# Patient Record
Sex: Male | Born: 1950 | Race: White | Hispanic: No | Marital: Married | Smoking: Former smoker
Health system: Southern US, Community
[De-identification: ages and names within clinical notes are randomized; demographics above are authoritative.]

## PROBLEM LIST (undated history)

## (undated) DIAGNOSIS — C9110 Chronic lymphocytic leukemia of B-cell type not having achieved remission: Secondary | ICD-10-CM

## (undated) DIAGNOSIS — I2699 Other pulmonary embolism without acute cor pulmonale: Secondary | ICD-10-CM

## (undated) DIAGNOSIS — N400 Enlarged prostate without lower urinary tract symptoms: Secondary | ICD-10-CM

## (undated) DIAGNOSIS — I739 Peripheral vascular disease, unspecified: Secondary | ICD-10-CM

## (undated) DIAGNOSIS — I10 Essential (primary) hypertension: Secondary | ICD-10-CM

## (undated) DIAGNOSIS — Z950 Presence of cardiac pacemaker: Secondary | ICD-10-CM

---

## 2013-04-25 ENCOUNTER — Emergency Department (INDEPENDENT_AMBULATORY_CARE_PROVIDER_SITE_OTHER): Payer: 59

## 2013-04-25 ENCOUNTER — Emergency Department (HOSPITAL_COMMUNITY)
Admission: EM | Admit: 2013-04-25 | Discharge: 2013-04-25 | Disposition: A | Discharge status: Home / Self Care | Payer: 59 | Source: Home / Self Care | Attending: Family Medicine | Admitting: Family Medicine

## 2013-04-25 ENCOUNTER — Encounter (HOSPITAL_COMMUNITY): Payer: Self-pay | Admitting: Emergency Medicine

## 2013-04-25 DIAGNOSIS — J40 Bronchitis, not specified as acute or chronic: Secondary | ICD-10-CM

## 2013-04-25 DIAGNOSIS — R030 Elevated blood-pressure reading, without diagnosis of hypertension: Secondary | ICD-10-CM

## 2013-04-25 DIAGNOSIS — IMO0001 Reserved for inherently not codable concepts without codable children: Secondary | ICD-10-CM

## 2013-04-25 MED ORDER — PREDNISONE 50 MG PO TABS: 50.0000 mg | ORAL_TABLET | Freq: Every day | ORAL | Status: DC

## 2013-04-25 MED ORDER — ALBUTEROL SULFATE HFA 108 (90 BASE) MCG/ACT IN AERS
INHALATION_SPRAY | RESPIRATORY_TRACT | Status: AC
  Filled 2013-04-25: qty 6.7

## 2013-04-25 MED ORDER — ALBUTEROL SULFATE HFA 108 (90 BASE) MCG/ACT IN AERS
1.0000 | INHALATION_SPRAY | RESPIRATORY_TRACT | Status: DC | PRN
  Administered 2013-04-25: 2 via RESPIRATORY_TRACT

## 2013-04-25 MED ORDER — AZITHROMYCIN 250 MG PO TABS: 250.0000 mg | ORAL_TABLET | Freq: Every day | ORAL | Status: DC

## 2013-04-25 MED ORDER — GUAIFENESIN-CODEINE 100-10 MG/5ML PO SOLN: 5.0000 mL | Freq: Every evening | ORAL | Status: DC | PRN

## 2013-04-25 NOTE — ED Provider Notes (Addendum)
Douglas [name] is a 63 y.o. male who presents to Urgent Care today for 6-7 days of sinus congestion sore throat body aches and persistent nonproductive cough. The cough is quite bothersome at bedtime. Most of the symptoms have resolved with the exception of cough. He notes a mild shortness of breath as well. He denies any leg swelling palpitations chest pain fevers or chills. He feels well otherwise.   History reviewed. No pertinent past medical history. Patient does not have any medical problems but does not go to doctors. History  Substance Use Topics  . Smoking status: Not on file  . Smokeless tobacco: Not on file  . Alcohol Use: Not on file   currently a smoker. ROS as above Medications: Current Facility-Administered Medications  Medication Dose Route Frequency Provider Last Rate Last Dose  . albuterol (PROVENTIL HFA;VENTOLIN HFA) 108 (90 BASE) MCG/ACT inhaler 1-2 puff  1-2 puff Inhalation Q4H PRN Gregor Hams, MD   2 puff at 04/25/13 0910   Current Outpatient Prescriptions  Medication Sig Dispense Refill  . azithromycin (ZITHROMAX) 250 MG tablet Take 1 tablet (250 mg total) by mouth daily. Take first 2 tablets together, then 1 every day until finished.  6 tablet  0  . guaiFENesin-codeine 100-10 MG/5ML syrup Take 5 mLs by mouth at bedtime as needed for cough.  120 mL  0  . predniSONE (DELTASONE) 50 MG tablet Take 1 tablet (50 mg total) by mouth daily.  5 tablet  0    Exam:  BP 188/90  Pulse 92  Temp(Src) 98.7 F (37.1 C) (Oral)  Resp 18  SpO2 96% Gen: Well NAD HEENT: EOMI,  MMM posterior pharynx with cobblestoning. Tympanic membranes are normal appearing bilaterally Lungs: Normal work of breathing. CTABL no wheezing noted  Heart: RRR no MRG Abd: NABS, Soft. NT, ND Exts: Brisk capillary refill, warm and well perfused.   No results found for this or any previous visit (from the past 24 hour(s)). Dg Chest 2 View  04/25/2013   CLINICAL DATA:  Cough and sore throat with  congestion for 1 week, cough getting worse  EXAM: CHEST  2 VIEW  COMPARISON:  None.  FINDINGS: The heart size and vascular pattern are normal. The right lung is clear. Laterally in the left lower lobe there are mildly prominent well-defined, horizontally oriented interstitial markings. There are no pleural effusions. There is mild perihilar peribronchial wall thickening bilaterally. There is significant degenerative change involving the left shoulder. Right shoulder is not visualized.  IMPRESSION: Findings most consistent with bronchitis. Mild left lower lobe interstitial change is felt to most likely be related to atelectasis. Consider radiographic followup if there is reason to suspect developing pneumonia.   Electronically Signed   By: Skipper Cliche M.D.   On: 04/25/2013 08:48    Assessment and Plan: 63 y.o. male with bronchitis versus early pneumonia. Plan to treat with prednisone, azithromycin, containing cough medication, and albuterol. Will dispense albuterol inhaler in the clinic today prior to discharge and teach patient how to use it. Additionally recommend patient followup with his primary care provider for blood pressure management.  Discussed warning signs or symptoms. Please see discharge instructions. Patient expresses understanding.    Gregor Hams, MD 04/25/13 [place], MD 04/25/13 [phone]

## 2013-04-25 NOTE — Discharge Instructions (Signed)
Thank you for coming in today. Use albuterol as needed. Take prednisone daily for 5 days. Take azithromycin daily for 5 days. Use ibuprofen or Tylenol for pain fevers or chills. Call or go to the emergency room if you get worse, have trouble breathing, have chest pains, or palpitations.   Bronchitis Bronchitis is inflammation of the airways that extend from the windpipe into the lungs (bronchi). The inflammation often causes mucus to develop, which leads to a cough. If the inflammation becomes severe, it may cause shortness of breath. CAUSES  Bronchitis may be caused by:   Viral infections.   Bacteria.   Cigarette smoke.   Allergens, pollutants, and other irritants.  SIGNS AND SYMPTOMS  The most common symptom of bronchitis is a frequent cough that produces mucus. Other symptoms include:  Fever.   Body aches.   Chest congestion.   Chills.   Shortness of breath.   Sore throat.  DIAGNOSIS  Bronchitis is usually diagnosed through a medical history and physical exam. Tests, such as chest X-rays, are sometimes done to rule out other conditions.  TREATMENT  You may need to avoid contact with whatever caused the problem (smoking, for example). Medicines are sometimes needed. These may include:  Antibiotics. These may be prescribed if the condition is caused by bacteria.  Cough suppressants. These may be prescribed for relief of cough symptoms.   Inhaled medicines. These may be prescribed to help open your airways and make it easier for you to breathe.   Steroid medicines. These may be prescribed for those with recurrent (chronic) bronchitis. HOME CARE INSTRUCTIONS  Get plenty of rest.   Drink enough fluids to keep your urine clear or pale yellow (unless you have a medical condition that requires fluid restriction). Increasing fluids may help thin your secretions and will prevent dehydration.   Only take over-the-counter or prescription medicines as directed  by your health care provider.  Only take antibiotics as directed. Make sure you finish them even if you start to feel better.  Avoid secondhand smoke, irritating chemicals, and strong fumes. These will make bronchitis worse. If you are a smoker, quit smoking. Consider using nicotine gum or skin patches to help control withdrawal symptoms. Quitting smoking will help your lungs heal faster.   Put a cool-mist humidifier in your bedroom at night to moisten the air. This may help loosen mucus. Change the water in the humidifier daily. You can also run the hot water in your shower and sit in the bathroom with the door closed for 5 10 minutes.   Follow up with your health care provider as directed.   Wash your hands frequently to avoid catching bronchitis again or spreading an infection to others.  SEEK MEDICAL CARE IF: Your symptoms do not improve after 1 week of treatment.  SEEK IMMEDIATE MEDICAL CARE IF:  Your fever increases.  You have chills.   You have chest pain.   You have worsening shortness of breath.   You have bloody sputum.  You faint.  You have lightheadedness.  You have a severe headache.   You vomit repeatedly. MAKE SURE YOU:   Understand these instructions.  Will watch your condition.  Will get help right away if you are not doing well or get worse. Document Released: 03/28/2005 Document Revised: 01/16/2013 Document Reviewed: 11/20/2012 [facility] Patient Information 2014 [place].

## 2013-04-25 NOTE — ED Notes (Signed)
C/o cold sx for six days which has cleared up but the cough States he is SOB especially at night OTC medication taking but no relief.

## 2013-07-02 ENCOUNTER — Other Ambulatory Visit: Payer: Self-pay | Admitting: Family Medicine

## 2013-07-02 DIAGNOSIS — I10 Essential (primary) hypertension: Secondary | ICD-10-CM

## 2013-07-09 ENCOUNTER — Ambulatory Visit
Admission: RE | Admit: 2013-07-09 | Discharge: 2013-07-09 | Disposition: A | Discharge status: Home / Self Care | Payer: 59 | Source: Ambulatory Visit | Attending: Family Medicine | Admitting: Family Medicine

## 2013-07-09 DIAGNOSIS — I10 Essential (primary) hypertension: Secondary | ICD-10-CM

## 2013-07-18 ENCOUNTER — Emergency Department (HOSPITAL_COMMUNITY)
Admission: EM | Admit: 2013-07-18 | Discharge: 2013-07-18 | Disposition: A | Discharge status: Home / Self Care | Payer: 59 | Source: Home / Self Care | Attending: Emergency Medicine | Admitting: Emergency Medicine

## 2013-07-18 ENCOUNTER — Encounter (HOSPITAL_COMMUNITY): Payer: Self-pay | Admitting: Emergency Medicine

## 2013-07-18 DIAGNOSIS — J208 Acute bronchitis due to other specified organisms: Secondary | ICD-10-CM

## 2013-07-18 DIAGNOSIS — J209 Acute bronchitis, unspecified: Secondary | ICD-10-CM

## 2013-07-18 MED ORDER — IPRATROPIUM BROMIDE 0.06 % NA SOLN: 2.0000 | Freq: Four times a day (QID) | NASAL | Status: DC

## 2013-07-18 MED ORDER — ALBUTEROL SULFATE HFA 108 (90 BASE) MCG/ACT IN AERS: 2.0000 | INHALATION_SPRAY | Freq: Four times a day (QID) | RESPIRATORY_TRACT | Status: DC

## 2013-07-18 MED ORDER — HYDROCOD POLST-CHLORPHEN POLST 10-8 MG/5ML PO LQCR: 5.0000 mL | Freq: Two times a day (BID) | ORAL | Status: DC | PRN

## 2013-07-18 MED ORDER — PREDNISONE 20 MG PO TABS: 20.0000 mg | ORAL_TABLET | Freq: Two times a day (BID) | ORAL | Status: DC

## 2013-07-18 MED ORDER — AZITHROMYCIN 250 MG PO TABS: ORAL_TABLET | ORAL | Status: DC

## 2013-07-18 NOTE — Discharge Instructions (Signed)

## 2013-07-18 NOTE — ED Provider Notes (Signed)
Chief Complaint   Chief Complaint  Patient presents with  . Bronchitis    History of Present Illness   Douglas [name] is a 63 year old male who has had a two-day history of nonproductive cough, wheezing, shortness of breath, nasal congestion, headache, sinus pressure, ear congestion, postnasal drip, has felt somewhat warm, and had a sore throat. He denies any chest pain or GI symptoms. 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  Review of Systems   Other than as noted above, the patient denies any of the following symptoms: Systemic:  No fevers, chills, sweats, or myalgias. Eye:  No redness or discharge. ENT:  No ear pain, headache, nasal congestion, drainage, sinus pressure, or sore throat. Neck:  No neck pain, stiffness, or swollen glands. Lungs:  No cough, sputum production, hemoptysis, wheezing, chest tightness, shortness of breath or chest pain. GI:  No abdominal pain, nausea, vomiting or diarrhea.  [place]   Past medical history, family history, social history, meds, and allergies were reviewed. He takes several medicines for high blood pressure.  Physical exam   Vital signs:  BP 176/89  Pulse 90  Temp(Src) 98.2 F (36.8 C) (Oral)  Resp 16  SpO2 95% General:  Alert and oriented.  In no distress.  Skin warm and dry. Eye:  No conjunctival injection or drainage. Lids were normal. ENT:  TMs and canals were normal, without erythema or inflammation.  Nasal mucosa was clear and uncongested, without drainage.  Mucous membranes were moist.  Pharynx was clear with no exudate or drainage.  There were no oral ulcerations or lesions. Neck:  Supple, no adenopathy, tenderness or mass. Lungs:  No respiratory distress.  Lungs were clear to auscultation, without wheezes, rales or rhonchi.  Breath sounds were clear and equal bilaterally.  Heart:  Regular rhythm, without gallops,  murmers or rubs. Skin:  Clear, warm, and dry, without rash or lesions.  Assessment     The encounter diagnosis was Viral bronchitis.  No evidence of pneumonia.  Plan    1.  Meds:  The following meds were prescribed:   Discharge Medication List as of 07/18/2013  8:43 AM    START taking these medications   Details  albuterol (PROVENTIL HFA;VENTOLIN HFA) 108 (90 BASE) MCG/ACT inhaler Inhale 2 puffs into the lungs 4 (four) times daily., Starting 07/18/2013, Until Discontinued, Normal    !! azithromycin (ZITHROMAX Z-PAK) 250 MG tablet Take as directed., Print    chlorpheniramine-HYDROcodone (TUSSIONEX) 10-8 MG/5ML LQCR Take 5 mLs by mouth every 12 (twelve) hours as needed for cough., Starting 07/18/2013, Until Discontinued, Normal    ipratropium (ATROVENT) 0.06 % nasal spray Place 2 sprays into both nostrils 4 (four) times daily., Starting 07/18/2013, Until Discontinued, Normal    !! predniSONE (DELTASONE) 20 MG tablet Take 1 tablet (20 mg total) by mouth 2 (two) times daily., Starting 07/18/2013, Until Discontinued, Normal     !! - Potential duplicate medications found. Please discuss with provider.     The patient was told not to get the prescription for antibiotic filled unless his respiratory symptoms had persisted for more than 7 to 10 days.  2.  Patient Education/Counseling:  The patient was given appropriate handouts, self care instructions, and instructed in symptomatic relief.  Instructed to get extra fluids, rest, and use a cool mist vaporizer.    3.  Follow up:  The patient was told  to follow up here if no better in 3 to 4 days, or sooner if becoming worse in any way, and given some red flag symptoms such as increasing fever, difficulty breathing, chest pain, or persistent vomiting which would prompt immediate return.  Follow up here as needed.      Harden Mo, MD 07/18/13 [phone]

## 2013-07-18 NOTE — ED Notes (Signed)
C/o bronchitis States she has SOB, cough, sinus drainage and congestion States ear has discomfort down to neck Was here in January for same issue

## 2014-02-27 ENCOUNTER — Ambulatory Visit
Admission: RE | Admit: 2014-02-27 | Discharge: 2014-02-27 | Disposition: A | Discharge status: Home / Self Care | Payer: 59 | Source: Ambulatory Visit | Attending: Family Medicine | Admitting: Family Medicine

## 2014-02-27 ENCOUNTER — Other Ambulatory Visit: Payer: Self-pay | Admitting: Family Medicine

## 2014-02-27 DIAGNOSIS — R059 Cough, unspecified: Secondary | ICD-10-CM

## 2014-02-27 DIAGNOSIS — R05 Cough: Secondary | ICD-10-CM

## 2016-07-12 ENCOUNTER — Encounter: Payer: Self-pay | Admitting: Podiatry

## 2016-07-12 ENCOUNTER — Ambulatory Visit (INDEPENDENT_AMBULATORY_CARE_PROVIDER_SITE_OTHER): Payer: 59 | Admitting: Podiatry

## 2016-07-12 ENCOUNTER — Ambulatory Visit: Payer: 59

## 2016-07-12 VITALS — BP 145/72 | HR 81 | Resp 16 | Ht 72.0 in | Wt 220.0 lb

## 2016-07-12 DIAGNOSIS — M779 Enthesopathy, unspecified: Secondary | ICD-10-CM

## 2016-07-12 DIAGNOSIS — L84 Corns and callosities: Secondary | ICD-10-CM

## 2016-07-12 DIAGNOSIS — M79672 Pain in left foot: Secondary | ICD-10-CM

## 2016-07-12 MED ORDER — TRIAMCINOLONE ACETONIDE 10 MG/ML IJ SUSP
10.0000 mg | Freq: Once | INTRAMUSCULAR | Status: AC
  Administered 2016-07-12: 10 mg

## 2016-07-12 NOTE — Progress Notes (Signed)
   Subjective:    Patient ID: Douglas [name], male    DOB: 1950/06/25, 66 y.o.   MRN: [id number]  HPI Chief Complaint  Patient presents with  . Painful lesion    Left foot; midfoot-lateral side; x4-6 weeks      Review of Systems  HENT: Positive for sinus pain.   Eyes: Positive for redness and itching.  Respiratory: Positive for shortness of breath.   Genitourinary: Positive for difficulty urinating, frequency and urgency.  Musculoskeletal: Positive for back pain and myalgias.  Neurological: Positive for light-headedness.  All other systems reviewed and are negative.      Objective:   Physical Exam        Assessment & Plan:

## 2016-07-18 NOTE — Progress Notes (Signed)
Subjective:     Patient ID: Douglas [name], male   DOB: 1951/01/05, 66 y.o.   MRN: [id number]  HPI patient presents stating she's getting a lot of pain at the base of her fifth metatarsal left foot making it hard to walk comfortably   Review of Systems  All other systems reviewed and are negative.      Objective:   Physical Exam  Constitutional: He is oriented to person, place, and time.  Cardiovascular: Intact distal pulses.   Musculoskeletal: Normal range of motion.  Neurological: He is oriented to person, place, and time.  Skin: Skin is warm.  Nursing note and vitals reviewed.  neurovascular status intact muscle strength adequate range of motion within normal limits with patient noted to have pain in the base of the fifth metatarsal left with inflammation fluid noted. There is also keratotic lesion with deep type lucent core that is very painful when pressed and patient is noted to have good digital perfusion and is well oriented 3     Assessment:     Inflammatory capsulitis base of fifth metatarsal left with porokeratotic type lesion    Plan:     H&P condition reviewed and treatment rendered. Today I went ahead and I did a careful steroidal injection base of fifth metatarsal to reduce the inflammatory process and after appropriate numbness debrided the lesion fully and applied padding. Reappoint as symptoms indicate  X-rays indicate that there is no signs of bone pathology appears to be soft tissue

## 2018-11-12 ENCOUNTER — Encounter: Payer: Self-pay | Admitting: Oncology

## 2018-11-12 ENCOUNTER — Telehealth: Payer: Self-pay | Admitting: Oncology

## 2018-11-12 NOTE — Telephone Encounter (Signed)
Received a new hem referral from Dr. Orland Mustard for lymphocytosis and thrombocytopenia. Pt has been cld and scheduled to see Dr. Alen Blew on 8/19 at 11am. Letter mailed.

## 2018-11-28 ENCOUNTER — Telehealth: Payer: Self-pay | Admitting: Oncology

## 2018-11-28 ENCOUNTER — Inpatient Hospital Stay: Payer: 59 | Attending: Oncology | Admitting: Oncology

## 2018-11-28 ENCOUNTER — Other Ambulatory Visit: Payer: Self-pay

## 2018-11-28 ENCOUNTER — Inpatient Hospital Stay: Payer: 59

## 2018-11-28 VITALS — BP 141/81 | HR 97 | Temp 98.9°F | Resp 18 | Ht 72.0 in | Wt 216.5 lb

## 2018-11-28 DIAGNOSIS — D72820 Lymphocytosis (symptomatic): Secondary | ICD-10-CM | POA: Insufficient documentation

## 2018-11-28 DIAGNOSIS — Z87891 Personal history of nicotine dependence: Secondary | ICD-10-CM | POA: Diagnosis not present

## 2018-11-28 DIAGNOSIS — D72829 Elevated white blood cell count, unspecified: Secondary | ICD-10-CM

## 2018-11-28 DIAGNOSIS — I10 Essential (primary) hypertension: Secondary | ICD-10-CM | POA: Diagnosis not present

## 2018-11-28 DIAGNOSIS — Z806 Family history of leukemia: Secondary | ICD-10-CM | POA: Diagnosis not present

## 2018-11-28 DIAGNOSIS — D696 Thrombocytopenia, unspecified: Secondary | ICD-10-CM | POA: Diagnosis not present

## 2018-11-28 LAB — CMP (CANCER CENTER ONLY)
ALT: 21 U/L (ref 0–44)
AST: 13 U/L — ABNORMAL LOW (ref 15–41)
Albumin: 4.2 g/dL (ref 3.5–5.0)
Alkaline Phosphatase: 81 U/L (ref 38–126)
Anion gap: 12 (ref 5–15)
BUN: 19 mg/dL (ref 8–23)
CO2: 25 mmol/L (ref 22–32)
Calcium: 9.3 mg/dL (ref 8.9–10.3)
Chloride: 103 mmol/L (ref 98–111)
Creatinine: 0.85 mg/dL (ref 0.61–1.24)
GFR, Est AFR Am: >60 mL/min (ref >60)
GFR, Estimated: >60 mL/min (ref >60)
Glucose, Bld: 95 mg/dL (ref 70–99)
Potassium: 3.7 mmol/L (ref 3.5–5.1)
Sodium: 140 mmol/L (ref 135–145)
Total Bilirubin: 0.3 mg/dL (ref 0.3–1.2)
Total Protein: 7.1 g/dL (ref 6.5–8.1)

## 2018-11-28 LAB — CBC WITH DIFFERENTIAL (CANCER CENTER ONLY)
Abs Immature Granulocytes: 0.05 10*3/uL (ref 0.00–0.07)
Basophils Absolute: 0.1 10*3/uL (ref 0.0–0.1)
Basophils Relative: 0 %
Eosinophils Absolute: 0.1 10*3/uL (ref 0.0–0.5)
Eosinophils Relative: 0 %
HCT: 43 % (ref 39.0–52.0)
Hemoglobin: 14.1 g/dL (ref 13.0–17.0)
Immature Granulocytes: 0 %
Lymphocytes Relative: 76 %
Lymphs Abs: 18.5 10*3/uL — ABNORMAL HIGH (ref 0.7–4.0)
MCH: 30.1 pg (ref 26.0–34.0)
MCHC: 32.8 g/dL (ref 30.0–36.0)
MCV: 91.9 fL (ref 80.0–100.0)
Monocytes Absolute: 1.8 10*3/uL — ABNORMAL HIGH (ref 0.1–1.0)
Monocytes Relative: 7 %
Neutro Abs: 4.3 10*3/uL (ref 1.7–7.7)
Neutrophils Relative %: 17 %
Platelet Count: 139 10*3/uL — ABNORMAL LOW (ref 150–400)
RBC: 4.68 MIL/uL (ref 4.22–5.81)
RDW: 17.7 % — ABNORMAL HIGH (ref 11.5–15.5)
WBC Count: 24.9 10*3/uL — ABNORMAL HIGH (ref 4.0–10.5)
nRBC: 0 % (ref 0.0–0.2)

## 2018-11-28 NOTE — Progress Notes (Signed)
Reason for the request:   Lymphocytosis and lymphadenopathy  HPI: I was asked by Dr. Orland Mustard to evaluate Douglas [name] for lymphocytosis.  He is a 68 year old man with history of hypertension and allergies but otherwise in reasonable health and shape.  He has a routine follow-up with Dr. Orland Mustard for physical and recently noted to have cervical adenopathy.  Upon further evaluation a CBC obtained on October 30, 2018 which showed a white cell count of 18,000 with lymphocytosis lymphocyte percentage of 74%.  His platelets were at 105 and normal hemoglobin.  The differential showed neutrophil percentage 24%.  He denies any specific symptoms related to these findings.  He denies any other painful adenopathy, constitutional symptoms including fevers chills or night sweats.  He denies any decline in his energy fatigue.  He denies any weight loss or early satiety.  He remains active and is able to attend to activities of daily living.  He denies any previous hospitalizations or illnesses.  He does have seasonal allergies that have been manageable for the most part.  He does not report any headaches, blurry vision, syncope or seizures. Does not report any fevers, chills or sweats.  Does not report any cough, wheezing or hemoptysis.  Does not report any chest pain, palpitation, orthopnea or leg edema.  Does not report any nausea, vomiting or abdominal pain.  Does not report any constipation or diarrhea.  Does not report any skeletal complaints.    Does not report frequency, urgency or hematuria.  Does not report any skin rashes or lesions. Does not report any heat or cold intolerance.  Does not report any lymphadenopathy or petechiae.  Does not report any anxiety or depression.  Remaining review of systems is negative.    Medical history significant for hypertension allergies.  No history of diabetes, coronary disease or malignancy.  Current Outpatient Medications:  .  albuterol (PROVENTIL HFA;VENTOLIN HFA) 108 (90  BASE) MCG/ACT inhaler, Inhale 2 puffs into the lungs 4 (four) times daily., Disp: 1 Inhaler, Rfl: 0 .  amLODipine (NORVASC) 5 MG tablet, Take 5 mg by mouth daily., Disp: , Rfl:  .  azelastine (ASTELIN) 0.1 % nasal spray, Place 1 spray into both nostrils as needed for rhinitis. Use in each nostril as directed, Disp: , Rfl:  .  loratadine (CLARITIN) 10 MG tablet, Take 10 mg by mouth daily., Disp: , Rfl:  .  montelukast (SINGULAIR) 10 MG tablet, Take 10 mg by mouth as needed., Disp: , Rfl:  .  terazosin (HYTRIN) 10 MG capsule, Take 10 mg by mouth at bedtime., Disp: , Rfl:  .  valsartan-hydrochlorothiazide (DIOVAN-HCT) 320-25 MG tablet, Take 1 tablet by mouth daily., Disp: , Rfl: :  No Known Allergies:   No family history of malignancy.  His grandfather had leukemia although specifics are unclear. Social History   Socioeconomic History  . Marital status: Married    Spouse name: Not on file  . Number of children: Not on file  . Years of education: Not on file  . Highest education level: Not on file  Occupational History  . Not on file  Social Needs  . Financial resource strain: Not on file  . Food insecurity    Worry: Not on file    Inability: Not on file  . Transportation needs    Medical: Not on file    Non-medical: Not on file  Tobacco Use  . Smoking status: Former Research scientist (life sciences)  . Smokeless tobacco: Never Used  Substance and Sexual Activity  .  Alcohol use: Not on file  . Drug use: Not on file  . Sexual activity: Not on file  Lifestyle  . Physical activity    Days per week: Not on file    Minutes per session: Not on file  . Stress: Not on file  Relationships  . Social Herbalist on phone: Not on file    Gets together: Not on file    Attends religious service: Not on file    Active member of club or organization: Not on file    Attends meetings of clubs or organizations: Not on file    Relationship status: Not on file  . Intimate partner violence    Fear of current  or ex partner: Not on file    Emotionally abused: Not on file    Physically abused: Not on file    Forced sexual activity: Not on file  Other Topics Concern  . Not on file  Social History Narrative  . Not on file  :  Pertinent items are noted in HPI.  Exam: Blood pressure (!) 141/81, pulse 97, temperature 98.9 F (37.2 C), temperature source Temporal, resp. rate 18, height 6' (1.829 m), weight 216 lb 8 oz (98.2 kg), SpO2 98 %.  ECOG 0 General appearance: alert and cooperative appeared without distress. Head: atraumatic without any abnormalities. Eyes: conjunctivae/corneas clear. PERRL.  Sclera anicteric. Throat: lips, mucosa, and tongue normal; without oral thrush or ulcers. Resp: clear to auscultation bilaterally without rhonchi, wheezes or dullness to percussion. Cardio: regular rate and rhythm, S1, S2 normal, no murmur, click, rub or gallop GI: soft, non-tender; bowel sounds normal; no masses,  no organomegaly Skin: Skin color, texture, turgor normal. No rashes or lesions Lymph nodes: Bilateral enlarged cervical adenopathy palpated.  No axillary, inguinal or supraclavicular lymph nodes. Neurologic: Grossly normal without any motor, sensory or deep tendon reflexes. Musculoskeletal: No joint deformity or effusion.  Recent Labs    11/28/18 1119  WBC 24.9*  HGB 14.1  HCT 43.0  PLT 139*     Assessment and Plan:   68 year old with:  1.  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  He was found to have white cell count of 18,000 with lymphocytosis and thrombocytopenia with normal hemoglobin.  Repeat CBC today showed white cell count of 24.9 with hemoglobin of 14 and platelet count of 139.  The differential diagnosis was discussed today with the patient and lymphoproliferative disorder appears to be the most likely diagnosis.  The most common condition would be CLL although other lymphoproliferative disorders are considered possibilities as well.  These would  include mantle cell lymphoma among others.  To complete the evaluation I will obtain a peripheral blood flow cytometry as well as a CLL panel in case we are dealing with this condition for prognostication and treatment purposes.  The need for CT scan to complete the staging purposes was discussed today.  CT scan will give [place] a better understanding of the extent of his lymphadenopathy and the bulk of the disease.  The need for bone marrow biopsy could also arise as well.  After discussion today, he is agreeable to proceed with laboratory work-up today but would like to defer CT scan for the time being.  I will communicate with him the results of the initial blood testing as well as the need for subsequent imaging studies.  I continue to emphasize the importance of obtaining imaging studies however to complete the staging.  Treatment options for  this condition will be discussed further once diagnosis is formally made.  We did discuss treatment options for CLL in case that we are dealing with.  Statistically remains the most common lymphoproliferative disorder.  Treatment options include systemic chemotherapy, immunotherapy as well as oral targeted therapy with ibrutinib will also reviewed.  Complications with these treatments were also reviewed.  2.  Thrombocytopenia: Appears to be mild and not symptomatic.  He has no active bleeding noted at this time.  Thrombocytopenia is likely related to a lymphoproliferative disorder does not require treatment at the current rate.  3.  Follow-up: Will be determined pending the results of his work-up.  60  minutes was spent with the patient face-to-face today.  More than 50% of time was spent on reviewing laboratory data, differential diagnosis, management options and future plan of care..    Thank you for the referral.  A copy of this consult has been forwarded to the requesting physician.

## 2018-11-28 NOTE — Telephone Encounter (Signed)
Gave avs and calendar ° °

## 2018-11-30 LAB — FLOW CYTOMETRY

## 2018-12-14 LAB — FISH,CLL PROGNOSTIC PANEL

## 2018-12-24 ENCOUNTER — Inpatient Hospital Stay (HOSPITAL_BASED_OUTPATIENT_CLINIC_OR_DEPARTMENT_OTHER): Payer: 59 | Admitting: Oncology

## 2018-12-24 ENCOUNTER — Inpatient Hospital Stay: Payer: 59 | Attending: Oncology

## 2018-12-24 ENCOUNTER — Other Ambulatory Visit: Payer: Self-pay

## 2018-12-24 VITALS — BP 133/70 | HR 85 | Temp 98.2°F | Resp 18 | Ht 72.0 in | Wt 222.6 lb

## 2018-12-24 DIAGNOSIS — C9110 Chronic lymphocytic leukemia of B-cell type not having achieved remission: Secondary | ICD-10-CM | POA: Insufficient documentation

## 2018-12-24 DIAGNOSIS — D72829 Elevated white blood cell count, unspecified: Secondary | ICD-10-CM | POA: Diagnosis not present

## 2018-12-24 DIAGNOSIS — D696 Thrombocytopenia, unspecified: Secondary | ICD-10-CM | POA: Diagnosis not present

## 2018-12-24 LAB — CBC WITH DIFFERENTIAL (CANCER CENTER ONLY)
Abs Immature Granulocytes: 0.07 10*3/uL (ref 0.00–0.07)
Basophils Absolute: 0.1 10*3/uL (ref 0.0–0.1)
Basophils Relative: 0 %
Eosinophils Absolute: 0.2 10*3/uL (ref 0.0–0.5)
Eosinophils Relative: 1 %
HCT: 40.8 % (ref 39.0–52.0)
Hemoglobin: 13.5 g/dL (ref 13.0–17.0)
Immature Granulocytes: 0 %
Lymphocytes Relative: 77 %
Lymphs Abs: 22.1 10*3/uL — ABNORMAL HIGH (ref 0.7–4.0)
MCH: 30.3 pg (ref 26.0–34.0)
MCHC: 33.1 g/dL (ref 30.0–36.0)
MCV: 91.7 fL (ref 80.0–100.0)
Monocytes Absolute: 1.8 10*3/uL — ABNORMAL HIGH (ref 0.1–1.0)
Monocytes Relative: 6 %
Neutro Abs: 4.5 10*3/uL (ref 1.7–7.7)
Neutrophils Relative %: 16 %
Platelet Count: 113 10*3/uL — ABNORMAL LOW (ref 150–400)
RBC: 4.45 MIL/uL (ref 4.22–5.81)
RDW: 18.3 % — ABNORMAL HIGH (ref 11.5–15.5)
WBC Count: 28.8 10*3/uL — ABNORMAL HIGH (ref 4.0–10.5)
nRBC: 0 % (ref 0.0–0.2)

## 2018-12-24 NOTE — Progress Notes (Signed)
Hematology and Oncology Follow Up Visit  Douglas [name] [id number] 1950-08-04 68 y.o. 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  He presented with lymphadenopathy and lymphocytosis.  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   Current therapy: Under consideration for therapy versus observation.  Interim History: Douglas [name] returns today for a follow-up visit.  Since the last visit, he reports no major changes in his health.  He denies any recent hospitalization or illnesses.  He denies any nausea or abdominal pain.  He denies any bleeding complications.  Patient denied any alteration mental status, neuropathy, confusion or dizziness.  Denies any headaches or lethargy.  Denies any night sweats, weight loss or changes in appetite.  Denied orthopnea, dyspnea on exertion or chest discomfort.  Denies shortness of breath, difficulty breathing hemoptysis or cough.  Denies any abdominal distention, nausea, early satiety or dyspepsia.  Denies any hematuria, frequency, dysuria or nocturia.  Denies any skin irritation, dryness or rash.  Denies any ecchymosis or petechiae.  Denies any lymphadenopathy or clotting.  Denies any heat or cold intolerance.  Denies any anxiety or depression.  Remaining review of system is negative.        Medications: I have reviewed the patient's current medications.  Current Outpatient Medications  Medication Sig Dispense Refill  . albuterol (PROVENTIL HFA;VENTOLIN HFA) 108 (90 BASE) MCG/ACT inhaler Inhale 2 puffs into the lungs 4 (four) times daily. 1 Inhaler 0  . amLODipine (NORVASC) 5 MG tablet Take 5 mg by mouth daily.    [name] azelastine (ASTELIN) 0.1 % nasal spray Place 1 spray into both nostrils as needed for rhinitis. Use in each nostril as directed    .  loratadine (CLARITIN) 10 MG tablet Take 10 mg by mouth daily.    . montelukast (SINGULAIR) 10 MG tablet Take 10 mg by mouth as needed.    . terazosin (HYTRIN) 10 MG capsule Take 10 mg by mouth at bedtime.    . valsartan-hydrochlorothiazide (DIOVAN-HCT) 320-25 MG tablet Take 1 tablet by mouth daily.     No current facility-administered medications for this visit.      Allergies: No Known Allergies  Past Medical History, Surgical history, Social history, and Family History were reviewed and updated.    Physical Exam: Blood pressure 133/70, pulse 85, temperature 98.2 F (36.8 C), temperature source Oral, resp. rate 18, height 6' (1.829 m), weight 222 lb 9.6 oz (101 kg), SpO2 99 %.   ECOG: 0   General appearance: Comfortable appearing without any discomfort Head: Normocephalic without any trauma Oropharynx: Mucous membranes are moist and pink without any thrush or ulcers. Eyes: Pupils are equal and round reactive to light. Lymph nodes:  Bilateral cervical adenopathy unchanged. Heart:regular rate and rhythm.  S1 and S2 without leg edema. Lung: Clear without any rhonchi or wheezes.  No dullness to percussion. Abdomin: Soft, nontender, nondistended with good bowel sounds.  No hepatosplenomegaly. Musculoskeletal: No joint deformity or effusion.  Full range of motion noted. Neurological: No deficits noted on motor, sensory and deep tendon reflex exam. Skin: No petechial rash or dryness.  Appeared moist.      Lab Results: Lab Results  Component Value Date   WBC 24.9 (H) 11/28/2018   HGB 14.1 11/28/2018   HCT 43.0 11/28/2018   MCV 91.9 11/28/2018   PLT 139 (L)  11/28/2018     Chemistry      Component Value Date/Time   NA 140 11/28/2018 1119   K 3.7 11/28/2018 1119   CL 103 11/28/2018 1119   CO2 25 11/28/2018 1119   BUN 19 11/28/2018 1119   CREATININE 0.85 11/28/2018 1119      Component Value Date/Time   CALCIUM 9.3 11/28/2018 1119   ALKPHOS 81 11/28/2018 1119   AST 13  (L) 11/28/2018 1119   ALT 21 11/28/2018 1119   BILITOT 0.3 11/28/2018 1119      Impression and Plan:   68 year old with:  1.  CLL diagnosed in August 2020 after presenting with cervical adenopathy and lymphocytosis.  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  The natural course of this disease as well as treatment options were reviewed.  This would include active surveillance, single agent ibrutinib, combination with chemoimmunotherapy among other options were reviewed.  Risks and benefits of ibrutinib therapy was reviewed.  Potential complications include bleeding, cardiac arrhythmia bruising among others were reviewed.  After discussion today, we have elected to continue to hold off any treatment for the immediate future and monitor closely.  Will have low threshold of treating him.   2.  Thrombocytopenia: His platelet count is mildly low and asymptomatic.  We will continue to monitor in the setting of CLL.  He would be susceptible to developing immune thrombocytopenia.  3.  Follow-up: In 4 weeks for repeat evaluation.   25  minutes was spent with the patient face-to-face today.  More than 50% of time was spent on reviewing laboratory data, differential diagnosis and management options.   [name], MD 9/14/20209:24 AM

## 2018-12-25 ENCOUNTER — Telehealth: Payer: Self-pay | Admitting: Oncology

## 2018-12-25 NOTE — Telephone Encounter (Signed)
Called and spoke with patient. Confirmed appt  °

## 2019-01-08 ENCOUNTER — Telehealth: Payer: Self-pay | Admitting: Oncology

## 2019-01-08 NOTE — Telephone Encounter (Signed)
Patient called to reschedule 10/19 lab/fu to late December.  Confirmed new lab/fu for 12/29.

## 2019-01-28 ENCOUNTER — Other Ambulatory Visit: Payer: 59

## 2019-01-28 ENCOUNTER — Ambulatory Visit: Payer: 59 | Admitting: Oncology

## 2019-04-09 ENCOUNTER — Other Ambulatory Visit: Payer: Self-pay

## 2019-04-09 ENCOUNTER — Inpatient Hospital Stay: Payer: 59 | Attending: Oncology

## 2019-04-09 ENCOUNTER — Inpatient Hospital Stay (HOSPITAL_BASED_OUTPATIENT_CLINIC_OR_DEPARTMENT_OTHER): Payer: 59 | Admitting: Oncology

## 2019-04-09 VITALS — BP 131/71 | HR 91 | Temp 97.4°F | Resp 18 | Wt 222.6 lb

## 2019-04-09 DIAGNOSIS — C9110 Chronic lymphocytic leukemia of B-cell type not having achieved remission: Secondary | ICD-10-CM | POA: Insufficient documentation

## 2019-04-09 DIAGNOSIS — D6959 Other secondary thrombocytopenia: Secondary | ICD-10-CM | POA: Insufficient documentation

## 2019-04-09 DIAGNOSIS — D72829 Elevated white blood cell count, unspecified: Secondary | ICD-10-CM

## 2019-04-09 LAB — CMP (CANCER CENTER ONLY)
ALT: 17 U/L (ref 0–44)
AST: 10 U/L — ABNORMAL LOW (ref 15–41)
Albumin: 4 g/dL (ref 3.5–5.0)
Alkaline Phosphatase: 75 U/L (ref 38–126)
Anion gap: 12 (ref 5–15)
BUN: 17 mg/dL (ref 8–23)
CO2: 26 mmol/L (ref 22–32)
Calcium: 9.1 mg/dL (ref 8.9–10.3)
Chloride: 100 mmol/L (ref 98–111)
Creatinine: 0.93 mg/dL (ref 0.61–1.24)
GFR, Est AFR Am: >60 mL/min (ref >60)
GFR, Estimated: >60 mL/min (ref >60)
Glucose, Bld: 146 mg/dL — ABNORMAL HIGH (ref 70–99)
Potassium: 4.3 mmol/L (ref 3.5–5.1)
Sodium: 138 mmol/L (ref 135–145)
Total Bilirubin: 0.4 mg/dL (ref 0.3–1.2)
Total Protein: 6.9 g/dL (ref 6.5–8.1)

## 2019-04-09 LAB — CBC WITH DIFFERENTIAL (CANCER CENTER ONLY)
Abs Immature Granulocytes: 0 10*3/uL (ref 0.00–0.07)
Basophils Absolute: 0 10*3/uL (ref 0.0–0.1)
Basophils Relative: 0 %
Eosinophils Absolute: 0.3 10*3/uL (ref 0.0–0.5)
Eosinophils Relative: 1 %
HCT: 42.1 % (ref 39.0–52.0)
Hemoglobin: 13.8 g/dL (ref 13.0–17.0)
Lymphocytes Relative: 86 %
Lymphs Abs: 26.7 10*3/uL — ABNORMAL HIGH (ref 0.7–4.0)
MCH: 30.7 pg (ref 26.0–34.0)
MCHC: 32.8 g/dL (ref 30.0–36.0)
MCV: 93.6 fL (ref 80.0–100.0)
Monocytes Absolute: 0.3 10*3/uL (ref 0.1–1.0)
Monocytes Relative: 1 %
Neutro Abs: 3.7 10*3/uL (ref 1.7–7.7)
Neutrophils Relative %: 12 %
Platelet Count: 102 10*3/uL — ABNORMAL LOW (ref 150–400)
RBC: 4.5 MIL/uL (ref 4.22–5.81)
RDW: 17.8 % — ABNORMAL HIGH (ref 11.5–15.5)
WBC Count: 31 10*3/uL — ABNORMAL HIGH (ref 4.0–10.5)
nRBC: 0 % (ref 0.0–0.2)

## 2019-04-09 NOTE — Progress Notes (Signed)
Hematology and Oncology Follow Up Visit  Douglas [name] [id number] Nov 23, 1950 68 y.o. 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   Current therapy: Active surveillance.  Interim History: Douglas [name] is here for return evaluation.  Since the last visit, he reports no major changes in his health.  He denies any abdominal pain, nausea or weight loss.  His performance status and quality of life remains unchanged.  He does report cervical adenopathy which has not changed.  Denies any dysphagia or swallowing difficulties.  Denies any excessive fatigue or tiredness.  He denied headaches, blurry vision, syncope or seizures.  Denies any fevers, chills or sweats.  Denied chest pain, palpitation, orthopnea or leg edema.  Denied cough, wheezing or hemoptysis.  Denied nausea, vomiting or abdominal pain.  Denies any constipation or diarrhea.  Denies any frequency urgency or hesitancy.  Denies any arthralgias or myalgias.  Denies any skin rashes or lesions.  Denies any bleeding or clotting tendency.  Denies any easy bruising.  Denies any hair or nail changes.  Denies any anxiety or depression.  Remaining review of system is negative.          Medications: Updated without any changes. Current Outpatient Medications  Medication Sig Dispense Refill  . albuterol (PROVENTIL HFA;VENTOLIN HFA) 108 (90 BASE) MCG/ACT inhaler Inhale 2 puffs into the lungs 4 (four) times daily. 1 Inhaler 0  . amLODipine (NORVASC) 5 MG tablet Take 5 mg by mouth daily.    [name] azelastine (ASTELIN) 0.1 % nasal spray Place 1 spray into both nostrils as needed for rhinitis. Use in each nostril as directed    . loratadine (CLARITIN) 10 MG tablet Take 10 mg by mouth daily.    . montelukast (SINGULAIR) 10 MG  tablet Take 10 mg by mouth as needed.    . terazosin (HYTRIN) 10 MG capsule Take 10 mg by mouth at bedtime.    . valsartan-hydrochlorothiazide (DIOVAN-HCT) 320-25 MG tablet Take 1 tablet by mouth daily.     No current facility-administered medications for this visit.     Allergies: No Known Allergies  Past Medical History, Surgical history, Social history, and Family History reviewed without any changes.    Physical Exam:  Blood pressure 131/71, pulse 91, temperature (!) 97.4 F (36.3 C), temperature source Temporal, resp. rate 18, weight 222 lb 9.6 oz (101 kg), SpO2 96 %.    ECOG: 0     General appearance: Alert, awake without any distress. Head: Atraumatic without abnormalities Oropharynx: Without any thrush or ulcers. Eyes: No scleral icterus. Lymph nodes: No lymphadenopathy noted in the cervical, supraclavicular, or axillary nodes Heart:regular rate and rhythm, without any murmurs or gallops.   Lung: Clear to auscultation without any rhonchi, wheezes or dullness to percussion. Abdomin: Soft, nontender without any shifting dullness or ascites. Musculoskeletal: No clubbing or cyanosis. Neurological: No motor or sensory deficits. Skin: No rashes or lesions. Psychiatric: Mood and affect appeared normal.      Lab Results: Lab Results  Component Value Date   WBC 31.0 (H) 04/09/2019   HGB 13.8 04/09/2019   HCT 42.1 04/09/2019   MCV 93.6 04/09/2019   PLT 102 (L) 04/09/2019     Chemistry      Component Value Date/Time   NA 140 11/28/2018 1119   K 3.7 11/28/2018  1119   CL 103 11/28/2018 1119   CO2 25 11/28/2018 1119   BUN 19 11/28/2018 1119   CREATININE 0.85 11/28/2018 1119      Component Value Date/Time   CALCIUM 9.3 11/28/2018 1119   ALKPHOS 81 11/28/2018 1119   AST 13 (L) 11/28/2018 1119   ALT 21 11/28/2018 1119   BILITOT 0.3 11/28/2018 1119      Impression and Plan:   68 year old with:  1.  CLL presented with lymphocytosis and lymphadenopathy  in August 2020.  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  He remains on active surveillance at this time without any indication for treatment and symptoms.  Laboratory data from today showed white cell count is not dramatically different than September 2020.  Risks and benefits of initiating treatment was discussed today.  These options include oral targeted therapy with ibrutinib versus chemotherapy.  Indication for treatment would include rapid lymphocytosis rise, painful adenopathy, bone marrow failure with cytopenias among others including constitutional symptoms.    Complication related to active therapy for CLL reviewed.  Complication related to ibrutinib that include bruising, bleeding, cardiac toxicities.  Nausea vomiting myelosuppression associated with chemotherapy.  After discussion today, he opted to continue with active surveillance although he understands risk of developing symptomatic disease is high and at that time he will require immediate treatment.   2.  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  No active bleeding noted at this time.  3.  Follow-up: In 3 months for repeat evaluation.   25  minutes was spent with the patient face-to-face today.  More than 50% of time was dedicated to updating his disease status, reviewing laboratory data, indication for treatment and complication related to therapy.   [name], MD 12/29/20208:03 AM

## 2019-04-10 ENCOUNTER — Telehealth: Payer: Self-pay | Admitting: Oncology

## 2019-04-10 NOTE — Telephone Encounter (Signed)
Scheduled appt per 12/29 los.  Sent a message to HIM pool to get a calendar mailed out. 

## 2019-05-10 ENCOUNTER — Telehealth: Payer: Self-pay | Admitting: *Deleted

## 2019-05-10 NOTE — Telephone Encounter (Signed)
Records faxed to [place] to att Dr. Orland Mustard - release [id number]

## 2019-07-09 ENCOUNTER — Other Ambulatory Visit: Payer: 59

## 2019-07-09 ENCOUNTER — Ambulatory Visit: Payer: 59 | Admitting: Oncology

## 2019-11-20 ENCOUNTER — Inpatient Hospital Stay: Payer: 59 | Attending: Oncology

## 2019-11-20 ENCOUNTER — Telehealth: Payer: Self-pay | Admitting: Pharmacist

## 2019-11-20 ENCOUNTER — Telehealth: Payer: Self-pay | Admitting: Oncology

## 2019-11-20 ENCOUNTER — Telehealth: Payer: Self-pay | Admitting: Pharmacy Technician

## 2019-11-20 ENCOUNTER — Other Ambulatory Visit: Payer: Self-pay

## 2019-11-20 ENCOUNTER — Inpatient Hospital Stay (HOSPITAL_BASED_OUTPATIENT_CLINIC_OR_DEPARTMENT_OTHER): Payer: 59 | Admitting: Oncology

## 2019-11-20 VITALS — BP 129/68 | HR 58 | Temp 97.3°F | Resp 18 | Ht 72.0 in | Wt 217.4 lb

## 2019-11-20 DIAGNOSIS — C9110 Chronic lymphocytic leukemia of B-cell type not having achieved remission: Secondary | ICD-10-CM | POA: Insufficient documentation

## 2019-11-20 DIAGNOSIS — D72829 Elevated white blood cell count, unspecified: Secondary | ICD-10-CM

## 2019-11-20 DIAGNOSIS — D6959 Other secondary thrombocytopenia: Secondary | ICD-10-CM | POA: Diagnosis not present

## 2019-11-20 LAB — CMP (CANCER CENTER ONLY)
ALT: 10 U/L (ref 0–44)
AST: 9 U/L — ABNORMAL LOW (ref 15–41)
Albumin: 3.9 g/dL (ref 3.5–5.0)
Alkaline Phosphatase: 89 U/L (ref 38–126)
Anion gap: 10 (ref 5–15)
BUN: 17 mg/dL (ref 8–23)
CO2: 26 mmol/L (ref 22–32)
Calcium: 9.8 mg/dL (ref 8.9–10.3)
Chloride: 101 mmol/L (ref 98–111)
Creatinine: 0.97 mg/dL (ref 0.61–1.24)
GFR, Est AFR Am: >60 mL/min (ref >60)
GFR, Estimated: >60 mL/min (ref >60)
Glucose, Bld: 155 mg/dL — ABNORMAL HIGH (ref 70–99)
Potassium: 3.8 mmol/L (ref 3.5–5.1)
Sodium: 137 mmol/L (ref 135–145)
Total Bilirubin: 0.4 mg/dL (ref 0.3–1.2)
Total Protein: 6.9 g/dL (ref 6.5–8.1)

## 2019-11-20 LAB — CBC WITH DIFFERENTIAL (CANCER CENTER ONLY)
Abs Immature Granulocytes: 0.08 10*3/uL — ABNORMAL HIGH (ref 0.00–0.07)
Basophils Absolute: 0.1 10*3/uL (ref 0.0–0.1)
Basophils Relative: 0 %
Eosinophils Absolute: 0.1 10*3/uL (ref 0.0–0.5)
Eosinophils Relative: 0 %
HCT: 39.2 % (ref 39.0–52.0)
Hemoglobin: 13 g/dL (ref 13.0–17.0)
Immature Granulocytes: 0 %
Lymphocytes Relative: 81 %
Lymphs Abs: 36.9 10*3/uL — ABNORMAL HIGH (ref 0.7–4.0)
MCH: 33 pg (ref 26.0–34.0)
MCHC: 33.2 g/dL (ref 30.0–36.0)
MCV: 99.5 fL (ref 80.0–100.0)
Monocytes Absolute: 5.7 10*3/uL — ABNORMAL HIGH (ref 0.1–1.0)
Monocytes Relative: 12 %
Neutro Abs: 3.3 10*3/uL (ref 1.7–7.7)
Neutrophils Relative %: 7 %
Platelet Count: 147 10*3/uL — ABNORMAL LOW (ref 150–400)
RBC: 3.94 MIL/uL — ABNORMAL LOW (ref 4.22–5.81)
RDW: 17.6 % — ABNORMAL HIGH (ref 11.5–15.5)
WBC Count: 46.2 10*3/uL — ABNORMAL HIGH (ref 4.0–10.5)
nRBC: 0 % (ref 0.0–0.2)

## 2019-11-20 MED ORDER — IBRUTINIB 420 MG PO TABS: 420.0000 mg | ORAL_TABLET | Freq: Every day | ORAL | 3 refills | Status: DC

## 2019-11-20 NOTE — Telephone Encounter (Signed)
Scheduled per 08/11 los, patient has been called and notified. 

## 2019-11-20 NOTE — Progress Notes (Signed)
Hematology and Oncology Follow Up Visit  Douglas [name] [id number] 1950/10/24 69 y.o. 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  He presented with stage I disease, lymphadenopathy and lymphocytosis with ATM deletion.    Current therapy: Active surveillance.  Interim History: Douglas [name] is here for a follow-up evaluation visit.  Since last visit, he reports a few complaints since the last visit.  He has reported increase in his cervical adenopathy as well has increased fatigue and occasional dyspnea.  He denies any chest pain, palpitation or shortness of breath at rest.  He denies any recent hospitalization or illnesses.  He denies any fevers, chills or weight loss.  He denies any decline in his performance status.          Medications: Unchanged on review. Current Outpatient Medications  Medication Sig Dispense Refill  . albuterol (PROVENTIL HFA;VENTOLIN HFA) 108 (90 BASE) MCG/ACT inhaler Inhale 2 puffs into the lungs 4 (four) times daily. (Patient taking differently: Inhale 2 puffs into the lungs every 6 (six) hours as needed. ) 1 Inhaler 0  . amLODipine (NORVASC) 5 MG tablet Take 5 mg by mouth daily.    [name] azelastine (ASTELIN) 0.1 % nasal spray Place 1 spray into both nostrils as needed for rhinitis. Use in each nostril as directed    . loratadine (CLARITIN) 10 MG tablet Take 10 mg by mouth daily as needed.     . montelukast (SINGULAIR) 10 MG tablet Take 10 mg by mouth as needed.    . terazosin (HYTRIN) 10 MG capsule Take 10 mg by mouth at bedtime.    . valsartan-hydrochlorothiazide (DIOVAN-HCT) 320-25 MG tablet Take 1 tablet by mouth daily.     No current facility-administered medications for this visit.     Allergies: No Known Allergies      Physical Exam:   Blood pressure 129/68, pulse (!) 58, temperature (!) 97.3 F (36.3 C), temperature source Axillary, resp. rate 18, height 6'  (1.829 m), weight 217 lb 6.4 oz (98.6 kg), SpO2 100 %.    ECOG: 0   General appearance: Comfortable appearing without any discomfort Head: Normocephalic without any trauma Oropharynx: Mucous membranes are moist and pink without any thrush or ulcers. Eyes: Pupils are equal and round reactive to light. Lymph nodes: No cervical, supraclavicular, inguinal or axillary lymphadenopathy.   Heart:regular rate and rhythm.  S1 and S2 without leg edema. Lung: Clear without any rhonchi or wheezes.  No dullness to percussion. Abdomin: Soft, nontender, nondistended with good bowel sounds.  No hepatosplenomegaly. Musculoskeletal: No joint deformity or effusion.  Full range of motion noted. Neurological: No deficits noted on motor, sensory and deep tendon reflex exam. Skin: No petechial rash or dryness.  Appeared moist.        Lab Results: Lab Results  Component Value Date   WBC 31.0 (H) 04/09/2019   HGB 13.8 04/09/2019   HCT 42.1 04/09/2019   MCV 93.6 04/09/2019   PLT 102 (L) 04/09/2019     Chemistry      Component Value Date/Time   NA 138 04/09/2019 0742   K 4.3 04/09/2019 0742   CL 100 04/09/2019 0742   CO2 26 04/09/2019 0742   BUN 17 04/09/2019 0742   CREATININE 0.93 04/09/2019 0742      Component Value Date/Time   CALCIUM 9.1 04/09/2019 0742   ALKPHOS 75 04/09/2019 0742   AST 10 (L) 04/09/2019 0742   ALT 17  04/09/2019 0742   BILITOT 0.4 04/09/2019 0742        Impression and Plan:   69 year old with:  1.  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   The natural course of this disease was reviewed today and laboratory data were updated.  Treatment options were discussed which include oral targeted therapy with ibrutinib, venetoclax or systemic chemotherapy.  Indication for treatment did include rapid rise in his white cell count, adenopathy or cytopenias were reiterated.  Complications related to treatment were also  reviewed versus continued active surveillance and risk of developing disease progression.  After discussion today he is agreeable to proceed with ibrutinib.  Denture complication associated with this medication include nausea, fatigue, bruising, bleeding and cardiac arrhythmia.  Ideally staging CT scan would be needed but he has declined this option at this point.   2.  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  Platelet count improved at this time.   3.  Follow-up: In the next 4 to 6 weeks to follow his progress.   30  minutes were dedicated to this visit. The time was spent on reviewing laboratory data, discussing treatment options,  and answering questions regarding future plan.    [name], MD 8/11/20219:43 AM

## 2019-11-20 NOTE — Telephone Encounter (Signed)
Oral Oncology Patient Advocate Encounter  Prior Authorization for Douglas [name] has been approved.    PA# [id number] Effective dates: 11/20/19 through 11/19/20  Patient must use [facility].  Will follow up with them for copay and shipment information.  Oral Oncology Clinic will continue to follow.   [place] Patient [place] Phone [phone] Fax [phone] 11/20/2019 11:46 AM

## 2019-11-20 NOTE — Telephone Encounter (Signed)
Oral Oncology Patient Advocate Encounter  Received notification from [facility] that prior authorization for Imbruvica is required.  PA submitted on CoverMyMeds Key [id number]  Status is pending  Oral Oncology Clinic will continue to follow.  [place] Patient Douglas [name] Phone [phone] Fax [phone] 11/20/2019 11:31 AM

## 2019-11-20 NOTE — Telephone Encounter (Addendum)
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  Prescription dose and frequency assessed for appropriateness. OK for therapy initiation.   CMP and CBC w/ Diff from 11/20/2019 assessed, patient with leukocytosis (WBC 46.2 K/uL) and lymphocytosis (absolute lymphocyte 36.9 K/uL). Will expect transient increase in absolute lymphocyte count after initiating therapy secondary to Imbruvica. CMP stable. No dose adjustments required at this time.   Current medication list in Epic reviewed, no significant/relevant DDIs with Imbruvica identified:  Evaluated chart and no patient barriers to medication adherence noted.   Prescription is required to be filled through [facility] per patient's insurance. Redirected prescription to their dispensing pharmacy.   Oral Oncology Clinic will continue to follow for insurance authorization, copayment issues, initial counseling and start date.  Leron Croak, PharmD, BCPS Hematology/Oncology Clinical Pharmacist [facility] [phone] 11/20/2019 10:56 AM

## 2019-11-21 NOTE — Telephone Encounter (Signed)
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   Counseled patient on administration, dosing, side effects, monitoring, drug-food interactions, safe handling, storage, and disposal.  Patient will take Imbruvica 453m, 1 tablet (4227m by mouth once daily.  Patient will take Imbruvica at approximately the same time each day with a full glass of water and maintain adequate hydration throughout the day.  Patient knows to avoid grapefruit or grapefruit juice while on therapy with Imbruvica.  Imbruvica start date: 11/26/19  Adverse effects include but are not limited to: bruising, decreased blood counts, N/V, diarrhea, musculoskeletal pain, arthralgias, peripheral edema, and hemorrhage.   Patient will obtain anti diarrheal and alert the office of 4 or more loose stools above baseline.  Reviewed with patient importance of keeping a medication schedule and plan for any missed doses. No barriers to medication adherence identified.  Medication reconciliation performed and medication/allergy list updated.  ImKate Sables required by patient's insurance to be filled through [facility]. Patient made aware. Patient has already been in contact with [facility] and prescription will be delivered to patient on 11/26/19  All questions answered.  Mr. SaBorundaoiced understanding and appreciation.   Patient knows to call the office with questions or concerns.  ReLeron CroakPharmD, BCPS Hematology/Oncology Clinical Pharmacist [phone]/03/2020 1:47 PM

## 2019-12-03 ENCOUNTER — Other Ambulatory Visit: Payer: 59

## 2019-12-03 ENCOUNTER — Ambulatory Visit: Payer: 59 | Admitting: Oncology

## 2019-12-05 ENCOUNTER — Telehealth: Payer: Self-pay

## 2019-12-05 NOTE — Telephone Encounter (Signed)
Returned phone call back to patient about medication reaction. Per Dr Alen Blew, patient should stop taking meds and Call back to the office on Monday to report symptoms. Patient verbalized understanding.

## 2020-01-07 ENCOUNTER — Other Ambulatory Visit: Payer: Self-pay | Admitting: Oncology

## 2020-01-07 DIAGNOSIS — C9110 Chronic lymphocytic leukemia of B-cell type not having achieved remission: Secondary | ICD-10-CM

## 2020-01-08 ENCOUNTER — Inpatient Hospital Stay: Payer: 59 | Attending: Oncology

## 2020-01-08 ENCOUNTER — Other Ambulatory Visit: Payer: Self-pay

## 2020-01-08 ENCOUNTER — Inpatient Hospital Stay (HOSPITAL_BASED_OUTPATIENT_CLINIC_OR_DEPARTMENT_OTHER): Payer: 59 | Admitting: Oncology

## 2020-01-08 VITALS — BP 135/67 | HR 82 | Temp 98.2°F | Resp 18 | Ht 72.0 in | Wt 218.0 lb

## 2020-01-08 DIAGNOSIS — C9110 Chronic lymphocytic leukemia of B-cell type not having achieved remission: Secondary | ICD-10-CM | POA: Insufficient documentation

## 2020-01-08 DIAGNOSIS — D696 Thrombocytopenia, unspecified: Secondary | ICD-10-CM | POA: Insufficient documentation

## 2020-01-08 DIAGNOSIS — Z79899 Other long term (current) drug therapy: Secondary | ICD-10-CM | POA: Insufficient documentation

## 2020-01-08 LAB — CBC WITH DIFFERENTIAL (CANCER CENTER ONLY)
Abs Immature Granulocytes: 0.04 10*3/uL (ref 0.00–0.07)
Basophils Absolute: 0.1 10*3/uL (ref 0.0–0.1)
Basophils Relative: 0 %
Eosinophils Absolute: 0.1 10*3/uL (ref 0.0–0.5)
Eosinophils Relative: 1 %
HCT: 40.7 % (ref 39.0–52.0)
Hemoglobin: 13.3 g/dL (ref 13.0–17.0)
Immature Granulocytes: 0 %
Lymphocytes Relative: 77 %
Lymphs Abs: 14.5 10*3/uL — ABNORMAL HIGH (ref 0.7–4.0)
MCH: 31.6 pg (ref 26.0–34.0)
MCHC: 32.7 g/dL (ref 30.0–36.0)
MCV: 96.7 fL (ref 80.0–100.0)
Monocytes Absolute: 0.1 10*3/uL (ref 0.1–1.0)
Monocytes Relative: 1 %
Neutro Abs: 3.8 10*3/uL (ref 1.7–7.7)
Neutrophils Relative %: 21 %
Platelet Count: 160 10*3/uL (ref 150–400)
RBC: 4.21 MIL/uL — ABNORMAL LOW (ref 4.22–5.81)
RDW: 16.2 % — ABNORMAL HIGH (ref 11.5–15.5)
WBC Count: 18.7 10*3/uL — ABNORMAL HIGH (ref 4.0–10.5)
nRBC: 0 % (ref 0.0–0.2)

## 2020-01-08 LAB — CMP (CANCER CENTER ONLY)
ALT: 15 U/L (ref 0–44)
AST: 12 U/L — ABNORMAL LOW (ref 15–41)
Albumin: 3.9 g/dL (ref 3.5–5.0)
Alkaline Phosphatase: 67 U/L (ref 38–126)
Anion gap: 7 (ref 5–15)
BUN: 16 mg/dL (ref 8–23)
CO2: 32 mmol/L (ref 22–32)
Calcium: 9.5 mg/dL (ref 8.9–10.3)
Chloride: 99 mmol/L (ref 98–111)
Creatinine: 0.87 mg/dL (ref 0.61–1.24)
GFR, Est AFR Am: >60 mL/min (ref >60)
GFR, Estimated: >60 mL/min (ref >60)
Glucose, Bld: 111 mg/dL — ABNORMAL HIGH (ref 70–99)
Potassium: 4 mmol/L (ref 3.5–5.1)
Sodium: 138 mmol/L (ref 135–145)
Total Bilirubin: 0.5 mg/dL (ref 0.3–1.2)
Total Protein: 6.8 g/dL (ref 6.5–8.1)

## 2020-01-08 NOTE — Progress Notes (Signed)
Hematology and Oncology Follow Up Visit  Douglas [name] [id number] Jan 12, 1951 69 y.o. 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   Current therapy: Ibrutinib 420 mg started in August 2021.  Interim History: [name] is here for repeat follow-up.  Since the last visit, he started taking ibrutinib with significant improvement lymphadenopathy few days after starting treatment.  He noticed resolution of his lymph nodes 3 days after.  He denies any major complications related to this medication.  He did have a lacrimal duct irritation which resolved after few days.  He denies any nausea, vomiting or abdominal pain.  He does report some mild fatigue but no palpitation or orthopnea.  His performance status and quality of life remain excellent.          Medications: Reviewed without changes. Current Outpatient Medications  Medication Sig Dispense Refill   albuterol (PROVENTIL HFA;VENTOLIN HFA) 108 (90 BASE) MCG/ACT inhaler Inhale 2 puffs into the lungs 4 (four) times daily. (Patient taking differently: Inhale 2 puffs into the lungs every 6 (six) hours as needed. ) 1 Inhaler 0   albuterol (PROVENTIL) (2.5 MG/3ML) 0.083% nebulizer solution Take by nebulization.     amLODipine (NORVASC) 5 MG tablet Take 5 mg by mouth daily.     azelastine (ASTELIN) 0.1 % nasal spray Place 1 spray into both nostrils as needed for rhinitis. Use in each nostril as directed     Ibrutinib 420 MG TABS Take 420 mg by mouth daily. 30 tablet 3   loratadine (CLARITIN) 10 MG tablet Take 10 mg by mouth daily as needed.      montelukast (SINGULAIR) 10 MG tablet Take 10 mg by mouth as needed.     terazosin (HYTRIN) 10 MG capsule Take 10 mg by mouth at bedtime.     valsartan-hydrochlorothiazide  (DIOVAN-HCT) 320-25 MG tablet Take 1 tablet by mouth daily.     No current facility-administered medications for this visit.     Allergies: No Known Allergies      Physical Exam:   Blood pressure 135/67, pulse 82, temperature 98.2 F (36.8 C), temperature source Tympanic, resp. rate 18, height 6' (1.829 m), weight 218 lb (98.9 kg), SpO2 100 %.    ECOG: 0    General appearance: Alert, awake without any distress. Head: Atraumatic without abnormalities Oropharynx: Without any thrush or ulcers. Eyes: No scleral icterus. Lymph nodes: Cervical adenopathy have resolved with very few palpated nodes noted bilaterally. Heart:regular rate and rhythm, without any murmurs or gallops.   Lung: Clear to auscultation without any rhonchi, wheezes or dullness to percussion. Abdomin: Soft, nontender without any shifting dullness or ascites. Musculoskeletal: No clubbing or cyanosis. Neurological: No motor or sensory deficits. Skin: No rashes or lesions.       Lab Results: Lab Results  Component Value Date   WBC 46.2 (H) 11/20/2019   HGB 13.0 11/20/2019   HCT 39.2 11/20/2019   MCV 99.5 11/20/2019   PLT 147 (L) 11/20/2019     Chemistry      Component Value Date/Time   NA 137 11/20/2019 0935   K 3.8 11/20/2019 0935   CL 101 11/20/2019 0935   CO2 26 11/20/2019 0935   BUN 17 11/20/2019 0935   CREATININE 0.97 11/20/2019 0935      Component Value Date/Time  CALCIUM 9.8 11/20/2019 0935   ALKPHOS 89 11/20/2019 0935   AST 9 (L) 11/20/2019 0935   ALT 10 11/20/2019 0935   BILITOT 0.4 11/20/2019 0935        Impression and Plan:   69 year old with:  1.  CLL diagnosed in August 2020.  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  He was started on ibrutinib and August 2021 without any major complication.  He has experienced excellent clinical benefit at this time with improvement in his lymphadenopathy.  Laboratory data reviewed  today and showed significant improvement in his white cell count with decline of total white cell count of 18.7.  Risks and benefits of continuing this treatment were discussed today.  Complication associated with this treatment were reiterated including cardiac arrhythmias, shortness of breath, bleeding among others.  After discussion today he is agreeable to continue given his excellent response.   2.  Thrombocytopenia: Improved with the treatment of ibrutinib.   3.  Follow-up: In the next 6 to 8 weeks to follow his progress.   30  minutes were spent on this encounter.  The time was dedicated to reviewing his disease status, reviewing laboratory data discussed complication related to his therapy and future plan of care review.    Douglas Button, MD 9/29/20218:15 AM

## 2020-01-16 ENCOUNTER — Telehealth: Payer: Self-pay

## 2020-01-16 ENCOUNTER — Telehealth: Payer: Self-pay | Admitting: Pharmacist

## 2020-01-16 ENCOUNTER — Telehealth: Payer: Self-pay | Admitting: Oncology

## 2020-01-16 NOTE — Telephone Encounter (Signed)
Per 10/7 sch. Message moved appointments to [phone] and [phone] spoke with patient he is aware

## 2020-01-16 NOTE — Telephone Encounter (Signed)
Oral Chemotherapy Pharmacist Encounter   Spoke with patient today to follow up regarding patient's oral chemotherapy medication: Imbruvica (ibrutinib)  Patient stated that his copay card funding for Imbruvica would be used up prior to November. The current copay card has a maximum limit of $24,600 per calendar year.   I called and spoke with a representative for the copay card who was able to provide information regarding paid claims on copay card and that the patient should have enough funds on the card for coverage through the end December, at which time the patient should be able reapply for the copay card.   [facility], patient's dispensing pharmacy, was provided with updated information regarding the copay card as well as phone number provided by Imbruvica representative that [facility] may reach out to regarding updates needed to billed claims when processing Mr. Frieze's next prescription.     Patient updated with this information and expressed understanding and appreciation. He was provided with the [facility] phone number if he has further questions.  Leron Croak, PharmD, BCPS Hematology/Oncology Clinical Pharmacist [facility] [phone] 01/16/2020 11:31 AM

## 2020-01-16 NOTE — Telephone Encounter (Signed)
Patient called and stated that he is running out of financial assist prior to end of the year and wanted to know if any other resources are available.Explained that will leave a message for [name] with pharmacy to reach out regarding resources for Imbruvica. Patient also stated that he is not able to attend the 3:00 pm appt on 11/18 and he can only come in the mornings. Explained that will send a scheduling message requesting an appt change. Patient verbalized understanding and had no other questions or concerns.

## 2020-02-27 ENCOUNTER — Other Ambulatory Visit: Payer: Self-pay

## 2020-02-27 ENCOUNTER — Inpatient Hospital Stay (HOSPITAL_BASED_OUTPATIENT_CLINIC_OR_DEPARTMENT_OTHER): Payer: 59 | Admitting: Oncology

## 2020-02-27 ENCOUNTER — Inpatient Hospital Stay: Payer: 59 | Attending: Oncology

## 2020-02-27 ENCOUNTER — Other Ambulatory Visit: Payer: 59

## 2020-02-27 ENCOUNTER — Ambulatory Visit: Payer: 59 | Admitting: Oncology

## 2020-02-27 VITALS — BP 138/74 | HR 78 | Temp 97.8°F | Resp 18 | Ht 72.0 in | Wt 215.4 lb

## 2020-02-27 DIAGNOSIS — C9110 Chronic lymphocytic leukemia of B-cell type not having achieved remission: Secondary | ICD-10-CM

## 2020-02-27 DIAGNOSIS — L02411 Cutaneous abscess of right axilla: Secondary | ICD-10-CM | POA: Diagnosis not present

## 2020-02-27 DIAGNOSIS — L02219 Cutaneous abscess of trunk, unspecified: Secondary | ICD-10-CM

## 2020-02-27 DIAGNOSIS — L03319 Cellulitis of trunk, unspecified: Secondary | ICD-10-CM

## 2020-02-27 LAB — CBC WITH DIFFERENTIAL (CANCER CENTER ONLY)
Abs Immature Granulocytes: 0.15 10*3/uL — ABNORMAL HIGH (ref 0.00–0.07)
Basophils Absolute: 0.1 10*3/uL (ref 0.0–0.1)
Basophils Relative: 0 %
Eosinophils Absolute: 0.1 10*3/uL (ref 0.0–0.5)
Eosinophils Relative: 1 %
HCT: 45.6 % (ref 39.0–52.0)
Hemoglobin: 14.8 g/dL (ref 13.0–17.0)
Immature Granulocytes: 1 %
Lymphocytes Relative: 66 %
Lymphs Abs: 10.6 10*3/uL — ABNORMAL HIGH (ref 0.7–4.0)
MCH: 29.5 pg (ref 26.0–34.0)
MCHC: 32.5 g/dL (ref 30.0–36.0)
MCV: 91 fL (ref 80.0–100.0)
Monocytes Absolute: 0.2 10*3/uL (ref 0.1–1.0)
Monocytes Relative: 1 %
Neutro Abs: 4.9 10*3/uL (ref 1.7–7.7)
Neutrophils Relative %: 31 %
Platelet Count: 227 10*3/uL (ref 150–400)
RBC: 5.01 MIL/uL (ref 4.22–5.81)
RDW: 16.5 % — ABNORMAL HIGH (ref 11.5–15.5)
WBC Count: 16.1 10*3/uL — ABNORMAL HIGH (ref 4.0–10.5)
nRBC: 0 % (ref 0.0–0.2)

## 2020-02-27 LAB — CMP (CANCER CENTER ONLY)
ALT: 15 U/L (ref 0–44)
AST: 12 U/L — ABNORMAL LOW (ref 15–41)
Albumin: 3.8 g/dL (ref 3.5–5.0)
Alkaline Phosphatase: 82 U/L (ref 38–126)
Anion gap: 8 (ref 5–15)
BUN: 17 mg/dL (ref 8–23)
CO2: 28 mmol/L (ref 22–32)
Calcium: 9.4 mg/dL (ref 8.9–10.3)
Chloride: 101 mmol/L (ref 98–111)
Creatinine: 0.91 mg/dL (ref 0.61–1.24)
GFR, Estimated: >60 mL/min (ref >60)
Glucose, Bld: 120 mg/dL — ABNORMAL HIGH (ref 70–99)
Potassium: 4.1 mmol/L (ref 3.5–5.1)
Sodium: 137 mmol/L (ref 135–145)
Total Bilirubin: 0.5 mg/dL (ref 0.3–1.2)
Total Protein: 6.9 g/dL (ref 6.5–8.1)

## 2020-02-27 NOTE — Progress Notes (Signed)
Hematology and Oncology Follow Up Visit  Douglas [name] [id number] May 16, 1950 69 y.o. 02/27/2020 8:02 AM [name], MDMorrow, [name], MD   Principle Diagnosis: 69 year old man with CLL diagnosed in August 2020.  He presented with stage I disease, cervical lymphadenopathy with ATM deletion.  Current therapy: Ibrutinib 420 mg started in August 2021.  Interim History: [name] returns today for repeat evaluation.  Since her last visit, he reports no major changes in his health.  He continues to tolerate ibrutinib without any complaints.  He denies any nausea, vomiting or abdominal pain.  He denies any bruising or bleeding complications.  He denies any palpitation.  He did report drainage from a right axillary cyst which has been noted previously.  He denies any increased pain or range of motion.          Medications: Unchanged on review. Current Outpatient Medications  Medication Sig Dispense Refill  . albuterol (PROVENTIL HFA;VENTOLIN HFA) 108 (90 BASE) MCG/ACT inhaler Inhale 2 puffs into the lungs 4 (four) times daily. (Patient taking differently: Inhale 2 puffs into the lungs every 6 (six) hours as needed. ) 1 Inhaler 0  . albuterol (PROVENTIL) (2.5 MG/3ML) 0.083% nebulizer solution Take by nebulization.    [name] amLODipine (NORVASC) 5 MG tablet Take 5 mg by mouth daily.    [name] azelastine (ASTELIN) 0.1 % nasal spray Place 1 spray into both nostrils as needed for rhinitis. Use in each nostril as directed    . Ibrutinib 420 MG TABS Take 420 mg by mouth daily. 30 tablet 3  . loratadine (CLARITIN) 10 MG tablet Take 10 mg by mouth daily as needed.     . montelukast (SINGULAIR) 10 MG tablet Take 10 mg by mouth as needed.    . terazosin (HYTRIN) 10 MG capsule Take 10 mg by mouth at bedtime.    . valsartan-hydrochlorothiazide (DIOVAN-HCT) 320-25 MG tablet Take 1 tablet by mouth daily.     No current facility-administered medications for this visit.     Allergies: No Known  Allergies      Physical Exam:   Blood pressure 138/74, pulse 78, temperature 97.8 F (36.6 C), temperature source Tympanic, resp. rate 18, height 6' (1.829 m), weight 215 lb 6.4 oz (97.7 kg), SpO2 99 %.    ECOG: 0    General appearance: Comfortable appearing without any discomfort Head: Normocephalic without any trauma Oropharynx: Mucous membranes are moist and pink without any thrush or ulcers. Eyes: Pupils are equal and round reactive to light. Lymph nodes: No cervical, supraclavicular, inguinal or axillary lymphadenopathy.   Right axillary nodular protrusion noted with drainage and mild erythema. Heart:regular rate and rhythm.  S1 and S2 without leg edema. Lung: Clear without any rhonchi or wheezes.  No dullness to percussion. Abdomin: Soft, nontender, nondistended with good bowel sounds.  No hepatosplenomegaly. Musculoskeletal: No joint deformity or effusion.  Full range of motion noted. Neurological: No deficits noted on motor, sensory and deep tendon reflex exam. Skin: No petechial rash or dryness.  Appeared moist.  .       Lab Results: Lab Results  Component Value Date   WBC 16.1 (H) 02/27/2020   HGB 14.8 02/27/2020   HCT 45.6 02/27/2020   MCV 91.0 02/27/2020   PLT 227 02/27/2020     Chemistry      Component Value Date/Time   NA 138 01/08/2020 0810   K 4.0 01/08/2020 0810   CL 99 01/08/2020 0810   CO2 32 01/08/2020 0810   BUN 16 01/08/2020  0810   CREATININE 0.87 01/08/2020 0810      Component Value Date/Time   CALCIUM 9.5 01/08/2020 0810   ALKPHOS 67 01/08/2020 0810   AST 12 (L) 01/08/2020 0810   ALT 15 01/08/2020 0810   BILITOT 0.5 01/08/2020 0810        Impression and Plan:   69 year old with:  1.  Stage I CLL presented with lymphadenopathy, ATM deletion as well as trisomy 38 in August 2020.  He is currently on ibrutinib with excellent clinical benefit as well as hematological response.  Risks and benefits of continuing this treatment  were reviewed today.  Cardiac complications including arrhythmia and bleeding were reiterated.  Alternative options such as systemic chemotherapy among other options were reviewed.  CBC from today showed a continuous decline in his lymphocytosis with normalization of his hemoglobin and platelet count.   2.  Thrombocytopenia: Related to CLL and resolved at this time.  3.  Right axillary cyst: Appears to be draining and possibly forming an abscess.  I favor drainage at this time and surgical evaluation.  I will make an appropriate referral to general surgery.   4.  Follow-up: In 2 months for a follow-up.   30  minutes were dedicated to this visit.  The time was spent on reviewing laboratory data, reviewing clinical status, discussing treatment options and future plan of care review.    [name], MD 11/18/20218:02 AM

## 2020-03-01 ENCOUNTER — Other Ambulatory Visit: Payer: Self-pay | Admitting: Oncology

## 2020-03-01 DIAGNOSIS — C9110 Chronic lymphocytic leukemia of B-cell type not having achieved remission: Secondary | ICD-10-CM

## 2020-03-09 ENCOUNTER — Other Ambulatory Visit: Payer: Self-pay | Admitting: Oncology

## 2020-03-09 ENCOUNTER — Telehealth: Payer: Self-pay | Admitting: *Deleted

## 2020-03-09 DIAGNOSIS — C9110 Chronic lymphocytic leukemia of B-cell type not having achieved remission: Secondary | ICD-10-CM

## 2020-03-09 NOTE — Telephone Encounter (Signed)
Pt called asking about next appt with Dr Alen Blew.  Informed of appts & he states they are too late in day & he needs earlier appts.  Message to Schedulers to adjust appt & call pt.

## 2020-05-02 ENCOUNTER — Other Ambulatory Visit: Payer: Self-pay | Admitting: Oncology

## 2020-05-02 DIAGNOSIS — C9110 Chronic lymphocytic leukemia of B-cell type not having achieved remission: Secondary | ICD-10-CM

## 2020-05-07 ENCOUNTER — Ambulatory Visit: Payer: 59 | Admitting: Oncology

## 2020-05-07 ENCOUNTER — Other Ambulatory Visit: Payer: 59

## 2020-05-07 ENCOUNTER — Other Ambulatory Visit: Payer: Self-pay

## 2020-05-07 ENCOUNTER — Inpatient Hospital Stay: Payer: 59 | Attending: Oncology

## 2020-05-07 ENCOUNTER — Inpatient Hospital Stay (HOSPITAL_BASED_OUTPATIENT_CLINIC_OR_DEPARTMENT_OTHER): Payer: 59 | Admitting: Oncology

## 2020-05-07 VITALS — BP 159/71 | HR 80 | Temp 98.1°F | Resp 20 | Ht 72.0 in | Wt 223.2 lb

## 2020-05-07 DIAGNOSIS — I10 Essential (primary) hypertension: Secondary | ICD-10-CM | POA: Insufficient documentation

## 2020-05-07 DIAGNOSIS — C9110 Chronic lymphocytic leukemia of B-cell type not having achieved remission: Secondary | ICD-10-CM | POA: Diagnosis not present

## 2020-05-07 LAB — CBC WITH DIFFERENTIAL (CANCER CENTER ONLY)
Abs Immature Granulocytes: 0.04 10*3/uL (ref 0.00–0.07)
Basophils Absolute: 0.1 10*3/uL (ref 0.0–0.1)
Basophils Relative: 1 %
Eosinophils Absolute: 0.1 10*3/uL (ref 0.0–0.5)
Eosinophils Relative: 1 %
HCT: 47 % (ref 39.0–52.0)
Hemoglobin: 15 g/dL (ref 13.0–17.0)
Immature Granulocytes: 0 %
Lymphocytes Relative: 49 %
Lymphs Abs: 5.7 10*3/uL — ABNORMAL HIGH (ref 0.7–4.0)
MCH: 28.2 pg (ref 26.0–34.0)
MCHC: 31.9 g/dL (ref 30.0–36.0)
MCV: 88.3 fL (ref 80.0–100.0)
Monocytes Absolute: 0.3 10*3/uL (ref 0.1–1.0)
Monocytes Relative: 3 %
Neutro Abs: 5.3 10*3/uL (ref 1.7–7.7)
Neutrophils Relative %: 46 %
Platelet Count: 292 10*3/uL (ref 150–400)
RBC: 5.32 MIL/uL (ref 4.22–5.81)
RDW: 18.5 % — ABNORMAL HIGH (ref 11.5–15.5)
WBC Count: 11.5 10*3/uL — ABNORMAL HIGH (ref 4.0–10.5)
nRBC: 0 % (ref 0.0–0.2)

## 2020-05-07 LAB — CMP (CANCER CENTER ONLY)
ALT: 20 U/L (ref 0–44)
AST: 12 U/L — ABNORMAL LOW (ref 15–41)
Albumin: 3.8 g/dL (ref 3.5–5.0)
Alkaline Phosphatase: 81 U/L (ref 38–126)
Anion gap: 10 (ref 5–15)
BUN: 18 mg/dL (ref 8–23)
CO2: 28 mmol/L (ref 22–32)
Calcium: 9.4 mg/dL (ref 8.9–10.3)
Chloride: 101 mmol/L (ref 98–111)
Creatinine: 0.94 mg/dL (ref 0.61–1.24)
GFR, Estimated: >60 mL/min (ref >60)
Glucose, Bld: 158 mg/dL — ABNORMAL HIGH (ref 70–99)
Potassium: 4.3 mmol/L (ref 3.5–5.1)
Sodium: 139 mmol/L (ref 135–145)
Total Bilirubin: 0.6 mg/dL (ref 0.3–1.2)
Total Protein: 6.6 g/dL (ref 6.5–8.1)

## 2020-05-07 NOTE — Progress Notes (Signed)
Hematology and Oncology Follow Up Visit  Douglas [name] [id number] [birth year] 70 y.o. 05/07/2020 8:20 AM [name], MDMorrow, Douglas Lies, MD   Principle Diagnosis: 70 year old man with stage I CLL presented with cervical adenopathy and ATM deletion in August 2020.    Current therapy: Ibrutinib 420 mg started in August 2021.  Interim History: [name] is here for a follow-up visit.  Since last visit, he reports feeling reasonably well without any major complaints.  In the last 10 days he has noted some occasional dizziness and lightheadedness associated with increased blood pressure.  He denies any palpitation or orthopnea or shortness of breath.  He denies any hospitalization or illnesses.  He denies any bleeding or bruising issues.  He has tolerated ibrutinib otherwise reasonably well.          Medications: Updated on review. Current Outpatient Medications  Medication Sig Dispense Refill  . albuterol (PROVENTIL HFA;VENTOLIN HFA) 108 (90 BASE) MCG/ACT inhaler Inhale 2 puffs into the lungs 4 (four) times daily. (Patient taking differently: Inhale 2 puffs into the lungs every 6 (six) hours as needed. ) 1 Inhaler 0  . albuterol (PROVENTIL) (2.5 MG/3ML) 0.083% nebulizer solution Take by nebulization.    [name] amLODipine (NORVASC) 5 MG tablet Take 5 mg by mouth daily.    [name] azelastine (ASTELIN) 0.1 % nasal spray Place 1 spray into both nostrils as needed for rhinitis. Use in each nostril as directed    . IMBRUVICA 420 MG TABS TAKE 1 TABLET BY MOUTH ONCE DAILY WITH A FULL GLASS OF  WATER 28 tablet 0  . loratadine (CLARITIN) 10 MG tablet Take 10 mg by mouth daily as needed.     . montelukast (SINGULAIR) 10 MG tablet Take 10 mg by mouth as needed.    . terazosin (HYTRIN) 10 MG capsule Take 10 mg by mouth at bedtime.    . valsartan-hydrochlorothiazide (DIOVAN-HCT) 320-25 MG tablet Take 1 tablet by mouth daily.     No current facility-administered medications for this visit.     Allergies:  No Known Allergies      Physical Exam:   Blood pressure (!) 159/71, pulse 80, temperature 98.1 F (36.7 C), temperature source Tympanic, resp. rate 20, height 6' (1.829 m), weight 223 lb 3.2 oz (101.2 kg), SpO2 100 %.    ECOG: 0     General appearance: Alert, awake without any distress. Head: Atraumatic without abnormalities Oropharynx: Without any thrush or ulcers. Eyes: No scleral icterus. Lymph nodes: No lymphadenopathy noted in the cervical, supraclavicular, or axillary nodes Heart:regular rate and rhythm, without any murmurs or gallops.   Lung: Clear to auscultation without any rhonchi, wheezes or dullness to percussion. Abdomin: Soft, nontender without any shifting dullness or ascites. Musculoskeletal: No clubbing or cyanosis. Neurological: No motor or sensory deficits. Skin: No rashes or lesions.        Lab Results: Lab Results  Component Value Date   WBC 11.5 (H) 05/07/2020   HGB 15.0 05/07/2020   HCT 47.0 05/07/2020   MCV 88.3 05/07/2020   PLT 292 05/07/2020     Chemistry      Component Value Date/Time   NA 137 02/27/2020 0748   K 4.1 02/27/2020 0748   CL 101 02/27/2020 0748   CO2 28 02/27/2020 0748   BUN 17 02/27/2020 0748   CREATININE 0.91 02/27/2020 0748      Component Value Date/Time   CALCIUM 9.4 02/27/2020 0748   ALKPHOS 82 02/27/2020 0748   AST 12 (L) 02/27/2020 9628  ALT 15 02/27/2020 0748   BILITOT 0.5 02/27/2020 0748        Impression and Plan:   70 year old with:  1.  CLL diagnosed in August 2020.  He presented with stage I disease, ATM deletion and cervical adenopathy.   The natural course of this disease updated and treatment options were reviewed.  He is currently on ibrutinib with excellent response to therapy.  His lymphocytosis has nearly normalized and his lymphadenopathy has regressed.  Complication associated with this treatment including cardiac arrhythmias and bleeding complications were reviewed.  At this time  he is agreeable to continue.  Alternative treatment options such as systemic chemotherapy and rituximab others were reviewed.   2.  Hypertension: His blood pressure has been elevated the last 2 weeks.  This could be attributed to ibrutinib although this is a recent phenomenon and he has been on this medication for 6 months.  He has follow-up with his primary care physician next week to address this issue.  I recommend adjusting blood pressure medication rather than discontinuation of ibrutinib.  3.  Right axillary cyst: Resolved at this time.   4.  Follow-up: In 2 months for a follow-up.   30  minutes were spent on this encounter.  Time was spent on reviewing laboratory data, disease status update treatment options for future.    [name], MD 1/27/20228:20 AM

## 2020-05-11 ENCOUNTER — Other Ambulatory Visit: Payer: 59

## 2020-05-11 ENCOUNTER — Ambulatory Visit: Payer: 59 | Admitting: Oncology

## 2020-05-28 ENCOUNTER — Other Ambulatory Visit: Payer: Self-pay | Admitting: Oncology

## 2020-05-28 DIAGNOSIS — C9110 Chronic lymphocytic leukemia of B-cell type not having achieved remission: Secondary | ICD-10-CM

## 2020-06-18 ENCOUNTER — Other Ambulatory Visit: Payer: Self-pay | Admitting: Oncology

## 2020-06-18 DIAGNOSIS — C9110 Chronic lymphocytic leukemia of B-cell type not having achieved remission: Secondary | ICD-10-CM

## 2020-07-09 ENCOUNTER — Ambulatory Visit: Payer: 59 | Admitting: Oncology

## 2020-07-09 ENCOUNTER — Inpatient Hospital Stay: Payer: 59 | Attending: Oncology

## 2020-07-09 ENCOUNTER — Inpatient Hospital Stay (HOSPITAL_BASED_OUTPATIENT_CLINIC_OR_DEPARTMENT_OTHER): Payer: 59 | Admitting: Oncology

## 2020-07-09 ENCOUNTER — Other Ambulatory Visit: Payer: Self-pay

## 2020-07-09 ENCOUNTER — Other Ambulatory Visit: Payer: 59

## 2020-07-09 VITALS — BP 138/87 | HR 85 | Temp 96.6°F | Resp 18 | Ht 72.0 in | Wt 222.1 lb

## 2020-07-09 DIAGNOSIS — I10 Essential (primary) hypertension: Secondary | ICD-10-CM | POA: Diagnosis not present

## 2020-07-09 DIAGNOSIS — C9110 Chronic lymphocytic leukemia of B-cell type not having achieved remission: Secondary | ICD-10-CM

## 2020-07-09 LAB — CMP (CANCER CENTER ONLY)
ALT: 44 U/L (ref 0–44)
AST: 23 U/L (ref 15–41)
Albumin: 3.5 g/dL (ref 3.5–5.0)
Alkaline Phosphatase: 97 U/L (ref 38–126)
Anion gap: 13 (ref 5–15)
BUN: 19 mg/dL (ref 8–23)
CO2: 25 mmol/L (ref 22–32)
Calcium: 8.6 mg/dL — ABNORMAL LOW (ref 8.9–10.3)
Chloride: 101 mmol/L (ref 98–111)
Creatinine: 0.89 mg/dL (ref 0.61–1.24)
GFR, Estimated: >60 mL/min (ref >60)
Glucose, Bld: 188 mg/dL — ABNORMAL HIGH (ref 70–99)
Potassium: 3.6 mmol/L (ref 3.5–5.1)
Sodium: 139 mmol/L (ref 135–145)
Total Bilirubin: 0.6 mg/dL (ref 0.3–1.2)
Total Protein: 6.2 g/dL — ABNORMAL LOW (ref 6.5–8.1)

## 2020-07-09 LAB — CBC WITH DIFFERENTIAL (CANCER CENTER ONLY)
Abs Immature Granulocytes: 0.05 10*3/uL (ref 0.00–0.07)
Basophils Absolute: 0.1 10*3/uL (ref 0.0–0.1)
Basophils Relative: 1 %
Eosinophils Absolute: 0 10*3/uL (ref 0.0–0.5)
Eosinophils Relative: 1 %
HCT: 49.9 % (ref 39.0–52.0)
Hemoglobin: 16.2 g/dL (ref 13.0–17.0)
Immature Granulocytes: 1 %
Lymphocytes Relative: 40 %
Lymphs Abs: 3.1 10*3/uL (ref 0.7–4.0)
MCH: 27.9 pg (ref 26.0–34.0)
MCHC: 32.5 g/dL (ref 30.0–36.0)
MCV: 85.9 fL (ref 80.0–100.0)
Monocytes Absolute: 0.4 10*3/uL (ref 0.1–1.0)
Monocytes Relative: 5 %
Neutro Abs: 4.1 10*3/uL (ref 1.7–7.7)
Neutrophils Relative %: 52 %
Platelet Count: 233 10*3/uL (ref 150–400)
RBC: 5.81 MIL/uL (ref 4.22–5.81)
RDW: 17.6 % — ABNORMAL HIGH (ref 11.5–15.5)
WBC Count: 7.7 10*3/uL (ref 4.0–10.5)
nRBC: 0 % (ref 0.0–0.2)

## 2020-07-09 NOTE — Progress Notes (Signed)
Hematology and Oncology Follow Up Visit  Douglas [name] [id number] May 28, 1950 70 y.o. 07/09/2020 8:02 AM [name], MDMorrow, [name], MD   Principle Diagnosis: 70 year old man with CLL presented in August 2020.  He was found to have stage I disease with cervical adenopathy and ATM deletion.  Current therapy: Ibrutinib 420 mg started in August 2021.  Interim History: [name] presents today for repeat evaluation.  Since the last visit, he reports feeling well without any major complaints.  He denies recent hospitalizations or illnesses.  He denies any complications related to ibrutinib.  Denies any nausea, vomiting or abdominal pain.  He denies any bruising or hemoptysis.  He denies any lower extremity edema.  His cervical adenopathy has not resolved.          Medications: Unchanged on review. Current Outpatient Medications  Medication Sig Dispense Refill  . albuterol (PROVENTIL HFA;VENTOLIN HFA) 108 (90 BASE) MCG/ACT inhaler Inhale 2 puffs into the lungs 4 (four) times daily. (Patient taking differently: Inhale 2 puffs into the lungs every 6 (six) hours as needed. ) 1 Inhaler 0  . albuterol (PROVENTIL) (2.5 MG/3ML) 0.083% nebulizer solution Take by nebulization.    [name] amLODipine (NORVASC) 5 MG tablet Take 5 mg by mouth daily.    [name] azelastine (ASTELIN) 0.1 % nasal spray Place 1 spray into both nostrils as needed for rhinitis. Use in each nostril as directed    . IMBRUVICA 420 MG TABS TAKE 1 TABLET BY MOUTH ONCE DAILY WITH A FULL GLASS OF  WATER 28 tablet 0  . loratadine (CLARITIN) 10 MG tablet Take 10 mg by mouth daily as needed.     . montelukast (SINGULAIR) 10 MG tablet Take 10 mg by mouth as needed.    . terazosin (HYTRIN) 10 MG capsule Take 10 mg by mouth at bedtime.    . valsartan-hydrochlorothiazide (DIOVAN-HCT) 320-25 MG tablet Take 1 tablet by mouth daily.     No current facility-administered medications for this visit.     Allergies: No Known  Allergies      Physical Exam:    Blood pressure 138/87, pulse 85, temperature (!) 96.6 F (35.9 C), temperature source Tympanic, resp. rate 18, height 6' (1.829 m), weight 222 lb 1.6 oz (100.7 kg), SpO2 98 %.    ECOG: 0    General appearance: Comfortable appearing without any discomfort Head: Normocephalic without any trauma Oropharynx: Mucous membranes are moist and pink without any thrush or ulcers. Eyes: Pupils are equal and round reactive to light. Lymph nodes: No cervical adenopathy palpated on today's exam. Heart:regular rate and rhythm.  S1 and S2 without leg edema. Lung: Clear without any rhonchi or wheezes.  No dullness to percussion. Abdomin: Soft, nontender, nondistended with good bowel sounds.  No hepatosplenomegaly. Musculoskeletal: No joint deformity or effusion.  Full range of motion noted. Neurological: No deficits noted on motor, sensory and deep tendon reflex exam. Skin: No petechial rash or dryness.  Appeared moist.          Lab Results: Lab Results  Component Value Date   WBC 11.5 (H) 05/07/2020   HGB 15.0 05/07/2020   HCT 47.0 05/07/2020   MCV 88.3 05/07/2020   PLT 292 05/07/2020     Chemistry      Component Value Date/Time   NA 139 05/07/2020 0739   K 4.3 05/07/2020 0739   CL 101 05/07/2020 0739   CO2 28 05/07/2020 0739   BUN 18 05/07/2020 0739   CREATININE 0.94 05/07/2020 0739  Component Value Date/Time   CALCIUM 9.4 05/07/2020 0739   ALKPHOS 81 05/07/2020 0739   AST 12 (L) 05/07/2020 0739   ALT 20 05/07/2020 0739   BILITOT 0.6 05/07/2020 0739        Impression and Plan:   70 year old with:  1.  Stage I CLL presented with cervical adenopathy and ATM deletion diagnosed in August 2020.     He continues to have excellent response to ibrutinib with decline in his lymphocytosis and cervical adenopathy.  Risks and benefits of continuing ibrutinib long-term were discussed.  Complications that include bleeding, palpitation,  GI toxicity among others.  Alternative treatment options would be systemic chemotherapy, different oral targeted therapy versus observation.  After discussion today, he is agreeable to continue and will reevaluate in 3 months.  Laboratory data from today reviewed and showed normalization of his white cell count and lymphocytosis and otherwise normal CBC.   2.  Hypertension: His blood pressure is within normal range and will continue to monitor.     3.  Follow-up: In 3 months for repeat follow-up.   30  minutes were dedicated to this visit.  Time spent on reviewing his disease status, reviewing laboratory data and future plan of care discussion.    [name], MD 3/31/20228:02 AM

## 2020-07-10 ENCOUNTER — Telehealth: Payer: Self-pay | Admitting: Oncology

## 2020-07-10 NOTE — Telephone Encounter (Signed)
Scheduled follow-up appointment per 3/31 los. Patient is aware.

## 2020-07-30 ENCOUNTER — Other Ambulatory Visit: Payer: Self-pay | Admitting: Oncology

## 2020-07-30 DIAGNOSIS — C9110 Chronic lymphocytic leukemia of B-cell type not having achieved remission: Secondary | ICD-10-CM

## 2020-08-20 ENCOUNTER — Other Ambulatory Visit: Payer: Self-pay | Admitting: Oncology

## 2020-08-20 DIAGNOSIS — C9110 Chronic lymphocytic leukemia of B-cell type not having achieved remission: Secondary | ICD-10-CM

## 2020-09-10 ENCOUNTER — Other Ambulatory Visit: Payer: Self-pay | Admitting: Oncology

## 2020-09-10 DIAGNOSIS — C9110 Chronic lymphocytic leukemia of B-cell type not having achieved remission: Secondary | ICD-10-CM

## 2020-10-08 ENCOUNTER — Inpatient Hospital Stay: Payer: 59 | Attending: Oncology | Admitting: Oncology

## 2020-10-08 ENCOUNTER — Inpatient Hospital Stay: Payer: 59

## 2020-10-08 ENCOUNTER — Other Ambulatory Visit: Payer: Self-pay

## 2020-10-08 VITALS — BP 143/77 | HR 89 | Temp 98.4°F | Resp 18 | Ht 72.0 in | Wt 227.4 lb

## 2020-10-08 DIAGNOSIS — L819 Disorder of pigmentation, unspecified: Secondary | ICD-10-CM | POA: Diagnosis not present

## 2020-10-08 DIAGNOSIS — C9110 Chronic lymphocytic leukemia of B-cell type not having achieved remission: Secondary | ICD-10-CM

## 2020-10-08 LAB — CBC WITH DIFFERENTIAL (CANCER CENTER ONLY)
Abs Immature Granulocytes: 0.05 10*3/uL (ref 0.00–0.07)
Basophils Absolute: 0.1 10*3/uL (ref 0.0–0.1)
Basophils Relative: 1 %
Eosinophils Absolute: 0.1 10*3/uL (ref 0.0–0.5)
Eosinophils Relative: 1 %
HCT: 51.3 % (ref 39.0–52.0)
Hemoglobin: 16.9 g/dL (ref 13.0–17.0)
Immature Granulocytes: 1 %
Lymphocytes Relative: 29 %
Lymphs Abs: 2.7 10*3/uL (ref 0.7–4.0)
MCH: 28.5 pg (ref 26.0–34.0)
MCHC: 32.9 g/dL (ref 30.0–36.0)
MCV: 86.5 fL (ref 80.0–100.0)
Monocytes Absolute: 0.5 10*3/uL (ref 0.1–1.0)
Monocytes Relative: 5 %
Neutro Abs: 5.8 10*3/uL (ref 1.7–7.7)
Neutrophils Relative %: 63 %
Platelet Count: 288 10*3/uL (ref 150–400)
RBC: 5.93 MIL/uL — ABNORMAL HIGH (ref 4.22–5.81)
RDW: 18.6 % — ABNORMAL HIGH (ref 11.5–15.5)
WBC Count: 9.2 10*3/uL (ref 4.0–10.5)
nRBC: 0 % (ref 0.0–0.2)

## 2020-10-08 LAB — CMP (CANCER CENTER ONLY)
ALT: 33 U/L (ref 0–44)
AST: 18 U/L (ref 15–41)
Albumin: 3.6 g/dL (ref 3.5–5.0)
Alkaline Phosphatase: 100 U/L (ref 38–126)
Anion gap: 11 (ref 5–15)
BUN: 21 mg/dL (ref 8–23)
CO2: 29 mmol/L (ref 22–32)
Calcium: 9.3 mg/dL (ref 8.9–10.3)
Chloride: 98 mmol/L (ref 98–111)
Creatinine: 0.94 mg/dL (ref 0.61–1.24)
GFR, Estimated: >60 mL/min (ref >60)
Glucose, Bld: 205 mg/dL — ABNORMAL HIGH (ref 70–99)
Potassium: 4.1 mmol/L (ref 3.5–5.1)
Sodium: 138 mmol/L (ref 135–145)
Total Bilirubin: 0.7 mg/dL (ref 0.3–1.2)
Total Protein: 6.6 g/dL (ref 6.5–8.1)

## 2020-10-08 NOTE — Progress Notes (Signed)
Hematology and Oncology Follow Up Visit  Douglas [name] [id number] 1950-06-09 70 y.o. 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  Current therapy: Ibrutinib 420 mg started in August 2021.  Interim History: Douglas [name] returns today for a follow-up visit.  Since her last visit, he has tolerated ibrutinib without any major complications.  He denies any nausea, vomiting or palpitation.  He denies any worsening lymphadenopathy or petechiae.  He denies any fevers or chills or sweats.          Medications: Reviewed without changes. Current Outpatient Medications  Medication Sig Dispense Refill   albuterol (PROVENTIL HFA;VENTOLIN HFA) 108 (90 BASE) MCG/ACT inhaler Inhale 2 puffs into the lungs 4 (four) times daily. (Patient taking differently: Inhale 2 puffs into the lungs every 6 (six) hours as needed.) 1 Inhaler 0   amLODipine (NORVASC) 5 MG tablet Take 5 mg by mouth daily.     azelastine (ASTELIN) 0.1 % nasal spray Place 1 spray into both nostrils as needed for rhinitis. Use in each nostril as directed     IMBRUVICA 420 MG TABS TAKE 1 TABLET BY MOUTH ONCE DAILY WITH A FULL GLASS OF  WATER 28 tablet 0   loratadine (CLARITIN) 10 MG tablet Take 10 mg by mouth daily as needed.      montelukast (SINGULAIR) 10 MG tablet Take 10 mg by mouth as needed.     terazosin (HYTRIN) 10 MG capsule Take 10 mg by mouth at bedtime.     valsartan-hydrochlorothiazide (DIOVAN-HCT) 320-25 MG tablet Take 1 tablet by mouth daily.     No current facility-administered medications for this visit.     Allergies: No Known Allergies      Physical Exam:    Blood pressure (!) 143/77, pulse 89, temperature 98.4 F (36.9 C), temperature source Tympanic, resp. rate 18, height 6' (1.829 m), weight 227 lb 6.4 oz (103.1 kg), SpO2  97 %.    ECOG: 0    General appearance: Alert, awake without any distress. Head: Atraumatic without abnormalities Oropharynx: Without any thrush or ulcers. Eyes: No scleral icterus. Lymph nodes: No lymphadenopathy noted in the cervical, supraclavicular, or axillary nodes Heart:regular rate and rhythm, without any murmurs or gallops.   Lung: Clear to auscultation without any rhonchi, wheezes or dullness to percussion. Abdomin: Soft, nontender without any shifting dullness or ascites. Musculoskeletal: No clubbing or cyanosis. Neurological: No motor or sensory deficits. Skin: No rashes or lesions.          Lab Results: Lab Results  Component Value Date   WBC 9.2 10/08/2020   HGB 16.9 10/08/2020   HCT 51.3 10/08/2020   MCV 86.5 10/08/2020   PLT 288 10/08/2020     Chemistry      Component Value Date/Time   NA 139 07/09/2020 0753   K 3.6 07/09/2020 0753   CL 101 07/09/2020 0753   CO2 25 07/09/2020 0753   BUN 19 07/09/2020 0753   CREATININE 0.89 07/09/2020 0753      Component Value Date/Time   CALCIUM 8.6 (L) 07/09/2020 0753   ALKPHOS 97 07/09/2020 0753   AST 23 07/09/2020 0753   ALT 44 07/09/2020 0753   BILITOT 0.6 07/09/2020 0753        Impression and Plan:   70 year old with:  1.  CLL diagnosed in August 2020.  He presented with stage I CLL with cervical adenopathy and ATM deletion.    He has tolerated ibrutinib without any major complications and he is considering therapy interruption.  He has not renewed the last prescription and wondering if he can take a break.  Risks and benefits of continuing this medication long-term versus a treatment break was reviewed.  He has achieved a complete hematological response at this time with resolution of his cervical adenopathy.  After discussion today he we opted to give him a treatment break and reevaluate in 3 months.  He understands that resumption of therapy may be needed he developed relapsed disease which is  likely to happen.   2.  Pigmented lesion on his nose.  I recommended dermatology follow-up.     3.  Follow-up: He will return in 3 months for repeat evaluation.   30  minutes were spent on this encounter.  The time was dedicated to reviewing laboratory data, disease status update, treatment choices and future plan of care discussion.    [name], MD 6/30/20228:33 AM

## 2020-10-14 ENCOUNTER — Telehealth: Payer: Self-pay | Admitting: Oncology

## 2020-10-14 ENCOUNTER — Telehealth: Payer: Self-pay | Admitting: *Deleted

## 2020-10-14 NOTE — Telephone Encounter (Signed)
PC to patient, informed him of Dr.Shadad's comments as below.  Patient instructed to continue to monitor his symptoms and to call our office if his symptoms worsen or if new symptoms develop.  Patient verbalizes understanding.

## 2020-10-14 NOTE — Telephone Encounter (Signed)
R/s appt per 7/6 sch msg. Pt aware.  

## 2020-10-14 NOTE — Telephone Encounter (Signed)
-----   Message from Wyatt Portela, MD sent at 10/14/2020 10:01 AM EDT ----- I do not think the symptoms have anything to do with stopping the medication.  I recommend staying off of it and continue to monitor.  Thanks ----- Message ----- From: Rolene Course, RN Sent: 10/14/2020   9:58 AM EDT To: Wyatt Portela, MD  This patient called, said he saw you last week & he decided to stop his ibrutinib.  Since that time, over the weekend, he C/O extreme night sweats, nausea, headaches, dizziness almost to the point of passing out.  He is wondering if this could be because he stopped the medication & is asking for your thoughts.  Please advise.  Bethena Roys

## 2020-11-02 ENCOUNTER — Other Ambulatory Visit: Payer: Self-pay | Admitting: *Deleted

## 2020-11-02 ENCOUNTER — Telehealth: Payer: Self-pay | Admitting: *Deleted

## 2020-11-02 DIAGNOSIS — C9110 Chronic lymphocytic leukemia of B-cell type not having achieved remission: Secondary | ICD-10-CM

## 2020-11-02 MED ORDER — IMBRUVICA 420 MG PO TABS: ORAL_TABLET | ORAL | 1 refills | Status: DC

## 2020-11-02 NOTE — Telephone Encounter (Signed)
Patient called requesting refill of Imbruvica to [facility].  He states he was given the option to withhold treatment at his last visit but states that the swelling in his neck returned and he restarted about 2 weeks ago and that has since improved.  He does not have anymore refills and states he wants to stay on the medication.  Routed to Dr Alen Blew for refill.

## 2020-12-04 ENCOUNTER — Telehealth: Payer: Self-pay | Admitting: Oncology

## 2020-12-04 NOTE — Telephone Encounter (Signed)
Called patient regarding upcoming September appointment, patient has been called and notified.  

## 2020-12-07 ENCOUNTER — Telehealth: Payer: Self-pay | Admitting: Oncology

## 2020-12-07 NOTE — Telephone Encounter (Signed)
Rescheduled 09/29 appointment to 10/19 per patient's request.

## 2020-12-22 ENCOUNTER — Other Ambulatory Visit: Payer: Self-pay | Admitting: Oncology

## 2020-12-22 DIAGNOSIS — C9110 Chronic lymphocytic leukemia of B-cell type not having achieved remission: Secondary | ICD-10-CM

## 2020-12-25 ENCOUNTER — Other Ambulatory Visit (HOSPITAL_COMMUNITY): Payer: Self-pay

## 2021-01-07 ENCOUNTER — Other Ambulatory Visit: Payer: 59

## 2021-01-07 ENCOUNTER — Ambulatory Visit: Payer: 59 | Admitting: Oncology

## 2021-01-08 ENCOUNTER — Other Ambulatory Visit: Payer: 59

## 2021-01-08 ENCOUNTER — Ambulatory Visit: Payer: 59 | Admitting: Oncology

## 2021-01-16 ENCOUNTER — Encounter: Payer: Self-pay | Admitting: Emergency Medicine

## 2021-01-16 ENCOUNTER — Other Ambulatory Visit: Payer: Self-pay

## 2021-01-16 ENCOUNTER — Encounter (HOSPITAL_COMMUNITY): Payer: Self-pay | Admitting: Emergency Medicine

## 2021-01-16 ENCOUNTER — Emergency Department (HOSPITAL_COMMUNITY): Payer: 59

## 2021-01-16 ENCOUNTER — Inpatient Hospital Stay (HOSPITAL_COMMUNITY)
Admission: EM | Admit: 2021-01-16 | Discharge: 2021-01-20 | DRG: 243 | Disposition: A | Discharge status: Home / Self Care | Payer: 59 | Attending: Student in an Organized Health Care Education/Training Program | Admitting: Student in an Organized Health Care Education/Training Program

## 2021-01-16 DIAGNOSIS — Z95810 Presence of automatic (implantable) cardiac defibrillator: Secondary | ICD-10-CM

## 2021-01-16 DIAGNOSIS — R778 Other specified abnormalities of plasma proteins: Secondary | ICD-10-CM | POA: Diagnosis not present

## 2021-01-16 DIAGNOSIS — I440 Atrioventricular block, first degree: Secondary | ICD-10-CM | POA: Diagnosis not present

## 2021-01-16 DIAGNOSIS — I447 Left bundle-branch block, unspecified: Secondary | ICD-10-CM | POA: Diagnosis present

## 2021-01-16 DIAGNOSIS — C9110 Chronic lymphocytic leukemia of B-cell type not having achieved remission: Secondary | ICD-10-CM | POA: Diagnosis present

## 2021-01-16 DIAGNOSIS — Z79899 Other long term (current) drug therapy: Secondary | ICD-10-CM

## 2021-01-16 DIAGNOSIS — Z8249 Family history of ischemic heart disease and other diseases of the circulatory system: Secondary | ICD-10-CM

## 2021-01-16 DIAGNOSIS — I10 Essential (primary) hypertension: Secondary | ICD-10-CM

## 2021-01-16 DIAGNOSIS — Z801 Family history of malignant neoplasm of trachea, bronchus and lung: Secondary | ICD-10-CM | POA: Diagnosis not present

## 2021-01-16 DIAGNOSIS — Z20822 Contact with and (suspected) exposure to covid-19: Secondary | ICD-10-CM | POA: Diagnosis present

## 2021-01-16 DIAGNOSIS — I2510 Atherosclerotic heart disease of native coronary artery without angina pectoris: Secondary | ICD-10-CM | POA: Diagnosis present

## 2021-01-16 DIAGNOSIS — R079 Chest pain, unspecified: Secondary | ICD-10-CM | POA: Diagnosis not present

## 2021-01-16 DIAGNOSIS — R001 Bradycardia, unspecified: Secondary | ICD-10-CM | POA: Diagnosis not present

## 2021-01-16 DIAGNOSIS — E119 Type 2 diabetes mellitus without complications: Secondary | ICD-10-CM

## 2021-01-16 DIAGNOSIS — R55 Syncope and collapse: Secondary | ICD-10-CM | POA: Diagnosis not present

## 2021-01-16 DIAGNOSIS — E785 Hyperlipidemia, unspecified: Secondary | ICD-10-CM | POA: Diagnosis present

## 2021-01-16 DIAGNOSIS — R7989 Other specified abnormal findings of blood chemistry: Secondary | ICD-10-CM

## 2021-01-16 DIAGNOSIS — I441 Atrioventricular block, second degree: Secondary | ICD-10-CM

## 2021-01-16 DIAGNOSIS — I110 Hypertensive heart disease with heart failure: Secondary | ICD-10-CM | POA: Diagnosis present

## 2021-01-16 DIAGNOSIS — I214 Non-ST elevation (NSTEMI) myocardial infarction: Secondary | ICD-10-CM | POA: Diagnosis not present

## 2021-01-16 DIAGNOSIS — Z87891 Personal history of nicotine dependence: Secondary | ICD-10-CM

## 2021-01-16 DIAGNOSIS — N400 Enlarged prostate without lower urinary tract symptoms: Secondary | ICD-10-CM | POA: Diagnosis present

## 2021-01-16 DIAGNOSIS — I442 Atrioventricular block, complete: Secondary | ICD-10-CM | POA: Diagnosis not present

## 2021-01-16 DIAGNOSIS — Z7982 Long term (current) use of aspirin: Secondary | ICD-10-CM

## 2021-01-16 DIAGNOSIS — E1169 Type 2 diabetes mellitus with other specified complication: Secondary | ICD-10-CM

## 2021-01-16 HISTORY — DX: Benign prostatic hyperplasia without lower urinary tract symptoms: N40.0

## 2021-01-16 HISTORY — DX: Chronic lymphocytic leukemia of B-cell type not having achieved remission: C91.10

## 2021-01-16 HISTORY — DX: Essential (primary) hypertension: I10

## 2021-01-16 LAB — CBC WITH DIFFERENTIAL/PLATELET
Abs Immature Granulocytes: 0.13 10*3/uL — ABNORMAL HIGH (ref 0.00–0.07)
Basophils Absolute: 0.1 10*3/uL (ref 0.0–0.1)
Basophils Relative: 1 %
Eosinophils Absolute: 0 10*3/uL (ref 0.0–0.5)
Eosinophils Relative: 0 %
HCT: 46.7 % (ref 39.0–52.0)
Hemoglobin: 15.5 g/dL (ref 13.0–17.0)
Immature Granulocytes: 1 %
Lymphocytes Relative: 16 %
Lymphs Abs: 1.9 10*3/uL (ref 0.7–4.0)
MCH: 30.8 pg (ref 26.0–34.0)
MCHC: 33.2 g/dL (ref 30.0–36.0)
MCV: 92.7 fL (ref 80.0–100.0)
Monocytes Absolute: 0.4 10*3/uL (ref 0.1–1.0)
Monocytes Relative: 3 %
Neutro Abs: 9.4 10*3/uL — ABNORMAL HIGH (ref 1.7–7.7)
Neutrophils Relative %: 79 %
Platelets: 315 10*3/uL (ref 150–400)
RBC: 5.04 MIL/uL (ref 4.22–5.81)
RDW: 16.9 % — ABNORMAL HIGH (ref 11.5–15.5)
WBC: 11.8 10*3/uL — ABNORMAL HIGH (ref 4.0–10.5)
nRBC: 0 % (ref 0.0–0.2)

## 2021-01-16 LAB — BRAIN NATRIURETIC PEPTIDE: B Natriuretic Peptide: 59.4 pg/mL (ref 0.0–100.0)

## 2021-01-16 LAB — TROPONIN I (HIGH SENSITIVITY)
Troponin I (High Sensitivity): 72 ng/L — ABNORMAL HIGH (ref <18)
Troponin I (High Sensitivity): 296 ng/L (ref <18)

## 2021-01-16 LAB — BASIC METABOLIC PANEL
Anion gap: 12 (ref 5–15)
BUN: 24 mg/dL — ABNORMAL HIGH (ref 8–23)
CO2: 27 mmol/L (ref 22–32)
Calcium: 9 mg/dL (ref 8.9–10.3)
Chloride: 98 mmol/L (ref 98–111)
Creatinine, Ser: 1.03 mg/dL (ref 0.61–1.24)
GFR, Estimated: >60 mL/min (ref >60)
Glucose, Bld: 202 mg/dL — ABNORMAL HIGH (ref 70–99)
Potassium: 4 mmol/L (ref 3.5–5.1)
Sodium: 137 mmol/L (ref 135–145)

## 2021-01-16 LAB — LIPID PANEL
Cholesterol: 168 mg/dL (ref 0–200)
HDL: 44 mg/dL (ref >40)
LDL Cholesterol: 111 mg/dL — ABNORMAL HIGH (ref 0–99)
Total CHOL/HDL Ratio: 3.8 RATIO
Triglycerides: 63 mg/dL (ref <150)
VLDL: 13 mg/dL (ref 0–40)

## 2021-01-16 LAB — LIPASE, BLOOD: Lipase: 36 U/L (ref 11–51)

## 2021-01-16 LAB — PROTIME-INR
INR: 1 (ref 0.8–1.2)
Prothrombin Time: 13.4 seconds (ref 11.4–15.2)

## 2021-01-16 LAB — TSH: TSH: 3.698 u[IU]/mL (ref 0.350–4.500)

## 2021-01-16 LAB — CBG MONITORING, ED: Glucose-Capillary: 198 mg/dL — ABNORMAL HIGH (ref 70–99)

## 2021-01-16 LAB — HEMOGLOBIN A1C
Hgb A1c MFr Bld: 6.7 % — ABNORMAL HIGH (ref 4.8–5.6)
Mean Plasma Glucose: 145.59 mg/dL

## 2021-01-16 LAB — APTT: aPTT: 52 seconds — ABNORMAL HIGH (ref 24–36)

## 2021-01-16 LAB — LACTIC ACID, PLASMA: Lactic Acid, Venous: 2.7 mmol/L (ref 0.5–1.9)

## 2021-01-16 MED ORDER — SODIUM CHLORIDE 0.9 % IV BOLUS
1000.0000 mL | Freq: Once | INTRAVENOUS | Status: AC
  Administered 2021-01-16: 1000 mL via INTRAVENOUS

## 2021-01-16 MED ORDER — SODIUM CHLORIDE 0.9 % IV SOLN: INTRAVENOUS | Status: DC

## 2021-01-16 MED ORDER — ASPIRIN 300 MG RE SUPP: 300.0000 mg | RECTAL | Status: AC

## 2021-01-16 MED ORDER — ASPIRIN EC 81 MG PO TBEC
81.0000 mg | DELAYED_RELEASE_TABLET | Freq: Every day | ORAL | Status: DC
  Administered 2021-01-17 – 2021-01-20 (×3): 81 mg via ORAL
  Filled 2021-01-16 (×2): qty 1

## 2021-01-16 MED ORDER — ASPIRIN EC 325 MG PO TBEC
325.0000 mg | DELAYED_RELEASE_TABLET | Freq: Once | ORAL | Status: AC
  Administered 2021-01-16: 325 mg via ORAL
  Filled 2021-01-16: qty 1

## 2021-01-16 MED ORDER — LIDOCAINE VISCOUS HCL 2 % MT SOLN
15.0000 mL | Freq: Once | OROMUCOSAL | Status: AC
  Administered 2021-01-16: 15 mL via ORAL
  Filled 2021-01-16: qty 15

## 2021-01-16 MED ORDER — ACETAMINOPHEN 325 MG PO TABS
650.0000 mg | ORAL_TABLET | ORAL | Status: DC | PRN
  Administered 2021-01-19: 650 mg via ORAL
  Filled 2021-01-16: qty 2

## 2021-01-16 MED ORDER — ONDANSETRON HCL 4 MG/2ML IJ SOLN: 4.0000 mg | Freq: Four times a day (QID) | INTRAMUSCULAR | Status: DC | PRN

## 2021-01-16 MED ORDER — ASPIRIN 81 MG PO CHEW
324.0000 mg | CHEWABLE_TABLET | ORAL | Status: AC
  Administered 2021-01-16: 324 mg via ORAL
  Filled 2021-01-16: qty 4

## 2021-01-16 MED ORDER — TERAZOSIN HCL 5 MG PO CAPS
10.0000 mg | ORAL_CAPSULE | Freq: Every day | ORAL | Status: DC
  Administered 2021-01-17 – 2021-01-20 (×4): 10 mg via ORAL
  Filled 2021-01-16 (×5): qty 2

## 2021-01-16 MED ORDER — HEPARIN BOLUS VIA INFUSION
4000.0000 [IU] | Freq: Once | INTRAVENOUS | Status: AC
  Administered 2021-01-16: 4000 [IU] via INTRAVENOUS
  Filled 2021-01-16: qty 4000

## 2021-01-16 MED ORDER — HYDROCHLOROTHIAZIDE 25 MG PO TABS
25.0000 mg | ORAL_TABLET | Freq: Every day | ORAL | Status: DC
  Administered 2021-01-17: 25 mg via ORAL
  Filled 2021-01-16: qty 1

## 2021-01-16 MED ORDER — IRBESARTAN 300 MG PO TABS
300.0000 mg | ORAL_TABLET | Freq: Every day | ORAL | Status: DC
  Administered 2021-01-17: 300 mg via ORAL
  Filled 2021-01-16: qty 2
  Filled 2021-01-16: qty 1

## 2021-01-16 MED ORDER — MORPHINE SULFATE (PF) 2 MG/ML IV SOLN: 2.0000 mg | Freq: Once | INTRAVENOUS | Status: DC

## 2021-01-16 MED ORDER — NITROGLYCERIN 2 % TD OINT
0.5000 [in_us] | TOPICAL_OINTMENT | Freq: Once | TRANSDERMAL | Status: DC
  Filled 2021-01-16: qty 30

## 2021-01-16 MED ORDER — HEPARIN (PORCINE) 25000 UT/250ML-% IV SOLN
1850.0000 [IU]/h | INTRAVENOUS | Status: DC
  Administered 2021-01-16: 1200 [IU]/h via INTRAVENOUS
  Administered 2021-01-17: 1400 [IU]/h via INTRAVENOUS
  Administered 2021-01-18: 1850 [IU]/h via INTRAVENOUS
  Filled 2021-01-16 (×3): qty 250

## 2021-01-16 MED ORDER — ALUM & MAG HYDROXIDE-SIMETH 200-200-20 MG/5ML PO SUSP
30.0000 mL | Freq: Once | ORAL | Status: AC
  Administered 2021-01-16: 30 mL via ORAL
  Filled 2021-01-16: qty 30

## 2021-01-16 MED ORDER — AMLODIPINE BESYLATE 5 MG PO TABS
5.0000 mg | ORAL_TABLET | Freq: Every day | ORAL | Status: DC
  Administered 2021-01-17 – 2021-01-20 (×4): 5 mg via ORAL
  Filled 2021-01-16 (×4): qty 1

## 2021-01-16 MED ORDER — NITROGLYCERIN 0.4 MG SL SUBL: 0.4000 mg | SUBLINGUAL_TABLET | SUBLINGUAL | Status: DC | PRN

## 2021-01-16 MED ORDER — VALSARTAN-HYDROCHLOROTHIAZIDE 320-25 MG PO TABS: 1.0000 | ORAL_TABLET | Freq: Every day | ORAL | Status: DC

## 2021-01-16 MED ORDER — METOPROLOL TARTRATE 25 MG PO TABS
25.0000 mg | ORAL_TABLET | Freq: Two times a day (BID) | ORAL | Status: DC
  Administered 2021-01-16: 25 mg via ORAL
  Filled 2021-01-16 (×2): qty 1

## 2021-01-16 MED ORDER — ALBUTEROL SULFATE HFA 108 (90 BASE) MCG/ACT IN AERS
2.0000 | INHALATION_SPRAY | Freq: Four times a day (QID) | RESPIRATORY_TRACT | Status: DC | PRN
  Filled 2021-01-16: qty 6.7

## 2021-01-16 MED ORDER — ATORVASTATIN CALCIUM 80 MG PO TABS
80.0000 mg | ORAL_TABLET | Freq: Every day | ORAL | Status: DC
  Administered 2021-01-16: 80 mg via ORAL
  Filled 2021-01-16: qty 2

## 2021-01-16 MED ORDER — ATORVASTATIN CALCIUM 80 MG PO TABS
80.0000 mg | ORAL_TABLET | Freq: Every day | ORAL | Status: DC
  Administered 2021-01-17 – 2021-01-19 (×3): 80 mg via ORAL
  Filled 2021-01-16 (×3): qty 1
  Filled 2021-01-16: qty 2

## 2021-01-16 NOTE — Progress Notes (Signed)
ANTICOAGULATION CONSULT NOTE - Initial Consult  Pharmacy Consult for Heparin Indication: chest pain/ACS  No Known Allergies  Patient Measurements: Height: 6' (182.9 cm) Weight: 98.9 kg (218 lb) IBW/kg (Calculated) : 77.6 Heparin Dosing Weight: 97.6 kg  Vital Signs: Temp: 97.7 F (36.5 C) (10/08 1407) Temp Source: Oral (10/08 1407) BP: 175/87 (10/08 1830) Pulse Rate: 80 (10/08 1830)  Labs: Recent Labs    01/16/21 1410 01/16/21 1608  HGB 15.5  --   HCT 46.7  --   PLT 315  --   CREATININE 1.03  --   TROPONINIHS 72* 296*    Estimated Creatinine Clearance: 78.9 mL/min (by C-G formula based on SCr of 1.03 mg/dL).   Medical History: Past Medical History:  Diagnosis Date   BPH (benign prostatic hyperplasia)    CLL (chronic lymphocytic leukemia) (HCC)    Hypertension     Medications:  (Not in a hospital admission)  Scheduled:    morphine injection  2 mg Intravenous Once   nitroGLYCERIN  0.5 inch Topical Once   Infusions:   sodium chloride 125 mL/hr at 01/16/21 1545   PRN:   Assessment: 47 yom with a history of HTN, CLL and BPH . Patient is presenting with chest pain. Heparin per pharmacy consult placed for chest pain/ACS.  Patient is on not on anticoagulation prior to arrival.  Hgb15.5;plt 315  Goal of Therapy:  Heparin level 0.3-0.7 units/ml Monitor platelets by anticoagulation protocol: Yes   Plan:  Give 4000 units bolus x 1 Start heparin infusion at 1200 units/hr Check anti-Xa level in 8 hours and daily while on heparin Continue to monitor H&H and platelets  Lorelei Pont, PharmD, BCPS 01/16/2021 6:39 PM ED Clinical Pharmacist -  [phone]

## 2021-01-16 NOTE — ED Provider Notes (Signed)
Patient seen after prior EDP.  Troponins are trending upward.  NSTEMI is suspected.  Patient appears to be comfortable.  Patient understands plan of care.   Cardiology is aware of case and will plan to admit.   Valarie Merino, MD 01/16/21 1739

## 2021-01-16 NOTE — H&P (Addendum)
Cardiology Admission History and Physical:   Patient ID: Douglas [name] MRN: [id number]; DOB: 1950-10-29   Admission date: 01/16/2021  Primary Care Provider: [name], MD [facility] Cardiologist: None  [facility] Electrophysiologist:  None   Chief Complaint: CP  Patient Profile:   Douglas [name] is a 70 y.o. male with HTN, CLL and BPH who presents with NSTEMI.   History of Present Illness:   Douglas [name] had symptom onset at 12:00 on 01/16/2021 while he was in the kitchen washing dishes.  He initially felt dizziness/lightheadedness followed by cold sweats, diaphoresis, SOB and central nonradiating substernal chest pain which he rated as a 4-5/10 severity.  He became nauseous and tired within around 60 seconds.  All the symptoms started within 1 minute and lasted between 1-1.5 hours.  He initially thought that his chest pain was related to reflux symptoms as he had eaten lasagna the night before and is very rarely had similar epigastric pain which occurs 1-2 times per year and has occurred for the past 10+ years without increasing frequency, severity, or change in character.  None of his symptoms were exertional.  After initial symptom onset he went to sit down in the living room however none of his symptoms improved for at least the hour.  He described the chest pain as a small area in the center of his chest and was mainly pain not pressure.  He also described some vague itching over his chest and shoulders during the same time.  His wife arrived after around 45 minutes and said that he looked very pale and diaphoretic at which point they called 911.  EMS arrived at 1330 with rhythm strip concerning for anteroseptal STEMI with LBBB and unknown prior for comparison.  Initially EMS had called for STEMI however it was subsequently canceled.  All symptoms on arrival to the ED had completely resolved.  ECG on admission with LBBB not meeting criteria for STEMI and with unknown  prior.  During my evaluation (1733) he had had recent recurrence of mild epigastric/chest discomfort for which he was given Tums and GI cocktail with symptom relief.  He had not received any nitrates prior to symptom resolution.  VS 184/91 (118), HR 82, SpO2 98, RR 20   His BP at home is fairly well controlled and he says very rarely goes greater than systolic 599.  Even when he is stressed he never has blood pressure above 150.  He has been adherent to all of his medications and has not missed any recent doses.  He is on valsartan/HCTZ and amlodipine for blood pressure control and took both this morning.  Family history includes father who passed at 34 who had multiple medical comorbidities including cancer of unknown etiology.  Subsequent autopsy revealed that he had underlying coronary disease but unknown if this was a cause of death.  Mother passed at 25 from lung cancer with a long smoking history but no known coronary disease.  Patient and wife have no children.  No other family history of SCD or premature CAD.  He has distant smoking history of 5 to 10 pack years but stopped over 40 years ago and has DM2 diet controlled (A1c 6.7, no insulin).   Douglas [name] was diagnosed with CLL in 2020 after presenting with significant cervical lymphadenopathy along with leukocytosis for which she was subsequently found to have CLL and is now on ibrutinib.  WBC has been stable on ibrutinib and he has had no issues with bleeding.  He  is not on antiplatelet or OAC.  He follows with Dr. [name] at [facility]. Dr. [name] is his PCP at [facility].   Past Medical History:  Diagnosis Date   BPH (benign prostatic hyperplasia)    CLL (chronic lymphocytic leukemia) ([place])    Hypertension    History reviewed. No pertinent surgical history.   Medications Prior to Admission: Prior to Admission medications   Medication Sig Start Date End Date Taking? Authorizing Provider  albuterol (PROVENTIL HFA;VENTOLIN  HFA) 108 (90 BASE) MCG/ACT inhaler Inhale 2 puffs into the lungs 4 (four) times daily. Patient taking differently: Inhale 2 puffs into the lungs every 6 (six) hours as needed for shortness of breath or wheezing. 07/18/13  Yes Harden Mo, MD  amLODipine (NORVASC) 5 MG tablet Take 5 mg by mouth daily.   Yes [provider]  azelastine (ASTELIN) 0.1 % nasal spray Place 1 spray into both nostrils as needed for rhinitis. Use in each nostril as directed   Yes [provider]  IMBRUVICA 420 MG TABS TAKE 1 TABLET BY MOUTH ONCE DAILY WITH A FULL GLASS OF  WATER 12/22/20  Yes Shadad, Mathis Dad, MD  loratadine (CLARITIN) 10 MG tablet Take 10 mg by mouth daily as needed.    Yes [provider]  montelukast (SINGULAIR) 10 MG tablet Take 10 mg by mouth as needed.   Yes [provider]  terazosin (HYTRIN) 10 MG capsule Take 10 mg by mouth at bedtime.   Yes [provider]  valsartan-hydrochlorothiazide (DIOVAN-HCT) 320-25 MG tablet Take 1 tablet by mouth daily.   Yes [provider]    Allergies:   No Known Allergies  Social History:   Social History   Socioeconomic History   Marital status: Married    Spouse name: Not on file   Number of children: Not on file   Years of education: Not on file   Highest education level: Not on file  Occupational History   Not on file  Tobacco Use   Smoking status: Former   Smokeless tobacco: Never  Substance and Sexual Activity   Alcohol use: Not on file   Drug use: Not on file   Sexual activity: Not on file  Other Topics Concern   Not on file  Social History Narrative   Not on file   Social Determinants of Health   Financial Resource Strain: Not on file  Food Insecurity: Not on file  Transportation Needs: Not on file  Physical Activity: Not on file  Stress: Not on file  Social Connections: Not on file  Intimate Partner Violence: Not on file    Family History:  The patient's family history includes  Coronary artery disease in his father; Lung cancer in his mother.    ROS:   Review of Systems: [y] = yes, [ ]  = no      General: Weight gain [ ] ; Weight loss [ ] ; Anorexia [ ] ; Fatigue [ ] ; Fever [ ] ; Chills [ ] ; Weakness [ ]    Cardiac: Chest pain/pressure [y]; Resting SOB [y]; Exertional SOB [ ] ; Orthopnea [ ] ; Pedal Edema [ ] ; Palpitations [ ] ; Syncope [ ] ; Presyncope [y]; Paroxysmal nocturnal dyspnea [ ]    Pulmonary: Cough [ ] ; Wheezing [ ] ; Hemoptysis [ ] ; Sputum [ ] ; Snoring [ ]    GI: Vomiting [ ] ; Dysphagia [ ] ; Melena [ ] ; Hematochezia [ ] ; Heartburn [y]; Abdominal pain [ ] ; Constipation [ ] ; Diarrhea [ ] ; BRBPR [ ]   GU: Hematuria [ ] ; Dysuria [ ] ; Nocturia [ ]  Vascular: Pain in legs with walking [ ] ; Pain in feet with lying flat [ ] ; Non-healing sores [ ] ; Stroke [ ] ; TIA [ ] ; Slurred speech [ ] ;   Neuro: Headaches [ ] ; Vertigo [ ] ; Seizures [ ] ; Paresthesias [ ] ;Blurred vision [ ] ; Diplopia [ ] ; Vision changes [ ]    Ortho/Skin: Arthritis [ ] ; Joint pain [ ] ; Muscle pain [ ] ; Joint swelling [ ] ; Back Pain [ ] ; Rash [ ]    Psych: Depression [ ] ; Anxiety [ ]    Heme: Bleeding problems [ ] ; Clotting disorders [ ] ; Anemia [ ]    Endocrine: Diabetes [ ] ; Thyroid dysfunction [ ]    Physical Exam/Data:   Vitals:   01/16/21 1745 01/16/21 1800 01/16/21 1815 01/16/21 1830  BP: (!) 181/79 (!) 184/91 (!) 167/87 (!) 175/87  Pulse: 85 86 81 80  Resp:  17 17 (!) 21  Temp:      TempSrc:      SpO2: 96% 97% 97% 97%  Weight:      Height:        Intake/Output Summary (Last 24 hours) at 01/16/2021 2034 Last data filed at 01/16/2021 1547 Gross per 24 hour  Intake 1000 ml  Output --  Net 1000 ml   Last 3 Weights 01/16/2021 10/08/2020 07/09/2020  Weight (lbs) 218 lb 227 lb 6.4 oz 222 lb 1.6 oz  Weight (kg) 98.884 kg 103.148 kg 100.744 kg     Body mass index is 29.57 kg/m.  General:  Well nourished, well developed, in no acute distress HEENT: normal Lymph: no adenopathy Neck: no  JVD Endocrine:  No thryomegaly Vascular: No carotid bruits; FA pulses 2+ bilaterally without bruits  Cardiac:  normal S1, S2; RRR; no murmur  Lungs:  clear to auscultation bilaterally, no wheezing, rhonchi or rales  Abd: soft, nontender, no hepatomegaly  Ext: no edema Musculoskeletal:  No deformities, BUE and BLE strength normal and equal Skin: warm and dry  Neuro:  CNs 2-12 intact, no focal abnormalities noted Psych:  Normal affect   EKG:  The ECG that was done 01/16/21 (17:50:08) was personally reviewed and demonstrates NSR 83, QRS 166, Qtc 546, PVC, LBBB  Relevant CV Studies: None   Laboratory Data:  High Sensitivity Troponin:   Recent Labs  Lab 01/16/21 1410 01/16/21 1608  TROPONINIHS 72* 296*      Chemistry Recent Labs  Lab 01/16/21 1410  NA 137  K 4.0  CL 98  CO2 27  GLUCOSE 202*  BUN 24*  CREATININE 1.03  CALCIUM 9.0  GFRNONAA >60  ANIONGAP 12    No results for input(s): PROT, ALBUMIN, AST, ALT, ALKPHOS, BILITOT in the last 168 hours. Hematology Recent Labs  Lab 01/16/21 1410  WBC 11.8*  RBC 5.04  HGB 15.5  HCT 46.7  MCV 92.7  MCH 30.8  MCHC 33.2  RDW 16.9*  PLT 315   BNP Recent Labs  Lab 01/16/21 1410  BNP 59.4    DDimer No results for input(s): DDIMER in the last 168 hours.  Radiology/Studies:  DG Chest Portable 1 View  Result Date: 01/16/2021 CLINICAL DATA:  Chest pain EXAM: PORTABLE CHEST 1 VIEW COMPARISON:  02/27/2014 FINDINGS: No focal consolidation. No pleural effusion or pneumothorax. Heart and mediastinal contours are unremarkable. No acute osseous abnormality. Severe osteoarthritis of bilateral glenohumeral joints. IMPRESSION: No active disease. Electronically Signed   By: Kathreen Devoid M.D.   On: 01/16/2021 14:36   {  TIMI Risk Score for Unstable Angina or Non-ST Elevation MI:   The patient's TIMI risk score is 2, which indicates a 8% risk of all cause mortality, new or recurrent myocardial infarction or need for urgent  revascularization in the next 14 days.{  Assessment and Plan:   NSTEMI Patient presents with multiple symptoms however chest pain was central among these and was the main thing that alarmed him.  He is not a person that has frequent episodes of similar chest discomfort and has no other reason to have elevated troponin with delta.  Risk factors include HTN, DM2, HLD (LDL 111) and distant smoking history, and possible family history of premature CAD.  Rhythm strip with EMS was concerning for anteroseptal STEMI with LBBB with concordant elevations in V1-V3 however on arrival patient symptoms free and ECG with dynamic improvements.  He recalls having prior ECG with his PCP at [facility] however not in our system for comparison.  Given risk factors and presentation along with troponin elevation will proceed with NSTEMI treatment and likely cardiac catheterization on Monday.  - s/p ASA 325 mg PO once (10/08, 1545), start ASA 81 mg PO daily  - start metop tartrate 25 mg PO bid (HR 83) - transition home valsartan/HCTZ 320-25 mg PO daily to formulary irbesartan 300 mg PO daily+HCTZ 25 mg PO daily - start atorva 80 mg PO qhs  - hold P2Y12i - TTE ordered   HTN - temporary transition to formularly alternative as above - likely HTN s/p NSTEMI/sympathetic drive, will not further uptitrate right now   DM2 - A1c today (6.8), diet controlled for now - OP start of metformin or SGLT2i  BPH - continue home terazosin 10 mg PO daily   CLL - hold ibrutinib 420 mg PO daily given above NSTEMI and need for likely DAPT, likely needs minimum DAPT x1 year, will await cath, if so would discuss ibrutinib with Dr. Alen Blew. Most of risk appears to be with initiation and not on chronic ibrutinib when combined with DAPT however it does affect platelet aggregation so there is increased risk of bleeding if he requires chronic DAPT which is likely. Either way hold until after cath, not on formulary but patient has tablets  with him (LD 10/08 AM)  Severity of Illness: The appropriate patient status for this patient is INPATIENT. Inpatient status is judged to be reasonable and necessary in order to provide the required intensity of service to ensure the patient's safety. The patient's presenting symptoms, physical exam findings, and initial radiographic and laboratory data in the context of their chronic comorbidities is felt to place them at high risk for further clinical deterioration. Furthermore, it is not anticipated that the patient will be medically stable for discharge from the hospital within 2 midnights of admission. The following factors support the patient status of inpatient.   " The patient's presenting symptoms include chest pain. " The worrisome physical exam findings included chest pain. " The initial radiographic and laboratory data are worrisome because of elevated troponins.  " The chronic co-morbidities include HTN, CLL, DM2, prior tobacco use.   * I certify that at the point of admission it is my clinical judgment that the patient will require inpatient hospital care spanning beyond 2 midnights from the point of admission due to high intensity of service, high risk for further deterioration and high frequency of surveillance required.*   For questions or updates, please contact [place] Please consult [email or web address] for contact info under   Signed,  Dion Body, MD  01/16/2021 8:34 PM

## 2021-01-16 NOTE — ED Triage Notes (Signed)
Pt arrives via ems FOR C/O CP. STEMI called in field and then canceled. Pt states he was working in the kitchen when he started having chest burning accompanied by diaphoresis, general weakness, nausea, SOB, and dizziness. States he took tums PTA. Pt states all symtpms have resolved.

## 2021-01-16 NOTE — ED Notes (Signed)
Lab called, lactic acid 2.7.

## 2021-01-16 NOTE — ED Provider Notes (Signed)
[facility] EMERGENCY DEPARTMENT Provider Note   CSN: [id number] Arrival date & time: 01/16/21  1354     History Chief Complaint  Patient presents with   Chest Pain    Douglas [name] is a 70 y.o. male.   Chest Pain Associated symptoms: diaphoresis, nausea and weakness   Associated symptoms: no abdominal pain, no back pain, no cough, no fever, no palpitations, no shortness of breath and no vomiting    70 year old male with past medical history significant for CLL, hypertension, presenting to the emergency department with chest pain.  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  He endorsed generalized weakness with associated shortness of breath and lightheadedness.  He states that he took some Tums which relieved the burning sensation.  He no longer has any chest discomfort.    In route with EMS, a code STEMI was called by another ED provider due to concern for ST segment elevations in the precordial leads.  Patient was found to have a new left bundle branch block.  Cardiology evaluated the EKG and canceled the code STEMI.  The patient arrived GCS 15, ABC intact, tachycardic pulse 115, hypertensive BP 154/93, afebrile, hemodynamically stable, not tachypneic, saturating well on room air.  The patient states that since his arrival to the emergency department his symptoms have resolved.  Past Medical History:  Diagnosis Date   BPH (benign prostatic hyperplasia)    CLL (chronic lymphocytic leukemia) ([place])    Hypertension     Patient Active Problem List   Diagnosis Date Noted   CLL (chronic lymphocytic leukemia) ([place]) 01/16/2021    No past surgical history on file.     No family history on file.  Social History   Tobacco Use   Smoking status: Former   Smokeless tobacco: Never    Home Medications Prior to Admission medications   Medication Sig Start  Date End Date Taking? Authorizing Provider  albuterol (PROVENTIL HFA;VENTOLIN HFA) 108 (90 BASE) MCG/ACT inhaler Inhale 2 puffs into the lungs 4 (four) times daily. Patient taking differently: Inhale 2 puffs into the lungs every 6 (six) hours as needed for shortness of breath or wheezing. 07/18/13  Yes Harden Mo, MD  amLODipine (NORVASC) 5 MG tablet Take 5 mg by mouth daily.   Yes [provider]  azelastine (ASTELIN) 0.1 % nasal spray Place 1 spray into both nostrils as needed for rhinitis. Use in each nostril as directed   Yes [provider]  IMBRUVICA 420 MG TABS TAKE 1 TABLET BY MOUTH ONCE DAILY WITH A FULL GLASS OF  WATER 12/22/20  Yes Shadad, Mathis Dad, MD  loratadine (CLARITIN) 10 MG tablet Take 10 mg by mouth daily as needed.    Yes [provider]  montelukast (SINGULAIR) 10 MG tablet Take 10 mg by mouth as needed.   Yes [provider]  terazosin (HYTRIN) 10 MG capsule Take 10 mg by mouth at bedtime.   Yes [provider]  valsartan-hydrochlorothiazide (DIOVAN-HCT) 320-25 MG tablet Take 1 tablet by mouth daily.   Yes [provider]    Allergies    Patient has no known allergies.  Review of Systems   Review of Systems  Constitutional:  Positive for diaphoresis. Negative for chills and fever.  HENT:  Negative for ear pain and sore throat.   Eyes:  Negative for pain and visual disturbance.  Respiratory:  Positive  for chest tightness. Negative for cough and shortness of breath.   Cardiovascular:  Positive for chest pain. Negative for palpitations.  Gastrointestinal:  Positive for nausea. Negative for abdominal pain and vomiting.  Genitourinary:  Negative for dysuria and hematuria.  Musculoskeletal:  Negative for arthralgias and back pain.  Skin:  Negative for color change and rash.  Neurological:  Positive for weakness. Negative for seizures and syncope.  All other systems reviewed and are negative.  Physical Exam Updated  Vital Signs BP (!) 161/85   Pulse 91   Temp 97.7 F (36.5 C) (Oral)   Resp 16   Ht 6' (1.829 m)   Wt 98.9 kg   SpO2 97%   BMI 29.57 kg/m   Physical Exam Vitals and nursing note reviewed.  Constitutional:      General: He is not in acute distress.    Appearance: He is well-developed.  HENT:     Head: Normocephalic and atraumatic.  Eyes:     Conjunctiva/sclera: Conjunctivae normal.     Pupils: Pupils are equal, round, and reactive to light.  Cardiovascular:     Rate and Rhythm: Normal rate and regular rhythm.     Pulses: Normal pulses.     Heart sounds: Normal heart sounds. No murmur heard.    Comments: 2+ radial pulses, symmetric Pulmonary:     Effort: Pulmonary effort is normal. No respiratory distress.     Breath sounds: Normal breath sounds.  Abdominal:     General: There is no distension.     Palpations: Abdomen is soft.     Tenderness: There is no abdominal tenderness. There is no guarding.  Musculoskeletal:        General: No deformity or signs of injury.     Cervical back: Normal range of motion and neck supple.  Skin:    General: Skin is warm and dry.     Findings: No lesion or rash.  Neurological:     General: No focal deficit present.     Mental Status: He is alert. Mental status is at baseline.    ED Results / Procedures / Treatments   Labs (all labs ordered are listed, but only abnormal results are displayed) Labs Reviewed  CBC WITH DIFFERENTIAL/PLATELET - Abnormal; Notable for the following components:      Result Value   WBC 11.8 (*)    RDW 16.9 (*)    Neutro Abs 9.4 (*)    Abs Immature Granulocytes 0.13 (*)    All other components within normal limits  CBG MONITORING, ED - Abnormal; Notable for the following components:   Glucose-Capillary 198 (*)    All other components within normal limits  BASIC METABOLIC PANEL  LIPASE, BLOOD  BRAIN NATRIURETIC PEPTIDE  TROPONIN I (HIGH SENSITIVITY)    EKG EKG Interpretation  Date/Time:  Saturday  January 16 2021 14:01:55 EDT Ventricular Rate:  116 PR Interval:  99 QRS Duration: 180 QT Interval:  373 QTC Calculation: 519 R Axis:   246 Text Interpretation: Sinus tachycardia Ventricular premature complex IVCD, consider atypical RBBB New left bundle branch block Confirmed by Regan Lemming ([id number]) on 01/16/2021 2:49:05 PM  Radiology DG Chest Portable 1 View  Result Date: 01/16/2021 CLINICAL DATA:  Chest pain EXAM: PORTABLE CHEST 1 VIEW COMPARISON:  02/27/2014 FINDINGS: No focal consolidation. No pleural effusion or pneumothorax. Heart and mediastinal contours are unremarkable. No acute osseous abnormality. Severe osteoarthritis of bilateral glenohumeral joints. IMPRESSION: No active disease. Electronically Signed   By: Kathreen Devoid  M.D.   On: 01/16/2021 14:36    Procedures Procedures   Medications Ordered in ED Medications  sodium chloride 0.9 % bolus 1,000 mL (1,000 mLs Intravenous New Bag/Given 01/16/21 1418)    And  0.9 %  sodium chloride infusion (has no administration in time range)  alum & mag hydroxide-simeth (MAALOX/MYLANTA) 200-200-20 MG/5ML suspension 30 mL (30 mLs Oral Given 01/16/21 1418)    And  lidocaine (XYLOCAINE) 2 % viscous mouth solution 15 mL (15 mLs Oral Given 01/16/21 1418)    ED Course  I have reviewed the triage vital signs and the nursing notes.  Pertinent labs & imaging results that were available during my care of the patient were reviewed by me and considered in my medical decision making (see chart for details).    MDM Rules/Calculators/A&P                           70 year old male with past medical history significant for CLL, hypertension, presenting to the emergency department with chest pain.  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  He endorsed generalized weakness with associated shortness of breath and lightheadedness.  He states that he  took some Tums which relieved the burning sensation.  He no longer has any chest discomfort.    In route with EMS, a code STEMI was called by another ED provider due to concern for ST segment elevations in the precordial leads.  Patient was found to have a new left bundle branch block.  Cardiology evaluated the EKG and canceled the code STEMI.  The patient arrived GCS 15, ABC intact, tachycardic pulse 115, hypertensive BP 154/93, afebrile, hemodynamically stable, not tachypneic, saturating well on room air, not diaphoretic  The patient states that since his arrival to the emergency department his symptoms have resolved.  Initial troponin elevated. Plan to follow-up repeat troponin and if climbing, re-engage cardiology for ACS workup. Signout given to Dr. Francia Greaves at [phone]. Final Clinical Impression(s) / ED Diagnoses Final diagnoses:  None    Rx / DC Orders ED Discharge Orders     None        Regan Lemming, MD 01/16/21 2027

## 2021-01-17 ENCOUNTER — Inpatient Hospital Stay (HOSPITAL_COMMUNITY): Payer: 59

## 2021-01-17 DIAGNOSIS — I214 Non-ST elevation (NSTEMI) myocardial infarction: Principal | ICD-10-CM

## 2021-01-17 DIAGNOSIS — I10 Essential (primary) hypertension: Secondary | ICD-10-CM

## 2021-01-17 DIAGNOSIS — C9110 Chronic lymphocytic leukemia of B-cell type not having achieved remission: Secondary | ICD-10-CM

## 2021-01-17 DIAGNOSIS — E119 Type 2 diabetes mellitus without complications: Secondary | ICD-10-CM

## 2021-01-17 LAB — CBC
HCT: 42 % (ref 39.0–52.0)
Hemoglobin: 13.9 g/dL (ref 13.0–17.0)
MCH: 30.4 pg (ref 26.0–34.0)
MCHC: 33.1 g/dL (ref 30.0–36.0)
MCV: 91.9 fL (ref 80.0–100.0)
Platelets: 289 10*3/uL (ref 150–400)
RBC: 4.57 MIL/uL (ref 4.22–5.81)
RDW: 17.2 % — ABNORMAL HIGH (ref 11.5–15.5)
WBC: 9.9 10*3/uL (ref 4.0–10.5)
nRBC: 0 % (ref 0.0–0.2)

## 2021-01-17 LAB — BASIC METABOLIC PANEL
Anion gap: 9 (ref 5–15)
BUN: 20 mg/dL (ref 8–23)
CO2: 26 mmol/L (ref 22–32)
Calcium: 8.4 mg/dL — ABNORMAL LOW (ref 8.9–10.3)
Chloride: 102 mmol/L (ref 98–111)
Creatinine, Ser: 1.05 mg/dL (ref 0.61–1.24)
GFR, Estimated: >60 mL/min (ref >60)
Glucose, Bld: 126 mg/dL — ABNORMAL HIGH (ref 70–99)
Potassium: 3.9 mmol/L (ref 3.5–5.1)
Sodium: 137 mmol/L (ref 135–145)

## 2021-01-17 LAB — ECHOCARDIOGRAM COMPLETE
Height: 72 in
S' Lateral: 3.2 cm
Single Plane A2C EF: 40.1 %
Weight: 3604.96 oz

## 2021-01-17 LAB — HEPARIN LEVEL (UNFRACTIONATED)
Heparin Unfractionated: <0.1 IU/mL — ABNORMAL LOW (ref 0.30–0.70)
Heparin Unfractionated: 0.12 IU/mL — ABNORMAL LOW (ref 0.30–0.70)
Heparin Unfractionated: 0.23 IU/mL — ABNORMAL LOW (ref 0.30–0.70)

## 2021-01-17 LAB — LACTIC ACID, PLASMA: Lactic Acid, Venous: 1.5 mmol/L (ref 0.5–1.9)

## 2021-01-17 MED ORDER — ASPIRIN 81 MG PO CHEW
81.0000 mg | CHEWABLE_TABLET | ORAL | Status: AC
  Administered 2021-01-18: 81 mg via ORAL
  Filled 2021-01-17: qty 1

## 2021-01-17 MED ORDER — SODIUM CHLORIDE 0.9% FLUSH: 3.0000 mL | INTRAVENOUS | Status: DC | PRN

## 2021-01-17 MED ORDER — HEPARIN BOLUS VIA INFUSION
3000.0000 [IU] | Freq: Once | INTRAVENOUS | Status: AC
  Administered 2021-01-17: 3000 [IU] via INTRAVENOUS
  Filled 2021-01-17: qty 3000

## 2021-01-17 MED ORDER — SODIUM CHLORIDE 0.9 % WEIGHT BASED INFUSION
1.0000 mL/kg/h | INTRAVENOUS | Status: DC
  Administered 2021-01-18: 1 mL/kg/h via INTRAVENOUS

## 2021-01-17 MED ORDER — SODIUM CHLORIDE 0.9 % WEIGHT BASED INFUSION: 3.0000 mL/kg/h | INTRAVENOUS | Status: DC

## 2021-01-17 MED ORDER — HEPARIN BOLUS VIA INFUSION
2000.0000 [IU] | Freq: Once | INTRAVENOUS | Status: AC
  Administered 2021-01-17: 2000 [IU] via INTRAVENOUS
  Filled 2021-01-17: qty 2000

## 2021-01-17 MED ORDER — SODIUM CHLORIDE 0.9 % IV SOLN: 250.0000 mL | INTRAVENOUS | Status: DC | PRN

## 2021-01-17 MED ORDER — SODIUM CHLORIDE 0.9% FLUSH
3.0000 mL | Freq: Two times a day (BID) | INTRAVENOUS | Status: DC
  Administered 2021-01-17 – 2021-01-18 (×2): 3 mL via INTRAVENOUS

## 2021-01-17 NOTE — Plan of Care (Signed)

## 2021-01-17 NOTE — Progress Notes (Signed)
[facility] for Heparin Indication: chest pain/ACS  No Known Allergies  Patient Measurements: Height: 6' (182.9 cm) Weight: 102.2 kg (225 lb 5 oz) IBW/kg (Calculated) : 77.6 Heparin Dosing Weight: 97.6 kg  Vital Signs:    Labs: Recent Labs    01/16/21 1410 01/16/21 1608 01/16/21 2102 01/17/21 0316 01/17/21 0650 01/17/21 1239 01/17/21 2114  HGB 15.5  --   --   --  13.9  --   --   HCT 46.7  --   --   --  42.0  --   --   PLT 315  --   --   --  289  --   --   APTT  --   --  52*  --   --   --   --   LABPROT  --   --  13.4  --   --   --   --   INR  --   --  1.0  --   --   --   --   HEPARINUNFRC  --   --   --  <0.10*  --  0.12* 0.23*  CREATININE 1.03  --   --   --  1.05  --   --   TROPONINIHS 72* 296*  --   --   --   --   --      Estimated Creatinine Clearance: 78.6 mL/min (by C-G formula based on SCr of 1.05 mg/dL).   Medical History: Past Medical History:  Diagnosis Date   BPH (benign prostatic hyperplasia)    CLL (chronic lymphocytic leukemia) (H[facility])    Hypertension     Medications:  Medications Prior to Admission  Medication Sig Dispense Refill Last Dose   albuterol (PROVENTIL HFA;VENTOLIN HFA) 108 (90 BASE) MCG/ACT inhaler Inhale 2 puffs into the lungs 4 (four) times daily. (Patient taking differently: Inhale 2 puffs into the lungs every 6 (six) hours as needed for shortness of breath or wheezing.) 1 Inhaler 0 01/16/2021   amLODipine (NORVASC) 5 MG tablet Take 5 mg by mouth daily.   01/16/2021   azelastine (ASTELIN) 0.1 % nasal spray Place 1 spray into both nostrils as needed for rhinitis. Use in each nostril as directed   More than a month ago   IMBRUVICA 420 MG TABS TAKE 1 TABLET BY MOUTH ONCE DAILY WITH A FULL GLASS OF  WATER 28 tablet 1 01/15/2021   loratadine (CLARITIN) 10 MG tablet Take 10 mg by mouth daily as needed.    More than a month ago   montelukast (SINGULAIR) 10 MG tablet Take 10 mg by mouth as needed.   More than a  month ago   terazosin (HYTRIN) 10 MG capsule Take 10 mg by mouth at bedtime.   01/16/2021   valsartan-hydrochlorothiazide (DIOVAN-HCT) 320-25 MG tablet Take 1 tablet by mouth daily.   01/16/2021    Scheduled:   amLODipine  5 mg Oral Daily   [START ON 01/18/2021] aspirin  81 mg Oral Pre-Cath   aspirin EC  81 mg Oral Daily   atorvastatin  80 mg Oral QHS   nitroGLYCERIN  0.5 inch Topical Once   sodium chloride flush  3 mL Intravenous Q12H   terazosin  10 mg Oral Daily   Infusions:   sodium chloride 125 mL/hr at 01/17/21 0300   sodium chloride     [START ON 01/18/2021] sodium chloride     Followed by   Derrill Memo ON 01/18/2021] sodium  chloride     heparin 1,700 Units/hr (01/17/21 1417)   PRN:   Assessment: 62 yom with a history of HTN, CLL and BPH . Patient is presenting with chest pain. Heparin per pharmacy consult placed for chest pain/ACS.  Patient is on not on anticoagulation prior to arrival.  Hgb15.5;plt 315  10/9 PM update:  Heparin level low but trending up Likely cath tomorrow   Goal of Therapy:  Heparin level 0.3-0.7 units/ml Monitor platelets by anticoagulation protocol: Yes   Plan:  Heparin 2000 units re-bolus Inc heparin to 1850 units/hr Heparin level with AM labs  Narda Bonds, PharmD, [place] Pharmacist Phone: [phone]

## 2021-01-17 NOTE — Progress Notes (Signed)
Patient's HR dropping to 37 nonsustained. Patient asymptomatic. EKG done at bedside and placed in file. MD paged and made aware of the HR and the EKG. MD called back. No new orders received. Will continue to monitor the HR closely.

## 2021-01-17 NOTE — Progress Notes (Signed)
  Echocardiogram 2D Echocardiogram has been performed.  Johny Chess 01/17/2021, 2:47 PM

## 2021-01-17 NOTE — Progress Notes (Signed)
[facility] for Heparin Indication: chest pain/ACS  No Known Allergies  Patient Measurements: Height: 6' (182.9 cm) Weight: 102.2 kg (225 lb 5 oz) IBW/kg (Calculated) : 77.6 Heparin Dosing Weight: 97.6 kg  Vital Signs: Temp: 98.1 F (36.7 C) (10/09 0725) Temp Source: Oral (10/09 0725) BP: 145/76 (10/09 0700) Pulse Rate: 73 (10/09 0725)  Labs: Recent Labs    01/16/21 1410 01/16/21 1608 01/16/21 2102 01/17/21 0316 01/17/21 0650 01/17/21 1239  HGB 15.5  --   --   --  13.9  --   HCT 46.7  --   --   --  42.0  --   PLT 315  --   --   --  289  --   APTT  --   --  52*  --   --   --   LABPROT  --   --  13.4  --   --   --   INR  --   --  1.0  --   --   --   HEPARINUNFRC  --   --   --  <0.10*  --  0.12*  CREATININE 1.03  --   --   --  1.05  --   TROPONINIHS 72* 296*  --   --   --   --      Estimated Creatinine Clearance: 78.6 mL/min (by C-G formula based on SCr of 1.05 mg/dL).   Assessment: 37 yom with a history of HTN, CLL and BPH . Patient is presenting with chest pain. Heparin per pharmacy consult placed for chest pain/ACS.  Patient is on not on anticoagulation prior to arrival.  Hgb15.5;plt 315  Heparin level 0.12 this PM   Goal of Therapy:  Heparin level 0.3-0.7 units/ml Monitor platelets by anticoagulation protocol: Yes   Plan:  Increase heparin to 1700 units / hr 6 hour heparin level Continue to monitor H&H and platelets  Cath planned for tomorrow  Thank you Anette Guarneri, PharmD

## 2021-01-17 NOTE — Progress Notes (Signed)
Progress Note  Patient Name: Douglas [name] Date of Encounter: 01/17/2021  [facility] Cardiologist: None New (Douglas [name])  Subjective   No further chest pain since admission, even when he walked to the bathroom. Multiple episodes of second-degree AV block Mobitz type I overnight but also this morning even when awake.  Only moderate bradycardia down to the mid 30s, brief. Has not had complete syncope, but did have presyncope yesterday.  Inpatient Medications    Scheduled Meds:  amLODipine  5 mg Oral Daily   aspirin EC  81 mg Oral Daily   atorvastatin  80 mg Oral QHS   hydrochlorothiazide  25 mg Oral Daily   irbesartan  300 mg Oral Daily   metoprolol tartrate  25 mg Oral BID   nitroGLYCERIN  0.5 inch Topical Once   terazosin  10 mg Oral Daily   Continuous Infusions:  sodium chloride 125 mL/hr at 01/17/21 0300   heparin 1,400 Units/hr (01/17/21 0415)   PRN Meds: acetaminophen, albuterol, nitroGLYCERIN, ondansetron (ZOFRAN) IV   Vital Signs    Vitals:   01/17/21 0500 01/17/21 0621 01/17/21 0700 01/17/21 0725  BP: 121/72  (!) 145/76   Pulse: (!) 46 (!) 34 (!) 54 73  Resp:      Temp:    98.1 F (36.7 C)  TempSrc:    Oral  SpO2: 94%  95% 95%  Weight:      Height:        Intake/Output Summary (Last 24 hours) at 01/17/2021 0911 Last data filed at 01/17/2021 0727 Gross per 24 hour  Intake 2405.86 ml  Output 475 ml  Net 1930.86 ml   Last 3 Weights 01/16/2021 01/16/2021 10/08/2020  Weight (lbs) 225 lb 5 oz 218 lb 227 lb 6.4 oz  Weight (kg) 102.2 kg 98.884 kg 103.148 kg      Telemetry    Sinus rhythm with frequent episodes of second-degree AV block Mobitz type I- Personally Reviewed  ECG    Sinus rhythm, PVCs, left bundle branch block- Personally Reviewed  Physical Exam  Appears comfortable GEN: No acute distress.   Neck: No JVD Cardiac: RRR with occasional brief pauses, no murmurs, rubs, or gallops.  Respiratory: Clear to auscultation bilaterally. GI:  Soft, nontender, non-distended  MS: No edema; No deformity. Neuro:  Nonfocal  Psych: Normal affect   Labs    High Sensitivity Troponin:   Recent Labs  Lab 01/16/21 1410 01/16/21 1608  TROPONINIHS 72* 296*     Chemistry Recent Labs  Lab 01/16/21 1410 01/17/21 0650  NA 137 137  K 4.0 3.9  CL 98 102  CO2 27 26  GLUCOSE 202* 126*  BUN 24* 20  CREATININE 1.03 1.05  CALCIUM 9.0 8.4*  GFRNONAA >60 >60  ANIONGAP 12 9    Lipids  Recent Labs  Lab 01/16/21 1810  CHOL 168  TRIG 63  HDL 44  LDLCALC 111*  CHOLHDL 3.8    Hematology Recent Labs  Lab 01/16/21 1410 01/17/21 0650  WBC 11.8* 9.9  RBC 5.04 4.57  HGB 15.5 13.9  HCT 46.7 42.0  MCV 92.7 91.9  MCH 30.8 30.4  MCHC 33.2 33.1  RDW 16.9* 17.2*  PLT 315 289   Thyroid  Recent Labs  Lab 01/16/21 1810  TSH 3.698    BNP Recent Labs  Lab 01/16/21 1410  BNP 59.4    DDimer No results for input(s): DDIMER in the last 168 hours.   Radiology    DG Chest Portable 1 View  Result Date: 01/16/2021 CLINICAL DATA:  Chest pain EXAM: PORTABLE CHEST 1 VIEW COMPARISON:  02/27/2014 FINDINGS: No focal consolidation. No pleural effusion or pneumothorax. Heart and mediastinal contours are unremarkable. No acute osseous abnormality. Severe osteoarthritis of bilateral glenohumeral joints. IMPRESSION: No active disease. Electronically Signed   By: Kathreen Devoid M.D.   On: 01/16/2021 14:36    Cardiac Studies   Echocardiogram pending  Patient Profile     70 y.o. male with CLL, type 2 diabetes mellitus (diet-controlled), hypertension presenting with chest discomfort and presyncope and found to have frequent episodes of second-degree AV block Mobitz type I and newly recognized LBBB.  Assessment & Plan    NSTEMI: Currently asymptomatic.  Mild elevation in cardiac enzymes.  Nondiagnostic ECG.  Echo pending.  Multiple coronary risk factors include diabetes, hypertension, family history of CAD and remote smoking history.   Recommend cardiac catheterization and if necessary PCI-stent tomorrow. This procedure has been fully reviewed with the patient and written informed consent has been obtained.  Started on high-dose statin, aspirin.  Hold irbesartan and hydrochlorothiazide tomorrow morning in anticipation of cath. 2nd deg AVB MT1 and LBBB: These may simply be expression of age-related conduction system disease, but with his current presentation need to consider the possibility that second-degree AV block is due to AV node ischemia due to right coronary artery disease.  Hold beta-blockers.  There is also a less likely possibility that his symptoms were due to high-grade AV block with symptomatic bradycardia.  If no meaningful CAD is identified, may need to consider pacemaker implantation. DM: Controlled with diet only A1c 6.7%. CLL: Reevaluate treatment with ibrutinib if he needs long-term antiplatelet agents.  Note that currently he does not have either anemia or thrombocytopenia and a WBC count is actually quite normal at 9900.     For questions or updates, please contact [place] Please consult [email or web address] for contact info under        Signed, Sanda Klein, MD  01/17/2021, 9:11 AM

## 2021-01-17 NOTE — Progress Notes (Signed)
[place] for Heparin Indication: chest pain/ACS  No Known Allergies  Patient Measurements: Height: 6' (182.9 cm) Weight: 102.2 kg (225 lb 5 oz) IBW/kg (Calculated) : 77.6 Heparin Dosing Weight: 97.6 kg  Vital Signs: Temp: 98.2 F (36.8 C) (10/09 0300) Temp Source: Oral (10/09 0300) BP: 121/73 (10/09 0300) Pulse Rate: 47 (10/09 0300)  Labs: Recent Labs    01/16/21 1410 01/16/21 1608 01/16/21 2102 01/17/21 0316  HGB 15.5  --   --   --   HCT 46.7  --   --   --   PLT 315  --   --   --   APTT  --   --  52*  --   LABPROT  --   --  13.4  --   INR  --   --  1.0  --   HEPARINUNFRC  --   --   --  <0.10*  CREATININE 1.03  --   --   --   TROPONINIHS 72* 296*  --   --      Estimated Creatinine Clearance: 80.1 mL/min (by C-G formula based on SCr of 1.03 mg/dL).   Medical History: Past Medical History:  Diagnosis Date   BPH (benign prostatic hyperplasia)    CLL (chronic lymphocytic leukemia) (HCC)    Hypertension     Medications:  Medications Prior to Admission  Medication Sig Dispense Refill Last Dose   albuterol (PROVENTIL HFA;VENTOLIN HFA) 108 (90 BASE) MCG/ACT inhaler Inhale 2 puffs into the lungs 4 (four) times daily. (Patient taking differently: Inhale 2 puffs into the lungs every 6 (six) hours as needed for shortness of breath or wheezing.) 1 Inhaler 0 01/16/2021   amLODipine (NORVASC) 5 MG tablet Take 5 mg by mouth daily.   01/16/2021   azelastine (ASTELIN) 0.1 % nasal spray Place 1 spray into both nostrils as needed for rhinitis. Use in each nostril as directed   More than a month ago   IMBRUVICA 420 MG TABS TAKE 1 TABLET BY MOUTH ONCE DAILY WITH A FULL GLASS OF  WATER 28 tablet 1 01/15/2021   loratadine (CLARITIN) 10 MG tablet Take 10 mg by mouth daily as needed.    More than a month ago   montelukast (SINGULAIR) 10 MG tablet Take 10 mg by mouth as needed.   More than a month ago   terazosin (HYTRIN) 10 MG capsule Take 10 mg by  mouth at bedtime.   01/16/2021   valsartan-hydrochlorothiazide (DIOVAN-HCT) 320-25 MG tablet Take 1 tablet by mouth daily.   01/16/2021    Scheduled:   amLODipine  5 mg Oral Daily   aspirin EC  81 mg Oral Daily   atorvastatin  80 mg Oral QHS   heparin  3,000 Units Intravenous Once   hydrochlorothiazide  25 mg Oral Daily   irbesartan  300 mg Oral Daily   metoprolol tartrate  25 mg Oral BID   nitroGLYCERIN  0.5 inch Topical Once   terazosin  10 mg Oral Daily   Infusions:   sodium chloride 125 mL/hr at 01/17/21 0300   heparin 1,200 Units/hr (01/17/21 0300)   PRN:   Assessment: 35 yom with a history of HTN, CLL and BPH . Patient is presenting with chest pain. Heparin per pharmacy consult placed for chest pain/ACS.  Patient is on not on anticoagulation prior to arrival.  Hgb15.5;plt 315  10/9 AM update:  Heparin level low Trop 72>>296  Goal of Therapy:  Heparin level 0.3-0.7  units/ml Monitor platelets by anticoagulation protocol: Yes   Plan:  Heparin 3000 units re-bolus Inc heparin to 1400 units/hr Check anti-Xa level in 8 hours and daily while on heparin Continue to monitor H&H and platelets  Narda Bonds, PharmD, BCPS Clinical Pharmacist Phone: [phone]

## 2021-01-17 NOTE — Progress Notes (Signed)
   01/17/21 0300  Assess: MEWS Score  Temp 98.2 F (36.8 C)  BP 121/73  Pulse Rate (!) 47  ECG Heart Rate (!) 50  Resp 13  Level of Consciousness Alert  SpO2 93 %  O2 Device Room Air  Assess: MEWS Score  MEWS Temp 0  MEWS Systolic 0  MEWS Pulse 1  MEWS RR 1  MEWS LOC 0  MEWS Score 2  MEWS Score Color Yellow  Assess: if the MEWS score is Yellow or Red  Were vital signs taken at a resting state? Yes  Focused Assessment No change from prior assessment  Early Detection of Sepsis Score *See Row Information* Low  MEWS guidelines implemented *See Row Information* Yes  Treat  Pain Scale 0-10  Pain Score 0  Take Vital Signs  Increase Vital Sign Frequency  Yellow: Q 2hr X 2 then Q 4hr X 2, if remains yellow, continue Q 4hrs  Escalate  MEWS: Escalate Yellow: discuss with charge nurse/RN and consider discussing with provider and RRT  Notify: Charge Nurse/RN  Name of Charge Nurse/RN Notified jessica s. RN  Date Charge Nurse/RN Notified 01/17/21  Time Charge Nurse/RN Notified 0300  Document  Patient Outcome Other (Comment) (stable)  Progress note created (see row info) Yes  MD aware of the HR

## 2021-01-18 ENCOUNTER — Encounter (HOSPITAL_COMMUNITY)
Admission: EM | Disposition: A | Discharge status: Home / Self Care | Payer: Self-pay | Source: Home / Self Care | Attending: Student in an Organized Health Care Education/Training Program

## 2021-01-18 ENCOUNTER — Encounter (HOSPITAL_COMMUNITY): Payer: Self-pay | Admitting: Cardiovascular Disease

## 2021-01-18 DIAGNOSIS — R079 Chest pain, unspecified: Secondary | ICD-10-CM

## 2021-01-18 DIAGNOSIS — I447 Left bundle-branch block, unspecified: Secondary | ICD-10-CM

## 2021-01-18 DIAGNOSIS — I214 Non-ST elevation (NSTEMI) myocardial infarction: Secondary | ICD-10-CM | POA: Diagnosis not present

## 2021-01-18 DIAGNOSIS — R778 Other specified abnormalities of plasma proteins: Secondary | ICD-10-CM | POA: Diagnosis not present

## 2021-01-18 DIAGNOSIS — I441 Atrioventricular block, second degree: Secondary | ICD-10-CM | POA: Diagnosis not present

## 2021-01-18 DIAGNOSIS — R55 Syncope and collapse: Secondary | ICD-10-CM

## 2021-01-18 DIAGNOSIS — C9110 Chronic lymphocytic leukemia of B-cell type not having achieved remission: Secondary | ICD-10-CM | POA: Diagnosis not present

## 2021-01-18 DIAGNOSIS — I440 Atrioventricular block, first degree: Secondary | ICD-10-CM

## 2021-01-18 HISTORY — PX: LEFT HEART CATH AND CORONARY ANGIOGRAPHY: CATH118249

## 2021-01-18 LAB — CBC
HCT: 42.3 % (ref 39.0–52.0)
Hemoglobin: 14.3 g/dL (ref 13.0–17.0)
MCH: 30.6 pg (ref 26.0–34.0)
MCHC: 33.8 g/dL (ref 30.0–36.0)
MCV: 90.4 fL (ref 80.0–100.0)
Platelets: 313 10*3/uL (ref 150–400)
RBC: 4.68 MIL/uL (ref 4.22–5.81)
RDW: 17 % — ABNORMAL HIGH (ref 11.5–15.5)
WBC: 8.5 10*3/uL (ref 4.0–10.5)
nRBC: 0 % (ref 0.0–0.2)

## 2021-01-18 LAB — SARS CORONAVIRUS 2 BY RT PCR (HOSPITAL ORDER, PERFORMED IN ~~LOC~~ HOSPITAL LAB): SARS Coronavirus 2: NEGATIVE

## 2021-01-18 LAB — HEPARIN LEVEL (UNFRACTIONATED): Heparin Unfractionated: 0.45 IU/mL (ref 0.30–0.70)

## 2021-01-18 SURGERY — LEFT HEART CATH AND CORONARY ANGIOGRAPHY
Anesthesia: LOCAL

## 2021-01-18 MED ORDER — LABETALOL HCL 5 MG/ML IV SOLN: 10.0000 mg | INTRAVENOUS | Status: AC | PRN

## 2021-01-18 MED ORDER — FENTANYL CITRATE (PF) 100 MCG/2ML IJ SOLN
INTRAMUSCULAR | Status: DC | PRN
  Administered 2021-01-18: 25 ug via INTRAVENOUS

## 2021-01-18 MED ORDER — SODIUM CHLORIDE 0.9 % IV SOLN: INTRAVENOUS | Status: DC

## 2021-01-18 MED ORDER — VERAPAMIL HCL 2.5 MG/ML IV SOLN
INTRAVENOUS | Status: AC
  Filled 2021-01-18: qty 2

## 2021-01-18 MED ORDER — SODIUM CHLORIDE 0.9% FLUSH: 3.0000 mL | INTRAVENOUS | Status: DC | PRN

## 2021-01-18 MED ORDER — SODIUM CHLORIDE 0.9% FLUSH: 3.0000 mL | Freq: Two times a day (BID) | INTRAVENOUS | Status: DC

## 2021-01-18 MED ORDER — HEPARIN SODIUM (PORCINE) 1000 UNIT/ML IJ SOLN
INTRAMUSCULAR | Status: DC | PRN
  Administered 2021-01-18: 5000 [IU] via INTRAVENOUS

## 2021-01-18 MED ORDER — HEPARIN (PORCINE) IN NACL 1000-0.9 UT/500ML-% IV SOLN
INTRAVENOUS | Status: AC
  Filled 2021-01-18: qty 500

## 2021-01-18 MED ORDER — SODIUM CHLORIDE 0.9% FLUSH
3.0000 mL | Freq: Two times a day (BID) | INTRAVENOUS | Status: DC
Start: 2021-01-18 — End: 2021-01-20
  Administered 2021-01-18 – 2021-01-19 (×3): 3 mL via INTRAVENOUS

## 2021-01-18 MED ORDER — VERAPAMIL HCL 2.5 MG/ML IV SOLN
INTRAVENOUS | Status: DC | PRN
  Administered 2021-01-18: 10 mL via INTRA_ARTERIAL

## 2021-01-18 MED ORDER — HEPARIN SODIUM (PORCINE) 1000 UNIT/ML IJ SOLN
INTRAMUSCULAR | Status: AC
  Filled 2021-01-18: qty 1

## 2021-01-18 MED ORDER — HEPARIN (PORCINE) IN NACL 1000-0.9 UT/500ML-% IV SOLN
INTRAVENOUS | Status: DC | PRN
  Administered 2021-01-18 (×2): 500 mL

## 2021-01-18 MED ORDER — SODIUM CHLORIDE 0.9 % IV SOLN
80.0000 mg | INTRAVENOUS | Status: AC
  Administered 2021-01-19: 80 mg
  Filled 2021-01-18: qty 2

## 2021-01-18 MED ORDER — HYDRALAZINE HCL 20 MG/ML IJ SOLN: 10.0000 mg | INTRAMUSCULAR | Status: AC | PRN

## 2021-01-18 MED ORDER — LIDOCAINE HCL (PF) 1 % IJ SOLN
INTRAMUSCULAR | Status: AC
  Filled 2021-01-18: qty 30

## 2021-01-18 MED ORDER — LIDOCAINE HCL (PF) 1 % IJ SOLN
INTRAMUSCULAR | Status: DC | PRN
  Administered 2021-01-18: 2 mL

## 2021-01-18 MED ORDER — FENTANYL CITRATE (PF) 100 MCG/2ML IJ SOLN
INTRAMUSCULAR | Status: AC
  Filled 2021-01-18: qty 2

## 2021-01-18 MED ORDER — CHLORHEXIDINE GLUCONATE 4 % EX LIQD
60.0000 mL | Freq: Once | CUTANEOUS | Status: AC
  Administered 2021-01-18: 4 via TOPICAL
  Filled 2021-01-18: qty 60

## 2021-01-18 MED ORDER — CHLORHEXIDINE GLUCONATE 4 % EX LIQD: 60.0000 mL | Freq: Once | CUTANEOUS | Status: DC

## 2021-01-18 MED ORDER — IOHEXOL 350 MG/ML SOLN
INTRAVENOUS | Status: DC | PRN
  Administered 2021-01-18: 50 mL

## 2021-01-18 MED ORDER — SODIUM CHLORIDE 0.9 % IV SOLN: 250.0000 mL | INTRAVENOUS | Status: DC

## 2021-01-18 MED ORDER — MIDAZOLAM HCL 2 MG/2ML IJ SOLN
INTRAMUSCULAR | Status: AC
  Filled 2021-01-18: qty 2

## 2021-01-18 MED ORDER — SODIUM CHLORIDE 0.9 % IV SOLN: 250.0000 mL | INTRAVENOUS | Status: DC | PRN

## 2021-01-18 MED ORDER — CEFAZOLIN SODIUM-DEXTROSE 2-4 GM/100ML-% IV SOLN
2.0000 g | INTRAVENOUS | Status: DC
  Filled 2021-01-18: qty 100

## 2021-01-18 MED ORDER — MIDAZOLAM HCL 2 MG/2ML IJ SOLN
INTRAMUSCULAR | Status: DC | PRN
  Administered 2021-01-18: 1 mg via INTRAVENOUS

## 2021-01-18 MED ORDER — SODIUM CHLORIDE 0.9 % IV SOLN: INTRAVENOUS | Status: AC

## 2021-01-18 SURGICAL SUPPLY — 9 items

## 2021-01-18 NOTE — Interval H&P Note (Signed)
History and Physical Interval Note:  01/18/2021 1:42 PM  Douglas [name]  has presented today for surgery, with the diagnosis of NSTEMI.  The various methods of treatment have been discussed with the patient and family. After consideration of risks, benefits and other options for treatment, the patient has consented to  Procedure(s): LEFT HEART CATH AND CORONARY ANGIOGRAPHY (N/A) as a surgical intervention.  The patient's history has been reviewed, patient examined, no change in status, stable for surgery.  I have reviewed the patient's chart and labs.  Questions were answered to the patient's satisfaction.    Cath Lab Visit (complete for each Cath Lab visit)  Clinical Evaluation Leading to the Procedure:   ACS: Yes.    Non-ACS:    Anginal Classification: CCS III  Anti-ischemic medical therapy: Minimal Therapy (1 class of medications)  Non-Invasive Test Results: No non-invasive testing performed  Prior CABG: No previous CABG        Lauree Chandler

## 2021-01-18 NOTE — H&P (View-Only) (Signed)
Progress Note  Patient Name: Douglas [name] Date of Encounter: 01/18/2021  Post Cardiologist: New (Croitoru)  Subjective   No chest pain or dizziness. High grade AVB/CHF overnight noted. Pt asymptomatic  Inpatient Medications    Scheduled Meds:  amLODipine  5 mg Oral Daily   aspirin EC  81 mg Oral Daily   atorvastatin  80 mg Oral QHS   nitroGLYCERIN  0.5 inch Topical Once   sodium chloride flush  3 mL Intravenous Q12H   terazosin  10 mg Oral Daily   Continuous Infusions:  sodium chloride 125 mL/hr at 01/17/21 0300   sodium chloride     sodium chloride 1 mL/kg/hr (01/18/21 0407)   heparin 1,850 Units/hr (01/18/21 0119)   PRN Meds: sodium chloride, acetaminophen, albuterol, nitroGLYCERIN, ondansetron (ZOFRAN) IV, sodium chloride flush   Vital Signs    Vitals:   01/17/21 0700 01/17/21 0725 01/17/21 2216 01/18/21 0410  BP: (!) 145/76  (!) 155/75 (!) 169/83  Pulse: (!) 54 73 71 74  Resp:   18   Temp:  98.1 F (36.7 C) 98.4 F (36.9 C) 97.7 F (36.5 C)  TempSrc:  Oral Oral Oral  SpO2: 95% 95%  95%  Weight:      Height:        Intake/Output Summary (Last 24 hours) at 01/18/2021 0742 Last data filed at 01/17/2021 1207 Gross per 24 hour  Intake --  Output 600 ml  Net -600 ml   Last 3 Weights 01/16/2021 01/16/2021 10/08/2020  Weight (lbs) 225 lb 5 oz 218 lb 227 lb 6.4 oz  Weight (kg) 102.2 kg 98.884 kg 103.148 kg      Telemetry    Sinus, frequent Mobitz 1 AVB-Personally Reviewed  ECG    Sinus, 1st degree AV block, LBBB- Personally Reviewed  Physical Exam    General: Well developed, well nourished, NAD  HEENT: OP clear, mucus membranes moist  SKIN: warm, dry. No rashes. Neuro: No focal deficits  Musculoskeletal: Muscle strength 5/5 all ext  Psychiatric: Mood and affect normal  Neck: No JVD, no carotid bruits, no thyromegaly, no lymphadenopathy.  Lungs:Clear bilaterally, no wheezes, rhonci, crackles Cardiovascular: Regular rate and rhythm.  No murmurs, gallops or rubs. Abdomen:Soft. Bowel sounds present. Non-tender.  Extremities: No lower extremity edema. Pulses are 2 + in the bilateral DP/PT.   Labs    High Sensitivity Troponin:   Recent Labs  Lab 01/16/21 1410 01/16/21 1608  TROPONINIHS 72* 296*     Chemistry Recent Labs  Lab 01/16/21 1410 01/17/21 0650  NA 137 137  K 4.0 3.9  CL 98 102  CO2 27 26  GLUCOSE 202* 126*  BUN 24* 20  CREATININE 1.03 1.05  CALCIUM 9.0 8.4*  GFRNONAA >60 >60  ANIONGAP 12 9    Lipids  Recent Labs  Lab 01/16/21 1810  CHOL 168  TRIG 63  HDL 44  LDLCALC 111*  CHOLHDL 3.8    Hematology Recent Labs  Lab 01/16/21 1410 01/17/21 0650 01/18/21 0600  WBC 11.8* 9.9 8.5  RBC 5.04 4.57 4.68  HGB 15.5 13.9 14.3  HCT 46.7 42.0 42.3  MCV 92.7 91.9 90.4  MCH 30.8 30.4 30.6  MCHC 33.2 33.1 33.8  RDW 16.9* 17.2* 17.0*  PLT 315 289 313   Thyroid  Recent Labs  Lab 01/16/21 1810  TSH 3.698    BNP Recent Labs  Lab 01/16/21 1410  BNP 59.4    DDimer No results for input(s): DDIMER in the last 168 hours.  Radiology    DG Chest Portable 1 View  Result Date: 01/16/2021 CLINICAL DATA:  Chest pain EXAM: PORTABLE CHEST 1 VIEW COMPARISON:  02/27/2014 FINDINGS: No focal consolidation. No pleural effusion or pneumothorax. Heart and mediastinal contours are unremarkable. No acute osseous abnormality. Severe osteoarthritis of bilateral glenohumeral joints. IMPRESSION: No active disease. Electronically Signed   By: Kathreen Devoid M.D.   On: 01/16/2021 14:36   ECHOCARDIOGRAM COMPLETE  Result Date: 01/17/2021    ECHOCARDIOGRAM REPORT   Patient Name:   Douglas [name] Date of Exam: 01/17/2021 Medical Rec #:  [id number]        Height:       72.0 in Accession #:    [id number]       Weight:       225.3 lb Date of Birth:  1950-12-16         BSA:          2.241 m Patient Age:    70 years         BP:           145/76 mmHg Patient Gender: M                HR:           82 bpm. Exam Location:   Inpatient Procedure: 2D Echo Indications:     NSTEMI  History:         Patient has no prior history of Echocardiogram examinations.                  Arrythmias:second degree AV block; Risk Factors:Hypertension                  and Diabetes.  Sonographer:     Johny Chess RDCS Referring Phys:  [id number] MATTHEW A CARLISLE Diagnosing Phys: Sanda Klein MD IMPRESSIONS  1. Left ventricular ejection fraction, by estimation, is 50 to 55%. The left ventricle has low normal function. The left ventricle demonstrates regional wall motion abnormalities (see scoring diagram/findings for description). Indeterminate diastolic filling due to E-A fusion. There is moderate hypokinesis of the left ventricular, basal-mid inferolateral wall.  2. Right ventricular systolic function is normal. The right ventricular size is normal.  3. The mitral valve is normal in structure. No evidence of mitral valve regurgitation.  4. The aortic valve is tricuspid. Aortic valve regurgitation is not visualized. Mild aortic valve sclerosis is present, with no evidence of aortic valve stenosis. FINDINGS  Left Ventricle: Left ventricular ejection fraction, by estimation, is 50 to 55%. The left ventricle has low normal function. The left ventricle demonstrates regional wall motion abnormalities. Moderate hypokinesis of the left ventricular, basal-mid inferolateral wall. The left ventricular internal cavity size was normal in size. There is borderline concentric left ventricular hypertrophy. Abnormal (paradoxical) septal motion, consistent with left bundle branch block. Indeterminate diastolic filling  due to E-A fusion. Right Ventricle: The right ventricular size is normal. No increase in right ventricular wall thickness. Right ventricular systolic function is normal. Left Atrium: Left atrial size was normal in size. Right Atrium: Right atrial size was normal in size. Pericardium: There is no evidence of pericardial effusion. Mitral Valve: The mitral  valve is normal in structure. Mild mitral annular calcification. No evidence of mitral valve regurgitation. Tricuspid Valve: The tricuspid valve is normal in structure. Tricuspid valve regurgitation is not demonstrated. Aortic Valve: The aortic valve is tricuspid. Aortic valve regurgitation is not visualized. Mild aortic valve sclerosis is present, with no evidence of aortic  valve stenosis. Pulmonic Valve: The pulmonic valve was not well visualized. Pulmonic valve regurgitation is not visualized. Aorta: The aortic root and ascending aorta are structurally normal, with no evidence of dilitation. IAS/Shunts: No atrial level shunt detected by color flow Doppler.  LEFT VENTRICLE PLAX 2D LVIDd:         4.60 cm      Diastology LVIDs:         3.20 cm      LV e' medial:  5.55 cm/s LV PW:         1.20 cm      LV e' lateral: 10.10 cm/s LV IVS:        1.30 cm LVOT diam:     2.10 cm LV SV:         56 LV SV Index:   25 LVOT Area:     3.46 cm  LV Volumes (MOD) LV vol d, MOD A2C: 111.0 ml LV vol s, MOD A2C: 66.5 ml LV SV MOD A2C:     44.5 ml RIGHT VENTRICLE             IVC RV S prime:     16.50 cm/s  IVC diam: 1.50 cm TAPSE (M-mode): 2.8 cm LEFT ATRIUM           Index        RIGHT ATRIUM           Index LA diam:      4.00 cm 1.78 cm/m   RA Area:     19.40 cm LA Vol (A2C): 68.7 ml 30.65 ml/m  RA Volume:   57.50 ml  25.66 ml/m  AORTIC VALVE LVOT Vmax:   88.10 cm/s LVOT Vmean:  55.700 cm/s LVOT VTI:    0.162 m  AORTA Ao Root diam: 3.60 cm Ao Asc diam:  3.40 cm  SHUNTS Systemic VTI:  0.16 m Systemic Diam: 2.10 cm Mihai Croitoru MD Electronically signed by Sanda Klein MD Signature Date/Time: 01/17/2021/3:49:20 PM    Final (Updated)     Cardiac Studies   Echocardiogram pending  Patient Profile     70 y.o. male with CLL, type 2 diabetes mellitus (diet-controlled), hypertension presenting with chest discomfort and presyncope and found to have frequent episodes of second-degree AV block Mobitz type I and newly recognized  LBBB.  Assessment & Plan    NSTEMI: No chest pain this am. His troponin is mildly elevated. LVEF preserved with basal inferior wall motion abnormality. New LBBB on EKG. Multiple coronary risk factors include diabetes, hypertension, family history of CAD and remote smoking history.  Cardiac cath is indicated and is planned for later today. He has no questions regarding his cath today. Continue high dose statin and ASA. His irbesartan and hydrochlorothiazide are on hold pending the cath.   2nd deg AV block (Mobitz 1)/LBBB: These may simply be expression of age-related conduction system disease, but with his current presentation need to consider the possibility that second-degree AV block is due to AV node ischemia due to right coronary artery disease.  No beta blockers. If there is no obstructive CAD will need EP consult for pacemaker.   DM: Controlled with diet only A1c 6.7%.  CLL: Reevaluate treatment with ibrutinib if he needs long-term antiplatelet agents.  Note that currently he does not have either anemia or thrombocytopenia and a WBC count is 8500.      For questions or updates, please contact [place] Please consult [email or web address] for contact info under  Signed, Lauree Chandler, MD  01/18/2021, 7:42 AM

## 2021-01-18 NOTE — Progress Notes (Addendum)
Progress Note  Patient Name: Douglas [name] Date of Encounter: 01/18/2021  [place] Cardiologist: New (Croitoru)  Subjective   No chest pain or dizziness. High grade AVB/CHF overnight noted. Pt asymptomatic  Inpatient Medications    Scheduled Meds:  amLODipine  5 mg Oral Daily   aspirin EC  81 mg Oral Daily   atorvastatin  80 mg Oral QHS   nitroGLYCERIN  0.5 inch Topical Once   sodium chloride flush  3 mL Intravenous Q12H   terazosin  10 mg Oral Daily   Continuous Infusions:  sodium chloride 125 mL/hr at 01/17/21 0300   sodium chloride     sodium chloride 1 mL/kg/hr (01/18/21 0407)   heparin 1,850 Units/hr (01/18/21 0119)   PRN Meds: sodium chloride, acetaminophen, albuterol, nitroGLYCERIN, ondansetron (ZOFRAN) IV, sodium chloride flush   Vital Signs    Vitals:   01/17/21 0700 01/17/21 0725 01/17/21 2216 01/18/21 0410  BP: (!) 145/76  (!) 155/75 (!) 169/83  Pulse: (!) 54 73 71 74  Resp:   18   Temp:  98.1 F (36.7 C) 98.4 F (36.9 C) 97.7 F (36.5 C)  TempSrc:  Oral Oral Oral  SpO2: 95% 95%  95%  Weight:      Height:        Intake/Output Summary (Last 24 hours) at 01/18/2021 0742 Last data filed at 01/17/2021 1207 Gross per 24 hour  Intake --  Output 600 ml  Net -600 ml   Last 3 Weights 01/16/2021 01/16/2021 10/08/2020  Weight (lbs) 225 lb 5 oz 218 lb 227 lb 6.4 oz  Weight (kg) 102.2 kg 98.884 kg 103.148 kg      Telemetry    Sinus, frequent Mobitz 1 AVB-Personally Reviewed  ECG    Sinus, 1st degree AV block, LBBB- Personally Reviewed  Physical Exam    General: Well developed, well nourished, NAD  HEENT: OP clear, mucus membranes moist  SKIN: warm, dry. No rashes. Neuro: No focal deficits  Musculoskeletal: Muscle strength 5/5 all ext  Psychiatric: Mood and affect normal  Neck: No JVD, no carotid bruits, no thyromegaly, no lymphadenopathy.  Lungs:Clear bilaterally, no wheezes, rhonci, crackles Cardiovascular: Regular rate and rhythm.  No murmurs, gallops or rubs. Abdomen:Soft. Bowel sounds present. Non-tender.  Extremities: No lower extremity edema. Pulses are 2 + in the bilateral DP/PT.   Labs    High Sensitivity Troponin:   Recent Labs  Lab 01/16/21 1410 01/16/21 1608  TROPONINIHS 72* 296*     Chemistry Recent Labs  Lab 01/16/21 1410 01/17/21 0650  NA 137 137  K 4.0 3.9  CL 98 102  CO2 27 26  GLUCOSE 202* 126*  BUN 24* 20  CREATININE 1.03 1.05  CALCIUM 9.0 8.4*  GFRNONAA >60 >60  ANIONGAP 12 9    Lipids  Recent Labs  Lab 01/16/21 1810  CHOL 168  TRIG 63  HDL 44  LDLCALC 111*  CHOLHDL 3.8    Hematology Recent Labs  Lab 01/16/21 1410 01/17/21 0650 01/18/21 0600  WBC 11.8* 9.9 8.5  RBC 5.04 4.57 4.68  HGB 15.5 13.9 14.3  HCT 46.7 42.0 42.3  MCV 92.7 91.9 90.4  MCH 30.8 30.4 30.6  MCHC 33.2 33.1 33.8  RDW 16.9* 17.2* 17.0*  PLT 315 289 313   Thyroid  Recent Labs  Lab 01/16/21 1810  TSH 3.698    BNP Recent Labs  Lab 01/16/21 1410  BNP 59.4    DDimer No results for input(s): DDIMER in the last 168 hours.  Radiology    DG Chest Portable 1 View  Result Date: 01/16/2021 CLINICAL DATA:  Chest pain EXAM: PORTABLE CHEST 1 VIEW COMPARISON:  02/27/2014 FINDINGS: No focal consolidation. No pleural effusion or pneumothorax. Heart and mediastinal contours are unremarkable. No acute osseous abnormality. Severe osteoarthritis of bilateral glenohumeral joints. IMPRESSION: No active disease. Electronically Signed   By: Kathreen Devoid M.D.   On: 01/16/2021 14:36   ECHOCARDIOGRAM COMPLETE  Result Date: 01/17/2021    ECHOCARDIOGRAM REPORT   Patient Name:   Douglas [name] Date of Exam: 01/17/2021 Medical Rec #:  [id number]        Height:       72.0 in Accession #:    [id number]       Weight:       225.3 lb Date of Birth:  1951-03-21         BSA:          2.241 m Patient Age:    70 years         BP:           145/76 mmHg Patient Gender: M                HR:           82 bpm. Exam Location:   Inpatient Procedure: 2D Echo Indications:     NSTEMI  History:         Patient has no prior history of Echocardiogram examinations.                  Arrythmias:second degree AV block; Risk Factors:Hypertension                  and Diabetes.  Sonographer:     Johny Chess RDCS Referring Phys:  [id number] MATTHEW A CARLISLE Diagnosing Phys: Sanda Klein MD IMPRESSIONS  1. Left ventricular ejection fraction, by estimation, is 50 to 55%. The left ventricle has low normal function. The left ventricle demonstrates regional wall motion abnormalities (see scoring diagram/findings for description). Indeterminate diastolic filling due to E-A fusion. There is moderate hypokinesis of the left ventricular, basal-mid inferolateral wall.  2. Right ventricular systolic function is normal. The right ventricular size is normal.  3. The mitral valve is normal in structure. No evidence of mitral valve regurgitation.  4. The aortic valve is tricuspid. Aortic valve regurgitation is not visualized. Mild aortic valve sclerosis is present, with no evidence of aortic valve stenosis. FINDINGS  Left Ventricle: Left ventricular ejection fraction, by estimation, is 50 to 55%. The left ventricle has low normal function. The left ventricle demonstrates regional wall motion abnormalities. Moderate hypokinesis of the left ventricular, basal-mid inferolateral wall. The left ventricular internal cavity size was normal in size. There is borderline concentric left ventricular hypertrophy. Abnormal (paradoxical) septal motion, consistent with left bundle branch block. Indeterminate diastolic filling  due to E-A fusion. Right Ventricle: The right ventricular size is normal. No increase in right ventricular wall thickness. Right ventricular systolic function is normal. Left Atrium: Left atrial size was normal in size. Right Atrium: Right atrial size was normal in size. Pericardium: There is no evidence of pericardial effusion. Mitral Valve: The mitral  valve is normal in structure. Mild mitral annular calcification. No evidence of mitral valve regurgitation. Tricuspid Valve: The tricuspid valve is normal in structure. Tricuspid valve regurgitation is not demonstrated. Aortic Valve: The aortic valve is tricuspid. Aortic valve regurgitation is not visualized. Mild aortic valve sclerosis is present, with no evidence of aortic  valve stenosis. Pulmonic Valve: The pulmonic valve was not well visualized. Pulmonic valve regurgitation is not visualized. Aorta: The aortic root and ascending aorta are structurally normal, with no evidence of dilitation. IAS/Shunts: No atrial level shunt detected by color flow Doppler.  LEFT VENTRICLE PLAX 2D LVIDd:         4.60 cm      Diastology LVIDs:         3.20 cm      LV e' medial:  5.55 cm/s LV PW:         1.20 cm      LV e' lateral: 10.10 cm/s LV IVS:        1.30 cm LVOT diam:     2.10 cm LV SV:         56 LV SV Index:   25 LVOT Area:     3.46 cm  LV Volumes (MOD) LV vol d, MOD A2C: 111.0 ml LV vol s, MOD A2C: 66.5 ml LV SV MOD A2C:     44.5 ml RIGHT VENTRICLE             IVC RV S prime:     16.50 cm/s  IVC diam: 1.50 cm TAPSE (M-mode): 2.8 cm LEFT ATRIUM           Index        RIGHT ATRIUM           Index LA diam:      4.00 cm 1.78 cm/m   RA Area:     19.40 cm LA Vol (A2C): 68.7 ml 30.65 ml/m  RA Volume:   57.50 ml  25.66 ml/m  AORTIC VALVE LVOT Vmax:   88.10 cm/s LVOT Vmean:  55.700 cm/s LVOT VTI:    0.162 m  AORTA Ao Root diam: 3.60 cm Ao Asc diam:  3.40 cm  SHUNTS Systemic VTI:  0.16 m Systemic Diam: 2.10 cm Mihai Croitoru MD Electronically signed by Sanda Klein MD Signature Date/Time: 01/17/2021/3:49:20 PM    Final (Updated)     Cardiac Studies   Echocardiogram pending  Patient Profile     70 y.o. male with CLL, type 2 diabetes mellitus (diet-controlled), hypertension presenting with chest discomfort and presyncope and found to have frequent episodes of second-degree AV block Mobitz type I and newly recognized  LBBB.  Assessment & Plan    NSTEMI: No chest pain this am. His troponin is mildly elevated. LVEF preserved with basal inferior wall motion abnormality. New LBBB on EKG. Multiple coronary risk factors include diabetes, hypertension, family history of CAD and remote smoking history.  Cardiac cath is indicated and is planned for later today. He has no questions regarding his cath today. Continue high dose statin and ASA. His irbesartan and hydrochlorothiazide are on hold pending the cath.   2nd deg AV block (Mobitz 1)/LBBB: These may simply be expression of age-related conduction system disease, but with his current presentation need to consider the possibility that second-degree AV block is due to AV node ischemia due to right coronary artery disease.  No beta blockers. If there is no obstructive CAD will need EP consult for pacemaker.   DM: Controlled with diet only A1c 6.7%.  CLL: Reevaluate treatment with ibrutinib if he needs long-term antiplatelet agents.  Note that currently he does not have either anemia or thrombocytopenia and a WBC count is 8500.      For questions or updates, please contact [place] Please consult [email or web address] for contact info under  Signed, Lauree Chandler, MD  01/18/2021, 7:42 AM

## 2021-01-18 NOTE — Progress Notes (Signed)
Paged overnight regarding higher deg AVB. Patient asx but did have short run of high deg AVB/CHB after midnight. Was awake during this time with no complaints. Depending on results of coronary angiography may ultimately require PPM with higher deg AVB now seen.

## 2021-01-18 NOTE — Consult Note (Signed)
Cardiology Consultation:   Patient ID: Jakaden Ouzts MRN: [id number]; DOB: [birth year]  Admit date: 01/16/2021 Date of Consult: 01/18/2021  PCP:  [name], MD   [facility] Providers Cardiologist:  new to [place], Dr. Sallyanne Kuster   Patient Profile:   Douglas [name] is a 70 y.o. 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 Angelena Form.  History of Present Illness:   Mr. Frenkel to note at his last oncology visit in June 2022, "He has achieved a complete hematological response at this time with resolution of his cervical adenopathy.  After discussion today he we opted to give him a treatment break and reevaluate in 3 months" The patient though says just like the last time they tried to take a break after 3 days  he started have lymph swelling in his neck and night sweats and is back on tx  He was admitted to [facility] 01/16/21 with sudden onset of dizziness/weakness, associated with CP, diaphoresis, eventually with ongoing symptoms family insisted on calling EMS. Initially in the ER, GI cocktail gave him relief Cardiology called/admitted with NSTEMI New (?) LBBB noted (no historical EKGs) Hew was observed on telemetry to have Mobitz I block more so overnight with rates to the 30's, some during awake periods as well His BB held yesterday And with ongoing intermittent CP yesterday planned for [name]  Overnight last night cardiology called noted short run of CHB, pt reported to have been awake and without symptom  Cath today without CAD and EP is asked to see for perhaps symptomatic bradycardia and consideration for PPM need.  LABS K+ 4.0 >>>> 3,9 BUN/Creat 24/1.03 >>> 20/1.05 BNP 59.4 HS Trop 72 > 296 Lactic acid 2.7 > 1.5 WBC 11.8 >>> 8.5 H/H 15/46 >>>  14/42 Plts 315 >>> 313 TSH 3.698   He reports the events happened just as noted by the H&P, suddenly felt weak, dizzy, clammy,  No symptoms leading into this hospitalization. His initial symptoms were abrupt on onset, initially though was GI/reflux, but persisted feeling poorly, mostly with chest discomfort, also weak, even after sitting, TUMS did not seem to help, and after about an hour family found him pale and clammy and EMS was called  EMS HRs all tachycardic 110's-120's  Says generally he feels tired this has been blamed on his chemo and not new. He has never felt like fainting or fainted before the day of his admission Has not had any symptoms ofd dizziness, near syncope here.  He has NOT had his Ibrutinib while here and last night did have night sweats again   Past Medical History:  Diagnosis Date   BPH (benign prostatic hyperplasia)    CLL (chronic lymphocytic leukemia) ([place])    Hypertension     History reviewed. No pertinent surgical history.   Home Medications:  Prior to Admission medications   Medication Sig Start Date End Date Taking? Authorizing Provider  albuterol (PROVENTIL HFA;VENTOLIN HFA) 108 (90 BASE) MCG/ACT inhaler Inhale 2 puffs into the lungs 4 (four) times daily. Patient taking differently: Inhale 2 puffs into the lungs every 6 (six) hours as needed for shortness of breath or wheezing. 07/18/13  Yes Harden Mo, MD  amLODipine (NORVASC) 5 MG tablet Take 5 mg by mouth daily.   Yes  [provider]  azelastine (ASTELIN) 0.1 % nasal spray Place 1 spray into both nostrils as needed for rhinitis. Use in each nostril as directed   Yes [provider]  IMBRUVICA 420 MG TABS TAKE 1 TABLET BY MOUTH ONCE DAILY WITH A FULL GLASS OF  WATER 12/22/20  Yes Shadad, Mathis Dad, MD  loratadine (CLARITIN) 10 MG tablet Take 10 mg by mouth daily as needed.    Yes [provider]  montelukast (SINGULAIR) 10 MG tablet Take 10 mg by mouth as needed.   Yes  [provider]  terazosin (HYTRIN) 10 MG capsule Take 10 mg by mouth at bedtime.   Yes [provider]  valsartan-hydrochlorothiazide (DIOVAN-HCT) 320-25 MG tablet Take 1 tablet by mouth daily.   Yes [provider]    Inpatient Medications: Scheduled Meds:  amLODipine  5 mg Oral Daily   aspirin EC  81 mg Oral Daily   atorvastatin  80 mg Oral QHS   nitroGLYCERIN  0.5 inch Topical Once   sodium chloride flush  3 mL Intravenous Q12H   sodium chloride flush  3 mL Intravenous Q12H   terazosin  10 mg Oral Daily   Continuous Infusions:  sodium chloride 125 mL/hr at 01/17/21 0300   sodium chloride     sodium chloride     PRN Meds: sodium chloride, acetaminophen, albuterol, hydrALAZINE, labetalol, nitroGLYCERIN, ondansetron (ZOFRAN) IV, sodium chloride flush  Allergies:   No Known Allergies  Social History:   Social History   Socioeconomic History   Marital status: Married    Spouse name: Not on file   Number of children: Not on file   Years of education: Not on file   Highest education level: Not on file  Occupational History   Not on file  Tobacco Use   Smoking status: Former   Smokeless tobacco: Never  Substance and Sexual Activity   Alcohol use: Not on file   Drug use: Not on file   Sexual activity: Not on file  Other Topics Concern   Not on file  Social History Narrative   Not on file   Social Determinants of Health   Financial Resource Strain: Not on file  Food Insecurity: Not on file  Transportation Needs: Not on file  Physical Activity: Not on file  Stress: Not on file  Social Connections: Not on file  Intimate Partner Violence: Not on file    Family History:   Family History  Problem Relation Age of Onset   Lung cancer Mother    Coronary artery disease Father      ROS:  Please see the history of present illness.  All other ROS reviewed and negative.     Physical Exam/Data:   Vitals:   01/18/21 1409 01/18/21 1410  01/18/21 1414 01/18/21 1416  BP:  (!) 156/83 (!) 160/79 (!) 161/85  Pulse: 81 83 92 78  Resp: 10 14 18 12   Temp:      TempSrc:      SpO2: 98% 98% 99% 98%  Weight:      Height:       No intake or output data in the 24 hours ending 01/18/21 1431 Last 3 Weights 01/16/2021 01/16/2021 10/08/2020  Weight (lbs) 225 lb 5 oz 218 lb 227 lb 6.4 oz  Weight (kg) 102.2 kg 98.884 kg 103.148 kg     Body mass index is 30.56 kg/m.  General:  Well nourished, well developed, in no acute distress HEENT: normal Neck: no  JVD Vascular: No carotid bruits Cardiac:  RRR; no murmurs, gallops or rubs Lungs:  CTA b/l, no wheezing, rhonchi or rales  Abd: soft, nontender, obese  Ext: trace edema Musculoskeletal:  No deformities Skin: warm and dry  Neuro:  no focal abnormalities noted Psych:  Normal affect   EKG:  The EKG was personally reviewed and demonstrates:    Baseline artifact, is regular 116bpm, PACvs PVC, LBBB Baseline artifact, looks SR 83bpm, 1st degree AVBLock, PVC, LBBB SR, Mobitz One 51bpm, LBBB SR, Mobitz one, 54bpm, LBBB  Telemetry:  Telemetry was personally reviewed and demonstrates:   SR 80's, long 1st degree AVblock, nocturnal more noted Mobitz one with 2:1 block rates dipping to 30's  Relevant CV Studies:    01/18/21; [name]   Mid LAD lesion is 20% stenosed.   Mild non-obstructive CAD Normal LV filling pressure Recommendations: No obvious obstructive CAD. Will ask EP team to see him today to discuss a permanent pacemaker given high grade AV block and near syncope.    01/17/2021: TTE IMPRESSIONS   1. Left ventricular ejection fraction, by estimation, is 50 to 55%. The  left ventricle has low normal function. The left ventricle demonstrates  regional wall motion abnormalities (see scoring diagram/findings for  description). Indeterminate diastolic  filling due to E-A fusion. There is moderate hypokinesis of the left  ventricular, basal-mid inferolateral wall.   2. Right  ventricular systolic function is normal. The right ventricular  size is normal.   3. The mitral valve is normal in structure. No evidence of mitral valve  regurgitation.   4. The aortic valve is tricuspid. Aortic valve regurgitation is not  visualized. Mild aortic valve sclerosis is present, with no evidence of  aortic valve stenosis.   Laboratory Data:  High Sensitivity Troponin:   Recent Labs  Lab 01/16/21 1410 01/16/21 1608  TROPONINIHS 72* 296*     Chemistry Recent Labs  Lab 01/16/21 1410 01/17/21 0650  NA 137 137  K 4.0 3.9  CL 98 102  CO2 27 26  GLUCOSE 202* 126*  BUN 24* 20  CREATININE 1.03 1.05  CALCIUM 9.0 8.4*  GFRNONAA >60 >60  ANIONGAP 12 9    No results for input(s): PROT, ALBUMIN, AST, ALT, ALKPHOS, BILITOT in the last 168 hours. Lipids  Recent Labs  Lab 01/16/21 1810  CHOL 168  TRIG 63  HDL 44  LDLCALC 111*  CHOLHDL 3.8    Hematology Recent Labs  Lab 01/16/21 1410 01/17/21 0650 01/18/21 0600  WBC 11.8* 9.9 8.5  RBC 5.04 4.57 4.68  HGB 15.5 13.9 14.3  HCT 46.7 42.0 42.3  MCV 92.7 91.9 90.4  MCH 30.8 30.4 30.6  MCHC 33.2 33.1 33.8  RDW 16.9* 17.2* 17.0*  PLT 315 289 313   Thyroid  Recent Labs  Lab 01/16/21 1810  TSH 3.698    BNP Recent Labs  Lab 01/16/21 1410  BNP 59.4    DDimer No results for input(s): DDIMER in the last 168 hours.   Radiology/Studies:  DG Chest Portable 1 View Result Date: 01/16/2021 CLINICAL DATA:  Chest pain EXAM: PORTABLE CHEST 1 VIEW COMPARISON:  02/27/2014 FINDINGS: No focal consolidation. No pleural effusion or pneumothorax. Heart and mediastinal contours are unremarkable. No acute osseous abnormality. Severe osteoarthritis of bilateral glenohumeral joints. IMPRESSION: No active disease. Electronically Signed   By: Kathreen Devoid M.D.   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  Preserved LVEF 50-55%, with hypokinesis of the left  ventricular, basal-mid inferolateral wall.    Last dose of metoprolol (80m BID) was 01/16/21 at 21:48, last evening's block would be 24hours off drug He is on amlodipine, not felt to contribute Review of Imbruvica, I do not see brady/AB block as side effect   I discussed at length with the patient and his wife his baseline conduction disease Small dose of betablocker  He is not on metoprolol at home His presenting symptoms were acute onset of dizziness, weakness, near syncope, diaphoresis. Hot flashes?vagal   I suspect we will plan/recommend pacing Will review the case and Dr. LQuentin Orewill see him later today    Risk Assessment/Risk Scores:    For questions or updates, please contact [facility] Please consult [email or web address] for contact info under    Signed, RBaldwin Jamaica PA-C  01/18/2021 2:31 PM

## 2021-01-18 NOTE — Progress Notes (Signed)
ANTICOAGULATION CONSULT NOTE  Pharmacy Consult for Heparin Indication: chest pain/ACS  No Known Allergies  Patient Measurements: Height: 6' (182.9 cm) Weight: 102.2 kg (225 lb 5 oz) IBW/kg (Calculated) : 77.6 Heparin Dosing Weight: 97.6 kg  Vital Signs: Temp: 98.2 F (36.8 C) (10/10 0818) Temp Source: Oral (10/10 0818) BP: 169/83 (10/10 0410) Pulse Rate: 74 (10/10 0410)  Labs: Recent Labs    01/16/21 1410 01/16/21 1608 01/16/21 2102 01/17/21 0316 01/17/21 0650 01/17/21 1239 01/17/21 2114 01/18/21 0600  HGB 15.5  --   --   --  13.9  --   --  14.3  HCT 46.7  --   --   --  42.0  --   --  42.3  PLT 315  --   --   --  289  --   --  313  APTT  --   --  52*  --   --   --   --   --   LABPROT  --   --  13.4  --   --   --   --   --   INR  --   --  1.0  --   --   --   --   --   HEPARINUNFRC  --   --   --    < >  --  0.12* 0.23* 0.45  CREATININE 1.03  --   --   --  1.05  --   --   --   TROPONINIHS 72* 296*  --   --   --   --   --   --    < > = values in this interval not displayed.     Estimated Creatinine Clearance: 78.6 mL/min (by C-G formula based on SCr of 1.05 mg/dL).   Medical History: Past Medical History:  Diagnosis Date   BPH (benign prostatic hyperplasia)    CLL (chronic lymphocytic leukemia) ([facility])    Hypertension     Medications:  Medications Prior to Admission  Medication Sig Dispense Refill Last Dose   albuterol (PROVENTIL HFA;VENTOLIN HFA) 108 (90 BASE) MCG/ACT inhaler Inhale 2 puffs into the lungs 4 (four) times daily. (Patient taking differently: Inhale 2 puffs into the lungs every 6 (six) hours as needed for shortness of breath or wheezing.) 1 Inhaler 0 01/16/2021   amLODipine (NORVASC) 5 MG tablet Take 5 mg by mouth daily.   01/16/2021   azelastine (ASTELIN) 0.1 % nasal spray Place 1 spray into both nostrils as needed for rhinitis. Use in each nostril as directed   More than a month ago   IMBRUVICA 420 MG TABS TAKE 1 TABLET BY MOUTH ONCE DAILY WITH A  FULL GLASS OF  WATER 28 tablet 1 01/15/2021   loratadine (CLARITIN) 10 MG tablet Take 10 mg by mouth daily as needed.    More than a month ago   montelukast (SINGULAIR) 10 MG tablet Take 10 mg by mouth as needed.   More than a month ago   terazosin (HYTRIN) 10 MG capsule Take 10 mg by mouth at bedtime.   01/16/2021   valsartan-hydrochlorothiazide (DIOVAN-HCT) 320-25 MG tablet Take 1 tablet by mouth daily.   01/16/2021    Scheduled:   amLODipine  5 mg Oral Daily   aspirin EC  81 mg Oral Daily   atorvastatin  80 mg Oral QHS   nitroGLYCERIN  0.5 inch Topical Once   sodium chloride flush  3 mL Intravenous Q12H  terazosin  10 mg Oral Daily   Infusions:   sodium chloride 125 mL/hr at 01/17/21 0300   sodium chloride     sodium chloride 1 mL/kg/hr (01/18/21 0407)   heparin 1,850 Units/hr (01/18/21 0119)    Assessment: 78 yoM admitted with CP started on IV heparin.  Heparin level therapeutic, CBC ok, cath today.  Goal of Therapy:  Heparin level 0.3-0.7 units/ml Monitor platelets by anticoagulation protocol: Yes   Plan:  -Continue heparin 1850 units/h -Daily heparin level and CBC  Arrie Senate, PharmD, BCPS, [facility] Clinical Pharmacist [phone] Please check AMION for all [facility] numbers 01/18/2021

## 2021-01-19 ENCOUNTER — Encounter (HOSPITAL_COMMUNITY)
Admission: EM | Disposition: A | Discharge status: Home / Self Care | Payer: Self-pay | Source: Home / Self Care | Attending: Student in an Organized Health Care Education/Training Program

## 2021-01-19 DIAGNOSIS — I442 Atrioventricular block, complete: Secondary | ICD-10-CM

## 2021-01-19 DIAGNOSIS — R001 Bradycardia, unspecified: Secondary | ICD-10-CM

## 2021-01-19 HISTORY — PX: PACEMAKER IMPLANT: EP1218

## 2021-01-19 LAB — SURGICAL PCR SCREEN
MRSA, PCR: NEGATIVE
Staphylococcus aureus: NEGATIVE

## 2021-01-19 LAB — CBC
HCT: 42.7 % (ref 39.0–52.0)
Hemoglobin: 13.8 g/dL (ref 13.0–17.0)
MCH: 29.8 pg (ref 26.0–34.0)
MCHC: 32.3 g/dL (ref 30.0–36.0)
MCV: 92.2 fL (ref 80.0–100.0)
Platelets: 326 10*3/uL (ref 150–400)
RBC: 4.63 MIL/uL (ref 4.22–5.81)
RDW: 16.9 % — ABNORMAL HIGH (ref 11.5–15.5)
WBC: 7.6 10*3/uL (ref 4.0–10.5)
nRBC: 0 % (ref 0.0–0.2)

## 2021-01-19 LAB — BASIC METABOLIC PANEL
Anion gap: 6 (ref 5–15)
BUN: 15 mg/dL (ref 8–23)
CO2: 29 mmol/L (ref 22–32)
Calcium: 8.6 mg/dL — ABNORMAL LOW (ref 8.9–10.3)
Chloride: 104 mmol/L (ref 98–111)
Creatinine, Ser: 0.99 mg/dL (ref 0.61–1.24)
GFR, Estimated: >60 mL/min (ref >60)
Glucose, Bld: 133 mg/dL — ABNORMAL HIGH (ref 70–99)
Potassium: 3.6 mmol/L (ref 3.5–5.1)
Sodium: 139 mmol/L (ref 135–145)

## 2021-01-19 SURGERY — PACEMAKER IMPLANT

## 2021-01-19 MED ORDER — ACETAMINOPHEN 325 MG PO TABS: 325.0000 mg | ORAL_TABLET | ORAL | Status: DC | PRN

## 2021-01-19 MED ORDER — MIDAZOLAM HCL 5 MG/5ML IJ SOLN
INTRAMUSCULAR | Status: AC
  Filled 2021-01-19: qty 5

## 2021-01-19 MED ORDER — ONDANSETRON HCL 4 MG/2ML IJ SOLN: 4.0000 mg | Freq: Four times a day (QID) | INTRAMUSCULAR | Status: DC | PRN

## 2021-01-19 MED ORDER — FENTANYL CITRATE (PF) 100 MCG/2ML IJ SOLN
INTRAMUSCULAR | Status: DC | PRN
  Administered 2021-01-19 (×4): 12.5 ug via INTRAVENOUS

## 2021-01-19 MED ORDER — CEFAZOLIN SODIUM-DEXTROSE 2-4 GM/100ML-% IV SOLN
INTRAVENOUS | Status: AC
  Filled 2021-01-19: qty 100

## 2021-01-19 MED ORDER — LIDOCAINE HCL (PF) 1 % IJ SOLN
INTRAMUSCULAR | Status: DC | PRN
  Administered 2021-01-19: 60 mL

## 2021-01-19 MED ORDER — SODIUM CHLORIDE 0.9 % IV SOLN
INTRAVENOUS | Status: AC
  Filled 2021-01-19: qty 2

## 2021-01-19 MED ORDER — LIDOCAINE HCL (PF) 1 % IJ SOLN
INTRAMUSCULAR | Status: AC
  Filled 2021-01-19: qty 60

## 2021-01-19 MED ORDER — CEFAZOLIN SODIUM-DEXTROSE 1-4 GM/50ML-% IV SOLN
1.0000 g | Freq: Four times a day (QID) | INTRAVENOUS | Status: AC
  Administered 2021-01-19 – 2021-01-20 (×3): 1 g via INTRAVENOUS
  Filled 2021-01-19 (×3): qty 50

## 2021-01-19 MED ORDER — HEPARIN (PORCINE) IN NACL 1000-0.9 UT/500ML-% IV SOLN
INTRAVENOUS | Status: DC | PRN
  Administered 2021-01-19: 500 mL

## 2021-01-19 MED ORDER — HEPARIN (PORCINE) IN NACL 1000-0.9 UT/500ML-% IV SOLN
INTRAVENOUS | Status: AC
  Filled 2021-01-19: qty 500

## 2021-01-19 MED ORDER — MIDAZOLAM HCL 5 MG/5ML IJ SOLN
INTRAMUSCULAR | Status: DC | PRN
  Administered 2021-01-19 (×4): 1 mg via INTRAVENOUS

## 2021-01-19 MED ORDER — CEFAZOLIN SODIUM-DEXTROSE 2-3 GM-%(50ML) IV SOLR
INTRAVENOUS | Status: AC | PRN
  Administered 2021-01-19: 2 g via INTRAVENOUS

## 2021-01-19 MED ORDER — FENTANYL CITRATE (PF) 100 MCG/2ML IJ SOLN
INTRAMUSCULAR | Status: AC
  Filled 2021-01-19: qty 2

## 2021-01-19 SURGICAL SUPPLY — 12 items
CABLE SURGICAL S-101-97-12 (CABLE) ×2 IMPLANT
CATH RIGHTSITE C315HIS02 (CATHETERS) ×2 IMPLANT
IPG PACE AZUR XT DR MRI W1DR01 (Pacemaker) ×1 IMPLANT
LEAD CAPSURE NOVUS 5076-52CM (Lead) ×2 IMPLANT
LEAD SELECT SECURE 3830 383069 (Lead) ×1 IMPLANT
PACE AZURE XT DR MRI W1DR01 (Pacemaker) ×2 IMPLANT
PAD PRO RADIOLUCENT 2001M-C (PAD) ×2 IMPLANT
SELECT SECURE 3830 383069 (Lead) ×2 IMPLANT
SHEATH 7FR PRELUDE SNAP 13 (SHEATH) ×4 IMPLANT
SLITTER 6232ADJ (MISCELLANEOUS) ×2 IMPLANT
TRAY PACEMAKER INSERTION (PACKS) ×2 IMPLANT
WIRE HI TORQ VERSACORE-J 145CM (WIRE) ×2 IMPLANT

## 2021-01-19 NOTE — Progress Notes (Addendum)
Progress Note  Patient Name: Douglas [name] Date of Encounter: 01/19/2021  [facility] Cardiologist: new, Dr. Sallyanne Kuster  Subjective   up and ambulating  Inpatient Medications    Scheduled Meds:  amLODipine  5 mg Oral Daily   aspirin EC  81 mg Oral Daily   atorvastatin  80 mg Oral QHS   chlorhexidine  60 mL Topical Once   gentamicin irrigation  80 mg Irrigation On Call   nitroGLYCERIN  0.5 inch Topical Once   sodium chloride flush  3 mL Intravenous Q12H   sodium chloride flush  3 mL Intravenous Q12H   sodium chloride flush  3 mL Intravenous Q12H   terazosin  10 mg Oral Daily   Continuous Infusions:  sodium chloride 125 mL/hr at 01/17/21 0300   sodium chloride     sodium chloride 50 mL/hr at 01/19/21 1779   sodium chloride      ceFAZolin (ANCEF) IV     PRN Meds: sodium chloride, acetaminophen, albuterol, nitroGLYCERIN, ondansetron (ZOFRAN) IV, sodium chloride flush, sodium chloride flush   Vital Signs    Vitals:   01/18/21 1920 01/18/21 2349 01/19/21 0515 01/19/21 0740  BP: (!) 149/73 (!) 153/76 132/73 (!) 142/73  Pulse: 82 78 73 74  Resp: 18 19 (!) 21 15  Temp: 98.4 F (36.9 C) 98.7 F (37.1 C) (!) 97.5 F (36.4 C) 98.4 F (36.9 C)  TempSrc: Oral Oral Oral Oral  SpO2: 95% 95% 93% 94%  Weight:      Height:        Intake/Output Summary (Last 24 hours) at 01/19/2021 0937 Last data filed at 01/18/2021 1700 Gross per 24 hour  Intake 3867.53 ml  Output --  Net 3867.53 ml   Last 3 Weights 01/16/2021 01/16/2021 10/08/2020  Weight (lbs) 225 lb 5 oz 218 lb 227 lb 6.4 oz  Weight (kg) 102.2 kg 98.884 kg 103.148 kg      Telemetry    SR, 1st degree AVblock is maked as long as 42m, Mobitz I and mostly nocturnal 2:1 - Personally Reviewed  ECG    No new EKGs - Personally Reviewed  Physical Exam   GEN: No acute distress.   Neck: No JVD Cardiac: RRR, no murmurs, rubs, or gallops.  Respiratory: CTA b/l. GI: Soft, nontender, non-distended  MS: No  edema; No deformity. Neuro:  Nonfocal  Psych: Normal affect   R radial site is stable, no bleeding or hematoma, denies pain  Labs    High Sensitivity Troponin:   Recent Labs  Lab 01/16/21 1410 01/16/21 1608  TROPONINIHS 72* 296*     Chemistry Recent Labs  Lab 01/16/21 1410 01/17/21 0650 01/19/21 0022  NA 137 137 139  K 4.0 3.9 3.6  CL 98 102 104  CO2 27 26 29   GLUCOSE 202* 126* 133*  BUN 24* 20 15  CREATININE 1.03 1.05 0.99  CALCIUM 9.0 8.4* 8.6*  GFRNONAA >60 >60 >60  ANIONGAP 12 9 6     Lipids  Recent Labs  Lab 01/16/21 1810  CHOL 168  TRIG 63  HDL 44  LDLCALC 111*  CHOLHDL 3.8    Hematology Recent Labs  Lab 01/17/21 0650 01/18/21 0600 01/19/21 0022  WBC 9.9 8.5 7.6  RBC 4.57 4.68 4.63  HGB 13.9 14.3 13.8  HCT 42.0 42.3 42.7  MCV 91.9 90.4 92.2  MCH 30.4 30.6 29.8  MCHC 33.1 33.8 32.3  RDW 17.2* 17.0* 16.9*  PLT 289 313 326   Thyroid  Recent Labs  Lab 01/16/21 1810  TSH 3.698    BNP Recent Labs  Lab 01/16/21 1410  BNP 59.4    DDimer No results for input(s): DDIMER in the last 168 hours.   Radiology     Cardiac Studies   01/18/21; [name]   Mid LAD lesion is 20% stenosed.   Mild non-obstructive CAD Normal LV filling pressure Recommendations: No obvious obstructive CAD. Will ask EP team to see him today to discuss a permanent pacemaker given high grade AV block and near syncope.      01/17/2021: TTE IMPRESSIONS   1. Left ventricular ejection fraction, by estimation, is 50 to 55%. The  left ventricle has low normal function. The left ventricle demonstrates  regional wall motion abnormalities (see scoring diagram/findings for  description). Indeterminate diastolic  filling due to E-A fusion. There is moderate hypokinesis of the left  ventricular, basal-mid inferolateral wall.   2. Right ventricular systolic function is normal. The right ventricular  size is normal.   3. The mitral valve is normal in structure. No evidence of  mitral valve  regurgitation.   4. The aortic valve is tricuspid. Aortic valve regurgitation is not  visualized. Mild aortic valve sclerosis is present, with no evidence of  aortic valve stenosis.   Patient Profile     70 y.o. 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, on Ibrutinib 420 mg started in August 2021), HTN, BPH, DM, HLD, remote smoker admitted with near syncope and CP  Assessment & Plan    Dizziness Variable Heart block  1st degree Avblock, 455m Mobitz one and 2:1 Transient CHB  IVCD/LBBB, no historical EKGs to know if old/new, though the patient reports his PMD did an EKG a year ago and was reported as normal Now 48 hours with BB stopped, we continue to see rates to the 30's with Mobitz one and 2:1 AVblock overnight Daytime HRs 70's-80's with Mobitz one and marked 1st degree AVblock as well   Preserved LVEF 50-55%, with hypokinesis of the left  ventricular, basal-mid inferolateral wall.   Pt described his symptoms at home prior to coming in as a light switch, sudden onset of near syncope that persisted with weakness, diaphoresis and some degree of stinging CP that lasted perhaps an hour, though seated/laying not near syncopal, and just prior to EMS arriving, just as suddenly felt better.  Given degree of conduction system disease and symptom behavior recommend PPM implant Planned for today with Dr. TLovena Lewho has seen and examined the patient Patient remains agreeable to proceed    For questions or updates, please contact [facility] consult [email or web address] for contact info under     Signed, RBaldwin Jamaica PA-C  01/19/2021, 9:37 AM    EP Attending  Patient seen and examined. AGree with the findings as noted above. He has marked conduction system disease with syncope. I have discussed the indications/risks/benefits/goals/expectations of PPM insertion and he wishes to proceed.  GCarleene OverlieTaylor,MD

## 2021-01-19 NOTE — Discharge Summary (Addendum)
ELECTROPHYSIOLOGY PROCEDURE DISCHARGE SUMMARY    Patient ID: Douglas [name],  MRN: [id number], DOB/AGE: Sep 22, 1950 70 y.o.  Admit date: 01/16/2021 Discharge date: 01/20/21  Primary Care Physician: [name], MD  Primary Cardiologist: new, Dr. Sallyanne Kuster Electrophysiologist: new, 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  Mild non-obstructive CAD Normal LV filling pressure   2.  Implantation of a MDT dual chamber PPM on 01/19/21 by Dr Lovena Le.  Th There were no immediate post procedure complications. CXR on 01/20/21 demonstrated no pneumothorax status post device implantation.   Brief HPI: Douglas [name] is a 70 y.o. male was admitted to [facility] 01/16/21 came via EMS for acute onset of near syncope , CP.  He had largely unremarkable labs though mild HS Trop elevation and LBBB of unknown chronicity was admitted as NSTEMI by cardiology service  Hospital Course:  The patient was admitted monitored on telemetry noting particularly nocturnal bradycardia with progressive heart block including Mobitz one at baseline with 2:1 and transient CHB reported as well, and without symptoms.  Metoprolol that was started for NSTEMI was discontinued and planned for [name] to better evaluate his NSTEMI, symptoms and conduction disease.  He had TTE with preserved LVEF mild WMA and underwent [name] with minimal CAD only noted. EP was consulted to evaluate and consider PPM  Given much of his advanced heart block and bradycardia was nocturnal after much conversation with the patient given symptoms and significant baseline conduction system disease, recommended PPM implant  He underwent implantation of a PPM with details as  outlined above.  Left chest was without hematoma or ecchymosis.  The device was interrogated and found to be functioning normally.  CXR was obtained and demonstrated no pneumothorax status post device implantation.  Wound care, arm mobility, and restrictions were reviewed with the patient, including Radial site care.  The patient feels well, denies any CP/SOB, with minimal site discomfort.  He was examined by Dr. Curt Bears and considered stable for discharge to home.   No changes to his home medicines planned   Physical Exam: Vitals:   01/19/21 2000 01/19/21 2321 01/20/21 0325 01/20/21 0712  BP:  (!) 166/68 (!) 148/79 (!) 166/69  Pulse:  87 68 74  Resp:  20 16 16   Temp: 98.6 F (37 C) 98.2 F (36.8 C) 97.8 F (36.6 C) 97.8 F (36.6 C)  TempSrc: Oral Oral Oral Oral  SpO2:  94% 95% 95%  Weight:      Height:        GEN- The patient is well appearing, alert and oriented x 3 today.   HEENT: normocephalic, atraumatic; sclera clear, conjunctiva pink; hearing intact; oropharynx clear; neck supple, no JVP Lungs- CTA b/l, normal work of breathing.  No wheezes, rales, rhonchi Heart- RRR, no murmurs, rubs or gallops, PMI not laterally displaced GI- soft, non-tender, non-distended Extremities- no clubbing, cyanosis, or edema, R radial site is stable, + pulses, no bleeding or hematoma MS- no significant deformity or atrophy Skin- warm and dry, no rash or lesion, left chest without hematoma/ecchymosis Psych- euthymic mood, full affect Neuro- no gross deficits   Labs:   Lab Results  Component Value Date   WBC 7.9 01/20/2021   HGB 15.0  01/20/2021   HCT 45.2 01/20/2021   MCV 91.1 01/20/2021   PLT 290 01/20/2021    Recent Labs  Lab 01/19/21 0022  NA 139  K 3.6  CL 104  CO2 29  BUN 15  CREATININE 0.99  CALCIUM 8.6*  GLUCOSE 133*    Discharge Medications:  Allergies as of 01/20/2021   No Known Allergies      Medication List     TAKE these medications    albuterol 108 (90  Base) MCG/ACT inhaler Commonly known as: VENTOLIN HFA Inhale 2 puffs into the lungs 4 (four) times daily. What changed:  when to take this reasons to take this   amLODipine 5 MG tablet Commonly known as: NORVASC Take 5 mg by mouth daily.   azelastine 0.1 % nasal spray Commonly known as: ASTELIN Place 1 spray into both nostrils as needed for rhinitis. Use in each nostril as directed   Imbruvica 420 MG Tabs Generic drug: ibrutinib TAKE 1 TABLET BY MOUTH ONCE DAILY WITH A FULL GLASS OF  WATER   loratadine 10 MG tablet Commonly known as: CLARITIN Take 10 mg by mouth daily as needed.   montelukast 10 MG tablet Commonly known as: SINGULAIR Take 10 mg by mouth as needed.   terazosin 10 MG capsule Commonly known as: HYTRIN Take 10 mg by mouth at bedtime.   valsartan-hydrochlorothiazide 320-25 MG tablet Commonly known as: DIOVAN-HCT Take 1 tablet by mouth daily.               Discharge Care Instructions  (From admission, onward)           Start     Ordered   01/20/21 0000  Discharge wound care:       Comments: As per AVS   01/20/21 0935            Disposition: Home Discharge Instructions     Diet - low sodium heart healthy   Complete by: As directed    Discharge wound care:   Complete by: As directed    As per AVS   Increase activity slowly   Complete by: As directed        Follow-up Information     [facility] Follow up.   Specialty: Cardiology Why: 02/03/21 @ 10:40AM, wound check visit Contact information: [address] [place]        Evans Lance, MD Follow up.   Specialty: Cardiology Why: 04/29/21 @ 4:00PM Contact information: [address] [address] [phone]                 Duration of Discharge Encounter: Greater than 30 minutes including physician time.  SignedTommye Standard, PA-C 01/20/2021 9:35  AM   I have seen and examined this patient with Tommye Standard.  Agree with above, note added to reflect my findings.  On exam, RRR, no murmurs.  Patient was admitted with episodes of syncope.  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  Device functioning appropriately.  We Jaria Conway arrange for follow-up in device clinic.  Marcy Sookdeo M. Bayne Fosnaugh MD 01/20/2021 11:48 AM

## 2021-01-19 NOTE — Plan of Care (Signed)

## 2021-01-19 NOTE — Plan of Care (Signed)

## 2021-01-19 NOTE — Discharge Instructions (Addendum)
Post procedure care instructions (Right wrist, heart cath site) No lifting over 5 lbs for 1 week. No vigorous activity with this arm for 1 week.  Keep procedure site clean & dry. If you notice increased pain, swelling, bleeding or pus, call/return!  You may bath/wash this area after 24 hours gently, pat dry.   PLEASE ALSO NOTE INSTRUCTIONS BELOW         Supplemental Discharge Instructions for  Pacemaker Patients   Activity No heavy lifting or vigorous activity with your left/right arm for 6 to 8 weeks.  Do not raise your left/right arm above your head for one week.  Gradually raise your affected arm as drawn below.             01/24/21                   01/25/21                 01/26/21                 01/27/21 __  NO DRIVING for   1 week  ; you may begin driving on   38/93/73  .  WOUND CARE Keep the wound area clean and dry.  Do not get this area wet , no showers for one week; you may shower on  01/27/21  . The tape/steri-strips on your wound will fall off; do not pull them off.  No bandage is needed on the site.  DO  NOT apply any creams, oils, or ointments to the wound area. If you notice any drainage or discharge from the wound, any swelling or bruising at the site, or you develop a fever > 101? F after you are discharged home, call the office at once.  Special Instructions You are still able to use cellular telephones; use the ear opposite the side where you have your pacemaker/defibrillator.  Avoid carrying your cellular phone near your device. When traveling through airports, show security personnel your identification card to avoid being screened in the metal detectors.  Ask the security personnel to use the hand wand. Avoid arc welding equipment, MRI testing (magnetic resonance imaging), TENS units (transcutaneous nerve stimulators).  Call the office for questions about other devices. Avoid electrical appliances that are in poor condition or are not properly  grounded. Microwave ovens are safe to be near or to operate.

## 2021-01-20 ENCOUNTER — Encounter (HOSPITAL_COMMUNITY): Payer: Self-pay | Admitting: Internal Medicine

## 2021-01-20 ENCOUNTER — Inpatient Hospital Stay (HOSPITAL_COMMUNITY): Payer: 59

## 2021-01-20 LAB — CBC
HCT: 45.2 % (ref 39.0–52.0)
Hemoglobin: 15 g/dL (ref 13.0–17.0)
MCH: 30.2 pg (ref 26.0–34.0)
MCHC: 33.2 g/dL (ref 30.0–36.0)
MCV: 91.1 fL (ref 80.0–100.0)
Platelets: 290 10*3/uL (ref 150–400)
RBC: 4.96 MIL/uL (ref 4.22–5.81)
RDW: 16.8 % — ABNORMAL HIGH (ref 11.5–15.5)
WBC: 7.9 10*3/uL (ref 4.0–10.5)
nRBC: 0 % (ref 0.0–0.2)

## 2021-01-20 MED FILL — Cefazolin Sodium-Dextrose IV Solution 2 GM/100ML-4%: INTRAVENOUS | Qty: 100 | Status: AC

## 2021-01-27 ENCOUNTER — Other Ambulatory Visit: Payer: Self-pay

## 2021-01-27 ENCOUNTER — Inpatient Hospital Stay (HOSPITAL_BASED_OUTPATIENT_CLINIC_OR_DEPARTMENT_OTHER): Payer: 59 | Admitting: Oncology

## 2021-01-27 ENCOUNTER — Inpatient Hospital Stay: Payer: 59 | Attending: Oncology

## 2021-01-27 VITALS — BP 119/54 | HR 89 | Temp 97.9°F | Resp 20 | Ht 72.0 in | Wt 224.0 lb

## 2021-01-27 DIAGNOSIS — I499 Cardiac arrhythmia, unspecified: Secondary | ICD-10-CM | POA: Diagnosis not present

## 2021-01-27 DIAGNOSIS — C9110 Chronic lymphocytic leukemia of B-cell type not having achieved remission: Secondary | ICD-10-CM | POA: Insufficient documentation

## 2021-01-27 DIAGNOSIS — Z950 Presence of cardiac pacemaker: Secondary | ICD-10-CM | POA: Diagnosis not present

## 2021-01-27 LAB — CMP (CANCER CENTER ONLY)
ALT: 59 U/L — ABNORMAL HIGH (ref 0–44)
AST: 19 U/L (ref 15–41)
Albumin: 3.9 g/dL (ref 3.5–5.0)
Alkaline Phosphatase: 100 U/L (ref 38–126)
Anion gap: 14 (ref 5–15)
BUN: 21 mg/dL (ref 8–23)
CO2: 27 mmol/L (ref 22–32)
Calcium: 9.6 mg/dL (ref 8.9–10.3)
Chloride: 98 mmol/L (ref 98–111)
Creatinine: 0.93 mg/dL (ref 0.61–1.24)
GFR, Estimated: >60 mL/min (ref >60)
Glucose, Bld: 247 mg/dL — ABNORMAL HIGH (ref 70–99)
Potassium: 4 mmol/L (ref 3.5–5.1)
Sodium: 139 mmol/L (ref 135–145)
Total Bilirubin: 0.6 mg/dL (ref 0.3–1.2)
Total Protein: 6.9 g/dL (ref 6.5–8.1)

## 2021-01-27 LAB — CBC WITH DIFFERENTIAL (CANCER CENTER ONLY)
Abs Immature Granulocytes: 0.14 10*3/uL — ABNORMAL HIGH (ref 0.00–0.07)
Basophils Absolute: 0.1 10*3/uL (ref 0.0–0.1)
Basophils Relative: 1 %
Eosinophils Absolute: 0.1 10*3/uL (ref 0.0–0.5)
Eosinophils Relative: 1 %
HCT: 47.6 % (ref 39.0–52.0)
Hemoglobin: 15.7 g/dL (ref 13.0–17.0)
Immature Granulocytes: 1 %
Lymphocytes Relative: 49 %
Lymphs Abs: 5.9 10*3/uL — ABNORMAL HIGH (ref 0.7–4.0)
MCH: 30.3 pg (ref 26.0–34.0)
MCHC: 33 g/dL (ref 30.0–36.0)
MCV: 91.7 fL (ref 80.0–100.0)
Monocytes Absolute: 0.5 10*3/uL (ref 0.1–1.0)
Monocytes Relative: 4 %
Neutro Abs: 5.4 10*3/uL (ref 1.7–7.7)
Neutrophils Relative %: 44 %
Platelet Count: 308 10*3/uL (ref 150–400)
RBC: 5.19 MIL/uL (ref 4.22–5.81)
RDW: 16.8 % — ABNORMAL HIGH (ref 11.5–15.5)
Smear Review: NORMAL
WBC Count: 12.1 10*3/uL — ABNORMAL HIGH (ref 4.0–10.5)
nRBC: 0 % (ref 0.0–0.2)

## 2021-01-27 NOTE — Progress Notes (Signed)
Hematology and Oncology Follow Up Visit  Douglas [name] [id number] [birth year] 70 y.o. 01/27/2021 8:21 AM Douglas [name], MDMorrow, [name], MD   Principle Diagnosis: 70 year old man with CLL diagnosed in August 2020.  He was found to have stage I with ATM deletion and lymphadenopathy.  Current therapy: Ibrutinib 420 mg started in August 2021.  Interim History: Douglas [name] is here for a follow-up evaluation.  Since the last visit, he was hospitalized on January 16, 2021 after presenting with symptoms of chest pain and dizziness and near syncope.  He was diagnosed with heart block and required a pacemaker placement at that time.  Left heart catheterization showed mild stenosis but overall normal LV function.  Since his discharge he feels well at this time without any complaints.  He has resumed ibrutinib after 1 week interruption during his hospitalization.          Medications: Reviewed without changes. Current Outpatient Medications  Medication Sig Dispense Refill   albuterol (PROVENTIL HFA;VENTOLIN HFA) 108 (90 BASE) MCG/ACT inhaler Inhale 2 puffs into the lungs 4 (four) times daily. (Patient taking differently: Inhale 2 puffs into the lungs every 6 (six) hours as needed for shortness of breath or wheezing.) 1 Inhaler 0   amLODipine (NORVASC) 5 MG tablet Take 5 mg by mouth daily.     azelastine (ASTELIN) 0.1 % nasal spray Place 1 spray into both nostrils as needed for rhinitis. Use in each nostril as directed     IMBRUVICA 420 MG TABS TAKE 1 TABLET BY MOUTH ONCE DAILY WITH A FULL GLASS OF  WATER 28 tablet 1   loratadine (CLARITIN) 10 MG tablet Take 10 mg by mouth daily as needed.      montelukast (SINGULAIR) 10 MG tablet Take 10 mg by mouth as needed.     terazosin (HYTRIN) 10 MG capsule Take 10 mg by mouth at bedtime.     valsartan-hydrochlorothiazide (DIOVAN-HCT) 320-25 MG tablet Take 1 tablet by mouth daily.     No current facility-administered medications for this visit.      Allergies: No Known Allergies      Physical Exam:    Blood pressure (!) 119/54, pulse 89, temperature 97.9 F (36.6 C), temperature source Tympanic, resp. rate 20, height 6' (1.829 m), weight 224 lb (101.6 kg), SpO2 99 %.    ECOG: 0     General appearance: Comfortable appearing without any discomfort Head: Normocephalic without any trauma Oropharynx: Mucous membranes are moist and pink without any thrush or ulcers. Eyes: Pupils are equal and round reactive to light. Lymph nodes: No cervical, supraclavicular, inguinal or axillary lymphadenopathy.   Heart:regular rate and rhythm.  S1 and S2 without leg edema. Lung: Clear without any rhonchi or wheezes.  No dullness to percussion. Abdomin: Soft, nontender, nondistended with good bowel sounds.  No hepatosplenomegaly. Musculoskeletal: No joint deformity or effusion.  Full range of motion noted. Neurological: No deficits noted on motor, sensory and deep tendon reflex exam. Skin: No petechial rash or dryness.  Appeared moist.           Lab Results: Lab Results  Component Value Date   WBC 12.1 (H) 01/27/2021   HGB 15.7 01/27/2021   HCT 47.6 01/27/2021   MCV 91.7 01/27/2021   PLT 308 01/27/2021     Chemistry      Component Value Date/Time   NA 139 01/19/2021 0022   K 3.6 01/19/2021 0022   CL 104 01/19/2021 0022   CO2 29 01/19/2021 0022   BUN 15  01/19/2021 0022   CREATININE 0.99 01/19/2021 0022   CREATININE 0.94 10/08/2020 0809      Component Value Date/Time   CALCIUM 8.6 (L) 01/19/2021 0022   ALKPHOS 100 10/08/2020 0809   AST 18 10/08/2020 0809   ALT 33 10/08/2020 0809   BILITOT 0.7 10/08/2020 0809        Impression and Plan:   70 year old with:  1.  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     He continues to tolerate ibrutinib without any major complaints.  Laboratory data continues to show excellent hematological response with resolution of his adenopathy.   Risks and benefits of continuing ibrutinib were discussed at this time.  The risk of cardiac complications were discussed at this time.  Connecting his recent cardiac events with ibrutinib would be difficult at this time.  After discussion today given the fact that he is feeling well and tolerated ibrutinib without any issues I recommended continuing the same dose and schedule.  I reiterated potential complications predominantly atrial fibrillation and bleeding.  He is agreeable to continue.   2.  Cardiac arrhythmia: Pacemaker is in place continues to follow with cardiology.     3.  Follow-up: He will return in 3 months for repeat evaluation.   30  minutes were dedicated to this visit.  Time was spent on reviewing laboratory data, disease status update, treatment choices and future plan of care review.    [name], MD 10/19/20228:21 AM

## 2021-02-03 ENCOUNTER — Ambulatory Visit (INDEPENDENT_AMBULATORY_CARE_PROVIDER_SITE_OTHER): Payer: 59

## 2021-02-03 ENCOUNTER — Other Ambulatory Visit: Payer: Self-pay

## 2021-02-03 DIAGNOSIS — I441 Atrioventricular block, second degree: Secondary | ICD-10-CM

## 2021-02-03 LAB — CUP PACEART INCLINIC DEVICE CHECK
Battery Remaining Longevity: 139 mo
Battery Voltage: 3.22 V
Brady Statistic AP VP Percent: 12.53 %
Brady Statistic AP VS Percent: 0 %
Brady Statistic AS VP Percent: 84.59 %
Brady Statistic AS VS Percent: 2.88 %
Brady Statistic RA Percent Paced: 13.75 %
Brady Statistic RV Percent Paced: 97.12 %
Date Time Interrogation Session: 20221026105559
Implantable Lead Implant Date: 20221011
Implantable Lead Implant Date: 20221011
Implantable Lead Location: 753859
Implantable Lead Location: 753860
Implantable Lead Model: 3830
Implantable Lead Model: 5076
Implantable Pulse Generator Implant Date: 20221011
Lead Channel Impedance Value: 342 Ohm
Lead Channel Impedance Value: 380 Ohm
Lead Channel Impedance Value: 475 Ohm
Lead Channel Impedance Value: 532 Ohm
Lead Channel Pacing Threshold Amplitude: 0.625 V
Lead Channel Pacing Threshold Amplitude: 0.625 V
Lead Channel Pacing Threshold Pulse Width: 0.4 ms
Lead Channel Pacing Threshold Pulse Width: 0.4 ms
Lead Channel Sensing Intrinsic Amplitude: 4.375 mV
Lead Channel Sensing Intrinsic Amplitude: 6.875 mV
Lead Channel Setting Pacing Amplitude: 3.5 V
Lead Channel Setting Pacing Amplitude: 3.5 V
Lead Channel Setting Pacing Pulse Width: 0.4 ms
Lead Channel Setting Sensing Sensitivity: 0.9 mV

## 2021-02-03 NOTE — Patient Instructions (Signed)

## 2021-02-03 NOTE — Progress Notes (Signed)
Wound check appointment. Steri-strips removed. Wound without redness or edema. Incision edges approximated, wound well healed. Normal device function. Thresholds, sensing, and impedances consistent with implant measurements. Device programmed at 3.5V/auto capture programmed on for extra safety margin until 3 month visit. Histogram distribution appropriate for patient and level of activity. No mode switches. 1 VT-Monitor zone event logged, > 20 beats, unable to see termination. Patient educated about wound care, arm mobility, lifting restrictions. ROV in 3 months with implanting physician.  Patient reports today of intermittent chest pains since prior to device implant. Describes pain that starts as a dot in the center of chest then spreads out. Patient denies any radiation to arm, jaw or back. [name] in to speak with patient and no further treatment at this time. Patient advised per [name] is worsens or increases to give [place] a call. Patient verbalize understanding.

## 2021-02-04 ENCOUNTER — Telehealth: Payer: Self-pay | Admitting: Oncology

## 2021-02-04 NOTE — Telephone Encounter (Signed)
Rescheduled per 10/27 pt request

## 2021-02-10 ENCOUNTER — Ambulatory Visit (INDEPENDENT_AMBULATORY_CARE_PROVIDER_SITE_OTHER): Payer: 59

## 2021-02-10 ENCOUNTER — Encounter: Payer: Self-pay | Admitting: Podiatry

## 2021-02-10 ENCOUNTER — Ambulatory Visit (INDEPENDENT_AMBULATORY_CARE_PROVIDER_SITE_OTHER): Payer: 59 | Admitting: Podiatry

## 2021-02-10 ENCOUNTER — Other Ambulatory Visit: Payer: Self-pay

## 2021-02-10 DIAGNOSIS — M7752 Other enthesopathy of left foot: Secondary | ICD-10-CM

## 2021-02-10 DIAGNOSIS — M79671 Pain in right foot: Secondary | ICD-10-CM

## 2021-02-10 DIAGNOSIS — M778 Other enthesopathies, not elsewhere classified: Secondary | ICD-10-CM | POA: Diagnosis not present

## 2021-02-10 DIAGNOSIS — M79672 Pain in left foot: Secondary | ICD-10-CM

## 2021-02-10 DIAGNOSIS — G629 Polyneuropathy, unspecified: Secondary | ICD-10-CM

## 2021-02-10 DIAGNOSIS — M779 Enthesopathy, unspecified: Secondary | ICD-10-CM

## 2021-02-10 MED ORDER — TRIAMCINOLONE ACETONIDE 10 MG/ML IJ SUSP
20.0000 mg | Freq: Once | INTRAMUSCULAR | Status: AC
  Administered 2021-02-10: 20 mg

## 2021-02-10 NOTE — Progress Notes (Signed)
Subjective:   Patient ID: Douglas [name], male   DOB: 70 y.o.   MRN: [id number]   HPI Patient states has had a lot of pain left over right foot and its been hard to be active with.  Patient states he does not know what occurred its been going on for a year but is gradually gotten worse over the last few months and become more inflamed.  Patient overall is in good health and did have a pacemaker put in several weeks ago   Review of Systems  All other systems reviewed and are negative.      Objective:  Physical Exam Vitals and nursing note reviewed.  Constitutional:      Appearance: He is well-developed.  Pulmonary:     Effort: Pulmonary effort is normal.  Musculoskeletal:        General: Normal range of motion.  Skin:    General: Skin is warm.  Neurological:     Mental Status: He is alert.    Neurovascular status intact muscle strength found to be adequate range of motion adequate with patient noted to have exquisite discomfort in the left fourth metatarsal phalangeal joint and also a lot of discomfort in the sinus tarsi.  He has a fairly high arch he did have issues with heart function and had a pacemaker put in 2 weeks ago     Assessment:  May be AF inflammatory condition secondary to foot structure or it also may be related to heart function creating some venous disease with swelling and other pathology     Plan:  H&P reviewed all conditions and I Minna treat first his inflammatory condition.  I did sterile prep injected the sinus tarsi left 3 mg Kenalog 5 mg Xylocaine and I then injected the fourth MPJ periarticular 3 mg dexamethasone Kenalog 5 mg Xylocaine advised on reduced activity reappoint to recheck and may require other treatments for the future.  Also may consider orthotics  X-rays indicate relative high arch foot structure no other indications of pathology

## 2021-02-12 ENCOUNTER — Other Ambulatory Visit: Payer: Self-pay | Admitting: Podiatry

## 2021-02-12 DIAGNOSIS — M779 Enthesopathy, unspecified: Secondary | ICD-10-CM

## 2021-02-17 ENCOUNTER — Other Ambulatory Visit: Payer: Self-pay | Admitting: Oncology

## 2021-02-17 DIAGNOSIS — C9110 Chronic lymphocytic leukemia of B-cell type not having achieved remission: Secondary | ICD-10-CM

## 2021-03-11 ENCOUNTER — Ambulatory Visit (INDEPENDENT_AMBULATORY_CARE_PROVIDER_SITE_OTHER): Payer: 59 | Admitting: Podiatry

## 2021-03-11 ENCOUNTER — Other Ambulatory Visit: Payer: Self-pay

## 2021-03-11 ENCOUNTER — Encounter: Payer: Self-pay | Admitting: Podiatry

## 2021-03-11 DIAGNOSIS — M7751 Other enthesopathy of right foot: Secondary | ICD-10-CM

## 2021-03-11 DIAGNOSIS — M7752 Other enthesopathy of left foot: Secondary | ICD-10-CM

## 2021-03-11 MED ORDER — TRIAMCINOLONE ACETONIDE 10 MG/ML IJ SUSP
10.0000 mg | Freq: Once | INTRAMUSCULAR | Status: AC
  Administered 2021-03-11: 10 mg

## 2021-03-11 NOTE — Progress Notes (Signed)
Subjective:   Patient ID: Douglas [name], male   DOB: 70 y.o.   MRN: [id number]   HPI Patient states the left foot is feeling quite a bit better but still having swelling around the joint of the right foot.  States therapy left foot has improved in both the ankle and the forefoot   ROS      Objective:  Physical Exam  Neurovascular status intact with patient's right foot showing inflammation around the fourth MPJ with mild swelling in the left foot dramatically improved from several weeks ago     Assessment:  Improvement left foot with inflammatory capsulitis fourth MPJ right     Plan:  H&P reviewed condition sterile prep and did do a periarticular injection of the fourth MPJ 3 mg dexamethasone Kenalog 5 mg Xylocaine advised on rigid bottom shoes reappoint to recheck as needed

## 2021-04-19 ENCOUNTER — Other Ambulatory Visit: Payer: Self-pay | Admitting: Oncology

## 2021-04-19 DIAGNOSIS — C9110 Chronic lymphocytic leukemia of B-cell type not having achieved remission: Secondary | ICD-10-CM

## 2021-04-20 ENCOUNTER — Ambulatory Visit (INDEPENDENT_AMBULATORY_CARE_PROVIDER_SITE_OTHER): Payer: 59

## 2021-04-20 DIAGNOSIS — I441 Atrioventricular block, second degree: Secondary | ICD-10-CM

## 2021-04-20 LAB — CUP PACEART REMOTE DEVICE CHECK
Battery Remaining Longevity: 150 mo
Battery Voltage: 3.19 V
Brady Statistic AP VP Percent: 14.34 %
Brady Statistic AP VS Percent: 0.01 %
Brady Statistic AS VP Percent: 83.55 %
Brady Statistic AS VS Percent: 2.11 %
Brady Statistic RA Percent Paced: 15.13 %
Brady Statistic RV Percent Paced: 97.89 %
Date Time Interrogation Session: 20230109212602
Implantable Lead Implant Date: 20221011
Implantable Lead Implant Date: 20221011
Implantable Lead Location: 753859
Implantable Lead Location: 753860
Implantable Lead Model: 3830
Implantable Lead Model: 5076
Implantable Pulse Generator Implant Date: 20221011
Lead Channel Impedance Value: 342 Ohm
Lead Channel Impedance Value: 361 Ohm
Lead Channel Impedance Value: 475 Ohm
Lead Channel Impedance Value: 589 Ohm
Lead Channel Pacing Threshold Amplitude: 0.625 V
Lead Channel Pacing Threshold Amplitude: 0.875 V
Lead Channel Pacing Threshold Pulse Width: 0.4 ms
Lead Channel Pacing Threshold Pulse Width: 0.4 ms
Lead Channel Sensing Intrinsic Amplitude: 13.375 mV
Lead Channel Sensing Intrinsic Amplitude: 13.375 mV
Lead Channel Sensing Intrinsic Amplitude: 3.875 mV
Lead Channel Sensing Intrinsic Amplitude: 3.875 mV
Lead Channel Setting Pacing Amplitude: 1.75 V
Lead Channel Setting Pacing Amplitude: 2 V
Lead Channel Setting Pacing Pulse Width: 0.4 ms
Lead Channel Setting Sensing Sensitivity: 0.9 mV

## 2021-04-28 ENCOUNTER — Ambulatory Visit: Payer: 59 | Admitting: Oncology

## 2021-04-28 ENCOUNTER — Other Ambulatory Visit: Payer: 59

## 2021-04-29 ENCOUNTER — Encounter: Payer: 59 | Admitting: Internal Medicine

## 2021-04-30 ENCOUNTER — Encounter (INDEPENDENT_AMBULATORY_CARE_PROVIDER_SITE_OTHER): Payer: Self-pay

## 2021-04-30 ENCOUNTER — Ambulatory Visit (INDEPENDENT_AMBULATORY_CARE_PROVIDER_SITE_OTHER): Payer: 59 | Admitting: Internal Medicine

## 2021-04-30 ENCOUNTER — Other Ambulatory Visit: Payer: Self-pay

## 2021-04-30 VITALS — BP 142/70 | HR 82 | Ht 72.0 in | Wt 240.2 lb

## 2021-04-30 DIAGNOSIS — I441 Atrioventricular block, second degree: Secondary | ICD-10-CM

## 2021-04-30 DIAGNOSIS — Z950 Presence of cardiac pacemaker: Secondary | ICD-10-CM | POA: Diagnosis not present

## 2021-04-30 LAB — CUP PACEART INCLINIC DEVICE CHECK
Battery Remaining Longevity: 151 mo
Battery Voltage: 3.19 V
Brady Statistic AP VP Percent: 13.43 %
Brady Statistic AP VS Percent: 0.01 %
Brady Statistic AS VP Percent: 84.36 %
Brady Statistic AS VS Percent: 2.2 %
Brady Statistic RA Percent Paced: 14.27 %
Brady Statistic RV Percent Paced: 97.79 %
Date Time Interrogation Session: 20230120101251
Implantable Lead Implant Date: 20221011
Implantable Lead Implant Date: 20221011
Implantable Lead Location: 753859
Implantable Lead Location: 753860
Implantable Lead Model: 3830
Implantable Lead Model: 5076
Implantable Pulse Generator Implant Date: 20221011
Lead Channel Impedance Value: 342 Ohm
Lead Channel Impedance Value: 380 Ohm
Lead Channel Impedance Value: 513 Ohm
Lead Channel Impedance Value: 532 Ohm
Lead Channel Pacing Threshold Amplitude: 0.75 V
Lead Channel Pacing Threshold Amplitude: 0.75 V
Lead Channel Pacing Threshold Pulse Width: 0.4 ms
Lead Channel Pacing Threshold Pulse Width: 0.4 ms
Lead Channel Sensing Intrinsic Amplitude: 3.625 mV
Lead Channel Sensing Intrinsic Amplitude: 3.75 mV
Lead Channel Sensing Intrinsic Amplitude: 6.375 mV
Lead Channel Sensing Intrinsic Amplitude: 7.375 mV
Lead Channel Setting Pacing Amplitude: 1.5 V
Lead Channel Setting Pacing Amplitude: 2 V
Lead Channel Setting Pacing Pulse Width: 0.4 ms
Lead Channel Setting Sensing Sensitivity: 0.9 mV

## 2021-04-30 NOTE — Patient Instructions (Signed)
Medication Instructions:  °Your physician recommends that you continue on your current medications as directed. Please refer to the Current Medication list given to you today. ° °Labwork: °None ordered. ° °Testing/Procedures: °None ordered. ° °Follow-Up: °Your physician wants you to follow-up in: one year with Gregg Taylor, MD or one of the following Advanced Practice Providers on your designated Care Team:   °Renee Ursuy, PA-C °Michael "Andy" Tillery, PA-C ° °Remote monitoring is used to monitor your Pacemaker from home. This monitoring reduces the number of office visits required to check your device to one time per year. It allows us to keep an eye on the functioning of your device to ensure it is working properly. You are scheduled for a device check from home on 07/20/2021. You may send your transmission at any time that day. If you have a wireless device, the transmission will be sent automatically. After your physician reviews your transmission, you will receive a postcard with your next transmission date. ° °Any Other Special Instructions Will Be Listed Below (If Applicable). ° °If you need a refill on your cardiac medications before your next appointment, please call your pharmacy.  ° ° ° ° °

## 2021-04-30 NOTE — Progress Notes (Signed)
HPI Douglas [name] returns today for followup. He is a pleasant 71 yo man with a h/o CLL and conduction system disease who underwent insertion of a DDD PM 3 months ago after experiencing presyncope. In the interim he notes that he has gained.  No Known Allergies   Current Outpatient Medications  Medication Sig Dispense Refill   albuterol (PROVENTIL HFA;VENTOLIN HFA) 108 (90 BASE) MCG/ACT inhaler Inhale 2 puffs into the lungs 4 (four) times daily. (Patient taking differently: Inhale 2 puffs into the lungs every 6 (six) hours as needed for shortness of breath or wheezing.) 1 Inhaler 0   amLODipine (NORVASC) 5 MG tablet Take 5 mg by mouth daily.     azelastine (ASTELIN) 0.1 % nasal spray Place 1 spray into both nostrils as needed for rhinitis. Use in each nostril as directed     IMBRUVICA 420 MG TABS TAKE 1 TABLET BY MOUTH ONCE  DAILY WITH A FULL GLASS OF WATER 28 tablet 1   loratadine (CLARITIN) 10 MG tablet Take 10 mg by mouth daily as needed.      montelukast (SINGULAIR) 10 MG tablet Take 10 mg by mouth as needed.     terazosin (HYTRIN) 10 MG capsule Take 10 mg by mouth at bedtime.     valsartan-hydrochlorothiazide (DIOVAN-HCT) 320-25 MG tablet Take 1 tablet by mouth daily.     No current facility-administered medications for this visit.     Past Medical History:  Diagnosis Date   BPH (benign prostatic hyperplasia)    CLL (chronic lymphocytic leukemia) ([place])    Hypertension     ROS:   All systems reviewed and negative except as noted in the HPI.   Past Surgical History:  Procedure Laterality Date   LEFT HEART CATH AND CORONARY ANGIOGRAPHY N/A 01/18/2021   Procedure: LEFT HEART CATH AND CORONARY ANGIOGRAPHY;  Surgeon: Burnell Blanks, MD;  Location: [place] [facility];  Service: Cardiovascular;  Laterality: N/A;   PACEMAKER IMPLANT N/A 01/19/2021   Procedure: PACEMAKER IMPLANT;  Surgeon: Evans Lance, MD;  Location: [place] [facility];  Service: Cardiovascular;   Laterality: N/A;     Family History  Problem Relation Age of Onset   Lung cancer Mother    Coronary artery disease Father      Social History   Socioeconomic History   Marital status: Married    Spouse name: Not on file   Number of children: Not on file   Years of education: Not on file   Highest education level: Not on file  Occupational History   Not on file  Tobacco Use   Smoking status: Former   Smokeless tobacco: Never  Substance and Sexual Activity   Alcohol use: Not on file   Drug use: Not on file   Sexual activity: Not on file  Other Topics Concern   Not on file  Social History Narrative   Not on file   Social Determinants of Health   Financial Resource Strain: Not on file  Food Insecurity: Not on file  Transportation Needs: Not on file  Physical Activity: Not on file  Stress: Not on file  Social Connections: Not on file  Intimate Partner Violence: Not on file     BP (!) 142/70    Pulse 82    Ht 6' (1.829 m)    Wt 240 lb 3.2 oz (109 kg)    SpO2 96%    BMI 32.58 kg/m   Physical Exam:  Well  appearing NAD HEENT: Unremarkable Neck:  No JVD, no thyromegally Lymphatics:  No adenopathy Back:  No CVA tenderness Lungs:  Clear with no wheezes HEART:  Regular rate rhythm, no murmurs, no rubs, no clicks Abd:  soft, positive bowel sounds, no organomegally, no rebound, no guarding Ext:  2 plus pulses, no edema, no cyanosis, no clubbing Skin:  No rashes no nodules Neuro:  CN II through XII intact, motor grossly intact  EKG - nsr with pacing induced LBBB  DEVICE  Normal device function.  See PaceArt for details.   Assess/Plan:  High grade heart block - he is doing well s/p PPM insertion. No syncope. PPM -his medtronic DDD PPM is working normally.  HTN - he will continue his current meds.  Weight gain - we discussed the importance of weight loss.  Douglas Overlie Atticus Lemberger,MD

## 2021-04-30 NOTE — Progress Notes (Signed)
Remote pacemaker transmission.   

## 2021-05-04 ENCOUNTER — Inpatient Hospital Stay (HOSPITAL_BASED_OUTPATIENT_CLINIC_OR_DEPARTMENT_OTHER): Payer: 59 | Admitting: Oncology

## 2021-05-04 ENCOUNTER — Other Ambulatory Visit: Payer: Self-pay

## 2021-05-04 ENCOUNTER — Inpatient Hospital Stay: Payer: 59 | Attending: Oncology

## 2021-05-04 VITALS — BP 167/75 | HR 84 | Temp 98.1°F | Resp 18 | Ht 72.0 in | Wt 241.8 lb

## 2021-05-04 DIAGNOSIS — C9110 Chronic lymphocytic leukemia of B-cell type not having achieved remission: Secondary | ICD-10-CM

## 2021-05-04 LAB — CMP (CANCER CENTER ONLY)
ALT: 28 U/L (ref 0–44)
AST: 18 U/L (ref 15–41)
Albumin: 4 g/dL (ref 3.5–5.0)
Alkaline Phosphatase: 93 U/L (ref 38–126)
Anion gap: 9 (ref 5–15)
BUN: 23 mg/dL (ref 8–23)
CO2: 31 mmol/L (ref 22–32)
Calcium: 9.3 mg/dL (ref 8.9–10.3)
Chloride: 99 mmol/L (ref 98–111)
Creatinine: 0.94 mg/dL (ref 0.61–1.24)
GFR, Estimated: >60 mL/min (ref >60)
Glucose, Bld: 266 mg/dL — ABNORMAL HIGH (ref 70–99)
Potassium: 4.1 mmol/L (ref 3.5–5.1)
Sodium: 139 mmol/L (ref 135–145)
Total Bilirubin: 0.6 mg/dL (ref 0.3–1.2)
Total Protein: 6.5 g/dL (ref 6.5–8.1)

## 2021-05-04 LAB — CBC WITH DIFFERENTIAL (CANCER CENTER ONLY)
Abs Immature Granulocytes: 0.08 10*3/uL — ABNORMAL HIGH (ref 0.00–0.07)
Basophils Absolute: 0.1 10*3/uL (ref 0.0–0.1)
Basophils Relative: 1 %
Eosinophils Absolute: 0.1 10*3/uL (ref 0.0–0.5)
Eosinophils Relative: 1 %
HCT: 45.2 % (ref 39.0–52.0)
Hemoglobin: 14.9 g/dL (ref 13.0–17.0)
Immature Granulocytes: 1 %
Lymphocytes Relative: 33 %
Lymphs Abs: 2.9 10*3/uL (ref 0.7–4.0)
MCH: 30.5 pg (ref 26.0–34.0)
MCHC: 33 g/dL (ref 30.0–36.0)
MCV: 92.4 fL (ref 80.0–100.0)
Monocytes Absolute: 0.3 10*3/uL (ref 0.1–1.0)
Monocytes Relative: 4 %
Neutro Abs: 5.2 10*3/uL (ref 1.7–7.7)
Neutrophils Relative %: 60 %
Platelet Count: 352 10*3/uL (ref 150–400)
RBC: 4.89 MIL/uL (ref 4.22–5.81)
RDW: 15.2 % (ref 11.5–15.5)
WBC Count: 8.6 10*3/uL (ref 4.0–10.5)
nRBC: 0 % (ref 0.0–0.2)

## 2021-05-04 NOTE — Progress Notes (Signed)
Hematology and Oncology Follow Up Visit  Douglas [name] [id number] 1950-09-07 71 y.o. 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    Current therapy: Ibrutinib 420 mg started in August 2021.  Interim History: [name] returns today for a follow-up visit.  Since last visit, he reports feeling well without any major complaints.  He continues to tolerate ibrutinib without any recent issues.  He denies any fatigue, weakness or palpitation.  He denies any hospitalizations or illnesses.  His performance status quality of life remains unchanged.  He denies any palpitation or chest pain.           Medications: Updated on review. Current Outpatient Medications  Medication Sig Dispense Refill   albuterol (PROVENTIL HFA;VENTOLIN HFA) 108 (90 BASE) MCG/ACT inhaler Inhale 2 puffs into the lungs 4 (four) times daily. (Patient taking differently: Inhale 2 puffs into the lungs every 6 (six) hours as needed for shortness of breath or wheezing.) 1 Inhaler 0   amLODipine (NORVASC) 5 MG tablet Take 5 mg by mouth daily.     azelastine (ASTELIN) 0.1 % nasal spray Place 1 spray into both nostrils as needed for rhinitis. Use in each nostril as directed     IMBRUVICA 420 MG TABS TAKE 1 TABLET BY MOUTH ONCE  DAILY WITH A FULL GLASS OF WATER 28 tablet 1   loratadine (CLARITIN) 10 MG tablet Take 10 mg by mouth daily as needed.      montelukast (SINGULAIR) 10 MG tablet Take 10 mg by mouth as needed.     terazosin (HYTRIN) 10 MG capsule Take 10 mg by mouth at bedtime.     valsartan-hydrochlorothiazide (DIOVAN-HCT) 320-25 MG tablet Take 1 tablet by mouth daily.     No current facility-administered medications for this visit.     Allergies: No Known Allergies      Physical Exam:     Blood pressure (!) 167/75, pulse 84, temperature 98.1 F (36.7 C), temperature source Tympanic, resp.  rate 18, height 6' (1.829 m), weight 241 lb 12.8 oz (109.7 kg), SpO2 98 %.    ECOG: 0     General appearance: Alert, awake without any distress. Head: Atraumatic without abnormalities Oropharynx: Without any thrush or ulcers. Eyes: No scleral icterus. Lymph nodes: No lymphadenopathy noted in the cervical, supraclavicular, or axillary nodes Heart:regular rate and rhythm, without any murmurs or gallops.   Lung: Clear to auscultation without any rhonchi, wheezes or dullness to percussion. Abdomin: Soft, nontender without any shifting dullness or ascites. Musculoskeletal: No clubbing or cyanosis. Neurological: No motor or sensory deficits. Skin: No rashes or lesions.            Lab Results: Lab Results  Component Value Date   WBC 12.1 (H) 01/27/2021   HGB 15.7 01/27/2021   HCT 47.6 01/27/2021   MCV 91.7 01/27/2021   PLT 308 01/27/2021     Chemistry      Component Value Date/Time   NA 139 01/27/2021 0742   K 4.0 01/27/2021 0742   CL 98 01/27/2021 0742   CO2 27 01/27/2021 0742   BUN 21 01/27/2021 0742   CREATININE 0.93 01/27/2021 0742      Component Value Date/Time   CALCIUM 9.6 01/27/2021 0742   ALKPHOS 100 01/27/2021 0742   AST 19 01/27/2021 0742   ALT 59 (H) 01/27/2021 0742   BILITOT 0.6 01/27/2021 0177  Impression and Plan:   72 year old with:  1.  CLL diagnosed in August 2020.  He presented with stage I disease and ATM mutation.   He experienced clinical and hematological response to ibrutinib which she has tolerated very well.  Risks and benefits of continuing this treatment were reviewed at this time.  Potential complications that include cardiac issues, palpitation, arrhythmia and bleeding.  Alternative treatment options including Calquence or chemotherapy.  CBC personally reviewed today and showed normal counts at this time including normalization of his lymphocytosis and hematological parameters.   After discussion, he is agreeable to  continue for the time being.  2.  Cardiac arrhythmia: No recent cardiac complications noted.     3.  Follow-up: In 3 months for repeat follow-up.   30  minutes were spent on this encounter.  The time was dedicated to reviewing laboratory data, disease status update, addressing complications related to his condition and treatment.    [name], MD 1/24/20238:01 AM

## 2021-05-28 ENCOUNTER — Encounter: Payer: Self-pay | Admitting: Podiatry

## 2021-05-28 ENCOUNTER — Other Ambulatory Visit: Payer: Self-pay

## 2021-05-28 ENCOUNTER — Ambulatory Visit (INDEPENDENT_AMBULATORY_CARE_PROVIDER_SITE_OTHER): Payer: 59 | Admitting: Podiatry

## 2021-05-28 DIAGNOSIS — M722 Plantar fascial fibromatosis: Secondary | ICD-10-CM | POA: Diagnosis not present

## 2021-05-28 DIAGNOSIS — M79672 Pain in left foot: Secondary | ICD-10-CM

## 2021-05-28 DIAGNOSIS — M79671 Pain in right foot: Secondary | ICD-10-CM

## 2021-05-28 DIAGNOSIS — M7751 Other enthesopathy of right foot: Secondary | ICD-10-CM

## 2021-05-28 MED ORDER — TRIAMCINOLONE ACETONIDE 10 MG/ML IJ SUSP
10.0000 mg | Freq: Once | INTRAMUSCULAR | Status: AC
  Administered 2021-05-28: 10 mg

## 2021-05-29 NOTE — Progress Notes (Signed)
Subjective:   Patient ID: Douglas [name], male   DOB: 71 y.o.   MRN: [id number]   HPI Patient presents stating that he is getting a lot of pain in his right heel and his right ankle and into his Achilles area.  States that he does not feel like he is walking normally and that it is very difficult for him to weight-bear and states the plantar is the worst at the current time   ROS      Objective:  Physical Exam  Neurovascular status intact with patient found to have exquisite discomfort plantar aspect right heel at the insertional point tendon calcaneus with inflammation fluid with discomfort also into the sinus tarsi Achilles and peroneal group     Assessment:  Chronic fasciitis that is acute in its nature currently along with compensatory pains numerous portions of his foot     Plan:  H&P reviewed condition and I did go ahead today and did sterile prep and injected the medial band of the fascia 3 mg Kenalog 5 mill of Xylocaine.  I then went ahead and I have advised him on air fracture walker to try to completely immobilize reduce all plantar strain and the lateral and posterior strain.  Patient will be seen back 3 weeks was encouraged to try to wear it as much as possible over that time

## 2021-06-08 ENCOUNTER — Other Ambulatory Visit (HOSPITAL_COMMUNITY): Payer: Self-pay

## 2021-06-14 ENCOUNTER — Other Ambulatory Visit: Payer: Self-pay | Admitting: Oncology

## 2021-06-14 DIAGNOSIS — C9110 Chronic lymphocytic leukemia of B-cell type not having achieved remission: Secondary | ICD-10-CM

## 2021-06-18 ENCOUNTER — Inpatient Hospital Stay (HOSPITAL_COMMUNITY)
Admission: EM | Admit: 2021-06-18 | Discharge: 2021-07-01 | DRG: 240 | Disposition: A | Discharge status: Rehabilitation Facility | Payer: 59 | Attending: Internal Medicine | Admitting: Internal Medicine

## 2021-06-18 ENCOUNTER — Inpatient Hospital Stay (HOSPITAL_COMMUNITY): Payer: 59

## 2021-06-18 ENCOUNTER — Ambulatory Visit (INDEPENDENT_AMBULATORY_CARE_PROVIDER_SITE_OTHER): Payer: 59 | Admitting: Podiatry

## 2021-06-18 ENCOUNTER — Other Ambulatory Visit: Payer: Self-pay

## 2021-06-18 ENCOUNTER — Ambulatory Visit (INDEPENDENT_AMBULATORY_CARE_PROVIDER_SITE_OTHER): Payer: 59

## 2021-06-18 ENCOUNTER — Encounter (HOSPITAL_COMMUNITY): Payer: Self-pay | Admitting: Emergency Medicine

## 2021-06-18 ENCOUNTER — Encounter: Payer: Self-pay | Admitting: Podiatry

## 2021-06-18 DIAGNOSIS — R112 Nausea with vomiting, unspecified: Secondary | ICD-10-CM | POA: Diagnosis not present

## 2021-06-18 DIAGNOSIS — I2510 Atherosclerotic heart disease of native coronary artery without angina pectoris: Secondary | ICD-10-CM | POA: Diagnosis present

## 2021-06-18 DIAGNOSIS — Z89511 Acquired absence of right leg below knee: Secondary | ICD-10-CM | POA: Diagnosis not present

## 2021-06-18 DIAGNOSIS — I459 Conduction disorder, unspecified: Secondary | ICD-10-CM | POA: Diagnosis present

## 2021-06-18 DIAGNOSIS — Y835 Amputation of limb(s) as the cause of abnormal reaction of the patient, or of later complication, without mention of misadventure at the time of the procedure: Secondary | ICD-10-CM | POA: Diagnosis not present

## 2021-06-18 DIAGNOSIS — E669 Obesity, unspecified: Secondary | ICD-10-CM | POA: Diagnosis present

## 2021-06-18 DIAGNOSIS — Z8249 Family history of ischemic heart disease and other diseases of the circulatory system: Secondary | ICD-10-CM | POA: Diagnosis not present

## 2021-06-18 DIAGNOSIS — E1151 Type 2 diabetes mellitus with diabetic peripheral angiopathy without gangrene: Principal | ICD-10-CM | POA: Diagnosis present

## 2021-06-18 DIAGNOSIS — R5381 Other malaise: Secondary | ICD-10-CM

## 2021-06-18 DIAGNOSIS — Z79899 Other long term (current) drug therapy: Secondary | ICD-10-CM

## 2021-06-18 DIAGNOSIS — Z87891 Personal history of nicotine dependence: Secondary | ICD-10-CM

## 2021-06-18 DIAGNOSIS — I252 Old myocardial infarction: Secondary | ICD-10-CM

## 2021-06-18 DIAGNOSIS — G8918 Other acute postprocedural pain: Secondary | ICD-10-CM | POA: Diagnosis not present

## 2021-06-18 DIAGNOSIS — I472 Ventricular tachycardia, unspecified: Secondary | ICD-10-CM | POA: Diagnosis present

## 2021-06-18 DIAGNOSIS — E440 Moderate protein-calorie malnutrition: Secondary | ICD-10-CM | POA: Insufficient documentation

## 2021-06-18 DIAGNOSIS — R338 Other retention of urine: Secondary | ICD-10-CM | POA: Diagnosis present

## 2021-06-18 DIAGNOSIS — C9110 Chronic lymphocytic leukemia of B-cell type not having achieved remission: Secondary | ICD-10-CM | POA: Diagnosis present

## 2021-06-18 DIAGNOSIS — R109 Unspecified abdominal pain: Secondary | ICD-10-CM

## 2021-06-18 DIAGNOSIS — M7751 Other enthesopathy of right foot: Secondary | ICD-10-CM

## 2021-06-18 DIAGNOSIS — R7401 Elevation of levels of liver transaminase levels: Secondary | ICD-10-CM | POA: Diagnosis not present

## 2021-06-18 DIAGNOSIS — N401 Enlarged prostate with lower urinary tract symptoms: Secondary | ICD-10-CM | POA: Diagnosis present

## 2021-06-18 DIAGNOSIS — E871 Hypo-osmolality and hyponatremia: Secondary | ICD-10-CM | POA: Diagnosis present

## 2021-06-18 DIAGNOSIS — I70223 Atherosclerosis of native arteries of extremities with rest pain, bilateral legs: Secondary | ICD-10-CM | POA: Diagnosis not present

## 2021-06-18 DIAGNOSIS — I70221 Atherosclerosis of native arteries of extremities with rest pain, right leg: Secondary | ICD-10-CM | POA: Diagnosis not present

## 2021-06-18 DIAGNOSIS — I70211 Atherosclerosis of native arteries of extremities with intermittent claudication, right leg: Secondary | ICD-10-CM | POA: Diagnosis not present

## 2021-06-18 DIAGNOSIS — E785 Hyperlipidemia, unspecified: Secondary | ICD-10-CM | POA: Diagnosis present

## 2021-06-18 DIAGNOSIS — R54 Age-related physical debility: Secondary | ICD-10-CM | POA: Diagnosis not present

## 2021-06-18 DIAGNOSIS — Z7982 Long term (current) use of aspirin: Secondary | ICD-10-CM | POA: Diagnosis not present

## 2021-06-18 DIAGNOSIS — I10 Essential (primary) hypertension: Secondary | ICD-10-CM | POA: Diagnosis present

## 2021-06-18 DIAGNOSIS — I999 Unspecified disorder of circulatory system: Secondary | ICD-10-CM | POA: Diagnosis not present

## 2021-06-18 DIAGNOSIS — E876 Hypokalemia: Secondary | ICD-10-CM

## 2021-06-18 DIAGNOSIS — E119 Type 2 diabetes mellitus without complications: Secondary | ICD-10-CM

## 2021-06-18 DIAGNOSIS — G546 Phantom limb syndrome with pain: Secondary | ICD-10-CM | POA: Diagnosis present

## 2021-06-18 DIAGNOSIS — N390 Urinary tract infection, site not specified: Secondary | ICD-10-CM | POA: Diagnosis not present

## 2021-06-18 DIAGNOSIS — E1169 Type 2 diabetes mellitus with other specified complication: Secondary | ICD-10-CM

## 2021-06-18 DIAGNOSIS — M722 Plantar fascial fibromatosis: Secondary | ICD-10-CM | POA: Diagnosis present

## 2021-06-18 DIAGNOSIS — Z20822 Contact with and (suspected) exposure to covid-19: Secondary | ICD-10-CM | POA: Diagnosis present

## 2021-06-18 DIAGNOSIS — Z6830 Body mass index (BMI) 30.0-30.9, adult: Secondary | ICD-10-CM

## 2021-06-18 DIAGNOSIS — R001 Bradycardia, unspecified: Secondary | ICD-10-CM | POA: Diagnosis not present

## 2021-06-18 DIAGNOSIS — K5900 Constipation, unspecified: Secondary | ICD-10-CM | POA: Diagnosis present

## 2021-06-18 DIAGNOSIS — Z950 Presence of cardiac pacemaker: Secondary | ICD-10-CM | POA: Diagnosis present

## 2021-06-18 DIAGNOSIS — I739 Peripheral vascular disease, unspecified: Secondary | ICD-10-CM

## 2021-06-18 DIAGNOSIS — E1165 Type 2 diabetes mellitus with hyperglycemia: Secondary | ICD-10-CM | POA: Diagnosis not present

## 2021-06-18 DIAGNOSIS — G4700 Insomnia, unspecified: Secondary | ICD-10-CM | POA: Diagnosis not present

## 2021-06-18 DIAGNOSIS — N400 Enlarged prostate without lower urinary tract symptoms: Secondary | ICD-10-CM | POA: Diagnosis present

## 2021-06-18 DIAGNOSIS — Z801 Family history of malignant neoplasm of trachea, bronchus and lung: Secondary | ICD-10-CM | POA: Diagnosis not present

## 2021-06-18 DIAGNOSIS — I471 Supraventricular tachycardia: Secondary | ICD-10-CM | POA: Diagnosis not present

## 2021-06-18 DIAGNOSIS — I998 Other disorder of circulatory system: Secondary | ICD-10-CM | POA: Diagnosis not present

## 2021-06-18 DIAGNOSIS — M7989 Other specified soft tissue disorders: Secondary | ICD-10-CM | POA: Diagnosis present

## 2021-06-18 DIAGNOSIS — K920 Hematemesis: Secondary | ICD-10-CM

## 2021-06-18 DIAGNOSIS — D62 Acute posthemorrhagic anemia: Secondary | ICD-10-CM | POA: Diagnosis present

## 2021-06-18 DIAGNOSIS — Z4781 Encounter for orthopedic aftercare following surgical amputation: Secondary | ICD-10-CM | POA: Diagnosis present

## 2021-06-18 HISTORY — DX: Presence of cardiac pacemaker: Z95.0

## 2021-06-18 LAB — PREALBUMIN: Prealbumin: 25.3 mg/dL (ref 18–38)

## 2021-06-18 LAB — CBC WITH DIFFERENTIAL/PLATELET
Abs Immature Granulocytes: 0.09 10*3/uL — ABNORMAL HIGH (ref 0.00–0.07)
Basophils Absolute: 0.1 10*3/uL (ref 0.0–0.1)
Basophils Relative: 1 %
Eosinophils Absolute: 0.1 10*3/uL (ref 0.0–0.5)
Eosinophils Relative: 1 %
HCT: 47.8 % (ref 39.0–52.0)
Hemoglobin: 15.7 g/dL (ref 13.0–17.0)
Immature Granulocytes: 1 %
Lymphocytes Relative: 20 %
Lymphs Abs: 2.2 10*3/uL (ref 0.7–4.0)
MCH: 29.6 pg (ref 26.0–34.0)
MCHC: 32.8 g/dL (ref 30.0–36.0)
MCV: 90 fL (ref 80.0–100.0)
Monocytes Absolute: 0.5 10*3/uL (ref 0.1–1.0)
Monocytes Relative: 4 %
Neutro Abs: 8.4 10*3/uL — ABNORMAL HIGH (ref 1.7–7.7)
Neutrophils Relative %: 73 %
Platelets: 540 10*3/uL — ABNORMAL HIGH (ref 150–400)
RBC: 5.31 MIL/uL (ref 4.22–5.81)
RDW: 15.4 % (ref 11.5–15.5)
WBC: 11.3 10*3/uL — ABNORMAL HIGH (ref 4.0–10.5)
nRBC: 0 % (ref 0.0–0.2)

## 2021-06-18 LAB — COMPREHENSIVE METABOLIC PANEL
ALT: 51 U/L — ABNORMAL HIGH (ref 0–44)
AST: 23 U/L (ref 15–41)
Albumin: 3.7 g/dL (ref 3.5–5.0)
Alkaline Phosphatase: 109 U/L (ref 38–126)
Anion gap: 11 (ref 5–15)
BUN: 22 mg/dL (ref 8–23)
CO2: 27 mmol/L (ref 22–32)
Calcium: 8.9 mg/dL (ref 8.9–10.3)
Chloride: 98 mmol/L (ref 98–111)
Creatinine, Ser: 0.88 mg/dL (ref 0.61–1.24)
GFR, Estimated: >60 mL/min (ref >60)
Glucose, Bld: 135 mg/dL — ABNORMAL HIGH (ref 70–99)
Potassium: 3.8 mmol/L (ref 3.5–5.1)
Sodium: 136 mmol/L (ref 135–145)
Total Bilirubin: 0.8 mg/dL (ref 0.3–1.2)
Total Protein: 6.4 g/dL — ABNORMAL LOW (ref 6.5–8.1)

## 2021-06-18 LAB — RESP PANEL BY RT-PCR (FLU A&B, COVID) ARPGX2
Influenza A by PCR: NEGATIVE
Influenza B by PCR: NEGATIVE
SARS Coronavirus 2 by RT PCR: NEGATIVE

## 2021-06-18 LAB — C-REACTIVE PROTEIN: CRP: 2.1 mg/dL — ABNORMAL HIGH (ref <1.0)

## 2021-06-18 LAB — HEPARIN LEVEL (UNFRACTIONATED): Heparin Unfractionated: 0.1 IU/mL — ABNORMAL LOW (ref 0.30–0.70)

## 2021-06-18 LAB — LACTIC ACID, PLASMA: Lactic Acid, Venous: 0.9 mmol/L (ref 0.5–1.9)

## 2021-06-18 LAB — GLUCOSE, CAPILLARY
Glucose-Capillary: 126 mg/dL — ABNORMAL HIGH (ref 70–99)
Glucose-Capillary: 124 mg/dL — ABNORMAL HIGH (ref 70–99)

## 2021-06-18 LAB — SEDIMENTATION RATE: Sed Rate: 17 mm/hr — ABNORMAL HIGH (ref 0–16)

## 2021-06-18 LAB — PROTIME-INR
INR: 1 (ref 0.8–1.2)
Prothrombin Time: 13.4 seconds (ref 11.4–15.2)

## 2021-06-18 MED ORDER — INSULIN ASPART 100 UNIT/ML IJ SOLN
0.0000 [IU] | Freq: Three times a day (TID) | INTRAMUSCULAR | Status: DC
  Administered 2021-06-18: 1 [IU] via SUBCUTANEOUS
  Administered 2021-06-19: 2 [IU] via SUBCUTANEOUS
  Administered 2021-06-19 – 2021-06-20 (×5): 1 [IU] via SUBCUTANEOUS
  Administered 2021-06-21 – 2021-06-22 (×3): 2 [IU] via SUBCUTANEOUS
  Administered 2021-06-22: 3 [IU] via SUBCUTANEOUS
  Administered 2021-06-23 (×2): 2 [IU] via SUBCUTANEOUS
  Administered 2021-06-23 – 2021-06-25 (×3): 1 [IU] via SUBCUTANEOUS
  Administered 2021-06-25: 5 [IU] via SUBCUTANEOUS
  Administered 2021-06-26: 2 [IU] via SUBCUTANEOUS
  Administered 2021-06-26 – 2021-06-27 (×3): 1 [IU] via SUBCUTANEOUS
  Administered 2021-06-27: 2 [IU] via SUBCUTANEOUS
  Administered 2021-06-27: 1 [IU] via SUBCUTANEOUS
  Administered 2021-06-28 – 2021-06-29 (×3): 2 [IU] via SUBCUTANEOUS
  Administered 2021-06-29 (×2): 1 [IU] via SUBCUTANEOUS
  Administered 2021-06-30: 2 [IU] via SUBCUTANEOUS
  Administered 2021-06-30 – 2021-07-01 (×4): 1 [IU] via SUBCUTANEOUS

## 2021-06-18 MED ORDER — ACETAMINOPHEN 650 MG RE SUPP: 650.0000 mg | Freq: Four times a day (QID) | RECTAL | Status: DC | PRN

## 2021-06-18 MED ORDER — ONDANSETRON HCL 4 MG PO TABS
4.0000 mg | ORAL_TABLET | Freq: Four times a day (QID) | ORAL | Status: DC | PRN
Start: 2021-06-18 — End: 2021-07-01
  Administered 2021-06-19: 4 mg via ORAL
  Filled 2021-06-18: qty 1

## 2021-06-18 MED ORDER — INSULIN ASPART 100 UNIT/ML IJ SOLN: 0.0000 [IU] | Freq: Every day | INTRAMUSCULAR | Status: DC

## 2021-06-18 MED ORDER — HYDROCHLOROTHIAZIDE 25 MG PO TABS
25.0000 mg | ORAL_TABLET | Freq: Every day | ORAL | Status: DC
  Administered 2021-06-19 – 2021-06-29 (×9): 25 mg via ORAL
  Filled 2021-06-18 (×9): qty 1

## 2021-06-18 MED ORDER — HYDROMORPHONE HCL 1 MG/ML IJ SOLN
1.0000 mg | Freq: Once | INTRAMUSCULAR | Status: AC
  Administered 2021-06-18: 1 mg via INTRAVENOUS
  Filled 2021-06-18: qty 1

## 2021-06-18 MED ORDER — TERAZOSIN HCL 5 MG PO CAPS
10.0000 mg | ORAL_CAPSULE | Freq: Every day | ORAL | Status: DC
  Administered 2021-06-19 – 2021-06-30 (×12): 10 mg via ORAL
  Filled 2021-06-18 (×14): qty 2

## 2021-06-18 MED ORDER — MORPHINE SULFATE (PF) 2 MG/ML IV SOLN
2.0000 mg | INTRAVENOUS | Status: DC | PRN
  Administered 2021-06-18 – 2021-06-30 (×16): 2 mg via INTRAVENOUS
  Filled 2021-06-18 (×19): qty 1

## 2021-06-18 MED ORDER — OXYCODONE HCL 5 MG PO TABS
5.0000 mg | ORAL_TABLET | ORAL | Status: DC | PRN
  Administered 2021-06-18 – 2021-07-01 (×32): 5 mg via ORAL
  Filled 2021-06-18 (×36): qty 1

## 2021-06-18 MED ORDER — ONDANSETRON HCL 4 MG/2ML IJ SOLN
4.0000 mg | Freq: Four times a day (QID) | INTRAMUSCULAR | Status: DC | PRN
  Administered 2021-06-21 (×2): 4 mg via INTRAVENOUS
  Filled 2021-06-18 (×2): qty 2

## 2021-06-18 MED ORDER — ALBUTEROL SULFATE (2.5 MG/3ML) 0.083% IN NEBU: 3.0000 mL | INHALATION_SOLUTION | Freq: Four times a day (QID) | RESPIRATORY_TRACT | Status: DC | PRN

## 2021-06-18 MED ORDER — FENTANYL CITRATE PF 50 MCG/ML IJ SOSY
50.0000 ug | PREFILLED_SYRINGE | Freq: Once | INTRAMUSCULAR | Status: AC
  Administered 2021-06-18: 50 ug via INTRAVENOUS
  Filled 2021-06-18: qty 1

## 2021-06-18 MED ORDER — HYDRALAZINE HCL 20 MG/ML IJ SOLN: 5.0000 mg | INTRAMUSCULAR | Status: DC | PRN

## 2021-06-18 MED ORDER — IBRUTINIB 420 MG PO TABS
420.0000 mg | ORAL_TABLET | Freq: Every day | ORAL | Status: DC
  Administered 2021-06-19 – 2021-06-23 (×4): 420 mg via ORAL
  Filled 2021-06-18 (×5): qty 1

## 2021-06-18 MED ORDER — VALSARTAN-HYDROCHLOROTHIAZIDE 320-25 MG PO TABS: 1.0000 | ORAL_TABLET | Freq: Every day | ORAL | Status: DC

## 2021-06-18 MED ORDER — ACETAMINOPHEN 325 MG PO TABS
650.0000 mg | ORAL_TABLET | Freq: Four times a day (QID) | ORAL | Status: DC | PRN
  Administered 2021-06-18: 650 mg via ORAL
  Filled 2021-06-18 (×2): qty 2

## 2021-06-18 MED ORDER — DOCUSATE SODIUM 100 MG PO CAPS
100.0000 mg | ORAL_CAPSULE | Freq: Two times a day (BID) | ORAL | Status: DC
  Administered 2021-06-18 – 2021-07-01 (×22): 100 mg via ORAL
  Filled 2021-06-18 (×24): qty 1

## 2021-06-18 MED ORDER — HEPARIN (PORCINE) 25000 UT/250ML-% IV SOLN
2250.0000 [IU]/h | INTRAVENOUS | Status: DC
  Administered 2021-06-18: 13:00:00 1600 [IU]/h via INTRAVENOUS
  Administered 2021-06-19: 1900 [IU]/h via INTRAVENOUS
  Administered 2021-06-19: 01:00:00 1600 [IU]/h via INTRAVENOUS
  Administered 2021-06-20 – 2021-06-21 (×3): 2250 [IU]/h via INTRAVENOUS
  Filled 2021-06-18 (×6): qty 250

## 2021-06-18 MED ORDER — BISACODYL 5 MG PO TBEC
5.0000 mg | DELAYED_RELEASE_TABLET | Freq: Every day | ORAL | Status: DC | PRN
  Administered 2021-06-20 – 2021-06-28 (×2): 5 mg via ORAL
  Filled 2021-06-18 (×2): qty 1

## 2021-06-18 MED ORDER — SODIUM CHLORIDE 0.9% FLUSH
3.0000 mL | Freq: Two times a day (BID) | INTRAVENOUS | Status: DC
  Administered 2021-06-19 – 2021-07-01 (×21): 3 mL via INTRAVENOUS

## 2021-06-18 MED ORDER — IRBESARTAN 300 MG PO TABS
300.0000 mg | ORAL_TABLET | Freq: Every day | ORAL | Status: DC
  Administered 2021-06-19 – 2021-07-01 (×11): 300 mg via ORAL
  Filled 2021-06-18 (×11): qty 1

## 2021-06-18 MED ORDER — POLYETHYLENE GLYCOL 3350 17 G PO PACK: 17.0000 g | PACK | Freq: Every day | ORAL | Status: DC | PRN

## 2021-06-18 MED ORDER — HEPARIN BOLUS VIA INFUSION
5900.0000 [IU] | Freq: Once | INTRAVENOUS | Status: AC
  Administered 2021-06-18: 5900 [IU] via INTRAVENOUS
  Filled 2021-06-18: qty 5900

## 2021-06-18 MED ORDER — AMLODIPINE BESYLATE 5 MG PO TABS
5.0000 mg | ORAL_TABLET | Freq: Every day | ORAL | Status: DC
  Administered 2021-06-18 – 2021-06-28 (×9): 5 mg via ORAL
  Filled 2021-06-18 (×10): qty 1

## 2021-06-18 MED ORDER — MONTELUKAST SODIUM 10 MG PO TABS: 10.0000 mg | ORAL_TABLET | Freq: Every day | ORAL | Status: DC | PRN

## 2021-06-18 NOTE — H&P (Signed)
History and Physical    Patient: Douglas [name] QBH:[id number] DOB: 14-Apr-1950 DOA: 06/18/2021 DOS: the patient was seen and examined on 06/18/2021 PCP: [name], MD  Patient coming from: Home - lives with wife; NOK: Wife, Douglas [name], [phone]   Chief Complaint: Foot swelling  HPI: Douglas [name] is a 71 y.o. male with medical history significant of BPH; CLL; and HTN presenting with foot swelling.  He has been treated for plantar fasciitis and a metatarsal condition with podiatry.  2 weeks ago, he noticed foot swelling, purple discoloration, and pain.  It has been extremely painful.  It is cool to the touch.  OHe can hardly walk on it, feels like walking on gravel.  He has a pacemaker since last October.  No other h/o dysrhythmia.      ER Course:  Progressive claudication of R foot.  Started on heparin drip.  Dr. Carlis Abbott consulting - angiogram Monday and possible intervention then.  ABI today, otherwise no plans for the weekend.     Review of Systems: As mentioned in the history of present illness. All other systems reviewed and are negative. Past Medical History:  Diagnosis Date   BPH (benign prostatic hyperplasia)    CLL (chronic lymphocytic leukemia) ([place])    Hypertension    Pacemaker    Past Surgical History:  Procedure Laterality Date   LEFT HEART CATH AND CORONARY ANGIOGRAPHY N/A 01/18/2021   Procedure: LEFT HEART CATH AND CORONARY ANGIOGRAPHY;  Surgeon: Burnell Blanks, MD;  Location: [facility];  Service: Cardiovascular;  Laterality: N/A;   PACEMAKER IMPLANT N/A 01/19/2021   Procedure: PACEMAKER IMPLANT;  Surgeon: Evans Lance, MD;  Location: [facility];  Service: Cardiovascular;  Laterality: N/A;   Social History:  reports that he has quit smoking. His smoking use included cigarettes. He has a 5.00 pack-year smoking history. He has never used smokeless tobacco. He reports that he does not currently use alcohol. He reports that he  does not use drugs.  No Known Allergies  Family History  Problem Relation Age of Onset   Lung cancer Mother    Coronary artery disease Father     Prior to Admission medications   Medication Sig Start Date End Date Taking? Authorizing Provider  albuterol (PROVENTIL HFA;VENTOLIN HFA) 108 (90 BASE) MCG/ACT inhaler Inhale 2 puffs into the lungs 4 (four) times daily. Patient taking differently: Inhale 2 puffs into the lungs every 6 (six) hours as needed for shortness of breath or wheezing. 07/18/13   Harden Mo, MD  amLODipine (NORVASC) 5 MG tablet Take 5 mg by mouth daily.    [provider]  azelastine (ASTELIN) 0.1 % nasal spray Place 1 spray into both nostrils as needed for rhinitis. Use in each nostril as directed    [provider]  IMBRUVICA 420 MG tablet TAKE 1 TABLET BY MOUTH ONCE  DAILY WITH A FULL GLASS OF WATER 06/14/21   Wyatt Portela, MD  loratadine (CLARITIN) 10 MG tablet Take 10 mg by mouth daily as needed.     [provider]  montelukast (SINGULAIR) 10 MG tablet Take 10 mg by mouth as needed.    [provider]  terazosin (HYTRIN) 10 MG capsule Take 10 mg by mouth at bedtime.    [provider]  valsartan-hydrochlorothiazide (DIOVAN-HCT) 320-25 MG tablet Take 1 tablet by mouth daily.    [provider]    Physical Exam: Vitals:   06/18/21 1400 06/18/21 1430 06/18/21 1500  06/18/21 1515  BP: (!) 144/68 137/77    Pulse: 72 77 74 70  Resp: '10 15 14 11  '$ Temp:      TempSrc:      SpO2: 93% 96% 95% 92%  Weight:      Height:       General:  Appears calm and comfortable and is in NAD, somewhat stoic Eyes:  PERRL, EOMI, normal lids, iris ENT:  grossly normal hearing, lips & tongue, mmm Neck:  no LAD, masses or thyromegaly Cardiovascular:  RRR, no m/r/g.  Respiratory:   CTA bilaterally with no wheezes/rales/rhonchi.  Normal respiratory effort. Abdomen:  soft, NT, ND Skin:  R foot is swollen and blue (currently looks  better with ABI cuffs on)    Musculoskeletal:  grossly normal tone BUE/BLE,  no bony abnormality Psychiatric:  blunted mood and affect, speech fluent and appropriate, AOx3 Neurologic:  CN 2-12 grossly intact, moves all extremities in coordinated fashion with pain in movement of RLE   Radiological Exams on Admission: Independently reviewed - see discussion in A/P where applicable  VAS [place] ABI WITH/WO TBI  Result Date: 06/18/2021  LOWER EXTREMITY DOPPLER STUDY Patient Name:  Douglas [name]  Date of Exam:   06/18/2021 Medical Rec #: [id number]         Accession #:    [id number] Date of Birth: 1951/03/08          Patient Gender: M Patient Age:   26 years Exam Location:  [facility] Procedure:      VAS [place] ABI WITH/WO TBI Referring Phys: Douglas [name] --------------------------------------------------------------------------------  Indications: Claudication, rest pain, and peripheral artery disease. High Risk Factors: Hypertension, Diabetes.  Comparison Study: No prior studies. Performing Technologist: Carlos Levering RVT  Examination Guidelines: A complete evaluation includes at minimum, Doppler waveform signals and systolic blood pressure reading at the level of bilateral brachial, anterior tibial, and posterior tibial arteries, when vessel segments are accessible. Bilateral testing is considered an integral part of a complete examination. Photoelectric Plethysmograph (PPG) waveforms and toe systolic pressure readings are included as required and additional duplex testing as needed. Limited examinations for reoccurring indications may be performed as noted.  ABI Findings: +---------+------------------+-----+----------+--------+  Right     Rt Pressure (mmHg) Index Waveform   Comment   +---------+------------------+-----+----------+--------+  Brachial  160                      triphasic            +---------+------------------+-----+----------+--------+  PTA       35                 0.22  monophasic            +---------+------------------+-----+----------+--------+  DP        53                 0.33  monophasic           +---------+------------------+-----+----------+--------+  Great Toe 0                  0.00                       +---------+------------------+-----+----------+--------+ +---------+------------------+-----+---------+-------+  Left      Lt Pressure (mmHg) Index Waveform  Comment  +---------+------------------+-----+---------+-------+  Brachial  153                      triphasic          +---------+------------------+-----+---------+-------+  PTA       118                0.74  biphasic           +---------+------------------+-----+---------+-------+  DP        114                0.71  biphasic           +---------+------------------+-----+---------+-------+  Great Toe 52                 0.32                     +---------+------------------+-----+---------+-------+ +-------+-----------+-----------+------------+------------+  ABI/TBI Today's ABI Today's TBI Previous ABI Previous TBI  +-------+-----------+-----------+------------+------------+  Right   0.33        0                                      +-------+-----------+-----------+------------+------------+  Left    0.74        0.33                                   +-------+-----------+-----------+------------+------------+  Summary: Right: Resting right ankle-brachial index indicates severe right lower extremity arterial disease. The right toe-brachial index is abnormal. Unable to obtain TBI due to low amplitude waveforms. Left: Resting left ankle-brachial index indicates moderate left lower extremity arterial disease. The left toe-brachial index is abnormal.  *See table(s) above for measurements and observations.     Preliminary     EKG: Independently reviewed.  Ventricular paced with rate 77   Labs on Admission: I have personally reviewed the available labs and imaging studies at the time of the admission.  Pertinent labs:    Glucose  135 WBC 11.3 Platelets 540 COVID/flu negative    Assessment and Plan: * Critical limb ischemia of right lower extremity ([place]) -Patient with h/o plantar fasciitis, last received injection for this on 2/17 -Within the next 1-2 weeks, he developed foot swelling, pain, discoloration -Since he was already scheduled for podiatry clinic f/u he decided to wait until this appointment to be seen -Upon arrival in the clinic today, there was concern for limb ischemia and he was sent to the ER -Negative lactate -He was started on heparin infusion -ABIs are pending -Angiogram is scheduled for Monday with Dr. Donzetta Matters -Consults include peripheral vascular navigator; [facility] team; and nutrition  Pacemaker -High grade heart block -Pacer in place and working well in January per [name] -[name] was performed in 01/2021 pre-procedure with no obvious obstructive CAD  BPH (benign prostatic hyperplasia) -continue terazosin  Diabetes mellitus ([place]) -Last A1c was 6.7 -He is not on medications at home -Will cover with sensitive-scale SSI  Hypertension -Continue Diovan HCT, amlodipine, and terazosin -Will add prn IV hydralazine  CLL (chronic lymphocytic leukemia) ([place]) -Diagnosed in 11/2018.   -Has done well with ibrutinib, and is continuing this medication. -Followed by Dr. [name]. -Will continue medication but his wife will need to bring in form home. -This condition does predispose him to thromboembolic disease     Advance Care Planning:   Code Status: Full Code   Consults: Vascular surgery; peripheral vascular navigator; TOC team; nutrition  DVT Prophylaxis: Heparin  Family Communication: Wife was present throughout evaluation  Severity of Illness: The appropriate  patient status for this patient is INPATIENT. Inpatient status is judged to be reasonable and necessary in order to provide the required intensity of service to ensure the patient's safety. The patient's presenting symptoms, physical  exam findings, and initial radiographic and laboratory data in the context of their chronic comorbidities is felt to place them at high risk for further clinical deterioration. Furthermore, it is not anticipated that the patient will be medically stable for discharge from the hospital within 2 midnights of admission.   * I certify that at the point of admission it is my clinical judgment that the patient will require inpatient hospital care spanning beyond 2 midnights from the point of admission due to high intensity of service, high risk for further deterioration and high frequency of surveillance required.*  Author: Karmen Bongo, MD 06/18/2021 3:32 PM  For on call review [email or web address].

## 2021-06-18 NOTE — Assessment & Plan Note (Addendum)
-  continue terazosin. Has intermittent issues of urinary retention

## 2021-06-18 NOTE — ED Notes (Signed)
Vascular tech at bedside. °

## 2021-06-18 NOTE — ED Notes (Signed)
Attempted ABI, unable. Unable to doppler pulses in R foot/ankle, no signal identified.  ?

## 2021-06-18 NOTE — ED Notes (Signed)
Belongings bagged and labeled. Report given to yellow.  ?

## 2021-06-18 NOTE — Consult Note (Addendum)
?Hospital Consult ? ? ? ?Reason for Consult: Ischemic right leg ?Referring Physician: ED ?MRN #:  [id number] ? ?History of Present Illness: [name] who vascular surgery has been consulted with concern for ischemic right leg.  [name].  He states he has had pain in the right foot for the last 2 weeks that has been constant.  Prior to this he endorses about a 2 to 60-monthhistory of right calf claudication after walking long distances.  He denies tobacco abuse.  No previous history of lower extremity interventions.  He denies any numbness in the foot and can wiggle his toes.  Main complaint is pain in the foot.  He has been treated for chronic fasciitis in the right foot by his podiatrist. ? ?Past Medical History:  ?Diagnosis Date  ? BPH (benign prostatic hyperplasia)   ? CLL (chronic lymphocytic leukemia) ([place]   ? Hypertension   ? Pacemaker   ? ? ?Past Surgical History:  ?Procedure Laterality Date  ? LEFT HEART CATH AND CORONARY ANGIOGRAPHY N/A 01/18/2021  ? Procedure: LEFT HEART CATH AND CORONARY ANGIOGRAPHY;  Surgeon: MBurnell Blanks MD;  [facility];  Service: Cardiovascular;  Laterality: N/A;  ? PACEMAKER IMPLANT N/A 01/19/2021  ? Procedure: PACEMAKER IMPLANT;  Surgeon: TEvans Lance MD;  [facility];  Service: Cardiovascular;  Laterality: N/A;  ? ? ?No Known Allergies ? ?Prior to Admission medications   ?Medication Sig Start Date End Date Taking? Authorizing Provider  ?albuterol (PROVENTIL HFA;VENTOLIN HFA) 108 (90 BASE) MCG/ACT inhaler Inhale 2 puffs into the lungs 4 (four) times daily. ?Patient taking differently: Inhale 2 puffs into the lungs every 6 (six) hours as needed for shortness of breath or wheezing. 07/18/13  Yes KHarden Mo MD  ?amLODipine (NORVASC) 5 MG tablet Take 5 mg by mouth daily.   Yes [provider]  ?IMBRUVICA 420  MG tablet TAKE 1 TABLET BY MOUTH ONCE  DAILY WITH A FULL GLASS OF WATER ?Patient taking differently: 420 mg daily. WITH A FULL GLASS OF  WATER 06/14/21  Yes Shadad, FMathis Dad MD  ?Lidocaine 4 % LOTN Apply 1 application. topically daily as needed (pain).   Yes [provider]  ?montelukast (SINGULAIR) 10 MG tablet Take 10 mg by mouth daily as needed (allergies).   Yes [provider]  ?oxymetazoline (AFRIN) 0.05 % nasal spray Place 1 spray into both nostrils 2 (two) times daily as needed for congestion.   Yes [provider]  ?terazosin (HYTRIN) 10 MG capsule Take 10 mg by mouth at bedtime.   Yes [provider]  ?valsartan-hydrochlorothiazide (DIOVAN-HCT) 320-25 MG tablet Take 1 tablet by mouth daily.   Yes [provider]  ? ? ?Social History  ? ?Socioeconomic History  ? Marital status: Married  ?  Spouse name: Not on file  ? Number of children: Not on file  ? Years of education: Not on file  ? Highest education level: Not on file  ?Occupational History  ? Occupation: retired  ?Tobacco Use  ? Smoking status: Former  ?  Packs/day: 0.50  ?  Years: 10.00  ?  Pack years: 5.00  ?  Types: Cigarettes  ? Smokeless tobacco: Never  ?Substance and Sexual Activity  ? Alcohol use: Not Currently  ? Drug use: Never  ? Sexual activity: Not on file  ?Other Topics  Concern  ? Not on file  ?Social History Narrative  ? Not on file  ? ?Social Determinants of Health  ? ?Financial Resource Strain: Not on file  ?Food Insecurity: Not on file  ?Transportation Needs: Not on file  ?Physical Activity: Not on file  ?Stress: Not on file  ?Social Connections: Not on file  ?Intimate Partner Violence: Not on file  ? ? ? ?Family History  ?Problem Relation Age of Onset  ? Lung cancer Mother   ? Coronary artery disease Father   ? ? ?ROS: '[x]'$  Positive   '[ ]'$  Negative   '[ ]'$  All sytems reviewed and are negative ? ?Cardiovascular: ?'[]'$  chest pain/pressure ?'[]'$  palpitations ?'[]'$  SOB lying flat ?'[]'$  DOE ?'[]'$  pain in legs  while walking ?'[x]'$  pain in legs at rest - right foot ?'[x]'$  pain in legs at night - right foot ?'[]'$  non-healing ulcers ?'[]'$  hx of DVT ?'[]'$  swelling in legs ? ?Pulmonary: ?'[]'$  productive cough ?'[]'$  asthma/wheezing ?'[]'$  home O2 ? ?Neurologic: ?'[]'$  weakness in '[]'$  arms '[]'$  legs ?'[]'$  numbness in '[]'$  arms '[]'$  legs ?'[]'$  hx of CVA '[]'$  mini stroke ?'[]'$ difficulty speaking or slurred speech ?'[]'$  temporary loss of vision in one eye ?'[]'$  dizziness ? ?Hematologic: ?'[]'$  hx of cancer ?'[]'$  bleeding problems ?'[]'$  problems with blood clotting easily ? ?Endocrine:  ? '[]'$  diabetes '[]'$  thyroid disease ? ?GI ?'[]'$  vomiting blood ?'[]'$  blood in stool ? ?GU: ?'[]'$  CKD/renal failure '[]'$  HD--'[]'$  M/W/F or '[]'$  T/T/S ?'[]'$  burning with urination ?'[]'$  blood in urine ? ?Psychiatric: ?'[]'$  anxiety ?'[]'$  depression ? ?Musculoskeletal: ?'[]'$  arthritis ?'[]'$  joint pain ? ?Integumentary: ?'[]'$  rashes '[]'$  ulcers ? ?Constitutional: ?'[]'$  fever '[]'$  chills ? ? ?Physical Examination ? ?Vitals:  ? 06/18/21 1300 06/18/21 1315  ?BP: (!) 142/71 (!) 150/70  ?Pulse: 76 73  ?Resp: 15 16  ?Temp:    ?SpO2: 94% 93%  ? ?Body mass index is 30.52 kg/m?. ? ?General:  WDWN in NAD ?Gait: Not observed ?HENT: WNL, normocephalic ?Pulmonary: normal non-labored breathing ?Cardiac: regular, without  Murmurs, rubs or gallops ?Abdomen:  soft, NT/ND ?Vascular Exam/Pulses: ?Palpable femoral pulses bilaterally ?Left DP and PT pulse palpable ?On the right no palpable popliteal pulse ?No palpable right pedal pulses ?Right PT monophasic at the ankle and right peroneal pretty brisk by Doppler ?Right foot is grossly motor/sensory intact ?Extremities: Ischemic changes to the right foot ?Musculoskeletal: no muscle wasting or atrophy  ?Neurologic: A&O X 3; Appropriate Affect ; SENSATION: normal; MOTOR FUNCTION:  moving all extremities equally. Speech is fluent/normal ? ? ?CBC ?   ?Component Value Date/Time  ? WBC 11.3 (H) 06/18/2021 1044  ? RBC 5.31 06/18/2021 1044  ? HGB 15.7 06/18/2021 1044  ? HGB 14.9 05/04/2021 0752  ? HCT 47.8  06/18/2021 1044  ? PLT 540 (H) 06/18/2021 1044  ? PLT 352 05/04/2021 0752  ? MCV 90.0 06/18/2021 1044  ? MCH 29.6 06/18/2021 1044  ? MCHC 32.8 06/18/2021 1044  ? RDW 15.4 06/18/2021 1044  ? LYMPHSABS 2.2 06/18/2021 1044  ? MONOABS 0.5 06/18/2021 1044  ? EOSABS 0.1 06/18/2021 1044  ? BASOSABS 0.1 06/18/2021 1044  ? ? ?BMET ?   ?Component Value Date/Time  ? NA 136 06/18/2021 1044  ? K 3.8 06/18/2021 1044  ? CL 98 06/18/2021 1044  ? CO2 27 06/18/2021 1044  ? GLUCOSE 135 (H) 06/18/2021 1044  ? BUN 22 06/18/2021 1044  ? CREATININE 0.88 06/18/2021 1044  ? CREATININE 0.94 05/04/2021 0752  ? CALCIUM 8.9 06/18/2021 1044  ?  GFRNONAA >60 06/18/2021 1044  ? GFRNONAA >60 05/04/2021 0752  ? GFRAA >60 01/08/2020 0810  ? ? ?COAGS: ?Lab Results  ?Component Value Date  ? INR 1.0 06/18/2021  ? INR 1.0 01/16/2021  ? ? ? ?Non-Invasive Vascular Imaging:   ? ?ABIs pending ? ? ?ASSESSMENT/PLAN: This is a 71 y.o. male with history of CLL and hypertension who presents to the ED and vascular surgery was consulted with concern for an ischemic right leg.  He endorses about 3 months of right calf claudication and over the last 2 weeks has had what sounds like classic rest pain in the right foot.  Unclear if this is progression of chronic disease or he has had an acute on chronic event.  His exam is consistent with critical limb ischemia at this time.  He has fairly pronounced mottling on the plantar surface of the foot and certainly could be a atheroembolic event as well.  On exam he has a fairly normal motor/sensory exam but has severe pain in the foot.  He has a monophasic posterior tibial signal and a more brisk peroneal signal at the right ankle.  I have recommended admission to medicine and start him on a heparin drip.  I have scheduled him for Monday for aortogram, lower extremity arteriogram, and possible intervention with a focus on the right leg.  Discussed if he has no endovascular options may ultimately require surgical bypass.  Vascular surgery will follow through the weekend.  We have ordered ABIs today. ? ?Marty Heck, MD ?[facility] ?Office: [phone] ? ?Marty Heck ? ?

## 2021-06-18 NOTE — Plan of Care (Signed)

## 2021-06-18 NOTE — ED Notes (Signed)
EDP back at [facility].  

## 2021-06-18 NOTE — ED Notes (Signed)
Pt alert, NAD, calm, interactive. C/o R foot/ankle pain only. Denies CP, SOB, or other sx. Pain isolated to R ankle/foot only. Unable to palpate pedal pulses on R.  ?

## 2021-06-18 NOTE — ED Notes (Signed)
EDP, and vascular surgery team at [facility].  ?

## 2021-06-18 NOTE — ED Provider Notes (Signed)
?[facility] ?Provider Note ? ? ?CSN: [id number] ?Arrival date & time: 06/18/21  9470 ? ?  ? ?History ? ?Chief Complaint  ?Patient presents with  ? Foot Swelling  ? ? ?Douglas [name] is a 71 y.o. male. ? ?HPI ?Patient presents from home with his wife due to concern of pain, swelling in the right foot.  He noted that he has had ongoing therapy with his podiatrist for presumed planter fasciitis.  Over the past 2 weeks he has noticed increasing pain, swelling, discoloration of the right foot in its entirety.  He has some preserved capacity to move it, but this is decreasing as well.  Today after seeing his podiatrist he was sent here for evaluation. ?Beyond distal right lower extremity he states that he feels otherwise generally well.  He acknowledges a history of CLL, pacemaker placement last fall.  He takes no medication in terms of anticoagulant, antiplatelet regularly. ?  ? ?Home Medications ?Prior to Admission medications   ?Medication Sig Start Date End Date Taking? Authorizing Provider  ?albuterol (PROVENTIL HFA;VENTOLIN HFA) 108 (90 BASE) MCG/ACT inhaler Inhale 2 puffs into the lungs 4 (four) times daily. ?Patient taking differently: Inhale 2 puffs into the lungs every 6 (six) hours as needed for shortness of breath or wheezing. 07/18/13   Harden Mo, MD  ?amLODipine (NORVASC) 5 MG tablet Take 5 mg by mouth daily.    [provider]  ?azelastine (ASTELIN) 0.1 % nasal spray Place 1 spray into both nostrils as needed for rhinitis. Use in each nostril as directed    [provider]  ?IMBRUVICA 420 MG tablet TAKE 1 TABLET BY MOUTH ONCE  DAILY WITH A FULL GLASS OF WATER 06/14/21   Wyatt Portela, MD  ?loratadine (CLARITIN) 10 MG tablet Take 10 mg by mouth daily as needed.     [provider]  ?montelukast (SINGULAIR) 10 MG tablet Take 10 mg by mouth as needed.    [provider]  ?terazosin (HYTRIN) 10 MG capsule Take 10 mg by mouth at  bedtime.    [provider]  ?valsartan-hydrochlorothiazide (DIOVAN-HCT) 320-25 MG tablet Take 1 tablet by mouth daily.    [provider]  ?   ? ?Allergies    ?Patient has no known allergies.   ? ?Review of Systems   ?Review of Systems  ?Constitutional:   ?     Per HPI, otherwise negative  ?HENT:    ?     Per HPI, otherwise negative  ?Respiratory:    ?     Per HPI, otherwise negative  ?Cardiovascular:   ?     Per HPI, otherwise negative  ?Gastrointestinal:  Negative for vomiting.  ?Endocrine:  ?     Negative aside from HPI  ?Genitourinary:   ?     Neg aside from HPI   ?Musculoskeletal:   ?     Per HPI, otherwise negative  ?Skin:  Positive for color change and pallor.  ?Neurological:  Negative for syncope.  ? ?Physical Exam ?Updated Vital Signs ?BP (!) 148/78   Pulse 83   Temp 98.4 ?F (36.9 ?C) (Oral)   Resp 16   SpO2 96%  ?Physical Exam ?Vitals and nursing note reviewed.  ?Constitutional:   ?   General: He is not in acute distress. ?   Appearance: He is well-developed.  ?HENT:  ?   Head: Normocephalic and atraumatic.  ?Eyes:  ?   Conjunctiva/sclera: Conjunctivae normal.  ?Cardiovascular:  ?  Rate and Rhythm: Normal rate and regular rhythm.  ?Pulmonary:  ?   Effort: Pulmonary effort is normal. No respiratory distress.  ?   Breath sounds: No stridor.  ?Abdominal:  ?   General: There is no distension.  ?Musculoskeletal:  ?     Legs: ? ?   Comments: No discernible pulses  ?Skin: ?   General: Skin is warm and dry.  ?Neurological:  ?   Mental Status: He is alert and oriented to person, place, and time.  ? ? ?ED Results / Procedures / Treatments   ?Labs ?(all labs ordered are listed, but only abnormal results are displayed) ?Labs Reviewed  ?RESP PANEL BY RT-PCR (FLU A&B, COVID) ARPGX2  ?COMPREHENSIVE METABOLIC PANEL  ?CBC WITH DIFFERENTIAL/PLATELET  ?PROTIME-INR  ? ? ?EKG ?None ? ?Radiology ?No results found. ? ?Procedures ?Procedures  ? ? ?Medications Ordered in ED ?Medications  ?fentaNYL  (SUBLIMAZE) injection 50 mcg (50 mcg Intravenous Given 06/18/21 1103)  ? ? ?ED Course/ Medical Decision Making/ A&P ? This patient presents to the ED for concern of pain, pallor in the right foot, this involves an extensive number of treatment options, and is a complaint that carries with it a high risk of complications and morbidity.  The differential diagnosis includes limb ischemia, infection, neuropathy ? ? ?Co morbidities that complicate the patient evaluation ? ?CLL, hypertension diabetes ? ? ?Social Determinants of Health: ? ?Age ? ? ?Additional history obtained: ? ?Additional history and/or information obtained from wife, chart review ?External records from outside source obtained and reviewed including history of illness over the past 2 weeks, and podiatry visit from earlier today notes with recommendation to go to the ED secondary to pain ? ? ?After the initial evaluation, orders, including: CT angiography, labs were initiated. ? ?Patient placed on Cardiac and Pulse-Oximetry Monitors. ?The patient was maintained on a cardiac monitor.  The cardiac monitored showed an rhythm of 80 sinus normal ?The patient was also maintained on pulse oximetry. The readings were typically 99% room air normal ? ?On repeat evaluation of the patient stayed the same ? ?Lab Tests: ? ?I personally interpreted labs.  The pertinent results include:  Leukocytosis ? ? ?Consultations Obtained: ? ?I requested consultation with the vascular surgeon, Dr. Carlis Abbott,  and discussed lab and imaging findings as well as pertinent plan - they recommend: At bedside we discussed the patient's presentation, recommendation is for initiation of heparin drip, admission for planned angiography, no indication for CT angiography currently.  Patient, wife, also aware of recommendation ? ?Dispostion / Final MDM: ? ?After consideration of the diagnostic results and the patient's response to treatment, he will require admission.  This adult male presents with  painful sore right lower extremity, and on exam is found to have pallor, pulses only palpable with Doppler monophasic dorsalis pedis, otherwise none appreciable.  Case discussed at length with our vascular surgeon, Dr. Carlis Abbott, who will facilitate ongoing care with our internal medicine colleague with whom I also discussed the patient's case.  With need for ongoing heparin infusion secondary to claudication, ischemia, patient was admitted to the stepdown unit. ? 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 ?CRITICAL CARE ?Performed by: Carmin Muskrat ?Total critical care time: 45 minutes ?Critical care time was exclusive of separately billable procedures and treating other patients. ?Critical care was necessary to treat or prevent imminent or life-threatening deterioration. ?Critical care was time spent personally by me on the  following activities: development of treatment plan with patient and/or surrogate as well as nursing, discussions with consultants, evaluation of patient's response to treatment, examination of patient, obtaining history from patient or surrogate, ordering and performing treatments and interventions, ordering and review of laboratory studies, ordering and review of radiographic studies, pulse oximetry and re-evaluation of patient's condition. ? ?  ?Carmin Muskrat, MD ?06/18/21 1332 ? ?

## 2021-06-18 NOTE — Assessment & Plan Note (Addendum)
-  Patient with h/o plantar fasciitis, last received injection for this on 2/17 -Within the next 1-2 weeks, he developed foot swelling, pain, discoloration -Upon arrival in the podiatry office, there was concern for limb ischemia and he was sent to the ER -He was started on heparin infusion -ABIs showed severe right lower extremity arterial disease, moderate left lower extremity arterial disease -Found to have critical right lower extremity ischemia.  Status post angiogram.  Vascular surgery did bypass, thrombectomy on 3/14. -There is a concern from vascular surgery that his bypass procedure has failed,plan for right BKA today

## 2021-06-18 NOTE — Assessment & Plan Note (Addendum)
-  Last A1c was 6.7 ?-He is not on medications at home ?-Continue with sensitive-scale SSI ?

## 2021-06-18 NOTE — Progress Notes (Signed)
ANTICOAGULATION CONSULT NOTE - Initial Consult ? ?Pharmacy Consult for heparin infusion ? ?No Known Allergies ? ?Patient Measurements: ?Height: 6' (182.9 cm) ?Weight: 102.1 kg (225 lb) ?IBW/kg (Calculated) : 77.6 ?Heparin Dosing Weight: 98.5 ? ?Vital Signs: ?Temp: 98.4 ?F (36.9 ?C) (03/10 5916) ?Temp Source: Oral (03/10 0931) ?BP: 158/80 (03/10 1145) ?Pulse Rate: 76 (03/10 1145) ? ?Labs: ?Recent Labs  ?  06/18/21 ?1044  ?HGB 15.7  ?HCT 47.8  ?PLT 540*  ?LABPROT 13.4  ?INR 1.0  ?CREATININE 0.88  ? ? ?CrCl cannot be calculated (Unknown ideal weight.). ? ? ?Medical History: ?Past Medical History:  ?Diagnosis Date  ? BPH (benign prostatic hyperplasia)   ? CLL (chronic lymphocytic leukemia) ([place])   ? Hypertension   ? ? ?Assessment: ?71 yo M sent to ED by podiatry for increased swelling and diminished pulses in R foot. Pt has a history of BPH, CLL, and HTN. Pt is not on any anticoagulation at home. Vascular surgery consulted to evaluate RLE. Pharmacy consulted to initiate and manage heparin infusion for RLE limb ischemia.  ? ?Goal of Therapy:  ?Heparin level 0.3-0.7 units/ml ?Monitor platelets by anticoagulation protocol: Yes ?  ?Plan:  ?Give 5900 units bolus x 1 ?Start heparin infusion at 1600 units/hr ?Check anti-Xa level in 8 hours and daily while on heparin ?Continue to monitor H&H and platelets  ? ?Kaleen Mask ?06/18/2021,12:00 PM ? ? ?

## 2021-06-18 NOTE — Progress Notes (Signed)
ABI's have been completed. ?Preliminary results can be found in CV Proc through chart review.  ? ?06/18/21 3:15 PM ?Carlos Levering RVT   ?

## 2021-06-18 NOTE — ED Notes (Signed)
Pt into room via w/c. Family and EDP into room.  ?

## 2021-06-18 NOTE — Assessment & Plan Note (Signed)
-  High grade heart block ?-Pacer in place and working well in January per [name] ?-[name] was performed in 01/2021 pre-procedure with no obvious obstructive CAD ?

## 2021-06-18 NOTE — ED Notes (Signed)
Pt alert, NAD, calm, interactive, resps e/u. EDP called away.  ?

## 2021-06-18 NOTE — Assessment & Plan Note (Signed)
-  Diagnosed in 11/2018.   -Has done well with ibrutinib, and is continuing this medication. -Followed by Dr. [name]. -Will continue medication but his wife will need to bring in form home. -This condition does predispose him to thromboembolic disease

## 2021-06-18 NOTE — Assessment & Plan Note (Addendum)
-  Continue Diovan HCT, amlodipine, and terazosin -Continue prn IV hydralazine

## 2021-06-18 NOTE — ED Triage Notes (Signed)
Patient coming from home, sent by dr for cold foot. Complaint of increased swelling and diminished pulse in right foot.  ?

## 2021-06-19 DIAGNOSIS — I70221 Atherosclerosis of native arteries of extremities with rest pain, right leg: Secondary | ICD-10-CM | POA: Diagnosis not present

## 2021-06-19 DIAGNOSIS — I70211 Atherosclerosis of native arteries of extremities with intermittent claudication, right leg: Secondary | ICD-10-CM

## 2021-06-19 LAB — GLUCOSE, CAPILLARY
Glucose-Capillary: 171 mg/dL — ABNORMAL HIGH (ref 70–99)
Glucose-Capillary: 140 mg/dL — ABNORMAL HIGH (ref 70–99)
Glucose-Capillary: 127 mg/dL — ABNORMAL HIGH (ref 70–99)
Glucose-Capillary: 144 mg/dL — ABNORMAL HIGH (ref 70–99)
Glucose-Capillary: 132 mg/dL — ABNORMAL HIGH (ref 70–99)

## 2021-06-19 LAB — CBC
HCT: 44.5 % (ref 39.0–52.0)
Hemoglobin: 14.8 g/dL (ref 13.0–17.0)
MCH: 29.6 pg (ref 26.0–34.0)
MCHC: 33.3 g/dL (ref 30.0–36.0)
MCV: 89 fL (ref 80.0–100.0)
Platelets: 543 10*3/uL — ABNORMAL HIGH (ref 150–400)
RBC: 5 MIL/uL (ref 4.22–5.81)
RDW: 15.4 % (ref 11.5–15.5)
WBC: 11.6 10*3/uL — ABNORMAL HIGH (ref 4.0–10.5)
nRBC: 0 % (ref 0.0–0.2)

## 2021-06-19 LAB — BASIC METABOLIC PANEL
Anion gap: 9 (ref 5–15)
BUN: 19 mg/dL (ref 8–23)
CO2: 27 mmol/L (ref 22–32)
Calcium: 8.8 mg/dL — ABNORMAL LOW (ref 8.9–10.3)
Chloride: 96 mmol/L — ABNORMAL LOW (ref 98–111)
Creatinine, Ser: 0.87 mg/dL (ref 0.61–1.24)
GFR, Estimated: >60 mL/min (ref >60)
Glucose, Bld: 143 mg/dL — ABNORMAL HIGH (ref 70–99)
Potassium: 3.4 mmol/L — ABNORMAL LOW (ref 3.5–5.1)
Sodium: 132 mmol/L — ABNORMAL LOW (ref 135–145)

## 2021-06-19 LAB — HEPARIN LEVEL (UNFRACTIONATED)
Heparin Unfractionated: <0.1 IU/mL — ABNORMAL LOW (ref 0.30–0.70)
Heparin Unfractionated: 0.36 IU/mL (ref 0.30–0.70)

## 2021-06-19 MED ORDER — PROCHLORPERAZINE EDISYLATE 10 MG/2ML IJ SOLN
10.0000 mg | Freq: Four times a day (QID) | INTRAMUSCULAR | Status: DC | PRN
Start: 2021-06-19 — End: 2021-07-01
  Administered 2021-06-19 – 2021-06-22 (×3): 10 mg via INTRAVENOUS
  Filled 2021-06-19 (×3): qty 2

## 2021-06-19 MED ORDER — FAMOTIDINE 20 MG PO TABS
20.0000 mg | ORAL_TABLET | Freq: Once | ORAL | Status: AC
  Administered 2021-06-19: 20 mg via ORAL
  Filled 2021-06-19: qty 1

## 2021-06-19 MED ORDER — HEPARIN BOLUS VIA INFUSION
2500.0000 [IU] | Freq: Once | INTRAVENOUS | Status: AC
  Administered 2021-06-19: 2500 [IU] via INTRAVENOUS
  Filled 2021-06-19: qty 2500

## 2021-06-19 MED ORDER — POTASSIUM CHLORIDE CRYS ER 20 MEQ PO TBCR
40.0000 meq | EXTENDED_RELEASE_TABLET | Freq: Once | ORAL | Status: AC
  Administered 2021-06-19: 40 meq via ORAL
  Filled 2021-06-19: qty 2

## 2021-06-19 NOTE — Progress Notes (Signed)
ANTICOAGULATION CONSULT NOTE - Follow-up Consult ? ?Pharmacy Consult for heparin infusion ? ?No Known Allergies ? ?Patient Measurements: ?Height: 6' (182.9 cm) ?Weight: 102.1 kg (225 lb) ?IBW/kg (Calculated) : 77.6 ?Heparin Dosing Weight: 98.5 ? ?Vital Signs: ?Temp: 98.2 ?F (36.8 ?C) (03/11 1607) ?Temp Source: Oral (03/11 1607) ?BP: 150/66 (03/11 1607) ?Pulse Rate: 81 (03/11 1607) ? ?Labs: ?Recent Labs  ?  06/18/21 ?1044 06/18/21 ?2033 06/19/21 ?1497 06/19/21 ?1525  ?HGB 15.7  --  14.8  --   ?HCT 47.8  --  44.5  --   ?PLT 540*  --  543*  --   ?LABPROT 13.4  --   --   --   ?INR 1.0  --   --   --   ?HEPARINUNFRC  --  0.10* <0.10* 0.36  ?CREATININE 0.88  --  0.87  --   ? ? ? ?Estimated Creatinine Clearance: 94.9 mL/min (by C-G formula based on SCr of 0.87 mg/dL). ? ? ?Medical History: ?Past Medical History:  ?Diagnosis Date  ? BPH (benign prostatic hyperplasia)   ? CLL (chronic lymphocytic leukemia) ([place])   ? Hypertension   ? Pacemaker   ? ? ?Assessment: ?71 yo M sent to ED by podiatry for increased swelling and diminished pulses in R foot. Pt has a history of BPH, CLL, and HTN. Pt is not on any anticoagulation at home. Vascular surgery consulted to evaluate RLE. Pharmacy consulted to initiate and manage heparin infusion for RLE limb ischemia.  ? ?Heparin level therapeutic at 0.36. Will empirically increase infusion rate slightly given bolus prior to last rate change. ? ? ?Goal of Therapy:  ?Heparin level 0.3-0.7 units/ml ?Monitor platelets by anticoagulation protocol: Yes ?  ?Plan:  ?Increase heparin to 2000 units/h ?Recheck heparin level with am labs ? ?Arrie Senate, PharmD, BCPS, BCCP ?Clinical Pharmacist ?[phone] ?Please check AMION for all [place] numbers ?06/19/2021 ? ? ? ? ?

## 2021-06-19 NOTE — Progress Notes (Signed)
PROGRESS NOTE  Douglas [name]  VCB:[id number] DOB: 11-02-1950 DOA: 06/18/2021 PCP: [name], MD   Brief Narrative: Patient is a 71 year old male with history of BPH, CLL, hypertension who presented with right foot swelling, purple discoloration, pain, inability to ambulate.  On presentation he was found to have ischemic right lower extremity, started on heparin drip.  Vascular surgery consulted and planning for intervention on Monday.    Assessment & Plan:  Principal Problem:   Critical limb ischemia of right lower extremity (HCC) Active Problems:   CLL (chronic lymphocytic leukemia) (HCC)   Hypertension   Diabetes mellitus (HCC)   BPH (benign prostatic hyperplasia)   Pacemaker   Assessment and Plan: * Critical limb ischemia of right lower extremity ([place]) -Patient with h/o plantar fasciitis, last received injection for this on 2/17 -Within the next 1-2 weeks, he developed foot swelling, pain, discoloration -Upon arrival in the podiatry office, there was concern for limb ischemia and he was sent to the ER -Negative lactate -He was started on heparin infusion -ABIs showed severe right lower extremity arterial disease, moderate left lower extremity arterial disease -Angiogram is scheduled for Monday with Dr. Donzetta Matters -Consults include peripheral vascular navigator; [facility] team; and nutrition  Pacemaker -High grade heart block -Pacer in place and working well in January per [name] -[name] was performed in 01/2021 pre-procedure with no obvious obstructive CAD  BPH (benign prostatic hyperplasia) -continue terazosin  Diabetes mellitus ([place]) -Last A1c was 6.7 -He is not on medications at home -Continue with sensitive-scale SSI  Hypertension -Continue Diovan HCT, amlodipine, and terazosin -Continue prn IV hydralazine  CLL (chronic lymphocytic leukemia) ([place]) -Diagnosed in 11/2018.   -Has done well with ibrutinib, and is continuing this medication. -Followed by Dr.  [name]. -Will continue medication but his wife will need to bring in form home. -This condition does predispose him to thromboembolic disease           DVT prophylaxis:IV heparin     Code Status: Full Code  Family Communication: None at beside  Patient status:Inpatient  Patient is from :Home  Anticipated discharge BW:GYKZ  Estimated DC date:tomorrow   Consultants: Vascular surgery  Procedures:None   Antimicrobials:  Anti-infectives (From admission, onward)    None       Subjective:  Patient seen and examined at the bedside this morning.  Hemodynamically stable.  Complains of nausea, pain on the right lower extremity   Objective: Vitals:   06/18/21 1643 06/18/21 2101 06/19/21 0543 06/19/21 0823  BP: (!) 142/72 139/74 139/72 (!) 153/77  Pulse: 81 74 82 82  Resp: '15 16  11  '$ Temp: 98.7 F (37.1 C) 98.7 F (37.1 C) 97.7 F (36.5 C) 98.1 F (36.7 C)  TempSrc: Oral Oral Oral Oral  SpO2: 97% 96% 95% 95%  Weight:      Height:        Intake/Output Summary (Last 24 hours) at 06/19/2021 1115 Last data filed at 06/19/2021 0500 Gross per 24 hour  Intake 142.4 ml  Output 1000 ml  Net -857.6 ml   Filed Weights   06/18/21 1200  Weight: 102.1 kg    Examination:  General exam:  not in distress HEENT: PERRL Respiratory system:  no wheezes or crackles  Cardiovascular system: S1 & S2 heard, RRR.  Gastrointestinal system: Abdomen is nondistended, soft and nontender. Central nervous system: Alert and oriented Extremities: Bluish discoloration, edema, cold right foot Skin: No rashes, no ulcers,no icterus     Data Reviewed: I have personally reviewed  following labs and imaging studies  CBC: Recent Labs  Lab 06/18/21 1044 06/19/21 0338  WBC 11.3* 11.6*  NEUTROABS 8.4*  --   HGB 15.7 14.8  HCT 47.8 44.5  MCV 90.0 89.0  PLT 540* 989*   Basic Metabolic Panel: Recent Labs  Lab 06/18/21 1044 06/19/21 0338  NA 136 132*  K 3.8 3.4*  CL 98 96*   CO2 27 27  GLUCOSE 135* 143*  BUN 22 19  CREATININE 0.88 0.87  CALCIUM 8.9 8.8*     Recent Results (from the past 240 hour(s))  Resp Panel by RT-PCR (Flu A&B, Covid) Nasopharyngeal Swab     Status: None   Collection Time: 06/18/21 10:44 AM   Specimen: Nasopharyngeal Swab; Nasopharyngeal(NP) swabs in vial transport medium  Result Value Ref Range Status   SARS Coronavirus 2 by RT PCR NEGATIVE NEGATIVE Final    Comment: (NOTE) SARS-CoV-2 target nucleic acids are NOT DETECTED.  The SARS-CoV-2 RNA is generally detectable in upper respiratory specimens during the acute phase of infection. The lowest concentration of SARS-CoV-2 viral copies this assay can detect is 138 copies/mL. A negative result does not preclude SARS-Cov-2 infection and should not be used as the sole basis for treatment or other patient management decisions. A negative result may occur with  improper specimen collection/handling, submission of specimen other than nasopharyngeal swab, presence of viral mutation(s) within the areas targeted by this assay, and inadequate number of viral copies(<138 copies/mL). A negative result must be combined with clinical observations, patient history, and epidemiological information. The expected result is Negative.  Fact Sheet for Patients:  EntrepreneurPulse.com.au  Fact Sheet for Healthcare Providers:  IncredibleEmployment.be  This test is no t yet approved or cleared by the [place] FDA and  has been authorized for detection and/or diagnosis of SARS-CoV-2 by FDA under an Emergency Use Authorization (EUA). This EUA will remain  in effect (meaning this test can be used) for the duration of the COVID-19 declaration under Section 564(b)(1) of the Act, 21 U.S.C.section 360bbb-3(b)(1), unless the authorization is terminated  or revoked sooner.       Influenza A by PCR NEGATIVE NEGATIVE Final   Influenza B by PCR NEGATIVE NEGATIVE  Final    Comment: (NOTE) The Xpert Xpress SARS-CoV-2/FLU/RSV plus assay is intended as an aid in the diagnosis of influenza from Nasopharyngeal swab specimens and should not be used as a sole basis for treatment. Nasal washings and aspirates are unacceptable for Xpert Xpress SARS-CoV-2/FLU/RSV testing.  Fact Sheet for Patients: EntrepreneurPulse.com.au  Fact Sheet for Healthcare Providers: IncredibleEmployment.be  This test is not yet approved or cleared by the [place] FDA and has been authorized for detection and/or diagnosis of SARS-CoV-2 by FDA under an Emergency Use Authorization (EUA). This EUA will remain in effect (meaning this test can be used) for the duration of the COVID-19 declaration under Section 564(b)(1) of the Act, 21 U.S.C. section 360bbb-3(b)(1), unless the authorization is terminated or revoked.  Performed at [facility], [place] [address] [place], [place] [address]      Radiology Studies: VAS [place] ABI WITH/WO TBI  Result Date: 06/18/2021  LOWER EXTREMITY DOPPLER STUDY Patient Name:  DIONTE BLAUSTEIN  Date of Exam:   06/18/2021 Medical Rec #: [id number]         Accession #:    [id number] Date of Birth: [birth year]          Patient Gender: M Patient Age:   35 years Exam Location:  [facility] Procedure:      VAS [place] ABI WITH/WO TBI Referring Phys: ROBERT LOCKWOOD --------------------------------------------------------------------------------  Indications: Claudication, rest pain, and peripheral artery disease. High Risk Factors: Hypertension, Diabetes.  Comparison Study: No prior studies. Performing Technologist: Carlos Levering RVT  Examination Guidelines: A complete evaluation includes at minimum, Doppler waveform signals and systolic blood pressure reading at the level of bilateral brachial, anterior tibial, and posterior tibial arteries, when vessel segments are accessible. Bilateral testing is considered an integral  part of a complete examination. Photoelectric Plethysmograph (PPG) waveforms and toe systolic pressure readings are included as required and additional duplex testing as needed. Limited examinations for reoccurring indications may be performed as noted.  ABI Findings: +---------+------------------+-----+----------+--------+  Right     Rt Pressure (mmHg) Index Waveform   Comment   +---------+------------------+-----+----------+--------+  Brachial  160                      triphasic            +---------+------------------+-----+----------+--------+  PTA       35                 0.22  monophasic           +---------+------------------+-----+----------+--------+  DP        53                 0.33  monophasic           +---------+------------------+-----+----------+--------+  Great Toe 0                  0.00                       +---------+------------------+-----+----------+--------+ +---------+------------------+-----+---------+-------+  Left      Lt Pressure (mmHg) Index Waveform  Comment  +---------+------------------+-----+---------+-------+  Brachial  153                      triphasic          +---------+------------------+-----+---------+-------+  PTA       118                0.74  biphasic           +---------+------------------+-----+---------+-------+  DP        114                0.71  biphasic           +---------+------------------+-----+---------+-------+  Great Toe 52                 0.32                     +---------+------------------+-----+---------+-------+ +-------+-----------+-----------+------------+------------+  ABI/TBI Today's ABI Today's TBI Previous ABI Previous TBI  +-------+-----------+-----------+------------+------------+  Right   0.33        0                                      +-------+-----------+-----------+------------+------------+  Left    0.74        0.33                                   +-------+-----------+-----------+------------+------------+  Summary: Right: Resting  right ankle-brachial index indicates severe right lower extremity arterial disease.  The right toe-brachial index is abnormal. Unable to obtain TBI due to low amplitude waveforms. Left: Resting left ankle-brachial index indicates moderate left lower extremity arterial disease. The left toe-brachial index is abnormal.  *See table(s) above for measurements and observations.  Electronically signed by Monica Martinez MD on 06/18/2021 at 5:14:25 PM.    Final     Scheduled Meds:  amLODipine  5 mg Oral Daily   docusate sodium  100 mg Oral BID   hydrochlorothiazide  25 mg Oral Daily   ibrutinib  420 mg Oral Daily   insulin aspart  0-5 Units Subcutaneous QHS   insulin aspart  0-9 Units Subcutaneous TID WC   irbesartan  300 mg Oral Daily   potassium chloride  40 mEq Oral Once   sodium chloride flush  3 mL Intravenous Q12H   terazosin  10 mg Oral QHS   Continuous Infusions:  heparin 1,900 Units/hr (06/19/21 0756)     LOS: 1 day   Shelly Coss, MD [facility] P3/02/2022, 11:15 AM

## 2021-06-19 NOTE — Progress Notes (Signed)
?  Progress Note ? ? ? ?06/19/2021 ?1:57 PM ?* No surgery date entered * ? ?Subjective: No overnight issues, persistent right foot pain ? ?Vitals:  ? 06/19/21 0823 06/19/21 1124  ?BP: (!) 153/77 (!) 146/67  ?Pulse: 82   ?Resp: 11   ?Temp: 98.1 ?F (36.7 ?C)   ?SpO2: 95%   ? ? ?Physical Exam: ?Awake and alert ?Palpable left pedal pulses no right pulses palpable in the common femoral ? ?CBC ?   ?Component Value Date/Time  ? WBC 11.6 (H) 06/19/2021 6226  ? RBC 5.00 06/19/2021 0338  ? HGB 14.8 06/19/2021 0338  ? HGB 14.9 05/04/2021 0752  ? HCT 44.5 06/19/2021 0338  ? PLT 543 (H) 06/19/2021 0338  ? PLT 352 05/04/2021 0752  ? MCV 89.0 06/19/2021 0338  ? MCH 29.6 06/19/2021 0338  ? MCHC 33.3 06/19/2021 0338  ? RDW 15.4 06/19/2021 0338  ? LYMPHSABS 2.2 06/18/2021 1044  ? MONOABS 0.5 06/18/2021 1044  ? EOSABS 0.1 06/18/2021 1044  ? BASOSABS 0.1 06/18/2021 1044  ? ? ?BMET ?   ?Component Value Date/Time  ? NA 132 (L) 06/19/2021 0338  ? K 3.4 (L) 06/19/2021 0338  ? CL 96 (L) 06/19/2021 0338  ? CO2 27 06/19/2021 0338  ? GLUCOSE 143 (H) 06/19/2021 0338  ? BUN 19 06/19/2021 0338  ? CREATININE 0.87 06/19/2021 0338  ? CREATININE 0.94 05/04/2021 0752  ? CALCIUM 8.8 (L) 06/19/2021 0338  ? GFRNONAA >60 06/19/2021 0338  ? GFRNONAA >60 05/04/2021 0752  ? GFRAA >60 01/08/2020 0810  ? ? ?INR ?   ?Component Value Date/Time  ? INR 1.0 06/18/2021 1044  ? ? ? ?Intake/Output Summary (Last 24 hours) at 06/19/2021 1357 ?Last data filed at 06/19/2021 1100 ?Gross per 24 hour  ?Intake 342.4 ml  ?Output 1300 ml  ?Net -957.6 ml  ? ? ? ?Assessment/plan:  71 y.o. male is here with approximately 3 months of progressive right foot pain and discoloration now with cool right foot.  His ABIs 0.3 and toe pressures 0.  Plan is for angiography on Monday.  Continue heparin drip ? ? ? ?Rosibel Giacobbe C. Donzetta Matters, MD ?[facility] ?Office: [phone] ?Pager: [phone] ? ?06/19/2021 ?1:57 PM ? ?

## 2021-06-19 NOTE — Progress Notes (Signed)
ANTICOAGULATION CONSULT NOTE - Follow-up Consult ? ?Pharmacy Consult for heparin infusion ? ?No Known Allergies ? ?Patient Measurements: ?Height: 6' (182.9 cm) ?Weight: 102.1 kg (225 lb) ?IBW/kg (Calculated) : 77.6 ?Heparin Dosing Weight: 98.5 ? ?Vital Signs: ?Temp: 97.7 ?F (36.5 ?C) (03/11 0543) ?Temp Source: Oral (03/11 0543) ?BP: 139/72 (03/11 0543) ?Pulse Rate: 82 (03/11 0543) ? ?Labs: ?Recent Labs  ?  06/18/21 ?1044 06/18/21 ?2033 06/19/21 ?1017  ?HGB 15.7  --  14.8  ?HCT 47.8  --  44.5  ?PLT 540*  --  543*  ?LABPROT 13.4  --   --   ?INR 1.0  --   --   ?HEPARINUNFRC  --  0.10* <0.10*  ?CREATININE 0.88  --  0.87  ? ? ? ?Estimated Creatinine Clearance: 94.9 mL/min (by C-G formula based on SCr of 0.87 mg/dL). ? ? ?Medical History: ?Past Medical History:  ?Diagnosis Date  ? BPH (benign prostatic hyperplasia)   ? CLL (chronic lymphocytic leukemia) ([place])   ? Hypertension   ? Pacemaker   ? ? ?Assessment: ?71 yo M sent to ED by podiatry for increased swelling and diminished pulses in R foot. Pt has a history of BPH, CLL, and HTN. Pt is not on any anticoagulation at home. Vascular surgery consulted to evaluate RLE. Pharmacy consulted to initiate and manage heparin infusion for RLE limb ischemia.  ? ?Heparin level undetectable s/p bolus and with heparin running at 1600 units/hour. No issues with infusion per RN. RN states night nurse reported some redness in the urine, but none noted at this time. Phlebotomy unsure of how lab was drawn as it was drawn early in the morning prior to their shift. CBC stable/ WNL. PLT elevated.  ? ?Goal of Therapy:  ?Heparin level 0.3-0.7 units/ml ?Monitor platelets by anticoagulation protocol: Yes ?  ?Plan:  ?Bolus heparin 2500 units x1  ?Increase heparin infusion to 1,900 units/hour  ?Check anti-xa level in 8 hours anddaily while on heparin  ?Monitor CBC, s/sx of bleeding  ? ?Adria Dill, PharmD ?PGY-1 Acute Care Resident  ?06/19/2021 7:39 AM  ? ? ? ?

## 2021-06-20 DIAGNOSIS — I70221 Atherosclerosis of native arteries of extremities with rest pain, right leg: Secondary | ICD-10-CM | POA: Diagnosis not present

## 2021-06-20 LAB — BASIC METABOLIC PANEL
Anion gap: 12 (ref 5–15)
BUN: 16 mg/dL (ref 8–23)
CO2: 23 mmol/L (ref 22–32)
Calcium: 8.7 mg/dL — ABNORMAL LOW (ref 8.9–10.3)
Chloride: 94 mmol/L — ABNORMAL LOW (ref 98–111)
Creatinine, Ser: 0.84 mg/dL (ref 0.61–1.24)
GFR, Estimated: >60 mL/min (ref >60)
Glucose, Bld: 142 mg/dL — ABNORMAL HIGH (ref 70–99)
Potassium: 3.8 mmol/L (ref 3.5–5.1)
Sodium: 129 mmol/L — ABNORMAL LOW (ref 135–145)

## 2021-06-20 LAB — CBC
HCT: 45.5 % (ref 39.0–52.0)
Hemoglobin: 14.7 g/dL (ref 13.0–17.0)
MCH: 28.8 pg (ref 26.0–34.0)
MCHC: 32.3 g/dL (ref 30.0–36.0)
MCV: 89 fL (ref 80.0–100.0)
Platelets: 496 10*3/uL — ABNORMAL HIGH (ref 150–400)
RBC: 5.11 MIL/uL (ref 4.22–5.81)
RDW: 15.1 % (ref 11.5–15.5)
WBC: 12.6 10*3/uL — ABNORMAL HIGH (ref 4.0–10.5)
nRBC: 0 % (ref 0.0–0.2)

## 2021-06-20 LAB — GLUCOSE, CAPILLARY
Glucose-Capillary: 166 mg/dL — ABNORMAL HIGH (ref 70–99)
Glucose-Capillary: 141 mg/dL — ABNORMAL HIGH (ref 70–99)
Glucose-Capillary: 126 mg/dL — ABNORMAL HIGH (ref 70–99)
Glucose-Capillary: 131 mg/dL — ABNORMAL HIGH (ref 70–99)

## 2021-06-20 LAB — HEPARIN LEVEL (UNFRACTIONATED)
Heparin Unfractionated: 0.17 IU/mL — ABNORMAL LOW (ref 0.30–0.70)
Heparin Unfractionated: 0.38 IU/mL (ref 0.30–0.70)
Heparin Unfractionated: 0.48 IU/mL (ref 0.30–0.70)

## 2021-06-20 MED ORDER — ADULT MULTIVITAMIN W/MINERALS CH
1.0000 | ORAL_TABLET | Freq: Every day | ORAL | Status: DC
  Administered 2021-06-20 – 2021-07-01 (×10): 1 via ORAL
  Filled 2021-06-20 (×10): qty 1

## 2021-06-20 MED ORDER — ENSURE ENLIVE PO LIQD
237.0000 mL | Freq: Two times a day (BID) | ORAL | Status: DC
  Administered 2021-06-20: 237 mL via ORAL

## 2021-06-20 MED ORDER — HEPARIN BOLUS VIA INFUSION
3000.0000 [IU] | Freq: Once | INTRAVENOUS | Status: AC
  Administered 2021-06-20: 3000 [IU] via INTRAVENOUS
  Filled 2021-06-20: qty 3000

## 2021-06-20 NOTE — Progress Notes (Addendum)
Initial Nutrition Assessment ? ?DOCUMENTATION CODES:  ?Not applicable ? ?INTERVENTION:  ?Liberalize to carb modified, encourage PO intake ?Ensure Enlive po BID, each supplement provides 350 kcal and 20 grams of protein. ?MVI with minerals daily ?Request new weight ? ?NUTRITION DIAGNOSIS:  ?Inadequate oral intake related to decreased appetite as evidenced by meal completion < 50%. ? ?GOAL:  ?Patient will meet greater than or equal to 90% of their needs ? ?MONITOR:  ?PO intake, Supplement acceptance, Labs, Skin ? ?REASON FOR ASSESSMENT:  ?Consult ?Wound healing ? ?ASSESSMENT:  ?71 y.o. male with history of CLL, chronic fascitis, DM type 2, and HTN sent to ED from podiatry appoint for concerns of ischemia to the right foot.  ? ?Noted that Vascular surgery consulted and planning aortogram and lower extremity arteriogram 3/13. Pt to be NPO at midnight for procedure.  ? ?Limited meal intake recorded at this time but average intake <50% consumed. Variable weight hx, appears to have gained over the last 12 months but acute weight loss in the last month. Will request new wt to monitor for accuracy.  ? ?Will liberalize diet in the setting of poor intake and add nutrition supplements to augment intake. Consulted for wound healing - no open areas present at this time. Will monitor for improvement in blood flow to RLE or if additional procedures will be required.  ? ?Average Meal Intake: ?3/10-3/12: 40% average intake x 2 recorded meals ? ?Nutritionally Relevant Medications: ?Scheduled Meds: ? docusate sodium  100 mg Oral BID  ? hydrochlorothiazide  25 mg Oral Daily  ? ibrutinib  420 mg Oral Daily  ? insulin aspart  0-5 Units Subcutaneous QHS  ? insulin aspart  0-9 Units Subcutaneous TID WC  ? ?PRN Meds: bisacodyl, ondansetron, polyethylene glycol, prochlorperazine ? ?Labs Reviewed: ?Sodium 129 / chloride 94 ?CBG ranges from 127-166 mg/dL over the last 24 hours ?HgbA1c 6.7% (01/16/21) ? ?NUTRITION - FOCUSED PHYSICAL EXAM: ?Defer  to in-person assessment ? ?Diet Order:   ?Diet Order   ? ?       ?  Diet NPO time specified Except for: Sips with Meds  Diet effective midnight       ?  ?  Diet Heart Room service appropriate? Yes; Fluid consistency: Thin  Diet effective now       ?  ? ?  ?  ? ?  ? ? ?EDUCATION NEEDS:  ?No education needs have been identified at this time ? ?Skin:  Skin Assessment: Reviewed RN Assessment ? ?Last BM:  3/10 ? ?Height:  ?Ht Readings from Last 1 Encounters:  ?06/18/21 6' (1.829 m)  ? ? ?Weight:  ?Wt Readings from Last 1 Encounters:  ?06/18/21 102.1 kg  ? ? ?Ideal Body Weight:  80.9 kg ? ?BMI:  Body mass index is 30.52 kg/m?. ? ?Estimated Nutritional Needs:  ?Kcal:  2300-2500 kcal/d ?Protein:  120-130g/d ?Fluid:  2.4-2.5 L/d ? ? ?Ranell Patrick, RD, LDN ?Clinical Dietitian ?RD pager # available in [place]  ?After hours/weekend pager # available in [place] ?

## 2021-06-20 NOTE — Progress Notes (Addendum)
PROGRESS NOTE  Douglas [name]  HMC:[id number] DOB: 27-Jun-1950 DOA: 06/18/2021 PCP: [name], MD   Brief Narrative: Patient is a 71 year old male with history of BPH, CLL, hypertension who presented with right foot swelling, purple discoloration, pain, inability to ambulate.  On presentation he was found to have ischemic right lower extremity, started on heparin drip.  Vascular surgery consulted and planning for angiography on Monday.    Assessment & Plan:  Principal Problem:   Critical limb ischemia of right lower extremity (HCC) Active Problems:   CLL (chronic lymphocytic leukemia) (HCC)   Hypertension   Diabetes mellitus (HCC)   BPH (benign prostatic hyperplasia)   Pacemaker   Assessment and Plan: * Critical limb ischemia of right lower extremity ([place]) -Patient with h/o plantar fasciitis, last received injection for this on 2/17 -Within the next 1-2 weeks, he developed foot swelling, pain, discoloration -Upon arrival in the podiatry office, there was concern for limb ischemia and he was sent to the ER -Negative lactate -He was started on heparin infusion -ABIs showed severe right lower extremity arterial disease, moderate left lower extremity arterial disease -Angiogram is scheduled for Monday with Dr. Donzetta Matters -Consults include peripheral vascular navigator; [facility] team; and nutrition  Pacemaker -High grade heart block -Pacer in place and working well in January per [name] -[name] was performed in 01/2021 pre-procedure with no obvious obstructive CAD  BPH (benign prostatic hyperplasia) -continue terazosin  Diabetes mellitus ([place]) -Last A1c was 6.7 -He is not on medications at home -Continue with sensitive-scale SSI  Hypertension -Continue Diovan HCT, amlodipine, and terazosin -Continue prn IV hydralazine  CLL (chronic lymphocytic leukemia) ([place]) -Diagnosed in 11/2018.   -Has done well with ibrutinib, and is continuing this medication. -Followed by Dr.  [name]. -Will continue medication but his wife will need to bring in form home. -This condition does predispose him to thromboembolic disease           DVT prophylaxis:IV heparin     Code Status: Full Code  Family Communication: Called and discussed with wife on phone on 3/12  Patient status:Inpatient  Patient is from :Home  Anticipated discharge MO:QHUT  Estimated DC date:1-2 days   Consultants: Vascular surgery  Procedures:None   Antimicrobials:  Anti-infectives (From admission, onward)    None       Subjective:  Patient seen and examined at the bedside this morning.  Hemodynamically stable.  Overall comfortable.  Nausea and pain are better controlled today.   Objective: Vitals:   06/19/21 1607 06/19/21 2124 06/20/21 0404 06/20/21 0756  BP: (!) 150/66 (!) 159/73 (!) 141/78 (!) 144/72  Pulse: 81 88 (!) 104   Resp: 16   15  Temp: 98.2 F (36.8 C) 97.8 F (36.6 C) 98 F (36.7 C) 98.2 F (36.8 C)  TempSrc: Oral Oral Oral Oral  SpO2: 96% 94% 92% 95%  Weight:      Height:        Intake/Output Summary (Last 24 hours) at 06/20/2021 1037 Last data filed at 06/20/2021 0759 Gross per 24 hour  Intake 200 ml  Output 900 ml  Net -700 ml   Filed Weights   06/18/21 1200  Weight: 102.1 kg    Examination:   General exam: Overall comfortable, not in distress HEENT: PERRL Respiratory system:  no wheezes or crackles  Cardiovascular system: S1 & S2 heard, RRR.  Gastrointestinal system: Abdomen is nondistended, soft and nontender. Central nervous system: Alert and oriented Extremities: Bluish discoloration, edema, cool right foot  skin: No  rashes, no ulcers,no icterus     Data Reviewed: I have personally reviewed following labs and imaging studies  CBC: Recent Labs  Lab 06/18/21 1044 06/19/21 0338 06/20/21 0020  WBC 11.3* 11.6* 12.6*  NEUTROABS 8.4*  --   --   HGB 15.7 14.8 14.7  HCT 47.8 44.5 45.5  MCV 90.0 89.0 89.0  PLT 540* 543* 496*    Basic Metabolic Panel: Recent Labs  Lab 06/18/21 1044 06/19/21 0338 06/20/21 0020  NA 136 132* 129*  K 3.8 3.4* 3.8  CL 98 96* 94*  CO2 '27 27 23  '$ GLUCOSE 135* 143* 142*  BUN '22 19 16  '$ CREATININE 0.88 0.87 0.84  CALCIUM 8.9 8.8* 8.7*     Recent Results (from the past 240 hour(s))  Resp Panel by RT-PCR (Flu A&B, Covid) Nasopharyngeal Swab     Status: None   Collection Time: 06/18/21 10:44 AM   Specimen: Nasopharyngeal Swab; Nasopharyngeal(NP) swabs in vial transport medium  Result Value Ref Range Status   SARS Coronavirus 2 by RT PCR NEGATIVE NEGATIVE Final    Comment: (NOTE) SARS-CoV-2 target nucleic acids are NOT DETECTED.  The SARS-CoV-2 RNA is generally detectable in upper respiratory specimens during the acute phase of infection. The lowest concentration of SARS-CoV-2 viral copies this assay can detect is 138 copies/mL. A negative result does not preclude SARS-Cov-2 infection and should not be used as the sole basis for treatment or other patient management decisions. A negative result may occur with  improper specimen collection/handling, submission of specimen other than nasopharyngeal swab, presence of viral mutation(s) within the areas targeted by this assay, and inadequate number of viral copies(<138 copies/mL). A negative result must be combined with clinical observations, patient history, and epidemiological information. The expected result is Negative.  Fact Sheet for Patients:  EntrepreneurPulse.com.au  Fact Sheet for Healthcare Providers:  IncredibleEmployment.be  This test is no t yet approved or cleared by the [place] FDA and  has been authorized for detection and/or diagnosis of SARS-CoV-2 by FDA under an Emergency Use Authorization (EUA). This EUA will remain  in effect (meaning this test can be used) for the duration of the COVID-19 declaration under Section 564(b)(1) of the Act, 21 U.S.C.section  360bbb-3(b)(1), unless the authorization is terminated  or revoked sooner.       Influenza A by PCR NEGATIVE NEGATIVE Final   Influenza B by PCR NEGATIVE NEGATIVE Final    Comment: (NOTE) The Xpert Xpress SARS-CoV-2/FLU/RSV plus assay is intended as an aid in the diagnosis of influenza from Nasopharyngeal swab specimens and should not be used as a sole basis for treatment. Nasal washings and aspirates are unacceptable for Xpert Xpress SARS-CoV-2/FLU/RSV testing.  Fact Sheet for Patients: EntrepreneurPulse.com.au  Fact Sheet for Healthcare Providers: IncredibleEmployment.be  This test is not yet approved or cleared by the [place] FDA and has been authorized for detection and/or diagnosis of SARS-CoV-2 by FDA under an Emergency Use Authorization (EUA). This EUA will remain in effect (meaning this test can be used) for the duration of the COVID-19 declaration under Section 564(b)(1) of the Act, 21 U.S.C. section 360bbb-3(b)(1), unless the authorization is terminated or revoked.  Performed at [facility], [place] [address] [place], [place] [address]      Radiology Studies: VAS [place] ABI WITH/WO TBI  Result Date: 06/18/2021  LOWER EXTREMITY DOPPLER STUDY Patient Name:  ARTHUR SPEAGLE  Date of Exam:   06/18/2021 Medical Rec #: [id number]  Accession #:    [id number] Date of Birth: 28-Oct-1950          Patient Gender: M Patient Age:   60 years Exam Location:  [facility] Procedure:      VAS [place] ABI WITH/WO TBI Referring Phys: ROBERT LOCKWOOD --------------------------------------------------------------------------------  Indications: Claudication, rest pain, and peripheral artery disease. High Risk Factors: Hypertension, Diabetes.  Comparison Study: No prior studies. Performing Technologist: Carlos Levering RVT  Examination Guidelines: A complete evaluation includes at minimum, Doppler waveform signals and systolic blood pressure  reading at the level of bilateral brachial, anterior tibial, and posterior tibial arteries, when vessel segments are accessible. Bilateral testing is considered an integral part of a complete examination. Photoelectric Plethysmograph (PPG) waveforms and toe systolic pressure readings are included as required and additional duplex testing as needed. Limited examinations for reoccurring indications may be performed as noted.  ABI Findings: +---------+------------------+-----+----------+--------+  Right     Rt Pressure (mmHg) Index Waveform   Comment   +---------+------------------+-----+----------+--------+  Brachial  160                      triphasic            +---------+------------------+-----+----------+--------+  PTA       35                 0.22  monophasic           +---------+------------------+-----+----------+--------+  DP        53                 0.33  monophasic           +---------+------------------+-----+----------+--------+  Great Toe 0                  0.00                       +---------+------------------+-----+----------+--------+ +---------+------------------+-----+---------+-------+  Left      Lt Pressure (mmHg) Index Waveform  Comment  +---------+------------------+-----+---------+-------+  Brachial  153                      triphasic          +---------+------------------+-----+---------+-------+  PTA       118                0.74  biphasic           +---------+------------------+-----+---------+-------+  DP        114                0.71  biphasic           +---------+------------------+-----+---------+-------+  Great Toe 52                 0.32                     +---------+------------------+-----+---------+-------+ +-------+-----------+-----------+------------+------------+  ABI/TBI Today's ABI Today's TBI Previous ABI Previous TBI  +-------+-----------+-----------+------------+------------+  Right   0.33        0                                       +-------+-----------+-----------+------------+------------+  Left    0.74        0.33                                   +-------+-----------+-----------+------------+------------+  Summary: Right: Resting right ankle-brachial index indicates severe right lower extremity arterial disease. The right toe-brachial index is abnormal. Unable to obtain TBI due to low amplitude waveforms. Left: Resting left ankle-brachial index indicates moderate left lower extremity arterial disease. The left toe-brachial index is abnormal.  *See table(s) above for measurements and observations.  Electronically signed by Monica Martinez MD on 06/18/2021 at 5:14:25 PM.    Final     Scheduled Meds:  amLODipine  5 mg Oral Daily   docusate sodium  100 mg Oral BID   hydrochlorothiazide  25 mg Oral Daily   ibrutinib  420 mg Oral Daily   insulin aspart  0-5 Units Subcutaneous QHS   insulin aspart  0-9 Units Subcutaneous TID WC   irbesartan  300 mg Oral Daily   sodium chloride flush  3 mL Intravenous Q12H   terazosin  10 mg Oral QHS   Continuous Infusions:  heparin 2,250 Units/hr (06/20/21 0311)     LOS: 2 days   Shelly Coss, MD [facility] P3/03/2022, 10:37 AM

## 2021-06-20 NOTE — Progress Notes (Signed)
ANTICOAGULATION CONSULT NOTE - Follow-up Consult ? ?Pharmacy Consult for heparin infusion ? ?No Known Allergies ? ?Patient Measurements: ?Height: 6' (182.9 cm) ?Weight: 102.1 kg (225 lb) ?IBW/kg (Calculated) : 77.6 ?Heparin Dosing Weight: 98.5 ? ?Vital Signs: ?Temp: 98.2 ?F (36.8 ?C) (03/12 0756) ?Temp Source: Oral (03/12 0756) ?BP: 144/72 (03/12 0756) ? ?Labs: ?Recent Labs  ?  06/18/21 ?1044 06/18/21 ?2033 06/19/21 ?9622 06/19/21 ?1525 06/20/21 ?0020 06/20/21 ?0740 06/20/21 ?1548  ?HGB 15.7  --  14.8  --  14.7  --   --   ?HCT 47.8  --  44.5  --  45.5  --   --   ?PLT 540*  --  543*  --  496*  --   --   ?LABPROT 13.4  --   --   --   --   --   --   ?INR 1.0  --   --   --   --   --   --   ?HEPARINUNFRC  --    < > <0.10*   < > 0.17* 0.48 0.38  ?CREATININE 0.88  --  0.87  --  0.84  --   --   ? < > = values in this interval not displayed.  ? ? ? ?Estimated Creatinine Clearance: 98.3 mL/min (by C-G formula based on SCr of 0.84 mg/dL). ? ? ?Medical History: ?Past Medical History:  ?Diagnosis Date  ? BPH (benign prostatic hyperplasia)   ? CLL (chronic lymphocytic leukemia) ([place])   ? Hypertension   ? Pacemaker   ? ? ?Assessment: ?71 yo M sent to ED by podiatry for increased swelling and diminished pulses in R foot. Pt has a history of BPH, CLL, and HTN. Pt is not on any anticoagulation at home. Vascular surgery consulted to evaluate RLE. Pharmacy consulted to initiate and manage heparin infusion for RLE limb ischemia.  ? ?Heparin level remains therapeutic on confirmatory level at 0.38. ? ? ?Goal of Therapy:  ?Heparin level 0.3-0.7 units/ml ?Monitor platelets by anticoagulation protocol: Yes ?  ?Plan:  ?Continue heparin 2250 units/h ?Daily heparin level ? ?Arrie Senate, PharmD, BCPS, BCCP ?Clinical Pharmacist ?[phone] ?Please check AMION for all [place] numbers ?06/20/2021 ? ? ? ? ?

## 2021-06-20 NOTE — Progress Notes (Signed)
ANTICOAGULATION CONSULT NOTE - Follow-up Consult ? ?Pharmacy Consult for heparin infusion ? ?No Known Allergies ? ?Patient Measurements: ?Height: 6' (182.9 cm) ?Weight: 102.1 kg (225 lb) ?IBW/kg (Calculated) : 77.6 ?Heparin Dosing Weight: 98.5 ? ?Vital Signs: ?Temp: 98.2 ?F (36.8 ?C) (03/12 0756) ?Temp Source: Oral (03/12 0756) ?BP: 144/72 (03/12 0756) ?Pulse Rate: 104 (03/12 0404) ? ?Labs: ?Recent Labs  ?  06/18/21 ?1044 06/18/21 ?2033 06/19/21 ?8588 06/19/21 ?1525 06/20/21 ?0020 06/20/21 ?0740  ?HGB 15.7  --  14.8  --  14.7  --   ?HCT 47.8  --  44.5  --  45.5  --   ?PLT 540*  --  543*  --  496*  --   ?LABPROT 13.4  --   --   --   --   --   ?INR 1.0  --   --   --   --   --   ?HEPARINUNFRC  --    < > <0.10* 0.36 0.17* 0.48  ?CREATININE 0.88  --  0.87  --  0.84  --   ? < > = values in this interval not displayed.  ? ? ? ?Estimated Creatinine Clearance: 98.3 mL/min (by C-G formula based on SCr of 0.84 mg/dL). ? ? ?Medical History: ?Past Medical History:  ?Diagnosis Date  ? BPH (benign prostatic hyperplasia)   ? CLL (chronic lymphocytic leukemia) ([place])   ? Hypertension   ? Pacemaker   ? ? ?Assessment: ?71 yo M sent to ED by podiatry for increased swelling and diminished pulses in R foot. Pt has a history of BPH, CLL, and HTN. Pt is not on any anticoagulation at home. Vascular surgery consulted to evaluate RLE. Pharmacy consulted to initiate and manage heparin infusion for RLE limb ischemia.  ? ?Heparin level therapeutic x1 with heparin running at 2,250 units/hour. No issues with infusion or signs of bleeding noted per RN. CBC stable/ WNL. PLT elevated.  ? ?Goal of Therapy:  ?Heparin level 0.3-0.7 units/ml ?Monitor platelets by anticoagulation protocol: Yes ?  ?Plan:  ?Continue heparin infusion at 2,250 units/h  ?Check confirmatory anti-xa level in 8 hours and daily while on heparin  ?Monitor CBC, s/sx of bleeding  ? ?Adria Dill, PharmD ?PGY-1 Acute Care Resident  ?06/20/2021 8:46 AM  ? ? ? ?

## 2021-06-20 NOTE — Progress Notes (Signed)
ANTICOAGULATION CONSULT NOTE  ?Pharmacy Consult for heparin  ?Indication:  RLE ischemia ?Brief A/P: Heparin level subtherapeutic Increase Heparin rate ? ?No Known Allergies ? ?Patient Measurements: ?Height: 6' (182.9 cm) ?Weight: 102.1 kg (225 lb) ?IBW/kg (Calculated) : 77.6 ?Heparin Dosing Weight: 98.5 ? ?Vital Signs: ?Temp: 97.8 ?F (36.6 ?C) (03/11 2124) ?Temp Source: Oral (03/11 2124) ?BP: 159/73 (03/11 2124) ?Pulse Rate: 88 (03/11 2124) ? ?Labs: ?Recent Labs  ?  06/18/21 ?1044 06/18/21 ?2033 06/19/21 ?6222 06/19/21 ?1525 06/20/21 ?0020  ?HGB 15.7  --  14.8  --  14.7  ?HCT 47.8  --  44.5  --  45.5  ?PLT 540*  --  543*  --  496*  ?LABPROT 13.4  --   --   --   --   ?INR 1.0  --   --   --   --   ?HEPARINUNFRC  --    < > <0.10* 0.36 0.17*  ?CREATININE 0.88  --  0.87  --  0.84  ? < > = values in this interval not displayed.  ? ? ? ?Estimated Creatinine Clearance: 98.3 mL/min (by C-G formula based on SCr of 0.84 mg/dL). ? ? ?Assessment: ?71 y.o. male with RLE ischemia awaiting angiogram for heparin ? ?Goal of Therapy:  ?Heparin level 0.3-0.7 units/ml ?Monitor platelets by anticoagulation protocol: Yes ?  ?Plan:  ?Heparin 3000 units IV bolus, then increase heparin 2250 units/hr ?Check heparin level in 6 hours.   ? ?Phillis Knack, PharmD, BCPS ?   ? ? ? ? ?

## 2021-06-20 NOTE — Progress Notes (Signed)
Subjective:  ? ?Patient ID: Douglas [name], male   DOB: 71 y.o.   MRN: [id number]  ? ?HPI ?Patient presents stating his entire right foot is now hurting him and it seems to have discolored.  States this is occurred over the last week and he does not remember injury ? ? ?ROS ? ? ?   ?Objective:  ?Physical Exam  ?Vascular status is significantly compromised at this point and it appears there may be some type of clot formation or other mechanism that is causing acute loss of blood flow to the forefoot right.  The forefoot is cold there is no ulcerations but it is ischemic and very painful ? ?   ?Assessment:  ?Probability for some form of incident creating vascular shutdown with forefoot pathology and pain ? ?   ?Plan:  ?H&P discussed this with him in detail and I recommended going straight to the emergency room currently and being evaluated by vascular with the possibility for angiogram possibility for some form of revascularization.  Patient will be seen back by [place] after this is stabilized but this is what I would consider emergency situation and he was made fully aware of this ?   ? ? ?

## 2021-06-20 NOTE — H&P (View-Only) (Signed)
?  Progress Note ? ? ? ?06/20/2021 ?11:42 AM ?* No surgery date entered * ? ?Subjective:  Denies any acute issues ? ?Vitals:  ? 06/20/21 0404 06/20/21 0756  ?BP: (!) 141/78 (!) 144/72  ?Pulse: (!) 104   ?Resp:  15  ?Temp: 98 ?F (36.7 ?C) 98.2 ?F (36.8 ?C)  ?SpO2: 92% 95%  ? ? ?Physical Exam: ?Awake alert and oriented ?Palpable left-sided dorsalis pedis pulse no palpable pulses on the right lower extremity no Doppler available on 5 N. for evaluation. ? ?CBC ?   ?Component Value Date/Time  ? WBC 12.6 (H) 06/20/2021 0020  ? RBC 5.11 06/20/2021 0020  ? HGB 14.7 06/20/2021 0020  ? HGB 14.9 05/04/2021 0752  ? HCT 45.5 06/20/2021 0020  ? PLT 496 (H) 06/20/2021 0020  ? PLT 352 05/04/2021 0752  ? MCV 89.0 06/20/2021 0020  ? MCH 28.8 06/20/2021 0020  ? MCHC 32.3 06/20/2021 0020  ? RDW 15.1 06/20/2021 0020  ? LYMPHSABS 2.2 06/18/2021 1044  ? MONOABS 0.5 06/18/2021 1044  ? EOSABS 0.1 06/18/2021 1044  ? BASOSABS 0.1 06/18/2021 1044  ? ? ?BMET ?   ?Component Value Date/Time  ? NA 129 (L) 06/20/2021 0020  ? K 3.8 06/20/2021 0020  ? CL 94 (L) 06/20/2021 0020  ? CO2 23 06/20/2021 0020  ? GLUCOSE 142 (H) 06/20/2021 0020  ? BUN 16 06/20/2021 0020  ? CREATININE 0.84 06/20/2021 0020  ? CREATININE 0.94 05/04/2021 0752  ? CALCIUM 8.7 (L) 06/20/2021 0020  ? GFRNONAA >60 06/20/2021 0020  ? GFRNONAA >60 05/04/2021 0752  ? GFRAA >60 01/08/2020 0810  ? ? ?INR ?   ?Component Value Date/Time  ? INR 1.0 06/18/2021 1044  ? ? ? ?Intake/Output Summary (Last 24 hours) at 06/20/2021 1142 ?Last data filed at 06/20/2021 1052 ?Gross per 24 hour  ?Intake 120 ml  ?Output 1200 ml  ?Net -1080 ml  ? ? ? ?Assessment:  71 y.o. male is here with acute on chronic right lower extremity rest pain and ischemic right foot with ABI of 0.3 and toe pressure of 0.  Sensorimotor intact with heparin drip ? ?Plan: ?Angiography tomorrow from left common femoral approach.  Continue heparin drip ? ?Caydon Feasel C. Donzetta Matters, MD ?[facility] ?Office:  [phone] ?Pager: [phone] ? ?06/20/2021 ?11:42 AM ? ?

## 2021-06-20 NOTE — Progress Notes (Signed)
?  Progress Note ? ? ? ?06/20/2021 ?11:42 AM ?* No surgery date entered * ? ?Subjective:  Denies any acute issues ? ?Vitals:  ? 06/20/21 0404 06/20/21 0756  ?BP: (!) 141/78 (!) 144/72  ?Pulse: (!) 104   ?Resp:  15  ?Temp: 98 ?F (36.7 ?C) 98.2 ?F (36.8 ?C)  ?SpO2: 92% 95%  ? ? ?Physical Exam: ?Awake alert and oriented ?Palpable left-sided dorsalis pedis pulse no palpable pulses on the right lower extremity no Doppler available on 5 N. for evaluation. ? ?CBC ?   ?Component Value Date/Time  ? WBC 12.6 (H) 06/20/2021 0020  ? RBC 5.11 06/20/2021 0020  ? HGB 14.7 06/20/2021 0020  ? HGB 14.9 05/04/2021 0752  ? HCT 45.5 06/20/2021 0020  ? PLT 496 (H) 06/20/2021 0020  ? PLT 352 05/04/2021 0752  ? MCV 89.0 06/20/2021 0020  ? MCH 28.8 06/20/2021 0020  ? MCHC 32.3 06/20/2021 0020  ? RDW 15.1 06/20/2021 0020  ? LYMPHSABS 2.2 06/18/2021 1044  ? MONOABS 0.5 06/18/2021 1044  ? EOSABS 0.1 06/18/2021 1044  ? BASOSABS 0.1 06/18/2021 1044  ? ? ?BMET ?   ?Component Value Date/Time  ? NA 129 (L) 06/20/2021 0020  ? K 3.8 06/20/2021 0020  ? CL 94 (L) 06/20/2021 0020  ? CO2 23 06/20/2021 0020  ? GLUCOSE 142 (H) 06/20/2021 0020  ? BUN 16 06/20/2021 0020  ? CREATININE 0.84 06/20/2021 0020  ? CREATININE 0.94 05/04/2021 0752  ? CALCIUM 8.7 (L) 06/20/2021 0020  ? GFRNONAA >60 06/20/2021 0020  ? GFRNONAA >60 05/04/2021 0752  ? GFRAA >60 01/08/2020 0810  ? ? ?INR ?   ?Component Value Date/Time  ? INR 1.0 06/18/2021 1044  ? ? ? ?Intake/Output Summary (Last 24 hours) at 06/20/2021 1142 ?Last data filed at 06/20/2021 1052 ?Gross per 24 hour  ?Intake 120 ml  ?Output 1200 ml  ?Net -1080 ml  ? ? ? ?Assessment:  71 y.o. male is here with acute on chronic right lower extremity rest pain and ischemic right foot with ABI of 0.3 and toe pressure of 0.  Sensorimotor intact with heparin drip ? ?Plan: ?Angiography tomorrow from left common femoral approach.  Continue heparin drip ? ?Ottie Tillery C. Donzetta Matters, MD ?[facility] ?Office:  [phone] ?Pager: [phone] ? ?06/20/2021 ?11:42 AM ? ?

## 2021-06-21 ENCOUNTER — Inpatient Hospital Stay (HOSPITAL_COMMUNITY): Payer: 59

## 2021-06-21 ENCOUNTER — Encounter (HOSPITAL_COMMUNITY)
Admission: EM | Disposition: A | Discharge status: Rehabilitation Facility | Payer: Self-pay | Source: Home / Self Care | Attending: Internal Medicine

## 2021-06-21 ENCOUNTER — Encounter (HOSPITAL_COMMUNITY): Payer: Self-pay | Admitting: Vascular Surgery

## 2021-06-21 DIAGNOSIS — I739 Peripheral vascular disease, unspecified: Secondary | ICD-10-CM

## 2021-06-21 DIAGNOSIS — I70221 Atherosclerosis of native arteries of extremities with rest pain, right leg: Secondary | ICD-10-CM

## 2021-06-21 HISTORY — PX: ABDOMINAL AORTOGRAM W/LOWER EXTREMITY: CATH118223

## 2021-06-21 LAB — CBC
HCT: 42.9 % (ref 39.0–52.0)
Hemoglobin: 14.7 g/dL (ref 13.0–17.0)
MCH: 29.9 pg (ref 26.0–34.0)
MCHC: 34.3 g/dL (ref 30.0–36.0)
MCV: 87.2 fL (ref 80.0–100.0)
Platelets: 410 10*3/uL — ABNORMAL HIGH (ref 150–400)
RBC: 4.92 MIL/uL (ref 4.22–5.81)
RDW: 15.1 % (ref 11.5–15.5)
WBC: 12.1 10*3/uL — ABNORMAL HIGH (ref 4.0–10.5)
nRBC: 0 % (ref 0.0–0.2)

## 2021-06-21 LAB — BASIC METABOLIC PANEL
Anion gap: 11 (ref 5–15)
BUN: 20 mg/dL (ref 8–23)
CO2: 26 mmol/L (ref 22–32)
Calcium: 8.7 mg/dL — ABNORMAL LOW (ref 8.9–10.3)
Chloride: 94 mmol/L — ABNORMAL LOW (ref 98–111)
Creatinine, Ser: 0.86 mg/dL (ref 0.61–1.24)
Glucose, Bld: 141 mg/dL — ABNORMAL HIGH (ref 70–99)
Potassium: 3.2 mmol/L — ABNORMAL LOW (ref 3.5–5.1)
Sodium: 131 mmol/L — ABNORMAL LOW (ref 135–145)

## 2021-06-21 LAB — GLUCOSE, CAPILLARY
Glucose-Capillary: 155 mg/dL — ABNORMAL HIGH (ref 70–99)
Glucose-Capillary: 88 mg/dL (ref 70–99)
Glucose-Capillary: 162 mg/dL — ABNORMAL HIGH (ref 70–99)
Glucose-Capillary: 166 mg/dL — ABNORMAL HIGH (ref 70–99)

## 2021-06-21 LAB — SURGICAL PCR SCREEN
MRSA, PCR: NEGATIVE
Staphylococcus aureus: POSITIVE — AB

## 2021-06-21 LAB — HEPARIN LEVEL (UNFRACTIONATED): Heparin Unfractionated: 0.23 IU/mL — ABNORMAL LOW (ref 0.30–0.70)

## 2021-06-21 SURGERY — ABDOMINAL AORTOGRAM W/LOWER EXTREMITY
Anesthesia: LOCAL | Laterality: Bilateral

## 2021-06-21 MED ORDER — MIDAZOLAM HCL 2 MG/2ML IJ SOLN
INTRAMUSCULAR | Status: AC
  Filled 2021-06-21: qty 2

## 2021-06-21 MED ORDER — HEPARIN (PORCINE) IN NACL 1000-0.9 UT/500ML-% IV SOLN
INTRAVENOUS | Status: AC
  Filled 2021-06-21: qty 500

## 2021-06-21 MED ORDER — LIDOCAINE HCL (PF) 1 % IJ SOLN
INTRAMUSCULAR | Status: DC | PRN
  Administered 2021-06-21: 15 mL

## 2021-06-21 MED ORDER — SODIUM CHLORIDE 0.9 % IV SOLN: INTRAVENOUS | Status: AC

## 2021-06-21 MED ORDER — ONDANSETRON HCL 4 MG/2ML IJ SOLN
4.0000 mg | Freq: Four times a day (QID) | INTRAMUSCULAR | Status: DC | PRN
  Administered 2021-06-25: 4 mg via INTRAVENOUS
  Filled 2021-06-21: qty 2

## 2021-06-21 MED ORDER — FENTANYL CITRATE (PF) 100 MCG/2ML IJ SOLN
INTRAMUSCULAR | Status: DC | PRN
  Administered 2021-06-21: 50 ug via INTRAVENOUS

## 2021-06-21 MED ORDER — FENTANYL CITRATE (PF) 100 MCG/2ML IJ SOLN
INTRAMUSCULAR | Status: AC
  Filled 2021-06-21: qty 2

## 2021-06-21 MED ORDER — SODIUM CHLORIDE 0.9 % IV SOLN: INTRAVENOUS | Status: DC

## 2021-06-21 MED ORDER — SODIUM CHLORIDE 0.9 % IV SOLN: 250.0000 mL | INTRAVENOUS | Status: DC | PRN

## 2021-06-21 MED ORDER — HYDRALAZINE HCL 20 MG/ML IJ SOLN: 5.0000 mg | INTRAMUSCULAR | Status: DC | PRN

## 2021-06-21 MED ORDER — SODIUM CHLORIDE 0.9% FLUSH
3.0000 mL | Freq: Two times a day (BID) | INTRAVENOUS | Status: DC
  Administered 2021-06-22 – 2021-07-01 (×17): 3 mL via INTRAVENOUS

## 2021-06-21 MED ORDER — LABETALOL HCL 5 MG/ML IV SOLN
INTRAVENOUS | Status: AC
  Filled 2021-06-21: qty 4

## 2021-06-21 MED ORDER — ACETAMINOPHEN 325 MG PO TABS
650.0000 mg | ORAL_TABLET | ORAL | Status: DC | PRN
  Administered 2021-06-23 – 2021-06-24 (×2): 650 mg via ORAL
  Filled 2021-06-21: qty 2

## 2021-06-21 MED ORDER — ALUM & MAG HYDROXIDE-SIMETH 200-200-20 MG/5ML PO SUSP
30.0000 mL | Freq: Four times a day (QID) | ORAL | Status: DC | PRN
  Administered 2021-06-21: 30 mL via ORAL
  Filled 2021-06-21 (×2): qty 30

## 2021-06-21 MED ORDER — SODIUM CHLORIDE 0.9% FLUSH: 3.0000 mL | INTRAVENOUS | Status: DC | PRN

## 2021-06-21 MED ORDER — POTASSIUM CHLORIDE 10 MEQ/100ML IV SOLN
10.0000 meq | INTRAVENOUS | Status: AC
  Administered 2021-06-21 (×3): 10 meq via INTRAVENOUS
  Filled 2021-06-21: qty 100

## 2021-06-21 MED ORDER — MIDAZOLAM HCL 2 MG/2ML IJ SOLN
INTRAMUSCULAR | Status: DC | PRN
  Administered 2021-06-21: 2 mg via INTRAVENOUS

## 2021-06-21 MED ORDER — HEPARIN (PORCINE) IN NACL 1000-0.9 UT/500ML-% IV SOLN
INTRAVENOUS | Status: DC | PRN
  Administered 2021-06-21 (×2): 500 mL

## 2021-06-21 MED ORDER — HEPARIN (PORCINE) 25000 UT/250ML-% IV SOLN
2500.0000 [IU]/h | INTRAVENOUS | Status: DC
  Administered 2021-06-21: 2250 [IU]/h via INTRAVENOUS
  Administered 2021-06-22 – 2021-06-23 (×4): 2500 [IU]/h via INTRAVENOUS
  Filled 2021-06-21 (×4): qty 250

## 2021-06-21 MED ORDER — ROSUVASTATIN CALCIUM 5 MG PO TABS
10.0000 mg | ORAL_TABLET | Freq: Every day | ORAL | Status: DC
  Administered 2021-06-21: 15:00:00 10 mg via ORAL
  Filled 2021-06-21: qty 2

## 2021-06-21 MED ORDER — LABETALOL HCL 5 MG/ML IV SOLN: 10.0000 mg | INTRAVENOUS | Status: DC | PRN

## 2021-06-21 MED ORDER — POTASSIUM CHLORIDE 10 MEQ/100ML IV SOLN
10.0000 meq | INTRAVENOUS | Status: AC
  Administered 2021-06-21: 10 meq via INTRAVENOUS

## 2021-06-21 MED ORDER — ASPIRIN EC 81 MG PO TBEC
81.0000 mg | DELAYED_RELEASE_TABLET | Freq: Every day | ORAL | Status: DC
  Administered 2021-06-21 – 2021-07-01 (×9): 81 mg via ORAL
  Filled 2021-06-21 (×10): qty 1

## 2021-06-21 MED ORDER — IODIXANOL 320 MG/ML IV SOLN
INTRAVENOUS | Status: DC | PRN
  Administered 2021-06-21: 95 mL

## 2021-06-21 SURGICAL SUPPLY — 9 items
CATH OMNI FLUSH 5F 65CM (CATHETERS) ×2 IMPLANT
KIT MICROPUNCTURE NIT STIFF (SHEATH) ×2 IMPLANT
KIT PV (KITS) ×3 IMPLANT
SHEATH PINNACLE 5F 10CM (SHEATH) ×2 IMPLANT
SHEATH PROBE COVER 6X72 (BAG) ×2 IMPLANT
SYR MEDRAD MARK V 150ML (SYRINGE) ×2 IMPLANT
TRANSDUCER W/STOPCOCK (MISCELLANEOUS) ×3 IMPLANT
TRAY PV CATH (CUSTOM PROCEDURE TRAY) ×3 IMPLANT
WIRE BENTSON .035X145CM (WIRE) ×2 IMPLANT

## 2021-06-21 NOTE — Progress Notes (Signed)
ANTICOAGULATION CONSULT NOTE - Follow Up Consult ? ?Pharmacy Consult for Heparin ?Indication:  limb ischemia ? ?No Known Allergies ? ?Patient Measurements: ?Height: 6' (182.9 cm) ?Weight: 102.1 kg (225 lb) ?IBW/kg (Calculated) : 77.6 ?Heparin Dosing Weight: 98.5 kg ? ?Vital Signs: ?Temp: 98.1 ?F (36.7 ?C) (03/13 0801) ?Temp Source: Oral (03/13 0801) ?BP: 149/74 (03/13 0801) ?Pulse Rate: 91 (03/13 0801) ? ?Labs: ?Recent Labs  ?  06/18/21 ?1044 06/18/21 ?2033 06/19/21 ?9924 06/19/21 ?1525 06/20/21 ?0020 06/20/21 ?0740 06/20/21 ?1548 06/21/21 ?0401  ?HGB 15.7  --  14.8  --  14.7  --   --  14.7  ?HCT 47.8  --  44.5  --  45.5  --   --  42.9  ?PLT 540*  --  543*  --  496*  --   --  410*  ?LABPROT 13.4  --   --   --   --   --   --   --   ?INR 1.0  --   --   --   --   --   --   --   ?HEPARINUNFRC  --    < > <0.10*   < > 0.17* 0.48 0.38 0.23*  ?CREATININE 0.88  --  0.87  --  0.84  --   --  0.86  ? < > = values in this interval not displayed.  ? ? ?Estimated Creatinine Clearance: 96 mL/min (by C-G formula based on SCr of 0.86 mg/dL). ? ?Assessment: ?71 yo M sent to ED by podiatry for increased swelling and diminished pulses in R foot. Pt has a history of BPH, CLL, and HTN. Pt is not on any anticoagulation at home. Vascular surgery consulted to evaluate RLE. Pharmacy consulted to initiate and manage heparin infusion for RLE limb ischemia.  ? ? Heparin level is subtherapeutic (0.23) this morning on 2250 units/hr. Was therapeutic x 2 on 06/20/21 on same rate.  No known interruptions.  Aortogram is scheduled for 10:30am today. ? ?Goal of Therapy:  ?Heparin level 0.3-0.7 units/ml ?Monitor platelets by anticoagulation protocol: Yes ?  ?Plan:  ?Continue heparin drip at 2250 units/hr. ?Daily heparin level and CBC while on heparin. ?Follow up post-procedure. ? ?Arty Baumgartner, RPh ?06/21/2021,9:34 AM ? ? ?

## 2021-06-21 NOTE — Progress Notes (Signed)
Lower extremity vein mapping has been completed.  ? ?Preliminary results in CV Proc.  ? ?Nova Schmuhl Daryel Kenneth ?06/21/2021 1:28 PM    ?

## 2021-06-21 NOTE — Progress Notes (Signed)
59f sheath aspirated and removed from left femoral artery. Manual pressure applied for 20 minutes. Site level 0 , no  S+S  of hematoma. Tegaderm dressing applied, bedrest instructions given.  ? ?Left dp and pt palpable, right dp and pt absent. ? ? ?Bedrest begins at 13:00 ?

## 2021-06-21 NOTE — Progress Notes (Signed)
ANTICOAGULATION CONSULT NOTE - Follow Up Consult ? ?Pharmacy Consult for Heparin ?Indication:  limb ischemia ? ?No Known Allergies ? ?Patient Measurements: ?Height: 6' (182.9 cm) ?Weight: 102.1 kg (225 lb) ?IBW/kg (Calculated) : 77.6 ?Heparin Dosing Weight: 98.5 kg ? ?Vital Signs: ?Temp: 98 ?F (36.7 ?C) (03/13 1351) ?Temp Source: Oral (03/13 1351) ?BP: 143/73 (03/13 1351) ?Pulse Rate: 78 (03/13 1351) ? ?Labs: ?Recent Labs  ?  06/19/21 ?3614 06/19/21 ?1525 06/20/21 ?0020 06/20/21 ?0740 06/20/21 ?1548 06/21/21 ?0401  ?HGB 14.8  --  14.7  --   --  14.7  ?HCT 44.5  --  45.5  --   --  42.9  ?PLT 543*  --  496*  --   --  410*  ?HEPARINUNFRC <0.10*   < > 0.17* 0.48 0.38 0.23*  ?CREATININE 0.87  --  0.84  --   --  0.86  ? < > = values in this interval not displayed.  ? ? ? ?Estimated Creatinine Clearance: 96 mL/min (by C-G formula based on SCr of 0.86 mg/dL). ? ?Assessment: ?71 yo M sent to ED by podiatry for increased swelling and diminished pulses in R foot. Pt has a history of BPH, CLL, and HTN. Pt is not on any anticoagulation at home. Vascular surgery consulted to evaluate RLE. Pharmacy consulted to initiate and manage heparin infusion for RLE limb ischemia.  ? ? Heparin level is subtherapeutic (0.23) this morning on 2250 units/hr. Was therapeutic x 2 on 06/20/21 on same rate.  No known interruptions.  Aortogram is scheduled for 10:30am today. ? ?Now s/p procedure and pharmacy asked to resume heparin 4 hrs after sheath removal.  Was removed at 1304 PM.   ? ?Goal of Therapy:  ?Heparin level 0.3-0.7 units/ml ?Monitor platelets by anticoagulation protocol: Yes ?  ?Plan:  ?Restart heparin drip at 2250 units/hr at 1704 PM. ?Check heparin level 8 hrs after gtt restarted. ?Daily heparin level and CBC while on heparin. ?Follow up post-procedure. ? ?[place] Crane, Pharm D, BCPS, BCCP ?Clinical Pharmacist ? 06/21/2021 2:34 PM  ? ?[facility] pharmacy phone numbers are listed on [email or web address] ? ? ?

## 2021-06-21 NOTE — Plan of Care (Signed)
  Problem: Activity: Goal: Ability to return to baseline activity level will improve Outcome: Progressing   

## 2021-06-21 NOTE — Progress Notes (Addendum)
?PROGRESS NOTE ? ?Douglas [name]  NLG:[id number] DOB: November 29, 1950 DOA: 06/18/2021 ?PCP: [name], MD  ? ?Brief Narrative: ?Patient is a 71 year old male with history of BPH, CLL, hypertension who presented with right foot swelling, purple discoloration, pain, inability to ambulate.  On presentation he was found to have ischemic right lower extremity, started on heparin drip.  Vascular surgery consulted and planning for angiography today. ? ?Assessment & Plan: ? ?Principal Problem: ?  Critical limb ischemia of right lower extremity ([place]) ?Active Problems: ?  CLL (chronic lymphocytic leukemia) ([place]) ?  Hypertension ?  Diabetes mellitus ([place]) ?  BPH (benign prostatic hyperplasia) ?  Pacemaker ? ? ?Assessment and Plan: ?* Critical limb ischemia of right lower extremity ([place]) ?-Patient with h/o plantar fasciitis, last received injection for this on 2/17 ?-Within the next 1-2 weeks, he developed foot swelling, pain, discoloration ?-Upon arrival in the podiatry office, there was concern for limb ischemia and he was sent to the ER ?-Negative lactate ?-He was started on heparin infusion ?-ABIs showed severe right lower extremity arterial disease, moderate left lower extremity arterial disease ?-Angiogram is scheduled for Monday with Dr. Donzetta Matters ?-Consults include peripheral vascular navigator; [facility] team; and nutrition ? ?Pacemaker ?-High grade heart block ?-Pacer in place and working well in January per [name] ?-[name] was performed in 01/2021 pre-procedure with no obvious obstructive CAD ? ?BPH (benign prostatic hyperplasia) ?-continue terazosin ? ?Diabetes mellitus ([place]) ?-Last A1c was 6.7 ?-He is not on medications at home ?-Continue with sensitive-scale SSI ? ?Hypertension ?-Continue Diovan HCT, amlodipine, and terazosin ?-Continue prn IV hydralazine ? ?CLL (chronic lymphocytic leukemia) ([place]) ?-Diagnosed in 11/2018.   ?-Has done well with ibrutinib, and is continuing this medication. ?-Followed by Dr. [name]. ?-Will  continue medication but his wife will need to bring in form home. ?-This condition does predispose him to thromboembolic disease ? ? ? ? ? ?Nutrition Problem: Inadequate oral intake ?Etiology: decreased appetite ?  ? ?DVT prophylaxis:IV heparin ? ? ?  Code Status: Full Code ? ?Family Communication: Called and discussed with wife on phone on 3/12 ? ?Patient status:Inpatient ? ?Patient is from :Home ? ?Anticipated discharge YC:XKGY ? ?Estimated DC date:1-2 days,needs vascular surgery clearance ? ? ?Consultants: Vascular surgery ? ?Procedures:None  ? ?Antimicrobials:  ?Anti-infectives (From admission, onward)  ? ? None  ? ?  ? ? ?Subjective: ? ?Patient seen and examined at bedside this morning.  Hemodynamically stable.  Comfortable without any complaints today.  Waiting for vascular intervention ? ? ?Objective: ?Vitals:  ? 06/20/21 0404 06/20/21 0756 06/20/21 1959 06/21/21 0200  ?BP: (!) 141/78 (!) 144/72 (!) 145/98 (!) 157/73  ?Pulse: (!) 104   97  ?Resp:  15 19 (!) 21  ?Temp: 98 ?F (36.7 ?C) 98.2 ?F (36.8 ?C) 98.5 ?F (36.9 ?C) 98.4 ?F (36.9 ?C)  ?TempSrc: Oral Oral Oral Oral  ?SpO2: 92% 95% 92% 95%  ?Weight:      ?Height:      ? ? ?Intake/Output Summary (Last 24 hours) at 06/21/2021 0802 ?Last data filed at 06/20/2021 2328 ?Gross per 24 hour  ?Intake 1226 ml  ?Output 300 ml  ?Net 926 ml  ? ?Filed Weights  ? 06/18/21 1200  ?Weight: 102.1 kg  ? ? ?Examination: ? ? ?General exam: Overall comfortable, not in distress ?HEENT: PERRL ?Respiratory system:  no wheezes or crackles  ?Cardiovascular system: S1 & S2 heard, RRR.  ?Gastrointestinal system: Abdomen is nondistended, soft and nontender. ?Central nervous system: Alert and oriented ?Extremities: bluish right foot,cold  ?  Skin: No rashes, no ulcers,no icterus   ? ? ?Data Reviewed: I have personally reviewed following labs and imaging studies ? ?CBC: ?Recent Labs  ?Lab 06/18/21 ?1044 06/19/21 ?1194 06/20/21 ?0020 06/21/21 ?0401  ?WBC 11.3* 11.6* 12.6* 12.1*  ?NEUTROABS 8.4*   --   --   --   ?HGB 15.7 14.8 14.7 14.7  ?HCT 47.8 44.5 45.5 42.9  ?MCV 90.0 89.0 89.0 87.2  ?PLT 540* 543* 496* 410*  ? ?Basic Metabolic Panel: ?Recent Labs  ?Lab 06/18/21 ?1044 06/19/21 ?1740 06/20/21 ?0020 06/21/21 ?0401  ?NA 136 132* 129* 131*  ?K 3.8 3.4* 3.8 3.2*  ?CL 98 96* 94* 94*  ?CO2 '27 27 23 26  '$ ?GLUCOSE 135* 143* 142* 141*  ?BUN '22 19 16 20  '$ ?CREATININE 0.88 0.87 0.84 0.86  ?CALCIUM 8.9 8.8* 8.7* 8.7*  ? ? ? ?Recent Results (from the past 240 hour(s))  ?Resp Panel by RT-PCR (Flu A&B, Covid) Nasopharyngeal Swab     Status: None  ? Collection Time: 06/18/21 10:44 AM  ? Specimen: Nasopharyngeal Swab; Nasopharyngeal(NP) swabs in vial transport medium  ?Result Value Ref Range Status  ? SARS Coronavirus 2 by RT PCR NEGATIVE NEGATIVE Final  ?  Comment: (NOTE) ?SARS-CoV-2 target nucleic acids are NOT DETECTED. ? ?The SARS-CoV-2 RNA is generally detectable in upper respiratory ?specimens during the acute phase of infection. The lowest ?concentration of SARS-CoV-2 viral copies this assay can detect is ?138 copies/mL. A negative result does not preclude SARS-Cov-2 ?infection and should not be used as the sole basis for treatment or ?other patient management decisions. A negative result may occur with  ?improper specimen collection/handling, submission of specimen other ?than nasopharyngeal swab, presence of viral mutation(s) within the ?areas targeted by this assay, and inadequate number of viral ?copies(<138 copies/mL). A negative result must be combined with ?clinical observations, patient history, and epidemiological ?information. The expected result is Negative. ? ?Fact Sheet for Patients:  ?EntrepreneurPulse.com.au ? ?Fact Sheet for Healthcare Providers:  ?IncredibleEmployment.be ? ?This test is no t yet approved or cleared by the [place] FDA and  ?has been authorized for detection and/or diagnosis of SARS-CoV-2 by ?FDA under an Emergency Use Authorization (EUA). This  EUA will remain  ?in effect (meaning this test can be used) for the duration of the ?COVID-19 declaration under Section 564(b)(1) of the Act, 21 ?U.S.C.section 360bbb-3(b)(1), unless the authorization is terminated  ?or revoked sooner.  ? ? ?  ? Influenza A by PCR NEGATIVE NEGATIVE Final  ? Influenza B by PCR NEGATIVE NEGATIVE Final  ?  Comment: (NOTE) ?The Xpert Xpress SARS-CoV-2/FLU/RSV plus assay is intended as an aid ?in the diagnosis of influenza from Nasopharyngeal swab specimens and ?should not be used as a sole basis for treatment. Nasal washings and ?aspirates are unacceptable for Xpert Xpress SARS-CoV-2/FLU/RSV ?testing. ? ?Fact Sheet for Patients: ?EntrepreneurPulse.com.au ? ?Fact Sheet for Healthcare Providers: ?IncredibleEmployment.be ? ?This test is not yet approved or cleared by the [place] FDA and ?has been authorized for detection and/or diagnosis of SARS-CoV-2 by ?FDA under an Emergency Use Authorization (EUA). This EUA will remain ?in effect (meaning this test can be used) for the duration of the ?COVID-19 declaration under Section 564(b)(1) of the Act, 21 U.S.C. ?section 360bbb-3(b)(1), unless the authorization is terminated or ?revoked. ? ?Performed at [facility], [place] [address], [place], [place] ?[address] ?  ?  ? ?Radiology Studies: ?No results found. ? ?Scheduled Meds: ? amLODipine  5 mg Oral Daily  ?  docusate sodium  100 mg Oral BID  ? feeding supplement  237 mL Oral BID BM  ? hydrochlorothiazide  25 mg Oral Daily  ? ibrutinib  420 mg Oral Daily  ? insulin aspart  0-5 Units Subcutaneous QHS  ? insulin aspart  0-9 Units Subcutaneous TID WC  ? irbesartan  300 mg Oral Daily  ? multivitamin with minerals  1 tablet Oral Daily  ? sodium chloride flush  3 mL Intravenous Q12H  ? terazosin  10 mg Oral QHS  ? ?Continuous Infusions: ? heparin 2,250 Units/hr (06/21/21 0203)  ? potassium chloride    ? ? ? LOS: 3 days  ? ?Shelly Coss, MD ?[facility] ?P3/13/2023, 8:02 AM   ?

## 2021-06-21 NOTE — Op Note (Signed)
? ? ?  Patient name: Douglas [name] MRN: [id number] DOB: [birth year] Sex: male ? ?06/21/2021 ?Pre-operative Diagnosis: Critical right lower extremity ischemia with rest pain ?Post-operative diagnosis:  Same ?Surgeon:  Eda Paschal. Donzetta Matters, MD ?Procedure Performed: ?1.  Ultrasound-guided cannulation left common femoral artery ?2.  Aortogram  ?3.  Selection of right common femoral artery and right lower extremity angiogram ?4.  Left lower extremity angiogram ?5.  Moderate sedation with fentanyl and Versed for 27 minutes ? ?Indications: 71 year old male presented with right foot pain for 2 weeks.  He also has 2 to 3 months of claudication now with discoloration of the right foot and severely diminished ABIs on the right with mildly depressed ABIs on the left.  He is now indicated for angiography with possible intervention. ? ?Findings: The aorta and iliac segments are free of flow-limiting stenosis.  Bilateral renal arteries are patent.  Right lower extremity SFA occludes at the the takeoff there is no stump.  He reconstitutes above the knee where it is diseased and below the knee is healthier.  He has runoff dominant via the posterior tibial artery but this gives out at the level of the foot.  On the left side he has normal SFA proximally this occludes for approximately 12 cm in the distal SFA at the adductor but reconstitutes and has three-vessel runoff to the left foot. ? ?Patient will need right common femoral to below-knee popliteal artery bypass with tibial thrombectomy.  He remains at high risk for major amputation. ?  ?Procedure:  The patient was identified in the holding area and taken to room 8.  The patient was then placed supine on the table and prepped and draped in the usual sterile fashion.  A time out was called.  Ultrasound was used to evaluate the left common femoral artery.  There is no spasm percent lidocaine cannulated with direct ultrasound visualization followed by micropuncture wire and sheath.  And  images saved the permanent record.  Bentson wires placed followed by 5 [place] sheath.  Omni catheter was placed to the level of L1 aortogram was performed.  We crossed the bifurcation of the right common femoral artery using Omni catheter and Bentson wire.  We then performed right lower extremity angiography with above findings.  We then remove the catheter over wire left lower extremity angiography was performed through the sheath angiogram.  With the above findings we plan for right common femoral to below-knee popliteal artery bypass and likely will need thrombectomy for limb salvage.  This will be planned tomorrow in the OR.  Sheath to be pulled in postoperative holding.  He tolerated procedure without any complication. ? ?Contrast: 95cc ? ? ?Marnesha Gagen C. Donzetta Matters, MD ?[facility] ?Office: [phone] ?Pager: [phone] ? ? ?

## 2021-06-21 NOTE — Interval H&P Note (Signed)
History and Physical Interval Note: ? ?06/21/2021 ?11:15 AM ? ?Douglas [name]  has presented today for surgery, with the diagnosis of critical limb ischemia with rest pain.  The various methods of treatment have been discussed with the patient and family. After consideration of risks, benefits and other options for treatment, the patient has consented to  Procedure(s): ?ABDOMINAL AORTOGRAM W/LOWER EXTREMITY (N/A) as a surgical intervention.  The patient's history has been reviewed, patient examined, no change in status, stable for surgery.  I have reviewed the patient's chart and labs.  Questions were answered to the patient's satisfaction.   ? ? ?Douglas [name] ? ? ?

## 2021-06-22 ENCOUNTER — Encounter (HOSPITAL_COMMUNITY): Payer: Self-pay | Admitting: Internal Medicine

## 2021-06-22 ENCOUNTER — Inpatient Hospital Stay (HOSPITAL_COMMUNITY): Payer: 59

## 2021-06-22 ENCOUNTER — Other Ambulatory Visit: Payer: Self-pay

## 2021-06-22 ENCOUNTER — Inpatient Hospital Stay (HOSPITAL_COMMUNITY): Payer: 59 | Admitting: Certified Registered"

## 2021-06-22 ENCOUNTER — Encounter (HOSPITAL_COMMUNITY)
Admission: EM | Disposition: A | Discharge status: Rehabilitation Facility | Payer: Self-pay | Source: Home / Self Care | Attending: Internal Medicine

## 2021-06-22 DIAGNOSIS — I70221 Atherosclerosis of native arteries of extremities with rest pain, right leg: Secondary | ICD-10-CM

## 2021-06-22 DIAGNOSIS — K920 Hematemesis: Secondary | ICD-10-CM

## 2021-06-22 DIAGNOSIS — I10 Essential (primary) hypertension: Secondary | ICD-10-CM

## 2021-06-22 DIAGNOSIS — I252 Old myocardial infarction: Secondary | ICD-10-CM

## 2021-06-22 DIAGNOSIS — E1151 Type 2 diabetes mellitus with diabetic peripheral angiopathy without gangrene: Secondary | ICD-10-CM

## 2021-06-22 HISTORY — PX: FEMORAL-POPLITEAL BYPASS GRAFT: SHX937

## 2021-06-22 LAB — GLUCOSE, CAPILLARY
Glucose-Capillary: 226 mg/dL — ABNORMAL HIGH (ref 70–99)
Glucose-Capillary: 179 mg/dL — ABNORMAL HIGH (ref 70–99)
Glucose-Capillary: 191 mg/dL — ABNORMAL HIGH (ref 70–99)
Glucose-Capillary: 163 mg/dL — ABNORMAL HIGH (ref 70–99)

## 2021-06-22 LAB — POCT I-STAT 7, (LYTES, BLD GAS, ICA,H+H)
Acid-Base Excess: 7 mmol/L — ABNORMAL HIGH (ref 0.0–2.0)
Bicarbonate: 31.2 mmol/L — ABNORMAL HIGH (ref 20.0–28.0)
Calcium, Ion: 1.1 mmol/L — ABNORMAL LOW (ref 1.15–1.40)
HCT: 41 % (ref 39.0–52.0)
Hemoglobin: 13.9 g/dL (ref 13.0–17.0)
O2 Saturation: 97 %
Patient temperature: 36.9
Potassium: 3.4 mmol/L — ABNORMAL LOW (ref 3.5–5.1)
Sodium: 133 mmol/L — ABNORMAL LOW (ref 135–145)
TCO2: 32 mmol/L (ref 22–32)
pCO2 arterial: 42.6 mmHg (ref 32–48)
pH, Arterial: 7.473 — ABNORMAL HIGH (ref 7.35–7.45)
pO2, Arterial: 89 mmHg (ref 83–108)

## 2021-06-22 LAB — BASIC METABOLIC PANEL
Anion gap: 11 (ref 5–15)
BUN: 21 mg/dL (ref 8–23)
CO2: 29 mmol/L (ref 22–32)
Calcium: 8.8 mg/dL — ABNORMAL LOW (ref 8.9–10.3)
Chloride: 93 mmol/L — ABNORMAL LOW (ref 98–111)
Creatinine, Ser: 0.75 mg/dL (ref 0.61–1.24)
GFR, Estimated: >60 mL/min (ref >60)
Glucose, Bld: 164 mg/dL — ABNORMAL HIGH (ref 70–99)
Potassium: 3.4 mmol/L — ABNORMAL LOW (ref 3.5–5.1)
Sodium: 133 mmol/L — ABNORMAL LOW (ref 135–145)

## 2021-06-22 LAB — CBC
HCT: 44.8 % (ref 39.0–52.0)
HCT: 47.2 % (ref 39.0–52.0)
Hemoglobin: 14.7 g/dL (ref 13.0–17.0)
Hemoglobin: 16 g/dL (ref 13.0–17.0)
MCH: 28.9 pg (ref 26.0–34.0)
MCH: 29.6 pg (ref 26.0–34.0)
MCHC: 32.8 g/dL (ref 30.0–36.0)
MCHC: 33.9 g/dL (ref 30.0–36.0)
MCV: 88 fL (ref 80.0–100.0)
MCV: 87.2 fL (ref 80.0–100.0)
Platelets: 449 10*3/uL — ABNORMAL HIGH (ref 150–400)
Platelets: 391 10*3/uL (ref 150–400)
RBC: 5.09 MIL/uL (ref 4.22–5.81)
RBC: 5.41 MIL/uL (ref 4.22–5.81)
RDW: 15.4 % (ref 11.5–15.5)
RDW: 15.3 % (ref 11.5–15.5)
WBC: 14.8 10*3/uL — ABNORMAL HIGH (ref 4.0–10.5)
WBC: 14.8 10*3/uL — ABNORMAL HIGH (ref 4.0–10.5)
nRBC: 0 % (ref 0.0–0.2)
nRBC: 0 % (ref 0.0–0.2)

## 2021-06-22 LAB — TYPE AND SCREEN
ABO/RH(D): A POS
Antibody Screen: NEGATIVE

## 2021-06-22 LAB — POCT I-STAT, CHEM 8
BUN: 28 mg/dL — ABNORMAL HIGH (ref 8–23)
Calcium, Ion: 1.16 mmol/L (ref 1.15–1.40)
Chloride: 90 mmol/L — ABNORMAL LOW (ref 98–111)
Creatinine, Ser: 0.7 mg/dL (ref 0.61–1.24)
Glucose, Bld: 183 mg/dL — ABNORMAL HIGH (ref 70–99)
HCT: 45 % (ref 39.0–52.0)
Hemoglobin: 15.3 g/dL (ref 13.0–17.0)
Potassium: 3.2 mmol/L — ABNORMAL LOW (ref 3.5–5.1)
Sodium: 134 mmol/L — ABNORMAL LOW (ref 135–145)
TCO2: 30 mmol/L (ref 22–32)

## 2021-06-22 LAB — HEMOGLOBIN AND HEMATOCRIT, BLOOD
HCT: 37.1 % — ABNORMAL LOW (ref 39.0–52.0)
HCT: 38.1 % — ABNORMAL LOW (ref 39.0–52.0)
Hemoglobin: 12.2 g/dL — ABNORMAL LOW (ref 13.0–17.0)
Hemoglobin: 12.5 g/dL — ABNORMAL LOW (ref 13.0–17.0)

## 2021-06-22 LAB — LIPID PANEL
Cholesterol: 150 mg/dL (ref 0–200)
HDL: 42 mg/dL (ref >40)
LDL Cholesterol: 91 mg/dL (ref 0–99)
Total CHOL/HDL Ratio: 3.6 RATIO
Triglycerides: 83 mg/dL (ref <150)
VLDL: 17 mg/dL (ref 0–40)

## 2021-06-22 LAB — HEPARIN LEVEL (UNFRACTIONATED): Heparin Unfractionated: 0.28 IU/mL — ABNORMAL LOW (ref 0.30–0.70)

## 2021-06-22 LAB — OCCULT BLOOD GASTRIC / DUODENUM (SPECIMEN CUP): Occult Blood, Gastric: POSITIVE — AB

## 2021-06-22 SURGERY — BYPASS GRAFT FEMORAL-POPLITEAL ARTERY
Anesthesia: General | Site: Leg Upper | Laterality: Right

## 2021-06-22 MED ORDER — ORAL CARE MOUTH RINSE: 15.0000 mL | Freq: Once | OROMUCOSAL | Status: AC

## 2021-06-22 MED ORDER — CEFAZOLIN SODIUM 1 G IJ SOLR
INTRAMUSCULAR | Status: AC
  Filled 2021-06-22: qty 20

## 2021-06-22 MED ORDER — ALTEPLASE 1 MG/ML SYRINGE FOR VASCULAR PROCEDURE
5.0000 mg | Freq: Once | INTRAMUSCULAR | Status: AC
  Administered 2021-06-22: 5 mg via INTRA_ARTERIAL
  Filled 2021-06-22: qty 5

## 2021-06-22 MED ORDER — ROCURONIUM BROMIDE 10 MG/ML (PF) SYRINGE
PREFILLED_SYRINGE | INTRAVENOUS | Status: AC
  Filled 2021-06-22: qty 10

## 2021-06-22 MED ORDER — ALBUMIN HUMAN 5 % IV SOLN
INTRAVENOUS | Status: DC | PRN
Start: 2021-06-22 — End: 2021-06-22

## 2021-06-22 MED ORDER — ONDANSETRON HCL 4 MG/2ML IJ SOLN
INTRAMUSCULAR | Status: AC
  Administered 2021-06-22: 4 mg via INTRAVENOUS
  Filled 2021-06-22: qty 2

## 2021-06-22 MED ORDER — ONDANSETRON HCL 4 MG/2ML IJ SOLN: 4.0000 mg | Freq: Once | INTRAMUSCULAR | Status: AC

## 2021-06-22 MED ORDER — PROPOFOL 10 MG/ML IV BOLUS
INTRAVENOUS | Status: AC
  Filled 2021-06-22: qty 20

## 2021-06-22 MED ORDER — SUGAMMADEX SODIUM 200 MG/2ML IV SOLN
INTRAVENOUS | Status: DC | PRN
  Administered 2021-06-22: 200 mg via INTRAVENOUS

## 2021-06-22 MED ORDER — MUPIROCIN 2 % EX OINT
1.0000 "application " | TOPICAL_OINTMENT | Freq: Two times a day (BID) | CUTANEOUS | Status: AC
  Administered 2021-06-22 – 2021-06-26 (×9): 1 via NASAL
  Filled 2021-06-22 (×5): qty 22

## 2021-06-22 MED ORDER — SUCCINYLCHOLINE CHLORIDE 200 MG/10ML IV SOSY
PREFILLED_SYRINGE | INTRAVENOUS | Status: DC | PRN
  Administered 2021-06-22: 100 mg via INTRAVENOUS

## 2021-06-22 MED ORDER — POTASSIUM CHLORIDE 10 MEQ/100ML IV SOLN
10.0000 meq | INTRAVENOUS | Status: AC
  Administered 2021-06-22 (×4): 10 meq via INTRAVENOUS
  Filled 2021-06-22 (×4): qty 100

## 2021-06-22 MED ORDER — PHENYLEPHRINE HCL-NACL 20-0.9 MG/250ML-% IV SOLN
INTRAVENOUS | Status: DC | PRN
Start: 2021-06-22 — End: 2021-06-22
  Administered 2021-06-22: 25 ug/min via INTRAVENOUS
  Administered 2021-06-22: 50 ug/min via INTRAVENOUS

## 2021-06-22 MED ORDER — GUAIFENESIN-DM 100-10 MG/5ML PO SYRP: 15.0000 mL | ORAL_SOLUTION | ORAL | Status: DC | PRN

## 2021-06-22 MED ORDER — PROPOFOL 10 MG/ML IV BOLUS
INTRAVENOUS | Status: DC | PRN
  Administered 2021-06-22: 150 mg via INTRAVENOUS

## 2021-06-22 MED ORDER — SODIUM CHLORIDE 0.9 % IV SOLN: 500.0000 mL | Freq: Once | INTRAVENOUS | Status: DC | PRN

## 2021-06-22 MED ORDER — PHENYLEPHRINE 40 MCG/ML (10ML) SYRINGE FOR IV PUSH (FOR BLOOD PRESSURE SUPPORT)
PREFILLED_SYRINGE | INTRAVENOUS | Status: AC
  Filled 2021-06-22: qty 10

## 2021-06-22 MED ORDER — PANTOPRAZOLE SODIUM 40 MG IV SOLR
40.0000 mg | Freq: Two times a day (BID) | INTRAVENOUS | Status: DC
  Administered 2021-06-22 – 2021-06-23 (×3): 40 mg via INTRAVENOUS
  Filled 2021-06-22 (×3): qty 10

## 2021-06-22 MED ORDER — POTASSIUM CHLORIDE CRYS ER 20 MEQ PO TBCR: 20.0000 meq | EXTENDED_RELEASE_TABLET | Freq: Every day | ORAL | Status: DC | PRN

## 2021-06-22 MED ORDER — ACETAMINOPHEN 10 MG/ML IV SOLN
INTRAVENOUS | Status: DC | PRN
  Administered 2021-06-22: 1000 mg via INTRAVENOUS

## 2021-06-22 MED ORDER — DEXAMETHASONE SODIUM PHOSPHATE 10 MG/ML IJ SOLN
INTRAMUSCULAR | Status: AC
  Filled 2021-06-22: qty 1

## 2021-06-22 MED ORDER — FENTANYL CITRATE (PF) 250 MCG/5ML IJ SOLN
INTRAMUSCULAR | Status: AC
  Filled 2021-06-22: qty 5

## 2021-06-22 MED ORDER — ONDANSETRON HCL 4 MG/2ML IJ SOLN
INTRAMUSCULAR | Status: DC | PRN
  Administered 2021-06-22: 4 mg via INTRAVENOUS

## 2021-06-22 MED ORDER — LACTATED RINGERS IV SOLN: INTRAVENOUS | Status: DC

## 2021-06-22 MED ORDER — CEFAZOLIN SODIUM-DEXTROSE 2-4 GM/100ML-% IV SOLN
2.0000 g | Freq: Three times a day (TID) | INTRAVENOUS | Status: AC
  Administered 2021-06-22 – 2021-06-23 (×2): 2 g via INTRAVENOUS
  Filled 2021-06-22 (×2): qty 100

## 2021-06-22 MED ORDER — HEPARIN SODIUM (PORCINE) 1000 UNIT/ML IJ SOLN
INTRAMUSCULAR | Status: AC
  Filled 2021-06-22: qty 20

## 2021-06-22 MED ORDER — ONDANSETRON HCL 4 MG/2ML IJ SOLN
INTRAMUSCULAR | Status: AC
  Filled 2021-06-22: qty 2

## 2021-06-22 MED ORDER — CEFAZOLIN SODIUM-DEXTROSE 2-3 GM-%(50ML) IV SOLR
INTRAVENOUS | Status: DC | PRN
  Administered 2021-06-22: 2 g via INTRAVENOUS

## 2021-06-22 MED ORDER — MAGNESIUM SULFATE 2 GM/50ML IV SOLN: 2.0000 g | Freq: Every day | INTRAVENOUS | Status: DC | PRN

## 2021-06-22 MED ORDER — HEPARIN 6000 UNIT IRRIGATION SOLUTION
Status: AC
  Filled 2021-06-22: qty 500

## 2021-06-22 MED ORDER — ACETAMINOPHEN 10 MG/ML IV SOLN
INTRAVENOUS | Status: AC
  Filled 2021-06-22: qty 100

## 2021-06-22 MED ORDER — CHLORHEXIDINE GLUCONATE 0.12 % MT SOLN
OROMUCOSAL | Status: AC
  Administered 2021-06-22: 15 mL via OROMUCOSAL
  Filled 2021-06-22: qty 15

## 2021-06-22 MED ORDER — FENTANYL CITRATE (PF) 250 MCG/5ML IJ SOLN
INTRAMUSCULAR | Status: DC | PRN
  Administered 2021-06-22 (×3): 50 ug via INTRAVENOUS
  Administered 2021-06-22: 100 ug via INTRAVENOUS

## 2021-06-22 MED ORDER — CHLORHEXIDINE GLUCONATE 0.12 % MT SOLN: 15.0000 mL | Freq: Once | OROMUCOSAL | Status: AC

## 2021-06-22 MED ORDER — HEPARIN 6000 UNIT IRRIGATION SOLUTION
Status: DC | PRN
  Administered 2021-06-22: 1

## 2021-06-22 MED ORDER — ROCURONIUM BROMIDE 10 MG/ML (PF) SYRINGE
PREFILLED_SYRINGE | INTRAVENOUS | Status: DC | PRN
  Administered 2021-06-22 (×2): 30 mg via INTRAVENOUS
  Administered 2021-06-22: 20 mg via INTRAVENOUS

## 2021-06-22 MED ORDER — METOPROLOL TARTRATE 5 MG/5ML IV SOLN: 2.0000 mg | INTRAVENOUS | Status: DC | PRN

## 2021-06-22 MED ORDER — DEXAMETHASONE SODIUM PHOSPHATE 10 MG/ML IJ SOLN
INTRAMUSCULAR | Status: DC | PRN
Start: 2021-06-22 — End: 2021-06-22
  Administered 2021-06-22: 5 mg via INTRAVENOUS

## 2021-06-22 MED ORDER — HEPARIN SODIUM (PORCINE) 1000 UNIT/ML IJ SOLN
INTRAMUSCULAR | Status: DC | PRN
  Administered 2021-06-22: 10000 [IU] via INTRAVENOUS
  Administered 2021-06-22: 5000 [IU] via INTRAVENOUS

## 2021-06-22 MED ORDER — LIDOCAINE 2% (20 MG/ML) 5 ML SYRINGE
INTRAMUSCULAR | Status: AC
  Filled 2021-06-22: qty 5

## 2021-06-22 MED ORDER — HEPARIN (PORCINE) 25000 UT/250ML-% IV SOLN
INTRAVENOUS | Status: AC
  Filled 2021-06-22: qty 250

## 2021-06-22 MED ORDER — CHLORHEXIDINE GLUCONATE CLOTH 2 % EX PADS
6.0000 | MEDICATED_PAD | Freq: Every day | CUTANEOUS | Status: AC
  Administered 2021-06-22 – 2021-06-26 (×5): 6 via TOPICAL

## 2021-06-22 MED ORDER — LIDOCAINE 2% (20 MG/ML) 5 ML SYRINGE
INTRAMUSCULAR | Status: DC | PRN
  Administered 2021-06-22: 80 mg via INTRAVENOUS

## 2021-06-22 MED ORDER — LACTATED RINGERS IV SOLN: INTRAVENOUS | Status: DC | PRN

## 2021-06-22 MED ORDER — SODIUM CHLORIDE 0.9 % IV SOLN: INTRAVENOUS | Status: AC

## 2021-06-22 MED ORDER — 0.9 % SODIUM CHLORIDE (POUR BTL) OPTIME
TOPICAL | Status: DC | PRN
  Administered 2021-06-22: 2000 mL

## 2021-06-22 MED ORDER — HEMOSTATIC AGENTS (NO CHARGE) OPTIME
TOPICAL | Status: DC | PRN
  Administered 2021-06-22: 1 via TOPICAL

## 2021-06-22 MED ORDER — PHENOL 1.4 % MT LIQD: 1.0000 | OROMUCOSAL | Status: DC | PRN

## 2021-06-22 SURGICAL SUPPLY — 46 items
ADH SKN CLS APL DERMABOND .7 (GAUZE/BANDAGES/DRESSINGS) ×1
CANISTER SUCT 3000ML PPV (MISCELLANEOUS) ×2 IMPLANT
CANNULA VESSEL 3MM 2 BLNT TIP (CANNULA) ×1 IMPLANT
CATH EMB 2FR 60CM (CATHETERS) ×1 IMPLANT
CATH EMB 3FR 40CM (CATHETERS) ×1 IMPLANT
CATH EMB 3FR 80CM (CATHETERS) ×1 IMPLANT
CLIP LIGATING EXTRA MED SLVR (CLIP) ×2 IMPLANT
CLIP LIGATING EXTRA SM BLUE (MISCELLANEOUS) ×4 IMPLANT
COVER PROBE W GEL 5X96 (DRAPES) ×1 IMPLANT
DERMABOND ADVANCED (GAUZE/BANDAGES/DRESSINGS) ×1
DERMABOND ADVANCED .7 DNX12 (GAUZE/BANDAGES/DRESSINGS) ×1 IMPLANT
DRAPE HALF SHEET 40X57 (DRAPES) ×1 IMPLANT
DRSG COVADERM 4X6 (GAUZE/BANDAGES/DRESSINGS) ×1 IMPLANT
ELECT REM PT RETURN 9FT ADLT (ELECTROSURGICAL) ×2
ELECTRODE REM PT RTRN 9FT ADLT (ELECTROSURGICAL) ×1 IMPLANT
GLOVE SURG ENC MOIS LTX SZ7.5 (GLOVE) ×2 IMPLANT
GOWN STRL REUS W/ TWL LRG LVL3 (GOWN DISPOSABLE) ×2 IMPLANT
GOWN STRL REUS W/ TWL XL LVL3 (GOWN DISPOSABLE) ×1 IMPLANT
GOWN STRL REUS W/TWL LRG LVL3 (GOWN DISPOSABLE) ×4
GOWN STRL REUS W/TWL XL LVL3 (GOWN DISPOSABLE) ×2
KIT BASIN OR (CUSTOM PROCEDURE TRAY) ×2 IMPLANT
KIT TURNOVER KIT B (KITS) ×2 IMPLANT
NS IRRIG 1000ML POUR BTL (IV SOLUTION) ×4 IMPLANT
PACK PERIPHERAL VASCULAR (CUSTOM PROCEDURE TRAY) ×2 IMPLANT
PAD ARMBOARD 7.5X6 YLW CONV (MISCELLANEOUS) ×4 IMPLANT
PENCIL SMOKE EVACUATOR (MISCELLANEOUS) ×1 IMPLANT
POWDER SURGICEL 3.0 GRAM (HEMOSTASIS) ×1 IMPLANT
SLEEVE SURGEON STRL (DRAPES) ×1 IMPLANT
STAPLER VISISTAT 35W (STAPLE) ×1 IMPLANT
SUT ETHILON 3 0 PS 1 (SUTURE) IMPLANT
SUT MNCRL AB 4-0 PS2 18 (SUTURE) ×6 IMPLANT
SUT PROLENE 5 0 C 1 24 (SUTURE) ×4 IMPLANT
SUT PROLENE 6 0 BV (SUTURE) ×5 IMPLANT
SUT PROLENE 7 0 BV1 MDA (SUTURE) ×1 IMPLANT
SUT SILK 2 0 SH (SUTURE) ×2 IMPLANT
SUT SILK 3 0 (SUTURE) ×2
SUT SILK 3-0 18XBRD TIE 12 (SUTURE) IMPLANT
SUT VIC AB 2-0 CT1 27 (SUTURE) ×6
SUT VIC AB 2-0 CT1 TAPERPNT 27 (SUTURE) ×2 IMPLANT
SUT VIC AB 3-0 SH 27 (SUTURE) ×8
SUT VIC AB 3-0 SH 27X BRD (SUTURE) ×2 IMPLANT
SYR 3ML LL SCALE MARK (SYRINGE) ×1 IMPLANT
TOWEL GREEN STERILE (TOWEL DISPOSABLE) ×2 IMPLANT
TRAY FOLEY MTR SLVR 16FR STAT (SET/KITS/TRAYS/PACK) ×2 IMPLANT
UNDERPAD 30X36 HEAVY ABSORB (UNDERPADS AND DIAPERS) ×2 IMPLANT
WATER STERILE IRR 1000ML POUR (IV SOLUTION) ×2 IMPLANT

## 2021-06-22 NOTE — Progress Notes (Signed)
ANTICOAGULATION CONSULT NOTE  ?Pharmacy Consult for heparin  ?Indication:  RLE ischemia  ? ?No Known Allergies ? ?Patient Measurements: ?Height: 6' (182.9 cm) ?Weight: 102.1 kg (225 lb) ?IBW/kg (Calculated) : 77.6 ?Heparin Dosing Weight: 98.5 ? ?Vital Signs: ?Temp: 98.1 ?F (36.7 ?C) (03/14 1647) ?Temp Source: Oral (03/14 1014) ?BP: 136/60 (03/14 1647) ?Pulse Rate: 77 (03/14 1647) ? ?Labs: ?Recent Labs  ?  06/20/21 ?1548 06/21/21 ?0401 06/22/21 ?0023 06/22/21 ?0600 06/22/21 ?1103 06/22/21 ?1230  ?HGB  --  14.7 14.7 16.0 15.3 13.9  ?HCT  --  42.9 44.8 47.2 45.0 41.0  ?PLT  --  410* 449* 391  --   --   ?HEPARINUNFRC 0.38 0.23* 0.28*  --   --   --   ?CREATININE  --  0.86 0.75  --  0.70  --   ? ? ? ?Estimated Creatinine Clearance: 103.2 mL/min (by C-G formula based on SCr of 0.7 mg/dL). ? ? ?Assessment: ?71 y.o. male with RLE ischemia s/p  bypass and thrombectomy 3/14. No  anticoagulation prior to admission. Pharmacy consulted for heparin, no bolus.  ? ?Previously subtherapeutic on 2250 units/hr and increased to 2500 units/hr. Infusion was stopped for OR before level could be drawn.  ? ?Goal of Therapy:  ?Heparin level 0.3-0.7 units/ml ?Monitor platelets by anticoagulation protocol: Yes ?  ?Plan:  ?Resume heparin 2500 units/hr, no bolus ?F/u 8hr HL  ?Monitor daily heparin level, CBC ?Monitor for signs/symptoms of bleeding  ? ? ?Benetta Spar, PharmD, BCPS, BCCP ?Clinical Pharmacist ? ?Please check AMION for all [place] phone numbers ?After 10:00 PM, call [place] [phone] ? ? ?

## 2021-06-22 NOTE — Anesthesia Procedure Notes (Signed)
Arterial Line Insertion ?Start/End3/14/2023 10:45 AM, 06/22/2021 11:00 AM ?Performed by: Betha Loa, CRNA, CRNA ? Preanesthetic checklist: patient identified, IV checked, site marked, risks and benefits discussed, surgical consent, monitors and equipment checked, pre-op evaluation, timeout performed and anesthesia consent ?Lidocaine 1% used for infiltration ?Left, radial was placed ?Catheter size: 20 G ?Hand hygiene performed  and maximum sterile barriers used  ?Allen's test indicative of satisfactory collateral circulation ?Attempts: 1 ?Procedure performed without using ultrasound guided technique. ? ? ?

## 2021-06-22 NOTE — Significant Event (Signed)
Rapid Response Event Note  ? ?Reason for Call :  ?Vomited about a liter of gastric contents.  Brown, possibly coffee ground appearance.   ? ?Initial Focused Assessment:  ?Patient has constant diffuse abdominal tenderness.  Denies increased pain with palpation.  Hypoactive to absent bowel sounds.  Distended abdomen. ? ?He is alert and oriented.  He states his nausea is better after compazine. ?Lung sounds decreased bases ?Right foot swollen, purple and pale, absent pulse, tender to touch on heparin gtt at 25cc/hr. Awaiting surgery.  Able to palpate popliteal pulse ? ?BP 156/78  HR 79 vpaced ?RR 16-18  O2 sat 100% on 2L [place] ? ? ?Interventions:  ?Abdominal x ray ?Labs done ? ?Plan of Care/Follow up ?0800 Pt states he is feeling better.  Has not vomited in the past couple hours. ? ? ?Event Summary:  ? ?MD Notified: Dr Cyd Silence, notified by RN prior to my arrival ?Call Time: 0445 ?Arrival Time: 0520 ?End Time: 5573 ? ?Raliegh Ip, RN ?

## 2021-06-22 NOTE — Progress Notes (Incomplete Revision)
HOSPITAL MEDICINE OVERNIGHT EVENT NOTE   ? ?Notified by nursing that patient has experienced several episodes of dark-colored, coffee-ground appearing vomitus throughout the evening shift.  Nursing reports that patient also experienced at least 1 or 2 additional episodes of similar vomiting towards the end of dayshift yesterday. ? ?Patient is additionally complaining of generalized "sore and burning" mild abdominal discomfort. ? ?Chart reviewed,  patient is suffering from critical limb ischemia and is currently on a heparin infusion and is to undergo angiography early in the afternoon on 3/14. ? ?CBC this morning reveals a hemoglobin of 14.7, which is unchanged from yesterday.  This would suggest against significant gastrointestinal bleeding. ? ?We will keep patient n.p.o., initiate Protonix 40 mg IV every 12 hours.  We will obtain gastric Hemoccult, type and screen and CBCs every 6 hours.  If gastric Hemoccult is found to be positive and hemoglobin is downtrending will consider notoifying GI.  However, at this point we will continue to monitor closely for recurrence and plan to continue with the angiogram.  There is no indication to discontinue heparin infusion at this time. ? ?Vernelle Emerald  MD ?[facility]  ? ?ADDENDUM  ? ? ? ? ? ? ? ? ? ? ?

## 2021-06-22 NOTE — Progress Notes (Signed)
ANTICOAGULATION CONSULT NOTE  ?Pharmacy Consult for heparin  ?Indication:  RLE ischemia ?Brief A/P: Heparin level subtherapeutic Increase Heparin rate ? ?No Known Allergies ? ?Patient Measurements: ?Height: 6' (182.9 cm) ?Weight: 102.1 kg (225 lb) ?IBW/kg (Calculated) : 77.6 ?Heparin Dosing Weight: 98.5 ? ?Vital Signs: ?Temp: 98.5 ?F (36.9 ?C) (03/13 2346) ?Temp Source: Oral (03/13 2346) ?BP: 161/80 (03/13 2346) ?Pulse Rate: 88 (03/13 2346) ? ?Labs: ?Recent Labs  ?  06/19/21 ?1165 06/19/21 ?1525 06/20/21 ?0020 06/20/21 ?0740 06/20/21 ?1548 06/21/21 ?0401 06/22/21 ?0023  ?HGB 14.8  --  14.7  --   --  14.7 14.7  ?HCT 44.5  --  45.5  --   --  42.9 44.8  ?PLT 543*  --  496*  --   --  410* 449*  ?HEPARINUNFRC <0.10*   < > 0.17*   < > 0.38 0.23* 0.28*  ?CREATININE 0.87  --  0.84  --   --  0.86  --   ? < > = values in this interval not displayed.  ? ? ? ?Estimated Creatinine Clearance: 96 mL/min (by C-G formula based on SCr of 0.86 mg/dL). ? ? ?Assessment: ?71 y.o. male with RLE ischemia awaiting bypass/thrombectomy for heparin ? ?Goal of Therapy:  ?Heparin level 0.3-0.7 units/ml ?Monitor platelets by anticoagulation protocol: Yes ?  ?Plan:  ?Increase Heparin 2500 units/hr ?F/U after OR today ? ?Phillis Knack, PharmD, BCPS ? ?   ? ? ? ? ?

## 2021-06-22 NOTE — Anesthesia Procedure Notes (Signed)
Procedure Name: Intubation ?Date/Time: 06/22/2021 11:30 AM ?Performed by: Gaylene Brooks, CRNA ?Pre-anesthesia Checklist: Patient identified, Emergency Drugs available, Suction available and Patient being monitored ?Patient Re-evaluated:Patient Re-evaluated prior to induction ?Oxygen Delivery Method: Circle System Utilized ?Preoxygenation: Pre-oxygenation with 100% oxygen ?Induction Type: IV induction, Rapid sequence and Cricoid Pressure applied ?Laryngoscope Size: Sabra Heck and 2 ?Grade View: Grade II ?Tube type: Oral ?Tube size: 7.5 mm ?Number of attempts: 1 ?Airway Equipment and Method: Stylet and Oral airway ?Placement Confirmation: ETT inserted through vocal cords under direct vision, positive ETCO2 and breath sounds checked- equal and bilateral ?Secured at: 24 cm ?Tube secured with: Tape ?Dental Injury: Teeth and Oropharynx as per pre-operative assessment  ?Comments: RSI with cricoid due to recent vomiting. AOI. Cords clear of emesis.  ? ? ? ? ?

## 2021-06-22 NOTE — Progress Notes (Signed)
?  ?  I was called to patient's bedside for bleeding from right ankle incision where there are staples in place.  I took the dressing down and evaluated what appears to be venous oozing.  I have discussed with the patient keeping him on heparin drip due to the high risk for limb loss and I replaced a gauze dressing with Ace wrap to apply pressure.  He does have an anterior tibial and posterior tibial signal that can be traced onto the proximal foot nothing past that although he does have capillary refill at about the midfoot that is delayed but much improved from preop.  He remains at high risk for limb loss and he understands this. ? ?Mukhtar Shams C. Donzetta Matters, MD ?

## 2021-06-22 NOTE — Progress Notes (Signed)
Pt.vomited x3 since last night with coffee ground vomitus & c/o burning pain on his abdomen  which is distended & some tenderness.MD on call [place] was notified &  new orders received.Rapid response nurse was also called  & came to see pt. ?

## 2021-06-22 NOTE — Progress Notes (Signed)
Pt arrived to 4E from PACU. CHG bath done. Tele applied, CCMD called. A&Ox4. VSS. Rt leg incisions c/d/I, dermabond. Pt oriented to room, call light in reach.  ? ?Raelyn Number, RN ? ?

## 2021-06-22 NOTE — Op Note (Signed)
? ? ?Patient name: Douglas [name] MRN: [id number] DOB: October 15, 1950 Sex: male ? ?06/22/2021 ?Pre-operative Diagnosis: critical right lower extremity ischemia with pain and numbness and motor dysfunction ?Post-operative diagnosis:  Same ?Surgeon:  Eda Paschal. Donzetta Matters, MD ?Assistant: Arlee Muslim, PA ?Procedure Performed: ?1.  Right common femoral to below-knee popliteal artery bypass with nonreversed ipsilateral translocated greater saphenous vein ?2.  Harvest right greater saphenous vein ?3.  Right posterior tibial ankle exposure and thrombectomy with instillation 5 mg tPA ? ?Indications: 71 year old male presenting with a 2-week history of right foot pain with discoloration found to have severely diminished ABIs.  The day prior to this he underwent angiography which demonstrated flush occlusion of his SFA with reconstitution above the knee popliteal appeared to have some acute thrombus.  The posterior tibial artery was occluded at the ankle.  The anterior tibial artery also occluded at the ankle with 1 small collateral feeding the foot.  He was subsequently indicated for right common femoral to below-knee popliteal artery bypass and thrombectomy of the posterior tibial artery. ? ?An experienced assistant was necessary given the complexity of this case.  Specifically Arlee Muslim, [place] assisted exposure of the vein and harvest of the vein using suction and retraction and also help to expose both the common femoral and below-knee popliteal arteries using suction and countertraction and aided in anastomosis by following the suture line. ? ?Findings: Common femoral artery was thickened but no flow-limiting stenosis was identified.  Below-knee popliteal artery was soft for bypass.  There are calcifications distally in the popliteal artery at the level of the anterior tibial artery takeoff.  I elected not to pass the Fogarty from the popliteal distally given the calcifications there.  Rather I cut down the posterior tibial artery  and perform thrombectomy which did return significant acute and chronic appearing thrombus with a 2 Fogarty but I could only establish trickle backbleeding.  I therefore instilled 5 mg of tPA and closed the artery primarily.  At completion the patient's foot was much warmer there was a good signal in the distal popliteal artery below the bypass that was bypass dependent.  There was good pulsatile bleeding into the posterior tibial artery but a high resistance signal I could not obtain an anterior tibial artery signal in the operating room. ?  ?Procedure:  The patient was identified in the holding area and taken to the operating was placed supine operative table general anesthesia was induced.  He was to the prepped draped in the right lower extremity usual fashion, antibiotics were minister timeout was called.  Ultrasound was used to identify the greater saphenous vein on the leg.  Transverse incision was made in the groin.  We dissected down to the common femoral artery.  The assistant helped expose this using suction and countertraction.  I did expose the common femoral artery which was soft and amenable for clamping proximally and distally.  Through the same incision identify the greater saphenous vein.  I divided branches between clips and ties in the groin.  We dissected back to the saphenofemoral junction.  Multiple skip incisions were made above the knee to expose the saphenous vein and the assistant did help with exposure of the vein using countertraction and suction.  Below the knee we made an incision posterior to the vein.  The vein was dissected free.  Distally in this incision the vein was somewhat diseased.  It was tied off distally and transected.  I completed dividing branches between clips and ties into the  entire vein was harvested.  I then placed a side-biting clamp at the saphenofemoral junction and divided the vein and this was passed off the table.  I oversewed the saphenofemoral junction with  running 5-0 Prolene suture in a mattress fashion.  This time the assistant did help by preparing the vein on the back table.  I then extended the incision below the knee and began exposing the popliteal artery.  The assistant then helped with this as well.  When this was fully exposed I placed a tunneler between the femoral exposure incision and below-knee popliteal incision the patient was fully heparinized.  The common femoral artery was clamped distally and proximally opened longitudinally.  There was no need for endarterectomy.  The vein was spatulated nonreversed and sewn end to side with 5-0 Prolene suture.  Prior completion we released our clamps.  We then performed valve lysis of the vein until we had pulsatile bleeding through it.  Was marked for orientation and assistant helped with tunneling and maintaining orientation.  Below the knee we then clamped the popliteal artery proximally and distally and opened longitudinally.  I did have good backbleeding there.  There was minimal antegrade bleeding.  The vein was trimmed to size and spatulated and sewn into side with 6-0 Prolene suture.  Prior completion without flushing all directions.  The assistant did help with following the suture to aid in anastomosis.  Upon completion there was good signal in the popliteal artery I did have a high resistance outflow signal at the distal posterior tibial artery.  Ultrasound was used identify the posterior tibial artery made a longitudinal incision on the skin.  We dissected down the posterior tibial artery and placed a vessel loop around this.  I then opened transversely and there was pulsatile inflow and this was clamped.  I passed the 2 Fogarty distally until no clot would return.  I did review turn significant amounts of thrombus that appeared acute and chronic.  I only had trickle backbleeding.  I then dilated with a 2 and half millimeter dilator and instilled 5 mg of tPA distally and clamped the distal.  I then  repaired it with 7-0 Prolene suture.  At completion there was a better signal in the posterior tibial artery although it was high resistance waveform I could not find an anterior tibial signal at the time but the foot was warmer.  Satisfied with this all wounds were irrigated and closed the upper leg wounds with Vicryl and Monocryl.  The ankle incision was closed with staples primarily.  He was then awakened from anesthesia having tolerated procedure without immediate complication.  All counts were correct at completion. ? ? ?EBL: 400cc ? ?Kahleah Crass C. Donzetta Matters, MD ?[facility] ?Office: [phone] ?Pager: [phone] ? ? ?

## 2021-06-22 NOTE — Progress Notes (Signed)
Dr. Virl Cagey ,Vascular MD on call was also made aware regarding pt's .vomting coffe ground vomitus. ?

## 2021-06-22 NOTE — Transfer of Care (Signed)
Immediate Anesthesia Transfer of Care Note ? ?Patient: Douglas [name] ? ?Procedure(s) Performed: RIGHT FEMORAL-POPLITEAL BYPASS WITH VEIN, RIGHT POPLITEAL THROMBECTOMY (Right: Leg Upper) ? ?Patient Location: PACU ? ?Anesthesia Type:General ? ?Level of Consciousness: awake, alert  and oriented ? ?Airway & Oxygen Therapy: Patient Spontanous Breathing and Patient connected to face mask oxygen ? ?Post-op Assessment: Report given to RN and Post -op Vital signs reviewed and stable ? ?Post vital signs: Reviewed and stable ? ?Last Vitals:  ?Vitals Value Taken Time  ?BP 143/64 06/22/21 1532  ?Temp    ?Pulse 83 06/22/21 1534  ?Resp 17 06/22/21 1534  ?SpO2 97 % 06/22/21 1534  ?Vitals shown include unvalidated device data. ? ?Last Pain:  ?Vitals:  ? 06/22/21 1014  ?TempSrc: Oral  ?PainSc: 0-No pain  ?   ? ?Patients Stated Pain Goal: 2 (06/22/21 1014) ? ?Complications: No notable events documented. ?

## 2021-06-22 NOTE — Progress Notes (Signed)
?  Progress Note ? ? ? ?06/22/2021 ?8:32 AM ?1 Day Post-Op ? ?Subjective: Overnight nausea with coffee-ground emesis, feeling better this morning ? ?Vitals:  ? 06/22/21 0538 06/22/21 0735  ?BP: (!) 156/78 140/69  ?Pulse:  84  ?Resp:  16  ?Temp:  98.3 ?F (36.8 ?C)  ?SpO2:  99%  ? ? ?Physical Exam: ?Awake alert oriented ?Nonlabored respirations ?Palpable right common femoral pulse left groin is soft with no hematoma. ? ?CBC ?   ?Component Value Date/Time  ? WBC 14.8 (H) 06/22/2021 0600  ? RBC 5.41 06/22/2021 0600  ? HGB 16.0 06/22/2021 0600  ? HGB 14.9 05/04/2021 0752  ? HCT 47.2 06/22/2021 0600  ? PLT 391 06/22/2021 0600  ? PLT 352 05/04/2021 0752  ? MCV 87.2 06/22/2021 0600  ? MCH 29.6 06/22/2021 0600  ? MCHC 33.9 06/22/2021 0600  ? RDW 15.3 06/22/2021 0600  ? LYMPHSABS 2.2 06/18/2021 1044  ? MONOABS 0.5 06/18/2021 1044  ? EOSABS 0.1 06/18/2021 1044  ? BASOSABS 0.1 06/18/2021 1044  ? ? ?BMET ?   ?Component Value Date/Time  ? NA 133 (L) 06/22/2021 0023  ? K 3.4 (L) 06/22/2021 0023  ? CL 93 (L) 06/22/2021 0023  ? CO2 29 06/22/2021 0023  ? GLUCOSE 164 (H) 06/22/2021 0023  ? BUN 21 06/22/2021 0023  ? CREATININE 0.75 06/22/2021 0023  ? CREATININE 0.94 05/04/2021 0752  ? CALCIUM 8.8 (L) 06/22/2021 0023  ? GFRNONAA >60 06/22/2021 0023  ? GFRNONAA >60 05/04/2021 0752  ? GFRAA >60 01/08/2020 0810  ? ? ?INR ?   ?Component Value Date/Time  ? INR 1.0 06/18/2021 1044  ? ? ? ?Intake/Output Summary (Last 24 hours) at 06/22/2021 0832 ?Last data filed at 06/22/2021 0500 ?Gross per 24 hour  ?Intake 1240.6 ml  ?Output 1000 ml  ?Net 240.6 ml  ? ? ? ?Assessment/plan:  71 y.o. male is s/p RLE angiogram, plan for bypass today in OR with probable thrombectomy. ? ? ?Christion Leonhard C. Donzetta Matters, MD ?[facility] ?Office: [phone] ?Pager: [phone] ? ?06/22/2021 ?8:32 AM ? ?

## 2021-06-22 NOTE — Anesthesia Preprocedure Evaluation (Addendum)
Anesthesia Evaluation  ?Patient identified by MRN, date of birth, ID band ?Patient awake ? ? ? ?Reviewed: ?Allergy & Precautions, NPO status , Patient's Chart, lab work & pertinent test results ? ?Airway ?Mallampati: II ? ?TM Distance: >3 FB ?Neck ROM: Full ? ? ? Dental ?no notable dental hx. ?(+) Dental Advisory Given ?  ?Pulmonary ?neg pulmonary ROS, former smoker,  ?  ?Pulmonary exam normal ?breath sounds clear to auscultation ? ? ? ? ? ? Cardiovascular ?hypertension, Pt. on medications and Pt. on home beta blockers ?+ Past MI and + Peripheral Vascular Disease  ?Normal cardiovascular exam+ dysrhythmias + pacemaker  ?Rhythm:Regular Rate:Normal ? ?Pacer interrogation 05/03/2021 ?Total VP 97.8 % ?Total AP 14.3 % ?AS-VS 2.2 % ?AS-VP 84.4 % ?AP-VS < 0.1% ?AP-VP 13.4 % ? ?Echo 01/2022 ??1. Left ventricular ejection fraction, by estimation, is 50 to 55%. The  ?left ventricle has low normal function. The left ventricle demonstrates  ?regional wall motion abnormalities (see scoring diagram/findings for  ?description). Indeterminate diastolic  ?filling due to E-A fusion. There is moderate hypokinesis of the left  ?ventricular, basal-mid inferolateral wall.  ??2. Right ventricular systolic function is normal. The right ventricular  ?size is normal.  ??3. The mitral valve is normal in structure. No evidence of mitral valve  ?regurgitation.  ??4. The aortic valve is tricuspid. Aortic valve regurgitation is not  ?visualized. Mild aortic valve sclerosis is present, with no evidence of  ?aortic valve stenosis.  ?  ?Neuro/Psych ?negative neurological ROS ?   ? GI/Hepatic ?negative GI ROS, Neg liver ROS,   ?Endo/Other  ?diabetes ? Renal/GU ?negative Renal ROS  ? ?  ?Musculoskeletal ?negative musculoskeletal ROS ?(+)  ? Abdominal ?(+) + obese,   ?Peds ? Hematology ?negative hematology ROS ?(+)   ?Anesthesia Other Findings ? ? Reproductive/Obstetrics ? ?  ? ? ? ? ? ? ? ? ? ? ? ? ? ?  ?   ? ? ? ? ? ?Anesthesia Physical ?Anesthesia Plan ? ?ASA: 3 ? ?Anesthesia Plan: General  ? ?Post-op Pain Management: Ofirmev IV (intra-op)*  ? ?Induction: Intravenous and Rapid sequence ? ?PONV Risk Score and Plan: 3 and Ondansetron, Dexamethasone and Treatment may vary due to age or medical condition ? ?Airway Management Planned: Oral ETT ? ?Additional Equipment: Arterial line ? ?Intra-op Plan:  ? ?Post-operative Plan: Extubation in OR ? ?Informed Consent: I have reviewed the patients History and Physical, chart, labs and discussed the procedure including the risks, benefits and alternatives for the proposed anesthesia with the patient or authorized representative who has indicated his/her understanding and acceptance.  ? ? ? ?Dental advisory given ? ?Plan Discussed with: CRNA ? ?Anesthesia Plan Comments: (Magnet available )  ? ? ? ? ?Anesthesia Quick Evaluation ? ?

## 2021-06-22 NOTE — Progress Notes (Addendum)
?PROGRESS NOTE ? ?Douglas [name]  HGD:[id number] DOB: 1950-10-16 DOA: 06/18/2021 ?PCP: [name], MD  ? ?Brief Narrative: ?Patient is a 71 year old male with history of BPH, CLL, hypertension who presented with right foot swelling, purple discoloration, pain, inability to ambulate.  On presentation he was found to have ischemic right lower extremity, started on heparin drip.  Vascular surgery consulted.  Found to have critical right lower extremity ischemia, status post right  lower extremity angiogram.  Vascular surgery planning for bypass surgery today with probable thrombectomy.  Hospital course also remarkable for coffee-ground emesis with dropping Hb ? ?Assessment & Plan: ? ?Principal Problem: ?  Critical limb ischemia of right lower extremity ([place]) ?Active Problems: ?  CLL (chronic lymphocytic leukemia) ([place]) ?  Hypertension ?  Diabetes mellitus ([place]) ?  BPH (benign prostatic hyperplasia) ?  Pacemaker ?  Coffee ground emesis ?  Physical debility ? ? ?Assessment and Plan: ?* Critical limb ischemia of right lower extremity ([place]) ?-Patient with h/o plantar fasciitis, last received injection for this on 2/17 ?-Within the next 1-2 weeks, he developed foot swelling, pain, discoloration ?-Upon arrival in the podiatry office, there was concern for limb ischemia and he was sent to the ER ?-He was started on heparin infusion ?-ABIs showed severe right lower extremity arterial disease, moderate left lower extremity arterial disease ?-Found to have critical right lower extremity ischemia.  Status post angiogram.  Vascular surgery did bypass, thrombectomy on 3/14. ?-There is a concern from vascular surgery that his bypass procedure will fail and he might need amputation ? ?Physical debility ?OT recommending SNF on DC ? ?Coffee ground emesis ?Had significant coffee-ground emesis on 3/14.  There was about 800 cc of dark emesis collected in a can.  Interestingly, patient does not have any symptoms, no abdominal pain,  nausea or vomiting.  Hemoglobin has been slowly dropping.  Started on Protonix drip.  There is necessity to continue heparin drip for now as per vascular surgery.  We will closely monitor H&H.  Will monitor for further episodes of bleeding. ? ?Pacemaker ?-High grade heart block ?-Pacer in place and working well in January per [name] ?-[name] was performed in 01/2021 pre-procedure with no obvious obstructive CAD ? ?BPH (benign prostatic hyperplasia) ?-continue terazosin ? ?Diabetes mellitus ([place]) ?-Last A1c was 6.7 ?-He is not on medications at home ?-Continue with sensitive-scale SSI ? ?Hypertension ?-Continue ARB, amlodipine, and terazosin ?-Continue prn IV hydralazine ? ?CLL (chronic lymphocytic leukemia) ([place]) ?-Diagnosed in 11/2018.   ?-Has done well with ibrutinib, and is continuing this medication. ?-Followed by Dr. [name]. ? ? ? ? ? ? ?Nutrition Problem: Inadequate oral intake ?Etiology: decreased appetite ?  ? ?DVT prophylaxis:SCD's Start: 06/22/21 1712IV heparin ? ? ?  Code Status: Full Code ? ?Family Communication: Called and discussed with wife on phone on 3/12 ? ?Patient status:Inpatient ? ?Patient is from :Home ? ?Anticipated discharge DQ:QIWL ? ?Estimated DC date:1-2 days,needs vascular surgery clearance ? ? ?Consultants: Vascular surgery ? ?Procedures:None  ? ?Antimicrobials:  ?Anti-infectives (From admission, onward)  ? ? Start     Dose/Rate Route Frequency Ordered Stop  ? 06/22/21 1800  ceFAZolin (ANCEF) IVPB 2g/100 mL premix       ? 2 g ?200 mL/hr over 30 Minutes Intravenous Every 8 hours 06/22/21 1712 06/23/21 0157  ? ?  ? ? ?Subjective: ? ?Patient seen and examined at the bedside this morning.  As described above, he had coffee-ground emesis last night.  During my evaluation, he was very comfortable  without any complaints and was saving his beard.  Denies any complaints ? ?Objective: ?Vitals:  ? 06/22/21 2000 06/22/21 2348 06/23/21 0422 06/23/21 1017  ?BP: (!) 134/52 (!) 124/55 133/61 130/66   ?Pulse: 79 86 89 89  ?Resp: '18 18 18 20  '$ ?Temp: 98.1 ?F (36.7 ?C) 98 ?F (36.7 ?C) 98.1 ?F (36.7 ?C) 98.2 ?F (36.8 ?C)  ?TempSrc: Oral Oral Oral Oral  ?SpO2: 94% 92% 97% 95%  ?Weight:      ?Height:      ? ? ?Intake/Output Summary (Last 24 hours) at 06/23/2021 1134 ?Last data filed at 06/23/2021 0734 ?Gross per 24 hour  ?Intake 1800 ml  ?Output 2500 ml  ?Net -700 ml  ? ?Filed Weights  ? 06/18/21 1200  ?Weight: 102.1 kg  ? ? ?Examination: ? ? ?General exam: Overall comfortable, not in distress ?HEENT: PERRL ?Respiratory system:  no wheezes or crackles  ?Cardiovascular system: S1 & S2 heard, RRR.  ?Gastrointestinal system: Abdomen is nondistended, soft and nontender. ?Central nervous system: Alert and oriented ?Extremities: Blue right foot, cold on touch ?Skin: No rashes, no ulcers,no icterus   ? ? ?Data Reviewed: I have personally reviewed following labs and imaging studies ? ?CBC: ?Recent Labs  ?Lab 06/18/21 ?1044 06/19/21 ?5102 06/20/21 ?0020 06/21/21 ?0401 06/22/21 ?0023 06/22/21 ?0600 06/22/21 ?1103 06/22/21 ?1230 06/22/21 ?1835 06/22/21 ?2214 06/23/21 ?0130 06/23/21 ?1013  ?WBC 11.3*   < > 12.6* 12.1* 14.8* 14.8*  --   --   --   --  11.9*  --   ?NEUTROABS 8.4*  --   --   --   --   --   --   --   --   --   --   --   ?HGB 15.7   < > 14.7 14.7 14.7 16.0   < > 13.9 12.2* 12.5* 12.1* 10.4*  ?HCT 47.8   < > 45.5 42.9 44.8 47.2   < > 41.0 37.1* 38.1* 35.6* 32.0*  ?MCV 90.0   < > 89.0 87.2 88.0 87.2  --   --   --   --  88.6  --   ?PLT 540*   < > 496* 410* 449* 391  --   --   --   --  400  --   ? < > = values in this interval not displayed.  ? ?Basic Metabolic Panel: ?Recent Labs  ?Lab 06/19/21 ?5852 06/20/21 ?0020 06/21/21 ?0401 06/22/21 ?0023 06/22/21 ?1103 06/22/21 ?1230 06/23/21 ?0130  ?NA 132* 129* 131* 133* 134* 133* 135  ?K 3.4* 3.8 3.2* 3.4* 3.2* 3.4* 3.6  ?CL 96* 94* 94* 93* 90*  --  97*  ?CO2 '27 23 26 29  '$ --   --  29  ?GLUCOSE 143* 142* 141* 164* 183*  --  154*  ?BUN '19 16 20 21 '$ 28*  --  25*  ?CREATININE 0.87 0.84  0.86 0.75 0.70  --  0.89  ?CALCIUM 8.8* 8.7* 8.7* 8.8*  --   --  7.6*  ? ? ? ?Recent Results (from the past 240 hour(s))  ?Resp Panel by RT-PCR (Flu A&B, Covid) Nasopharyngeal Swab     Status: None  ? Collection Time: 06/18/21 10:44 AM  ? Specimen: Nasopharyngeal Swab; Nasopharyngeal(NP) swabs in vial transport medium  ?Result Value Ref Range Status  ? SARS Coronavirus 2 by RT PCR NEGATIVE NEGATIVE Final  ?  Comment: (NOTE) ?SARS-CoV-2 target nucleic acids are NOT DETECTED. ? ?The SARS-CoV-2 RNA is generally detectable in upper respiratory ?  specimens during the acute phase of infection. The lowest ?concentration of SARS-CoV-2 viral copies this assay can detect is ?138 copies/mL. A negative result does not preclude SARS-Cov-2 ?infection and should not be used as the sole basis for treatment or ?other patient management decisions. A negative result may occur with  ?improper specimen collection/handling, submission of specimen other ?than nasopharyngeal swab, presence of viral mutation(s) within the ?areas targeted by this assay, and inadequate number of viral ?copies(<138 copies/mL). A negative result must be combined with ?clinical observations, patient history, and epidemiological ?information. The expected result is Negative. ? ?Fact Sheet for Patients:  ?EntrepreneurPulse.com.au ? ?Fact Sheet for Healthcare Providers:  ?IncredibleEmployment.be ? ?This test is no t yet approved or cleared by the [place] FDA and  ?has been authorized for detection and/or diagnosis of SARS-CoV-2 by ?FDA under an Emergency Use Authorization (EUA). This EUA will remain  ?in effect (meaning this test can be used) for the duration of the ?COVID-19 declaration under Section 564(b)(1) of the Act, 21 ?U.S.C.section 360bbb-3(b)(1), unless the authorization is terminated  ?or revoked sooner.  ? ? ?  ? Influenza A by PCR NEGATIVE NEGATIVE Final  ? Influenza B by PCR NEGATIVE NEGATIVE Final  ?  Comment:  (NOTE) ?The Xpert Xpress SARS-CoV-2/FLU/RSV plus assay is intended as an aid ?in the diagnosis of influenza from Nasopharyngeal swab specimens and ?should not be used as a sole basis for treatment. Nasal wash

## 2021-06-22 NOTE — Progress Notes (Addendum)
HOSPITAL MEDICINE OVERNIGHT EVENT NOTE   ? ?Notified by nursing that patient has experienced several episodes of dark-colored, coffee-ground appearing vomitus throughout the evening shift.  Nursing reports that patient also experienced at least 1 or 2 additional episodes of similar vomiting towards the end of dayshift yesterday. ? ?Patient is additionally complaining of generalized "sore and burning" mild abdominal discomfort. ? ?Chart reviewed,  patient is suffering from critical limb ischemia and is currently on a heparin infusion and underwent  angiograph on 3/14. ? ?CBC this morning reveals a hemoglobin of 14.7, which is unchanged from yesterday.  This would suggest against significant gastrointestinal bleeding. ? ?We will keep patient n.p.o., initiate Protonix 40 mg IV every 12 hours.  We will obtain gastric Hemoccult, type and screen and CBCs every 6 hours.  If gastric Hemoccult is found to be positive and hemoglobin is downtrending will consider notoifying GI.  However, at this point we will continue to monitor closely for recurrence and plan to continue with the angiogram.  There is no indication to discontinue heparin infusion at this time. ? ?Vernelle Emerald  MD ?[facility]  ? ?ADDENDUM  ? ? ? ? ? ? ? ? ? ? ?

## 2021-06-22 NOTE — Assessment & Plan Note (Addendum)
Had significant coffee-ground emesis on 3/14.  There was about 800 cc of dark emesis collected in a can.  Interestingly, patient does not have any symptoms, no abdominal pain, nausea or vomiting.  Hemoglobin has dropped from baseline but has remained stable now.  Started on Protonix drip.  Continue monitoring H&H.  We will check with vascular surgery about discontinuing heparin drip because he has been planned for right BKA. ?

## 2021-06-23 ENCOUNTER — Encounter (HOSPITAL_COMMUNITY): Payer: Self-pay | Admitting: Vascular Surgery

## 2021-06-23 DIAGNOSIS — I70221 Atherosclerosis of native arteries of extremities with rest pain, right leg: Secondary | ICD-10-CM | POA: Diagnosis not present

## 2021-06-23 DIAGNOSIS — R5381 Other malaise: Secondary | ICD-10-CM

## 2021-06-23 LAB — GLUCOSE, CAPILLARY
Glucose-Capillary: 160 mg/dL — ABNORMAL HIGH (ref 70–99)
Glucose-Capillary: 195 mg/dL — ABNORMAL HIGH (ref 70–99)
Glucose-Capillary: 126 mg/dL — ABNORMAL HIGH (ref 70–99)
Glucose-Capillary: 123 mg/dL — ABNORMAL HIGH (ref 70–99)

## 2021-06-23 LAB — CBC
HCT: 35.6 % — ABNORMAL LOW (ref 39.0–52.0)
Hemoglobin: 12.1 g/dL — ABNORMAL LOW (ref 13.0–17.0)
MCH: 30.1 pg (ref 26.0–34.0)
MCHC: 34 g/dL (ref 30.0–36.0)
MCV: 88.6 fL (ref 80.0–100.0)
Platelets: 400 10*3/uL (ref 150–400)
RBC: 4.02 MIL/uL — ABNORMAL LOW (ref 4.22–5.81)
RDW: 15.5 % (ref 11.5–15.5)
WBC: 11.9 10*3/uL — ABNORMAL HIGH (ref 4.0–10.5)
nRBC: 0 % (ref 0.0–0.2)

## 2021-06-23 LAB — BASIC METABOLIC PANEL
Anion gap: 9 (ref 5–15)
BUN: 25 mg/dL — ABNORMAL HIGH (ref 8–23)
CO2: 29 mmol/L (ref 22–32)
Calcium: 7.6 mg/dL — ABNORMAL LOW (ref 8.9–10.3)
Chloride: 97 mmol/L — ABNORMAL LOW (ref 98–111)
Creatinine, Ser: 0.89 mg/dL (ref 0.61–1.24)
GFR, Estimated: >60 mL/min (ref >60)
Glucose, Bld: 154 mg/dL — ABNORMAL HIGH (ref 70–99)
Potassium: 3.6 mmol/L (ref 3.5–5.1)
Sodium: 135 mmol/L (ref 135–145)

## 2021-06-23 LAB — HEPARIN LEVEL (UNFRACTIONATED)
Heparin Unfractionated: 0.52 IU/mL (ref 0.30–0.70)
Heparin Unfractionated: 0.84 IU/mL — ABNORMAL HIGH (ref 0.30–0.70)
Heparin Unfractionated: 0.78 IU/mL — ABNORMAL HIGH (ref 0.30–0.70)

## 2021-06-23 LAB — HEMOGLOBIN AND HEMATOCRIT, BLOOD
HCT: 32 % — ABNORMAL LOW (ref 39.0–52.0)
HCT: 35.8 % — ABNORMAL LOW (ref 39.0–52.0)
Hemoglobin: 10.4 g/dL — ABNORMAL LOW (ref 13.0–17.0)
Hemoglobin: 11.5 g/dL — ABNORMAL LOW (ref 13.0–17.0)

## 2021-06-23 LAB — ABO/RH: ABO/RH(D): A POS

## 2021-06-23 MED ORDER — HEPARIN (PORCINE) 25000 UT/250ML-% IV SOLN
2000.0000 [IU]/h | INTRAVENOUS | Status: DC
  Administered 2021-06-23 – 2021-06-25 (×3): 1900 [IU]/h via INTRAVENOUS
  Filled 2021-06-23 (×3): qty 250

## 2021-06-23 MED ORDER — PANTOPRAZOLE INFUSION (NEW) - SIMPLE MED
8.0000 mg/h | INTRAVENOUS | Status: DC
  Administered 2021-06-23 – 2021-06-25 (×5): 8 mg/h via INTRAVENOUS
  Filled 2021-06-23 (×5): qty 80

## 2021-06-23 MED ORDER — ROSUVASTATIN CALCIUM 20 MG PO TABS
20.0000 mg | ORAL_TABLET | Freq: Every day | ORAL | Status: DC
  Administered 2021-06-23 – 2021-07-01 (×8): 20 mg via ORAL
  Filled 2021-06-23 (×8): qty 1

## 2021-06-23 MED ORDER — PANTOPRAZOLE 80MG IVPB - SIMPLE MED
80.0000 mg | Freq: Once | INTRAVENOUS | Status: AC
  Administered 2021-06-23: 80 mg via INTRAVENOUS
  Filled 2021-06-23: qty 100
  Filled 2021-06-23: qty 80

## 2021-06-23 MED ORDER — PANTOPRAZOLE SODIUM 40 MG IV SOLR
40.0000 mg | Freq: Two times a day (BID) | INTRAVENOUS | Status: DC
Start: 2021-06-26 — End: 2021-06-25

## 2021-06-23 NOTE — Progress Notes (Signed)
Physical Therapy Evaluation ?Patient Details ?Name: Douglas [name] ?MRN: [id number] ?DOB: 03/03/1951 ?Today's Date: 06/23/2021 ? ?History of Present Illness ? Patient is a 71 yo male admitted on 06/18/2021 with right foot swelling concerning for ischemic leg. Status post bypass and thrombectomy 06/22/21. PMH: pacemaker, DM, HTN, and CLL.  ?Clinical Impression ? Pt admitted s/p procedure listed above. Previously worked with OT and was unable to stand. Pt states he is numb from "midfoot down," and has trace ankle dorsiflexion upon assessment. Pt deferring bed mobility currently due to significant pain (despite premedication); agreeable to bed level exercises for BLE ROM and strengthening. Pt states he "may have to get an amputation on Friday." Discussed PT role, activity progression, and importance of mobility. Prior to this admission, pt was independent and driving. He lives in a house with 3 steps to enter and 15 steps to bedroom/bathroom. Will need post acute rehab to address deficits and maximize functional mobility. ?   ? ?Recommendations for follow up therapy are one component of a multi-disciplinary discharge planning process, led by the attending physician.  Recommendations may be updated based on patient status, additional functional criteria and insurance authorization. ? ?Follow Up Recommendations Acute inpatient rehab (3hours/day) ? ?  ?Assistance Recommended at Discharge Frequent or constant Supervision/Assistance  ?Patient can return home with the following ? Two people to help with walking and/or transfers;A lot of help with bathing/dressing/bathroom;Assistance with cooking/housework;Assist for transportation;Help with stairs or ramp for entrance ? ?  ?Equipment Recommendations Other (comment) (TBA)  ?Recommendations for Other Services ? Rehab consult  ?  ?Functional Status Assessment Patient has had a recent decline in their functional status and demonstrates the ability to make significant improvements in  function in a reasonable and predictable amount of time.  ? ?  ?Precautions / Restrictions Precautions ?Precautions: Fall ?Restrictions ?Weight Bearing Restrictions: No  ? ?  ? ?Mobility ? Bed Mobility ?  ?  ?  ?  ?  ?  ?  ?General bed mobility comments: pt deferred ?  ? ?Transfers ?  ?  ?  ?  ?  ?  ?  ?  ?  ?General transfer comment: pt deferred ?  ? ?Ambulation/Gait ?  ?  ?  ?  ?  ?  ?  ?  ? ?Stairs ?  ?  ?  ?  ?  ? ?Wheelchair Mobility ?  ? ?Modified Rankin (Stroke Patients Only) ?  ? ?  ? ?Balance Overall balance assessment: Needs assistance ?Sitting-balance support: Bilateral upper extremity supported, Feet supported ?Sitting balance-Leahy Scale: Fair ?  ?  ?Standing balance support: Reliant on assistive device for balance, Bilateral upper extremity supported ?Standing balance-Leahy Scale: Poor ?Standing balance comment: Reliant on BUE support ?  ?  ?  ?  ?  ?  ?  ?  ?  ?  ?  ?   ? ? ? ?Pertinent Vitals/Pain Pain Assessment ?Pain Assessment: Faces ?Faces Pain Scale: Hurts even more ?Pain Location: Incisional pain with movement ?Pain Descriptors / Indicators: Sore, Discomfort, Guarding ?Pain Intervention(s): Limited activity within patient's tolerance, Monitored during session, Premedicated before session  ? ? ?Home Living Family/patient expects to be discharged to:: Private residence ?Living Arrangements: Spouse/significant other (Wife works from home) ?Available Help at Discharge: Family ?Type of Home: House ?Home Access: Stairs to enter ?Entrance Stairs-Rails: Left ?Entrance Stairs-Number of Steps: 3 ?Alternate Level Stairs-Number of Steps: 15 ?Home Layout: Two level ?Home Equipment: None ?Additional Comments: Was independent and still driving, retired  ?  ?  Prior Function Prior Level of Function : Independent/Modified Independent ?  ?  ?  ?  ?  ?  ?Mobility Comments: Independent and driving ?ADLs Comments: Independent ?  ? ? ?Hand Dominance  ?   ? ?  ?Extremity/Trunk Assessment  ? Upper Extremity  Assessment ?Upper Extremity Assessment: Defer to OT evaluation ?  ? ?Lower Extremity Assessment ?Lower Extremity Assessment: RLE deficits/detail;LLE deficits/detail ?RLE Deficits / Details: s/p thrombectomy. Limited assessement due to pain. 1/5 ankle dorsiflexion, able to perform limited quad set ?LLE Deficits / Details: Grossly 4/5 ?  ? ?Cervical / Trunk Assessment ?Cervical / Trunk Assessment: Normal  ?Communication  ? Communication: No difficulties  ?Cognition Arousal/Alertness: Awake/alert ?Behavior During Therapy: Anxious, WFL for tasks assessed/performed ?Overall Cognitive Status: Within Functional Limits for tasks assessed ?  ?  ?  ?  ?  ?  ?  ?  ?  ?  ?  ?  ?  ?  ?  ?  ?  ?  ?  ? ?  ?General Comments   ? ?  ?Exercises General Exercises - Lower Extremity ?Ankle Circles/Pumps: Both, 10 reps, Supine ?Quad Sets: Both, Other reps (comment), Supine (30 reps) ?Heel Slides: Left, 20 reps, Supine ?Hip ABduction/ADduction: Left, 20 reps, Supine ?Straight Leg Raises: Left, 20 reps, Supine ?Other Exercises ?Other Exercises: L single leg bridge x 10 (x2 sets) ?Other Exercises: (  ? ?Assessment/Plan  ?  ?PT Assessment Patient needs continued PT services  ?PT Problem List Decreased strength;Decreased activity tolerance;Decreased balance;Decreased mobility;Impaired sensation;Pain ? ?   ?  ?PT Treatment Interventions Gait training;DME instruction;Therapeutic activities;Functional mobility training;Balance training;Therapeutic exercise;Patient/family education   ? ?PT Goals (Current goals can be found in the Care Plan section)  ?Acute Rehab PT Goals ?Patient Stated Goal: less pain ?PT Goal Formulation: With patient ?Time For Goal Achievement: 07/07/21 ?Potential to Achieve Goals: Good ? ?  ?Frequency Min 3X/week ?  ? ? ?Co-evaluation   ?  ?  ?  ?  ? ? ?  ?AM-PAC PT "6 Clicks" Mobility  ?Outcome Measure Help needed turning from your back to your side while in a flat bed without using bedrails?: A Lot ?Help needed moving from  lying on your back to sitting on the side of a flat bed without using bedrails?: A Lot ?Help needed moving to and from a bed to a chair (including a wheelchair)?: Total ?Help needed standing up from a chair using your arms (e.g., wheelchair or bedside chair)?: Total ?Help needed to walk in hospital room?: Total ?Help needed climbing 3-5 steps with a railing? : Total ?6 Click Score: 8 ? ?  ?End of Session   ?Activity Tolerance: Patient limited by pain ?Patient left: in bed;with call bell/phone within reach;with bed alarm set ?Nurse Communication: Mobility status ?PT Visit Diagnosis: Pain;Difficulty in walking, not elsewhere classified (R26.2) ?Pain - Right/Left: Left ?Pain - part of body: Leg ?  ? ?Time: 3818-2993 ?PT Time Calculation (min) (ACUTE ONLY): 18 min ? ? ?Charges:   PT Evaluation ?$PT Eval Moderate Complexity: 1 Mod ?  ?  ?   ? ? ?Wyona Almas, PT, DPT ?Acute Rehabilitation Services ?Pager [phone] ?Office [phone] ? ? ?Carloine Margo Aye ?06/23/2021, 2:41 PM ?

## 2021-06-23 NOTE — Progress Notes (Addendum)
?Progress Note ? ? ? ?06/23/2021 ?8:47 AM ?1 Day Post-Op ? ?Subjective:  wants to know how long before we know if he will lose his foot. ? ?Afebrile ?HR 80's-90's  ?703'J-009'F systolic ?81% RA ? ?Vitals:  ? 06/23/21 0422 06/23/21 0733  ?BP: 133/61 130/66  ?Pulse: 89 89  ?Resp: 18 20  ?Temp: 98.1 ?F (36.7 ?C) 98.2 ?F (36.8 ?C)  ?SpO2: 97% 95%  ? ? ?Physical Exam: ?Cardiac:  regular ?Lungs:  non labored ?Incisions:  incisions look good.  Scant venous ooze from ankle incision ?Extremities:  right AT doppler signal and PT doppler signal proximal to ankle incision.  Distal foot is pale.  Motor is not in tact.  Unable to wiggle toes.  ? ? ?CBC ?   ?Component Value Date/Time  ? WBC 11.9 (H) 06/23/2021 0130  ? RBC 4.02 (L) 06/23/2021 0130  ? HGB 12.1 (L) 06/23/2021 0130  ? HGB 14.9 05/04/2021 0752  ? HCT 35.6 (L) 06/23/2021 0130  ? PLT 400 06/23/2021 0130  ? PLT 352 05/04/2021 0752  ? MCV 88.6 06/23/2021 0130  ? MCH 30.1 06/23/2021 0130  ? MCHC 34.0 06/23/2021 0130  ? RDW 15.5 06/23/2021 0130  ? LYMPHSABS 2.2 06/18/2021 1044  ? MONOABS 0.5 06/18/2021 1044  ? EOSABS 0.1 06/18/2021 1044  ? BASOSABS 0.1 06/18/2021 1044  ? ? ?BMET ?   ?Component Value Date/Time  ? NA 135 06/23/2021 0130  ? K 3.6 06/23/2021 0130  ? CL 97 (L) 06/23/2021 0130  ? CO2 29 06/23/2021 0130  ? GLUCOSE 154 (H) 06/23/2021 0130  ? BUN 25 (H) 06/23/2021 0130  ? CREATININE 0.89 06/23/2021 0130  ? CREATININE 0.94 05/04/2021 0752  ? CALCIUM 7.6 (L) 06/23/2021 0130  ? GFRNONAA >60 06/23/2021 0130  ? GFRNONAA >60 05/04/2021 0752  ? GFRAA >60 01/08/2020 0810  ? ? ?INR ?   ?Component Value Date/Time  ? INR 1.0 06/18/2021 1044  ? ? ? ?Intake/Output Summary (Last 24 hours) at 06/23/2021 0847 ?Last data filed at 06/23/2021 0734 ?Gross per 24 hour  ?Intake 1800 ml  ?Output 2800 ml  ?Net -1000 ml  ? ? ? ?Assessment/Plan:  71 y.o. male is s/p:  ?1.  Right common femoral to below-knee popliteal artery bypass with nonreversed ipsilateral translocated greater saphenous  vein ?2.  Harvest right greater saphenous vein ?3.  Right posterior tibial ankle exposure and thrombectomy with instillation 5 mg tPA  ?1 Day Post-Op ? ? ?-pt with right AT doppler signal and PT doppler signal present proximal to ankle incision. Motor is not intact.  Distal foot is pale.  Discussed with pt that time will tell, but he is at high risk for amputation. ?-scant venous blood from ankle incision-mild compression ace placed back on foot ?-DVT prophylaxis:  continue heparin gtt as he is high risk for limb loss. ? ? ?Leontine Locket, PA-C ?[facility] ?[phone] ?06/23/2021 ?8:47 AM ? ?I have independently interviewed and examined patient and agree with PA assessment and plan above.  He does have a good anterior tibial signal at the ankle but nothing really onto the foot and the foot remains mottled.  He was having significant pain with wrapping this was all removed and his pain has somewhat resolved.  Unfortunately he is likely to require below-knee amputation and he demonstrates good understanding of this.  He wants 1 more day to consider his options but will likely proceed with below-knee amputation on Friday. ? ?Bailea Beed C. Donzetta Matters, MD ?[facility] [place] ?Office: [phone] ?Pager: [phone] ? ? ? ?

## 2021-06-23 NOTE — Progress Notes (Signed)
?PROGRESS NOTE ? ?Douglas [name]  XAJ:[id number] DOB: [birth year] DOA: 06/18/2021 ?PCP: [name], MD  ? ?Brief Narrative: ?Patient is a 71 year old male with history of BPH, CLL, hypertension who presented with right foot swelling, purple discoloration, pain, inability to ambulate.  On presentation he was found to have ischemic right lower extremity, started on heparin drip.  Vascular surgery consulted.  Found to have critical right lower extremity ischemia, status post right  lower extremity angiogram.  Vascular surgery planning for bypass surgery today with probable thrombectomy.  Hospital course also remarkable for coffee-ground emesis with a stable hemoglobin. ? ?Assessment & Plan: ? ?Principal Problem: ?  Critical limb ischemia of right lower extremity ([place]) ?Active Problems: ?  CLL (chronic lymphocytic leukemia) ([place]) ?  Hypertension ?  Diabetes mellitus ([place]) ?  BPH (benign prostatic hyperplasia) ?  Pacemaker ?  Coffee ground emesis ?  Physical debility ? ? ?Assessment and Plan: ?* Critical limb ischemia of right lower extremity ([place]) ?-Patient with h/o plantar fasciitis, last received injection for this on 2/17 ?-Within the next 1-2 weeks, he developed foot swelling, pain, discoloration ?-Upon arrival in the podiatry office, there was concern for limb ischemia and he was sent to the ER ?-He was started on heparin infusion ?-ABIs showed severe right lower extremity arterial disease, moderate left lower extremity arterial disease ?-Found to have critical right lower extremity ischemia.  Status post angiogram.  Vascular surgery did bypass, thrombectomy on 3/14. ?-There is a concern from vascular surgery that his bypass procedure will fail and he might need amputation ? ?Physical debility ?OT recommending SNF on DC ? ?Coffee ground emesis ?Had significant coffee-ground emesis on 3/14.  There was about 800 cc of dark emesis collected in a can.  Interestingly, patient does not have any symptoms, no abdominal  pain, nausea or vomiting.  Hemoglobin has dropped to the range of  12 but stable.  Started on Protonix IV.  There is necessity to continue heparin drip for now as per vascular surgery.  We will closely monitor H&H.  No need to call GI at this point ? ?Pacemaker ?-High grade heart block ?-Pacer in place and working well in January per [name] ?-[name] was performed in 01/2021 pre-procedure with no obvious obstructive CAD ? ?BPH (benign prostatic hyperplasia) ?-continue terazosin ? ?Diabetes mellitus ([place]) ?-Last A1c was 6.7 ?-He is not on medications at home ?-Continue with sensitive-scale SSI ? ?Hypertension ?-Continue ARB, amlodipine, and terazosin ?-Continue prn IV hydralazine ? ?CLL (chronic lymphocytic leukemia) ([place]) ?-Diagnosed in 11/2018.   ?-Has done well with ibrutinib, and is continuing this medication. ?-Followed by Dr. [name]. ? ? ? ? ? ? ?Nutrition Problem: Inadequate oral intake ?Etiology: decreased appetite ?  ? ?DVT prophylaxis:SCD's Start: 06/22/21 1712IV heparin ? ? ?  Code Status: Full Code ? ?Family Communication: Called and discussed with wife on phone on 3/12 ? ?Patient status:Inpatient ? ?Patient is from :Home ? ?Anticipated discharge CN:OBSJ ? ?Estimated DC date:unsure,needs vascular surgery clearance ? ? ?Consultants: Vascular surgery ? ?Procedures:None  ? ?Antimicrobials:  ?Anti-infectives (From admission, onward)  ? ? Start     Dose/Rate Route Frequency Ordered Stop  ? 06/22/21 1800  ceFAZolin (ANCEF) IVPB 2g/100 mL premix       ? 2 g ?200 mL/hr over 30 Minutes Intravenous Every 8 hours 06/22/21 1712 06/23/21 0157  ? ?  ? ? ?Subjective: ? ?Patient seen and examined at the bedside this morning.  Hemodynamically stable.  Comfortable without any complaints.  No further  episodes of coffee-ground emesis. ? ?Objective: ?Vitals:  ? 06/22/21 2000 06/22/21 2348 06/23/21 0422 06/23/21 0973  ?BP: (!) 134/52 (!) 124/55 133/61 130/66  ?Pulse: 79 86 89 89  ?Resp: '18 18 18 20  '$ ?Temp: 98.1 ?F (36.7 ?C) 98  ?F (36.7 ?C) 98.1 ?F (36.7 ?C) 98.2 ?F (36.8 ?C)  ?TempSrc: Oral Oral Oral Oral  ?SpO2: 94% 92% 97% 95%  ?Weight:      ?Height:      ? ? ?Intake/Output Summary (Last 24 hours) at 06/23/2021 1128 ?Last data filed at 06/23/2021 0734 ?Gross per 24 hour  ?Intake 1800 ml  ?Output 2500 ml  ?Net -700 ml  ? ?Filed Weights  ? 06/18/21 1200  ?Weight: 102.1 kg  ? ? ?Examination: ? ? ?General exam: Overall comfortable, not in distress ?HEENT: PERRL ?Respiratory system:  no wheezes or crackles  ?Cardiovascular system: S1 & S2 heard, RRR.  ?Gastrointestinal system: Abdomen is nondistended, soft and nontender. ?Central nervous system: Alert and oriented ?Extremities: Right lower extremity wrapped with dressing ?Skin: No rashes, no ulcers,no icterus   ? ? ?Data Reviewed: I have personally reviewed following labs and imaging studies ? ?CBC: ?Recent Labs  ?Lab 06/18/21 ?1044 06/19/21 ?5329 06/20/21 ?0020 06/21/21 ?0401 06/22/21 ?0023 06/22/21 ?0600 06/22/21 ?1103 06/22/21 ?1230 06/22/21 ?1835 06/22/21 ?2214 06/23/21 ?0130 06/23/21 ?1013  ?WBC 11.3*   < > 12.6* 12.1* 14.8* 14.8*  --   --   --   --  11.9*  --   ?NEUTROABS 8.4*  --   --   --   --   --   --   --   --   --   --   --   ?HGB 15.7   < > 14.7 14.7 14.7 16.0   < > 13.9 12.2* 12.5* 12.1* 10.4*  ?HCT 47.8   < > 45.5 42.9 44.8 47.2   < > 41.0 37.1* 38.1* 35.6* 32.0*  ?MCV 90.0   < > 89.0 87.2 88.0 87.2  --   --   --   --  88.6  --   ?PLT 540*   < > 496* 410* 449* 391  --   --   --   --  400  --   ? < > = values in this interval not displayed.  ? ?Basic Metabolic Panel: ?Recent Labs  ?Lab 06/19/21 ?9242 06/20/21 ?0020 06/21/21 ?0401 06/22/21 ?0023 06/22/21 ?1103 06/22/21 ?1230 06/23/21 ?0130  ?NA 132* 129* 131* 133* 134* 133* 135  ?K 3.4* 3.8 3.2* 3.4* 3.2* 3.4* 3.6  ?CL 96* 94* 94* 93* 90*  --  97*  ?CO2 '27 23 26 29  '$ --   --  29  ?GLUCOSE 143* 142* 141* 164* 183*  --  154*  ?BUN '19 16 20 21 '$ 28*  --  25*  ?CREATININE 0.87 0.84 0.86 0.75 0.70  --  0.89  ?CALCIUM 8.8* 8.7* 8.7* 8.8*   --   --  7.6*  ? ? ? ?Recent Results (from the past 240 hour(s))  ?Resp Panel by RT-PCR (Flu A&B, Covid) Nasopharyngeal Swab     Status: None  ? Collection Time: 06/18/21 10:44 AM  ? Specimen: Nasopharyngeal Swab; Nasopharyngeal(NP) swabs in vial transport medium  ?Result Value Ref Range Status  ? SARS Coronavirus 2 by RT PCR NEGATIVE NEGATIVE Final  ?  Comment: (NOTE) ?SARS-CoV-2 target nucleic acids are NOT DETECTED. ? ?The SARS-CoV-2 RNA is generally detectable in upper respiratory ?specimens during the acute phase of infection. The  lowest ?concentration of SARS-CoV-2 viral copies this assay can detect is ?138 copies/mL. A negative result does not preclude SARS-Cov-2 ?infection and should not be used as the sole basis for treatment or ?other patient management decisions. A negative result may occur with  ?improper specimen collection/handling, submission of specimen other ?than nasopharyngeal swab, presence of viral mutation(s) within the ?areas targeted by this assay, and inadequate number of viral ?copies(<138 copies/mL). A negative result must be combined with ?clinical observations, patient history, and epidemiological ?information. The expected result is Negative. ? ?Fact Sheet for Patients:  ?EntrepreneurPulse.com.au ? ?Fact Sheet for Healthcare Providers:  ?IncredibleEmployment.be ? ?This test is no t yet approved or cleared by the [place] FDA and  ?has been authorized for detection and/or diagnosis of SARS-CoV-2 by ?FDA under an Emergency Use Authorization (EUA). This EUA will remain  ?in effect (meaning this test can be used) for the duration of the ?COVID-19 declaration under Section 564(b)(1) of the Act, 21 ?U.S.C.section 360bbb-3(b)(1), unless the authorization is terminated  ?or revoked sooner.  ? ? ?  ? Influenza A by PCR NEGATIVE NEGATIVE Final  ? Influenza B by PCR NEGATIVE NEGATIVE Final  ?  Comment: (NOTE) ?The Xpert Xpress SARS-CoV-2/FLU/RSV plus assay  is intended as an aid ?in the diagnosis of influenza from Nasopharyngeal swab specimens and ?should not be used as a sole basis for treatment. Nasal washings and ?aspirates are unacceptable for Xpert Xpr

## 2021-06-23 NOTE — Evaluation (Signed)
Occupational Therapy Evaluation Patient Details Name: Douglas [name] MRN: [id number] DOB: [birth year] Today's Date: 06/23/2021   History of Present Illness Patient is a 71 yo male admitted on 06/18/2021 with foot swelling concerning for ischemic leg. Status post bypass and thrombectomy 06/22/21. PMH: pacemaker, DM, HTN, and CLL.   Clinical Impression   Prior to this admission, patient living independently in a two story home with his wife. Patient endorses he was still driving and was independent in ADLs and IADLs. Currently, patient is requiring max A for lower body ADLs, mod A for bed mobility, and max A x2 in order to come into standing. OT unable to assist patient in standing to date, attempting x3, and successfully clearing hips on third attempt with bed elevated and max A. Patient anxious with all movement, and repeatedly asking therapist if it was ok to put weight through his R leg. Patient benefiting from step by step instruction for all activity. OT currently recommending SNF level rehab, as patient's wife works, and is requiring significant assist. If patient progresses, patient could return home with [facility] services. OT will continue to follow actuely to address problem list outlined below.      Recommendations for follow up therapy are one component of a multi-disciplinary discharge planning process, led by the attending physician.  Recommendations may be updated based on patient status, additional functional criteria and insurance authorization.   Follow Up Recommendations  Skilled nursing-short term rehab (<3 hours/day)    Assistance Recommended at Discharge Intermittent Supervision/Assistance  Patient can return home with the following Two people to help with walking and/or transfers;A lot of help with bathing/dressing/bathroom;Assist for transportation;Help with stairs or ramp for entrance;Assistance with cooking/housework    Functional Status Assessment  Patient has had a recent  decline in their functional status and demonstrates the ability to make significant improvements in function in a reasonable and predictable amount of time.  Equipment Recommendations  Other (comment) (Defer to next venue)    Recommendations for Other Services       Precautions / Restrictions Precautions Precautions: Fall Precaution Comments: A line Restrictions Weight Bearing Restrictions: No      Mobility Bed Mobility Overal bed mobility: Needs Assistance Bed Mobility: Supine to Sit, Sit to Supine     Supine to sit: Mod assist Sit to supine: Mod assist   General bed mobility comments: HOB elevated, limited mobility in shoulders requiring mod A to elevate trunk. Min A with bed pad to assist hips to sit on EOB. Mod A to return BLEs back to bed    Transfers Overall transfer level: Needs assistance Equipment used: Rolling walker (2 wheels) Transfers: Sit to/from Stand Sit to Stand: Max assist           General transfer comment: Patient unable to to clear hips with x1 assist, elevated bed, and RW. Attempted x3 and on third attempt able to clear hips minimally, would benefit from [facility] next attempt      Balance Overall balance assessment: Needs assistance Sitting-balance support: Bilateral upper extremity supported, Feet supported Sitting balance-Leahy Scale: Fair     Standing balance support: Reliant on assistive device for balance, Bilateral upper extremity supported Standing balance-Leahy Scale: Poor Standing balance comment: Reliant on BUE support                           ADL either performed or assessed with clinical judgement   ADL Overall ADL's : Needs assistance/impaired Eating/Feeding: Set up;Bed  level   Grooming: Set up;Sitting   Upper Body Bathing: Set up;Sitting   Lower Body Bathing: Maximal assistance;Sitting/lateral leans   Upper Body Dressing : Set up;Sitting   Lower Body Dressing: Maximal assistance;Sitting/lateral leans    Toilet Transfer: Maximal assistance;+2 for physical assistance;+2 for safety/equipment;Cueing for safety;Cueing for sequencing   Toileting- Clothing Manipulation and Hygiene: Maximal assistance;Sitting/lateral lean       Functional mobility during ADLs: Maximal assistance;+2 for physical assistance;+2 for safety/equipment;Rolling walker (2 wheels) General ADL Comments: Patient presenting with decreased tolerance and endurance     Vision Baseline Vision/History: 1 Wears glasses (Reading) Ability to See in Adequate Light: 0 Adequate Patient Visual Report: No change from baseline       Perception     Praxis      Pertinent Vitals/Pain Pain Assessment Pain Assessment: Faces Faces Pain Scale: Hurts a little bit Pain Location: Incisional pain with movement Pain Descriptors / Indicators: Sore, Discomfort, Guarding Pain Intervention(s): Limited activity within patient's tolerance, Monitored during session, Repositioned     Hand Dominance     Extremity/Trunk Assessment Upper Extremity Assessment Upper Extremity Assessment: Generalized weakness   Lower Extremity Assessment Lower Extremity Assessment: Defer to PT evaluation   Cervical / Trunk Assessment Cervical / Trunk Assessment: Normal   Communication Communication Communication: No difficulties   Cognition Arousal/Alertness: Awake/alert Behavior During Therapy: Anxious, WFL for tasks assessed/performed Overall Cognitive Status: Within Functional Limits for tasks assessed                                 General Comments: Patient hesistant to move as he has been in bed since 3/10 per his report, benefited from step by step instruction     General Comments       Exercises     Shoulder Instructions      Home Living Family/patient expects to be discharged to:: Private residence Living Arrangements: Spouse/significant other (Wife works from home) Available Help at Discharge: Family Type of Home:  House Home Access: Stairs to enter Secretary/administrator of Steps: 3 Entrance Stairs-Rails: Left Home Layout: Two level Alternate Level Stairs-Number of Steps: 15   Bathroom Shower/Tub: Psychologist, counselling (Built in seat)   Firefighter: Standard     Home Equipment: None   Additional Comments: Was independent and still driving, retired      Prior Functioning/Environment Prior Level of Function : Independent/Modified Independent             Mobility Comments: Independent and driving ADLs Comments: Independent        OT Problem List: Decreased strength;Decreased range of motion;Decreased activity tolerance;Impaired balance (sitting and/or standing);Decreased knowledge of use of DME or AE;Decreased knowledge of precautions;Pain      OT Treatment/Interventions: Self-care/ADL training;Therapeutic exercise;Energy conservation;DME and/or AE instruction;Manual therapy;Therapeutic activities;Patient/family education;Balance training    OT Goals(Current goals can be found in the care plan section) Acute Rehab OT Goals Patient Stated Goal: to get back to my level of independence OT Goal Formulation: With patient Time For Goal Achievement: 07/07/21 Potential to Achieve Goals: Fair  OT Frequency: Min 2X/week    Co-evaluation              AM-PAC OT "6 Clicks" Daily Activity     Outcome Measure Help from another person eating meals?: A Little Help from another person taking care of personal grooming?: A Little Help from another person toileting, which includes using toliet, bedpan, or urinal?: A  Lot Help from another person bathing (including washing, rinsing, drying)?: A Lot Help from another person to put on and taking off regular upper body clothing?: A Little Help from another person to put on and taking off regular lower body clothing?: A Lot 6 Click Score: 15   End of Session Equipment Utilized During Treatment: Gait belt;Rolling walker (2 wheels) Nurse  Communication: Mobility status;Need for lift equipment  Activity Tolerance: Patient limited by lethargy Patient left: in bed;with call bell/phone within reach;with bed alarm set  OT Visit Diagnosis: Unsteadiness on feet (R26.81);Other abnormalities of gait and mobility (R26.89);Muscle weakness (generalized) (M62.81);Pain Pain - Right/Left: Right Pain - part of body: Leg                Time: 0818-0900 OT Time Calculation (min): 42 min Charges:  OT General Charges $OT Visit: 1 Visit OT Evaluation $OT Eval Moderate Complexity: 1 Mod OT Treatments $Self Care/Home Management : 8-22 mins  Pollyann Glen E. Lauris Serviss, OTR/L Acute Rehabilitation Services [phone]   Cherlyn Cushing 06/23/2021, 9:16 AM

## 2021-06-23 NOTE — Progress Notes (Signed)
PHARMACIST LIPID MONITORING ? ? ?Douglas [name] is a 71 y.o. male admitted on 06/18/2021 with foot swelling concerning for ischemic leg.  Pharmacy has been consulted to optimize lipid-lowering therapy with the indication of secondary prevention for clinical ASCVD. ? ?Recent Labs: ? ?Lipid Panel (last 6 months):   ?Lab Results  ?Component Value Date  ? CHOL 150 06/22/2021  ? TRIG 83 06/22/2021  ? HDL 42 06/22/2021  ? CHOLHDL 3.6 06/22/2021  ? VLDL 17 06/22/2021  ? [place] 91 06/22/2021  ? ? ?Hepatic function panel (last 6 months):   ?Lab Results  ?Component Value Date  ? AST 23 06/18/2021  ? ALT 51 (H) 06/18/2021  ? ALKPHOS 109 06/18/2021  ? BILITOT 0.8 06/18/2021  ? ? ?SCr (since admission):   ?Serum creatinine: 0.89 mg/dL 06/23/21 0130 ?Estimated creatinine clearance: 92.7 mL/min ? ?Current therapy and lipid therapy tolerance ?Current lipid-lowering therapy: rosuvastatin '10mg'$  started on 3/13 ?Previous lipid-lowering therapies (if applicable): none ?Documented or reported allergies or intolerances to lipid-lowering therapies (if applicable): none ? ?Assessment:   ?Patient recently started on moderate intensity rosuvastatin, will increase to high intensity given severity of his leg ischemia and PVD. LDL was above goal at 91. I do not see that he was on a statin prior to admit.  ? ?Plan:   ? ?1.Statin intensity (high intensity recommended for all patients regardless of the LDL):  Add or increase statin to high intensity. ? ?2.Add ezetimibe (if any one of the following):   Not indicated at this time. ? ?3.Refer to lipid clinic:   No ? ?4.Follow-up with:  Primary care provider - [name], MD ? ?5.Follow-up labs after discharge:  Changes in lipid therapy were made. Check a lipid panel in 8-12 weeks then annually.    ? ?Erin Hearing PharmD., BCPS ?Clinical Pharmacist ?06/23/2021 7:45 AM ? ?

## 2021-06-23 NOTE — Progress Notes (Signed)
Pt unable to void >7hr. Bladder scan showed 381m. Obtained IN & out cath from MD. 400 ml recorded. Will continue to monitor pt.  ? ?LLavenia Atlas RN ? ?

## 2021-06-23 NOTE — Anesthesia Postprocedure Evaluation (Signed)
Anesthesia Post Note ? ?Patient: Douglas [name] ? ?Procedure(s) Performed: RIGHT FEMORAL-POPLITEAL BYPASS WITH VEIN, RIGHT POPLITEAL THROMBECTOMY (Right: Leg Upper) ? ?  ? ?Patient location during evaluation: PACU ?Anesthesia Type: General ?Level of consciousness: sedated and patient cooperative ?Pain management: pain level controlled ?Vital Signs Assessment: post-procedure vital signs reviewed and stable ?Respiratory status: spontaneous breathing ?Cardiovascular status: stable ?Anesthetic complications: no ? ? ?No notable events documented. ? ?Last Vitals:  ?Vitals:  ? 06/23/21 0422 06/23/21 0733  ?BP: 133/61 130/66  ?Pulse: 89 89  ?Resp: 18 20  ?Temp: 36.7 ?C 36.8 ?C  ?SpO2: 97% 95%  ?  ?Last Pain:  ?Vitals:  ? 06/23/21 0956  ?TempSrc:   ?PainSc: 10-Worst pain ever  ? ? ?  ?  ?  ?  ?  ?  ? ?Nolon Nations ? ? ? ? ?

## 2021-06-23 NOTE — Progress Notes (Signed)
ANTICOAGULATION CONSULT NOTE  ?Pharmacy Consult for heparin  ?Indication:  RLE ischemia  ? ?No Known Allergies ? ?Patient Measurements: ?Height: 6' (182.9 cm) ?Weight: 102.1 kg (225 lb) ?IBW/kg (Calculated) : 77.6 ?Heparin Dosing Weight: 98.5 ? ?Vital Signs: ?Temp: 98.2 ?F (36.8 ?C) (03/15 7342) ?Temp Source: Oral (03/15 8768) ?BP: 130/66 (03/15 0733) ?Pulse Rate: 89 (03/15 0733) ? ?Labs: ?Recent Labs  ?  06/21/21 ?0401 06/22/21 ?0023 06/22/21 ?0600 06/22/21 ?1103 06/22/21 ?1230 06/22/21 ?1835 06/22/21 ?2214 06/23/21 ?0130  ?HGB 14.7 14.7 16.0 15.3   < > 12.2* 12.5* 12.1*  ?HCT 42.9 44.8 47.2 45.0   < > 37.1* 38.1* 35.6*  ?PLT 410* 449* 391  --   --   --   --  400  ?HEPARINUNFRC 0.23* 0.28*  --   --   --   --   --  0.52  ?CREATININE 0.86 0.75  --  0.70  --   --   --  0.89  ? < > = values in this interval not displayed.  ? ? ? ?Estimated Creatinine Clearance: 92.7 mL/min (by C-G formula based on SCr of 0.89 mg/dL). ? ? ?Assessment: ?71 y.o. male with RLE ischemia s/p  bypass and thrombectomy 3/14. No  anticoagulation prior to admission. Pharmacy consulted for heparin, no bolus.  ? ?Coffee ground emesis noted this am, pantoprazole infusion started. Given vascular issues heparin has been continued for now, will aim for low end of goal. Hemoglobin down to 10.4 this am, heparin level well above goal. Given notable bleeding as adverse effect of imbruvica will hold that therapy for now and aim to keep heparin infusion at low end of goal. ? ?Goal of Therapy:  ?Heparin level 0.3-0.5 units/ml - aiming for low end of goal ?Monitor platelets by anticoagulation protocol: Yes ?  ?Plan:  ?Reduce heparin to 2200 units/hr ?Recheck level tonight ? ?Erin Hearing PharmD., BCPS ?Clinical Pharmacist ?06/23/2021 7:37 AM ? ?

## 2021-06-23 NOTE — Progress Notes (Signed)
ANTICOAGULATION CONSULT NOTE  ?Pharmacy Consult for heparin  ?Indication:  RLE ischemia  ? ?No Known Allergies ? ?Patient Measurements: ?Height: 6' (182.9 cm) ?Weight: 102.1 kg (225 lb) ?IBW/kg (Calculated) : 77.6 ?Heparin Dosing Weight: 98.5 ? ?Vital Signs: ?Temp: 98 ?F (36.7 ?C) (03/14 2348) ?Temp Source: Oral (03/14 2348) ?BP: 124/55 (03/14 2348) ?Pulse Rate: 86 (03/14 2348) ? ?Labs: ?Recent Labs  ?  06/21/21 ?0401 06/22/21 ?0023 06/22/21 ?0600 06/22/21 ?1103 06/22/21 ?1230 06/22/21 ?1835 06/22/21 ?2214 06/23/21 ?0130  ?HGB 14.7 14.7 16.0 15.3   < > 12.2* 12.5* 12.1*  ?HCT 42.9 44.8 47.2 45.0   < > 37.1* 38.1* 35.6*  ?PLT 410* 449* 391  --   --   --   --  400  ?HEPARINUNFRC 0.23* 0.28*  --   --   --   --   --  0.52  ?CREATININE 0.86 0.75  --  0.70  --   --   --  0.89  ? < > = values in this interval not displayed.  ? ? ? ?Estimated Creatinine Clearance: 92.7 mL/min (by C-G formula based on SCr of 0.89 mg/dL). ? ? ?Assessment: ?71 y.o. male with RLE ischemia s/p  bypass and thrombectomy 3/14. No  anticoagulation prior to admission. Pharmacy consulted for heparin, no bolus.  ? ?Previously subtherapeutic on 2250 units/hr and increased to 2500 units/hr. Infusion was stopped for OR before level could be drawn.  ? ?3/15 AM update;  ?Heparin level therapeutic ? ?Goal of Therapy:  ?Heparin level 0.3-0.7 units/ml ?Monitor platelets by anticoagulation protocol: Yes ?  ?Plan:  ?Cont heparin at 2500 units/hr ?1200 heparin level ? ?Narda Bonds, PharmD, BCPS ?Clinical Pharmacist ?Phone: [phone] ? ? ? ?

## 2021-06-23 NOTE — Progress Notes (Signed)
ANTICOAGULATION CONSULT NOTE  ?Pharmacy Consult for heparin  ?Indication:  RLE ischemia  ? ?No Known Allergies ? ?Patient Measurements: ?Height: 6' (182.9 cm) ?Weight: 102.1 kg (225 lb) ?IBW/kg (Calculated) : 77.6 ?Heparin Dosing Weight: 98.5 ? ?Vital Signs: ?Temp: 98.5 ?F (36.9 ?C) (03/15 1954) ?Temp Source: Oral (03/15 1954) ?BP: 142/60 (03/15 1954) ?Pulse Rate: 88 (03/15 1954) ? ?Labs: ?Recent Labs  ?  06/22/21 ?0023 06/22/21 ?0600 06/22/21 ?1103 06/22/21 ?1230 06/23/21 ?0130 06/23/21 ?1013 06/23/21 ?1153 06/23/21 ?1812  ?HGB 14.7 16.0 15.3   < > 12.1* 10.4*  --  11.5*  ?HCT 44.8 47.2 45.0   < > 35.6* 32.0*  --  35.8*  ?PLT 449* 391  --   --  400  --   --   --   ?HEPARINUNFRC 0.28*  --   --   --  0.52  --  0.84* 0.78*  ?CREATININE 0.75  --  0.70  --  0.89  --   --   --   ? < > = values in this interval not displayed.  ? ? ? ?Estimated Creatinine Clearance: 92.7 mL/min (by C-G formula based on SCr of 0.89 mg/dL). ? ? ?Assessment: ?71 y.o. male with RLE ischemia s/p  bypass and thrombectomy 3/14. No  anticoagulation prior to admission. Pharmacy consulted for heparin, no bolus.  ? ?AM PharmD:Coffee ground emesis noted this am, pantoprazole infusion started. Given vascular issues heparin has been continued for now, will aim for low end of goal. Hemoglobin down to 10.4 this am, heparin level well above goal. Given notable bleeding as adverse effect of imbruvica will hold that therapy for now and aim to keep heparin infusion at low end of goal. ? ?PM PharmD: Heparin level remains elevated at 0.78 (on 2200 units/hr) ?No further signs/symptoms of bleed noted per nurse ? ?Goal of Therapy:  ?Heparin level 0.3-0.5 units/ml - aiming for low end of goal ?Monitor platelets by anticoagulation protocol: Yes ?  ?Plan:  ?Hold heparin drip for 30 minutes and resume heparin drip at 1900 units/hr  ?Heparin level with am labs ?Daily heparin level and CBC ordered ?Monitor for signs/symptoms of bleed ? ?Thank you for allowing pharmacy  to be a part of this patient?s care. ? ?Donnald Garre, PharmD ?Clinical Pharmacist ? ?Please check AMION for all [place] numbers ?After 10:00 PM, call [place] [phone] ? ? ?

## 2021-06-23 NOTE — Assessment & Plan Note (Addendum)
PT/OT recommending CIR on discharge ?

## 2021-06-23 NOTE — Progress Notes (Signed)
Removed A-line and foley cath per order. Pt tolerated well. Will continue to monitor the pt. ? ?Lavenia Atlas, RN ? ?

## 2021-06-24 DIAGNOSIS — I70221 Atherosclerosis of native arteries of extremities with rest pain, right leg: Secondary | ICD-10-CM | POA: Diagnosis not present

## 2021-06-24 LAB — CBC
HCT: 31.5 % — ABNORMAL LOW (ref 39.0–52.0)
Hemoglobin: 10.4 g/dL — ABNORMAL LOW (ref 13.0–17.0)
MCH: 29.6 pg (ref 26.0–34.0)
MCHC: 33 g/dL (ref 30.0–36.0)
MCV: 89.7 fL (ref 80.0–100.0)
Platelets: 370 10*3/uL (ref 150–400)
RBC: 3.51 MIL/uL — ABNORMAL LOW (ref 4.22–5.81)
RDW: 15.6 % — ABNORMAL HIGH (ref 11.5–15.5)
WBC: 10.1 10*3/uL (ref 4.0–10.5)
nRBC: 0 % (ref 0.0–0.2)

## 2021-06-24 LAB — GLUCOSE, CAPILLARY
Glucose-Capillary: 120 mg/dL — ABNORMAL HIGH (ref 70–99)
Glucose-Capillary: 137 mg/dL — ABNORMAL HIGH (ref 70–99)
Glucose-Capillary: 143 mg/dL — ABNORMAL HIGH (ref 70–99)
Glucose-Capillary: 151 mg/dL — ABNORMAL HIGH (ref 70–99)

## 2021-06-24 LAB — HEMOGLOBIN AND HEMATOCRIT, BLOOD
HCT: 32.6 % — ABNORMAL LOW (ref 39.0–52.0)
HCT: 31.4 % — ABNORMAL LOW (ref 39.0–52.0)
Hemoglobin: 10.7 g/dL — ABNORMAL LOW (ref 13.0–17.0)
Hemoglobin: 10.3 g/dL — ABNORMAL LOW (ref 13.0–17.0)

## 2021-06-24 LAB — HEPARIN LEVEL (UNFRACTIONATED)
Heparin Unfractionated: 0.39 IU/mL (ref 0.30–0.70)
Heparin Unfractionated: 0.36 IU/mL (ref 0.30–0.70)

## 2021-06-24 MED ORDER — POLYETHYLENE GLYCOL 3350 17 G PO PACK
17.0000 g | PACK | Freq: Every day | ORAL | Status: DC
  Administered 2021-06-24 – 2021-06-27 (×3): 17 g via ORAL
  Filled 2021-06-24 (×3): qty 1

## 2021-06-24 MED ORDER — SENNA 8.6 MG PO TABS
1.0000 | ORAL_TABLET | Freq: Two times a day (BID) | ORAL | Status: DC
  Administered 2021-06-24 – 2021-07-01 (×13): 8.6 mg via ORAL
  Filled 2021-06-24 (×13): qty 1

## 2021-06-24 NOTE — Progress Notes (Signed)
ANTICOAGULATION CONSULT NOTE - Follow Up Consult ? ?Pharmacy Consult for heparin ?Indication:  RLE ischemia ? ?No Known Allergies ? ?Patient Measurements: ?Height: 6' (182.9 cm) ?Weight: 102.1 kg (225 lb) ?IBW/kg (Calculated) : 77.6 ?Heparin Dosing Weight: 98.5 ? ?Vital Signs: ?Temp: 98.5 ?F (36.9 ?C) (03/15 2328) ?Temp Source: Oral (03/15 2328) ?BP: 139/57 (03/15 2328) ?Pulse Rate: 90 (03/15 2328) ? ?Labs: ?Recent Labs  ?  06/22/21 ?0023 06/22/21 ?0600 06/22/21 ?1103 06/22/21 ?1230 06/23/21 ?0130 06/23/21 ?1013 06/23/21 ?1153 06/23/21 ?1812 06/24/21 ?0203  ?HGB 14.7 16.0 15.3   < > 12.1* 10.4*  --  11.5* 10.7*  ?HCT 44.8 47.2 45.0   < > 35.6* 32.0*  --  35.8* 32.6*  ?PLT 449* 391  --   --  400  --   --   --   --   ?HEPARINUNFRC 0.28*  --   --   --  0.52  --  0.84* 0.78* 0.39  ?CREATININE 0.75  --  0.70  --  0.89  --   --   --   --   ? < > = values in this interval not displayed.  ? ? ?Estimated Creatinine Clearance: 92.7 mL/min (by C-G formula based on SCr of 0.89 mg/dL). ? ?Assessment: ?71 y.o. male with RLE ischemia s/p bypass and thrombectomy 3/14. No anticoagulation prior to admission. Pharmacy consulted for heparin, no bolus. ?  ?AM PharmD (3/15):Coffee ground emesis noted this am, pantoprazole infusion started. Given vascular issues heparin has been continued for now, will aim for low end of goal. Hemoglobin down to 10.4 this am, heparin level well above goal. Given notable bleeding as adverse effect of imbruvica will hold that therapy for now and aim to keep heparin infusion at low end of goal. ? ?PM PharmD (3/15): Heparin level remains elevated at 0.78 (on 2200 units/hr) ?No further signs/symptoms of bleed noted per nurse. ? ?Heparin level returned therapeutic at 0.39 on 1900 units/hr. No further signs/symptoms of bleed noted per nurse. No issues with infusion. ? ?Goal of Therapy:  ?Heparin level 0.3-0.5 units/ml ?Monitor platelets by anticoagulation protocol: Yes ?  ?Plan:  ?Continue heparin infusion at  1900 units/hr.  ?Continue to target low end of goal due to vascular issues. ?Check anti-Xa level in 8 hours and daily while on heparin. ?Continue to monitor H&H and platelets. ? ?Carma Lair, PharmD Candidate [phone] ?06/24/2021,3:49 AM ? ? ?

## 2021-06-24 NOTE — Plan of Care (Signed)

## 2021-06-24 NOTE — Progress Notes (Signed)
Nutrition Follow-up ? ?DOCUMENTATION CODES:  ? ?Non-severe (moderate) malnutrition in context of acute illness/injury ? ?INTERVENTION:  ? ?High Protein snacks TID between meals. ? ?MVI with minerals daily. ? ?D/C Ensure supplements, patient does not like them.  ? ?NUTRITION DIAGNOSIS:  ? ?Moderate Malnutrition related to acute illness (R foot ischemia) as evidenced by mild muscle depletion, mild fat depletion. ? ?Ongoing  ? ?GOAL:  ? ?Patient will meet greater than or equal to 90% of their needs ? ?Progressing  ? ?MONITOR:  ? ?PO intake, Supplement acceptance, Labs, Skin ? ?REASON FOR ASSESSMENT:  ? ?Consult ?Wound healing ? ?ASSESSMENT:  ? ?71 y.o. male with history of CLL, chronic fascitis, DM type 2, and HTN sent to ED from podiatry appoint for concerns of ischemia to the right foot. ? ?S/P right common femoral to below-knee popliteal artery bypass 3/13. ?Plans for right BKA tomorrow as foot appears worse today.  ?Patient to be NPO after midnight tonight. ? ?Patient states he was eating poorly earlier this week, but he had a good meal last night for dinner and this morning for breakfast. He does not want to receive the Ensure supplements any more because he vomited after he tried one earlier this week. Discussed the importance of adequate protein and calorie intake to promote healing. Patient requests snacks between meals. Discussed high protein snack options with patient. ? ?Labs reviewed.  ?CBG: [phone] ? ?Medications reviewed and include Colace, HCTZ, Novolog, MVI with minerals, Miralax, Senokot. ? ? ?NUTRITION - FOCUSED PHYSICAL EXAM: ? ?Flowsheet Row Most Recent Value  ?Orbital Region Mild depletion  ?Upper Arm Region No depletion  ?Thoracic and Lumbar Region Mild depletion  ?Buccal Region Mild depletion  ?Temple Region Mild depletion  ?Clavicle Bone Region Mild depletion  ?Clavicle and Acromion Bone Region Mild depletion  ?Scapular Bone Region No depletion  ?Dorsal Hand No depletion  ?Patellar Region  Mild depletion  ?Anterior Thigh Region Mild depletion  ?Posterior Calf Region Mild depletion  ?Edema (RD Assessment) Moderate  ?Hair Reviewed  ?Eyes Reviewed  ?Mouth Reviewed  ?Skin Reviewed  ?Nails Reviewed  ? ?  ? ? ?Diet Order:   ?Diet Order   ? ?       ?  Diet regular Room service appropriate? Yes; Fluid consistency: Thin  Diet effective now       ?  ? ?  ?  ? ?  ? ? ?EDUCATION NEEDS:  ? ?Education needs have been addressed ? ?Skin:  Skin Assessment: Reviewed RN Assessment ? ?Last BM:  3/12 ? ?Height:  ? ?Ht Readings from Last 1 Encounters:  ?06/18/21 6' (1.829 m)  ? ? ?Weight:  ? ?Wt Readings from Last 1 Encounters:  ?06/18/21 102.1 kg  ? ? ?Ideal Body Weight:  80.9 kg ? ?BMI:  Body mass index is 30.52 kg/m?. ? ?Estimated Nutritional Needs:  ? ?Kcal:  2300-2500 kcal/d ? ?Protein:  120-130g/d ? ?Fluid:  2.4-2.5 L/d ? ? ? ?Lucas Mallow RD, LDN, CNSC ?Please refer to Amion for contact information.                                                       ? ?

## 2021-06-24 NOTE — Assessment & Plan Note (Signed)
Continue bowel regimen. ?

## 2021-06-24 NOTE — Progress Notes (Addendum)
?  Progress Note ? ? ? ?06/24/2021 ?7:03 AM ?2 Days Post-Op ? ?Subjective:  says he does not feel his foot ? ?Afebrile ?HR 70's-90's  ?242'P-536'R systolic ?44% [place] ? ?Vitals:  ? 06/23/21 2328 06/24/21 0410  ?BP: (!) 139/57 119/61  ?Pulse: 90 85  ?Resp: 18 17  ?Temp: 98.5 ?F (36.9 ?C) 97.9 ?F (36.6 ?C)  ?SpO2: 95% 95%  ? ? ?Physical Exam: ?Cardiac:  regular ?Lungs:  non labored ?Incisions:  right groin and all other incisions look fine ?Extremities:  faint right DP doppler signal.  Unable to obtain AT; right foot worse today with mottling and cold.  ?Abdomen:  soft ? ?CBC ?   ?Component Value Date/Time  ? WBC 11.9 (H) 06/23/2021 0130  ? RBC 4.02 (L) 06/23/2021 0130  ? HGB 10.7 (L) 06/24/2021 0203  ? HGB 14.9 05/04/2021 0752  ? HCT 32.6 (L) 06/24/2021 0203  ? PLT 400 06/23/2021 0130  ? PLT 352 05/04/2021 0752  ? MCV 88.6 06/23/2021 0130  ? MCH 30.1 06/23/2021 0130  ? MCHC 34.0 06/23/2021 0130  ? RDW 15.5 06/23/2021 0130  ? LYMPHSABS 2.2 06/18/2021 1044  ? MONOABS 0.5 06/18/2021 1044  ? EOSABS 0.1 06/18/2021 1044  ? BASOSABS 0.1 06/18/2021 1044  ? ? ?BMET ?   ?Component Value Date/Time  ? NA 135 06/23/2021 0130  ? K 3.6 06/23/2021 0130  ? CL 97 (L) 06/23/2021 0130  ? CO2 29 06/23/2021 0130  ? GLUCOSE 154 (H) 06/23/2021 0130  ? BUN 25 (H) 06/23/2021 0130  ? CREATININE 0.89 06/23/2021 0130  ? CREATININE 0.94 05/04/2021 0752  ? CALCIUM 7.6 (L) 06/23/2021 0130  ? GFRNONAA >60 06/23/2021 0130  ? GFRNONAA >60 05/04/2021 0752  ? GFRAA >60 01/08/2020 0810  ? ? ?INR ?   ?Component Value Date/Time  ? INR 1.0 06/18/2021 1044  ? ? ? ?Intake/Output Summary (Last 24 hours) at 06/24/2021 0703 ?Last data filed at 06/24/2021 0513 ?Gross per 24 hour  ?Intake 3265.19 ml  ?Output 1200 ml  ?Net 2065.19 ml  ? ? ? ?Assessment/Plan:  71 y.o. male is s/p:  ?1.  Right common femoral to below-knee popliteal artery bypass with nonreversed ipsilateral translocated greater saphenous vein ?2.  [place] right greater saphenous vein ?3.  Right posterior  tibial ankle exposure and thrombectomy with instillation 5 mg tPA   ?2 Days Post-Op ? ? ?-pt with very faint right PT doppler signal.  Foot appears worse today with increased mottling and cold.  Most likely will require amputation.  Currently no infection or pain.  ?-DVT prophylaxis:  heparin gtt ?-Dr. Donzetta Matters to see pt later this morning ? ? ?Leontine Locket, PA-C ?[facility] ?[phone] ?06/24/2021 ?7:03 AM ? ? ?I have independently interviewed and examined patient and agree with PA assessment and plan above. Plan for right bka tomorrow in OR. His leg is warm to the level of the ankle.  ? ?Damany Eastman C. Donzetta Matters, MD ?[facility] ?Office: [phone] ?Pager: [phone] ? ? ?

## 2021-06-24 NOTE — Progress Notes (Signed)
?  Inpatient Rehab Admissions Coordinator : ? ?Per therapy recommendations patient was screened for CIR candidacy by Danne Baxter RN MSN. Noted additional surgical procedures likely.  Patient may have the potential to progress to become a candidate. The CIR admissions team will follow and monitor for progress and place a Rehab Consult order if felt to be appropriate. Please contact me with any questions. ? ?Danne Baxter RN MSN ?Admissions Coordinator ?[phone]  ?

## 2021-06-24 NOTE — Progress Notes (Signed)
ANTICOAGULATION CONSULT NOTE - Follow Up Consult ? ?Pharmacy Consult for heparin ?Indication:  RLE ischemia ? ?No Known Allergies ? ?Patient Measurements: ?Height: 6' (182.9 cm) ?Weight: 102.1 kg (225 lb) ?IBW/kg (Calculated) : 77.6 ?Heparin Dosing Weight: 98.5 ? ?Vital Signs: ?Temp: 97.9 ?F (36.6 ?C) (03/16 0410) ?Temp Source: Oral (03/16 0410) ?BP: 119/61 (03/16 0410) ?Pulse Rate: 85 (03/16 0410) ? ?Labs: ?Recent Labs  ?  06/22/21 ?0023 06/22/21 ?0600 06/22/21 ?1103 06/22/21 ?1230 06/23/21 ?0130 06/23/21 ?1013 06/23/21 ?1153 06/23/21 ?1812 06/24/21 ?0203  ?HGB 14.7 16.0 15.3   < > 12.1* 10.4*  --  11.5* 10.7*  ?HCT 44.8 47.2 45.0   < > 35.6* 32.0*  --  35.8* 32.6*  ?PLT 449* 391  --   --  400  --   --   --   --   ?HEPARINUNFRC 0.28*  --   --   --  0.52  --  0.84* 0.78* 0.39  ?CREATININE 0.75  --  0.70  --  0.89  --   --   --   --   ? < > = values in this interval not displayed.  ? ? ? ?Estimated Creatinine Clearance: 92.7 mL/min (by C-G formula based on SCr of 0.89 mg/dL). ? ?Assessment: ?71 y.o. male with RLE ischemia s/p bypass and thrombectomy 3/14. No anticoagulation prior to admission. Pharmacy consulted for heparin, no bolus. ?  ?Heparin level returned therapeutic at 0.36 on 1900 units/hr. No further signs/symptoms of bleed noted per nurse. No issues with infusion. ? ?Goal of Therapy:  ?Heparin level 0.3-0.5 units/ml ?Monitor platelets by anticoagulation protocol: Yes ?  ?Plan:  ?Continue heparin infusion at 1900 units/hr.  ?Continue to target low end of goal due to bleeding ?Check anti-Xa level daily while on heparin. ?Continue to monitor H&H and platelets. ? ?Erin Hearing PharmD., BCPS ?Clinical Pharmacist ?06/24/2021 7:41 AM ? ?

## 2021-06-24 NOTE — Progress Notes (Signed)
?PROGRESS NOTE ? ?Douglas [name]  WCB:[id number] DOB: 1951-02-15 DOA: 06/18/2021 ?PCP: [name], MD  ? ?Brief Narrative: ?Patient is a 71 year old male with history of BPH, CLL, hypertension who presented with right foot swelling, purple discoloration, pain, inability to ambulate.  On presentation he was found to have ischemic right lower extremity, started on heparin drip.  Vascular surgery consulted.  Found to have critical right lower extremity ischemia, status post right  lower extremity angiogram.  Vascular surgery did  bypass surgery with thrombectomy.  Hospital course also remarkable for coffee-ground emesis with a stable hemoglobin.  Vascular surgery considering amputation of right lower extremity. ? ?Assessment & Plan: ? ?Principal Problem: ?  Critical limb ischemia of right lower extremity ([place]) ?Active Problems: ?  Coffee ground emesis ?  Physical debility ?  CLL (chronic lymphocytic leukemia) ([place]) ?  Hypertension ?  Diabetes mellitus ([place]) ?  BPH (benign prostatic hyperplasia) ?  Pacemaker ?  Constipation ? ? ?Assessment and Plan: ?* Critical limb ischemia of right lower extremity ([name]) ?-Patient with h/o plantar fasciitis, last received injection for this on 2/17 ?-Within the next 1-2 weeks, he developed foot swelling, pain, discoloration ?-Upon arrival in the podiatry office, there was concern for limb ischemia and he was sent to the ER ?-He was started on heparin infusion ?-ABIs showed severe right lower extremity arterial disease, moderate left lower extremity arterial disease ?-Found to have critical right lower extremity ischemia.  Status post angiogram.  Vascular surgery did bypass, thrombectomy on 3/14. ?-There is a concern from vascular surgery that his bypass procedure will fail and he might need amputation ? ?Coffee ground emesis ?Had significant coffee-ground emesis on 3/14.  There was about 800 cc of dark emesis collected in a can.  Interestingly, patient does not have any symptoms, no  abdominal pain, nausea or vomiting.  Hemoglobin has dropped from baseline but has remained stable now.  Started on Protonix drip.  There is necessity to continue heparin drip for now as per vascular surgery.  We will closely monitor H&H.  Will monitor for further episodes of bleeding. ? ?Physical debility ?PT/OT recommending CIR on discharge ? ?CLL (chronic lymphocytic leukemia) ([name]) ?-Diagnosed in 11/2018.   ?-Has done well with ibrutinib, and is continuing this medication. ?-Followed by Dr. [name]. ? ? ?Constipation ?Continue bowel regimen ? ?Pacemaker ?-High grade heart block ?-Pacer in place and working well in January per [name] ?-[name] was performed in 01/2021 pre-procedure with no obvious obstructive CAD ? ?BPH (benign prostatic hyperplasia) ?-continue terazosin. Has intermittent issues of urinary retention ? ?Diabetes mellitus ([place]) ?-Last A1c was 6.7 ?-He is not on medications at home ?-Continue with sensitive-scale SSI ? ?Hypertension ?-Continue ARB, amlodipine, and terazosin ?-Continue prn IV hydralazine ? ? ? ? ? ?Nutrition Problem: Inadequate oral intake ?Etiology: decreased appetite ?  ? ?DVT prophylaxis:SCD's Start: 06/22/21 1712IV heparin ? ? ?  Code Status: Full Code ? ?Family Communication: Called and discussed with wife on phone on 3/16 ? ?Patient status:Inpatient ? ?Patient is from :Home ? ?Anticipated discharge to: Acute inpatient rehab ? ?Estimated DC date:Not sure yet ? ? ?Consultants: Vascular surgery ? ?Procedures:None  ? ?Antimicrobials:  ?Anti-infectives (From admission, onward)  ? ? Start     Dose/Rate Route Frequency Ordered Stop  ? 06/22/21 1800  ceFAZolin (ANCEF) IVPB 2g/100 mL premix       ? 2 g ?200 mL/hr over 30 Minutes Intravenous Every 8 hours 06/22/21 1712 06/23/21 2040  ? ?  ? ? ?Subjective: ? ?  Patient seen and examined at the bedside this morning.  Hemodynamically stable.  Complains of lack of bowel movement.  Denies any nausea, vomiting or abdominal pain.  No coffee-ground  emesis or bowel movement after the last episode. ? ?Objective: ?Vitals:  ? 06/23/21 1954 06/23/21 2328 06/24/21 0410 06/24/21 0855  ?BP: (!) 142/60 (!) 139/57 119/61 124/77  ?Pulse: 88 90 85 85  ?Resp: '18 18 17 18  '$ ?Temp: 98.5 ?F (36.9 ?C) 98.5 ?F (36.9 ?C) 97.9 ?F (36.6 ?C) 97.8 ?F (36.6 ?C)  ?TempSrc: Oral Oral Oral Oral  ?SpO2: 92% 95% 95% 97%  ?Weight:      ?Height:      ? ? ?Intake/Output Summary (Last 24 hours) at 06/24/2021 1058 ?Last data filed at 06/24/2021 0513 ?Gross per 24 hour  ?Intake 3265.19 ml  ?Output 1100 ml  ?Net 2165.19 ml  ? ?Filed Weights  ? 06/18/21 1200  ?Weight: 102.1 kg  ? ? ?Examination: ? ? ? ?General exam: Overall comfortable, not in distress ?HEENT: PERRL ?Respiratory system:  no wheezes or crackles  ?Cardiovascular system: S1 & S2 heard, RRR.  ?Gastrointestinal system: Abdomen is nondistended, soft and nontender. ?Central nervous system: Alert and oriented ?Extremities: Bluish discoloration of right foot, edema, surgical wounds ?Skin: No rashes, no ulcers,no icterus   ? ? ?Data Reviewed: I have personally reviewed following labs and imaging studies ? ?CBC: ?Recent Labs  ?Lab 06/18/21 ?1044 06/19/21 ?0272 06/21/21 ?0401 06/22/21 ?0023 06/22/21 ?0600 06/22/21 ?1103 06/23/21 ?0130 06/23/21 ?1013 06/23/21 ?1812 06/24/21 ?0203 06/24/21 ?5366  ?WBC 11.3*   < > 12.1* 14.8* 14.8*  --  11.9*  --   --   --  10.1  ?NEUTROABS 8.4*  --   --   --   --   --   --   --   --   --   --   ?HGB 15.7   < > 14.7 14.7 16.0   < > 12.1* 10.4* 11.5* 10.7* 10.4*  ?HCT 47.8   < > 42.9 44.8 47.2   < > 35.6* 32.0* 35.8* 32.6* 31.5*  ?MCV 90.0   < > 87.2 88.0 87.2  --  88.6  --   --   --  89.7  ?PLT 540*   < > 410* 449* 391  --  400  --   --   --  370  ? < > = values in this interval not displayed.  ? ?Basic Metabolic Panel: ?Recent Labs  ?Lab 06/19/21 ?4403 06/20/21 ?0020 06/21/21 ?0401 06/22/21 ?0023 06/22/21 ?1103 06/22/21 ?1230 06/23/21 ?0130  ?NA 132* 129* 131* 133* 134* 133* 135  ?K 3.4* 3.8 3.2* 3.4* 3.2* 3.4*  3.6  ?CL 96* 94* 94* 93* 90*  --  97*  ?CO2 '27 23 26 29  '$ --   --  29  ?GLUCOSE 143* 142* 141* 164* 183*  --  154*  ?BUN '19 16 20 21 '$ 28*  --  25*  ?CREATININE 0.87 0.84 0.86 0.75 0.70  --  0.89  ?CALCIUM 8.8* 8.7* 8.7* 8.8*  --   --  7.6*  ? ? ? ?Recent Results (from the past 240 hour(s))  ?Resp Panel by RT-PCR (Flu A&B, Covid) Nasopharyngeal Swab     Status: None  ? Collection Time: 06/18/21 10:44 AM  ? Specimen: Nasopharyngeal Swab; Nasopharyngeal(NP) swabs in vial transport medium  ?Result Value Ref Range Status  ? SARS Coronavirus 2 by RT PCR NEGATIVE NEGATIVE Final  ?  Comment: (NOTE) ?SARS-CoV-2 target nucleic acids  are NOT DETECTED. ? ?The SARS-CoV-2 RNA is generally detectable in upper respiratory ?specimens during the acute phase of infection. The lowest ?concentration of SARS-CoV-2 viral copies this assay can detect is ?138 copies/mL. A negative result does not preclude SARS-Cov-2 ?infection and should not be used as the sole basis for treatment or ?other patient management decisions. A negative result may occur with  ?improper specimen collection/handling, submission of specimen other ?than nasopharyngeal swab, presence of viral mutation(s) within the ?areas targeted by this assay, and inadequate number of viral ?copies(<138 copies/mL). A negative result must be combined with ?clinical observations, patient history, and epidemiological ?information. The expected result is Negative. ? ?Fact Sheet for Patients:  ?EntrepreneurPulse.com.au ? ?Fact Sheet for Healthcare Providers:  ?IncredibleEmployment.be ? ?This test is no t yet approved or cleared by the [place] FDA and  ?has been authorized for detection and/or diagnosis of SARS-CoV-2 by ?FDA under an Emergency Use Authorization (EUA). This EUA will remain  ?in effect (meaning this test can be used) for the duration of the ?COVID-19 declaration under Section 564(b)(1) of the Act, 21 ?U.S.C.section 360bbb-3(b)(1),  unless the authorization is terminated  ?or revoked sooner.  ? ? ?  ? Influenza A by PCR NEGATIVE NEGATIVE Final  ? Influenza B by PCR NEGATIVE NEGATIVE Final  ?  Comment: (NOTE) ?The Xpert Xpress SARS-CoV-

## 2021-06-25 ENCOUNTER — Other Ambulatory Visit: Payer: Self-pay

## 2021-06-25 ENCOUNTER — Encounter (HOSPITAL_COMMUNITY): Payer: Self-pay | Admitting: Internal Medicine

## 2021-06-25 ENCOUNTER — Encounter (HOSPITAL_COMMUNITY)
Admission: EM | Disposition: A | Discharge status: Rehabilitation Facility | Payer: Self-pay | Source: Home / Self Care | Attending: Internal Medicine

## 2021-06-25 ENCOUNTER — Inpatient Hospital Stay (HOSPITAL_COMMUNITY): Payer: 59 | Admitting: Anesthesiology

## 2021-06-25 DIAGNOSIS — I70221 Atherosclerosis of native arteries of extremities with rest pain, right leg: Secondary | ICD-10-CM | POA: Diagnosis not present

## 2021-06-25 DIAGNOSIS — I10 Essential (primary) hypertension: Secondary | ICD-10-CM

## 2021-06-25 DIAGNOSIS — E876 Hypokalemia: Secondary | ICD-10-CM

## 2021-06-25 DIAGNOSIS — I252 Old myocardial infarction: Secondary | ICD-10-CM

## 2021-06-25 DIAGNOSIS — E1151 Type 2 diabetes mellitus with diabetic peripheral angiopathy without gangrene: Secondary | ICD-10-CM

## 2021-06-25 HISTORY — PX: AMPUTATION: SHX166

## 2021-06-25 LAB — GLUCOSE, CAPILLARY
Glucose-Capillary: 141 mg/dL — ABNORMAL HIGH (ref 70–99)
Glucose-Capillary: 132 mg/dL — ABNORMAL HIGH (ref 70–99)
Glucose-Capillary: 172 mg/dL — ABNORMAL HIGH (ref 70–99)
Glucose-Capillary: 264 mg/dL — ABNORMAL HIGH (ref 70–99)
Glucose-Capillary: 177 mg/dL — ABNORMAL HIGH (ref 70–99)

## 2021-06-25 LAB — BASIC METABOLIC PANEL
Anion gap: 10 (ref 5–15)
BUN: 17 mg/dL (ref 8–23)
CO2: 28 mmol/L (ref 22–32)
Calcium: 7.6 mg/dL — ABNORMAL LOW (ref 8.9–10.3)
Chloride: 93 mmol/L — ABNORMAL LOW (ref 98–111)
Creatinine, Ser: 0.73 mg/dL (ref 0.61–1.24)
GFR, Estimated: >60 mL/min (ref >60)
Glucose, Bld: 161 mg/dL — ABNORMAL HIGH (ref 70–99)
Potassium: 3.3 mmol/L — ABNORMAL LOW (ref 3.5–5.1)
Sodium: 131 mmol/L — ABNORMAL LOW (ref 135–145)

## 2021-06-25 LAB — HEPARIN LEVEL (UNFRACTIONATED): Heparin Unfractionated: 0.27 IU/mL — ABNORMAL LOW (ref 0.30–0.70)

## 2021-06-25 LAB — HEMOGLOBIN AND HEMATOCRIT, BLOOD
HCT: 31 % — ABNORMAL LOW (ref 39.0–52.0)
Hemoglobin: 10 g/dL — ABNORMAL LOW (ref 13.0–17.0)

## 2021-06-25 SURGERY — AMPUTATION BELOW KNEE
Anesthesia: Regional | Site: Knee | Laterality: Right

## 2021-06-25 MED ORDER — LACTATED RINGERS IV SOLN: INTRAVENOUS | Status: DC

## 2021-06-25 MED ORDER — 0.9 % SODIUM CHLORIDE (POUR BTL) OPTIME
TOPICAL | Status: DC | PRN
  Administered 2021-06-25: 1000 mL

## 2021-06-25 MED ORDER — ACETAMINOPHEN 500 MG PO TABS
ORAL_TABLET | ORAL | Status: AC
  Administered 2021-06-25: 1000 mg via ORAL
  Filled 2021-06-25: qty 2

## 2021-06-25 MED ORDER — ONDANSETRON HCL 4 MG/2ML IJ SOLN
INTRAMUSCULAR | Status: AC
  Filled 2021-06-25: qty 2

## 2021-06-25 MED ORDER — PROPOFOL 500 MG/50ML IV EMUL
INTRAVENOUS | Status: DC | PRN
  Administered 2021-06-25: 100 ug/kg/min via INTRAVENOUS

## 2021-06-25 MED ORDER — FENTANYL CITRATE (PF) 100 MCG/2ML IJ SOLN
INTRAMUSCULAR | Status: AC
  Administered 2021-06-25: 50 ug via INTRAVENOUS
  Filled 2021-06-25: qty 2

## 2021-06-25 MED ORDER — PROPOFOL 10 MG/ML IV BOLUS
INTRAVENOUS | Status: AC
  Filled 2021-06-25: qty 20

## 2021-06-25 MED ORDER — LIDOCAINE 2% (20 MG/ML) 5 ML SYRINGE
INTRAMUSCULAR | Status: AC
  Filled 2021-06-25: qty 5

## 2021-06-25 MED ORDER — CEFAZOLIN SODIUM-DEXTROSE 2-4 GM/100ML-% IV SOLN
INTRAVENOUS | Status: AC
  Filled 2021-06-25: qty 100

## 2021-06-25 MED ORDER — CEFAZOLIN SODIUM-DEXTROSE 2-4 GM/100ML-% IV SOLN
2.0000 g | Freq: Once | INTRAVENOUS | Status: AC
  Administered 2021-06-25: 2 g via INTRAVENOUS

## 2021-06-25 MED ORDER — FENTANYL CITRATE (PF) 250 MCG/5ML IJ SOLN
INTRAMUSCULAR | Status: AC
  Filled 2021-06-25: qty 5

## 2021-06-25 MED ORDER — CHLORHEXIDINE GLUCONATE 0.12 % MT SOLN: 15.0000 mL | Freq: Once | OROMUCOSAL | Status: AC

## 2021-06-25 MED ORDER — DEXAMETHASONE SODIUM PHOSPHATE 4 MG/ML IJ SOLN: INTRAMUSCULAR | Status: DC | PRN

## 2021-06-25 MED ORDER — PANTOPRAZOLE SODIUM 40 MG PO TBEC
40.0000 mg | DELAYED_RELEASE_TABLET | Freq: Two times a day (BID) | ORAL | Status: DC
Start: 2021-06-25 — End: 2021-07-01
  Administered 2021-06-25 – 2021-07-01 (×13): 40 mg via ORAL
  Filled 2021-06-25 (×13): qty 1

## 2021-06-25 MED ORDER — MEPERIDINE HCL 25 MG/ML IJ SOLN: 6.2500 mg | INTRAMUSCULAR | Status: DC | PRN

## 2021-06-25 MED ORDER — MIDAZOLAM HCL 2 MG/2ML IJ SOLN
INTRAMUSCULAR | Status: AC
  Filled 2021-06-25: qty 2

## 2021-06-25 MED ORDER — CHLORHEXIDINE GLUCONATE 0.12 % MT SOLN
OROMUCOSAL | Status: AC
  Administered 2021-06-25: 15 mL via OROMUCOSAL
  Filled 2021-06-25: qty 15

## 2021-06-25 MED ORDER — ROPIVACAINE HCL 5 MG/ML IJ SOLN
INTRAMUSCULAR | Status: DC | PRN
  Administered 2021-06-25: 30 mL via PERINEURAL
  Administered 2021-06-25: 20 mL via PERINEURAL

## 2021-06-25 MED ORDER — ORAL CARE MOUTH RINSE: 15.0000 mL | Freq: Once | OROMUCOSAL | Status: AC

## 2021-06-25 MED ORDER — CLONIDINE HCL (ANALGESIA) 100 MCG/ML EP SOLN
EPIDURAL | Status: DC | PRN
  Administered 2021-06-25: 40 ug
  Administered 2021-06-25: 60 ug

## 2021-06-25 MED ORDER — MIDAZOLAM HCL 2 MG/2ML IJ SOLN
INTRAMUSCULAR | Status: AC
Start: 2021-06-25 — End: 2021-06-25
  Administered 2021-06-25: 1 mg via INTRAVENOUS
  Filled 2021-06-25: qty 2

## 2021-06-25 MED ORDER — FENTANYL CITRATE (PF) 100 MCG/2ML IJ SOLN: 50.0000 ug | Freq: Once | INTRAMUSCULAR | Status: AC

## 2021-06-25 MED ORDER — POTASSIUM CHLORIDE 10 MEQ/100ML IV SOLN
10.0000 meq | INTRAVENOUS | Status: AC
  Administered 2021-06-25 (×4): 10 meq via INTRAVENOUS
  Filled 2021-06-25 (×4): qty 100

## 2021-06-25 MED ORDER — LACTATED RINGERS IV SOLN: INTRAVENOUS | Status: DC | PRN

## 2021-06-25 MED ORDER — DROPERIDOL 2.5 MG/ML IJ SOLN: 0.6250 mg | Freq: Once | INTRAMUSCULAR | Status: DC | PRN

## 2021-06-25 MED ORDER — DEXAMETHASONE SODIUM PHOSPHATE 4 MG/ML IJ SOLN
INTRAMUSCULAR | Status: DC | PRN
  Administered 2021-06-25: 2 mg via PERINEURAL
  Administered 2021-06-25: 3 mg via PERINEURAL

## 2021-06-25 MED ORDER — ONDANSETRON HCL 4 MG/2ML IJ SOLN
INTRAMUSCULAR | Status: DC | PRN
  Administered 2021-06-25: 4 mg via INTRAVENOUS

## 2021-06-25 MED ORDER — FENTANYL CITRATE (PF) 100 MCG/2ML IJ SOLN: 25.0000 ug | INTRAMUSCULAR | Status: DC | PRN

## 2021-06-25 MED ORDER — ACETAMINOPHEN 500 MG PO TABS: 1000.0000 mg | ORAL_TABLET | Freq: Once | ORAL | Status: AC

## 2021-06-25 MED ORDER — MIDAZOLAM HCL 2 MG/2ML IJ SOLN: 0.5000 mg | Freq: Once | INTRAMUSCULAR | Status: AC

## 2021-06-25 SURGICAL SUPPLY — 54 items
BAG COUNTER SPONGE SURGICOUNT (BAG) ×2 IMPLANT
BAG SPNG CNTER NS LX DISP (BAG) ×1
BANDAGE ESMARK 6X9 LF (GAUZE/BANDAGES/DRESSINGS) IMPLANT
BLADE LONG MED 31X9 (MISCELLANEOUS) IMPLANT
BLADE SAW GIGLI 510 (BLADE) ×2 IMPLANT
BNDG CMPR 9X6 STRL LF SNTH (GAUZE/BANDAGES/DRESSINGS) ×1
BNDG CMPR MED 10X6 ELC LF (GAUZE/BANDAGES/DRESSINGS) ×1
BNDG COHESIVE 6X5 TAN STRL LF (GAUZE/BANDAGES/DRESSINGS) ×2 IMPLANT
BNDG ELASTIC 4X5.8 VLCR STR LF (GAUZE/BANDAGES/DRESSINGS) IMPLANT
BNDG ELASTIC 6X10 VLCR STRL LF (GAUZE/BANDAGES/DRESSINGS) ×1 IMPLANT
BNDG ELASTIC 6X5.8 VLCR STR LF (GAUZE/BANDAGES/DRESSINGS) IMPLANT
BNDG ESMARK 6X9 LF (GAUZE/BANDAGES/DRESSINGS) ×2
BNDG GAUZE ELAST 4 BULKY (GAUZE/BANDAGES/DRESSINGS) ×1 IMPLANT
CANISTER SUCT 3000ML PPV (MISCELLANEOUS) ×2 IMPLANT
CLIP LIGATING EXTRA MED SLVR (CLIP) ×2 IMPLANT
CLIP LIGATING EXTRA SM BLUE (MISCELLANEOUS) ×2 IMPLANT
COVER SURGICAL LIGHT HANDLE (MISCELLANEOUS) ×2 IMPLANT
CUFF TOURN SGL QUICK 34 (TOURNIQUET CUFF) ×2
CUFF TOURN SGL QUICK 42 (TOURNIQUET CUFF) IMPLANT
CUFF TRNQT CYL 34X4.125X (TOURNIQUET CUFF) IMPLANT
DRAIN CHANNEL 19F RND (DRAIN) IMPLANT
DRAPE HALF SHEET 40X57 (DRAPES) ×2 IMPLANT
DRAPE ORTHO SPLIT 77X108 STRL (DRAPES) ×4
DRAPE SURG ORHT 6 SPLT 77X108 (DRAPES) ×2 IMPLANT
DRSG ADAPTIC 3X8 NADH LF (GAUZE/BANDAGES/DRESSINGS) ×2 IMPLANT
ELECT REM PT RETURN 9FT ADLT (ELECTROSURGICAL) ×2
ELECTRODE REM PT RTRN 9FT ADLT (ELECTROSURGICAL) ×1 IMPLANT
EVACUATOR SILICONE 100CC (DRAIN) IMPLANT
GAUZE SPONGE 4X4 12PLY STRL (GAUZE/BANDAGES/DRESSINGS) ×2 IMPLANT
GAUZE SPONGE 4X4 12PLY STRL LF (GAUZE/BANDAGES/DRESSINGS) ×1 IMPLANT
GLOVE SURG ENC MOIS LTX SZ7.5 (GLOVE) ×2 IMPLANT
GOWN STRL REUS W/ TWL LRG LVL3 (GOWN DISPOSABLE) ×2 IMPLANT
GOWN STRL REUS W/ TWL XL LVL3 (GOWN DISPOSABLE) ×1 IMPLANT
GOWN STRL REUS W/TWL LRG LVL3 (GOWN DISPOSABLE) ×4
GOWN STRL REUS W/TWL XL LVL3 (GOWN DISPOSABLE) ×2
KIT BASIN OR (CUSTOM PROCEDURE TRAY) ×2 IMPLANT
KIT TURNOVER KIT B (KITS) ×2 IMPLANT
NS IRRIG 1000ML POUR BTL (IV SOLUTION) ×2 IMPLANT
PACK GENERAL/GYN (CUSTOM PROCEDURE TRAY) ×2 IMPLANT
PAD ARMBOARD 7.5X6 YLW CONV (MISCELLANEOUS) ×4 IMPLANT
STAPLER VISISTAT 35W (STAPLE) ×2 IMPLANT
STOCKINETTE IMPERVIOUS LG (DRAPES) ×2 IMPLANT
SUT BONE WAX W31G (SUTURE) IMPLANT
SUT ETHILON 3 0 PS 1 (SUTURE) IMPLANT
SUT SILK 0 TIES 10X30 (SUTURE) ×2 IMPLANT
SUT SILK 2 0 (SUTURE)
SUT SILK 2 0 SH CR/8 (SUTURE) ×3 IMPLANT
SUT SILK 2-0 18XBRD TIE 12 (SUTURE) ×1 IMPLANT
SUT SILK 3 0 (SUTURE)
SUT SILK 3-0 18XBRD TIE 12 (SUTURE) IMPLANT
SUT VIC AB 2-0 CT1 18 (SUTURE) ×5 IMPLANT
TOWEL GREEN STERILE (TOWEL DISPOSABLE) ×4 IMPLANT
UNDERPAD 30X36 HEAVY ABSORB (UNDERPADS AND DIAPERS) ×2 IMPLANT
WATER STERILE IRR 1000ML POUR (IV SOLUTION) ×2 IMPLANT

## 2021-06-25 NOTE — Progress Notes (Signed)
OT Cancellation Note ? ?Patient Details ?Name: Douglas [name] ?MRN: [id number] ?DOB: June 29, 1950 ? ? ?Cancelled Treatment:     Patient off floor to RLE amputation. OT will follow back as time permits. ? ?[name], OTR/L ?Acute Rehabilitation Services ?[phone] ?[phone]  ? ?[name] ?06/25/2021, 9:48 AM ?

## 2021-06-25 NOTE — Anesthesia Procedure Notes (Signed)
Procedure Name: [place] ?Date/Time: 06/25/2021 10:55 AM ?Performed by: Jenne Campus, CRNA ?Pre-anesthesia Checklist: Patient identified, Emergency Drugs available, Suction available and Patient being monitored ?Oxygen Delivery Method: Simple face mask ? ? ? ? ?

## 2021-06-25 NOTE — Anesthesia Procedure Notes (Signed)
Anesthesia Regional Block: Popliteal block  ? ?Pre-Anesthetic Checklist: , timeout performed,  Correct Patient, Correct Site, Correct Laterality,  Correct Procedure, Correct Position, site marked,  Risks and benefits discussed,  Surgical consent,  Pre-op evaluation,  At surgeon's request and post-op pain management ? ?Laterality: Lower and Right ? ?Prep: chloraprep     ?  ?Needles:  ?Injection technique: Single-shot ? ?Needle Type: Stimiplex   ? ? ?Needle Length: 10cm  ?Needle Gauge: 21  ? ? ? ?Additional Needles: ? ? ?Procedures:,,,, ultrasound used (permanent image in chart),,   ?Motor weakness within 5 minutes.  ?Narrative:  ?Start time: 06/25/2021 10:24 AM ?End time: 06/25/2021 10:35 AM ?Injection made incrementally with aspirations every 5 mL. ? ?Performed by: Personally  ?Anesthesiologist: Nolon Nations, MD ? ?Additional Notes: ?Nerve located and needle positioned with direct ultrasound guidance. Good perineural spread. Patient tolerated well. ? ? ? ? ?

## 2021-06-25 NOTE — Transfer of Care (Signed)
Immediate Anesthesia Transfer of Care Note ? ?Patient: Douglas [name] ? ?Procedure(s) Performed: RIGHT BELOW KNEE AMPUTATION (Right: Knee) ? ?Patient Location: PACU ? ?Anesthesia Type:MAC and Regional ? ?Level of Consciousness: oriented, sedated and patient cooperative ? ?Airway & Oxygen Therapy: Patient Spontanous Breathing and Patient connected to face mask oxygen ? ?Post-op Assessment: Report given to RN and Post -op Vital signs reviewed and stable ? ?Post vital signs: Reviewed ? ?Last Vitals:  ?Vitals Value Taken Time  ?BP 96/56 06/25/21 1157  ?Temp    ?Pulse 83 06/25/21 1159  ?Resp 11 06/25/21 1159  ?SpO2 99 % 06/25/21 1159  ?Vitals shown include unvalidated device data. ? ?Last Pain:  ?Vitals:  ? 06/25/21 0953  ?TempSrc: Oral  ?PainSc:   ?   ? ?Patients Stated Pain Goal: 2 (06/24/21 2006) ? ?Complications: No notable events documented. ?

## 2021-06-25 NOTE — Progress Notes (Signed)
ANTICOAGULATION CONSULT NOTE - Follow Up Consult ? ?Pharmacy Consult for heparin ?Indication:  RLE ischemia ? ?No Known Allergies ? ?Patient Measurements: ?Height: 6' (182.9 cm) ?Weight: 102.1 kg (225 lb) ?IBW/kg (Calculated) : 77.6 ?Heparin Dosing Weight: 98.5 ? ?Vital Signs: ?Temp: 98.1 ?F (36.7 ?C) (03/17 0423) ?Temp Source: Oral (03/17 0423) ?BP: 126/64 (03/17 0423) ?Pulse Rate: 86 (03/17 0423) ? ?Labs: ?Recent Labs  ?  06/22/21 ?1103 06/22/21 ?1230 06/23/21 ?0130 06/23/21 ?1013 06/24/21 ?0203 06/24/21 ?7619 06/24/21 ?1645 06/25/21 ?0533  ?HGB 15.3   < > 12.1*   < > 10.7* 10.4* 10.3* 10.0*  ?HCT 45.0   < > 35.6*   < > 32.6* 31.5* 31.4* 31.0*  ?PLT  --   --  400  --   --  370  --   --   ?HEPARINUNFRC  --   --  0.52   < > 0.39 0.36  --  0.27*  ?CREATININE 0.70  --  0.89  --   --   --   --  0.73  ? < > = values in this interval not displayed.  ? ? ? ?Estimated Creatinine Clearance: 103.2 mL/min (by C-G formula based on SCr of 0.73 mg/dL). ? ?Assessment: ?71 y.o. male with RLE ischemia s/p bypass and thrombectomy 3/14. No anticoagulation prior to admission. Pharmacy consulted for heparin, no bolus. ?  ?Heparin level returned below goal at 0.27 on 1900 units/hr. No further signs/symptoms of bleed noted per nurse. No issues with infusion. Will titrate rate slightly to achieve low end of goal.  ? ?Critical limb ischemia, vascular planning amputation today.  ? ?Goal of Therapy:  ?Heparin level 0.3-0.5 units/ml ?Monitor platelets by anticoagulation protocol: Yes ?  ?Plan:  ?Increase heparin infusion to 2000 units/hr.  ?Continue to target low end of goal due to recent bleeding ?Check anti-Xa level daily while on heparin. ?Continue to monitor H&H and platelets. ?Follow up after surgery ? ?Erin Hearing PharmD., BCPS ?Clinical Pharmacist ?06/25/2021 8:20 AM ? ?

## 2021-06-25 NOTE — Plan of Care (Signed)
?  Problem: Clinical Measurements: ?Goal: Will remain free from infection ?Outcome: Progressing ?Goal: Respiratory complications will improve ?Outcome: Progressing ?  ?Problem: Activity: ?Goal: Risk for activity intolerance will decrease ?Outcome: Progressing ?  ?Problem: Elimination: ?Goal: Will not experience complications related to bowel motility ?Outcome: Progressing ?  ?Problem: Pain Managment: ?Goal: General experience of comfort will improve ?Outcome: Progressing ?  ?

## 2021-06-25 NOTE — Anesthesia Postprocedure Evaluation (Signed)
Anesthesia Post Note ? ?Patient: Douglas [name] ? ?Procedure(s) Performed: RIGHT BELOW KNEE AMPUTATION (Right: Knee) ? ?  ? ?Patient location during evaluation: PACU ?Anesthesia Type: Regional ?Level of consciousness: awake and alert ?Pain management: pain level controlled ?Vital Signs Assessment: post-procedure vital signs reviewed and stable ?Respiratory status: spontaneous breathing, nonlabored ventilation, respiratory function stable and patient connected to nasal cannula oxygen ?Cardiovascular status: stable and blood pressure returned to baseline ?Postop Assessment: no apparent nausea or vomiting ?Anesthetic complications: no ? ? ?No notable events documented. ? ?Last Vitals:  ?Vitals:  ? 06/25/21 1227 06/25/21 1240  ?BP: (!) 127/57 130/63  ?Pulse: 79 81  ?Resp: 10 12  ?Temp: 36.8 ?C 36.7 ?C  ?SpO2: 91% 91%  ?  ?Last Pain:  ?Vitals:  ? 06/25/21 1240  ?TempSrc: Oral  ?PainSc:   ? ? ?  ?  ?  ?  ?  ?  ? ?Suzette Battiest E ? ? ? ? ?

## 2021-06-25 NOTE — Anesthesia Preprocedure Evaluation (Addendum)
Anesthesia Evaluation  ?Patient identified by MRN, date of birth, ID band ?Patient awake ? ? ? ?Reviewed: ?Allergy & Precautions, NPO status , Patient's Chart, lab work & pertinent test results ? ?Airway ?Mallampati: II ? ?TM Distance: >3 FB ?Neck ROM: Full ? ? ? Dental ?no notable dental hx. ?(+) Dental Advisory Given ?  ?Pulmonary ?neg pulmonary ROS, former smoker,  ?  ?Pulmonary exam normal ?breath sounds clear to auscultation ? ? ? ? ? ? Cardiovascular ?hypertension, Pt. on medications and Pt. on home beta blockers ?+ Past MI and + Peripheral Vascular Disease  ?Normal cardiovascular exam+ dysrhythmias + pacemaker  ?Rhythm:Regular Rate:Normal ? ?Pacer interrogation 05/03/2021 ?Total VP 97.8 % ?Total AP 14.3 % ?AS-VS 2.2 % ?AS-VP 84.4 % ?AP-VS < 0.1% ?AP-VP 13.4 % ? ?Echo 04/2021 ??1. Left ventricular ejection fraction, by estimation, is 50 to 55%. The  ?left ventricle has low normal function. The left ventricle demonstrates  ?regional wall motion abnormalities (see scoring diagram/findings for  ?description). Indeterminate diastolic  ?filling due to E-A fusion. There is moderate hypokinesis of the left  ?ventricular, basal-mid inferolateral wall.  ??2. Right ventricular systolic function is normal. The right ventricular  ?size is normal.  ??3. The mitral valve is normal in structure. No evidence of mitral valve  ?regurgitation.  ??4. The aortic valve is tricuspid. Aortic valve regurgitation is not  ?visualized. Mild aortic valve sclerosis is present, with no evidence of  ?aortic valve stenosis.  ?  ?Neuro/Psych ?negative neurological ROS ?   ? GI/Hepatic ?negative GI ROS, Neg liver ROS,   ?Endo/Other  ?diabetes ? Renal/GU ?negative Renal ROS  ? ?  ?Musculoskeletal ?negative musculoskeletal ROS ?(+)  ? Abdominal ?(+) + obese,   ?Peds ? Hematology ?negative hematology ROS ?(+)   ?Anesthesia Other Findings ? ? Reproductive/Obstetrics ? ?  ? ? ? ? ? ? ? ? ? ? ? ? ? ?  ?   ? ? ? ? ? ?Anesthesia Physical ? ?Anesthesia Plan ? ?ASA: 3 ? ?Anesthesia Plan: Regional  ? ?Post-op Pain Management: Regional block* and Tylenol PO (pre-op)*  ? ?Induction: Intravenous and Rapid sequence ? ?PONV Risk Score and Plan: 2 and Ondansetron, Dexamethasone, Treatment may vary due to age or medical condition and Propofol infusion ? ?Airway Management Planned: Natural Airway ? ?Additional Equipment:  ? ?Intra-op Plan:  ? ?Post-operative Plan:  ? ?Informed Consent: I have reviewed the patients History and Physical, chart, labs and discussed the procedure including the risks, benefits and alternatives for the proposed anesthesia with the patient or authorized representative who has indicated his/her understanding and acceptance.  ? ? ? ?Dental advisory given ? ?Plan Discussed with: CRNA ? ?Anesthesia Plan Comments: (Magnet available )  ? ? ? ? ?Anesthesia Quick Evaluation ? ?

## 2021-06-25 NOTE — Discharge Instructions (Signed)
[facility] [place]  Discharge instructions  Lower Extremity Amputation  Please refer to the following instruction for your post-procedure care. Your surgeon or physician assistant will discuss any changes with you.  Activity  You are encouraged to walk as much as you can. You can slowly return to normal activities during the month after your surgery. Avoid strenuous activity and heavy lifting until your doctor tells you it's OK. Avoid activities such as vacuuming or swinging a golf club. Do not drive until your doctor give the OK and you are no longer taking prescription pain medications. It is also normal to have difficulty with sleep habits, eating and bowel movement after surgery. These will go away with time.  Bathing/Showering  Shower daily after you go home. Do not soak in a bathtub, hot tub, or swim until the incision heals completely.  Incision Care  Clean your incision with mild soap and water. Shower every day. Pat the area dry with a clean towel. You do not need a bandage unless otherwise instructed. Do not apply any ointments or creams to your incision. If you have open wounds you will be instructed how to care for them or a visiting nurse may be arranged for you. If you have staples or sutures along your incision they will be removed at your post-op appointment. You may have skin glue on your incision. Do not peel it off. It will come off on its own in about one week.  Diet  Resume your normal diet. There are no special food restrictions following this procedure. A low fat/ low cholesterol diet is recommended for all patients with vascular disease. In order to heal from your surgery, it is CRITICAL to get adequate nutrition. Your body requires vitamins, minerals, and protein. Vegetables are the best source of vitamins and minerals. Vegetables also provide the perfect balance of protein. Processed food has little nutritional value, so try to avoid  this.  Medications  Resume taking all your medications unless your doctor or physician assistant tells you not to. If your incision is causing pain, you may take over-the-counter pain relievers such as acetaminophen (Tylenol). If you were prescribed a stronger pain medication, please aware these medication can cause nausea and constipation. Prevent nausea by taking the medication with a snack or meal. Avoid constipation by drinking plenty of fluids and eating foods with high amount of fiber, such as fruits, vegetables, and grains. Take Colace 100 mg (an over-the-counter stool softener) twice a day as needed for constipation.  Do not take Tylenol if you are taking prescription pain medications.  Follow Up  Our office will schedule a follow up appointment 4 weeks following discharge.  Please call us immediately for any of the following conditions  Increase pain, redness, warmth, or drainage (pus) from your incision site(s) Fever of 101 degree or higher The swelling in your leg with the amputation suddenly worsens and becomes more painful than when you were in the hospital  Leg swelling is common after amputation surgery.  The swelling should improve over a few months following surgery. To improve the swelling, you may elevate your legs above the level of your heart while you are sitting or resting. Your surgeon or physician assistant may ask you to apply an ACE wrap or wear compression (TED) stockings to help to reduce swelling.  Reduce your risk of vascular disease  Stop smoking. If you would like help call [facility] at 1-800-QUIT-NOW (1-800-784-8669) or [facility] at 336-586-4000.  Manage   your cholesterol Maintain a desired weight Control your diabetes weight Control your diabetes Keep your blood pressure down  If you have any questions, please call the office at [phone] 

## 2021-06-25 NOTE — Progress Notes (Signed)
?  Progress Note ? ? ? ?06/25/2021 ?10:22 AM ?Day of Surgery ? ?Subjective:  having similar right foot pain ? ?Vitals:  ? 06/25/21 0423 06/25/21 0953  ?BP: 126/64 (!) 140/52  ?Pulse: 86 94  ?Resp:  18  ?Temp: 98.1 ?F (36.7 ?C) 98.4 ?F (36.9 ?C)  ?SpO2: 91% 96%  ? ? ?Physical Exam: ?[place] ?Right foot is mottled ? ?CBC ?   ?Component Value Date/Time  ? WBC 10.1 06/24/2021 0832  ? RBC 3.51 (L) 06/24/2021 7062  ? HGB 10.0 (L) 06/25/2021 0533  ? HGB 14.9 05/04/2021 0752  ? HCT 31.0 (L) 06/25/2021 0533  ? PLT 370 06/24/2021 0832  ? PLT 352 05/04/2021 0752  ? MCV 89.7 06/24/2021 0832  ? MCH 29.6 06/24/2021 0832  ? MCHC 33.0 06/24/2021 0832  ? RDW 15.6 (H) 06/24/2021 3762  ? LYMPHSABS 2.2 06/18/2021 1044  ? MONOABS 0.5 06/18/2021 1044  ? EOSABS 0.1 06/18/2021 1044  ? BASOSABS 0.1 06/18/2021 1044  ? ? ?BMET ?   ?Component Value Date/Time  ? NA 131 (L) 06/25/2021 0533  ? K 3.3 (L) 06/25/2021 0533  ? CL 93 (L) 06/25/2021 0533  ? CO2 28 06/25/2021 0533  ? GLUCOSE 161 (H) 06/25/2021 0533  ? BUN 17 06/25/2021 0533  ? CREATININE 0.73 06/25/2021 0533  ? CREATININE 0.94 05/04/2021 0752  ? CALCIUM 7.6 (L) 06/25/2021 0533  ? GFRNONAA >60 06/25/2021 0533  ? GFRNONAA >60 05/04/2021 0752  ? GFRAA >60 01/08/2020 0810  ? ? ?INR ?   ?Component Value Date/Time  ? INR 1.0 06/18/2021 1044  ? ? ? ?Intake/Output Summary (Last 24 hours) at 06/25/2021 1022 ?Last data filed at 06/25/2021 0422 ?Gross per 24 hour  ?Intake --  ?Output 1450 ml  ?Net -1450 ml  ? ? ? ?Assessment:  71 y.o. male is s/p RLE bypass for mottled right foot without improvement. ? ?Plan: ?OR today for R bka ? ? ?Karmin Kasprzak C. Donzetta Matters, MD ?[facility] ?Office: [phone] ?Pager: [phone] ? ?06/25/2021 ?10:22 AM ? ?

## 2021-06-25 NOTE — Progress Notes (Addendum)
BK ?PROGRESS NOTE ? ?Douglas [name]  WHQ:[id number] DOB: Oct 09, 1950 DOA: 06/18/2021 ?PCP: [name], MD  ? ?Brief Narrative: ?Patient is a 71 year old male with history of BPH, CLL, hypertension who presented with right foot swelling, purple discoloration, pain, inability to ambulate.  On presentation he was found to have ischemic right lower extremity, started on heparin drip.  Vascular surgery consulted.  Found to have critical right lower extremity ischemia, status post right  lower extremity angiogram.  Vascular surgery did  bypass surgery with thrombectomy.  Hospital course also remarkable for coffee-ground emesis with a stable hemoglobin.  The limb turned out to be  unsalvageable so vascular surgery planning for right BKA today . ? ?Assessment & Plan: ? ?Principal Problem: ?  Critical limb ischemia of right lower extremity ([place]) ?Active Problems: ?  Coffee ground emesis ?  Physical debility ?  CLL (chronic lymphocytic leukemia) ([name]) ?  Hypertension ?  Diabetes mellitus ([name]) ?  BPH (benign prostatic hyperplasia) ?  Pacemaker ?  Constipation ?  Hypokalemia ? ? ?Assessment and Plan: ?* Critical limb ischemia of right lower extremity ([place]) ?-Patient with h/o plantar fasciitis, last received injection for this on 2/17 ?-Within the next 1-2 weeks, he developed foot swelling, pain, discoloration ?-Upon arrival in the podiatry office, there was concern for limb ischemia and he was sent to the ER ?-He was started on heparin infusion ?-ABIs showed severe right lower extremity arterial disease, moderate left lower extremity arterial disease ?-Found to have critical right lower extremity ischemia.  Status post angiogram.  Vascular surgery did bypass, thrombectomy on 3/14. ?-There is a concern from vascular surgery that his bypass procedure has failed,plan for right BKA today ? ?Coffee ground emesis ?Had significant coffee-ground emesis on 3/14.  There was about 800 cc of dark emesis collected in a can.  Interestingly,  patient does not have any symptoms, no abdominal pain, nausea or vomiting.  Hemoglobin has dropped from baseline but has remained stable now.  Started on Protonix drip.  Continue monitoring H&H.  We will check with vascular surgery about discontinuing heparin drip because he has been planned for right BKA. ? ?Physical debility ?PT/OT recommending CIR on discharge ? ?CLL (chronic lymphocytic leukemia) ([name]) ?-Diagnosed in 11/2018.   ?-Has done well with ibrutinib, and is continuing this medication. ?-Followed by Dr. [name]. ? ? ?Hypokalemia ?Supplemented and being monitored ? ?Constipation ?Continue bowel regimen ? ?Pacemaker ?-High grade heart block ?-Pacer in place and working well in January per [name] ?-[name] was performed in 01/2021 pre-procedure with no obvious obstructive CAD ? ?BPH (benign prostatic hyperplasia) ?-continue terazosin. Has intermittent issues of urinary retention ? ?Diabetes mellitus ([name]) ?-Last A1c was 6.7 ?-He is not on medications at home ?-Continue with sensitive-scale SSI ? ?Hypertension ?-Continue ARB, amlodipine, and terazosin ?-Continue prn IV hydralazine ? ? ? ? ? ?Nutrition Problem: Moderate Malnutrition ?Etiology: acute illness (R foot ischemia) ?  ? ?DVT prophylaxis:SCD's Start: 06/22/21 1712IV heparin ? ? ?  Code Status: Full Code ? ?Family Communication: Called and discussed with wife on phone on 3/16 ? ?Patient status:Inpatient ? ?Patient is from :Home ? ?Anticipated discharge to: Acute inpatient rehab ? ?Estimated DC date:Not sure yet ? ? ?Consultants: Vascular surgery ? ?Procedures:None  ? ?Antimicrobials:  ?Anti-infectives (From admission, onward)  ? ? Start     Dose/Rate Route Frequency Ordered Stop  ? 06/25/21 1046  ceFAZolin (ANCEF) 2-4 GM/100ML-% IVPB       ?Note to Pharmacy: [name] O: cabinet override  ?  06/25/21 1046 06/25/21 1117  ? 06/25/21 1030  ceFAZolin (ANCEF) IVPB 2g/100 mL premix       ? 2 g ?200 mL/hr over 30 Minutes Intravenous  Once 06/25/21 1043  06/25/21 1117  ? 06/22/21 1800  ceFAZolin (ANCEF) IVPB 2g/100 mL premix       ? 2 g ?200 mL/hr over 30 Minutes Intravenous Every 8 hours 06/22/21 1712 06/23/21 2040  ? ?  ? ? ?Subjective: ? ?Patient seen and examined at the bedside this morning.  Hemodynamically stable , appears comfortable.  He was waiting for planned BKA of his RLE  Very pleasant gentleman without any complaints ? ? ?Objective: ?Vitals:  ? 06/25/21 1025 06/25/21 1030 06/25/21 1035 06/25/21 1040  ?BP: (!) 146/55 (!) 140/51 (!) 73/54 130/64  ?Pulse: 90 87 87 88  ?Resp: '12 12 14 14  '$ ?Temp:      ?TempSrc:      ?SpO2: 96% 99% 98% 98%  ?Weight:      ?Height:      ? ? ?Intake/Output Summary (Last 24 hours) at 06/25/2021 1144 ?Last data filed at 06/25/2021 1102 ?Gross per 24 hour  ?Intake 100 ml  ?Output 1450 ml  ?Net -1350 ml  ? ?Filed Weights  ? 06/18/21 1200  ?Weight: 102.1 kg  ? ? ?Examination: ? ? ? ?General exam: Overall comfortable, not in distress ?HEENT: PERRL ?Respiratory system:  no wheezes or crackles  ?Cardiovascular system: S1 & S2 heard, RRR.  ?Gastrointestinal system: Abdomen is nondistended, soft and nontender. ?Central nervous system: Alert and oriented ?Extremities: Cold, mottled right foot with bluish discoloration, edema, surgical wounds ?Skin: No rashes, no ulcers,no icterus   ? ?Data Reviewed: I have personally reviewed following labs and imaging studies ? ?CBC: ?Recent Labs  ?Lab 06/21/21 ?0401 06/22/21 ?0023 06/22/21 ?0600 06/22/21 ?1103 06/23/21 ?0130 06/23/21 ?1013 06/23/21 ?1812 06/24/21 ?0203 06/24/21 ?7846 06/24/21 ?1645 06/25/21 ?0533  ?WBC 12.1* 14.8* 14.8*  --  11.9*  --   --   --  10.1  --   --   ?HGB 14.7 14.7 16.0   < > 12.1*   < > 11.5* 10.7* 10.4* 10.3* 10.0*  ?HCT 42.9 44.8 47.2   < > 35.6*   < > 35.8* 32.6* 31.5* 31.4* 31.0*  ?MCV 87.2 88.0 87.2  --  88.6  --   --   --  89.7  --   --   ?PLT 410* 449* 391  --  400  --   --   --  370  --   --   ? < > = values in this interval not displayed.  ? ?Basic Metabolic  Panel: ?Recent Labs  ?Lab 06/20/21 ?0020 06/21/21 ?0401 06/22/21 ?0023 06/22/21 ?1103 06/22/21 ?1230 06/23/21 ?0130 06/25/21 ?0533  ?NA 129* 131* 133* 134* 133* 135 131*  ?K 3.8 3.2* 3.4* 3.2* 3.4* 3.6 3.3*  ?CL 94* 94* 93* 90*  --  97* 93*  ?CO2 '23 26 29  '$ --   --  29 28  ?GLUCOSE 142* 141* 164* 183*  --  154* 161*  ?BUN '16 20 21 '$ 28*  --  25* 17  ?CREATININE 0.84 0.86 0.75 0.70  --  0.89 0.73  ?CALCIUM 8.7* 8.7* 8.8*  --   --  7.6* 7.6*  ? ? ? ?Recent Results (from the past 240 hour(s))  ?Resp Panel by RT-PCR (Flu A&B, Covid) Nasopharyngeal Swab     Status: None  ? Collection Time: 06/18/21 10:44 AM  ? Specimen: Nasopharyngeal Swab;  Nasopharyngeal(NP) swabs in vial transport medium  ?Result Value Ref Range Status  ? SARS Coronavirus 2 by RT PCR NEGATIVE NEGATIVE Final  ?  Comment: (NOTE) ?SARS-CoV-2 target nucleic acids are NOT DETECTED. ? ?The SARS-CoV-2 RNA is generally detectable in upper respiratory ?specimens during the acute phase of infection. The lowest ?concentration of SARS-CoV-2 viral copies this assay can detect is ?138 copies/mL. A negative result does not preclude SARS-Cov-2 ?infection and should not be used as the sole basis for treatment or ?other patient management decisions. A negative result may occur with  ?improper specimen collection/handling, submission of specimen other ?than nasopharyngeal swab, presence of viral mutation(s) within the ?areas targeted by this assay, and inadequate number of viral ?copies(<138 copies/mL). A negative result must be combined with ?clinical observations, patient history, and epidemiological ?information. The expected result is Negative. ? ?Fact Sheet for Patients:  ?EntrepreneurPulse.com.au ? ?Fact Sheet for Healthcare Providers:  ?IncredibleEmployment.be ? ?This test is no t yet approved or cleared by the [place] FDA and  ?has been authorized for detection and/or diagnosis of SARS-CoV-2 by ?FDA under an Emergency Use  Authorization (EUA). This EUA will remain  ?in effect (meaning this test can be used) for the duration of the ?COVID-19 declaration under Section 564(b)(1) of the Act, 21 ?U.S.C.section 360bbb-3(b)(1), unless th

## 2021-06-25 NOTE — Op Note (Signed)
? ? ?  Patient name: Douglas [name] MRN: [id number] DOB: 12/25/1950 Sex: male ? ?06/25/2021 ?Pre-operative Diagnosis: non viable right foot ?Post-operative diagnosis:  Same ?Surgeon:  Eda Paschal. Donzetta Matters, MD ?Assistant: Paulo Fruit, PA ?Procedure Performed:  Right below knee amputation ? ?Indications: 71 year old male with nonviable right lower extremity after right femoropopliteal bypass.  He now indicated for amputation. ? ?An experienced assistant was necessary to facilitate exposure of the bone and blood vessels for transection as well as reapproximating tissue. ? ?Findings: All tissue appeared healthy.  At completion the anterior posterior flaps were reapproximated without tension. ?  ?Procedure:  The patient was identified in the holding area and taken to the operating was placed supine operative table and MAC anesthesia was induced.  He was sterilely prepped and draped in the right lower extremity usual fashion, antibiotics were administered timeout was called.  A preoperative block of been placed and this was checked and noted to be intact.  A two thirds-one thirds incision was created this was somewhat of a longer amputation given his recent bypass.  We dissected down to the tibia.  This was transected with Gigli saw.  Blood vessels were clamped and divided.  Fibula was transected with bone cutter.  A posterior flap was created with amputation knife.  Vessels were suture-ligated.  The nerve was pulled on tension and divided ligated with a Vicryl tie.  Bone was smoothed with rasp.  The wound was thoroughly irrigated hemostasis was obtained.  I did debulk some of the muscle to allow reapproximation.  I then reapproximated the anterior posterior flaps with interrupted 0 Vicryl sutures.  Skin was closed with staples.  Sterile dressing was applied.  He tolerated procedure without any complication. ? ? ?EBL: 50cc ? ?Hallel Denherder C. Donzetta Matters, MD ?[facility] ?Office: [phone] ?Pager:  [phone] ? ? ?

## 2021-06-25 NOTE — Progress Notes (Signed)
Orthopedic Tech Progress Note ?Patient Details:  ?Douglas [name] ?Jan 04, 1951 ?[id number] ?Called in order to [facility] for Vive BK and retention sock ?Patient ID: Douglas [name], male   DOB: Jan 12, 1951, 71 y.o.   MRN: [id number] ? ?Pretty Weltman A Avanell Banwart ?06/25/2021, 2:01 PM ? ?

## 2021-06-25 NOTE — Progress Notes (Signed)
Patient off floor for procedure 

## 2021-06-25 NOTE — Assessment & Plan Note (Signed)
Supplemented and being monitored ?

## 2021-06-25 NOTE — Anesthesia Procedure Notes (Signed)
Anesthesia Regional Block: Adductor canal block  ? ?Pre-Anesthetic Checklist: , timeout performed,  Correct Patient, Correct Site, Correct Laterality,  Correct Procedure, Correct Position, site marked,  Risks and benefits discussed,  Surgical consent,  Pre-op evaluation,  At surgeon's request and post-op pain management ? ?Laterality: Lower and Right ? ?Prep: chloraprep     ?  ?Needles:  ?Injection technique: Single-shot ? ?Needle Type: Stimiplex   ? ? ?Needle Length: 9cm  ?Needle Gauge: 21  ? ? ? ?Additional Needles: ? ? ?Procedures:,,,, ultrasound used (permanent image in chart),,    ?Narrative:  ?Start time: 06/25/2021 10:10 AM ?End time: 06/25/2021 10:24 AM ?Injection made incrementally with aspirations every 5 mL. ? ?Performed by: Personally  ?Anesthesiologist: Nolon Nations, MD ? ?Additional Notes: ?BP cuff, EKG monitors applied. Sedation begun. Artery and nerve location verified with ultrasound. Anesthetic injected incrementally (30m), slowly, and after negative aspirations under direct u/s guidance. Good fascial/perineural spread. Tolerated well. ? ? ? ? ?

## 2021-06-25 NOTE — Progress Notes (Signed)
Pt transported to OR for planned procedure. Heparin drip stopped per PA verbal order this am.  ?

## 2021-06-26 ENCOUNTER — Encounter (HOSPITAL_COMMUNITY): Payer: Self-pay | Admitting: Vascular Surgery

## 2021-06-26 DIAGNOSIS — I70221 Atherosclerosis of native arteries of extremities with rest pain, right leg: Secondary | ICD-10-CM | POA: Diagnosis not present

## 2021-06-26 LAB — GLUCOSE, CAPILLARY
Glucose-Capillary: 130 mg/dL — ABNORMAL HIGH (ref 70–99)
Glucose-Capillary: 128 mg/dL — ABNORMAL HIGH (ref 70–99)
Glucose-Capillary: 161 mg/dL — ABNORMAL HIGH (ref 70–99)
Glucose-Capillary: 169 mg/dL — ABNORMAL HIGH (ref 70–99)

## 2021-06-26 LAB — CBC WITH DIFFERENTIAL/PLATELET
Abs Immature Granulocytes: 0.33 10*3/uL — ABNORMAL HIGH (ref 0.00–0.07)
Basophils Absolute: 0 10*3/uL (ref 0.0–0.1)
Basophils Relative: 0 %
Eosinophils Absolute: 0 10*3/uL (ref 0.0–0.5)
Eosinophils Relative: 0 %
HCT: 29.7 % — ABNORMAL LOW (ref 39.0–52.0)
Hemoglobin: 9.8 g/dL — ABNORMAL LOW (ref 13.0–17.0)
Immature Granulocytes: 3 %
Lymphocytes Relative: 8 %
Lymphs Abs: 1.1 10*3/uL (ref 0.7–4.0)
MCH: 29.6 pg (ref 26.0–34.0)
MCHC: 33 g/dL (ref 30.0–36.0)
MCV: 89.7 fL (ref 80.0–100.0)
Monocytes Absolute: 0.6 10*3/uL (ref 0.1–1.0)
Monocytes Relative: 5 %
Neutro Abs: 10.5 10*3/uL — ABNORMAL HIGH (ref 1.7–7.7)
Neutrophils Relative %: 84 %
Platelets: 475 10*3/uL — ABNORMAL HIGH (ref 150–400)
RBC: 3.31 MIL/uL — ABNORMAL LOW (ref 4.22–5.81)
RDW: 15.5 % (ref 11.5–15.5)
WBC: 12.5 10*3/uL — ABNORMAL HIGH (ref 4.0–10.5)
nRBC: 0 % (ref 0.0–0.2)

## 2021-06-26 LAB — BASIC METABOLIC PANEL
Anion gap: 8 (ref 5–15)
BUN: 14 mg/dL (ref 8–23)
CO2: 25 mmol/L (ref 22–32)
Calcium: 7.7 mg/dL — ABNORMAL LOW (ref 8.9–10.3)
Chloride: 97 mmol/L — ABNORMAL LOW (ref 98–111)
Creatinine, Ser: 0.71 mg/dL (ref 0.61–1.24)
GFR, Estimated: >60 mL/min (ref >60)
Glucose, Bld: 150 mg/dL — ABNORMAL HIGH (ref 70–99)
Potassium: 4.2 mmol/L (ref 3.5–5.1)
Sodium: 130 mmol/L — ABNORMAL LOW (ref 135–145)

## 2021-06-26 LAB — HEPARIN LEVEL (UNFRACTIONATED): Heparin Unfractionated: <0.1 IU/mL — ABNORMAL LOW (ref 0.30–0.70)

## 2021-06-26 MED ORDER — ACETAMINOPHEN 325 MG PO TABS
650.0000 mg | ORAL_TABLET | Freq: Four times a day (QID) | ORAL | Status: DC
Start: 2021-06-26 — End: 2021-07-01
  Administered 2021-06-26 – 2021-07-01 (×19): 650 mg via ORAL
  Filled 2021-06-26 (×16): qty 2

## 2021-06-26 MED ORDER — CHLORHEXIDINE GLUCONATE CLOTH 2 % EX PADS
6.0000 | MEDICATED_PAD | Freq: Every day | CUTANEOUS | Status: DC
  Administered 2021-06-26 – 2021-07-01 (×6): 6 via TOPICAL

## 2021-06-26 MED ORDER — HEPARIN SODIUM (PORCINE) 5000 UNIT/ML IJ SOLN
5000.0000 [IU] | Freq: Three times a day (TID) | INTRAMUSCULAR | Status: DC
  Administered 2021-06-26 – 2021-07-01 (×17): 5000 [IU] via SUBCUTANEOUS
  Filled 2021-06-26 (×15): qty 1

## 2021-06-26 MED ORDER — GABAPENTIN 300 MG PO CAPS
300.0000 mg | ORAL_CAPSULE | Freq: Two times a day (BID) | ORAL | Status: DC
  Administered 2021-06-26 – 2021-06-28 (×5): 300 mg via ORAL
  Filled 2021-06-26 (×5): qty 1

## 2021-06-26 MED ORDER — METHOCARBAMOL 500 MG PO TABS
500.0000 mg | ORAL_TABLET | Freq: Three times a day (TID) | ORAL | Status: DC
Start: 2021-06-26 — End: 2021-07-01
  Administered 2021-06-26 – 2021-07-01 (×16): 500 mg via ORAL
  Filled 2021-06-26 (×16): qty 1

## 2021-06-26 NOTE — Plan of Care (Signed)
  Problem: Education: Goal: Knowledge of General Education information will improve Description Including pain rating scale, medication(s)/side effects and non-pharmacologic comfort measures Outcome: Progressing   

## 2021-06-26 NOTE — Progress Notes (Addendum)
?  Progress Note ? ? ? ?06/26/2021 ?8:12 AM ?1 Day Post-Op ? ?Subjective:  says he had a rough night with pain. Burning, sharp shooting pains, feels foot pain still ? ? ?Vitals:  ? 06/26/21 0349 06/26/21 0810  ?BP: (!) 142/66 (!) 144/64  ?Pulse: 93   ?Resp: 18 17  ?Temp: 98.4 ?F (36.9 ?C) 98 ?F (36.7 ?C)  ?SpO2: 98% 94%  ? ?Physical Exam: ?Cardiac:  regular ?Lungs:  non labored ?Incisions:  right thigh incisions c/d/I without swelling or hematoma. RIght BKA dressed ?Extremities:  well perfused and warm ?Neurologic: alert and oriented ? ?CBC ?   ?Component Value Date/Time  ? WBC 12.5 (H) 06/26/2021 0240  ? RBC 3.31 (L) 06/26/2021 0240  ? HGB 9.8 (L) 06/26/2021 0240  ? HGB 14.9 05/04/2021 0752  ? HCT 29.7 (L) 06/26/2021 0240  ? PLT 475 (H) 06/26/2021 0240  ? PLT 352 05/04/2021 0752  ? MCV 89.7 06/26/2021 0240  ? MCH 29.6 06/26/2021 0240  ? MCHC 33.0 06/26/2021 0240  ? RDW 15.5 06/26/2021 0240  ? LYMPHSABS 1.1 06/26/2021 0240  ? MONOABS 0.6 06/26/2021 0240  ? EOSABS 0.0 06/26/2021 0240  ? BASOSABS 0.0 06/26/2021 0240  ? ? ?BMET ?   ?Component Value Date/Time  ? NA 130 (L) 06/26/2021 0240  ? K 4.2 06/26/2021 0240  ? CL 97 (L) 06/26/2021 0240  ? CO2 25 06/26/2021 0240  ? GLUCOSE 150 (H) 06/26/2021 0240  ? BUN 14 06/26/2021 0240  ? CREATININE 0.71 06/26/2021 0240  ? CREATININE 0.94 05/04/2021 0752  ? CALCIUM 7.7 (L) 06/26/2021 0240  ? GFRNONAA >60 06/26/2021 0240  ? GFRNONAA >60 05/04/2021 0752  ? GFRAA >60 01/08/2020 0810  ? ? ?INR ?   ?Component Value Date/Time  ? INR 1.0 06/18/2021 1044  ? ? ? ?Intake/Output Summary (Last 24 hours) at 06/26/2021 9774 ?Last data filed at 06/26/2021 0349 ?Gross per 24 hour  ?Intake 1277.96 ml  ?Output 1520 ml  ?Net -242.04 ml  ? ? ? ?Assessment/Plan:  71 y.o. male is s/p Right BKA 1 Day Post-Op RLE bypass 4 days Post-op ? ?Pain not well controlled- will add Gabapentin ?Hemodynamically stable ?Continue PT/OT/ mobilization ?Dressing change tomorrow to right BKA ? ? ?[name],  PA-C ?[facility] ?[phone] ?06/26/2021 ?8:12 AM ? ?VASCULAR STAFF ADDENDUM: ?I have independently interviewed and examined the patient. ?I agree with the above.  ? ?Yevonne Aline. Stanford Breed, MD ?[facility] [place] ?Office Phone Number: [phone] ?06/26/2021 11:15 AM ? ? ?

## 2021-06-26 NOTE — Evaluation (Signed)
Physical Therapy Re-Evaluation ?Patient Details ?Name: Douglas [name] ?MRN: [id number] ?DOB: 08-29-1950 ?Today's Date: 06/26/2021 ? ?History of Present Illness ? Patient is a 71 yo male admitted on 06/18/2021 with right foot swelling concerning for ischemic leg. Status post bypass and thrombectomy 06/22/21. 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 PMH: pacemaker, DM, HTN, and CLL. ?  ?Clinical Impression ? Pt now s/p R BKA. Pt presents with condition above and deficits mentioned below, see PT Problem List. PTA, pt lived with his wife in a 2-level house with 3 STE and was IND without DME. Currently, pt is experiencing a lot of pain s/p R BKA. However, despite the pain he was very motivated to participate in PT. He was able to transition supine > sit with HOB elevated and heavy use of bed rails with only minA today and scoot laterally on EOB with extra time and effort with min guard assist. He continues to display deficits in strength, balance, activity tolerance, and R distal residual limb sensation. As pt is very motivated to participate and improve and has had a drastic change in his functional status, PT recommending acute inpatient rehab at d/c to maximize his functional independence and safety. Will continue to follow acutely.   ?   ? ?Recommendations for follow up therapy are one component of a multi-disciplinary discharge planning process, led by the attending physician.  Recommendations may be updated based on patient status, additional functional criteria and insurance authorization. ? ?Follow Up Recommendations Acute inpatient rehab (3hours/day) ? ?  ?Assistance Recommended at Discharge Frequent or constant Supervision/Assistance  ?Patient can return home with the following ? Two people to help with walking and/or transfers;A lot of help with bathing/dressing/bathroom;Assistance with cooking/housework;Assist for transportation;Help with stairs or ramp for entrance ? ?  ?Equipment Recommendations Rolling walker (2  wheels);BSC/3in1;Wheelchair (measurements PT);Wheelchair cushion (measurements PT);Hospital bed (may change with progression)  ?Recommendations for Other Services ? Rehab consult  ?  ?Functional Status Assessment Patient has had a recent decline in their functional status and demonstrates the ability to make significant improvements in function in a reasonable and predictable amount of time.  ? ?  ?Precautions / Restrictions Precautions ?Precautions: Fall ?Precaution Comments: R BKA 3/17 ?Required Braces or Orthoses: Other Brace ?Other Brace: limb protector ?Restrictions ?Weight Bearing Restrictions: Yes ?RLE Weight Bearing: Non weight bearing  ? ?  ? ?Mobility ? Bed Mobility ?Overal bed mobility: Needs Assistance ?Bed Mobility: Supine to Sit, Sit to Supine ?  ?  ?Supine to sit: HOB elevated, Min assist ?Sit to supine: Min guard ?  ?General bed mobility comments: Extra time with reliance on bed rails to ascend trunk, needing x1 HHA minA to complete transition of trunk up to sit EOB. Min guard for safety with return to supine. ?  ? ?Transfers ?Overall transfer level: Needs assistance ?Equipment used: None ?  ?  ?  ?  ?  ?  ?  ?General transfer comment: Pt agreeable to scooting laterally EOB towards his R, needing cued to bring head anteriorly and push up and position UEs. Min guard for safety, extra time to complete >5 reps. Pt declined attempts for full sit to stand transfers or OOB to chair. ?  ? ?Ambulation/Gait ?  ?  ?  ?  ?  ?  ?  ?General Gait Details: deferred ? ?Stairs ?  ?  ?  ?  ?  ? ?Wheelchair Mobility ?  ? ?Modified Rankin (Stroke Patients Only) ?  ? ?  ? ?Balance Overall balance assessment: Needs  assistance ?Sitting-balance support: Feet supported, No upper extremity supported ?Sitting balance-Leahy Scale: Fair ?Sitting balance - Comments: Pt with posterior lean and difficulty maintaining midline at times, supervision-min guard to sit statically EOB. ?  ?  ?  ?Standing balance comment: deferred ?  ?  ?   ?  ?  ?  ?  ?  ?  ?  ?  ?   ? ? ? ?Pertinent Vitals/Pain Pain Assessment ?Pain Assessment: Faces ?Faces Pain Scale: Hurts even more ?Pain Location: R residual limb ?Pain Descriptors / Indicators: Sore, Discomfort, Guarding, Grimacing, Operative site guarding ?Pain Intervention(s): Limited activity within patient's tolerance, Monitored during session, Premedicated before session, Repositioned, Patient requesting pain meds-RN notified  ? ? ?Home Living Family/patient expects to be discharged to:: Private residence ?Living Arrangements: Spouse/significant other (Wife works from home) ?Available Help at Discharge: Family ?Type of Home: House ?Home Access: Stairs to enter ?Entrance Stairs-Rails: Left ?Entrance Stairs-Number of Steps: 3 ?Alternate Level Stairs-Number of Steps: 15 ?Home Layout: Two level ?Home Equipment: None ?Additional Comments: Was independent and still driving, retired  ?  ?Prior Function Prior Level of Function : Independent/Modified Independent ?  ?  ?  ?  ?  ?  ?Mobility Comments: Independent and driving ?ADLs Comments: Independent ?  ? ? ?Hand Dominance  ?   ? ?  ?Extremity/Trunk Assessment  ? Upper Extremity Assessment ?Upper Extremity Assessment: Defer to OT evaluation ?  ? ?Lower Extremity Assessment ?Lower Extremity Assessment: RLE deficits/detail ?RLE Deficits / Details: s/p R BKA 3/17, reports numbness distally lower residual limb but reports intact sensation from knee and superiorly; resting with knee flexed upon arrival, corrected limb protector and educated pt on proper limb positioning with him verbalizing understanding ?LLE Deficits / Details: Grossly 4 to 4+/5; denies numbness/tingling ?  ? ?Cervical / Trunk Assessment ?Cervical / Trunk Assessment: Normal  ?Communication  ? Communication: No difficulties  ?Cognition Arousal/Alertness: Awake/alert ?Behavior During Therapy: Anxious, WFL for tasks assessed/performed ?Overall Cognitive Status: Within Functional Limits for tasks assessed ?   ?  ?  ?  ?  ?  ?  ?  ?  ?  ?  ?  ?  ?  ?  ?  ?General Comments: Pt anxious in regards to pain with mobility, agreeable to bed level and scooting EOB but deferred further transfers. ?  ?  ? ?  ?General Comments General comments (skin integrity, edema, etc.): Educated pt on limb protector positioning, residual limb positioning in elevated and knee extended position, and performing quad sets throughout day ? ?  ?Exercises Amputee Exercises ?Knee Flexion: AROM, Right, Other reps (comment), Seated (x3 to pt tolerance) ?Knee Extension: AROM, Right, Other reps (comment), Seated (x3 to pt tolerance)  ? ?Assessment/Plan  ?  ?PT Assessment Patient needs continued PT services  ?PT Problem List Decreased strength;Decreased activity tolerance;Decreased balance;Decreased mobility;Impaired sensation;Pain;Decreased range of motion;Decreased knowledge of precautions;Decreased skin integrity ? ?   ?  ?PT Treatment Interventions Gait training;DME instruction;Therapeutic activities;Functional mobility training;Balance training;Therapeutic exercise;Patient/family education;Neuromuscular re-education;Wheelchair mobility training   ? ?PT Goals (Current goals can be found in the Care Plan section)  ?Acute Rehab PT Goals ?Patient Stated Goal: less pain ?PT Goal Formulation: With patient ?Time For Goal Achievement: 07/10/21 ?Potential to Achieve Goals: Good ? ?  ?Frequency Min 3X/week ?  ? ? ?Co-evaluation   ?  ?  ?  ?  ? ? ?  ?AM-PAC PT "6 Clicks" Mobility  ?Outcome Measure Help needed turning from your back to your  side while in a flat bed without using bedrails?: A Little ?Help needed moving from lying on your back to sitting on the side of a flat bed without using bedrails?: A Little ?Help needed moving to and from a bed to a chair (including a wheelchair)?: Total ?Help needed standing up from a chair using your arms (e.g., wheelchair or bedside chair)?: Total ?Help needed to walk in hospital room?: Total ?Help needed climbing 3-5 steps  with a railing? : Total ?6 Click Score: 10 ? ?  ?End of Session   ?Activity Tolerance: Patient limited by pain;Patient tolerated treatment well ?Patient left: in bed;with call bell/phone within reach;with bed alarm set ?N

## 2021-06-26 NOTE — Evaluation (Signed)
Occupational Therapy Re-Evaluation ?Patient Details ?Name: Douglas [name] ?MRN: [id number] ?DOB: [birth year] ?Today's Date: 06/26/2021 ? ? ?History of Present Illness Patient is a 71 yo male admitted on 06/18/2021 with right foot swelling concerning for ischemic leg. Status post bypass and thrombectomy 06/22/21. 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 PMH: pacemaker, DM, HTN, and CLL.  ? ?Clinical Impression ?  ?Pt admitted for concerns listed above. PTA pt reported that he was independent with all ADL's and IADL's. At this time, pt presents with increased pain due to recent R BKA. Pt with limited tolerance to EOB mobility at this time due to uncontrolled pain. As pt's pain is under control, pt will be a great candidate for AIR to maximize his independence s/p major functional decline. OT will continue to follow acutely.  ?   ? ?Recommendations for follow up therapy are one component of a multi-disciplinary discharge planning process, led by the attending physician.  Recommendations may be updated based on patient status, additional functional criteria and insurance authorization.  ? ?Follow Up Recommendations ? Acute inpatient rehab (3hours/day)  ?  ?Assistance Recommended at Discharge Frequent or constant Supervision/Assistance  ?Patient can return home with the following Two people to help with walking and/or transfers;A lot of help with bathing/dressing/bathroom;Assist for transportation;Help with stairs or ramp for entrance;Assistance with cooking/housework ? ?  ?Functional Status Assessment ? Patient has had a recent decline in their functional status and demonstrates the ability to make significant improvements in function in a reasonable and predictable amount of time.  ?Equipment Recommendations ? Other (comment) (TBD)  ?  ?Recommendations for Other Services Rehab consult ? ? ?  ?Precautions / Restrictions Precautions ?Precautions: Fall ?Precaution Comments: R BKA 3/17 ?Required Braces or Orthoses: Other Brace ?Other Brace: limb  protector ?Restrictions ?Weight Bearing Restrictions: Yes ?RLE Weight Bearing: Non weight bearing  ? ?  ? ?Mobility Bed Mobility ?Overal bed mobility: Needs Assistance ?Bed Mobility: Supine to Sit, Sit to Supine ?  ?  ?Supine to sit: HOB elevated, Min assist ?Sit to supine: Min guard ?  ?General bed mobility comments: Extra time with reliance on bed rails to ascend trunk, needing x1 HHA minA to complete transition of trunk up to sit EOB. Min guard for safety with return to supine. ?  ? ?Transfers ?Overall transfer level: Needs assistance ?Equipment used: None ?  ?  ?  ?  ?  ?  ?  ?General transfer comment: Pt agreeable to scooting laterally EOB towards his R, needing cued to bring head anteriorly and push up and position UEs. Min guard for safety, extra time to complete >5 reps. Pt declined attempts for full sit to stand transfers or OOB to chair. ?  ? ?  ?Balance Overall balance assessment: Needs assistance ?Sitting-balance support: Feet supported, No upper extremity supported ?Sitting balance-Leahy Scale: Fair ?Sitting balance - Comments: Needs support from at least 1UE ?  ?  ?  ?Standing balance comment: deferred ?  ?  ?  ?  ?  ?  ?  ?  ?  ?  ?  ?   ? ?ADL either performed or assessed with clinical judgement  ? ?ADL Overall ADL's : Needs assistance/impaired ?Eating/Feeding: Set up;Bed level ?  ?Grooming: Set up;Sitting ?  ?Upper Body Bathing: Set up;Sitting ?  ?Lower Body Bathing: Maximal assistance;Sitting/lateral leans ?  ?Upper Body Dressing : Set up;Sitting ?  ?Lower Body Dressing: Maximal assistance;Sitting/lateral leans ?  ?Toilet Transfer: Maximal assistance;+2 for physical assistance;+2 for safety/equipment;Cueing for safety;Cueing for sequencing ?  ?  Toileting- Clothing Manipulation and Hygiene: Maximal assistance;Sitting/lateral lean ?  ?  ?  ?  ?General ADL Comments: Patient presenting with decreased tolerance and endurance  ? ? ? ?Vision Baseline Vision/History: 1 Wears glasses ?Ability to See in  Adequate Light: 0 Adequate ?Patient Visual Report: No change from baseline ?Vision Assessment?: No apparent visual deficits  ?   ?Perception   ?  ?Praxis   ?  ? ?Pertinent Vitals/Pain Pain Assessment ?Pain Assessment: Faces ?Faces Pain Scale: Hurts even more ?Pain Location: R residual limb ?Pain Descriptors / Indicators: Sore, Discomfort, Guarding, Grimacing, Operative site guarding ?Pain Intervention(s): Limited activity within patient's tolerance, Monitored during session, Repositioned  ? ? ? ?Hand Dominance Right ?  ?Extremity/Trunk Assessment Upper Extremity Assessment ?Upper Extremity Assessment: Overall WFL for tasks assessed ?  ?Lower Extremity Assessment ?Lower Extremity Assessment: Defer to PT evaluation ?  ?Cervical / Trunk Assessment ?Cervical / Trunk Assessment: Normal ?  ?Communication Communication ?Communication: No difficulties ?  ?Cognition Arousal/Alertness: Awake/alert ?Behavior During Therapy: Anxious, WFL for tasks assessed/performed ?Overall Cognitive Status: Within Functional Limits for tasks assessed ?  ?  ?  ?  ?  ?  ?  ?  ?  ?  ?  ?  ?  ?  ?  ?  ?General Comments: Pt anxious in regards to pain with mobility, agreeable to bed level and scooting EOB but deferred further transfers. ?  ?  ?General Comments  Educated on importance of sitting up and working on scooting, as well as residual limb positioning to prevent contractures. ? ?  ?Exercises   ?  ?Shoulder Instructions    ? ? ?Home Living Family/patient expects to be discharged to:: Private residence ?Living Arrangements: Spouse/significant other (Wife works from home) ?Available Help at Discharge: Family ?Type of Home: House ?Home Access: Stairs to enter ?Entrance Stairs-Number of Steps: 3 ?Entrance Stairs-Rails: Left ?Home Layout: Two level ?Alternate Level Stairs-Number of Steps: 15 ?  ?Bathroom Shower/Tub: Gaffer (Built in seat) ?  ?Bathroom Toilet: Standard ?  ?  ?Home Equipment: None ?  ?Additional Comments: Was independent and  still driving, retired ?  ? ?  ?Prior Functioning/Environment Prior Level of Function : Independent/Modified Independent ?  ?  ?  ?  ?  ?  ?Mobility Comments: Independent and driving ?ADLs Comments: Independent ?  ? ?  ?  ?OT Problem List: Decreased strength;Decreased range of motion;Decreased activity tolerance;Impaired balance (sitting and/or standing);Decreased knowledge of use of DME or AE;Decreased knowledge of precautions;Pain ?  ?   ?OT Treatment/Interventions: Self-care/ADL training;Therapeutic exercise;Energy conservation;DME and/or AE instruction;Manual therapy;Therapeutic activities;Patient/family education;Balance training  ?  ?OT Goals(Current goals can be found in the care plan section) Acute Rehab OT Goals ?Patient Stated Goal: To go home ?OT Goal Formulation: With patient ?Time For Goal Achievement: 07/07/21 ?Potential to Achieve Goals: Fair  ?OT Frequency: Min 2X/week ?  ? ?Co-evaluation   ?  ?  ?  ?  ? ?  ?AM-PAC OT "6 Clicks" Daily Activity     ?Outcome Measure Help from another person eating meals?: A Little ?Help from another person taking care of personal grooming?: A Little ?Help from another person toileting, which includes using toliet, bedpan, or urinal?: A Lot ?Help from another person bathing (including washing, rinsing, drying)?: A Lot ?Help from another person to put on and taking off regular upper body clothing?: A Little ?Help from another person to put on and taking off regular lower body clothing?: A Lot ?6 Click Score: 15 ?  ?  End of Session Nurse Communication: Mobility status;Need for lift equipment ? ?Activity Tolerance: Patient limited by pain ?Patient left: in bed;with call bell/phone within reach;with bed alarm set ? ?OT Visit Diagnosis: Unsteadiness on feet (R26.81);Other abnormalities of gait and mobility (R26.89);Muscle weakness (generalized) (M62.81);Pain ?Pain - Right/Left: Right ?Pain - part of body: Leg  ?              ?Time: 3818-2993 ?OT Time Calculation (min): 21  min ?Charges:  OT General Charges ?$OT Visit: 1 Visit ?OT Evaluation ?$OT Re-eval: 1 Re-eval ? ?Dawson Albers H., OTR/L ?Acute Rehabilitation ? ?Karess Harner Elane Yolanda Bonine ?06/26/2021, 5:38 PM ?

## 2021-06-26 NOTE — Progress Notes (Signed)
?PROGRESS NOTE ? ? ? ?Douglas [name]  OEV:[id number] DOB: 03-20-1951 DOA: 06/18/2021 ?PCP: [name], MD  ? ? ?Brief Narrative:  ?Patient is a 71 year old male with history of BPH, CLL, hypertension who presented with right foot swelling, purple discoloration, pain, inability to ambulate.  On presentation he was found to have ischemic right lower extremity, started on heparin drip.  Vascular surgery consulted.  Found to have critical right lower extremity ischemia, status post right  lower extremity angiogram.  Vascular surgery did  bypass surgery with thrombectomy.  Hospital course also remarkable for coffee-ground emesis with a stable hemoglobin.  The limb turned out to be  unsalvageable so vascular surgery planning for right BKA  ?  ? ? ? ?Consultants:  ?Vascular surgery ? ?Procedures:  ? ?Antimicrobials:  ?  ? ? ?Subjective: ?C/o leg pain, asked for pain med from nsg,otherwise no other complaints. Unable to eat this am b/c too bothered by the pain ? ?Objective: ?Vitals:  ? 06/25/21 2321 06/26/21 0349 06/26/21 0810 06/26/21 1200  ?BP: 125/66 (!) 142/66 (!) 144/64 (!) 138/58  ?Pulse: 84 93  95  ?Resp: '16 18 17 18  '$ ?Temp: 98.3 ?F (36.8 ?C) 98.4 ?F (36.9 ?C) 98 ?F (36.7 ?C) 98.2 ?F (36.8 ?C)  ?TempSrc: Oral Oral Oral Oral  ?SpO2: 98% 98% 94% 94%  ?Weight:      ?Height:      ? ? ?Intake/Output Summary (Last 24 hours) at 06/26/2021 1207 ?Last data filed at 06/26/2021 1203 ?Gross per 24 hour  ?Intake 577.96 ml  ?Output 2075 ml  ?Net -1497.04 ml  ? ?Filed Weights  ? 06/18/21 1200  ?Weight: 102.1 kg  ? ? ?Examination: ?Calm, NAD ?Cta no w/r ?Reg s1/s2 no gallop ?Soft benign +bs ?No edema, Rt BKA. ?Aaoxox3  ?Mood and affect appropriate in current setting  ? ? ? ?Data Reviewed: I have personally reviewed following labs and imaging studies ? ?CBC: ?Recent Labs  ?Lab 06/22/21 ?0023 06/22/21 ?0600 06/22/21 ?1103 06/23/21 ?0130 06/23/21 ?1013 06/24/21 ?0203 06/24/21 ?8299 06/24/21 ?1645 06/25/21 ?3716 06/26/21 ?0240  ?WBC  14.8* 14.8*  --  11.9*  --   --  10.1  --   --  12.5*  ?NEUTROABS  --   --   --   --   --   --   --   --   --  10.5*  ?HGB 14.7 16.0   < > 12.1*   < > 10.7* 10.4* 10.3* 10.0* 9.8*  ?HCT 44.8 47.2   < > 35.6*   < > 32.6* 31.5* 31.4* 31.0* 29.7*  ?MCV 88.0 87.2  --  88.6  --   --  89.7  --   --  89.7  ?PLT 449* 391  --  400  --   --  370  --   --  475*  ? < > = values in this interval not displayed.  ? ?Basic Metabolic Panel: ?Recent Labs  ?Lab 06/21/21 ?0401 06/22/21 ?0023 06/22/21 ?1103 06/22/21 ?1230 06/23/21 ?0130 06/25/21 ?9678 06/26/21 ?0240  ?NA 131* 133* 134* 133* 135 131* 130*  ?K 3.2* 3.4* 3.2* 3.4* 3.6 3.3* 4.2  ?CL 94* 93* 90*  --  97* 93* 97*  ?CO2 26 29  --   --  '29 28 25  '$ ?GLUCOSE 141* 164* 183*  --  154* 161* 150*  ?BUN 20 21 28*  --  25* 17 14  ?CREATININE 0.86 0.75 0.70  --  0.89 0.73 0.71  ?CALCIUM 8.7* 8.8*  --   --  7.6* 7.6* 7.7*  ? ?GFR: ?Estimated Creatinine Clearance: 103.2 mL/min (by C-G formula based on SCr of 0.71 mg/dL). ?Liver Function Tests: ?No results for input(s): AST, ALT, ALKPHOS, BILITOT, PROT, ALBUMIN in the last 168 hours. ?No results for input(s): LIPASE, AMYLASE in the last 168 hours. ?No results for input(s): AMMONIA in the last 168 hours. ?Coagulation Profile: ?No results for input(s): INR, PROTIME in the last 168 hours. ?Cardiac Enzymes: ?No results for input(s): CKTOTAL, CKMB, CKMBINDEX, TROPONINI in the last 168 hours. ?BNP (last 3 results) ?No results for input(s): PROBNP in the last 8760 hours. ?HbA1C: ?No results for input(s): HGBA1C in the last 72 hours. ?CBG: ?Recent Labs  ?Lab 06/25/21 ?1300 06/25/21 ?1709 06/25/21 ?2113 06/26/21 ?0604 06/26/21 ?1159  ?GLUCAP 172* 264* 177* 130* 128*  ? ?Lipid Profile: ?No results for input(s): CHOL, HDL, LDLCALC, TRIG, CHOLHDL, LDLDIRECT in the last 72 hours. ?Thyroid Function Tests: ?No results for input(s): TSH, T4TOTAL, FREET4, T3FREE, THYROIDAB in the last 72 hours. ?Anemia Panel: ?No results for input(s): VITAMINB12, FOLATE,  FERRITIN, TIBC, IRON, RETICCTPCT in the last 72 hours. ?Sepsis Labs: ?No results for input(s): PROCALCITON, LATICACIDVEN in the last 168 hours. ? ?Recent Results (from the past 240 hour(s))  ?Resp Panel by RT-PCR (Flu A&B, Covid) Nasopharyngeal Swab     Status: None  ? Collection Time: 06/18/21 10:44 AM  ? Specimen: Nasopharyngeal Swab; Nasopharyngeal(NP) swabs in vial transport medium  ?Result Value Ref Range Status  ? SARS Coronavirus 2 by RT PCR NEGATIVE NEGATIVE Final  ?  Comment: (NOTE) ?SARS-CoV-2 target nucleic acids are NOT DETECTED. ? ?The SARS-CoV-2 RNA is generally detectable in upper respiratory ?specimens during the acute phase of infection. The lowest ?concentration of SARS-CoV-2 viral copies this assay can detect is ?138 copies/mL. A negative result does not preclude SARS-Cov-2 ?infection and should not be used as the sole basis for treatment or ?other patient management decisions. A negative result may occur with  ?improper specimen collection/handling, submission of specimen other ?than nasopharyngeal swab, presence of viral mutation(s) within the ?areas targeted by this assay, and inadequate number of viral ?copies(<138 copies/mL). A negative result must be combined with ?clinical observations, patient history, and epidemiological ?information. The expected result is Negative. ? ?Fact Sheet for Patients:  ?EntrepreneurPulse.com.au ? ?Fact Sheet for Healthcare Providers:  ?IncredibleEmployment.be ? ?This test is no t yet approved or cleared by the [place] FDA and  ?has been authorized for detection and/or diagnosis of SARS-CoV-2 by ?FDA under an Emergency Use Authorization (EUA). This EUA will remain  ?in effect (meaning this test can be used) for the duration of the ?COVID-19 declaration under Section 564(b)(1) of the Act, 21 ?U.S.C.section 360bbb-3(b)(1), unless the authorization is terminated  ?or revoked sooner.  ? ? ?  ? Influenza A by PCR NEGATIVE  NEGATIVE Final  ? Influenza B by PCR NEGATIVE NEGATIVE Final  ?  Comment: (NOTE) ?The Xpert Xpress SARS-CoV-2/FLU/RSV plus assay is intended as an aid ?in the diagnosis of influenza from Nasopharyngeal swab specimens and ?should not be used as a sole basis for treatment. Nasal washings and ?aspirates are unacceptable for Xpert Xpress SARS-CoV-2/FLU/RSV ?testing. ? ?Fact Sheet for Patients: ?EntrepreneurPulse.com.au ? ?Fact Sheet for Healthcare Providers: ?IncredibleEmployment.be ? ?This test is not yet approved or cleared by the [place] FDA and ?has been authorized for detection and/or diagnosis of SARS-CoV-2 by ?FDA under an Emergency Use Authorization (EUA). This EUA will remain ?in effect (meaning this test can be used) for the duration of the ?  COVID-19 declaration under Section 564(b)(1) of the Act, 21 U.S.C. ?section 360bbb-3(b)(1), unless the authorization is terminated or ?revoked. ? ?Performed at [facility], [place] [address], [place], [place] ?[address] ?  ?Surgical pcr screen     Status: Abnormal  ? Collection Time: 06/21/21  8:35 PM  ? Specimen: Nasal Mucosa; Nasal Swab  ?Result Value Ref Range Status  ? MRSA, PCR NEGATIVE NEGATIVE Final  ? Staphylococcus aureus POSITIVE (A) NEGATIVE Final  ?  Comment: (NOTE) ?The Xpert SA Assay (FDA approved for NASAL specimens in patients 66 ?years of age and older), is one component of a comprehensive ?surveillance program. It is not intended to diagnose infection nor to ?guide or monitor treatment. ?Performed at [facility], [place] [address], [place], [place] ?[address] ?  ?  ? ? ? ? ? ?Radiology Studies: ?No results found. ? ? ? ? ? ?Scheduled Meds: ? acetaminophen  650 mg Oral Q6H  ? amLODipine  5 mg Oral Daily  ? aspirin EC  81 mg Oral Daily  ? docusate sodium  100 mg Oral BID  ? gabapentin  300 mg Oral BID  ? heparin injection (subcutaneous)  5,000 Units Subcutaneous Q8H  ? hydrochlorothiazide  25 mg Oral Daily  ?  insulin aspart  0-5 Units Subcutaneous QHS  ? insulin aspart  0-9 Units Subcutaneous TID WC  ? irbesartan  300 mg Oral Daily  ? methocarbamol  500 mg Oral TID  ? multivitamin with minerals  1 tablet Oral Daily

## 2021-06-27 DIAGNOSIS — I70221 Atherosclerosis of native arteries of extremities with rest pain, right leg: Secondary | ICD-10-CM | POA: Diagnosis not present

## 2021-06-27 LAB — GLUCOSE, CAPILLARY
Glucose-Capillary: 191 mg/dL — ABNORMAL HIGH (ref 70–99)
Glucose-Capillary: 143 mg/dL — ABNORMAL HIGH (ref 70–99)
Glucose-Capillary: 140 mg/dL — ABNORMAL HIGH (ref 70–99)
Glucose-Capillary: 179 mg/dL — ABNORMAL HIGH (ref 70–99)

## 2021-06-27 LAB — SODIUM: Sodium: 132 mmol/L — ABNORMAL LOW (ref 135–145)

## 2021-06-27 LAB — POTASSIUM: Potassium: 4.2 mmol/L (ref 3.5–5.1)

## 2021-06-27 LAB — MAGNESIUM: Magnesium: 2.1 mg/dL (ref 1.7–2.4)

## 2021-06-27 MED ORDER — POLYETHYLENE GLYCOL 3350 17 G PO PACK
17.0000 g | PACK | Freq: Two times a day (BID) | ORAL | Status: DC
  Administered 2021-06-27 – 2021-07-01 (×7): 17 g via ORAL
  Filled 2021-06-27 (×7): qty 1

## 2021-06-27 MED ORDER — FLEET ENEMA 7-19 GM/118ML RE ENEM: 1.0000 | ENEMA | Freq: Every day | RECTAL | Status: DC | PRN

## 2021-06-27 NOTE — TOC Initial Note (Signed)
Transition of Care (TOC) - Initial/Assessment Note  ? ? ?Patient Details  ?Name: Douglas [name] ?MRN: [id number] ?Date of Birth: 1950/11/19 ? ?Transition of Care (TOC) CM/SW Contact:    ?Carles Collet, RN ?Phone Number: ?06/27/2021, 11:08 AM ? ?Clinical Narrative:        Spoke w patient at bedside. ?He states that he is from home w wife(relies on cane). Home is traditional 2 story with all bedrooms and bathrooms upstairs.  ?He states that they are planning to move into an apartment prior to him coming home (he was under the impression that CIR was guaranteed and he would be there 3-4 weeks giving him plenty of time to set up a new residence). ?Discussed that several steps need to be taken before he can go to CIR such as being accepted, insurance auth and bed availability, and that admission to CIR is not guaranteed.  ?Discussed alternative DC plan to SNF if CIR is not an option. ?Discussed that a stay at either is typically 10-20 days. ?We discussed that transitioning to an apartment prior to him being released from post acute care (CIR/ SNF) may be ambitious and that he could have a hospital bed at home on the first floor and a BSC as a temporary modification to his current living situation.     ?TOC will continue to follow.       ? ? ?Expected Discharge Plan:  (TBD) ?Barriers to Discharge: Continued Medical Work up ? ? ?Patient Goals and CMS Choice ?Patient states their goals for this hospitalization and ongoing recovery are:: wants inpatient rehab ?  ?  ? ?Expected Discharge Plan and Services ?Expected Discharge Plan:  (TBD) ?In-house Referral: Clinical Social Work ?Discharge Planning Services: CM Consult ?  ?Living arrangements for the past 2 months: [place] ?                ?  ?  ?  ?  ?  ?  ?  ?  ?  ?  ? ?Prior Living Arrangements/Services ?Living arrangements for the past 2 months: [place] ?Lives with:: Spouse ?  ?       ?  ?  ?  ?  ? ?Activities of Daily Living ?Home Assistive  Devices/Equipment: None ?ADL Screening (condition at time of admission) ?Patient's cognitive ability adequate to safely complete daily activities?: Yes ?Is the patient deaf or have difficulty hearing?: Yes ?Does the patient have difficulty seeing, even when wearing glasses/contacts?: Yes ?Does the patient have difficulty concentrating, remembering, or making decisions?: No ?Patient able to express need for assistance with ADLs?: No ?Does the patient have difficulty dressing or bathing?: No ?Independently performs ADLs?: Yes (appropriate for developmental age) ?Does the patient have difficulty walking or climbing stairs?: Yes ?Weakness of Legs: None ?Weakness of Arms/Hands: None ? ?Permission Sought/Granted ?  ?  ?   ?   ?   ?   ? ?Emotional Assessment ?  ?  ?  ?  ?  ?  ? ?Admission diagnosis:  Critical limb ischemia of right lower extremity ([place]) [I70.221] ?Patient Active Problem List  ? Diagnosis Date Noted  ? Hypokalemia 06/25/2021  ? Constipation 06/24/2021  ? Physical debility 06/23/2021  ? Coffee ground emesis 06/22/2021  ? Critical limb ischemia of right lower extremity ([place]) 06/18/2021  ? Pacemaker 04/30/2021  ? Elevated troponin   ? Mobitz type 1 second degree AV block   ? CLL (chronic lymphocytic leukemia) ([place]) 01/16/2021  ? NSTEMI (non-ST elevated  myocardial infarction) ([place]) 01/16/2021  ? Hypertension 01/16/2021  ? Diabetes mellitus ([place]) 01/16/2021  ? BPH (benign prostatic hyperplasia) 01/16/2021  ? ?PCP:  [name], MD ?Pharmacy:   ?[facility] [place] [facility] [place] ?[place] ?[place] [address] ?Phone: [phone] Fax: [phone] ? ?[facility] ([place], [place] ?[place] ?[address] ?[place] [address] ?Phone: [phone] Fax: [phone] ? ?[facility] [place], [place] ?[address] ?[place]  [address] ?Phone: [phone] Fax: [phone] ? ? ? ? ?Social Determinants of Health (SDOH) Interventions ?  ? ?Readmission Risk Interventions ?No flowsheet data found. ? ? ?

## 2021-06-27 NOTE — Progress Notes (Signed)
06/27/2021 ?0145 ?Pt with 17 bt run of Vtach.  VSS-asymptomatic-MD notified-Orders received for stat mag & K.   ?[facility] Aliment C ? ?

## 2021-06-27 NOTE — Progress Notes (Addendum)
?  Progress Note ? ? ? ?06/27/2021 ?8:59 AM ?2 Days Post-Op ? ?Subjective:  says pain better controlled. Still some shooting pain and burning in R BKA  ? ? ?Vitals:  ? 06/27/21 0400 06/27/21 0734  ?BP: (!) 161/71 (!) 152/81  ?Pulse: 97 93  ?Resp: 20 20  ?Temp: 98.1 ?F (36.7 ?C) 98.4 ?F (36.9 ?C)  ?SpO2: 95% 95%  ? ?Physical Exam: ?Cardiac:  regular ?Lungs:  non labored ?Incisions:  Right BKA incision intact. Mild bloody drainage from mid incision. Otherwise viable flaps. Very well appearing ? ? ?Extremities:  RLE well perfused and warm. Right thigh incisions are intact and healing. Right groin with ecchymosis ?Abdomen:  obese, soft ?Neurologic: alert and oriented ? ?CBC ?   ?Component Value Date/Time  ? WBC 12.5 (H) 06/26/2021 0240  ? RBC 3.31 (L) 06/26/2021 0240  ? HGB 9.8 (L) 06/26/2021 0240  ? HGB 14.9 05/04/2021 0752  ? HCT 29.7 (L) 06/26/2021 0240  ? PLT 475 (H) 06/26/2021 0240  ? PLT 352 05/04/2021 0752  ? MCV 89.7 06/26/2021 0240  ? MCH 29.6 06/26/2021 0240  ? MCHC 33.0 06/26/2021 0240  ? RDW 15.5 06/26/2021 0240  ? LYMPHSABS 1.1 06/26/2021 0240  ? MONOABS 0.6 06/26/2021 0240  ? EOSABS 0.0 06/26/2021 0240  ? BASOSABS 0.0 06/26/2021 0240  ? ? ?BMET ?   ?Component Value Date/Time  ? NA 132 (L) 06/27/2021 0107  ? K 4.2 06/27/2021 0241  ? CL 97 (L) 06/26/2021 0240  ? CO2 25 06/26/2021 0240  ? GLUCOSE 150 (H) 06/26/2021 0240  ? BUN 14 06/26/2021 0240  ? CREATININE 0.71 06/26/2021 0240  ? CREATININE 0.94 05/04/2021 0752  ? CALCIUM 7.7 (L) 06/26/2021 0240  ? GFRNONAA >60 06/26/2021 0240  ? GFRNONAA >60 05/04/2021 0752  ? GFRAA >60 01/08/2020 0810  ? ? ?INR ?   ?Component Value Date/Time  ? INR 1.0 06/18/2021 1044  ? ? ? ?Intake/Output Summary (Last 24 hours) at 06/27/2021 0859 ?Last data filed at 06/27/2021 0856 ?Gross per 24 hour  ?Intake 3 ml  ?Output 5075 ml  ?Net -5072 ml  ? ? ? ?Assessment/Plan:  71 y.o. male is s/p R BKA 2 Days Post-Op  ? ?Pain better controlled ?Right BKA with viable flaps ?Dry dressings and ACE  applied. Stump sock and [place] placed back on ?RN noted run VT- asymptomatic ?Hemodynamically stable ?PT/OT recommending CIR ?Continue therapies ?Will arranged follow up in 1 month for staple removal  ? ?[name], PA-C ?[facility] ?[phone] ?06/27/2021 ?8:59 AM ? ?VASCULAR STAFF ADDENDUM: ?I have independently interviewed and examined the patient. ?I agree with the above.  ? ?Yevonne Aline. Stanford Breed, MD ?[facility] [place] ?Office Phone Number: [phone] ?06/27/2021 10:59 AM ? ? ?

## 2021-06-27 NOTE — Progress Notes (Addendum)
Inpatient Rehab Admissions Coordinator:  ? ?Per therapy recommendations, patient was screened for CIR candidacy by Clemens Catholic, MS, CCC-SLP ?[name] At this time, Pt. is not yet tolerating OOB since his BKA. It is not yet clear that he would be able to tolerate the intensity of CIR; however,  Pt. may have potential to progress to becoming a potential CIR candidate, so CIR admissions team will follow and monitor for progress and participation with therapies and place consult order if Pt. appears to be an appropriate candidate. Please contact me with any questions ? ?Clemens Catholic, MS, CCC-SLP ?Rehab Admissions Coordinator  ?[phone] (celll) ?[phone] (office) ? ?

## 2021-06-27 NOTE — Progress Notes (Signed)
?PROGRESS NOTE ? ? ? ?Douglas [name]  NUU:[id number] DOB: 06/15/1950 DOA: 06/18/2021 ?PCP: [name], MD  ? ? ?Brief Narrative:  ?Patient is a 71 year old male with history of BPH, CLL, hypertension who presented with right foot swelling, purple discoloration, pain, inability to ambulate.  On presentation he was found to have ischemic right lower extremity, started on heparin drip.  Vascular surgery consulted.  Found to have critical right lower extremity ischemia, status post right  lower extremity angiogram.  Vascular surgery did  bypass surgery with thrombectomy.  Hospital course also remarkable for coffee-ground emesis with a stable hemoglobin.  The limb turned out to be  unsalvageable so vascular surgery planning for right BKA  ?  ?06/18/2018 pain tolerable and better controlled no BM. Tele no evidence of nsvt, only pvc, paced. Short Run of what appears to be svt. ? ? ?Consultants:  ?Vascular surgery ? ?Procedures:  ? ?Antimicrobials:  ?  ? ? ?Subjective: ?No sob , cp, dizziness ? ?Objective: ?Vitals:  ? 06/26/21 2009 06/27/21 0140 06/27/21 0400 06/27/21 0734  ?BP: 123/63 137/78 (!) 161/71 (!) 152/81  ?Pulse: 97 89 97 93  ?Resp: '18 20 20 20  '$ ?Temp: 97.7 ?F (36.5 ?C)  98.1 ?F (36.7 ?C) 98.4 ?F (36.9 ?C)  ?TempSrc: Oral  Oral Oral  ?SpO2: 94% 94% 95% 95%  ?Weight:      ?Height:      ? ? ?Intake/Output Summary (Last 24 hours) at 06/27/2021 0914 ?Last data filed at 06/27/2021 0856 ?Gross per 24 hour  ?Intake 3 ml  ?Output 5075 ml  ?Net -5072 ml  ? ?Filed Weights  ? 06/18/21 1200  ?Weight: 102.1 kg  ? ? ?Examination: ?Calm, NAD ?Cta no w/r ?Reg s1/s2 no gallop ?Soft benign +bs ?No edema, RLE stump sock in place. ?Aaoxox3  ?Mood and affect appropriate in current setting  ? ? ? ?Data Reviewed: I have personally reviewed following labs and imaging studies ? ?CBC: ?Recent Labs  ?Lab 06/22/21 ?0023 06/22/21 ?0600 06/22/21 ?1103 06/23/21 ?0130 06/23/21 ?1013 06/24/21 ?0203 06/24/21 ?3474 06/24/21 ?1645 06/25/21 ?2595  06/26/21 ?0240  ?WBC 14.8* 14.8*  --  11.9*  --   --  10.1  --   --  12.5*  ?NEUTROABS  --   --   --   --   --   --   --   --   --  10.5*  ?HGB 14.7 16.0   < > 12.1*   < > 10.7* 10.4* 10.3* 10.0* 9.8*  ?HCT 44.8 47.2   < > 35.6*   < > 32.6* 31.5* 31.4* 31.0* 29.7*  ?MCV 88.0 87.2  --  88.6  --   --  89.7  --   --  89.7  ?PLT 449* 391  --  400  --   --  370  --   --  475*  ? < > = values in this interval not displayed.  ? ?Basic Metabolic Panel: ?Recent Labs  ?Lab 06/21/21 ?0401 06/22/21 ?0023 06/22/21 ?1103 06/22/21 ?1230 06/23/21 ?0130 06/25/21 ?6387 06/26/21 ?5643 06/27/21 ?0107 06/27/21 ?0241  ?NA 131* 133* 134* 133* 135 131* 130* 132*  --   ?K 3.2* 3.4* 3.2* 3.4* 3.6 3.3* 4.2  --  4.2  ?CL 94* 93* 90*  --  97* 93* 97*  --   --   ?CO2 26 29  --   --  '29 28 25  '$ --   --   ?GLUCOSE 141* 164* 183*  --  154*  161* 150*  --   --   ?BUN 20 21 28*  --  25* 17 14  --   --   ?CREATININE 0.86 0.75 0.70  --  0.89 0.73 0.71  --   --   ?CALCIUM 8.7* 8.8*  --   --  7.6* 7.6* 7.7*  --   --   ?MG  --   --   --   --   --   --   --   --  2.1  ? ?GFR: ?Estimated Creatinine Clearance: 103.2 mL/min (by C-G formula based on SCr of 0.71 mg/dL). ?Liver Function Tests: ?No results for input(s): AST, ALT, ALKPHOS, BILITOT, PROT, ALBUMIN in the last 168 hours. ?No results for input(s): LIPASE, AMYLASE in the last 168 hours. ?No results for input(s): AMMONIA in the last 168 hours. ?Coagulation Profile: ?No results for input(s): INR, PROTIME in the last 168 hours. ?Cardiac Enzymes: ?No results for input(s): CKTOTAL, CKMB, CKMBINDEX, TROPONINI in the last 168 hours. ?BNP (last 3 results) ?No results for input(s): PROBNP in the last 8760 hours. ?HbA1C: ?No results for input(s): HGBA1C in the last 72 hours. ?CBG: ?Recent Labs  ?Lab 06/26/21 ?0604 06/26/21 ?1159 06/26/21 ?1616 06/26/21 ?2125 06/27/21 ?8341  ?GLUCAP 130* 128* 161* 169* 191*  ? ?Lipid Profile: ?No results for input(s): CHOL, HDL, LDLCALC, TRIG, CHOLHDL, LDLDIRECT in the last 72  hours. ?Thyroid Function Tests: ?No results for input(s): TSH, T4TOTAL, FREET4, T3FREE, THYROIDAB in the last 72 hours. ?Anemia Panel: ?No results for input(s): VITAMINB12, FOLATE, FERRITIN, TIBC, IRON, RETICCTPCT in the last 72 hours. ?Sepsis Labs: ?No results for input(s): PROCALCITON, LATICACIDVEN in the last 168 hours. ? ?Recent Results (from the past 240 hour(s))  ?Resp Panel by RT-PCR (Flu A&B, Covid) Nasopharyngeal Swab     Status: None  ? Collection Time: 06/18/21 10:44 AM  ? Specimen: Nasopharyngeal Swab; Nasopharyngeal(NP) swabs in vial transport medium  ?Result Value Ref Range Status  ? SARS Coronavirus 2 by RT PCR NEGATIVE NEGATIVE Final  ?  Comment: (NOTE) ?SARS-CoV-2 target nucleic acids are NOT DETECTED. ? ?The SARS-CoV-2 RNA is generally detectable in upper respiratory ?specimens during the acute phase of infection. The lowest ?concentration of SARS-CoV-2 viral copies this assay can detect is ?138 copies/mL. A negative result does not preclude SARS-Cov-2 ?infection and should not be used as the sole basis for treatment or ?other patient management decisions. A negative result may occur with  ?improper specimen collection/handling, submission of specimen other ?than nasopharyngeal swab, presence of viral mutation(s) within the ?areas targeted by this assay, and inadequate number of viral ?copies(<138 copies/mL). A negative result must be combined with ?clinical observations, patient history, and epidemiological ?information. The expected result is Negative. ? ?Fact Sheet for Patients:  ?EntrepreneurPulse.com.au ? ?Fact Sheet for Healthcare Providers:  ?IncredibleEmployment.be ? ?This test is no t yet approved or cleared by the [place] FDA and  ?has been authorized for detection and/or diagnosis of SARS-CoV-2 by ?FDA under an Emergency Use Authorization (EUA). This EUA will remain  ?in effect (meaning this test can be used) for the duration of the ?COVID-19  declaration under Section 564(b)(1) of the Act, 21 ?U.S.C.section 360bbb-3(b)(1), unless the authorization is terminated  ?or revoked sooner.  ? ? ?  ? Influenza A by PCR NEGATIVE NEGATIVE Final  ? Influenza B by PCR NEGATIVE NEGATIVE Final  ?  Comment: (NOTE) ?The Xpert Xpress SARS-CoV-2/FLU/RSV plus assay is intended as an aid ?in the diagnosis of influenza from Nasopharyngeal swab  specimens and ?should not be used as a sole basis for treatment. Nasal washings and ?aspirates are unacceptable for Xpert Xpress SARS-CoV-2/FLU/RSV ?testing. ? ?Fact Sheet for Patients: ?EntrepreneurPulse.com.au ? ?Fact Sheet for Healthcare Providers: ?IncredibleEmployment.be ? ?This test is not yet approved or cleared by the [place] FDA and ?has been authorized for detection and/or diagnosis of SARS-CoV-2 by ?FDA under an Emergency Use Authorization (EUA). This EUA will remain ?in effect (meaning this test can be used) for the duration of the ?COVID-19 declaration under Section 564(b)(1) of the Act, 21 U.S.C. ?section 360bbb-3(b)(1), unless the authorization is terminated or ?revoked. ? ?Performed at [facility], [place] [address] [place], [place] ?[address] ?  ?Surgical pcr screen     Status: Abnormal  ? Collection Time: 06/21/21  8:35 PM  ? Specimen: Nasal Mucosa; Nasal Swab  ?Result Value Ref Range Status  ? MRSA, PCR NEGATIVE NEGATIVE Final  ? Staphylococcus aureus POSITIVE (A) NEGATIVE Final  ?  Comment: (NOTE) ?The Xpert SA Assay (FDA approved for NASAL specimens in patients 57 ?years of age and older), is one component of a comprehensive ?surveillance program. It is not intended to diagnose infection nor to ?guide or monitor treatment. ?Performed at [facility], [place] [address], [place], [place] ?[address] ?  ?  ? ? ? ? ? ?Radiology Studies: ?No results found. ? ? ? ? ? ?Scheduled Meds: ? acetaminophen  650 mg Oral Q6H  ? amLODipine  5 mg Oral Daily  ? aspirin EC  81 mg Oral  Daily  ? Chlorhexidine Gluconate Cloth  6 each Topical Daily  ? docusate sodium  100 mg Oral BID  ? gabapentin  300 mg Oral BID  ? heparin injection (subcutaneous)  5,000 Units Subcutaneous Q8H  ? hydrochlorothiazid

## 2021-06-28 DIAGNOSIS — E440 Moderate protein-calorie malnutrition: Secondary | ICD-10-CM | POA: Insufficient documentation

## 2021-06-28 DIAGNOSIS — I70221 Atherosclerosis of native arteries of extremities with rest pain, right leg: Secondary | ICD-10-CM | POA: Diagnosis not present

## 2021-06-28 LAB — BASIC METABOLIC PANEL
Anion gap: 10 (ref 5–15)
BUN: 16 mg/dL (ref 8–23)
CO2: 27 mmol/L (ref 22–32)
Calcium: 8.4 mg/dL — ABNORMAL LOW (ref 8.9–10.3)
Chloride: 95 mmol/L — ABNORMAL LOW (ref 98–111)
Creatinine, Ser: 0.93 mg/dL (ref 0.61–1.24)
GFR, Estimated: >60 mL/min (ref >60)
Glucose, Bld: 170 mg/dL — ABNORMAL HIGH (ref 70–99)
Potassium: 4.4 mmol/L (ref 3.5–5.1)
Sodium: 132 mmol/L — ABNORMAL LOW (ref 135–145)

## 2021-06-28 LAB — SURGICAL PATHOLOGY

## 2021-06-28 LAB — GLUCOSE, CAPILLARY
Glucose-Capillary: 154 mg/dL — ABNORMAL HIGH (ref 70–99)
Glucose-Capillary: 188 mg/dL — ABNORMAL HIGH (ref 70–99)
Glucose-Capillary: 171 mg/dL — ABNORMAL HIGH (ref 70–99)
Glucose-Capillary: 131 mg/dL — ABNORMAL HIGH (ref 70–99)

## 2021-06-28 LAB — MAGNESIUM: Magnesium: 2.1 mg/dL (ref 1.7–2.4)

## 2021-06-28 MED ORDER — METOPROLOL TARTRATE 25 MG PO TABS
25.0000 mg | ORAL_TABLET | Freq: Two times a day (BID) | ORAL | Status: DC
Start: 2021-06-28 — End: 2021-06-28
  Administered 2021-06-28: 25 mg via ORAL
  Filled 2021-06-28: qty 1

## 2021-06-28 MED ORDER — GABAPENTIN 300 MG PO CAPS
300.0000 mg | ORAL_CAPSULE | Freq: Three times a day (TID) | ORAL | Status: DC
  Administered 2021-06-28 – 2021-07-01 (×10): 300 mg via ORAL
  Filled 2021-06-28 (×10): qty 1

## 2021-06-28 MED ORDER — METOPROLOL TARTRATE 25 MG PO TABS
25.0000 mg | ORAL_TABLET | Freq: Two times a day (BID) | ORAL | Status: DC
Start: 2021-06-29 — End: 2021-06-29

## 2021-06-28 NOTE — Consult Note (Addendum)
? ?ELECTROPHYSIOLOGY CONSULT NOTE  ? ? ?Patient ID: Douglas [name] ?MRN: [id number], DOB/AGE: 73-15-1952 73 y.o. ? ?Admit date: 06/18/2021 ?Date of Consult: 06/28/2021 ? ?Primary Physician: [name], MD ?Primary Cardiologist: None  ?Electrophysiologist: [name] ? ?Referring Provider: Dr. Kurtis Bushman ? ?Patient Profile: ?Douglas [name] is a 71 y.o. 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 Kurtis Bushman. ? ?HPI:  ?Douglas [name] is a 71 y.o. male with medical history as above who presented 3/10 with R foot swelling, discoloration, and pain. Was felt to have worsening claudication of the R foot and started on heparin. ABI showed severe RLE arterial disease.  ? ?Ultimately, pt underwent BKA for non-viable R foot.  ? ?Overnight, pt had several episodes of NSVT concerning for PMVT over the weekend.  EP asked to weigh in.  ? ?Pt currently in no acute distress lying in bed. States he is mildly fatigued, but denies lightheadedness or dizziness when standing or working with PT/OT.  Denies tachy-palpitations or chest pain. Wants to get in touch with his Oncologist to make sure his medications are being kept as they should be for his CLL.  ? ?Past Medical History:  ?Diagnosis Date  ? BPH (benign prostatic hyperplasia)   ? CLL (chronic lymphocytic leukemia) ([place])   ? Hypertension   ? Pacemaker   ? Medtronic Device  ?  ? ?Surgical History:  ?Past Surgical History:  ?Procedure Laterality Date  ? ABDOMINAL AORTOGRAM W/LOWER EXTREMITY Bilateral 06/21/2021  ? Procedure: ABDOMINAL AORTOGRAM W/LOWER EXTREMITY;  Surgeon: Waynetta Sandy, MD;  Location: [facility];  Service: Cardiovascular;  Laterality: Bilateral;  ? AMPUTATION Right 06/25/2021  ? Procedure: RIGHT BELOW KNEE AMPUTATION;  Surgeon: Waynetta Sandy, MD;  Location: [facility];  Service: Vascular;  Laterality: Right;  ? FEMORAL-POPLITEAL BYPASS  GRAFT Right 06/22/2021  ? Procedure: RIGHT FEMORAL-POPLITEAL BYPASS WITH VEIN, RIGHT POPLITEAL THROMBECTOMY;  Surgeon: Waynetta Sandy, MD;  Location: [facility];  Service: Vascular;  Laterality: Right;  ? LEFT HEART CATH AND CORONARY ANGIOGRAPHY N/A 01/18/2021  ? Procedure: LEFT HEART CATH AND CORONARY ANGIOGRAPHY;  Surgeon: Burnell Blanks, MD;  Location: [facility];  Service: Cardiovascular;  Laterality: N/A;  ? PACEMAKER IMPLANT N/A 01/19/2021  ? Procedure: PACEMAKER IMPLANT;  Surgeon: Evans Lance, MD;  Location: [facility];  Service: Cardiovascular;  Laterality: N/A;  ?  ? ?Medications Prior to Admission  ?Medication Sig Dispense Refill Last Dose  ? albuterol (PROVENTIL HFA;VENTOLIN HFA) 108 (90 BASE) MCG/ACT inhaler Inhale 2 puffs into the lungs 4 (four) times daily. (Patient taking differently: Inhale 2 puffs into the lungs every 6 (six) hours as needed for shortness of breath or wheezing.) 1 Inhaler 0 unk  ? amLODipine (NORVASC) 5 MG tablet Take 5 mg by mouth daily.   06/17/2021  ? IMBRUVICA 420 MG tablet TAKE 1 TABLET BY MOUTH ONCE  DAILY WITH A FULL GLASS OF WATER (Patient taking differently: 420 mg daily. WITH A FULL GLASS OF  WATER) 28 tablet 1 06/17/2021  ? Lidocaine 4 % LOTN Apply 1 application. topically daily as needed (pain).   06/17/2021  ? montelukast (SINGULAIR) 10 MG tablet Take 10 mg by mouth daily as needed (allergies).   unk  ? oxymetazoline (AFRIN) 0.05 % nasal spray Place 1 spray into both nostrils 2 (two) times daily as needed for congestion.  06/18/2021  ? terazosin (HYTRIN) 10 MG capsule Take 10 mg by mouth at bedtime.   06/18/2021  ? valsartan-hydrochlorothiazide (DIOVAN-HCT) 320-25 MG tablet Take 1 tablet by mouth daily.   06/18/2021  ? ? ?Inpatient Medications:  ? acetaminophen  650 mg Oral Q6H  ? amLODipine  5 mg Oral Daily  ? aspirin EC  81 mg Oral Daily  ? Chlorhexidine Gluconate Cloth  6 each Topical Daily  ? docusate sodium  100 mg Oral BID  ? gabapentin   300 mg Oral BID  ? heparin injection (subcutaneous)  5,000 Units Subcutaneous Q8H  ? hydrochlorothiazide  25 mg Oral Daily  ? insulin aspart  0-5 Units Subcutaneous QHS  ? insulin aspart  0-9 Units Subcutaneous TID WC  ? irbesartan  300 mg Oral Daily  ? methocarbamol  500 mg Oral TID  ? multivitamin with minerals  1 tablet Oral Daily  ? pantoprazole  40 mg Oral BID  ? polyethylene glycol  17 g Oral BID  ? rosuvastatin  20 mg Oral Daily  ? senna  1 tablet Oral BID  ? sodium chloride flush  3 mL Intravenous Q12H  ? sodium chloride flush  3 mL Intravenous Q12H  ? terazosin  10 mg Oral QHS  ? ? ?Allergies: Not on File ? ?Social History  ? ?Socioeconomic History  ? Marital status: Married  ?  Spouse name: Not on file  ? Number of children: Not on file  ? Years of education: Not on file  ? Highest education level: Not on file  ?Occupational History  ? Occupation: retired  ?Tobacco Use  ? Smoking status: Former  ?  Packs/day: 0.50  ?  Years: 10.00  ?  Pack years: 5.00  ?  Types: Cigarettes  ? Smokeless tobacco: Never  ?Vaping Use  ? Vaping Use: Never used  ?Substance and Sexual Activity  ? Alcohol use: Not Currently  ? Drug use: Never  ? Sexual activity: Not on file  ?Other Topics Concern  ? Not on file  ?Social History Narrative  ? Not on file  ? ?Social Determinants of Health  ? ?Financial Resource Strain: Not on file  ?Food Insecurity: Not on file  ?Transportation Needs: Not on file  ?Physical Activity: Not on file  ?Stress: Not on file  ?Social Connections: Not on file  ?Intimate Partner Violence: Not on file  ?  ? ?Family History  ?Problem Relation Age of Onset  ? Lung cancer Mother   ? Coronary artery disease Father   ?  ? ?Review of Systems: ?All other systems reviewed and are otherwise negative except as noted above. ? ?Physical Exam: ?Vitals:  ? 06/27/21 2335 06/28/21 0446 06/28/21 0758 06/28/21 1106  ?BP: 133/79 (!) 151/73 (!) 160/71 123/64  ?Pulse: 96 89 88 (!) 104  ?Resp: '18 16 15 16  '$ ?Temp: 97.7 ?F (36.5 ?C)  98.5 ?F (36.9 ?C) 98.3 ?F (36.8 ?C) 98.5 ?F (36.9 ?C)  ?TempSrc: Oral Oral Oral Oral  ?SpO2: 96% 97% 96% 96%  ?Weight:      ?Height:      ? ? ?GEN- The patient is well appearing, alert and oriented x 3 today.   ?HEENT: normocephalic, atraumatic; sclera clear, conjunctiva pink; hearing intact; oropharynx clear; neck supple ?Lungs- Clear to ausculation bilaterally, normal work of breathing.  No wheezes, rales, rhonchi ?Heart- Regular rate and rhythm, no murmurs, rubs or gallops ?GI- soft, non-tender, non-distended, bowel sounds present ?Extremities- no clubbing, cyanosis, or edema; DP/PT/radial pulses 2+  bilaterally ?MS- no significant deformity or atrophy ?Skin- warm and dry, no rash or lesion ?Psych- euthymic mood, full affect ?Neuro- strength and sensation are intact ? ?Labs: ?  ?Lab Results  ?Component Value Date  ? WBC 12.5 (H) 06/26/2021  ? HGB 9.8 (L) 06/26/2021  ? HCT 29.7 (L) 06/26/2021  ? MCV 89.7 06/26/2021  ? PLT 475 (H) 06/26/2021  ?  ?Recent Labs  ?Lab 06/26/21 ?8416 06/27/21 ?0107 06/27/21 ?0241  ?NA 130* 132*  --   ?K 4.2  --  4.2  ?CL 97*  --   --   ?CO2 25  --   --   ?BUN 14  --   --   ?CREATININE 0.71  --   --   ?CALCIUM 7.7*  --   --   ?GLUCOSE 150*  --   --   ? ? ?  ?Radiology/Studies: DG Abd 1 View ? ?Result Date: 06/22/2021 ?CLINICAL DATA:  Abdominal pain and distension. EXAM: ABDOMEN - 1 VIEW COMPARISON:  None. FINDINGS: There is diffuse gaseous distension of the gastric lumen. No dilated loops of large or small bowel. IMPRESSION: Diffuse gaseous distension of the gastric lumen. Electronically Signed   By: Kerby Moors M.D.   On: 06/22/2021 06:04  ? ?PERIPHERAL VASCULAR CATHETERIZATION ? ?Result Date: 06/21/2021 ?Images from the original result were not included. Patient name: Douglas [name] MRN: [id number] DOB: 1951-01-04 Sex: male 06/21/2021 Pre-operative Diagnosis: Critical right lower extremity ischemia with rest pain Post-operative diagnosis:  Same Surgeon:  Eda Paschal. Donzetta Matters, MD Procedure  Performed: 1.  Ultrasound-guided cannulation left common femoral artery 2.  Aortogram 3.  Selection of right common femoral artery and right lower extremity angiogram 4.  Left lower extremity angiogram 5.  M

## 2021-06-28 NOTE — Progress Notes (Addendum)
[facility]  ? ?Subjective  - No new complaints this am.   ? ? ?Objective ?(!) 151/73 ?89 ?98.5 ?F (36.9 ?C) (Oral) ?16 ?97% ? ?Intake/Output Summary (Last 24 hours) at 06/28/2021 0721 ?Last data filed at 06/28/2021 0600 ?Gross per 24 hour  ?Intake 3 ml  ?Output 4950 ml  ?Net -4947 ml  ? ? ?Right BKA stump with minimal bloody drainage on guaze. ?No erythema or ischemic skin changes ?Replaced dry guaze with ace.  Will return to black compression stump sock when there is no drainage. ?Lungs non labored breathing ? ?Assessment/Planning: ?POD # 3 right BKA ? ?Stump healing well and appears viable ?Dry dressing with ace until no SS drainage.  Has stump sock in room. ?Pending discharge disposition hopeful for CIR. ? ?Douglas [name] ?06/28/2021 ?7:21 AM ?-- ? ?Laboratory ?Lab Results: ?Recent Labs  ?  06/26/21 ?0240  ?WBC 12.5*  ?HGB 9.8*  ?HCT 29.7*  ?PLT 475*  ? ?BMET ?Recent Labs  ?  06/26/21 ?1497 06/27/21 ?0107 06/27/21 ?0241  ?NA 130* 132*  --   ?K 4.2  --  4.2  ?CL 97*  --   --   ?CO2 25  --   --   ?GLUCOSE 150*  --   --   ?BUN 14  --   --   ?CREATININE 0.71  --   --   ?CALCIUM 7.7*  --   --   ? ? ?COAG ?Lab Results  ?Component Value Date  ? INR 1.0 06/18/2021  ? INR 1.0 01/16/2021  ? ?No results found for: PTT ? ?I have independently interviewed and examined patient and agree with PA assessment and plan above.  ? ?Douglas [name] C. Donzetta Matters, MD ?[facility] ?Office: [phone] ?Pager: [phone] ? ? ?

## 2021-06-28 NOTE — Progress Notes (Signed)
?PROGRESS NOTE ? ? ? ?Douglas [name]  XHB:[id number] DOB: 08-21-1950 DOA: 06/18/2021 ?PCP: [name], MD  ? ? ?Brief Narrative:  ?Patient is a 71 year old male with history of BPH, CLL, hypertension who presented with right foot swelling, purple discoloration, pain, inability to ambulate.  On presentation he was found to have ischemic right lower extremity, started on heparin drip.  Vascular surgery consulted.  Found to have critical right lower extremity ischemia, status post right  lower extremity angiogram.  Vascular surgery did  bypass surgery with thrombectomy.  Hospital course also remarkable for coffee-ground emesis with a stable hemoglobin.  The limb turned out to be  unsalvageable so vascular surgery planning for right BKA . Now s/p Rt BKA ? ? 3/20 night MD reported NSVT. Cardiology consulted ? ?Consultants:  ?Vascular surgery ? ?Procedures:  ? ?Antimicrobials:  ?  ? ? ?Subjective: ?Pain better controlled.  No cp or Shortness of breath ? ?Objective: ?Vitals:  ? 06/27/21 2335 06/28/21 0446 06/28/21 0758 06/28/21 1106  ?BP: 133/79 (!) 151/73 (!) 160/71 123/64  ?Pulse: 96 89 88 (!) 104  ?Resp: '18 16 15 16  '$ ?Temp: 97.7 ?F (36.5 ?C) 98.5 ?F (36.9 ?C) 98.3 ?F (36.8 ?C) 98.5 ?F (36.9 ?C)  ?TempSrc: Oral Oral Oral Oral  ?SpO2: 96% 97% 96% 96%  ?Weight:      ?Height:      ? ? ?Intake/Output Summary (Last 24 hours) at 06/28/2021 1514 ?Last data filed at 06/28/2021 1107 ?Gross per 24 hour  ?Intake 3 ml  ?Output 3900 ml  ?Net -3897 ml  ? ?Filed Weights  ? 06/18/21 1200  ?Weight: 102.1 kg  ? ? ?Examination: ?Calm, NAD ?Cta no w/r ?Reg s1/s2 no gallop ?Soft benign +bs ?No edema, Rt BKA ?Aaoxox3  ?Mood and affect appropriate in current setting  ? ? ? ?Data Reviewed: I have personally reviewed following labs and imaging studies ? ?CBC: ?Recent Labs  ?Lab 06/22/21 ?0023 06/22/21 ?0600 06/22/21 ?1103 06/23/21 ?0130 06/23/21 ?1013 06/24/21 ?0203 06/24/21 ?8101 06/24/21 ?1645 06/25/21 ?7510 06/26/21 ?0240  ?WBC 14.8* 14.8*   --  11.9*  --   --  10.1  --   --  12.5*  ?NEUTROABS  --   --   --   --   --   --   --   --   --  10.5*  ?HGB 14.7 16.0   < > 12.1*   < > 10.7* 10.4* 10.3* 10.0* 9.8*  ?HCT 44.8 47.2   < > 35.6*   < > 32.6* 31.5* 31.4* 31.0* 29.7*  ?MCV 88.0 87.2  --  88.6  --   --  89.7  --   --  89.7  ?PLT 449* 391  --  400  --   --  370  --   --  475*  ? < > = values in this interval not displayed.  ? ?Basic Metabolic Panel: ?Recent Labs  ?Lab 06/22/21 ?0023 06/22/21 ?1103 06/22/21 ?1230 06/23/21 ?0130 06/25/21 ?2585 06/26/21 ?2778 06/27/21 ?0107 06/27/21 ?0241  ?NA 133* 134* 133* 135 131* 130* 132*  --   ?K 3.4* 3.2* 3.4* 3.6 3.3* 4.2  --  4.2  ?CL 93* 90*  --  97* 93* 97*  --   --   ?CO2 29  --   --  '29 28 25  '$ --   --   ?GLUCOSE 164* 183*  --  154* 161* 150*  --   --   ?BUN 21 28*  --  25* 17 14  --   --   ?CREATININE 0.75 0.70  --  0.89 0.73 0.71  --   --   ?CALCIUM 8.8*  --   --  7.6* 7.6* 7.7*  --   --   ?MG  --   --   --   --   --   --   --  2.1  ? ?GFR: ?Estimated Creatinine Clearance: 103.2 mL/min (by C-G formula based on SCr of 0.71 mg/dL). ?Liver Function Tests: ?No results for input(s): AST, ALT, ALKPHOS, BILITOT, PROT, ALBUMIN in the last 168 hours. ?No results for input(s): LIPASE, AMYLASE in the last 168 hours. ?No results for input(s): AMMONIA in the last 168 hours. ?Coagulation Profile: ?No results for input(s): INR, PROTIME in the last 168 hours. ?Cardiac Enzymes: ?No results for input(s): CKTOTAL, CKMB, CKMBINDEX, TROPONINI in the last 168 hours. ?BNP (last 3 results) ?No results for input(s): PROBNP in the last 8760 hours. ?HbA1C: ?No results for input(s): HGBA1C in the last 72 hours. ?CBG: ?Recent Labs  ?Lab 06/27/21 ?1117 06/27/21 ?1716 06/27/21 ?2127 06/28/21 ?0612 06/28/21 ?1109  ?GLUCAP 143* 140* 179* 154* 188*  ? ?Lipid Profile: ?No results for input(s): CHOL, HDL, LDLCALC, TRIG, CHOLHDL, LDLDIRECT in the last 72 hours. ?Thyroid Function Tests: ?No results for input(s): TSH, T4TOTAL, FREET4, T3FREE,  THYROIDAB in the last 72 hours. ?Anemia Panel: ?No results for input(s): VITAMINB12, FOLATE, FERRITIN, TIBC, IRON, RETICCTPCT in the last 72 hours. ?Sepsis Labs: ?No results for input(s): PROCALCITON, LATICACIDVEN in the last 168 hours. ? ?Recent Results (from the past 240 hour(s))  ?Surgical pcr screen     Status: Abnormal  ? Collection Time: 06/21/21  8:35 PM  ? Specimen: Nasal Mucosa; Nasal Swab  ?Result Value Ref Range Status  ? MRSA, PCR NEGATIVE NEGATIVE Final  ? Staphylococcus aureus POSITIVE (A) NEGATIVE Final  ?  Comment: (NOTE) ?The Xpert SA Assay (FDA approved for NASAL specimens in patients 26 ?years of age and older), is one component of a comprehensive ?surveillance program. It is not intended to diagnose infection nor to ?guide or monitor treatment. ?Performed at [facility], [place] [address] [place], [place] ?[address] ?  ?  ? ? ? ? ? ?Radiology Studies: ?No results found. ? ? ? ? ? ?Scheduled Meds: ? acetaminophen  650 mg Oral Q6H  ? amLODipine  5 mg Oral Daily  ? aspirin EC  81 mg Oral Daily  ? Chlorhexidine Gluconate Cloth  6 each Topical Daily  ? docusate sodium  100 mg Oral BID  ? gabapentin  300 mg Oral BID  ? heparin injection (subcutaneous)  5,000 Units Subcutaneous Q8H  ? hydrochlorothiazide  25 mg Oral Daily  ? insulin aspart  0-5 Units Subcutaneous QHS  ? insulin aspart  0-9 Units Subcutaneous TID WC  ? irbesartan  300 mg Oral Daily  ? methocarbamol  500 mg Oral TID  ? multivitamin with minerals  1 tablet Oral Daily  ? pantoprazole  40 mg Oral BID  ? polyethylene glycol  17 g Oral BID  ? rosuvastatin  20 mg Oral Daily  ? senna  1 tablet Oral BID  ? sodium chloride flush  3 mL Intravenous Q12H  ? sodium chloride flush  3 mL Intravenous Q12H  ? terazosin  10 mg Oral QHS  ? ?Continuous Infusions: ? sodium chloride    ? sodium chloride    ? magnesium sulfate bolus IVPB    ? ? ?Assessment & Plan: ?  ?  Principal Problem: ?  Critical limb ischemia of right lower extremity ([place]) ?Active  Problems: ?  Coffee ground emesis ?  Physical debility ?  CLL (chronic lymphocytic leukemia) ([place]) ?  Hypertension ?  Diabetes mellitus ([place]) ?  BPH (benign prostatic hyperplasia) ?  Pacemaker ?  Constipation ?  Hypokalemia ?  Malnutrition of moderate degree ? ? ?* Critical limb ischemia of right lower extremity ([facility]) ?-Patient with h/o plantar fasciitis, last received injection for this on 2/17 ?-Within the next 1-2 weeks, he developed foot swelling, pain, discoloration ?-Upon arrival in the podiatry office, there was concern for limb ischemia and he was sent to the ER ?-He was started on heparin infusion ?-ABIs showed severe right lower extremity arterial disease, moderate left lower extremity arterial disease ?-Found to have critical right lower extremity ischemia.  Status post angiogram.  Vascular surgery did bypass, thrombectomy on 3/14. ?-There is a concern from vascular surgery that his bypass procedure has failed,so plan for rt bka ?3/18 dispo right BKA done on 3/17 by Dr. Vella Redhead ?3/19Pain better controlled ?Continue gabapentin ?dressing changed today, hemodynamically stable.   ?PT/OT rec. CIR ?F/u with vascular in 1 month for staple removal ?3/20 per vascular stump healing well appears viable.  Dry dressing with Ace until no SS drainage. ?Discharge planning possible to CIR versus SNF ? ?  ?Coffee ground emesis ?Had significant coffee-ground emesis on 3/14.  There was about 800 cc of dark emesis collected in a can.  Interestingly, patient does not have any symptoms, no abdominal pain, nausea or vomiting.  Hemoglobin has dropped from baseline but has remained stable now.  Started on Protonix drip.   ?3/18 spoke to vascular PA no need for heparin drip  ?H&H remained stable.  Has not had any coffee-ground emesis except that 1 episode.   ?He does state he has never had EGD or colonoscopy. ?3/19 no further episodes ?We will need GI referral on discharge ? ? ? ? ?  ?Physical debility ?PT to evaluate CIR versus  SNF ? ?Hypokalemia ?Replaced and stable ? ? ?Hyponatremia ?Improving ,asymptomatic ? ? ?CLL (chronic lymphocytic leukemia) ([place]) ?-Diagnosed in 11/2018.   ?-Has done well with ibrutinib, and is continuing this medica

## 2021-06-28 NOTE — Progress Notes (Signed)
Physical Therapy Treatment ?Patient Details ?Name: Douglas [name] ?MRN: [id number] ?DOB: [birth year] ?Today's Date: 06/28/2021 ? ? ?History of Present Illness Patient is a 71 yo male admitted on 06/18/2021 with right foot swelling concerning for ischemic leg. Status post bypass and thrombectomy 06/22/21. 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 PMH: pacemaker, DM, HTN, and CLL. ? ?  ?PT Comments  ? ? Pt received in supine, c/o fatigue after recently returning to bed from stand pivot transfer with nursing staff but agreeable to therapy session with encouragement. Pt able to perform sit<>stand from elevated bed height to Stedy and lateral seated scooting with +1 modA and bed mobility with minA from flat bed using rail. Pt also performed supine/seated LE exercises. Discussed likely progression of therapies, signs of infection, use of limb guard for safety with OOB transfers and benefits of mobility. Pt c/o pain in B hands due to IV in each hand inhibiting his ability to scoot well, MD notified. Pt continues to benefit from PT services to progress toward functional mobility goals.   ?Recommendations for follow up therapy are one component of a multi-disciplinary discharge planning process, led by the attending physician.  Recommendations may be updated based on patient status, additional functional criteria and insurance authorization. ? ?Follow Up Recommendations ? Acute inpatient rehab (3hours/day) ?  ?  ?Assistance Recommended at Discharge Frequent or constant Supervision/Assistance  ?Patient can return home with the following Two people to help with walking and/or transfers;A lot of help with bathing/dressing/bathroom;Assistance with cooking/housework;Assist for transportation;Help with stairs or ramp for entrance ?  ?Equipment Recommendations ? Rolling walker (2 wheels);BSC/3in1;Wheelchair (measurements PT);Wheelchair cushion (measurements PT);Hospital bed (may change with progression)  ?  ?Recommendations for Other Services Rehab consult ? ? ?   ?Precautions / Restrictions Precautions ?Precautions: Fall ?Precaution Comments: R BKA 3/17 ?Required Braces or Orthoses: Other Brace ?Other Brace: limb guard ?Restrictions ?Weight Bearing Restrictions: Yes ?RLE Weight Bearing: Non weight bearing  ?  ? ?Mobility ? Bed Mobility ?Overal bed mobility: Needs Assistance ?Bed Mobility: Rolling, Sidelying to Sit, Sit to Sidelying ?Rolling: Min assist ?Sidelying to sit: Min assist ?  ?  ?Sit to sidelying: Min assist ?General bed mobility comments: Extra time with reliance on bed rails to ascend trunk. HOB flat and cues for log roll sequencing, minA trunk assist to rise. min to modA for lateral seated scooting toward [facility] ?  ? ?Transfers ?Overall transfer level: Needs assistance ?Equipment used: Ambulation equipment used ?Transfers: Sit to/from Stand, Bed to chair/wheelchair/BSC ?Sit to Stand: From elevated surface, Mod assist ?  ?  ?  ?  ?  ?General transfer comment: Pt performed sit>stand to [facility] with modA x2 reps (from slightly elevated bed height and from raised Stedy seat). Pt scooting laterally EOB towards his R, modA with bed pad and cues for utilizing LLE as well. Pt limited due to IV in back of each hand causing pain with pushing to scoot, MD notified. ?  ? ?Ambulation/Gait ?  ?  ?  ?  ?  ?  ?  ?General Gait Details: deferred due to pt fatigue after recent stand pivot to/from [facility] with nursing staff ? ? ?Stairs ?  ?  ?  ?  ?  ? ? ?Wheelchair Mobility ?  ? ?Modified Rankin (Stroke Patients Only) ?  ? ? ?  ?Balance Overall balance assessment: Needs assistance ?Sitting-balance support: Feet supported, No upper extremity supported ?Sitting balance-Leahy Scale: Fair ?Sitting balance - Comments: Needs support from at least 1UE ?  ?Standing balance support: Reliant  on assistive device for balance ?Standing balance-Leahy Scale: Poor ?Standing balance comment: modA for static standing, pt able to tolerate ~1 minute prior to needing to sit ?  ?  ?  ?  ?  ?  ?  ?  ?  ?  ?  ?   ? ?  ?Cognition Arousal/Alertness: Awake/alert ?Behavior During Therapy: [facility] for tasks assessed/performed ?Overall Cognitive Status: Within Functional Limits for tasks assessed ?  ?  ?  ?  ?  ?  ?  ?  ?  ?  ?  ?  ?  ?  ?  ?  ?General Comments: Pt fatigued upon therapist arrival due to recent mobility (~1.5 hrs prior) with RN but agreeable with encouragement after discussion of process for high intensity therapies recommended. ?  ?  ? ?  ?Exercises Amputee Exercises ?Quad Sets: AROM, Right, 10 reps, Supine ?Knee Flexion: AROM, Right, 5 reps, Seated ?Knee Extension: AROM, Right, 5 reps, Seated ? ?  ?General Comments General comments (skin integrity, edema, etc.): Pt instructed on residual limb positioning to prevent contractures, quad sets throughout the day, signs of infection and likely progression of therapies depending on healing times. ?  ?  ? ?Pertinent Vitals/Pain Pain Assessment ?Pain Assessment: Faces ?Faces Pain Scale: Hurts little more ?Pain Location: R residual limb ?Pain Descriptors / Indicators: Discomfort, Grimacing, Operative site guarding, Tingling ?Pain Intervention(s): Monitored during session, Premedicated before session, Repositioned  ? ? ?Home Living   ?  ?  ?  ?  ?  ?  ?  ?  ?  ?   ?  ?Prior Function    ?  ?  ?   ? ?PT Goals (current goals can now be found in the care plan section) Acute Rehab PT Goals ?Patient Stated Goal: less pain ?PT Goal Formulation: With patient ?Time For Goal Achievement: 07/10/21 ?Progress towards PT goals: Progressing toward goals ? ?  ?Frequency ? ? ? Min 3X/week ? ? ? ?  ?PT Plan Current plan remains appropriate  ? ? ?Co-evaluation   ?  ?  ?  ?  ? ?  ?AM-PAC PT "6 Clicks" Mobility   ?Outcome Measure ? Help needed turning from your back to your side while in a flat bed without using bedrails?: A Little ?Help needed moving from lying on your back to sitting on the side of a flat bed without using bedrails?: A Little ?Help needed moving to and from a bed to a chair  (including a wheelchair)?: A Lot ?Help needed standing up from a chair using your arms (e.g., wheelchair or bedside chair)?: A Lot ?Help needed to walk in hospital room?: Total ?Help needed climbing 3-5 steps with a railing? : Total ?6 Click Score: 12 ? ?  ?End of Session Equipment Utilized During Treatment: Gait belt ?Activity Tolerance: Patient tolerated treatment well ?Patient left: in bed;with call bell/phone within reach;with bed alarm set ?Nurse Communication: Mobility status ?PT Visit Diagnosis: Pain;Difficulty in walking, not elsewhere classified (R26.2);Unsteadiness on feet (R26.81);Muscle weakness (generalized) (M62.81) ?Pain - Right/Left: Right ?Pain - part of body: Leg ?  ? ? ?Time: 3810-1751 ?PT Time Calculation (min) (ACUTE ONLY): 33 min ? ?Charges:  $Therapeutic Exercise: 8-22 mins ?$Therapeutic Activity: 8-22 mins          ?          ? ?Maheen Cwikla P., PTA ?Acute Rehabilitation Services ?Pager: [phone] ?Office: [phone]  ? ? ?Kara Pacer Derita Michelsen ?06/28/2021, 6:07 PM ? ?

## 2021-06-28 NOTE — Progress Notes (Signed)
Foley D/C ? ?Post void bladder scan after 24hrs. ? ?If 250cc or more. PRN order for an in and out cath ?

## 2021-06-28 NOTE — Progress Notes (Signed)
Pt had the mother of all bowel movements today. HUGE solid and liquid bowel movement ? ?Pt ambulated with me from the bed to the bedside commode with the front wheel walker ? ?Then with the from wheel walker from the [facility] to the [place] ? ?Then right before dinner he transferred from the [place] to the bed with the front wheel walker ? ?PT tolerated well ? ?Felt weak but vitals were stable ? ?Pt took small hops/steps mostly while ambulating ?

## 2021-06-29 LAB — CBC
HCT: 32.8 % — ABNORMAL LOW (ref 39.0–52.0)
Hemoglobin: 10.6 g/dL — ABNORMAL LOW (ref 13.0–17.0)
MCH: 29.3 pg (ref 26.0–34.0)
MCHC: 32.3 g/dL (ref 30.0–36.0)
MCV: 90.6 fL (ref 80.0–100.0)
Platelets: 669 10*3/uL — ABNORMAL HIGH (ref 150–400)
RBC: 3.62 MIL/uL — ABNORMAL LOW (ref 4.22–5.81)
RDW: 15.9 % — ABNORMAL HIGH (ref 11.5–15.5)
WBC: 12.6 10*3/uL — ABNORMAL HIGH (ref 4.0–10.5)
nRBC: 0 % (ref 0.0–0.2)

## 2021-06-29 LAB — BASIC METABOLIC PANEL
Anion gap: 9 (ref 5–15)
BUN: 15 mg/dL (ref 8–23)
CO2: 26 mmol/L (ref 22–32)
Calcium: 8.1 mg/dL — ABNORMAL LOW (ref 8.9–10.3)
Chloride: 95 mmol/L — ABNORMAL LOW (ref 98–111)
Creatinine, Ser: 0.76 mg/dL (ref 0.61–1.24)
GFR, Estimated: >60 mL/min (ref >60)
Glucose, Bld: 143 mg/dL — ABNORMAL HIGH (ref 70–99)
Potassium: 4.4 mmol/L (ref 3.5–5.1)
Sodium: 130 mmol/L — ABNORMAL LOW (ref 135–145)

## 2021-06-29 LAB — GLUCOSE, CAPILLARY
Glucose-Capillary: 139 mg/dL — ABNORMAL HIGH (ref 70–99)
Glucose-Capillary: 172 mg/dL — ABNORMAL HIGH (ref 70–99)
Glucose-Capillary: 136 mg/dL — ABNORMAL HIGH (ref 70–99)
Glucose-Capillary: 161 mg/dL — ABNORMAL HIGH (ref 70–99)

## 2021-06-29 MED ORDER — AMLODIPINE BESYLATE 10 MG PO TABS
10.0000 mg | ORAL_TABLET | Freq: Every day | ORAL | Status: DC
  Administered 2021-06-29 – 2021-07-01 (×3): 10 mg via ORAL
  Filled 2021-06-29 (×3): qty 1

## 2021-06-29 MED ORDER — METOPROLOL TARTRATE 25 MG PO TABS
25.0000 mg | ORAL_TABLET | Freq: Three times a day (TID) | ORAL | Status: DC
  Administered 2021-06-29 – 2021-07-01 (×8): 25 mg via ORAL
  Filled 2021-06-29 (×8): qty 1

## 2021-06-29 NOTE — Progress Notes (Signed)
Inpatient Rehab Admissions Coordinator:  ? ?Per therapy recommendations,  patient was screened for CIR candidacy by Markelle Asaro, MS, CCC-SLP. At this time, Pt. Appears to be a a potential candidate for CIR. I will place   order for rehab consult per protocol for full assessment. Please contact me any with questions. ? ?Albaraa Swingle, MS, CCC-SLP ?Rehab Admissions Coordinator  ?[phone] (celll) ?[phone] (office) ? ?

## 2021-06-29 NOTE — Progress Notes (Signed)
No further sustained arryhtmias.  ? ?Increase lopressor to 25 mg TID. Consider consolidating to Toprol on discharge.  ? ?EP to see as needed.  ? ?Beryle Beams" Chalmers Cater, PA-C  ?06/29/2021 8:00 AM  ?

## 2021-06-29 NOTE — Progress Notes (Signed)
Patient has no urine output for more than 9 hours,no urge to urinate bladder scan done ,>999 ml,In and out cath done and drain 1100 cc urine.Will continue to monitor. ?

## 2021-06-29 NOTE — Progress Notes (Signed)
?PROGRESS NOTE ? ? ? ?Douglas [name]  TXM:[id number] DOB: 10-23-1950 DOA: 06/18/2021 ?PCP: [name], MD  ? ? ?Brief Narrative:  ?Patient is a 71 year old male with history of BPH, CLL, hypertension who presented with right foot swelling, purple discoloration, pain, inability to ambulate.  On presentation he was found to have ischemic right lower extremity, started on heparin drip.  Vascular surgery consulted.  Found to have critical right lower extremity ischemia, status post right  lower extremity angiogram.  Vascular surgery did  bypass surgery with thrombectomy.  Hospital course also remarkable for coffee-ground emesis with a stable hemoglobin.  The limb turned out to be  unsalvageable so vascular surgery planning for right BKA . Now s/p Rt BKA ? ? 3/20 night MD reported NSVT. Cardiology consulted ?3/21 d/c'd foley yesterday, post void pt still having urinary retention. Have to replace foley today. Bladder scan 817 as pt had not urinated all day. +bm yesterday, none so far ? ?Consultants:  ?Vascular surgery, cardiology ? ?Procedures:  ? ?Antimicrobials:  ?  ? ? ?Subjective: ?Had pain with dressing changes.  No shortness of breath or abdominal pain. ? ?Objective: ?Vitals:  ? 06/29/21 0349 06/29/21 0735 06/29/21 1115 06/29/21 1500  ?BP:  (!) 142/72 137/68 139/78  ?Pulse: 90 98 79   ?Resp: '18 17 18 18  '$ ?Temp: 98.4 ?F (36.9 ?C) 98.4 ?F (36.9 ?C) 98.1 ?F (36.7 ?C) 98.5 ?F (36.9 ?C)  ?TempSrc: Oral Oral Oral Oral  ?SpO2: 97% 97% 97% 97%  ?Weight:      ?Height:      ? ? ?Intake/Output Summary (Last 24 hours) at 06/29/2021 1551 ?Last data filed at 06/29/2021 1200 ?Gross per 24 hour  ?Intake 990 ml  ?Output 1100 ml  ?Net -110 ml  ? ?Filed Weights  ? 06/18/21 1200  ?Weight: 102.1 kg  ? ? ?Examination: ?Calm, NAD ?Cta no w/r ?Reg s1/s2 no gallop ?Soft benign +bs ?No edema right BKA ?Aaoxox3  ?Mood and affect appropriate in current setting  ? ? ?Data Reviewed: I have personally reviewed following labs and imaging  studies ? ?CBC: ?Recent Labs  ?Lab 06/23/21 ?0130 06/23/21 ?1013 06/24/21 ?0832 06/24/21 ?1645 06/25/21 ?4825 06/26/21 ?0037 06/29/21 ?0217  ?WBC 11.9*  --  10.1  --   --  12.5* 12.6*  ?NEUTROABS  --   --   --   --   --  10.5*  --   ?HGB 12.1*   < > 10.4* 10.3* 10.0* 9.8* 10.6*  ?HCT 35.6*   < > 31.5* 31.4* 31.0* 29.7* 32.8*  ?MCV 88.6  --  89.7  --   --  89.7 90.6  ?PLT 400  --  370  --   --  475* 669*  ? < > = values in this interval not displayed.  ? ?Basic Metabolic Panel: ?Recent Labs  ?Lab 06/23/21 ?0130 06/25/21 ?0488 06/26/21 ?0240 06/27/21 ?0107 06/27/21 ?0241 06/28/21 ?1447 06/29/21 ?0217  ?NA 135 131* 130* 132*  --  132* 130*  ?K 3.6 3.3* 4.2  --  4.2 4.4 4.4  ?CL 97* 93* 97*  --   --  95* 95*  ?CO2 '29 28 25  '$ --   --  27 26  ?GLUCOSE 154* 161* 150*  --   --  170* 143*  ?BUN 25* 17 14  --   --  16 15  ?CREATININE 0.89 0.73 0.71  --   --  0.93 0.76  ?CALCIUM 7.6* 7.6* 7.7*  --   --  8.4*  8.1*  ?MG  --   --   --   --  2.1 2.1  --   ? ?GFR: ?Estimated Creatinine Clearance: 103.2 mL/min (by C-G formula based on SCr of 0.76 mg/dL). ?Liver Function Tests: ?No results for input(s): AST, ALT, ALKPHOS, BILITOT, PROT, ALBUMIN in the last 168 hours. ?No results for input(s): LIPASE, AMYLASE in the last 168 hours. ?No results for input(s): AMMONIA in the last 168 hours. ?Coagulation Profile: ?No results for input(s): INR, PROTIME in the last 168 hours. ?Cardiac Enzymes: ?No results for input(s): CKTOTAL, CKMB, CKMBINDEX, TROPONINI in the last 168 hours. ?BNP (last 3 results) ?No results for input(s): PROBNP in the last 8760 hours. ?HbA1C: ?No results for input(s): HGBA1C in the last 72 hours. ?CBG: ?Recent Labs  ?Lab 06/28/21 ?1109 06/28/21 ?1558 06/28/21 ?2046 06/29/21 ?0258 06/29/21 ?1122  ?GLUCAP 188* 171* 131* 139* 172*  ? ?Lipid Profile: ?No results for input(s): CHOL, HDL, LDLCALC, TRIG, CHOLHDL, LDLDIRECT in the last 72 hours. ?Thyroid Function Tests: ?No results for input(s): TSH, T4TOTAL, FREET4, T3FREE,  THYROIDAB in the last 72 hours. ?Anemia Panel: ?No results for input(s): VITAMINB12, FOLATE, FERRITIN, TIBC, IRON, RETICCTPCT in the last 72 hours. ?Sepsis Labs: ?No results for input(s): PROCALCITON, LATICACIDVEN in the last 168 hours. ? ?Recent Results (from the past 240 hour(s))  ?Surgical pcr screen     Status: Abnormal  ? Collection Time: 06/21/21  8:35 PM  ? Specimen: Nasal Mucosa; Nasal Swab  ?Result Value Ref Range Status  ? MRSA, PCR NEGATIVE NEGATIVE Final  ? Staphylococcus aureus POSITIVE (A) NEGATIVE Final  ?  Comment: (NOTE) ?The Xpert SA Assay (FDA approved for NASAL specimens in patients 37 ?years of age and older), is one component of a comprehensive ?surveillance program. It is not intended to diagnose infection nor to ?guide or monitor treatment. ?Performed at [facility], [place] [address], [place], [place] ?[address] ?  ?  ? ? ? ? ? ?Radiology Studies: ?No results found. ? ? ? ? ? ?Scheduled Meds: ? acetaminophen  650 mg Oral Q6H  ? amLODipine  10 mg Oral Daily  ? aspirin EC  81 mg Oral Daily  ? Chlorhexidine Gluconate Cloth  6 each Topical Daily  ? docusate sodium  100 mg Oral BID  ? gabapentin  300 mg Oral TID  ? heparin injection (subcutaneous)  5,000 Units Subcutaneous Q8H  ? insulin aspart  0-5 Units Subcutaneous QHS  ? insulin aspart  0-9 Units Subcutaneous TID WC  ? irbesartan  300 mg Oral Daily  ? methocarbamol  500 mg Oral TID  ? metoprolol tartrate  25 mg Oral Q8H  ? multivitamin with minerals  1 tablet Oral Daily  ? pantoprazole  40 mg Oral BID  ? polyethylene glycol  17 g Oral BID  ? rosuvastatin  20 mg Oral Daily  ? senna  1 tablet Oral BID  ? sodium chloride flush  3 mL Intravenous Q12H  ? sodium chloride flush  3 mL Intravenous Q12H  ? terazosin  10 mg Oral QHS  ? ?Continuous Infusions: ? sodium chloride    ? sodium chloride    ? magnesium sulfate bolus IVPB    ? ? ?Assessment & Plan: ?  ?Principal Problem: ?  Critical limb ischemia of right lower extremity ([place]) ?Active  Problems: ?  Coffee ground emesis ?  Physical debility ?  CLL (chronic lymphocytic leukemia) ([place]) ?  Hypertension ?  Diabetes mellitus ([place]) ?  BPH (benign prostatic  hyperplasia) ?  Pacemaker ?  Constipation ?  Hypokalemia ?  Malnutrition of moderate degree ? ? ?* Critical limb ischemia of right lower extremity ([facility]) ?-Patient with h/o plantar fasciitis, last received injection for this on 2/17 ?-Within the next 1-2 weeks, he developed foot swelling, pain, discoloration ?-Upon arrival in the podiatry office, there was concern for limb ischemia and he was sent to the ER ?-He was started on heparin infusion ?-ABIs showed severe right lower extremity arterial disease, moderate left lower extremity arterial disease ?-Found to have critical right lower extremity ischemia.  Status post angiogram.  Vascular surgery did bypass, thrombectomy on 3/14. ?-There is a concern from vascular surgery that his bypass procedure has failed,so plan for rt bka ?3/18 dispo right BKA done on 3/17 by Dr. Vella Redhead ?3/19Pain better controlled ?Continue gabapentin ?dressing changed today, hemodynamically stable.   ?PT/OT rec. CIR ?F/u with vascular in 1 month for staple removal ?3/20 per vascular stump healing well appears viable.  Dry dressing with Ace until no SS drainage. ?3/21 per vascular once drainage stops he can use stump sock ?plan to go to CIR.  Continue as above management ?Heparin for dvt ppx ? ?  ?Coffee ground emesis ?Had significant coffee-ground emesis on 3/14.  There was about 800 cc of dark emesis collected in a can.  Interestingly, patient does not have any symptoms, no abdominal pain, nausea or vomiting.  Hemoglobin has dropped from baseline but has remained stable now.  Started on Protonix drip.   ?3/18 spoke to vascular PA no need for heparin drip  ?H&H remained stable.  Has not had any coffee-ground emesis except that 1 episode.   ?He does state he has never had EGD or colonoscopy. ?3/20 no further episodes.  ?H&H remained  stable ?We will need a GI referral on discharge  ? ? ? ?Urinary retention ?Failed void trial , will have to replace today 3/21. ?Can do another trial in near future ? ? ? ?NSVT ?Cards was consulted ?Keep K 4 or > ?Mg 2

## 2021-06-29 NOTE — Progress Notes (Addendum)
Inpatient Rehabilitation Admissions Coordinator  ? ?I met with patient at bedside. We discussed goals and expectations of a possible Cir admit. He prefers Cir. I will begin Auth with [employer] for possible CIR admit. I have notified acute therapy of need for updated OT notes. ? ?Danne Baxter, RN, MSN ?Rehab Admissions Coordinator ?[phone] ?06/29/2021 1:25 PM ? ?

## 2021-06-29 NOTE — Progress Notes (Addendum)
[facility] [place] ? ?Subjective  - Doing well over all no new complaints ? ? ?Objective ?(!) 142/72 ?98 ?98.4 ?F (36.9 ?C) (Oral) ?17 ?97% ? ?Intake/Output Summary (Last 24 hours) at 06/29/2021 0749 ?Last data filed at 06/29/2021 0455 ?Gross per 24 hour  ?Intake 550 ml  ?Output 1500 ml  ?Net -950 ml  ? ? ?Right BKA medial and anterior small bloody SS drainage.  No erythema or edema.  Suture line intact. ?Dry guaze with ace wrap re applied until no drainage then stump sock can be used. ?Lungs non labored breathing ? ? ?Assessment/Planning: ?POD # 4 Right BKA  ? ?Stump appears viable  ?Once drainage has stopped he can use the stump sock. ?Pending rehab ?Heparin for DVT prophylaxis  ? ? ?Douglas [name] ?06/29/2021 ?7:49 AM ?-- ? ?Laboratory ?Lab Results: ?No results for input(s): WBC, HGB, HCT, PLT in the last 72 hours. ?BMET ?Recent Labs  ?  06/28/21 ?1447 06/29/21 ?0217  ?NA 132* 130*  ?K 4.4 4.4  ?CL 95* 95*  ?CO2 27 26  ?GLUCOSE 170* 143*  ?BUN 16 15  ?CREATININE 0.93 0.76  ?CALCIUM 8.4* 8.1*  ? ? ?COAG ?Lab Results  ?Component Value Date  ? INR 1.0 06/18/2021  ? INR 1.0 01/16/2021  ? ?No results found for: PTT ? ? ?I have independently interviewed and examined patient and agree with PA assessment and plan above.  ? ?Cia Garretson C. Donzetta Matters, MD ?[facility] ?Office: [phone] ?Pager: [phone] ? ?

## 2021-06-29 NOTE — Progress Notes (Signed)
Occupational Therapy Treatment ?Patient Details ?Name: Douglas [name] ?MRN: [id number] ?DOB: 1950/12/04 ?Today's Date: 06/29/2021 ? ? ?History of present illness Patient is a 71 yo male admitted on 06/18/2021 with right foot swelling concerning for ischemic leg. Status post bypass and thrombectomy 06/22/21. 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 PMH: pacemaker, DM, HTN, and CLL. ?  ?OT comments ? Pt progressing towards OT goals, actively engaged in education, and hopeful for AIR level therapies to maximize independence. Pt able to demo bed mobility with Min A and progressing static sitting balance well EOB. Due to dynamic sitting balance deficits, extensive assist still required for LB ADLs. Pt with improving pain levels. However, while sitting EOB, pt with sudden R LE spasm and deferred further OOB attempts this PM. Educated re: limb positioning, limb protector purpose, core/LE exercises, progression of sitting ADLs to standing ADLs, and compensatory strategies for LB ADLs. Continue to feel pt is an excellent AIR candidate and will progress to independence well with intensive therapies.   ? ?Recommendations for follow up therapy are one component of a multi-disciplinary discharge planning process, led by the attending physician.  Recommendations may be updated based on patient status, additional functional criteria and insurance authorization. ?   ?Follow Up Recommendations ? Acute inpatient rehab (3hours/day)  ?  ?Assistance Recommended at Discharge Intermittent Supervision/Assistance  ?Patient can return home with the following ? Two people to help with walking and/or transfers;A lot of help with bathing/dressing/bathroom;Assist for transportation;Help with stairs or ramp for entrance;Assistance with cooking/housework ?  ?Equipment Recommendations ? Wheelchair (measurements OT);Wheelchair cushion (measurements OT);Other (comment) (Rolling walker; TBD)  ?  ?Recommendations for Other Services Rehab consult ? ?  ?Precautions / Restrictions  Precautions ?Precautions: Fall ?Precaution Comments: R BKA 3/17 ?Required Braces or Orthoses: Other Brace ?Other Brace: limb guard ?Restrictions ?Weight Bearing Restrictions: Yes ?RLE Weight Bearing: Non weight bearing  ? ? ?  ? ?Mobility Bed Mobility ?Overal bed mobility: Needs Assistance ?Bed Mobility: Supine to Sit, Sit to Supine ?  ?  ?Supine to sit: HOB elevated, Min assist ?Sit to supine: Min assist ?  ?General bed mobility comments: good effort on pt's part with Min A to lift trunk using bedrail. Min A to get L LE back into bed. pt able to use bedrails to push/pull self up in bed relatively well ?  ? ?Transfers ?  ?  ?  ?  ?  ?  ?  ?  ?  ?General transfer comment: declined d/t spasm pain in R LE ?  ?  ?Balance Overall balance assessment: Needs assistance ?Sitting-balance support: Feet supported, No upper extremity supported ?Sitting balance-Leahy Scale: Fair ?Sitting balance - Comments: bedrail and time to gain sitting balance EOB, able to statically sit but dynamic balance deficits evident ?  ?  ?  ?  ?  ?  ?  ?  ?  ?  ?  ?  ?  ?  ?  ?   ? ?ADL either performed or assessed with clinical judgement  ? ?ADL Overall ADL's : Needs assistance/impaired ?  ?  ?  ?  ?  ?  ?  ?  ?  ?  ?Lower Body Dressing: Maximal assistance;Sitting/lateral leans ?Lower Body Dressing Details (indicate cue type and reason): educa on lateral leans for LB ADLs while working to progress standing balance with dynamic sitting balance deficits evident ?  ?  ?  ?  ?  ?  ?  ?General ADL Comments: Focus on EOB balance for ADLs, exercises for  core/residual limb (emphasis on stretching to address spasm pain that limited pt today), fall prevention, safety strategies, phantom sensations,and progression of sitting balance to standing balance for ADLs/transfers ?  ? ?Extremity/Trunk Assessment Upper Extremity Assessment ?Upper Extremity Assessment: Overall WFL for tasks assessed (reports hx of "weird" shoulder position/ROM limitations but [facility]) ?   ?Lower Extremity Assessment ?Lower Extremity Assessment: Defer to PT evaluation ?  ?  ?  ? ?Vision   ?Vision Assessment?: No apparent visual deficits ?  ?Perception   ?  ?Praxis   ?  ? ?Cognition Arousal/Alertness: Awake/alert ?Behavior During Therapy: [facility] for tasks assessed/performed ?Overall Cognitive Status: Within Functional Limits for tasks assessed ?  ?  ?  ?  ?  ?  ?  ?  ?  ?  ?  ?  ?  ?  ?  ?  ?General Comments: Pt pleasant, WFL cognitively ?  ?  ?   ?Exercises Exercises: Other exercises ?Other Exercises ?Other Exercises: Knee extension/flexion ?Other Exercises: leg raises x 5 ?Other Exercises: pulling on B bedrails to engage core muscles ? ?  ?Shoulder Instructions   ? ? ?  ?General Comments    ? ? ?Pertinent Vitals/ Pain       Pain Assessment ?Pain Assessment: Faces ?Faces Pain Scale: Hurts little more ?Pain Location: spasm R LE ?Pain Descriptors / Indicators: Spasm ?Pain Intervention(s): Monitored during session, Limited activity within patient's tolerance ? ?Home Living   ?  ?  ?  ?  ?  ?  ?  ?  ?  ?  ?  ?  ?  ?  ?  ?  ?  ?  ? ?  ?Prior Functioning/Environment    ?  ?  ?  ?   ? ?Frequency ? Min 2X/week  ? ? ? ? ?  ?Progress Toward Goals ? ?OT Goals(current goals can now be found in the care plan section) ? Progress towards OT goals: Progressing toward goals ? ?Acute Rehab OT Goals ?Patient Stated Goal: pain control, go to rehab ?OT Goal Formulation: With patient ?Time For Goal Achievement: 07/07/21 ?Potential to Achieve Goals: Fair ?ADL Goals ?Pt Will Perform Grooming: standing;Independently ?Pt Will Perform Lower Body Bathing: sitting/lateral leans;with adaptive equipment;sit to/from stand;Independently ?Pt Will Perform Lower Body Dressing: Independently;with adaptive equipment;sitting/lateral leans;sit to/from stand ?Pt Will Transfer to Toilet: ambulating;with supervision  ?Plan Discharge plan remains appropriate   ? ?Co-evaluation ? ? ?   ?  ?  ?  ?  ? ?  ?AM-PAC OT "6 Clicks" Daily Activity      ?Outcome Measure ? ? Help from another person eating meals?: None ?Help from another person taking care of personal grooming?: A Little ?Help from another person toileting, which includes using toliet, bedpan, or urinal?: A Lot ?Help from another person bathing (including washing, rinsing, drying)?: A Lot ?Help from another person to put on and taking off regular upper body clothing?: A Little ?Help from another person to put on and taking off regular lower body clothing?: A Lot ?6 Click Score: 16 ? ?  ?End of Session   ? ?OT Visit Diagnosis: Unsteadiness on feet (R26.81);Other abnormalities of gait and mobility (R26.89);Muscle weakness (generalized) (M62.81);Pain ?Pain - Right/Left: Right ?Pain - part of body: Leg ?  ?Activity Tolerance Patient tolerated treatment well;Patient limited by pain ?  ?Patient Left in bed;with call bell/phone within reach;with bed alarm set ?  ?Nurse Communication Mobility status ?  ? ?   ? ?Time: 6010-9323 ?OT Time Calculation (  min): 24 min ? ?Charges: OT General Charges ?$OT Visit: 1 Visit ?OT Treatments ?$Self Care/Home Management : 8-22 mins ?$Therapeutic Activity: 8-22 mins ? ?Malachy Chamber, OTR/L ?Acute Rehab Services ?Office: [phone]  ? ?Layla Maw ?06/29/2021, 2:27 PM ?

## 2021-06-30 LAB — GLUCOSE, CAPILLARY
Glucose-Capillary: 126 mg/dL — ABNORMAL HIGH (ref 70–99)
Glucose-Capillary: 179 mg/dL — ABNORMAL HIGH (ref 70–99)
Glucose-Capillary: 150 mg/dL — ABNORMAL HIGH (ref 70–99)
Glucose-Capillary: 161 mg/dL — ABNORMAL HIGH (ref 70–99)

## 2021-06-30 NOTE — Progress Notes (Addendum)
[facility] [place] ? ?Subjective  - no new complaints waiting on rehab. ? ? ?Objective ?114/62 ?78 ?98 ?F (36.7 ?C) (Oral) ?17 ?96% ? ?Intake/Output Summary (Last 24 hours) at 06/30/2021 0741 ?Last data filed at 06/30/2021 1517 ?Gross per 24 hour  ?Intake 990 ml  ?Output 2400 ml  ?Net -1410 ml  ? ? ?Right BKA dressing changed, minimal drainage on ABD. ? ? ? ?Ecchymosis, no ischemic skin changes. ? ?Assessment/Planning: ?Right BKA day # 5 ? ?Right BKA appears viable ?Pending no drainage he has a stump sock for compression in his room.   ?Pending Rehab and insurance. ?Stable from a vascular point of view.  ? ?Douglas [name] ?06/30/2021 ?7:41 AM ?-- ? ?Laboratory ?Lab Results: ?Recent Labs  ?  06/29/21 ?0217  ?WBC 12.6*  ?HGB 10.6*  ?HCT 32.8*  ?PLT 669*  ? ?BMET ?Recent Labs  ?  06/28/21 ?1447 06/29/21 ?0217  ?NA 132* 130*  ?K 4.4 4.4  ?CL 95* 95*  ?CO2 27 26  ?GLUCOSE 170* 143*  ?BUN 16 15  ?CREATININE 0.93 0.76  ?CALCIUM 8.4* 8.1*  ? ? ?COAG ?Lab Results  ?Component Value Date  ? INR 1.0 06/18/2021  ? INR 1.0 01/16/2021  ? ?No results found for: PTT ? ?I have independently interviewed and examined patient and agree with PA assessment and plan above. Pending CIR. ? ?Douglas [name] C. Donzetta Matters, MD ?[facility] ?Office: [phone] ?Pager: [phone] ? ? ?

## 2021-06-30 NOTE — Progress Notes (Signed)
[facility] [place] ? ? ? ? ? ? ? ? ? ?Indication:  71 year old male presented with right foot pain for 2 weeks.  He also has 2 to 3 months of claudication now with discoloration of the right foot and severely diminished ABIs on the right with mildly depressed ABIs on the left.  On 06/22/21 he underwent Right common femoral to below-knee popliteal artery bypass with nonreversed ipsilateral translocated greater saphenous vein.  The bypass failed and he was taken back to the OR and underwent below knee amputation.  The amputation appears viable.  We are pending rehab placement verse home with home health.   ?  ? ? ?Roxy Horseman ?PA-C ? ?06/30/21 ? ? ?

## 2021-06-30 NOTE — PMR Pre-admission (Signed)
PMR Admission Coordinator Pre-Admission Assessment ? ?Patient: Douglas [name] is an 71 y.o., male ?MRN: [id number] ?DOB: 1950/11/12 ?Height: 6' (182.9 cm) ?Weight: 102.1 kg ? ?Insurance Information ?HMO:     PPO:      PCP:      IPA:      80/20:      OTHER:  ?PRIMARY: [employer] commercial      Policy#: [id number]      Subscriber: spouse ?CM Name: approved via notification of approval in portal and verified by calling [phone] with [facility]  reference # of call [id number]       Phone#: [phone]     Fax#: [phone] ?Pre-Cert#: [id number]  approved for 7 days    Employer: [employer] ?Benefits:  Phone #: [phone]     Name: 3/21 ?Eff. Date: 04/11/2021     Deduct: $3000      Out of Pocket Max: $4500      Life Max: none ?CIR: 80%      SNF: 80% 120 days ?Outpatient: 80%     Co-Pay:  ?Home Health: 80%      Co-Pay: 60 visits combined ?DME: 80%     Co-Pay: 20% ?Providers: in network ? ?SECONDARY: Medicare part A only      Policy#: [id number] ? ?Financial Counselor:       Phone#:  ? ?The ?Data Collection Information Summary? for patients in Inpatient Rehabilitation Facilities with attached ?Privacy Act [place] Records? was provided and verbally reviewed with: Patient ? ?Emergency Contact Information ?Contact Information   ? ? Name Relation Home Work Mobile  ? [place]  [phone]  ? ?  ? ?Current Medical History  ?Patient Admitting Diagnosis: BKA ? ?History of Present Illness:  71 year old right-handed male with history of BPH, hypertension, CAD with pacemaker 01/19/2021 per Dr. Osie Cheeks, history of tobacco use, CLL diagnosed 11/2018 followed by Dr. Alen Blew.  Presented 06/18/2021 with right foot swelling ischemic change as well as rest pain.  Patient had recently been treated for plantar fasciitis and metatarsal condition with podiatry 2 weeks ago.  Vascular surgery consulted underwent angiogram that showed the aorta and iliac segments free of  flow-limiting stenosis.  Bilateral renal arteries patent.  Right lower extremity SFA occludes at the takeoff.  He reconstitutes above the knee where it is diseased and below the knee noted to be healthier.  Patient underwent right common femoral to below-knee popliteal artery bypass with non reversed ipsilateral translocated greater saphenous vein with right posterior tibial ankle exposure and thrombectomy 06/22/2021 per Dr. Donzetta Matters.  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 Donzetta Matters.  Nonweightbearing right lower extremity.  Placed on subcutaneous heparin for DVT prophylaxis.  Developed episode of NSVT postoperatively cardiology service was consulted with echocardiogram completed showing ejection fraction of 50 to 55% the left ventricle demonstrated regional wall motion abnormality.  He was placed on Lopressor twice daily.  Acute blood loss anemia 10.6 and monitored.  Patient did have an episode of coffee-ground emesis 3/14 and placed on Protonix twice daily.  He did have bouts of urinary retention requiring a Foley catheter tube with planned voiding trial.  Patient was asymptomatic and close monitoring of hemoglobin/hematocrit.  ? ?Patient's medical record from [facility] has been reviewed by the rehabilitation admission coordinator and physician. ? ?Past Medical History  ?Past Medical History:  ?Diagnosis Date  ? BPH (benign prostatic hyperplasia)   ?  CLL (chronic lymphocytic leukemia) ([place])   ? Hypertension   ? Pacemaker   ? Medtronic Device  ? ?Has the patient had major surgery during 100 days prior to admission? Yes ? ?Family History   ?family history includes Coronary artery disease in his father; Lung cancer in his mother. ? ?Current Medications ? ?Current Facility-Administered Medications:  ?  0.9 %  sodium chloride infusion, 250 mL, Intravenous, PRN, Baglia, Corrina, PA-C ?  0.9 %  sodium chloride infusion, 500 mL,  Intravenous, Once PRN, Baglia, Corrina, PA-C ?  acetaminophen (TYLENOL) tablet 650 mg, 650 mg, Oral, Q6H, Cherre Robins, MD, 650 mg at 07/01/21 1126 ?  albuterol (PROVENTIL) (2.5 MG/3ML) 0.083% nebulizer solution 3 mL, 3 mL, Inhalation, Q6H PRN, Baglia, Corrina, PA-C ?  alum & mag hydroxide-simeth (MAALOX/MYLANTA) 200-200-20 MG/5ML suspension 30 mL, 30 mL, Oral, Q6H PRN, Baglia, Corrina, PA-C, 30 mL at 06/21/21 1636 ?  amLODipine (NORVASC) tablet 10 mg, 10 mg, Oral, Daily, Nolberto Hanlon, MD, 10 mg at 07/01/21 0254 ?  aspirin EC tablet 81 mg, 81 mg, Oral, Daily, Baglia, Corrina, PA-C, 81 mg at 07/01/21 0916 ?  bisacodyl (DULCOLAX) EC tablet 5 mg, 5 mg, Oral, Daily PRN, Baglia, Corrina, PA-C, 5 mg at 06/28/21 1051 ?  Chlorhexidine Gluconate Cloth 2 % PADS 6 each, 6 each, Topical, Daily, Nolberto Hanlon, MD, 6 each at 07/01/21 1046 ?  docusate sodium (COLACE) capsule 100 mg, 100 mg, Oral, BID, Baglia, Corrina, PA-C, 100 mg at 07/01/21 2706 ?  gabapentin (NEURONTIN) capsule 300 mg, 300 mg, Oral, TID, Nolberto Hanlon, MD, 300 mg at 07/01/21 0915 ?  guaiFENesin-dextromethorphan (ROBITUSSIN DM) 100-10 MG/5ML syrup 15 mL, 15 mL, Oral, Q4H PRN, Baglia, Corrina, PA-C ?  heparin injection 5,000 Units, 5,000 Units, Subcutaneous, Q8H, Baglia, Corrina, PA-C, 5,000 Units at 07/01/21 1353 ?  hydrALAZINE (APRESOLINE) injection 5 mg, 5 mg, Intravenous, Q4H PRN, Baglia, Corrina, PA-C ?  hydrALAZINE (APRESOLINE) injection 5 mg, 5 mg, Intravenous, Q20 Min PRN, Baglia, Corrina, PA-C ?  insulin aspart (novoLOG) injection 0-5 Units, 0-5 Units, Subcutaneous, QHS, Baglia, Corrina, PA-C ?  insulin aspart (novoLOG) injection 0-9 Units, 0-9 Units, Subcutaneous, TID WC, Baglia, Corrina, PA-C, 1 Units at 07/01/21 1128 ?  irbesartan (AVAPRO) tablet 300 mg, 300 mg, Oral, Daily, Baglia, Corrina, PA-C, 300 mg at 07/01/21 2376 ?  labetalol (NORMODYNE) injection 10 mg, 10 mg, Intravenous, Q10 min PRN, Baglia, Corrina, PA-C ?  magnesium sulfate IVPB 2 g 50  mL, 2 g, Intravenous, Daily PRN, Baglia, Corrina, PA-C ?  methocarbamol (ROBAXIN) tablet 500 mg, 500 mg, Oral, TID, Cherre Robins, MD, 500 mg at 07/01/21 2831 ?  metoprolol tartrate (LOPRESSOR) injection 2-5 mg, 2-5 mg, Intravenous, Q2H PRN, Baglia, Corrina, PA-C ?  metoprolol tartrate (LOPRESSOR) tablet 25 mg, 25 mg, Oral, Q8H, Shirley Friar, PA-C, 25 mg at 07/01/21 1353 ?  montelukast (SINGULAIR) tablet 10 mg, 10 mg, Oral, Daily PRN, Baglia, Corrina, PA-C ?  morphine (PF) 2 MG/ML injection 2 mg, 2 mg, Intravenous, Q2H PRN, Baglia, Corrina, PA-C, 2 mg at 06/30/21 1930 ?  multivitamin with minerals tablet 1 tablet, 1 tablet, Oral, Daily, Baglia, Corrina, PA-C, 1 tablet at 07/01/21 0916 ?  ondansetron (ZOFRAN) tablet 4 mg, 4 mg, Oral, Q6H PRN, 4 mg at 06/19/21 0800 **OR** ondansetron (ZOFRAN) injection 4 mg, 4 mg, Intravenous, Q6H PRN, Baglia, Corrina, PA-C, 4 mg at 06/21/21 2354 ?  ondansetron (ZOFRAN) injection 4 mg, 4 mg, Intravenous, Q6H PRN, Baglia, Corrina, PA-C, 4 mg at  06/25/21 1857 ?  oxyCODONE (Oxy IR/ROXICODONE) immediate release tablet 5 mg, 5 mg, Oral, Q4H PRN, Baglia, Corrina, PA-C, 5 mg at 07/01/21 1126 ?  pantoprazole (PROTONIX) EC tablet 40 mg, 40 mg, Oral, BID, Baglia, Corrina, PA-C, 40 mg at 07/01/21 0916 ?  phenol (CHLORASEPTIC) mouth spray 1 spray, 1 spray, Mouth/Throat, PRN, Baglia, Corrina, PA-C ?  polyethylene glycol (MIRALAX / GLYCOLAX) packet 17 g, 17 g, Oral, BID, Nolberto Hanlon, MD, 17 g at 07/01/21 0915 ?  prochlorperazine (COMPAZINE) injection 10 mg, 10 mg, Intravenous, Q6H PRN, Baglia, Corrina, PA-C, 10 mg at 06/22/21 0442 ?  rosuvastatin (CRESTOR) tablet 20 mg, 20 mg, Oral, Daily, Baglia, Corrina, PA-C, 20 mg at 07/01/21 0917 ?  senna (SENOKOT) tablet 8.6 mg, 1 tablet, Oral, BID, Baglia, Corrina, PA-C, 8.6 mg at 07/01/21 0915 ?  sodium chloride flush (NS) 0.9 % injection 3 mL, 3 mL, Intravenous, Q12H, Baglia, Corrina, PA-C, 3 mL at 07/01/21 0918 ?  sodium chloride flush (NS)  0.9 % injection 3 mL, 3 mL, Intravenous, Q12H, Baglia, Corrina, PA-C, 3 mL at 07/01/21 0918 ?  sodium chloride flush (NS) 0.9 % injection 3 mL, 3 mL, Intravenous, PRN, Baglia, Corrina, PA-C ?  sodium phosphate

## 2021-06-30 NOTE — Progress Notes (Signed)
Inpatient Rehabilitation Admissions Coordinator  ? ?I await insurance approval for possible CIR admit. ? ?Danne Baxter, RN, MSN ?Rehab Admissions Coordinator ?([phone] ?06/30/2021 10:57 AM ? ?

## 2021-06-30 NOTE — H&P (Incomplete)
? ? ?Physical Medicine and Rehabilitation Admission H&P ? ?  ?Chief Complaint  ?Patient presents with  ? Foot Swelling  ?: ?HPI: Douglas [name] is a 71 year old right-handed male with history of BPH, hypertension, CAD with pacemaker 01/19/2021 per Dr. Osie Cheeks, history of tobacco use, CLL diagnosed 11/2018 followed by Dr. Alen Blew.  Per chart review patient lives with spouse.  Two-level home 3 steps to entry.  Independent driving prior to admission.  Wife works from home.  Presented 06/18/2021 with right foot swelling ischemic change as well as rest pain.  Patient had recently been treated for plantar fasciitis and metatarsal condition with podiatry 2 weeks ago.  Vascular surgery consulted underwent angiogram that showed the aorta and iliac segments free of flow-limiting stenosis.  Bilateral renal arteries patent.  Right lower extremity SFA occludes at the takeoff.  He reconstitutes above the knee where it is diseased and below the knee noted to be healthier.  Patient underwent right common femoral to below-knee popliteal artery bypass with nonreversed ipsilateral translocated greater saphenous vein with right posterior tibial ankle exposure and thrombectomy 06/22/2021 per Dr. Donzetta Matters.  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 Donzetta Matters.  Nonweightbearing right lower extremity.  Placed on subcutaneous heparin for DVT prophylaxis.  Developed episode of NSVT postoperatively cardiology service was consulted with echocardiogram completed showing ejection fraction of 50 to 55% the left ventricle demonstrated regional wall motion abnormality.  He was placed on Lopressor twice daily.  Acute blood loss anemia 10.6 and monitored.  Patient did have an episode of coffee-ground emesis 3/14 and placed on Protonix twice daily.  He did have bouts of urinary retention requiring a Foley catheter tube with planned voiding trial.  Patient was  asymptomatic and close monitoring of hemoglobin/hematocrit.  Therapy evaluations completed due to patient decreased functional mobility was admitted for a comprehensive rehab program. ? ?Review of Systems  ?Constitutional:  Negative for chills and fever.  ?HENT:  Negative for hearing loss.   ?Eyes:  Negative for blurred vision and double vision.  ?Respiratory:  Negative for cough and shortness of breath.   ?Cardiovascular:  Positive for palpitations and leg swelling. Negative for chest pain.  ?Gastrointestinal:  Positive for constipation. Negative for heartburn, nausea and vomiting.  ?Genitourinary:  Positive for urgency. Negative for dysuria, flank pain and hematuria.  ?Musculoskeletal:  Positive for joint pain and myalgias.  ?Skin:  Negative for rash.  ?All other systems reviewed and are negative. ?Past Medical History:  ?Diagnosis Date  ? BPH (benign prostatic hyperplasia)   ? CLL (chronic lymphocytic leukemia) ([place])   ? Hypertension   ? Pacemaker   ? [employer] Device  ? ?Past Surgical History:  ?Procedure Laterality Date  ? ABDOMINAL AORTOGRAM W/LOWER EXTREMITY Bilateral 06/21/2021  ? Procedure: ABDOMINAL AORTOGRAM W/LOWER EXTREMITY;  Surgeon: Waynetta Sandy, MD;  Location: [facility];  Service: Cardiovascular;  Laterality: Bilateral;  ? AMPUTATION Right 06/25/2021  ? Procedure: RIGHT BELOW KNEE AMPUTATION;  Surgeon: Waynetta Sandy, MD;  Location: [facility];  Service: Vascular;  Laterality: Right;  ? FEMORAL-POPLITEAL BYPASS GRAFT Right 06/22/2021  ? Procedure: RIGHT FEMORAL-POPLITEAL BYPASS WITH VEIN, RIGHT POPLITEAL THROMBECTOMY;  Surgeon: Waynetta Sandy, MD;  Location: [facility];  Service: Vascular;  Laterality: Right;  ? LEFT HEART CATH AND CORONARY ANGIOGRAPHY N/A 01/18/2021  ? Procedure: LEFT HEART CATH AND CORONARY ANGIOGRAPHY;  Surgeon: Burnell Blanks, MD;  Location: [facility];  Service: Cardiovascular;  Laterality: N/A;  ? PACEMAKER IMPLANT N/A 01/19/2021   ? Procedure: PACEMAKER IMPLANT;  Surgeon: Evans Lance, MD;  Location: [facility];  Service: Cardiovascular;  Laterality: N/A;  ? ?Family History  ?Problem Relation Age of Onset  ? Lung cancer Mother   ? Coronary artery disease Father   ? ?Social History:  reports that he has quit smoking. His smoking use included cigarettes. He has a 5.00 pack-year smoking history. He has never used smokeless tobacco. He reports that he does not currently use alcohol. He reports that he does not use drugs. ?Allergies: Not on File ?Medications Prior to Admission  ?Medication Sig Dispense Refill  ? albuterol (PROVENTIL HFA;VENTOLIN HFA) 108 (90 BASE) MCG/ACT inhaler Inhale 2 puffs into the lungs 4 (four) times daily. (Patient taking differently: Inhale 2 puffs into the lungs every 6 (six) hours as needed for shortness of breath or wheezing.) 1 Inhaler 0  ? amLODipine (NORVASC) 5 MG tablet Take 5 mg by mouth daily.    ? IMBRUVICA 420 MG tablet TAKE 1 TABLET BY MOUTH ONCE  DAILY WITH A FULL GLASS OF WATER (Patient taking differently: 420 mg daily. WITH A FULL GLASS OF  WATER) 28 tablet 1  ? Lidocaine 4 % LOTN Apply 1 application. topically daily as needed (pain).    ? montelukast (SINGULAIR) 10 MG tablet Take 10 mg by mouth daily as needed (allergies).    ? oxymetazoline (AFRIN) 0.05 % nasal spray Place 1 spray into both nostrils 2 (two) times daily as needed for congestion.    ? terazosin (HYTRIN) 10 MG capsule Take 10 mg by mouth at bedtime.    ? valsartan-hydrochlorothiazide (DIOVAN-HCT) 320-25 MG tablet Take 1 tablet by mouth daily.    ? ? ? ? ?Home: ?Home Living ?Family/patient expects to be discharged to:: Private residence ?Living Arrangements: Spouse/significant other ?Available Help at Discharge: Family, Available 24 hours/day (wife works for [employer] remotely from home) ?Type of Home: House ?Home Access: Stairs to enter ?Entrance Stairs-Number of Steps: 3 ?Entrance Stairs-Rails: Left ?Home Layout: 1/2 bath on main  level, Bed/bath upstairs, Two level (home to go on market with plans to move to a one level apartment May 2023) ?Alternate Level Stairs-Number of Steps: 15 ?Alternate Level Stairs-Rails: Can reach both ?Bathroom Shower/Tub: Walk-in shower (upstairs; 1/2 bath down stairs) ?Bathroom Toilet: Standard ?Bathroom Accessibility: Yes ?Home Equipment: None ?Additional Comments: Was independent and still driving, retired ? Lives With: Spouse ?  ?Functional History: ?Prior Function ?Prior Level of Function : Independent/Modified Independent ?Mobility Comments: Independent and driving ?ADLs Comments: Independent ? ?Functional Status:  ?Mobility: ?Bed Mobility ?Overal bed mobility: Needs Assistance ?Bed Mobility: Supine to Sit ?Rolling: Min assist ?Sidelying to sit: Min assist ?Supine to sit: HOB elevated, Min assist, +2 for safety/equipment ?Sit to supine: Min assist ?Sit to sidelying: Min assist ?General bed mobility comments: good effort on pt's part with Min A to lift trunk using bedrail. pt able to use bedrails to push/pull self up in bed relatively well, cues for improved technique. close to min guard ?Transfers ?Overall transfer level: Needs assistance ?Equipment used: Ambulation equipment used, Rolling walker (2 wheels) ?Transfers: Sit to/from Stand, Bed to chair/wheelchair/BSC ?Sit to Stand: Min assist, +2 safety/equipment ?Bed to/from chair/wheelchair/BSC transfer type:: Via Lift equipment ?Transfer via Lift Equipment: Stedy ?General transfer comment: Pt actually able to pull self up in [place] (without elevated bed) with Min A at most (tech present for safety) and transferred to recliner.  Pt reports pain in B hands with RW attempts and declined to trial this; also due to nausea ?Ambulation/Gait ?General Gait Details: deferred due to pt fatigue after recent stand pivot to/from [facility] with nursing staff ?Pre-gait activities: attempting to have pt scoot L heel back toward bed frame but pt unable to deweight LLE at this time  using RW ?  ? ?ADL: ?ADL ?Overall ADL's : Needs assistance/impaired ?Eating/Feeding: Set up, Bed level ?Grooming: Set up, Sitting ?Upper Body Bathing: Set up, Sitting ?Lower Body Bathing: Maximal assistance, [place]

## 2021-06-30 NOTE — Progress Notes (Signed)
Physical Therapy Treatment ?Patient Details ?Name: Douglas [name] ?MRN: [id number] ?DOB: [birth year] ?Today's Date: 06/30/2021 ? ? ?History of Present Illness Patient is a 71 yo male admitted on 06/18/2021 with right foot swelling concerning for ischemic leg. Status post bypass and thrombectomy 06/22/21. 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 PMH: pacemaker, DM, HTN, and CLL. ? ?  ?PT Comments  ? ? Pt received in supine, agreeable to therapy session and with good participation and tolerance for transfer training using RW and Stedy and supine/seated exercises. Pt anxious regarding pain prior to mobility, needed some encouragement but with improved participation after discussion on gradual progression of mobility within tolerance and importance of repositioning for improved pulmonary clearance/pressure relief. Pt continues to benefit from PT services to progress toward functional mobility goals. Pt only wanting to sit up <1 hour, RN notified to use +2 assist with [facility] for return transfer safety and also still has IV access in B hands, which is causing difficulty for seated scooting along EOB, pt no longer needing IV pain meds in past 24 hours.   ?Recommendations for follow up therapy are one component of a multi-disciplinary discharge planning process, led by the attending physician.  Recommendations may be updated based on patient status, additional functional criteria and insurance authorization. ? ?Follow Up Recommendations ? Acute inpatient rehab (3hours/day) ?  ?  ?Assistance Recommended at Discharge Frequent or constant Supervision/Assistance  ?Patient can return home with the following Two people to help with walking and/or transfers;A lot of help with bathing/dressing/bathroom;Assistance with cooking/housework;Assist for transportation;Help with stairs or ramp for entrance ?  ?Equipment Recommendations ? Rolling walker (2 wheels);BSC/3in1;Wheelchair (measurements PT);Wheelchair cushion (measurements PT);Hospital bed (may change with  progression; may need slide board)  ?  ?Recommendations for Other Services   ? ? ?  ?Precautions / Restrictions Precautions ?Precautions: Fall ?Precaution Comments: R BKA 3/17 ?Required Braces or Orthoses: Other Brace ?Other Brace: limb guard ?Restrictions ?Weight Bearing Restrictions: Yes ?RLE Weight Bearing: Non weight bearing  ?  ? ?Mobility ? Bed Mobility ?Overal bed mobility: Needs Assistance ?Bed Mobility: Supine to Sit ?  ?  ?Supine to sit: HOB elevated, Min assist, +2 for safety/equipment ?  ?  ?General bed mobility comments: good effort on pt's part with Min A to lift trunk using bedrail. pt able to use bedrails to push/pull self up in bed relatively well, cues for improved technique ?  ? ?Transfers ?Overall transfer level: Needs assistance ?Equipment used: Ambulation equipment used, Rolling walker (2 wheels) ?Transfers: Sit to/from Stand, Bed to chair/wheelchair/BSC ?Sit to Stand: Mod assist, +2 physical assistance, +2 safety/equipment, From elevated surface ?  ?  ?  ?  ?  ?General transfer comment: +2 modA to stand from elevated bed>RW with +2 modA and for steadying, then to [facility] with +1 modA (second person min guard for safety) and +2 modA for stand>sit to lower recliner surface. ?Transfer via Lift Equipment: Stedy ? ?Ambulation/Gait ?  ?  ?  ?  ?  ?  ?Pre-gait activities: attempting to have pt scoot L heel back toward bed frame but pt unable to deweight LLE at this time using RW ?  ? ? ?Stairs ?  ?  ?  ?  ?  ? ? ?Wheelchair Mobility ?  ? ?Modified Rankin (Stroke Patients Only) ?  ? ? ?  ?Balance Overall balance assessment: Needs assistance ?Sitting-balance support: Feet supported, No upper extremity supported ?Sitting balance-Leahy Scale: Poor (dynamic balance poor) ?Sitting balance - Comments: min guard for safety, x1 episode of  LOB with dynamic task needing min/modA to recover trunk balance ?  ?Standing balance support: Reliant on assistive device for balance ?Standing balance-Leahy Scale:  Poor ?Standing balance comment: modA +2 for static standing at RW, +1 assist for static standing in [place] (2nd person for safety with [facility]). +2 assist for dynamic standing tasks ?  ?  ?  ?  ?  ?  ?  ?  ?  ?  ?  ?  ? ?  ?Cognition Arousal/Alertness: Awake/alert ?Behavior During Therapy: [facility] for tasks assessed/performed ?Overall Cognitive Status: Within Functional Limits for tasks assessed ?  ?  ?  ?  ?  ?  ?  ?  ?  ?  ?  ?  ?  ?  ?  ?  ?General Comments: Pt pleasant, WFL cognitively, at times self-limiting due to fear of pain but able to overcome objections with discussion on benefits of mobility progression ?  ?  ? ?  ?Exercises Other Exercises ?Other Exercises: supine RLE: quad sets, SLR x3 reps ?Other Exercises: STS x 3 trials, static standing ~1 minute at a time for BLE strengthening ?Other Exercises: lateral leans with elbow taps x2 reps ea ? ?  ?General Comments General comments (skin integrity, edema, etc.): VSS on RA, pt denies dizziness but quick to fatigue on LLE ?  ?  ? ?Pertinent Vitals/Pain Pain Assessment ?Pain Assessment: Faces ?Faces Pain Scale: Hurts even more ?Pain Location: RLE and B hands from IV on back of hands with scooting ?Pain Descriptors / Indicators: Discomfort, Grimacing, Guarding ?Pain Intervention(s): Limited activity within patient's tolerance, Monitored during session, Repositioned, Patient requesting pain meds-RN notified  ? ? ? ?PT Goals (current goals can now be found in the care plan section) Acute Rehab PT Goals ?Patient Stated Goal: less pain, to get into rehab and get stronger prior to going home ?PT Goal Formulation: With patient ?Time For Goal Achievement: 07/10/21 ?Progress towards PT goals: Progressing toward goals ? ?  ?Frequency ? ? ? Min 3X/week ? ? ? ?  ?PT Plan Current plan remains appropriate  ? ? ?   ?AM-PAC PT "6 Clicks" Mobility   ?Outcome Measure ? Help needed turning from your back to your side while in a flat bed without using bedrails?: A Little ?Help needed  moving from lying on your back to sitting on the side of a flat bed without using bedrails?: A Little ?Help needed moving to and from a bed to a chair (including a wheelchair)?: A Lot ?Help needed standing up from a chair using your arms (e.g., wheelchair or bedside chair)?: A Lot ?Help needed to walk in hospital room?: Total ?Help needed climbing 3-5 steps with a railing? : Total ?6 Click Score: 12 ? ?  ?End of Session Equipment Utilized During Treatment: Gait belt;Other (comment) (limb guard to RLE) ?Activity Tolerance: Patient tolerated treatment well ?Patient left: in chair;with call bell/phone within reach;with chair alarm set ?Nurse Communication: Mobility status;Need for lift equipment;Patient requests pain meds;Other (comment) (+2 assist, pt does not want to sit >1 hour) ?PT Visit Diagnosis: Pain;Difficulty in walking, not elsewhere classified (R26.2);Unsteadiness on feet (R26.81);Muscle weakness (generalized) (M62.81) ?Pain - Right/Left: Right ?Pain - part of body: Leg ?  ? ? ?Time: 0071-2197 ?PT Time Calculation (min) (ACUTE ONLY): 23 min ? ?Charges:  $Therapeutic Exercise: 8-22 mins ?$Therapeutic Activity: 8-22 mins          ?          ? ?Casy Brunetto P., PTA ?Acute Rehabilitation  Services ?Pager: [phone] ?Office: [phone]  ? ? ?Kara Pacer Cadon Raczka ?06/30/2021, 5:55 PM ? ?

## 2021-06-30 NOTE — Progress Notes (Signed)
Mobility Specialist Progress Note ? ? 06/30/21 1900  ?Mobility  ?Activity Transferred from chair to bed  ?Level of Assistance +2 (takes two people)  ?Assistive Device Stedy  ?RLE Weight Bearing NWB  ?Activity Response Tolerated well  ?$Mobility charge 1 Mobility  ? ?Pt in chair requesting to go back to bed and having no complaints. Requiring minA +2 to stand from chair to stedy. Tolerating and following VC well. Left in bed w/ call bell by side and RN in room. ? ?Holland Falling ?Mobility Specialist ?Phone Number [phone] ? ?

## 2021-06-30 NOTE — Progress Notes (Signed)
?PROGRESS NOTE ? ? ? ?Douglas [name]  UMP:[id number] DOB: 1950-10-13 DOA: 06/18/2021 ?PCP: [name], MD ? ? ?Brief Narrative:  ?Patient is a 71 year old male with history of BPH, CLL, hypertension who presented with right foot swelling, purple discoloration, pain, inability to ambulate.  On presentation he was found to have ischemic right lower extremity, started on heparin drip.  Vascular surgery consulted.  Found to have critical right lower extremity ischemia, status post right  lower extremity angiogram.  Vascular surgery did  bypass surgery with thrombectomy.  Hospital course also remarkable for coffee-ground emesis with a stable hemoglobin.  The limb turned out to be  unsalvageable so vascular surgery planning for right BKA . Now s/p Rt BKA ?  ?3/20 night MD reported NSVT. Cardiology consulted. Foley DC'd ?3/21 Post void urinary retention. Replace foley. Bladder scan 817 as pt had not urinated all day. +bm yesterday, none so far ? ? ?Assessment & Plan: ?  ?Principal Problem: ?  Critical limb ischemia of right lower extremity ([place]) ?Active Problems: ?  Coffee ground emesis ?  Physical debility ?  CLL (chronic lymphocytic leukemia) ([place]) ?  Hypertension ?  Diabetes mellitus ([place]) ?  BPH (benign prostatic hyperplasia) ?  Pacemaker ?  Constipation ?  Hypokalemia ?  Malnutrition of moderate degree ? ?Critical limb ischemia of right lower extremity ([place]) ?-ABIs showed severe right lower extremity arterial disease, found to have critical right lower extremity ischemia.  ?-Vascular surgery following, status post bypass/thrombectomy on 3/14. 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 CIR ?F/u with vascular in 1 month for staple removal ?  ?Coffee ground emesis ?Had significant coffee-ground emesis on 3/14.  ?He does state he has never had EGD or colonoscopy. ?3/20 no further episodes.  ?H&H remained stable ?We will need a GI referral on discharge  ?  ?Urinary retention ?Foley removed  3/20 with failure of voiding trial, replaced 3/21  ?  ?NSVT ?Cards consulted ?Lopressor '25mg'$  bid added, titrate as tolerated  ?Echo with nml EF. Has non obstructive CAD cath 10/22. ?  ?Physical debility ?Therapy recommended CIR, authorization pending ?  ?Hypokalemia ?Replaced and stable ?  ?Hyponatremia ?More chronic. ?We will hold HCTZ ?  ?CLL (chronic lymphocytic leukemia) ([place]) ?-Diagnosed in 11/2018.   ?-Has done well with ibrutinib, and is continuing this medication. ?-Followed by Dr. [name]. ?  ?Constipation ?Continue bowel regimen ?  ?Pacemaker ?-High grade heart block ?-Pacer in place and working well in January per [name] ?-[name] was performed in 01/2021 pre-procedure with no obvious obstructive CAD ?  ?BPH (benign prostatic hyperplasia) ?-continue terazosin. Has intermittent issues of urinary retention ?  ?Diabetes mellitus ([place]) ?-Last A1c was 6.7 ?-He is not on medications at home ?-Continue with sensitive-scale SSI ?  ?Hypertension ?-Continue ARB, amlodipine, and terazosin ?Discontinue HCTZ due to hyponatremia ?Increase amlodipine to 10 mg daily ?  ?  ?DVT prophylaxis: Heparin subcu ?Code Status: Full ?Family Communication: None at bedside ? ?Status is: Inpatient ? ?Dispo: The patient is from: Home ?             Anticipated d/c is to: CIR ?             Anticipated d/c date is: 24 to 48 hours ?             Patient currently is medically stable for discharge ? ?Consultants:  ?Vascular ? ?Procedures:  ?Right BKA ? ?Antimicrobials:  ?None ? ?Subjective: ?No acute issues or events overnight,  pain well controlled ? ?Objective: ?Vitals:  ? 06/29/21 2051 06/29/21 2327 06/30/21 0438 06/30/21 0700  ?BP: (!) 141/67 (!) 122/57 137/70 114/62  ?Pulse: 86 78  78  ?Resp: '15 18 16 17  '$ ?Temp: 98 ?F (36.7 ?C) 98.2 ?F (36.8 ?C) 98.1 ?F (36.7 ?C) 98 ?F (36.7 ?C)  ?TempSrc: Oral Oral Oral Oral  ?SpO2: 97% 98% 98% 96%  ?Weight:      ?Height:      ? ? ?Intake/Output Summary (Last 24 hours) at 06/30/2021 0816 ?Last data filed  at 06/30/2021 0932 ?Gross per 24 hour  ?Intake 770 ml  ?Output 2400 ml  ?Net -1630 ml  ? ?Filed Weights  ? 06/18/21 1200  ?Weight: 102.1 kg  ? ? ?Examination: ? ?General:  Pleasantly resting in bed, No acute distress. ?HEENT:  Normocephalic atraumatic.  Sclerae nonicteric, noninjected.  Extraocular movements intact bilaterally. ?Lungs:  Clear to auscultate bilaterally without rhonchi, wheeze, or rales. ?Heart:  Regular rate and rhythm.  Without murmurs, rubs, or gallops. ?Abdomen:  Soft, nontender, nondistended.  Without guarding or rebound. ?Extremities: Right BKA bandage clean dry intact ? ? ?Data Reviewed: I have personally reviewed following labs and imaging studies ? ?CBC: ?Recent Labs  ?Lab 06/24/21 ?0832 06/24/21 ?1645 06/25/21 ?3557 06/26/21 ?3220 06/29/21 ?0217  ?WBC 10.1  --   --  12.5* 12.6*  ?NEUTROABS  --   --   --  10.5*  --   ?HGB 10.4* 10.3* 10.0* 9.8* 10.6*  ?HCT 31.5* 31.4* 31.0* 29.7* 32.8*  ?MCV 89.7  --   --  89.7 90.6  ?PLT 370  --   --  475* 669*  ? ?Basic Metabolic Panel: ?Recent Labs  ?Lab 06/25/21 ?2542 06/26/21 ?0240 06/27/21 ?0107 06/27/21 ?0241 06/28/21 ?1447 06/29/21 ?0217  ?NA 131* 130* 132*  --  132* 130*  ?K 3.3* 4.2  --  4.2 4.4 4.4  ?CL 93* 97*  --   --  95* 95*  ?CO2 28 25  --   --  27 26  ?GLUCOSE 161* 150*  --   --  170* 143*  ?BUN 17 14  --   --  16 15  ?CREATININE 0.73 0.71  --   --  0.93 0.76  ?CALCIUM 7.6* 7.7*  --   --  8.4* 8.1*  ?MG  --   --   --  2.1 2.1  --   ? ?GFR: ?Estimated Creatinine Clearance: 103.2 mL/min (by C-G formula based on SCr of 0.76 mg/dL). ?Liver Function Tests: ?No results for input(s): AST, ALT, ALKPHOS, BILITOT, PROT, ALBUMIN in the last 168 hours. ?No results for input(s): LIPASE, AMYLASE in the last 168 hours. ?No results for input(s): AMMONIA in the last 168 hours. ?Coagulation Profile: ?No results for input(s): INR, PROTIME in the last 168 hours. ?Cardiac Enzymes: ?No results for input(s): CKTOTAL, CKMB, CKMBINDEX, TROPONINI in the last 168  hours. ?BNP (last 3 results) ?No results for input(s): PROBNP in the last 8760 hours. ?HbA1C: ?No results for input(s): HGBA1C in the last 72 hours. ?CBG: ?Recent Labs  ?Lab 06/29/21 ?0609 06/29/21 ?1122 06/29/21 ?1631 06/29/21 ?2135 06/30/21 ?7062  ?GLUCAP 139* 172* 136* 161* 126*  ? ?Lipid Profile: ?No results for input(s): CHOL, HDL, LDLCALC, TRIG, CHOLHDL, LDLDIRECT in the last 72 hours. ?Thyroid Function Tests: ?No results for input(s): TSH, T4TOTAL, FREET4, T3FREE, THYROIDAB in the last 72 hours. ?Anemia Panel: ?No results for input(s): VITAMINB12, FOLATE, FERRITIN, TIBC, IRON, RETICCTPCT in the last 72 hours. ?Sepsis Labs: ?No results  for input(s): PROCALCITON, LATICACIDVEN in the last 168 hours. ? ?Recent Results (from the past 240 hour(s))  ?Surgical pcr screen     Status: Abnormal  ? Collection Time: 06/21/21  8:35 PM  ? Specimen: Nasal Mucosa; Nasal Swab  ?Result Value Ref Range Status  ? MRSA, PCR NEGATIVE NEGATIVE Final  ? Staphylococcus aureus POSITIVE (A) NEGATIVE Final  ?  Comment: (NOTE) ?The Xpert SA Assay (FDA approved for NASAL specimens in patients 82 ?years of age and older), is one component of a comprehensive ?surveillance program. It is not intended to diagnose infection nor to ?guide or monitor treatment. ?Performed at [facility], [place] [address], [place], [place] ?[address] ?  ?  ? ? ? ? ? ?Radiology Studies: ?No results found. ? ? ? ? ? ?Scheduled Meds: ? acetaminophen  650 mg Oral Q6H  ? amLODipine  10 mg Oral Daily  ? aspirin EC  81 mg Oral Daily  ? Chlorhexidine Gluconate Cloth  6 each Topical Daily  ? docusate sodium  100 mg Oral BID  ? gabapentin  300 mg Oral TID  ? heparin injection (subcutaneous)  5,000 Units Subcutaneous Q8H  ? insulin aspart  0-5 Units Subcutaneous QHS  ? insulin aspart  0-9 Units Subcutaneous TID WC  ? irbesartan  300 mg Oral Daily  ? methocarbamol  500 mg Oral TID  ? metoprolol tartrate  25 mg Oral Q8H  ? multivitamin with minerals  1 tablet Oral Daily   ? pantoprazole  40 mg Oral BID  ? polyethylene glycol  17 g Oral BID  ? rosuvastatin  20 mg Oral Daily  ? senna  1 tablet Oral BID  ? sodium chloride flush  3 mL Intravenous Q12H  ? sodium chloride flush  3 mL

## 2021-06-30 NOTE — Progress Notes (Signed)
Nutrition Follow-up ? ?DOCUMENTATION CODES:  ? ?Non-severe (moderate) malnutrition in context of acute illness/injury ? ?INTERVENTION:  ? ?D/C snacks TID between meals. ? ?Continue MVI with minerals daily. ? ?NUTRITION DIAGNOSIS:  ? ?Moderate Malnutrition related to acute illness (R foot ischemia) as evidenced by mild muscle depletion, mild fat depletion. ? ?Ongoing  ? ?GOAL:  ? ?Patient will meet greater than or equal to 90% of their needs ? ?Progressing  ? ?MONITOR:  ? ?PO intake, Supplement acceptance, Labs, Skin ? ?REASON FOR ASSESSMENT:  ? ?Consult ?Wound healing ? ?ASSESSMENT:  ? ?71 y.o. male with history of CLL, chronic fascitis, DM type 2, and HTN sent to ED from podiatry appoint for concerns of ischemia to the right foot. ? 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 ?Awaiting CIR placement. ? ?Patient reports that he is eating well. He has not been eating the snacks sent up from the kitchen. RD to d/c snacks. Meal intakes documented at 100%. Patient declines all snacks and supplements at this time.  ? ?Labs reviewed. Na 130 3/21. ?CBG: 126-179 ? ?Medications reviewed and include Colace, Novolog, MVI with minerals, Miralax, Senokot. ? ? ?Diet Order:   ?Diet Order   ? ?       ?  Diet heart healthy/carb modified Room service appropriate? Yes; Fluid consistency: Thin  Diet effective now       ?  ? ?  ?  ? ?  ? ? ?EDUCATION NEEDS:  ? ?Education needs have been addressed ? ?Skin:  Skin Assessment: Reviewed RN Assessment (R leg BKA) ? ?Last BM:  3/21 type 6 ? ?Height:  ? ?Ht Readings from Last 1 Encounters:  ?06/18/21 6' (1.829 m)  ? ? ?Weight:  ? ?Wt Readings from Last 1 Encounters:  ?06/18/21 102.1 kg  ? ? ?Ideal Body Weight:  80.9 kg ? ?BMI:  Body mass index is 30.52 kg/m?. ? ?Estimated Nutritional Needs:  ? ?Kcal:  2300-2500 kcal/d ? ?Protein:  120-130g/d ? ?Fluid:  2.4-2.5 L/d ? ? ? ?Lucas Mallow RD, LDN, CNSC ?Please refer to Amion for contact information.                                                       ? ?

## 2021-07-01 ENCOUNTER — Other Ambulatory Visit: Payer: Self-pay

## 2021-07-01 ENCOUNTER — Encounter (HOSPITAL_COMMUNITY): Payer: Self-pay | Admitting: Physical Medicine and Rehabilitation

## 2021-07-01 ENCOUNTER — Inpatient Hospital Stay (HOSPITAL_COMMUNITY)
Admission: RE | Admit: 2021-07-01 | Discharge: 2021-07-19 | DRG: 560 | Disposition: A | Discharge status: Other Acute Inpatient Hospital | Payer: 59 | Source: Intra-hospital | Attending: Physical Medicine and Rehabilitation | Admitting: Physical Medicine and Rehabilitation

## 2021-07-01 DIAGNOSIS — Z801 Family history of malignant neoplasm of trachea, bronchus and lung: Secondary | ICD-10-CM

## 2021-07-01 DIAGNOSIS — Z4781 Encounter for orthopedic aftercare following surgical amputation: Secondary | ICD-10-CM | POA: Diagnosis present

## 2021-07-01 DIAGNOSIS — Z89511 Acquired absence of right leg below knee: Secondary | ICD-10-CM | POA: Diagnosis present

## 2021-07-01 DIAGNOSIS — E871 Hypo-osmolality and hyponatremia: Secondary | ICD-10-CM | POA: Diagnosis not present

## 2021-07-01 DIAGNOSIS — C9110 Chronic lymphocytic leukemia of B-cell type not having achieved remission: Secondary | ICD-10-CM | POA: Diagnosis present

## 2021-07-01 DIAGNOSIS — G546 Phantom limb syndrome with pain: Secondary | ICD-10-CM | POA: Diagnosis present

## 2021-07-01 DIAGNOSIS — N390 Urinary tract infection, site not specified: Secondary | ICD-10-CM | POA: Diagnosis not present

## 2021-07-01 DIAGNOSIS — G8918 Other acute postprocedural pain: Secondary | ICD-10-CM

## 2021-07-01 DIAGNOSIS — E785 Hyperlipidemia, unspecified: Secondary | ICD-10-CM | POA: Diagnosis present

## 2021-07-01 DIAGNOSIS — D62 Acute posthemorrhagic anemia: Secondary | ICD-10-CM | POA: Diagnosis present

## 2021-07-01 DIAGNOSIS — Z950 Presence of cardiac pacemaker: Secondary | ICD-10-CM | POA: Diagnosis not present

## 2021-07-01 DIAGNOSIS — Y835 Amputation of limb(s) as the cause of abnormal reaction of the patient, or of later complication, without mention of misadventure at the time of the procedure: Secondary | ICD-10-CM | POA: Diagnosis not present

## 2021-07-01 DIAGNOSIS — Z7982 Long term (current) use of aspirin: Secondary | ICD-10-CM | POA: Diagnosis not present

## 2021-07-01 DIAGNOSIS — I2510 Atherosclerotic heart disease of native coronary artery without angina pectoris: Secondary | ICD-10-CM | POA: Diagnosis present

## 2021-07-01 DIAGNOSIS — K5900 Constipation, unspecified: Secondary | ICD-10-CM | POA: Diagnosis present

## 2021-07-01 DIAGNOSIS — E1151 Type 2 diabetes mellitus with diabetic peripheral angiopathy without gangrene: Secondary | ICD-10-CM | POA: Diagnosis present

## 2021-07-01 DIAGNOSIS — I739 Peripheral vascular disease, unspecified: Secondary | ICD-10-CM

## 2021-07-01 DIAGNOSIS — I10 Essential (primary) hypertension: Secondary | ICD-10-CM | POA: Diagnosis present

## 2021-07-01 DIAGNOSIS — R319 Hematuria, unspecified: Secondary | ICD-10-CM | POA: Diagnosis not present

## 2021-07-01 DIAGNOSIS — Z87891 Personal history of nicotine dependence: Secondary | ICD-10-CM

## 2021-07-01 DIAGNOSIS — Z8249 Family history of ischemic heart disease and other diseases of the circulatory system: Secondary | ICD-10-CM

## 2021-07-01 DIAGNOSIS — R338 Other retention of urine: Secondary | ICD-10-CM | POA: Diagnosis present

## 2021-07-01 DIAGNOSIS — G4700 Insomnia, unspecified: Secondary | ICD-10-CM | POA: Diagnosis not present

## 2021-07-01 DIAGNOSIS — R7401 Elevation of levels of liver transaminase levels: Secondary | ICD-10-CM | POA: Diagnosis not present

## 2021-07-01 DIAGNOSIS — T8789 Other complications of amputation stump: Secondary | ICD-10-CM | POA: Diagnosis not present

## 2021-07-01 DIAGNOSIS — E1165 Type 2 diabetes mellitus with hyperglycemia: Secondary | ICD-10-CM | POA: Diagnosis not present

## 2021-07-01 DIAGNOSIS — Z79899 Other long term (current) drug therapy: Secondary | ICD-10-CM | POA: Diagnosis not present

## 2021-07-01 DIAGNOSIS — N401 Enlarged prostate with lower urinary tract symptoms: Secondary | ICD-10-CM | POA: Diagnosis present

## 2021-07-01 DIAGNOSIS — I472 Ventricular tachycardia, unspecified: Secondary | ICD-10-CM | POA: Diagnosis present

## 2021-07-01 DIAGNOSIS — B965 Pseudomonas (aeruginosa) (mallei) (pseudomallei) as the cause of diseases classified elsewhere: Secondary | ICD-10-CM | POA: Diagnosis not present

## 2021-07-01 DIAGNOSIS — A499 Bacterial infection, unspecified: Secondary | ICD-10-CM | POA: Diagnosis not present

## 2021-07-01 DIAGNOSIS — R339 Retention of urine, unspecified: Secondary | ICD-10-CM

## 2021-07-01 LAB — GLUCOSE, CAPILLARY
Glucose-Capillary: 124 mg/dL — ABNORMAL HIGH (ref 70–99)
Glucose-Capillary: 139 mg/dL — ABNORMAL HIGH (ref 70–99)
Glucose-Capillary: 135 mg/dL — ABNORMAL HIGH (ref 70–99)
Glucose-Capillary: 225 mg/dL — ABNORMAL HIGH (ref 70–99)

## 2021-07-01 MED ORDER — ADULT MULTIVITAMIN W/MINERALS CH
1.0000 | ORAL_TABLET | Freq: Every day | ORAL | Status: DC
  Administered 2021-07-02 – 2021-07-18 (×17): 1 via ORAL
  Filled 2021-07-01 (×17): qty 1

## 2021-07-01 MED ORDER — ROSUVASTATIN CALCIUM 20 MG PO TABS: 20.0000 mg | ORAL_TABLET | Freq: Every day | ORAL | 0 refills | Status: DC

## 2021-07-01 MED ORDER — ASPIRIN 81 MG PO TBEC: 81.0000 mg | DELAYED_RELEASE_TABLET | Freq: Every day | ORAL | 11 refills | Status: DC

## 2021-07-01 MED ORDER — METHOCARBAMOL 500 MG PO TABS: 500.0000 mg | ORAL_TABLET | Freq: Three times a day (TID) | ORAL | 0 refills | Status: DC

## 2021-07-01 MED ORDER — OXYCODONE HCL 5 MG PO TABS
5.0000 mg | ORAL_TABLET | ORAL | Status: DC | PRN
  Administered 2021-07-01 – 2021-07-19 (×59): 5 mg via ORAL
  Filled 2021-07-01 (×63): qty 1

## 2021-07-01 MED ORDER — HEPARIN SODIUM (PORCINE) 5000 UNIT/ML IJ SOLN
5000.0000 [IU] | Freq: Three times a day (TID) | INTRAMUSCULAR | Status: DC
  Administered 2021-07-01 – 2021-07-18 (×51): 5000 [IU] via SUBCUTANEOUS
  Filled 2021-07-01 (×51): qty 1

## 2021-07-01 MED ORDER — GABAPENTIN 300 MG PO CAPS: 300.0000 mg | ORAL_CAPSULE | Freq: Three times a day (TID) | ORAL | 0 refills | Status: DC

## 2021-07-01 MED ORDER — PANTOPRAZOLE SODIUM 40 MG PO TBEC
40.0000 mg | DELAYED_RELEASE_TABLET | Freq: Two times a day (BID) | ORAL | Status: DC
  Administered 2021-07-01 – 2021-07-18 (×35): 40 mg via ORAL
  Filled 2021-07-01 (×35): qty 1

## 2021-07-01 MED ORDER — IRBESARTAN 300 MG PO TABS: 300.0000 mg | ORAL_TABLET | Freq: Every day | ORAL | 0 refills | Status: DC

## 2021-07-01 MED ORDER — ALBUTEROL SULFATE (2.5 MG/3ML) 0.083% IN NEBU
3.0000 mL | INHALATION_SOLUTION | Freq: Four times a day (QID) | RESPIRATORY_TRACT | Status: DC | PRN
  Filled 2021-07-01: qty 3

## 2021-07-01 MED ORDER — BISACODYL 5 MG PO TBEC
5.0000 mg | DELAYED_RELEASE_TABLET | Freq: Every day | ORAL | Status: DC | PRN
Start: 2021-07-01 — End: 2021-07-19
  Administered 2021-07-18: 5 mg via ORAL
  Filled 2021-07-01: qty 1

## 2021-07-01 MED ORDER — MONTELUKAST SODIUM 10 MG PO TABS: 10.0000 mg | ORAL_TABLET | Freq: Every day | ORAL | Status: DC | PRN

## 2021-07-01 MED ORDER — METOPROLOL TARTRATE 25 MG PO TABS: 25.0000 mg | ORAL_TABLET | Freq: Two times a day (BID) | ORAL | 0 refills | Status: DC

## 2021-07-01 MED ORDER — GABAPENTIN 300 MG PO CAPS
300.0000 mg | ORAL_CAPSULE | Freq: Three times a day (TID) | ORAL | Status: DC
  Administered 2021-07-01 – 2021-07-07 (×18): 300 mg via ORAL
  Filled 2021-07-01 (×18): qty 1

## 2021-07-01 MED ORDER — TERAZOSIN HCL 5 MG PO CAPS
10.0000 mg | ORAL_CAPSULE | Freq: Every day | ORAL | Status: DC
  Administered 2021-07-01 – 2021-07-11 (×11): 10 mg via ORAL
  Filled 2021-07-01 (×11): qty 2

## 2021-07-01 MED ORDER — HEPARIN SODIUM (PORCINE) 5000 UNIT/ML IJ SOLN: 5000.0000 [IU] | Freq: Three times a day (TID) | INTRAMUSCULAR | Status: DC

## 2021-07-01 MED ORDER — METOPROLOL TARTRATE 25 MG PO TABS
25.0000 mg | ORAL_TABLET | Freq: Three times a day (TID) | ORAL | Status: DC
  Administered 2021-07-01 – 2021-07-19 (×51): 25 mg via ORAL
  Filled 2021-07-01 (×53): qty 1

## 2021-07-01 MED ORDER — ONDANSETRON HCL 4 MG PO TABS: 4.0000 mg | ORAL_TABLET | Freq: Four times a day (QID) | ORAL | Status: DC | PRN

## 2021-07-01 MED ORDER — AMLODIPINE BESYLATE 10 MG PO TABS
10.0000 mg | ORAL_TABLET | Freq: Every day | ORAL | Status: DC
  Administered 2021-07-02 – 2021-07-18 (×17): 10 mg via ORAL
  Filled 2021-07-01 (×17): qty 1

## 2021-07-01 MED ORDER — ROSUVASTATIN CALCIUM 20 MG PO TABS
20.0000 mg | ORAL_TABLET | Freq: Every day | ORAL | Status: DC
  Administered 2021-07-02 – 2021-07-18 (×17): 20 mg via ORAL
  Filled 2021-07-01 (×16): qty 1

## 2021-07-01 MED ORDER — INSULIN ASPART 100 UNIT/ML IJ SOLN
0.0000 [IU] | Freq: Three times a day (TID) | INTRAMUSCULAR | Status: DC
  Administered 2021-07-01 – 2021-07-02 (×2): 1 [IU] via SUBCUTANEOUS
  Administered 2021-07-02: 2 [IU] via SUBCUTANEOUS
  Administered 2021-07-02: 1 [IU] via SUBCUTANEOUS
  Administered 2021-07-03: 2 [IU] via SUBCUTANEOUS
  Administered 2021-07-03: 1 [IU] via SUBCUTANEOUS
  Administered 2021-07-03 – 2021-07-06 (×9): 2 [IU] via SUBCUTANEOUS
  Administered 2021-07-06: 3 [IU] via SUBCUTANEOUS
  Administered 2021-07-07 – 2021-07-08 (×5): 2 [IU] via SUBCUTANEOUS
  Administered 2021-07-08 – 2021-07-09 (×2): 1 [IU] via SUBCUTANEOUS
  Administered 2021-07-09 (×2): 2 [IU] via SUBCUTANEOUS
  Administered 2021-07-10: 3 [IU] via SUBCUTANEOUS
  Administered 2021-07-10 (×2): 2 [IU] via SUBCUTANEOUS
  Administered 2021-07-11: 1 [IU] via SUBCUTANEOUS
  Administered 2021-07-11: 2 [IU] via SUBCUTANEOUS
  Administered 2021-07-11: 1 [IU] via SUBCUTANEOUS
  Administered 2021-07-12 (×3): 2 [IU] via SUBCUTANEOUS
  Administered 2021-07-13 (×3): 1 [IU] via SUBCUTANEOUS
  Administered 2021-07-14: 2 [IU] via SUBCUTANEOUS
  Administered 2021-07-14: 1 [IU] via SUBCUTANEOUS
  Administered 2021-07-14: 2 [IU] via SUBCUTANEOUS
  Administered 2021-07-15: 1 [IU] via SUBCUTANEOUS
  Administered 2021-07-15 – 2021-07-18 (×7): 2 [IU] via SUBCUTANEOUS
  Administered 2021-07-18: 1 [IU] via SUBCUTANEOUS
  Administered 2021-07-18: 3 [IU] via SUBCUTANEOUS

## 2021-07-01 MED ORDER — ONDANSETRON HCL 4 MG/2ML IJ SOLN: 4.0000 mg | Freq: Four times a day (QID) | INTRAMUSCULAR | Status: DC | PRN

## 2021-07-01 MED ORDER — AMLODIPINE BESYLATE 10 MG PO TABS: 10.0000 mg | ORAL_TABLET | Freq: Every day | ORAL | 0 refills | Status: DC

## 2021-07-01 MED ORDER — POLYETHYLENE GLYCOL 3350 17 G PO PACK
17.0000 g | PACK | Freq: Two times a day (BID) | ORAL | Status: DC
  Administered 2021-07-01 – 2021-07-18 (×23): 17 g via ORAL
  Filled 2021-07-01 (×34): qty 1

## 2021-07-01 MED ORDER — PANTOPRAZOLE SODIUM 40 MG PO TBEC: 40.0000 mg | DELAYED_RELEASE_TABLET | Freq: Two times a day (BID) | ORAL | 0 refills | Status: DC

## 2021-07-01 MED ORDER — ASPIRIN EC 81 MG PO TBEC
81.0000 mg | DELAYED_RELEASE_TABLET | Freq: Every day | ORAL | Status: DC
  Administered 2021-07-02 – 2021-07-18 (×17): 81 mg via ORAL
  Filled 2021-07-01 (×17): qty 1

## 2021-07-01 MED ORDER — DOCUSATE SODIUM 100 MG PO CAPS
100.0000 mg | ORAL_CAPSULE | Freq: Two times a day (BID) | ORAL | Status: DC
Start: 2021-07-01 — End: 2021-07-19
  Administered 2021-07-01 – 2021-07-18 (×30): 100 mg via ORAL
  Filled 2021-07-01 (×34): qty 1

## 2021-07-01 MED ORDER — IRBESARTAN 300 MG PO TABS
300.0000 mg | ORAL_TABLET | Freq: Every day | ORAL | Status: DC
  Administered 2021-07-02 – 2021-07-18 (×17): 300 mg via ORAL
  Filled 2021-07-01 (×17): qty 1

## 2021-07-01 MED ORDER — ACETAMINOPHEN 325 MG PO TABS
325.0000 mg | ORAL_TABLET | ORAL | Status: DC | PRN
  Administered 2021-07-02: 650 mg via ORAL
  Filled 2021-07-01: qty 2

## 2021-07-01 MED ORDER — METHOCARBAMOL 500 MG PO TABS
500.0000 mg | ORAL_TABLET | Freq: Three times a day (TID) | ORAL | Status: DC
  Administered 2021-07-01 – 2021-07-18 (×52): 500 mg via ORAL
  Filled 2021-07-01 (×52): qty 1

## 2021-07-01 MED ORDER — SENNA 8.6 MG PO TABS
1.0000 | ORAL_TABLET | Freq: Two times a day (BID) | ORAL | Status: DC
  Administered 2021-07-01 – 2021-07-18 (×29): 8.6 mg via ORAL
  Filled 2021-07-01 (×34): qty 1

## 2021-07-01 NOTE — TOC Transition Note (Signed)
Transition of Care (TOC) - CM/SW Discharge Note ?Marvetta Gibbons Therapist, sports, BSN ?Transitions of Care ?Unit 4E- RN Case Manager ?See Treatment Team for direct phone #  ? ? ?Patient Details  ?Name: Douglas [name] ?MRN: [id number] ?Date of Birth: June 22, 1950 ? ?Transition of Care (TOC) CM/SW Contact:  ?Douglas [name], Romeo Rabon, RN ?Phone Number: ?07/01/2021, 3:48 PM ? ? ?Clinical Narrative:    ?Notified by Pamala Hurry with [facility] re[facility] - pt has received insurance auth for [facility]- per Pamala Hurry pt has bed available today and INPT rehab can admit later this afternoon.  ?MD has cleared pt to transition to [facility] INPT rehab today. D/C order has been placed.  ? ? ?Final next level of care: [place] ?Barriers to Discharge: Barriers Resolved ? ? ?Patient Goals and CMS Choice ?Patient states their goals for this hospitalization and ongoing recovery are:: wants inpatient rehab ?CMS Medicare.gov Compare Post Acute Care list provided to:: Patient ?Choice offered to / list presented to : Patient, Spouse ? ?Discharge Placement ?  ?           ?  ? [facility] INPT rehab.  ?  ?  ? ?Discharge Plan and Services ?In-house Referral: Clinical Social Work ?Discharge Planning Services: CM Consult ?Post Acute Care Choice: IP Rehab          ?  ?  ?  ?  ?  ?  ?  ?  ?  ?  ? ?Social Determinants of Health (SDOH) Interventions ?  ? ? ?Readmission Risk Interventions ? ?  07/01/2021  ?  3:48 PM  ?Readmission Risk Prevention Plan  ?Transportation Screening Complete  ?Medication Review Press photographer) Complete  ?[place] or Home Care Consult Complete  ?SW Recovery Care/Counseling Consult Complete  ?Palliative Care Screening Not Applicable  ?[place] Not Applicable  ? ? ? ? ? ?

## 2021-07-01 NOTE — Progress Notes (Signed)
Patient was inquiring about his chemo drug. . Per patient he brought his medicine in. Per pharmacy they were dispensing but it was stopped due to risk of bleeding.Dan PA notified and he said he will follow up.PAtient made aware. ?

## 2021-07-01 NOTE — Discharge Summary (Signed)
?Physician Discharge Summary ?  ?Patient: Douglas [name] MRN: [id number] DOB: Oct 15, 1950  ?Admit date:     06/18/2021  ?Discharge date: 07/01/21  ?Discharge Physician: Little Ishikawa  ? ?PCP: [name], MD  ? ?Discharge Diagnoses: ?Principal Problem: ?  Critical limb ischemia of right lower extremity ([place]) ?Active Problems: ?  Coffee ground emesis ?  Physical debility ?  CLL (chronic lymphocytic leukemia) ([place]) ?  Hypertension ?  Diabetes mellitus ([place]) ?  BPH (benign prostatic hyperplasia) ?  Pacemaker ?  Constipation ?  Hypokalemia ?  Malnutrition of moderate degree ? ?Resolved Problems: ?  * No resolved hospital problems. * ? ?Hospital Course: ?No notes on file ? ?Assessment and Plan: ?Critical limb ischemia of right lower extremity ([place]) ?-ABIs showed severe right lower extremity arterial disease, found to have critical right lower extremity ischemia.  ?-Vascular surgery following, status post bypass/thrombectomy on 3/14. 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 CIR ?F/u with vascular in 1 month for staple removal ?  ?Coffee ground emesis ?Had significant coffee-ground emesis on 3/14.  ?He does state he has never had EGD or colonoscopy. ?3/20 no further episodes.  ?H&H remained stable ?We will need a GI referral on discharge  ?  ?Urinary retention ?Foley removed 3/20 with failure of voiding trial, replaced 3/21  ?  ?NSVT ?Cards consulted ?Lopressor '25mg'$  bid added, titrate as tolerated  ?Echo with nml EF. Has non obstructive CAD cath 10/22. ?  ?Physical debility ?Therapy recommended CIR, authorization pending ?  ?Hypokalemia ?Replaced and stable ?  ?Hyponatremia ?More chronic. ?We will hold HCTZ ?  ?CLL (chronic lymphocytic leukemia) ([place]) ?-Diagnosed in 11/2018.   ?-Has done well with ibrutinib, and is continuing this medication. ?-Followed by Dr. [name]. ?  ?Constipation ?Continue bowel regimen ?  ?Pacemaker ?-High grade heart block ?-Pacer in place and working well in  January per [name] ?-[name] was performed in 01/2021 pre-procedure with no obvious obstructive CAD ?  ?BPH (benign prostatic hyperplasia) ?-continue terazosin. Has intermittent issues of urinary retention ?  ?Diabetes mellitus ([place]) ?-Last A1c was 6.7 ?-He is not on medications at home ?-Continue with sensitive-scale SSI ?  ?Hypertension ?-Continue ARB, amlodipine, and terazosin ?Discontinue HCTZ due to hyponatremia ?Increase amlodipine to 10 mg daily ? ? ?  ? ? ?Consultants: Vascular surgery ?Procedures performed: Bypass thrombectomy RLE, R BKA  ?Disposition: Rehabilitation facility ?Diet recommendation:  ?Regular diet ?DISCHARGE MEDICATION: ?Allergies as of 07/01/2021   ?Not on File ?  ? ?  ?Medication List  ?  ? ?STOP taking these medications   ? ?valsartan-hydrochlorothiazide 320-25 MG tablet ?Commonly known as: DIOVAN-HCT ?  ? ?  ? ?TAKE these medications   ? ?albuterol 108 (90 Base) MCG/ACT inhaler ?Commonly known as: VENTOLIN HFA ?Inhale 2 puffs into the lungs 4 (four) times daily. ?What changed:  ?when to take this ?reasons to take this ?  ?amLODipine 10 MG tablet ?Commonly known as: NORVASC ?Take 1 tablet (10 mg total) by mouth daily. ?Start taking on: July 02, 2021 ?What changed:  ?medication strength ?how much to take ?  ?aspirin 81 MG EC tablet ?Take 1 tablet (81 mg total) by mouth daily. Swallow whole. ?Start taking on: July 02, 2021 ?  ?gabapentin 300 MG capsule ?Commonly known as: NEURONTIN ?Take 1 capsule (300 mg total) by mouth 3 (three) times daily. ?  ?Imbruvica 420 MG tablet ?Generic drug: ibrutinib ?TAKE 1 TABLET BY MOUTH ONCE  DAILY WITH A FULL GLASS OF WATER ?  What changed: See the new instructions. ?  ?irbesartan 300 MG tablet ?Commonly known as: AVAPRO ?Take 1 tablet (300 mg total) by mouth daily. ?Start taking on: July 02, 2021 ?  ?Lidocaine 4 % Lotn ?Apply 1 application. topically daily as needed (pain). ?  ?methocarbamol 500 MG tablet ?Commonly known as: ROBAXIN ?Take 1 tablet (500 mg  total) by mouth 3 (three) times daily. ?  ?metoprolol tartrate 25 MG tablet ?Commonly known as: LOPRESSOR ?Take 1 tablet (25 mg total) by mouth 2 (two) times daily. ?  ?montelukast 10 MG tablet ?Commonly known as: SINGULAIR ?Take 10 mg by mouth daily as needed (allergies). ?  ?oxymetazoline 0.05 % nasal spray ?Commonly known as: AFRIN ?Place 1 spray into both nostrils 2 (two) times daily as needed for congestion. ?  ?pantoprazole 40 MG tablet ?Commonly known as: PROTONIX ?Take 1 tablet (40 mg total) by mouth 2 (two) times daily. ?  ?rosuvastatin 20 MG tablet ?Commonly known as: CRESTOR ?Take 1 tablet (20 mg total) by mouth daily. ?Start taking on: July 02, 2021 ?  ?terazosin 10 MG capsule ?Commonly known as: HYTRIN ?Take 10 mg by mouth at bedtime. ?  ? ?  ? ? Follow-up Information   ? ? [facility] Follow up in 1 month(s).   ?Why: The office will call the patient with an appointment(sent) ?Contact information: ?[address] ?[place] ?[phone] ? ?  ?  ? ?  ?  ? ?  ? ?Discharge Exam: ?Filed Weights  ? 06/18/21 1200  ?Weight: 102.1 kg  ? ? ?Condition at discharge: good ? ?The results of significant diagnostics from this hospitalization (including imaging, microbiology, ancillary and laboratory) are listed below for reference.  ? ?Imaging Studies: ?DG Abd 1 View ? ?Result Date: 06/22/2021 ?CLINICAL DATA:  Abdominal pain and distension. EXAM: ABDOMEN - 1 VIEW COMPARISON:  None. FINDINGS: There is diffuse gaseous distension of the gastric lumen. No dilated loops of large or small bowel. IMPRESSION: Diffuse gaseous distension of the gastric lumen. Electronically Signed   By: Kerby Moors M.D.   On: 06/22/2021 06:04  ? ?PERIPHERAL VASCULAR CATHETERIZATION ? ?Result Date: 06/21/2021 ?Images from the original result were not included. Patient name: Douglas [name] MRN: [id number] DOB: Jan 01, 1951 Sex: male 06/21/2021 Pre-operative Diagnosis: Critical right lower extremity  ischemia with rest pain Post-operative diagnosis:  Same Surgeon:  Eda Paschal. Donzetta Matters, MD Procedure Performed: 1.  Ultrasound-guided cannulation left common femoral artery 2.  Aortogram 3.  Selection of right common femoral artery and right lower extremity angiogram 4.  Left lower extremity angiogram 5.  Moderate sedation with fentanyl and Versed for 27 minutes Indications: 71 year old male presented with right foot pain for 2 weeks.  He also has 2 to 3 months of claudication now with discoloration of the right foot and severely diminished ABIs on the right with mildly depressed ABIs on the left.  He is now indicated for angiography with possible intervention. Findings: The aorta and iliac segments are free of flow-limiting stenosis.  Bilateral renal arteries are patent.  Right lower extremity SFA occludes at the the takeoff there is no stump.  He reconstitutes above the knee where it is diseased and below the knee is healthier.  He has runoff dominant via the posterior tibial artery but this gives out at the level of the foot.  On the left side he has normal SFA proximally this occludes for approximately 12 cm in the distal SFA at the adductor but reconstitutes and  has three-vessel runoff to the left foot. Patient will need right common femoral to below-knee popliteal artery bypass with tibial thrombectomy.  He remains at high risk for major amputation.  Procedure:  The patient was identified in the holding area and taken to room 8.  The patient was then placed supine on the table and prepped and draped in the usual sterile fashion.  A time out was called.  Ultrasound was used to evaluate the left common femoral artery.  There is no spasm percent lidocaine cannulated with direct ultrasound visualization followed by micropuncture wire and sheath.  And images saved the permanent record.  Bentson wires placed followed by 5 [place] sheath.  Omni catheter was placed to the level of L1 aortogram was performed.  We crossed the  bifurcation of the right common femoral artery using Omni catheter and Bentson wire.  We then performed right lower extremity angiography with above findings.  We then remove the catheter over wire left lower e

## 2021-07-01 NOTE — Progress Notes (Addendum)
[facility] [place] ? ?Subjective  - the right stump is more painful today and last night.  He asked the RN to remove the ace and this made his pain more bearable.  ? ? ?Objective ?(!) 116/56 ?80 ?97.9 ?F (36.6 ?C) (Oral) ?20 ?98% ? ?Intake/Output Summary (Last 24 hours) at 07/01/2021 0711 ?Last data filed at 07/01/2021 0602 ?Gross per 24 hour  ?Intake 483 ml  ?Output 2250 ml  ?Net -1767 ml  ? ? ?Right BKA with ant. Lateral blister, dark spot 2-3 cm, mild erythema with coolness to touch anterior flap.  Min-mod drainage anterior and medial dressing.  Dry dressing reapplied.  ? ? ? ? ? ?Assessment/Planning: ?Right BKA ? ?Stump appears viable, but I am concerned about the increase in pain and mild color changes.  We will continue to observe for changes. ?Pending CIR ? ? ?Roxy Horseman ?07/01/2021 ?7:11 AM ?-- ? ?Laboratory ?Lab Results: ?Recent Labs  ?  06/29/21 ?0217  ?WBC 12.6*  ?HGB 10.6*  ?HCT 32.8*  ?PLT 669*  ? ?BMET ?Recent Labs  ?  06/28/21 ?1447 06/29/21 ?0217  ?NA 132* 130*  ?K 4.4 4.4  ?CL 95* 95*  ?CO2 27 26  ?GLUCOSE 170* 143*  ?BUN 16 15  ?CREATININE 0.93 0.76  ?CALCIUM 8.4* 8.1*  ? ? ?COAG ?Lab Results  ?Component Value Date  ? INR 1.0 06/18/2021  ? INR 1.0 01/16/2021  ? ?No results found for: PTT ? ?I have independently interviewed and examined patient and agree with PA assessment and plan above.  There was blistering on the anterior aspect of the below-knee amputation site I removed the fluid from this.  There is also drainage from the medial previous bypass incision that was serosanguineous.  The posterior flap of the amputation is warm and appears well perfused.  Should be okay for discharge to CIR we will keep close tabs on him after discharge. ? ?Kaydn Kumpf C. Donzetta Matters, MD ?[facility] ?Office: [phone] ?Pager: [phone] ? ? ?

## 2021-07-01 NOTE — Progress Notes (Signed)
07/01/2021 ?4:14 PM  ? ?Report given to Rehab nurse.  Pt transferred to 4W-12. ? ?Douglas [name] C ? ?

## 2021-07-01 NOTE — Progress Notes (Signed)
Occupational Therapy Treatment ?Patient Details ?Name: Douglas [name] ?MRN: [id number] ?DOB: 16-Mar-1951 ?Today's Date: 07/01/2021 ? ? ?History of present illness Patient is a 71 yo male admitted on 06/18/2021 with right foot swelling concerning for ischemic leg. Status post bypass and thrombectomy 06/22/21. 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 PMH: pacemaker, DM, HTN, and CLL. ?  ?OT comments ? Session focused on progression of dynamic sitting balance and functional transfers. Pt remains with noted LOB with dynamic tasks sitting EOB increasing fall risk for LB ADLs. Pt able to demo good UB strength to pull self up in [place] with Min A x 2 (for safety) and transfer to recliner. Pt reported increased pain from B hand IV locations when attempting RW use (asked RN if we can remove or switch IV locations to progress standing with RW). Pt with noted bloody drainage from residual limb today open to air (covered for transfer, removed after session) and reports decreased pain with residual limb in dependent position in chair. Educated that bloody drainage increases with dependent position, as well as edema and risk of knee contractures though pt adamant on avoiding fully elevating residual limb at this time. Plan to progress LB ADL techniques with dynamic sitting balance tasks in next session.   ? ?Recommendations for follow up therapy are one component of a multi-disciplinary discharge planning process, led by the attending physician.  Recommendations may be updated based on patient status, additional functional criteria and insurance authorization. ?   ?Follow Up Recommendations ? Acute inpatient rehab (3hours/day)  ?  ?Assistance Recommended at Discharge Intermittent Supervision/Assistance  ?Patient can return home with the following ? Two people to help with walking and/or transfers;A lot of help with bathing/dressing/bathroom;Assist for transportation;Help with stairs or ramp for entrance;Assistance with cooking/housework ?  ?Equipment  Recommendations ? Wheelchair (measurements OT);Wheelchair cushion (measurements OT);Other (comment) (Rolling walker; TBD)  ?  ?Recommendations for Other Services Rehab consult ? ?  ?Precautions / Restrictions Precautions ?Precautions: Fall ?Precaution Comments: R BKA 3/17 ?Required Braces or Orthoses: Other Brace ?Other Brace: limb guard ?Restrictions ?Weight Bearing Restrictions: Yes ?RLE Weight Bearing: Non weight bearing  ? ? ?  ? ?Mobility Bed Mobility ?Overal bed mobility: Needs Assistance ?Bed Mobility: Supine to Sit ?  ?  ?Supine to sit: HOB elevated, Min assist, +2 for safety/equipment ?  ?  ?General bed mobility comments: good effort on pt's part with Min A to lift trunk using bedrail. pt able to use bedrails to push/pull self up in bed relatively well, cues for improved technique. close to min guard ?  ? ?Transfers ?Overall transfer level: Needs assistance ?Equipment used: Ambulation equipment used, Rolling walker (2 wheels) ?Transfers: Sit to/from Stand, Bed to chair/wheelchair/BSC ?Sit to Stand: Min assist, +2 safety/equipment ?  ?  ?  ?  ?  ?General transfer comment: Pt actually able to pull self up in [place] (without elevated bed) with Min A at most (tech present for safety) and transferred to recliner. Pt reports pain in B hands with RW attempts and declined to trial this; also due to nausea ?Transfer via Lift Equipment: [place] ?  ?Balance Overall balance assessment: Needs assistance ?Sitting-balance support: Feet supported, No upper extremity supported ?Sitting balance-Leahy Scale: Fair ?Sitting balance - Comments: able to statically sit, dynamic balance poor with posterior/L LOB ?Postural control: Posterior lean, Left lateral lean ?Standing balance support: Reliant on assistive device for balance ?Standing balance-Leahy Scale: Poor ?  ?  ?  ?  ?  ?  ?  ?  ?  ?  ?  ?  ?   ? ?  ADL either performed or assessed with clinical judgement  ? ?ADL Overall ADL's : Needs assistance/impaired ?  ?  ?Grooming: Set  up;Sitting ?  ?  ?  ?  ?  ?  ?  ?Lower Body Dressing: Maximal assistance;Sitting/lateral leans ?Lower Body Dressing Details (indicate cue type and reason): due to poor dynamic sitting balance and safety concerns in bending forward ?  ?  ?  ?  ?  ?  ?  ?General ADL Comments: Focus on OOB attempts though limited with new drainage of residual limb (per pt, MD rec to leave open to air) with attempt to coordinate with staff for bandaging for safe OOB attempts. Pt refuses limb protector, elevation of residual limb (as keeping it down helps with the pain) ?  ? ?Extremity/Trunk Assessment Upper Extremity Assessment ?Upper Extremity Assessment: Overall WFL for tasks assessed ?  ?Lower Extremity Assessment ?Lower Extremity Assessment: Defer to PT evaluation ?  ?  ?  ? ?Vision   ?Vision Assessment?: No apparent visual deficits ?  ?Perception   ?  ?Praxis   ?  ? ?Cognition Arousal/Alertness: Awake/alert ?Behavior During Therapy: [facility] for tasks assessed/performed ?Overall Cognitive Status: Within Functional Limits for tasks assessed ?  ?  ?  ?  ?  ?  ?  ?  ?  ?  ?  ?  ?  ?  ?  ?  ?General Comments: Pt pleasant, WFL cognitively, at times self-limiting and particular despite education on optimal healing of residual limb ?  ?  ?   ?Exercises   ? ?  ?Shoulder Instructions   ? ? ?  ?General Comments VSS on RA. Bloody drainage noted on entry; increased s/p OOB to chair and with pt adamant on having limb in dependent position. Pt eventually did allow footrest to elevate partially but declined pillow underneath. Encouraged pt to gradually lift footrest and place pillow under limb to prevent increased drainage, edema, or knee ROM deficits  ? ? ?Pertinent Vitals/ Pain       Pain Assessment ?Pain Assessment: 0-10 ?Pain Score: 4  ?Pain Location: RLE ?Pain Descriptors / Indicators: Discomfort, Grimacing, Guarding ?Pain Intervention(s): Monitored during session, Limited activity within patient's tolerance ? ?Home Living   ?  ?  ?  ?  ?  ?  ?   ?  ?  ?  ?  ?  ?  ?  ?  ?  ?  ?  ? ?  ?Prior Functioning/Environment    ?  ?  ?  ?   ? ?Frequency ? Min 2X/week  ? ? ? ? ?  ?Progress Toward Goals ? ?OT Goals(current goals can now be found in the care plan section) ? Progress towards OT goals: Progressing toward goals ? ?Acute Rehab OT Goals ?Patient Stated Goal: pain control, go to rehab ?OT Goal Formulation: With patient ?Time For Goal Achievement: 07/07/21 ?Potential to Achieve Goals: Fair ?ADL Goals ?Pt Will Perform Grooming: standing;Independently ?Pt Will Perform Lower Body Bathing: sitting/lateral leans;with adaptive equipment;sit to/from stand;Independently ?Pt Will Perform Lower Body Dressing: Independently;with adaptive equipment;sitting/lateral leans;sit to/from stand ?Pt Will Transfer to Toilet: ambulating;with supervision  ?Plan Discharge plan remains appropriate   ? ?Co-evaluation ? ? ?   ?  ?  ?  ?  ? ?  ?AM-PAC OT "6 Clicks" Daily Activity     ?Outcome Measure ? ? Help from another person eating meals?: None ?Help from another person taking care of personal grooming?: A Little ?Help from another person  toileting, which includes using toliet, bedpan, or urinal?: A Lot ?Help from another person bathing (including washing, rinsing, drying)?: A Lot ?Help from another person to put on and taking off regular upper body clothing?: A Little ?Help from another person to put on and taking off regular lower body clothing?: A Lot ?6 Click Score: 16 ? ?  ?End of Session Equipment Utilized During Treatment: Gait belt ? ?OT Visit Diagnosis: Unsteadiness on feet (R26.81);Other abnormalities of gait and mobility (R26.89);Muscle weakness (generalized) (M62.81);Pain ?Pain - Right/Left: Right ?Pain - part of body: Leg ?  ?Activity Tolerance Patient tolerated treatment well ?  ?Patient Left in chair;with call bell/phone within reach;with chair alarm set ?  ?Nurse Communication Mobility status;Other (comment) (drainage; IV in hands limiting) ?  ? ?   ? ?Time:  1062-6948 ?OT Time Calculation (min): 32 min ? ?Charges: OT General Charges ?$OT Visit: 1 Visit ?OT Treatments ?$Self Care/Home Management : 8-22 mins ?$Therapeutic Activity: 8-22 mins ? ?Malachy Chamber, OTR/L ?Acute Rehab [facility]

## 2021-07-01 NOTE — Progress Notes (Signed)
Inpatient Rehabilitation Admissions Coordinator  ? ?I await insurance approval for a possible Cir admit. ? ?Danne Baxter, RN, MSN ?Rehab Admissions Coordinator ?[phone] ?07/01/2021 12:23 PM ? ?

## 2021-07-01 NOTE — Progress Notes (Signed)
Arrived to the unit with no issues. Oriented to unit and assigned room. Admission complete. ? ? ? ?Yehuda Mao, LPN ?

## 2021-07-01 NOTE — Progress Notes (Signed)
?PROGRESS NOTE ? ? ? ?Douglas [name]  JOA:[id number] DOB: 03/27/1951 DOA: 06/18/2021 ?PCP: [name], MD ? ? ?Brief Narrative:  ?Patient is a 71 year old male with history of BPH, CLL, hypertension who presented with right foot swelling, purple discoloration, pain, inability to ambulate.  On presentation he was found to have ischemic right lower extremity, started on heparin drip.  Vascular surgery consulted.  Found to have critical right lower extremity ischemia, status post right  lower extremity angiogram.  Vascular surgery did  bypass surgery with thrombectomy.  Hospital course also remarkable for coffee-ground emesis with a stable hemoglobin.  The limb turned out to be  unsalvageable so vascular surgery planning for right BKA . Now s/p Rt BKA ?  ?3/20 night MD reported NSVT. Cardiology consulted. Foley DC'd ?3/21 Post void urinary retention improved with foley placement ? ? ?Assessment & Plan: ?  ?Principal Problem: ?  Critical limb ischemia of right lower extremity ([place]) ?Active Problems: ?  Coffee ground emesis ?  Physical debility ?  CLL (chronic lymphocytic leukemia) ([place]) ?  Hypertension ?  Diabetes mellitus ([place]) ?  BPH (benign prostatic hyperplasia) ?  Pacemaker ?  Constipation ?  Hypokalemia ?  Malnutrition of moderate degree ? ?Critical limb ischemia of right lower extremity ([place]) ?-ABIs showed severe right lower extremity arterial disease, found to have critical right lower extremity ischemia.  ?-Vascular surgery following, status post bypass/thrombectomy on 3/14. 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 CIR ?F/u with vascular in 1 month for staple removal ?  ?Coffee ground emesis ?Had significant coffee-ground emesis on 3/14.  ?He does state he has never had EGD or colonoscopy. ?3/20 no further episodes.  ?H&H remained stable ?We will need a GI referral on discharge  ?  ?Urinary retention ?Foley removed 3/20 with failure of voiding trial, replaced 3/21  ?   ?NSVT ?Cards consulted ?Lopressor '25mg'$  bid added, titrate as tolerated  ?Echo with nml EF. Has non obstructive CAD cath 10/22. ?  ?Physical debility ?Therapy recommended CIR, authorization pending ?  ?Hypokalemia ?Replaced and stable ?  ?Hyponatremia ?More chronic. ?We will hold HCTZ ?  ?CLL (chronic lymphocytic leukemia) ([place]) ?-Diagnosed in 11/2018.   ?-Has done well with ibrutinib, and is continuing this medication. ?-Followed by Dr. [name]. ?  ?Constipation ?Continue bowel regimen ?  ?Pacemaker ?-High grade heart block ?-Pacer in place and working well in January per [name] ?-[name] was performed in 01/2021 pre-procedure with no obvious obstructive CAD ?  ?BPH (benign prostatic hyperplasia) ?-continue terazosin. Has intermittent issues of urinary retention ?  ?Diabetes mellitus ([place]) ?-Last A1c was 6.7 ?-He is not on medications at home ?-Continue with sensitive-scale SSI ?  ?Hypertension ?-Continue ARB, amlodipine, and terazosin ?Discontinue HCTZ due to hyponatremia ?Increase amlodipine to 10 mg daily ?  ?  ?DVT prophylaxis: Heparin subcu ?Code Status: Full ?Family Communication: None at bedside ? ?Status is: Inpatient ? ?Dispo: The patient is from: Home ?             Anticipated d/c is to: CIR ?             Anticipated d/c date is: 24 to 48 hours ?             Patient currently is medically stable for discharge ? ?Consultants:  ?Vascular ? ?Procedures:  ?Right BKA ? ?Antimicrobials:  ?None ? ?Subjective: ?No acute issues or events overnight, pain well controlled ? ?Objective: ?Vitals:  ? 06/30/21 1646 06/30/21 1926 07/01/21  0010 07/01/21 0352  ?BP: (!) 124/55 (!) 122/59 (!) 120/59 (!) 116/56  ?Pulse: 80 80 80   ?Resp: '17 18 18 20  '$ ?Temp: 97.7 ?F (36.5 ?C) 97.8 ?F (36.6 ?C) 98.2 ?F (36.8 ?C) 97.9 ?F (36.6 ?C)  ?TempSrc: Oral Oral Oral Oral  ?SpO2: 98% 98% 98% 98%  ?Weight:      ?Height:      ? ? ?Intake/Output Summary (Last 24 hours) at 07/01/2021 0718 ?Last data filed at 07/01/2021 0602 ?Gross per 24 hour   ?Intake 483 ml  ?Output 2250 ml  ?Net -1767 ml  ? ? ?Filed Weights  ? 06/18/21 1200  ?Weight: 102.1 kg  ? ? ?Examination: ? ?General:  Pleasantly resting in bed, No acute distress. ?HEENT:  Normocephalic atraumatic.  Sclerae nonicteric, noninjected.  Extraocular movements intact bilaterally. ?Lungs:  Clear to auscultate bilaterally without rhonchi, wheeze, or rales. ?Heart:  Regular rate and rhythm.  Without murmurs, rubs, or gallops. ?Abdomen:  Soft, nontender, nondistended.  Without guarding or rebound. ?Extremities: Right BKA bandage clean dry intact ? ? ?Data Reviewed: I have personally reviewed following labs and imaging studies ? ?CBC: ?Recent Labs  ?Lab 06/24/21 ?0832 06/24/21 ?1645 06/25/21 ?1856 06/26/21 ?3149 06/29/21 ?0217  ?WBC 10.1  --   --  12.5* 12.6*  ?NEUTROABS  --   --   --  10.5*  --   ?HGB 10.4* 10.3* 10.0* 9.8* 10.6*  ?HCT 31.5* 31.4* 31.0* 29.7* 32.8*  ?MCV 89.7  --   --  89.7 90.6  ?PLT 370  --   --  475* 669*  ? ? ?Basic Metabolic Panel: ?Recent Labs  ?Lab 06/25/21 ?7026 06/26/21 ?0240 06/27/21 ?0107 06/27/21 ?0241 06/28/21 ?1447 06/29/21 ?0217  ?NA 131* 130* 132*  --  132* 130*  ?K 3.3* 4.2  --  4.2 4.4 4.4  ?CL 93* 97*  --   --  95* 95*  ?CO2 28 25  --   --  27 26  ?GLUCOSE 161* 150*  --   --  170* 143*  ?BUN 17 14  --   --  16 15  ?CREATININE 0.73 0.71  --   --  0.93 0.76  ?CALCIUM 7.6* 7.7*  --   --  8.4* 8.1*  ?MG  --   --   --  2.1 2.1  --   ? ? ?GFR: ?Estimated Creatinine Clearance: 103.2 mL/min (by C-G formula based on SCr of 0.76 mg/dL). ?Liver Function Tests: ?No results for input(s): AST, ALT, ALKPHOS, BILITOT, PROT, ALBUMIN in the last 168 hours. ?No results for input(s): LIPASE, AMYLASE in the last 168 hours. ?No results for input(s): AMMONIA in the last 168 hours. ?Coagulation Profile: ?No results for input(s): INR, PROTIME in the last 168 hours. ?Cardiac Enzymes: ?No results for input(s): CKTOTAL, CKMB, CKMBINDEX, TROPONINI in the last 168 hours. ?BNP (last 3 results) ?No  results for input(s): PROBNP in the last 8760 hours. ?HbA1C: ?No results for input(s): HGBA1C in the last 72 hours. ?CBG: ?Recent Labs  ?Lab 06/30/21 ?3785 06/30/21 ?1158 06/30/21 ?1644 06/30/21 ?2115 07/01/21 ?0559  ?GLUCAP 126* 179* 150* 161* 124*  ? ? ?Lipid Profile: ?No results for input(s): CHOL, HDL, LDLCALC, TRIG, CHOLHDL, LDLDIRECT in the last 72 hours. ?Thyroid Function Tests: ?No results for input(s): TSH, T4TOTAL, FREET4, T3FREE, THYROIDAB in the last 72 hours. ?Anemia Panel: ?No results for input(s): VITAMINB12, FOLATE, FERRITIN, TIBC, IRON, RETICCTPCT in the last 72 hours. ?Sepsis Labs: ?No results for input(s): PROCALCITON, LATICACIDVEN in the last  168 hours. ? ?Recent Results (from the past 240 hour(s))  ?Surgical pcr screen     Status: Abnormal  ? Collection Time: 06/21/21  8:35 PM  ? Specimen: Nasal Mucosa; Nasal Swab  ?Result Value Ref Range Status  ? MRSA, PCR NEGATIVE NEGATIVE Final  ? Staphylococcus aureus POSITIVE (A) NEGATIVE Final  ?  Comment: (NOTE) ?The Xpert SA Assay (FDA approved for NASAL specimens in patients 38 ?years of age and older), is one component of a comprehensive ?surveillance program. It is not intended to diagnose infection nor to ?guide or monitor treatment. ?Performed at [facility], [place] [address], [place], [place] ?[address] ?  ? ?  ? ? ? ? ? ?Radiology Studies: ?No results found. ? ? ? ? ? ?Scheduled Meds: ? acetaminophen  650 mg Oral Q6H  ? amLODipine  10 mg Oral Daily  ? aspirin EC  81 mg Oral Daily  ? Chlorhexidine Gluconate Cloth  6 each Topical Daily  ? docusate sodium  100 mg Oral BID  ? gabapentin  300 mg Oral TID  ? heparin injection (subcutaneous)  5,000 Units Subcutaneous Q8H  ? insulin aspart  0-5 Units Subcutaneous QHS  ? insulin aspart  0-9 Units Subcutaneous TID WC  ? irbesartan  300 mg Oral Daily  ? methocarbamol  500 mg Oral TID  ? metoprolol tartrate  25 mg Oral Q8H  ? multivitamin with minerals  1 tablet Oral Daily  ? pantoprazole  40 mg Oral  BID  ? polyethylene glycol  17 g Oral BID  ? rosuvastatin  20 mg Oral Daily  ? senna  1 tablet Oral BID  ? sodium chloride flush  3 mL Intravenous Q12H  ? sodium chloride flush  3 mL Intravenous Q12H  ? terazosin  10 mg Or

## 2021-07-01 NOTE — Progress Notes (Signed)
Inpatient Rehabilitation Admissions Coordinator  ? ?I have insurance approval and [facility] bed to admit him to today. I met with him at bedside and he is in agreement. I will make the arrangements to admit today. ? ?Danne Baxter, RN, MSN ?Rehab Admissions Coordinator ?[phone] ?07/01/2021 3:21 PM ? ?

## 2021-07-02 DIAGNOSIS — Z89511 Acquired absence of right leg below knee: Secondary | ICD-10-CM | POA: Diagnosis not present

## 2021-07-02 LAB — CBC WITH DIFFERENTIAL/PLATELET
Abs Immature Granulocytes: 0.35 10*3/uL — ABNORMAL HIGH (ref 0.00–0.07)
Basophils Absolute: 0.1 10*3/uL (ref 0.0–0.1)
Basophils Relative: 1 %
Eosinophils Absolute: 0.1 10*3/uL (ref 0.0–0.5)
Eosinophils Relative: 1 %
HCT: 31 % — ABNORMAL LOW (ref 39.0–52.0)
Hemoglobin: 9.9 g/dL — ABNORMAL LOW (ref 13.0–17.0)
Immature Granulocytes: 3 %
Lymphocytes Relative: 23 %
Lymphs Abs: 3.3 10*3/uL (ref 0.7–4.0)
MCH: 28.8 pg (ref 26.0–34.0)
MCHC: 31.9 g/dL (ref 30.0–36.0)
MCV: 90.1 fL (ref 80.0–100.0)
Monocytes Absolute: 0.5 10*3/uL (ref 0.1–1.0)
Monocytes Relative: 3 %
Neutro Abs: 9.7 10*3/uL — ABNORMAL HIGH (ref 1.7–7.7)
Neutrophils Relative %: 69 %
Platelets: 715 10*3/uL — ABNORMAL HIGH (ref 150–400)
RBC: 3.44 MIL/uL — ABNORMAL LOW (ref 4.22–5.81)
RDW: 15.9 % — ABNORMAL HIGH (ref 11.5–15.5)
WBC: 14 10*3/uL — ABNORMAL HIGH (ref 4.0–10.5)
nRBC: 0 % (ref 0.0–0.2)

## 2021-07-02 LAB — COMPREHENSIVE METABOLIC PANEL
ALT: 62 U/L — ABNORMAL HIGH (ref 0–44)
AST: 56 U/L — ABNORMAL HIGH (ref 15–41)
Albumin: 2.2 g/dL — ABNORMAL LOW (ref 3.5–5.0)
Alkaline Phosphatase: 192 U/L — ABNORMAL HIGH (ref 38–126)
Anion gap: 6 (ref 5–15)
BUN: 16 mg/dL (ref 8–23)
CO2: 29 mmol/L (ref 22–32)
Calcium: 8.3 mg/dL — ABNORMAL LOW (ref 8.9–10.3)
Chloride: 97 mmol/L — ABNORMAL LOW (ref 98–111)
Creatinine, Ser: 0.81 mg/dL (ref 0.61–1.24)
GFR, Estimated: >60 mL/min (ref >60)
Glucose, Bld: 149 mg/dL — ABNORMAL HIGH (ref 70–99)
Potassium: 4.9 mmol/L (ref 3.5–5.1)
Sodium: 132 mmol/L — ABNORMAL LOW (ref 135–145)
Total Bilirubin: 1 mg/dL (ref 0.3–1.2)
Total Protein: 5.2 g/dL — ABNORMAL LOW (ref 6.5–8.1)

## 2021-07-02 LAB — GLUCOSE, CAPILLARY
Glucose-Capillary: 146 mg/dL — ABNORMAL HIGH (ref 70–99)
Glucose-Capillary: 162 mg/dL — ABNORMAL HIGH (ref 70–99)
Glucose-Capillary: 149 mg/dL — ABNORMAL HIGH (ref 70–99)
Glucose-Capillary: 197 mg/dL — ABNORMAL HIGH (ref 70–99)

## 2021-07-02 MED ORDER — GABAPENTIN 100 MG PO CAPS
100.0000 mg | ORAL_CAPSULE | Freq: Every day | ORAL | Status: DC
  Administered 2021-07-02 – 2021-07-06 (×5): 100 mg via ORAL
  Filled 2021-07-02 (×5): qty 1

## 2021-07-02 MED ORDER — CHLORHEXIDINE GLUCONATE CLOTH 2 % EX PADS
6.0000 | MEDICATED_PAD | Freq: Every day | CUTANEOUS | Status: DC
  Administered 2021-07-02 – 2021-07-04 (×3): 6 via TOPICAL

## 2021-07-02 MED ORDER — IBRUTINIB 420 MG PO TABS
420.0000 mg | ORAL_TABLET | Freq: Every day | ORAL | Status: DC
  Administered 2021-07-02 – 2021-07-18 (×17): 420 mg via ORAL
  Filled 2021-07-02 (×19): qty 1

## 2021-07-02 MED ORDER — HOME MED STORE IN PYXIS: 1.0000 | Freq: Two times a day (BID) | Status: DC | PRN

## 2021-07-02 NOTE — H&P (Signed)
? ? ?Physical Medicine and Rehabilitation Admission H&P ? ?  ?CC: Right BKA ? ?HPI: Douglas [name] is a 71 year old right-handed male with history of BPH, hypertension, CAD with pacemaker 01/19/2021 per Dr. Osie Cheeks, history of tobacco use, CLL diagnosed 11/2018 followed by Dr. Alen Blew.  Per chart review patient lives with spouse.  Two-level home 3 steps to entry.  Independent driving prior to admission.  Wife works from home.  Presented 06/18/2021 with right foot swelling ischemic change as well as rest pain.  Patient had recently been treated for plantar fasciitis and metatarsal condition with podiatry 2 weeks ago.  Vascular surgery consulted underwent angiogram that showed the aorta and iliac segments free of flow-limiting stenosis.  Bilateral renal arteries patent.  Right lower extremity SFA occludes at the takeoff.  He reconstitutes above the knee where it is diseased and below the knee noted to be healthier.  Patient underwent right common femoral to below-knee popliteal artery bypass with nonreversed ipsilateral translocated greater saphenous vein with right posterior tibial ankle exposure and thrombectomy 06/22/2021 per Dr. Donzetta Matters.  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 Donzetta Matters.  Nonweightbearing right lower extremity.  Placed on subcutaneous heparin for DVT prophylaxis.  Developed episode of NSVT postoperatively cardiology service was consulted with echocardiogram completed showing ejection fraction of 50 to 55% the left ventricle demonstrated regional wall motion abnormality.  He was placed on Lopressor twice daily.  Acute blood loss anemia 10.6 and monitored.  Patient did have an episode of coffee-ground emesis 3/14 and placed on Protonix twice daily.  He did have bouts of urinary retention requiring a Foley catheter tube with planned voiding trial.  Patient was asymptomatic and close monitoring of  hemoglobin/hematocrit.  Therapy evaluations completed due to patient decreased functional mobility was admitted for a comprehensive rehab program. Currently with phantom limb pain. ? ?Review of Systems  ?Constitutional:  Negative for chills and fever.  ?HENT:  Negative for hearing loss.   ?Eyes:  Negative for blurred vision and double vision.  ?Respiratory:  Negative for cough and shortness of breath.   ?Cardiovascular:  Positive for palpitations and leg swelling. Negative for chest pain.  ?Gastrointestinal:  Positive for constipation. Negative for heartburn, nausea and vomiting.  ?Genitourinary:  Positive for urgency. Negative for dysuria, flank pain and hematuria.  ?Musculoskeletal:  Positive for joint pain and myalgias.  ?Skin:  Negative for rash.  ?All other systems reviewed and are negative. ?Past Medical History:  ?Diagnosis Date  ? BPH (benign prostatic hyperplasia)   ? CLL (chronic lymphocytic leukemia) ([place])   ? Hypertension   ? Pacemaker   ? Medtronic Device  ? ?Past Surgical History:  ?Procedure Laterality Date  ? ABDOMINAL AORTOGRAM W/LOWER EXTREMITY Bilateral 06/21/2021  ? Procedure: ABDOMINAL AORTOGRAM W/LOWER EXTREMITY;  Surgeon: Waynetta Sandy, MD;  Location: [place] [facility];  Service: Cardiovascular;  Laterality: Bilateral;  ? AMPUTATION Right 06/25/2021  ? Procedure: RIGHT BELOW KNEE AMPUTATION;  Surgeon: Waynetta Sandy, MD;  Location: [place];  Service: Vascular;  Laterality: Right;  ? FEMORAL-POPLITEAL BYPASS GRAFT Right 06/22/2021  ? Procedure: RIGHT FEMORAL-POPLITEAL BYPASS WITH VEIN, RIGHT POPLITEAL THROMBECTOMY;  Surgeon: Waynetta Sandy, MD;  Location: [place];  Service: Vascular;  Laterality: Right;  ? LEFT HEART CATH AND CORONARY ANGIOGRAPHY N/A 01/18/2021  ? Procedure: LEFT HEART CATH AND CORONARY ANGIOGRAPHY;  Surgeon: Burnell Blanks, MD;  Location: [place] [facility];  Service:  Cardiovascular;  Laterality: N/A;  ? PACEMAKER IMPLANT N/A 01/19/2021  ?  Procedure: PACEMAKER IMPLANT;  Surgeon: Evans Lance, MD;  Location: [place] [facility];  Service: Cardiovascular;  Laterality: N/A;  ? ?Family History  ?Problem Relation Age of Onset  ? Lung cancer Mother   ? Coronary artery disease Father   ? ?Social History:  reports that he has quit smoking. His smoking use included cigarettes. He has a 5.00 pack-year smoking history. He has never used smokeless tobacco. He reports that he does not currently use alcohol. He reports that he does not use drugs. ?Allergies: Not on File ?Medications Prior to Admission  ?Medication Sig Dispense Refill  ? albuterol (PROVENTIL HFA;VENTOLIN HFA) 108 (90 BASE) MCG/ACT inhaler Inhale 2 puffs into the lungs 4 (four) times daily. (Patient taking differently: Inhale 2 puffs into the lungs every 6 (six) hours as needed for shortness of breath or wheezing.) 1 Inhaler 0  ? amLODipine (NORVASC) 10 MG tablet Take 1 tablet (10 mg total) by mouth daily. 30 tablet 0  ? aspirin EC 81 MG EC tablet Take 1 tablet (81 mg total) by mouth daily. Swallow whole. 30 tablet 11  ? gabapentin (NEURONTIN) 300 MG capsule Take 1 capsule (300 mg total) by mouth 3 (three) times daily. 90 capsule 0  ? IMBRUVICA 420 MG tablet TAKE 1 TABLET BY MOUTH ONCE  DAILY WITH A FULL GLASS OF WATER (Patient taking differently: 420 mg daily. WITH A FULL GLASS OF  WATER) 28 tablet 1  ? irbesartan (AVAPRO) 300 MG tablet Take 1 tablet (300 mg total) by mouth daily. 30 tablet 0  ? Lidocaine 4 % LOTN Apply 1 application. topically daily as needed (pain).    ? methocarbamol (ROBAXIN) 500 MG tablet Take 1 tablet (500 mg total) by mouth 3 (three) times daily. 90 tablet 0  ? metoprolol tartrate (LOPRESSOR) 25 MG tablet Take 1 tablet (25 mg total) by mouth 2 (two) times daily. 60 tablet 0  ? montelukast (SINGULAIR) 10 MG tablet Take 10 mg by mouth daily as needed (allergies).    ? oxymetazoline (AFRIN) 0.05 % nasal spray Place 1 spray into both nostrils 2 (two) times daily as needed for  congestion.    ? pantoprazole (PROTONIX) 40 MG tablet Take 1 tablet (40 mg total) by mouth 2 (two) times daily. 60 tablet 0  ? rosuvastatin (CRESTOR) 20 MG tablet Take 1 tablet (20 mg total) by mouth daily. 30 tablet 0  ? terazosin (HYTRIN) 10 MG capsule Take 10 mg by mouth at bedtime.    ? ?Home: ?Home Living ?Family/patient expects to be discharged to:: Private residence ?Living Arrangements: Spouse/significant other ?Available Help at Discharge: Family, Available 24 hours/day (wife works for [employer] remotely from home) ?Type of Home: House ?Home Access: Stairs to enter ?Entrance Stairs-Number of Steps: 3 ?Entrance Stairs-Rails: Left ?Home Layout: 1/2 bath on main level, Bed/bath upstairs, Two level (home to go on market with plans to move to a one level apartment May 2023) ?Alternate Level Stairs-Number of Steps: 15 ?Alternate Level Stairs-Rails: Can reach both ?Bathroom Shower/Tub: Walk-in shower (upstairs; 1/2 bath down stairs) ?Bathroom Toilet: Standard ?Bathroom Accessibility: Yes ?Home Equipment: None ?Additional Comments: Was independent and still driving, retired ? Lives With: Spouse ?  ?Functional History: ?Prior Function ?Prior Level of Function : Independent/Modified Independent ?Mobility Comments: Independent and driving ?ADLs Comments: Independent ?  ?Functional Status:  ?Mobility: ?Bed Mobility ?Overal bed mobility: Needs Assistance ?Bed Mobility: Supine to Sit ?Rolling: Min assist ?  Sidelying to sit: Min assist ?Supine to sit: HOB elevated, Min assist, +2 for safety/equipment ?Sit to supine: Min assist ?Sit to sidelying: Min assist ?General bed mobility comments: good effort on pt's part with Min A to lift trunk using bedrail. pt able to use bedrails to push/pull self up in bed relatively well, cues for improved technique. close to min guard ?Transfers ?Overall transfer level: Needs assistance ?Equipment used: Ambulation equipment used, Rolling walker (2 wheels) ?Transfers: Sit to/from Stand, Bed to  chair/wheelchair/BSC ?Sit to Stand: Min assist, +2 safety/equipment ?Bed to/from chair/wheelchair/BSC transfer type:: Via Lift equipment ?Transfer via Lift Equipment: Stedy ?General transfer comment: Pt actually able to p

## 2021-07-02 NOTE — Progress Notes (Signed)
Occupational Therapy Session Note ? ?Patient Details  ?Name: Douglas [name] ?MRN: [id number] ?Date of Birth: 1950/07/21 ? ?Today's Date: 07/02/2021 ?OT Individual Time: 1450-1530 ?OT Individual Time Calculation (min): 40 min  ? ? ?Short Term Goals: ?Week 1:  OT Short Term Goal 1 (Week 1): Patient will don LB clothing with Min A and lateral leans. ?OT Short Term Goal 2 (Week 1): Patient will complete 2/3 parts of toileting task with Min A and LRAD. ?OT Short Term Goal 3 (Week 1): Patient will completed 2/3 parts of toileting task with with Min A and LRAD. ? ?Skilled Therapeutic Interventions/Progress Updates:  ?Skilled OT intervention completed with focus on amputation education. Pt received upright in bed, reporting exhaustion from earlier sessions initially declining session however agreeable to limb education.  ? ?Note- therapist noticed pt's catheter bag full of dark purple substance, with pt reporting his urine, despite cancer meds, has never appeared that way and reported it wasn't that color until after last therapy session. Therapist notified nurse of potential bleeding with nurse confirming she would check in as able. ? ?Education provided on the following: ?Skin inspection with therapist issuing pt a mirror ?Signs of infection ?Wound care/hygiene, importance and frequency ?Contracture prevention  ?Positioning for pain management ?Purpose of shrinker/wearing presentation ? ?Pt reported "I don't want to look at my limb, and I don't care anything about a prosthetic." Therapist provided emotional support for pt as he seems to be struggling with coping with his current situation, however further provided education on importance of taking care of it vs ignoring it so he doesn't have to have revision surgery. Pt able to use LLE to boost self up towards [facility] with bed in trendelenburg with min A. Therapist rechecked in with nurse at conclusion of session to encourage a visual check on pt's catheter. Pt was left upright  in bed, with bed alarm on and all needs in reach at end of session. ? 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 ?Pain: ?Unrated pain in residual limb, premedicated. Repositioned for contracture prevention ? ? ?Therapy/Group: Individual Therapy ? ?[name] [name] ?07/02/2021, 7:42 AM ?

## 2021-07-02 NOTE — Progress Notes (Signed)
Inpatient Rehabilitation Center ?Individual Statement of Services ? ?Patient Name:  Douglas [name]  ?Date:  07/02/2021 ? ?Welcome to the [facility].  Our goal is to provide you with an individualized program based on your diagnosis and situation, designed to meet your specific needs.  With this comprehensive rehabilitation program, you will be expected to participate in at least 3 hours of rehabilitation therapies Monday-Friday, with modified therapy programming on the weekends. ? ?Your rehabilitation program will include the following services:  Physical Therapy (PT), Occupational Therapy (OT), Speech Therapy (ST), 24 hour per day rehabilitation nursing, Therapeutic Recreaction (TR), Neuropsychology, Care Coordinator, Rehabilitation Medicine, Nutrition Services, Pharmacy Services, and Other ? ?Weekly team conferences will be held on Wednesdays to discuss your progress.  Your Inpatient Rehabilitation Care Coordinator will talk with you frequently to get your input and to update you on team discussions.  Team conferences with you and your family in attendance may also be held. ? ?Expected length of stay: 10-14 Days  Overall anticipated outcome:  MOD I to Supervision ? ?Depending on your progress and recovery, your program may change. Your Inpatient Rehabilitation Care Coordinator will coordinate services and will keep you informed of any changes. Your Inpatient Rehabilitation Care Coordinator's name and contact numbers are listed  below. ? ?The following services may also be recommended but are not provided by the [facility]:  ? ?Home Health Rehabiltiation Services ?Outpatient Rehabilitation Services ? ?Arrangements will be made to provide these services after discharge if needed.  Arrangements include referral to agencies that provide these services. ? ?Your insurance has been verified to be:   [employer] COMM ?Your primary doctor is:  [name], MD ? ?Pertinent information  will be shared with your doctor and your insurance company. ? ?Inpatient Rehabilitation Care Coordinator:  Erlene Quan, [place] or [phone] ? ?Information discussed with and copy given to patient by: Dyanne Iha, 07/02/2021, 10:11 AM    ?

## 2021-07-02 NOTE — Progress Notes (Signed)
Inpatient Rehabilitation Admission Medication Review by a Pharmacist ? ?A complete drug regimen review was completed for this patient to identify any potential clinically significant medication issues. ? ?High Risk Drug Classes Is patient taking? Indication by Medication  ?Antipsychotic No   ?Anticoagulant Yes Sq heparin for VTE ppx  ?Antibiotic No   ?Opioid Yes Oxycodone prn for pain  ?Antiplatelet No   ?Hypoglycemics/insulin Yes SSI for DM  ?Vasoactive Medication Yes Norvasc, Avapro, metoprolol for BP  ?Chemotherapy Yes, Oral Chemotherapy Imbruvica for CLL  ?Other Yes Terazsoin for BPH ?Crestor for HLD ?Robaxin for muscle spasms ?Gabapentin for pain ?Protonix for GERD  ? ? ? ?Type of Medication Issue Identified Description of Issue Recommendation(s)  ?Drug Interaction(s) (clinically significant) ?    ?Duplicate Therapy ?    ?Allergy ?    ?No Medication Administration End Date ?    ?Incorrect Dose ?    ?Additional Drug Therapy Needed ?    ?Significant med changes from prior encounter (inform family/care partners about these prior to discharge).    ?Other ?    ? ? ?Clinically significant medication issues were identified that warrant physician communication and completion of prescribed/recommended actions by midnight of the next day:  No ? ?Pharmacist comments: None ? ?Time spent performing this drug regimen review (minutes):  20 minutes ? ? ?Tad Moore ?07/02/2021 8:06 AM ?

## 2021-07-02 NOTE — Progress Notes (Signed)
Inpatient Rehabilitation  Patient information reviewed and entered into eRehab system by Ellesse Antenucci Johnanna Bakke, OTR/L.   Information including medical coding, functional ability and quality indicators will be reviewed and updated through discharge.    

## 2021-07-02 NOTE — Progress Notes (Signed)
[name], MD  ?Physician ?Physical Medicine and Rehabilitation ?PMR Pre-admission     ?Signed ?Date of Service:  06/30/2021  3:43 PM ? Related encounter: ED to Hosp-Admission (Discharged) from 06/18/2021 in [facility]E CV SURGICAL PROGRESSIVE CARE ?  ?Signed    ?  ?Show:Clear all ?'[x]'$ Written'[x]'$ Templated'[x]'$ Copied ? ?Added by: ?'[x]'$ Ardyce Heyer, Vertis Kelch, RN'[x]'$ [name], MD ? ?'[]'$ Hover for details ?   ?   ?   ?   ?   ?   ?   ?   ?   ?   ?   ?   ?   ?   ?   ?   ?   ?   ?   ?   ?   ?   ?   ?   ?   ?   ?   ?   ?   ?   ?   ?   ?   ?   ?   ?   ?   ?   ?   ?   ?   ?   ?   ?   ?   ?   ?   ?   ?   ?   ?   ?   ?   ?   ?   ?   ?   ?   ?   ?   ?   ?   ?   ?   ?   ?   ?   ?   ?   ?   ?   ?   ?   ?   ?   ?   ?   ?   ?   ?   ?   ?   ?   ?   ?   ?   ?   ?   ?   ?   ?   ?   ?   ?   ?   ?   ?   ?   ?   ?   ?   ?   ?   ?   ?   ?   ?   ?   ?   ?   ?   ?   ?   ?   ?   ?   ?   ?   ?   ?   ?   ?   ?   ?   ?   ?   ?   ?   ?   ?   ?   ?   ?   ?   ?   ?   ?   ?   ?   ?   ?   ?PMR Admission Coordinator Pre-Admission Assessment ?  ?Patient: Douglas [name] is an 71 y.o., male ?MRN: [id number] ?DOB: 1950/05/26 ?Height: 6' (182.9 cm) ?Weight: 102.1 kg ?  ?Insurance Information ?HMO:     PPO:      PCP:      IPA:      80/20:      OTHER:  ?PRIMARY: [employer] commercial      Policy#: [id number]      Subscriber: spouse ?CM Name: approved via notification of approval in portal and verified by calling [phone] with [facility]  reference # of call [id number]       Phone#: [phone]  Fax#: [phone] ?Pre-Cert#: [id number]  approved for 7 days    Employer: [employer] ?Benefits:  Phone #: [phone]     Name: 3/21 ?Eff. Date: 04/11/2021     Deduct: $3000      Out of Pocket Max: $4500      Life Max: none ?CIR: 80%      SNF: 80% 120 days ?Outpatient: 80%     Co-Pay:  ?Home Health: 80%      Co-Pay: 60 visits combined ?DME: 80%     Co-Pay: 20% ?Providers: in network ? ?SECONDARY: Medicare part A only      Policy#: [id number] ?  ?Financial  Counselor:       Phone#:  ?  ?The ?Data Collection Information Summary? for patients in Inpatient Rehabilitation Facilities with attached ?Privacy Act [place] Records? was provided and verbally reviewed with: Patient ?  ?Emergency Contact Information ?Contact Information   ?  ?  Name Relation Home Work Mobile  ?  [place]   [phone]  ?  ?   ?  ?Current Medical History  ?Patient Admitting Diagnosis: BKA ?  ?History of Present Illness:  71 year old right-handed male with history of BPH, hypertension, CAD with pacemaker 01/19/2021 per Dr. Osie Cheeks, history of tobacco use, CLL diagnosed 11/2018 followed by Dr. Alen Blew.  Presented 06/18/2021 with right foot swelling ischemic change as well as rest pain.  Patient had recently been treated for plantar fasciitis and metatarsal condition with podiatry 2 weeks ago.  Vascular surgery consulted underwent angiogram that showed the aorta and iliac segments free of flow-limiting stenosis.  Bilateral renal arteries patent.  Right lower extremity SFA occludes at the takeoff.  He reconstitutes above the knee where it is diseased and below the knee noted to be healthier.  Patient underwent right common femoral to below-knee popliteal artery bypass with non reversed ipsilateral translocated greater saphenous vein with right posterior tibial ankle exposure and thrombectomy 06/22/2021 per Dr. Donzetta Matters.  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 Donzetta Matters.  Nonweightbearing right lower extremity.  Placed on subcutaneous heparin for DVT prophylaxis.  Developed episode of NSVT postoperatively cardiology service was consulted with echocardiogram completed showing ejection fraction of 50 to 55% the left ventricle demonstrated regional wall motion abnormality.  He was placed on Lopressor twice daily.  Acute blood loss anemia 10.6 and monitored.  Patient did have an  episode of coffee-ground emesis 3/14 and placed on Protonix twice daily.  He did have bouts of urinary retention requiring a Foley catheter tube with planned voiding trial.  Patient was asymptomatic and close monitoring of hemoglobin/hematocrit.  ?  ?Patient's medical record from [facility] has been reviewed by the rehabilitation admission coordinator and physician. ?  ?Past Medical History  ?    ?Past Medical History:  ?Diagnosis Date  ? BPH (benign prostatic hyperplasia)    ? CLL (chronic lymphocytic leukemia) ([place])    ? Hypertension    ? Pacemaker    ?  Medtronic Device  ?  ?Has the patient had major surgery during 100 days prior to admission? Yes ?  ?Family History   ?family history includes Coronary artery disease in his father; Lung cancer in his mother. ?  ?Current Medications ?  ?Current Facility-Administered Medications:  ?  0.9 %  sodium chloride infusion, 250 mL, Intravenous, PRN, Baglia, Corrina, PA-C ?  0.9 %  sodium chloride infusion, 500 mL, Intravenous,  Once PRN, Baglia, Corrina, PA-C ?  acetaminophen (TYLENOL) tablet 650 mg, 650 mg, Oral, Q6H, Cherre Robins, MD, 650 mg at 07/01/21 1126 ?  albuterol (PROVENTIL) (2.5 MG/3ML) 0.083% nebulizer solution 3 mL, 3 mL, Inhalation, Q6H PRN, Baglia, Corrina, PA-C ?  alum & mag hydroxide-simeth (MAALOX/MYLANTA) 200-200-20 MG/5ML suspension 30 mL, 30 mL, Oral, Q6H PRN, Baglia, Corrina, PA-C, 30 mL at 06/21/21 1636 ?  amLODipine (NORVASC) tablet 10 mg, 10 mg, Oral, Daily, Nolberto Hanlon, MD, 10 mg at 07/01/21 9038 ?  aspirin EC tablet 81 mg, 81 mg, Oral, Daily, Baglia, Corrina, PA-C, 81 mg at 07/01/21 0916 ?  bisacodyl (DULCOLAX) EC tablet 5 mg, 5 mg, Oral, Daily PRN, Baglia, Corrina, PA-C, 5 mg at 06/28/21 1051 ?  Chlorhexidine Gluconate Cloth 2 % PADS 6 each, 6 each, Topical, Daily, Nolberto Hanlon, MD, 6 each at 07/01/21 1046 ?  docusate sodium (COLACE) capsule 100 mg, 100 mg, Oral, BID, Baglia, Corrina, PA-C, 100 mg at 07/01/21 3338 ?  gabapentin  (NEURONTIN) capsule 300 mg, 300 mg, Oral, TID, Nolberto Hanlon, MD, 300 mg at 07/01/21 0915 ?  guaiFENesin-dextromethorphan (ROBITUSSIN DM) 100-10 MG/5ML syrup 15 mL, 15 mL, Oral, Q4H PRN, Baglia, Corrina, PA-C ?  heparin injection 5,000 Units, 5,000 Units, Subcutaneous, Q8H, Baglia, Corrina, PA-C, 5,000 Units at 07/01/21 1353 ?  hydrALAZINE (APRESOLINE) injection 5 mg, 5 mg, Intravenous, Q4H PRN, Baglia, Corrina, PA-C ?  hydrALAZINE (APRESOLINE) injection 5 mg, 5 mg, Intravenous, Q20 Min PRN, Baglia, Corrina, PA-C ?  insulin aspart (novoLOG) injection 0-5 Units, 0-5 Units, Subcutaneous, QHS, Baglia, Corrina, PA-C ?  insulin aspart (novoLOG) injection 0-9 Units, 0-9 Units, Subcutaneous, TID WC, Baglia, Corrina, PA-C, 1 Units at 07/01/21 1128 ?  irbesartan (AVAPRO) tablet 300 mg, 300 mg, Oral, Daily, Baglia, Corrina, PA-C, 300 mg at 07/01/21 3291 ?  labetalol (NORMODYNE) injection 10 mg, 10 mg, Intravenous, Q10 min PRN, Baglia, Corrina, PA-C ?  magnesium sulfate IVPB 2 g 50 mL, 2 g, Intravenous, Daily PRN, Baglia, Corrina, PA-C ?  methocarbamol (ROBAXIN) tablet 500 mg, 500 mg, Oral, TID, Cherre Robins, MD, 500 mg at 07/01/21 9166 ?  metoprolol tartrate (LOPRESSOR) injection 2-5 mg, 2-5 mg, Intravenous, Q2H PRN, Baglia, Corrina, PA-C ?  metoprolol tartrate (LOPRESSOR) tablet 25 mg, 25 mg, Oral, Q8H, Shirley Friar, PA-C, 25 mg at 07/01/21 1353 ?  montelukast (SINGULAIR) tablet 10 mg, 10 mg, Oral, Daily PRN, Baglia, Corrina, PA-C ?  morphine (PF) 2 MG/ML injection 2 mg, 2 mg, Intravenous, Q2H PRN, Baglia, Corrina, PA-C, 2 mg at 06/30/21 1930 ?  multivitamin with minerals tablet 1 tablet, 1 tablet, Oral, Daily, Baglia, Corrina, PA-C, 1 tablet at 07/01/21 0916 ?  ondansetron (ZOFRAN) tablet 4 mg, 4 mg, Oral, Q6H PRN, 4 mg at 06/19/21 0800 **OR** ondansetron (ZOFRAN) injection 4 mg, 4 mg, Intravenous, Q6H PRN, Baglia, Corrina, PA-C, 4 mg at 06/21/21 2354 ?  ondansetron (ZOFRAN) injection 4 mg, 4 mg, Intravenous,  Q6H PRN, Baglia, Corrina, PA-C, 4 mg at 06/25/21 1857 ?  oxyCODONE (Oxy IR/ROXICODONE) immediate release tablet 5 mg, 5 mg, Oral, Q4H PRN, Baglia, Corrina, PA-C, 5 mg at 07/01/21 1126 ?  pantoprazole (PROTO

## 2021-07-02 NOTE — Progress Notes (Signed)
[facility] ? ?Subjective  - Worried about bleeding. ? ? ?Objective ?(!) 128/59 ?83 ?98.1 ?F (36.7 ?C) (Oral) ?18 ?97% ? ?Intake/Output Summary (Last 24 hours) at 07/02/2021 1017 ?Last data filed at 07/02/2021 0600 ?Gross per 24 hour  ?Intake 820 ml  ?Output 1900 ml  ?Net -1080 ml  ? ? ?Right BKA SS/bloody drainage from medial incision. ?Placed dry guaze over incision covered with stump sock ?With placement of stump sock the anterior tibial blister popped.  Will not de roof blister. ?Lungs non labored breathing ? ?Assessment/Planning: ?S/P right BKA after failed bypass with non salvable right limb ? ?Dry guaze until no drainage under stump sock ?Stable disposition ? ? ?Roxy Horseman ?07/02/2021 ?10:17 AM ?-- ? ?Laboratory ?Lab Results: ?Recent Labs  ?  07/02/21 ?9371  ?WBC 14.0*  ?HGB 9.9*  ?HCT 31.0*  ?PLT 715*  ? ?BMET ?Recent Labs  ?  07/02/21 ?6967  ?NA 132*  ?K 4.9  ?CL 97*  ?CO2 29  ?GLUCOSE 149*  ?BUN 16  ?CREATININE 0.81  ?CALCIUM 8.3*  ? ? ?COAG ?Lab Results  ?Component Value Date  ? INR 1.0 06/18/2021  ? INR 1.0 01/16/2021  ? ?No results found for: PTT ? ? ? ?

## 2021-07-02 NOTE — Progress Notes (Signed)
Dark red urine noted to drainage bag. Notified  ?Lauraine Rinne, PA-C. No new orders at this time. ? ? ? ?

## 2021-07-02 NOTE — Evaluation (Signed)
Physical Therapy Assessment and Plan ? ?Patient Details  ?Name: Douglas [name] ?MRN: [id number] ?Date of Birth: 1951/03/17 ? ?PT Diagnosis: Abnormality of gait, Muscle weakness, and Pain in residual limb ?Rehab Potential: Fair ?ELOS: 14-17 days  ? ?Today's Date: 07/02/2021 ?PT Individual Time: 1300-1400 ?PT Individual Time Calculation (min): 60 min   ? ?Hospital Problem: Principal Problem: ?  Right below-knee amputee [facility]) ? ? ?Past Medical History:  ?Past Medical History:  ?Diagnosis Date  ? BPH (benign prostatic hyperplasia)   ? CLL (chronic lymphocytic leukemia) ([place])   ? Hypertension   ? Pacemaker   ? Medtronic Device  ? ?Past Surgical History:  ?Past Surgical History:  ?Procedure Laterality Date  ? ABDOMINAL AORTOGRAM W/LOWER EXTREMITY Bilateral 06/21/2021  ? Procedure: ABDOMINAL AORTOGRAM W/LOWER EXTREMITY;  Surgeon: Waynetta Sandy, MD;  Location: [place] CV LAB;  Service: Cardiovascular;  Laterality: Bilateral;  ? AMPUTATION Right 06/25/2021  ? Procedure: RIGHT BELOW KNEE AMPUTATION;  Surgeon: Waynetta Sandy, MD;  Location: [place];  Service: Vascular;  Laterality: Right;  ? FEMORAL-POPLITEAL BYPASS GRAFT Right 06/22/2021  ? Procedure: RIGHT FEMORAL-POPLITEAL BYPASS WITH VEIN, RIGHT POPLITEAL THROMBECTOMY;  Surgeon: Waynetta Sandy, MD;  Location: [place];  Service: Vascular;  Laterality: Right;  ? LEFT HEART CATH AND CORONARY ANGIOGRAPHY N/A 01/18/2021  ? Procedure: LEFT HEART CATH AND CORONARY ANGIOGRAPHY;  Surgeon: Burnell Blanks, MD;  Location: [place] [facility] LAB;  Service: Cardiovascular;  Laterality: N/A;  ? PACEMAKER IMPLANT N/A 01/19/2021  ? Procedure: PACEMAKER IMPLANT;  Surgeon: Evans Lance, MD;  Location: [place] [facility];  Service: Cardiovascular;  Laterality: N/A;  ? ? ?Assessment & Plan ?Clinical Impression: .  Patient tis a 71 year old right-handed male with history of BPH, hypertension, CAD with pacemaker 01/19/2021 per Dr. Osie Cheeks, history of  tobacco use, CLL diagnosed 11/2018 followed by Dr. Alen Blew.  Presented 06/18/2021 with right foot swelling ischemic change as well as rest pain.  Patient had recently been treated for plantar fasciitis and metatarsal condition with podiatry 2 weeks ago.  Vascular surgery consulted underwent angiogram that showed the aorta and iliac segments free of flow-limiting stenosis.  Bilateral renal arteries patent.  Right lower extremity SFA occludes at the takeoff.  He reconstitutes above the knee where it is diseased and below the knee noted to be healthier.  Patient underwent right common femoral to below-knee popliteal artery bypass with non reversed ipsilateral translocated greater saphenous vein with right posterior tibial ankle exposure and thrombectomy 06/22/2021 per Dr. Donzetta Matters.  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 Donzetta Matters.  Nonweightbearing right lower extremity.  Placed on subcutaneous heparin for DVT prophylaxis.  Developed episode of NSVT postoperatively cardiology service was consulted with echocardiogram completed showing ejection fraction of 50 to 55% the left ventricle demonstrated regional wall motion abnormality.  He was placed on Lopressor twice daily.  Acute blood loss anemia 10.6 and monitored.  Patient did have an episode of coffee-ground emesis 3/14 and placed on Protonix twice daily.  He did have bouts of urinary retention requiring a Foley catheter tube with planned voiding trial.  Patient was asymptomatic and close monitoring of d to CIR on 07/01/2021 .  ? ?Patient currently requires max with mobility secondary to muscle weakness and muscle joint tightness, decreased cardiorespiratoy endurance, phantom limb pain, and decreased sitting balance, decreased standing balance, and decreased postural control.  Prior to hospitalization, patient was independent  with  mobility and lived with Spouse in a House home.  Home access  is 3Stairs to enter. ? ?Patient will benefit from skilled PT intervention to maximize safe functional mobility, minimize fall risk, and decrease caregiver burden for planned discharge home with 24 hour assist.  Anticipate patient will benefit from follow up [facility] at discharge. ? ?PT - End of Session ?Activity Tolerance: Decreased this session (Pt required encouragement, education on rehab expectations) ?Endurance Deficit: Yes ?Endurance Deficit Description: Quick to fatigue; requires frequent rest breaks, somewhat self limiting ?PT Assessment ?Rehab Potential (ACUTE/IP ONLY): Fair ?PT Barriers to Discharge: Home environment access/layout;Weight;Weight bearing restrictions;Behavior ?PT Barriers to Discharge Comments: self limiting behaviour at eval ?PT Patient demonstrates impairments in the following area(s): Balance;Behavior;Endurance;Motor;Pain;Safety;Skin Integrity ?PT Transfers Functional Problem(s): Bed Mobility;Bed to Chair;Car;Furniture ?PT Locomotion Functional Problem(s): Ambulation;Wheelchair Mobility;Stairs ?PT Plan ?PT Intensity: Minimum of 1-2 x/day ,45 to 90 minutes ?PT Frequency: 5 out of 7 days ?PT Duration Estimated Length of Stay: 14-17 days ?PT Treatment/Interventions: Ambulation/gait training;DME/adaptive equipment instruction;Neuromuscular re-education;Psychosocial support;Stair training;UE/LE Strength taining/ROM;Wheelchair propulsion/positioning;Balance/vestibular training;Discharge planning;Pain management;Skin care/wound management;Therapeutic Activities;UE/LE Coordination activities;Disease management/prevention;Functional mobility training;Patient/family education;Splinting/orthotics;Therapeutic Exercise ?PT Transfers Anticipated Outcome(s): mod I ?PT Locomotion Anticipated Outcome(s): household min assist ?PT Recommendation ?Follow Up Recommendations: Home health PT ?Patient destination: Home ?Equipment Recommended: To be determined ? ? ?PT  Evaluation ?Precautions/Restrictions ?Precautions ?Precautions: Fall ?Precaution Comments: R BKA 3/17 ?Required Braces or Orthoses: Other Brace ?Other Brace: limb guard ?Restrictions ?Weight Bearing Restrictions: Yes ?RLE Weight Bearing: Non weight bearing ?General ?  Vital SignsTherapy Vitals ?Temp: 98.2 ?F (36.8 ?C) ?Temp Source: Oral ?Pulse Rate: 92 ?Resp: 16 ?BP: (!) 124/59 ?Patient Position (if appropriate): Sitting ?Oxygen Therapy ?SpO2: 95 % ?O2 Device: Room Air ?Pain ?Pain Assessment ?Pain Scale: 0-10 ?Pain Score: 1  (at rest) ?Pain Interference ?Pain Interference ?Pain Effect on Sleep: 4. Almost constantly ?Pain Interference with Therapy Activities: 2. Occasionally ?Pain Interference with Day-to-Day Activities: 1. Rarely or not at all ?Home Living/Prior Functioning ?Home Living ?Available Help at Discharge: Family;Available 24 hours/day ?Type of Home: House ?Home Access: Stairs to enter ?Entrance Stairs-Number of Steps: 3 ?Entrance Stairs-Rails: Left ?Home Layout: 1/2 bath on main level;Bed/bath upstairs;Two level ?Alternate Level Stairs-Number of Steps: 15 ?Bathroom Shower/Tub: Other (comment) (plans to spongebathe) ?Bathroom Toilet: Standard ?Bathroom Accessibility: Yes ?Additional Comments: Was independent and still driving, retired ? Lives With: Spouse ?Prior Function ?Level of Independence: Independent with gait;Independent with transfers ? Able to Take Stairs?: Yes ?Driving: Yes ?Vocation: Retired ?Vision/Perception  ?Vision - History ?Ability to See in Adequate Light: 0 Adequate ?Perception ?Perception: Within Functional Limits ?Praxis ?Praxis: Intact  ?Cognition ?Overall Cognitive Status: Within Functional Limits for tasks assessed ?Arousal/Alertness: Awake/alert ?Orientation Level: Oriented X4 ?Memory: Appears intact ?Awareness: Appears intact ?Problem Solving: Appears intact ?Safety/Judgment: Appears intact ?Sensation ?Sensation ?Light Touch: Appears Intact ?Hot/Cold: Appears  Intact ?Coordination ?Gross Motor Movements are Fluid and Coordinated: Yes ?Fine Motor Movements are Fluid and Coordinated: No (limited by dupetryns) ?Finger Nose Finger Test: Unable to assess 2/2 limited AROM in bilateral shoulders and hands at baseline ?Motor  ?Motor ?M

## 2021-07-02 NOTE — Evaluation (Signed)
Occupational Therapy Assessment and Plan ? ?Patient Details  ?Name: Douglas [name] ?MRN: [id number] ?Date of Birth: Feb 05, 1951 ? ?OT Diagnosis: abnormal posture, acute pain, and muscle weakness (generalized) ?Rehab Potential: Rehab Potential (ACUTE ONLY): Good ?ELOS: 10-14 days  ? ?Today's Date: 07/02/2021 ?OT Individual Time: 1050-1200 ?OT Individual Time Calculation (min): 70 min    ? ?Hospital Problem: Principal Problem: ?  Right below-knee amputee [facility]) ? ? ?Past Medical History:  ?Past Medical History:  ?Diagnosis Date  ? BPH (benign prostatic hyperplasia)   ? CLL (chronic lymphocytic leukemia) ([place])   ? Hypertension   ? Pacemaker   ? Medtronic Device  ? ?Past Surgical History:  ?Past Surgical History:  ?Procedure Laterality Date  ? ABDOMINAL AORTOGRAM W/LOWER EXTREMITY Bilateral 06/21/2021  ? Procedure: ABDOMINAL AORTOGRAM W/LOWER EXTREMITY;  Surgeon: Waynetta Sandy, MD;  Location: [place] [facility];  Service: Cardiovascular;  Laterality: Bilateral;  ? AMPUTATION Right 06/25/2021  ? Procedure: RIGHT BELOW KNEE AMPUTATION;  Surgeon: Waynetta Sandy, MD;  Location: [place];  Service: Vascular;  Laterality: Right;  ? FEMORAL-POPLITEAL BYPASS GRAFT Right 06/22/2021  ? Procedure: RIGHT FEMORAL-POPLITEAL BYPASS WITH VEIN, RIGHT POPLITEAL THROMBECTOMY;  Surgeon: Waynetta Sandy, MD;  Location: [place];  Service: Vascular;  Laterality: Right;  ? LEFT HEART CATH AND CORONARY ANGIOGRAPHY N/A 01/18/2021  ? Procedure: LEFT HEART CATH AND CORONARY ANGIOGRAPHY;  Surgeon: Burnell Blanks, MD;  Location: [place] CV LAB;  Service: Cardiovascular;  Laterality: N/A;  ? PACEMAKER IMPLANT N/A 01/19/2021  ? Procedure: PACEMAKER IMPLANT;  Surgeon: Evans Lance, MD;  Location: [place] CV LAB;  Service: Cardiovascular;  Laterality: N/A;  ? ? ?Assessment & Plan ?Clinical Impression: Douglas [name] is a 71 year old right-handed male with history of BPH, hypertension, CAD with pacemaker  01/19/2021 per Dr. Osie Cheeks, history of tobacco use, CLL diagnosed 11/2018 followed by Dr. Alen Blew.  Per chart review patient lives with spouse.  Two-level home 3 steps to entry.  Independent driving prior to admission.  Wife works from home.  Presented 06/18/2021 with right foot swelling ischemic change as well as rest pain.  Patient had recently been treated for plantar fasciitis and metatarsal condition with podiatry 2 weeks ago.  Vascular surgery consulted underwent angiogram that showed the aorta and iliac segments free of flow-limiting stenosis.  Bilateral renal arteries patent.  Right lower extremity SFA occludes at the takeoff.  He reconstitutes above the knee where it is diseased and below the knee noted to be healthier.  Patient underwent right common femoral to below-knee popliteal artery bypass with nonreversed ipsilateral translocated greater saphenous vein with right posterior tibial ankle exposure and thrombectomy 06/22/2021 per Dr. Donzetta Matters.  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 Donzetta Matters.  Nonweightbearing right lower extremity.  Placed on subcutaneous heparin for DVT prophylaxis.  Developed episode of NSVT postoperatively cardiology service was consulted with echocardiogram completed showing ejection fraction of 50 to 55% the left ventricle demonstrated regional wall motion abnormality.  He was placed on Lopressor twice daily.  Acute blood loss anemia 10.6 and monitored.  Patient did have an episode of coffee-ground emesis 3/14 and placed on Protonix twice daily.  He did have bouts of urinary retention requiring a Foley catheter tube with planned voiding trial.  Patient was asymptomatic and close monitoring of hemoglobin/hematocrit.  Therapy evaluations completed due to patient decreased functional mobility was admitted for a comprehensive rehab program. Currently with phantom  limb pain. Patient transferred to CIR  on 07/01/2021 .   ? ?Patient currently requires mod with basic self-care skills secondary to muscle weakness and decreased sitting balance, decreased standing balance, decreased postural control, and decreased balance strategies.  Prior to hospitalization, patient could complete ADLs/IADLs with independence. ? ?Patient will benefit from skilled intervention to decrease level of assist with basic self-care skills and increase independence with basic self-care skills prior to discharge home with care partner.  Anticipate patient will require intermittent supervision and follow up home health. ? ?OT - End of Session ?Activity Tolerance: Improving ?Endurance Deficit: Yes ?Endurance Deficit Description: Quick to fatigue; requires frequent rest breaks ?OT Assessment ?Rehab Potential (ACUTE ONLY): Good ?OT Barriers to Discharge: Inaccessible home environment;Home environment access/layout ?OT Patient demonstrates impairments in the following area(s): Balance;Endurance;Pain;Skin Integrity ?OT Basic ADL's Functional Problem(s): Grooming;Bathing;Dressing;Toileting ?OT Transfers Functional Problem(s): Toilet ?OT Additional Impairment(s): None ?OT Plan ?OT Intensity: Minimum of 1-2 x/day, 45 to 90 minutes ?OT Frequency: 5 out of 7 days ?OT Duration/Estimated Length of Stay: 10-14 days ?OT Treatment/Interventions: Nurse, mental health reintegration;Discharge planning;Disease mangement/prevention;DME/adaptive equipment instruction;Functional mobility training;Pain management;Patient/family education;Psychosocial support;Self Care/advanced ADL retraining;Skin care/wound managment;Therapeutic Activities;Therapeutic Exercise;UE/LE Strength taining/ROM ?OT Self Feeding Anticipated Outcome(s): N/A ?OT Basic Self-Care Anticipated Outcome(s): S to Min A ?OT Toileting Anticipated Outcome(s): S ?OT Bathroom Transfers Anticipated Outcome(s): S ?OT Recommendation ?Patient destination: Home ?Follow Up Recommendations: Home  health OT ?Equipment Recommended: To be determined ? ? ?OT Evaluation ?Precautions/Restrictions  ?Precautions ?Precautions: Fall ?Precaution Comments: R BKA 3/17 ?Required Braces or Orthoses: Other Brace ?Other Brace: limb guard ?Restrictions ?Weight Bearing Restrictions: Yes ?RLE Weight Bearing: Non weight bearing ?General ?Chart Reviewed: Yes ?Family/Caregiver Present: No ?Vital Signs ?Therapy Vitals ?Temp: 98.2 ?F (36.8 ?C) ?Temp Source: Oral ?Pulse Rate: 92 ?Resp: 16 ?BP: (!) 124/59 ?Patient Position (if appropriate): Sitting ?Oxygen Therapy ?SpO2: 95 % ?O2 Device: Room Air ?Pain ?Pain Assessment ?Pain Scale: 0-10 ?Pain Score: 1  (at rest) ?Faces Pain Scale: No hurt ?Pain Type: Surgical pain ?Pain Location: Other (Comment) (Residual limb) ?Pain Orientation: Right ?Pain Descriptors / Indicators: Shooting ?Pain Onset: On-going ?Pain Intervention(s): Repositioned;Medication (See eMAR);Relaxation;Emotional support ?Multiple Pain Sites: No ?Home Living/Prior Functioning ?Home Living ?Family/patient expects to be discharged to:: Private residence ?Living Arrangements: Spouse/significant other ?Available Help at Discharge: Family, Available 24 hours/day ?Type of Home: House ?Home Access: Stairs to enter ?Entrance Stairs-Number of Steps: 3 ?Entrance Stairs-Rails: Left ?Home Layout: 1/2 bath on main level, Bed/bath upstairs, Two level ?Alternate Level Stairs-Number of Steps: 15 ?Alternate Level Stairs-Rails: Can reach both ?Bathroom Shower/Tub: Other (comment) (plans to spongebathe) ?Bathroom Toilet: Standard ?Bathroom Accessibility: Yes ?Additional Comments: Was independent and still driving, retired ? Lives With: Spouse ?IADL History ?IADL Comments: Was doing yardwork ?Prior Function ?Level of Independence: Independent with gait, Independent with transfers ? Able to Take Stairs?: Yes ?Driving: Yes ?Vocation: Retired ?Vision ?Baseline Vision/History: 1 Wears glasses ?Ability to See in Adequate Light: 0 Adequate ?Patient  Visual Report: No change from baseline ?Vision Assessment?: No apparent visual deficits ?Perception  ?Perception: Within Functional Limits ?Praxis ?Praxis: Intact ?Cognition ?Cognition ?Overall Cognitive Sta

## 2021-07-02 NOTE — Discharge Instructions (Addendum)
Inpatient Rehab Discharge Instructions ? ?Douglas [name] ?Discharge date and time: No discharge date for patient encounter.  ? ?Activities/Precautions/ Functional Status: ?Activity: activity as tolerated ?Diet: diabetic diet ?Wound Care: Routine skin checks ?Functional status:  ?___ No restrictions     ___ Walk up steps independently ?___ 24/7 supervision/assistance   ___ Walk up steps with assistance ?___ Intermittent supervision/assistance  ___ Bathe/dress independently ?___ Walk with walker     __x_ Bathe/dress with assistance ?___ Walk Independently    ___ Shower independently ?___ Walk with assistance    ___ Shower with assistance ?___ No alcohol     ___ Return to work/school ________ ? ?COMMUNITY REFERRALS UPON DISCHARGE:   ? ?Home Health:   PT     OT       RN     ?               Agency: [facility]  [phone]  ? ? ?Medical Equipment/Items Ordered: Bari Drop Arm Bedside Commode and Wheelchair  ?                                                Agency/Supplier: [facility] [phone] ? ? ?Special Instructions:  ?No driving smoking or alcohol ? ?Ambulatory referral obtained with urology services for voiding trial. ? ?Skin care.  Clean the medial incision site with normal saline and pat dry.  Apply a thin coat of Mupirocin ointment over the incision and cover with dry gauze, then use paper tape to secure in place.  Apply a stump sock back over the area ? ? ?My questions have been answered and I understand these instructions. I will adhere to these goals and the provided educational materials after my discharge from the hospital. ? ?Patient/Caregiver Signature _______________________________ Date __________ ? ?Clinician Signature _______________________________________ Date __________ ? ?Please bring this form and your medication list with you to all your follow-up doctor's appointments.   ?

## 2021-07-02 NOTE — Progress Notes (Signed)
Inpatient Rehabilitation Care Coordinator ?Assessment and Plan ?Patient Details  ?Name: Douglas [name] ?MRN: [id number] ?Date of Birth: 16-Jun-1950 ? ?Today's Date: 07/02/2021 ? ?Hospital Problems: Principal Problem: ?  Right below-knee amputee [facility]) ? ?Past Medical History:  ?Past Medical History:  ?Diagnosis Date  ? BPH (benign prostatic hyperplasia)   ? CLL (chronic lymphocytic leukemia) ([place])   ? Hypertension   ? Pacemaker   ? Medtronic Device  ? ?Past Surgical History:  ?Past Surgical History:  ?Procedure Laterality Date  ? ABDOMINAL AORTOGRAM W/LOWER EXTREMITY Bilateral 06/21/2021  ? Procedure: ABDOMINAL AORTOGRAM W/LOWER EXTREMITY;  Surgeon: Waynetta Sandy, MD;  Location: [place] [facility];  Service: Cardiovascular;  Laterality: Bilateral;  ? AMPUTATION Right 06/25/2021  ? Procedure: RIGHT BELOW KNEE AMPUTATION;  Surgeon: Waynetta Sandy, MD;  Location: [place];  Service: Vascular;  Laterality: Right;  ? FEMORAL-POPLITEAL BYPASS GRAFT Right 06/22/2021  ? Procedure: RIGHT FEMORAL-POPLITEAL BYPASS WITH VEIN, RIGHT POPLITEAL THROMBECTOMY;  Surgeon: Waynetta Sandy, MD;  Location: [place];  Service: Vascular;  Laterality: Right;  ? LEFT HEART CATH AND CORONARY ANGIOGRAPHY N/A 01/18/2021  ? Procedure: LEFT HEART CATH AND CORONARY ANGIOGRAPHY;  Surgeon: Burnell Blanks, MD;  Location: [place] CV LAB;  Service: Cardiovascular;  Laterality: N/A;  ? PACEMAKER IMPLANT N/A 01/19/2021  ? Procedure: PACEMAKER IMPLANT;  Surgeon: Evans Lance, MD;  Location: [place] [facility] LAB;  Service: Cardiovascular;  Laterality: N/A;  ? ?Social History:  reports that he has quit smoking. His smoking use included cigarettes. He has a 5.00 pack-year smoking history. He has never used smokeless tobacco. He reports that he does not currently use alcohol. He reports that he does not use drugs. ? ?Family / Support Systems ?Spouse/Significant Other: Dorothy ?Other Supports: spouse [facility]) ?Anticipated  Caregiver: Earlie Server ?Ability/Limitations of Caregiver: works from home ?Caregiver Availability: 24/7 ?Family Dynamics: support from spouse ? ?Social History ?Preferred language: English ?Religion: Patient Refused ?Legal History/Current Legal Issues: n/a ?Guardian/Conservator: n/a  ? ?Abuse/Neglect ?Abuse/Neglect Assessment Can Be Completed: Yes ?Physical Abuse: Denies ?Verbal Abuse: Denies ?Sexual Abuse: Denies ?Exploitation of patient/patient's resources: Denies ?Self-Neglect: Denies ? ?Patient response to: ?Social Isolation - How often do you feel lonely or isolated from those around you?: Never ? ?Emotional Status ?Recent Psychosocial Issues: coping ?Psychiatric History: n/a ?Substance Abuse History: hx of tobacco use ? ?Patient / Family Perceptions, Expectations & Goals ?Pt/Family understanding of illness & functional limitations: yes ?Premorbid pt/family roles/activities: previously independent and driving ?Anticipated changes in roles/activities/participation: spouse able to assist with roles or tasks ?Pt/family expectations/goals: MOD I to Supervision ? ?Community Resources ?Community Agencies: None ?Premorbid Home Care/DME Agencies: None ?Transportation available at discharge: spouse able to transport ?Is the patient able to respond to transportation needs?: Yes ?In the past 12 months, has lack of transportation kept you from medical appointments or from getting medications?: No ?In the past 12 months, has lack of transportation kept you from meetings, work, or from getting things needed for daily living?: No ? ?Discharge Planning ?Living Arrangements: Spouse/significant other ?Support Systems: Spouse/significant other ?Type of Residence: Private residence ?Insurance Resources: Multimedia programmer (specify) ?Financial Resources: Family Support, Employment ?Financial Screen Referred: No ?Living Expenses: Own ?Money Management: Patient, Spouse ?Does the patient have any problems obtaining your medications?:  No ?Home Management: independent ?Patient/Family Preliminary Plans: spouse able to assist ?Care Coordinator Barriers to Discharge: Incontinence, Insurance for SNF coverage ?Care Coordinator Barriers to Discharge Comments: 16 cath ?Care Coordinator Anticipated Follow Up Needs: HH/OP ?Expected length of stay: 10-14  Days ? ?Clinical Impression ?SW spoke with patient spouse, introduced self and explained role. Patient spouse works from home, patient plans to return. Currently spouse doesn't not think patient is ready to begin therapies, sw followed up with physician. No additional questions or concerns.  ? ?Dyanne Iha ?07/02/2021, 11:52 AM ? ?  ?

## 2021-07-03 DIAGNOSIS — Z89511 Acquired absence of right leg below knee: Secondary | ICD-10-CM

## 2021-07-03 DIAGNOSIS — R339 Retention of urine, unspecified: Secondary | ICD-10-CM | POA: Diagnosis not present

## 2021-07-03 DIAGNOSIS — G8918 Other acute postprocedural pain: Secondary | ICD-10-CM | POA: Diagnosis not present

## 2021-07-03 DIAGNOSIS — I739 Peripheral vascular disease, unspecified: Secondary | ICD-10-CM

## 2021-07-03 LAB — CBC WITH DIFFERENTIAL/PLATELET
Abs Immature Granulocytes: 0.34 10*3/uL — ABNORMAL HIGH (ref 0.00–0.07)
Basophils Absolute: 0.1 10*3/uL (ref 0.0–0.1)
Basophils Relative: 1 %
Eosinophils Absolute: 0.1 10*3/uL (ref 0.0–0.5)
Eosinophils Relative: 1 %
HCT: 30.4 % — ABNORMAL LOW (ref 39.0–52.0)
Hemoglobin: 9.9 g/dL — ABNORMAL LOW (ref 13.0–17.0)
Immature Granulocytes: 2 %
Lymphocytes Relative: 27 %
Lymphs Abs: 4 10*3/uL (ref 0.7–4.0)
MCH: 29.6 pg (ref 26.0–34.0)
MCHC: 32.6 g/dL (ref 30.0–36.0)
MCV: 91 fL (ref 80.0–100.0)
Monocytes Absolute: 0.6 10*3/uL (ref 0.1–1.0)
Monocytes Relative: 4 %
Neutro Abs: 9.6 10*3/uL — ABNORMAL HIGH (ref 1.7–7.7)
Neutrophils Relative %: 65 %
Platelets: 705 10*3/uL — ABNORMAL HIGH (ref 150–400)
RBC: 3.34 MIL/uL — ABNORMAL LOW (ref 4.22–5.81)
RDW: 16 % — ABNORMAL HIGH (ref 11.5–15.5)
WBC: 14.6 10*3/uL — ABNORMAL HIGH (ref 4.0–10.5)
nRBC: 0 % (ref 0.0–0.2)

## 2021-07-03 LAB — COMPREHENSIVE METABOLIC PANEL
ALT: 72 U/L — ABNORMAL HIGH (ref 0–44)
AST: 59 U/L — ABNORMAL HIGH (ref 15–41)
Albumin: 2.3 g/dL — ABNORMAL LOW (ref 3.5–5.0)
Alkaline Phosphatase: 186 U/L — ABNORMAL HIGH (ref 38–126)
Anion gap: 6 (ref 5–15)
BUN: 13 mg/dL (ref 8–23)
CO2: 25 mmol/L (ref 22–32)
Calcium: 8.1 mg/dL — ABNORMAL LOW (ref 8.9–10.3)
Chloride: 99 mmol/L (ref 98–111)
Creatinine, Ser: 0.66 mg/dL (ref 0.61–1.24)
GFR, Estimated: >60 mL/min (ref >60)
Glucose, Bld: 168 mg/dL — ABNORMAL HIGH (ref 70–99)
Potassium: 4.7 mmol/L (ref 3.5–5.1)
Sodium: 130 mmol/L — ABNORMAL LOW (ref 135–145)
Total Bilirubin: 0.9 mg/dL (ref 0.3–1.2)
Total Protein: 5.1 g/dL — ABNORMAL LOW (ref 6.5–8.1)

## 2021-07-03 LAB — GLUCOSE, CAPILLARY
Glucose-Capillary: 159 mg/dL — ABNORMAL HIGH (ref 70–99)
Glucose-Capillary: 179 mg/dL — ABNORMAL HIGH (ref 70–99)
Glucose-Capillary: 146 mg/dL — ABNORMAL HIGH (ref 70–99)
Glucose-Capillary: 194 mg/dL — ABNORMAL HIGH (ref 70–99)

## 2021-07-03 MED ORDER — MELATONIN 5 MG PO TABS
5.0000 mg | ORAL_TABLET | Freq: Every day | ORAL | Status: DC
  Administered 2021-07-03 – 2021-07-18 (×16): 5 mg via ORAL
  Filled 2021-07-03 (×16): qty 1

## 2021-07-03 NOTE — Progress Notes (Signed)
Physical Therapy Session Note ? ?Patient Details  ?Name: Math Brazie ?MRN: [id number] ?Date of Birth: 01-09-1951 ? ?Today's Date: 07/03/2021 ?PT Individual Time: 1443-1540 ?PT Individual Time Calculation (min): 40 min  ? ?Short Term Goals: ?Week 1:  PT Short Term Goal 1 (Week 1): Pt will perform sit to stand from wc w/mod assist of 1 ?PT Short Term Goal 2 (Week 1): pt will transfer bed to/from wc w/LRAD and mod assist ?PT Short Term Goal 3 (Week 1): Pt will propel wc 127f w/additional time ?PT Short Term Goal 4 (Week 1): Pt will tolerate gait assessment w/LRAD ? ?Skilled Therapeutic Interventions/Progress Updates:  ?Pt was seen in the pm following OT session. Pt performed sit to stand transfers in parallel bars with mod to max A and verbal cues. Standing tolerance about 20 to 30 seconds each stand with c/g to min A. Pt propelled w/c about 50 feet with B UEs and S with increased time and several rest breaks. Pt transferred w/c to edge of bed with sliding board and min A. Pt transferred to supine with S. Pt left sitting up in bed with all needs within reach and bed alarm on.  ? 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 ?Pain: ?No c/o pain.  ? ? ? ?Therapy/Group: Individual Therapy ? ?MDub Amis?07/03/2021, 3:15 PM  ?

## 2021-07-03 NOTE — IPOC Note (Signed)
Overall Plan of Care (IPOC) ?Patient Details ?Name: Douglas [name] ?MRN: [id number] ?DOB: [birth year] ? ?Admitting Diagnosis: Right below-knee amputee ([place]) ? ?Hospital Problems: Principal Problem: ?  Right below-knee amputee [facility]) ? ? ? ? Functional Problem List: ?Nursing Bladder, Bowel, Medication Management, Safety, Skin Integrity, Endurance, Pain  ?PT Balance, Behavior, Endurance, Motor, Pain, Safety, Skin Integrity  ?OT Balance, Endurance, Pain, Skin Integrity  ?SLP    ?TR    ?    ? Basic ADL?s: ?OT Grooming, Bathing, Dressing, Toileting  ? ?  Advanced  ADL?s: ?OT    ?   ?Transfers: ?PT Bed Mobility, Bed to Chair, Car, Furniture  ?OT Toilet  ? ?  Locomotion: ?PT Ambulation, Wheelchair Mobility, Stairs  ? ?  Additional Impairments: ?OT None  ?SLP   ?  ?   ?TR    ? ? ?Anticipated Outcomes ?Item Anticipated Outcome  ?Self Feeding N/A  ?Swallowing ?   ?  ?Basic self-care ? S to [place] A  ?Toileting ? S ?  ?Bathroom Transfers S  ?Bowel/Bladder ? manage bowel and bladder w mod I assist  ?Transfers ? mod I  ?Locomotion ? household min assist  ?Communication ?    ?Cognition ?    ?Pain ? pain at or below level 4 w prns  ?Safety/Judgment ? maintain safety w cues  ? ?Therapy Plan: ?PT Intensity: Minimum of 1-2 x/day ,45 to 90 minutes ?PT Frequency: 5 out of 7 days ?PT Duration Estimated Length of Stay: 14-17 days ?OT Intensity: Minimum of 1-2 x/day, 45 to 90 minutes ?OT Frequency: 5 out of 7 days ?OT Duration/Estimated Length of Stay: 10-14 days ?   ? ?Due to the current state of emergency, patients may not be receiving their 3-hours of Medicare-mandated therapy. ? ? Team Interventions: ?Nursing Interventions Patient/Family Education, Bowel Management, Pain Management, Disease Management/Prevention, Bladder Management, Medication Management, Discharge Planning, Skin Care/Wound Management  ?PT interventions Ambulation/gait training, DME/adaptive equipment instruction, Neuromuscular re-education, Psychosocial support, Stair  training, UE/LE Strength taining/ROM, Wheelchair propulsion/positioning, Training and development officer, Discharge planning, Pain management, Skin care/wound management, Therapeutic Activities, UE/LE Coordination activities, Disease management/prevention, Functional mobility training, Patient/family education, Splinting/orthotics, Therapeutic Exercise  ?OT Interventions Training and development officer, Community reintegration, Discharge planning, Disease mangement/prevention, Engineer, drilling, Functional mobility training, Pain management, Patient/family education, Psychosocial support, Self Care/advanced ADL retraining, Skin care/wound managment, Therapeutic Activities, Therapeutic Exercise, UE/LE Strength taining/ROM  ?SLP Interventions    ?TR Interventions    ?SW/CM Interventions Discharge Planning, Psychosocial Support, Patient/Family Education, Disease Management/Prevention  ? ?Barriers to Discharge ?MD  Medical stability  ?Nursing Decreased caregiver support, Home environment access/layout ?2 level 3 ste 1/2 bath on main w wife (works remotely)  ?PT Home environment access/layout, Weight, Weight bearing restrictions, Behavior ?self limiting behaviour at eval  ?OT Inaccessible home environment, Home environment access/layout ?   ?SLP   ?   ?SW Incontinence, Insurance for SNF coverage ?16 cath  ? ?Team Discharge Planning: ?Destination: PT-Home ,OT- Home , SLP-  ?Projected Follow-up: PT-Home health PT, OT-  Home health OT, SLP-  ?Projected Equipment Needs: PT-To be determined, OT- To be determined, SLP-  ?Equipment Details: PT- , OT-  ?Patient/family involved in discharge planning: PT- Patient,  OT-Patient, SLP-  ? ?MD ELOS: 8-12 days ?Medical Rehab Prognosis:  Excellent ?Assessment: The patient has been admitted for CIR therapies with the diagnosis of rigght BKA. The team will be addressing functional mobility, strength, stamina, balance, safety, adaptive techniques and equipment, self-care, bowel and  bladder mgt, patient and caregiver education. Goals  have been set at [place]. Anticipated discharge destination is home. ? ? ?See Team Conference Notes for weekly updates to the plan of care  ?

## 2021-07-03 NOTE — Progress Notes (Signed)
Physical Therapy Session Note ? ?Patient Details  ?Name: Douglas [name] ?MRN: [id number] ?Date of Birth: [birth year] ? ?Today's Date: 07/03/2021 ?PT Individual Time: 7416-3845 ?PT Individual Time Calculation (min): 55 min  ? ?Short Term Goals: ?Week 1:  PT Short Term Goal 1 (Week 1): Pt will perform sit to stand from wc w/mod assist of 1 ?PT Short Term Goal 2 (Week 1): pt will transfer bed to/from wc w/LRAD and mod assist ?PT Short Term Goal 3 (Week 1): Pt will propel wc 193f w/additional time ?PT Short Term Goal 4 (Week 1): Pt will tolerate gait assessment w/LRAD ? ?Skilled Therapeutic Interventions/Progress Updates:  ?Pt was seen bedside in the am. Pt transferred supine to edge of bed with side rail and c/g with verbal cues. Pt transferred edge of bed to w/c with sliding board and min A with verbal cues. Pt propelled w/c about 75 feet with B UEs and several rest breaks with S and verbal cues. In gym pt performed B hip flex and LAQs, 3 sets x 10 reps each. Performed static stretching R knee x 5 minutes, followed by 3 sets x 10 quad sets. Pt returned to room. Pt transferred w/c to edge of bed with sliding board and min A with verbal cues. Pt transferred edge of bed to supine with S. Pt left sitting up in bed with bed alarm and call bell within reach.  ? 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  ?Pain: ?Pt c/o 5/10 residual limb pain.  ? ?Therapy/Group: Individual Therapy ? ?MDub Amis?07/03/2021, 12:05 PM  ?

## 2021-07-03 NOTE — Progress Notes (Signed)
Occupational Therapy Session Note ? ?Patient Details  ?Name: Douglas [name] ?MRN: [id number] ?Date of Birth: 1950/09/02 ? ?Today's Date: 07/03/2021 ?OT Individual Time: 0700-0800 ?OT Individual Time Calculation (min): 60 min  ? ?Today's Date: 07/03/2021 ?OT Individual Time: 8375-4237 ?OT Individual Time Calculation (min): 30 min  ? ?Short Term Goals: ?Week 1:  OT Short Term Goal 1 (Week 1): Patient will don LB clothing with Min A and lateral leans. ?OT Short Term Goal 2 (Week 1): Patient will complete 2/3 parts of toileting task with Min A and LRAD. ?OT Short Term Goal 3 (Week 1): Patient will completed 2/3 parts of toileting task with with Min A and LRAD. ? ?Skilled Therapeutic Interventions/Progress Updates:  ?  Session 1: ? ?Pt received in bed with 8 out of 10 pain in residual limb. Pt resistant to relaxation, imagery as pain relief interventions. Premedicated. Rest provided PRN. ? ?ADL: ?Pt completes ADL at overall set up for UB/ CGA for LB at seated EOB Level. Skilled interventions include: edu re catheter management, lateral leans and LB sequencing. Pt demo mild impairments in sitting balance with occasional posterior LOB but able to recover with no A. Pt able to lean laterally with MIN A to advance pants past hips when donning (S for doffing). Pt declines donning sock. Seated EOB grooming with set up. ? ?Pt educated on walk in shower transfer as handicap apartment they have reserved. Unsure glass door v curtain. Edu re TTB v shower chair v BSC. Pt verbalized understanding and agreeable to attempt certain methods at a later date.  ? ?Pt left at end of session in bed with exit alarm on, call light in reach and all needs met ? ?Session 2: ? ?Pt received in bed with 2 out of 10 pain in residual limb.  ?Therapeutic exercise ?Transfers with min-CGA via SB and scoot pivot no board with encouragement and VC for hand placment/weight shift forward. Pt completes EOM therex: 2x6 sit ups from wedge and 2x8 tricep kick backs.  Pt reporting his is "not able to build strength because of my maintenance chemo medication" pt with this clinician and following handoff to PT to discuss how his progress may be slower than normal but can still work on deficits to improve safety and indepence with tasks.  ? ?Pt left at end of session in w/c with PT  ? 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  ? ? ?Therapy/Group: Individual Therapy ? ?[name] ?07/03/2021, 6:52 AM ?

## 2021-07-03 NOTE — Progress Notes (Signed)
?                                                       PROGRESS NOTE ? ? ?Subjective/Complaints: ?Having a lot of right stump, incisional pain. Did not sleep well either, partly d/t pain. Therapy going well so far ? ?ROS: Patient denies fever, rash, sore throat, blurred vision, dizziness, nausea, vomiting, diarrhea, cough, shortness of breath or chest pain, neck pain, headache, or mood change.  ? ? ?Objective: ?  ?No results found. ?Recent Labs  ?  07/02/21 ?3235 07/03/21 ?5732  ?WBC 14.0* 14.6*  ?HGB 9.9* 9.9*  ?HCT 31.0* 30.4*  ?PLT 715* 705*  ? ?Recent Labs  ?  07/02/21 ?2025 07/03/21 ?4270  ?NA 132* 130*  ?K 4.9 4.7  ?CL 97* 99  ?CO2 29 25  ?GLUCOSE 149* 168*  ?BUN 16 13  ?CREATININE 0.81 0.66  ?CALCIUM 8.3* 8.1*  ? ? ?Intake/Output Summary (Last 24 hours) at 07/03/2021 1233 ?Last data filed at 07/03/2021 6237 ?Gross per 24 hour  ?Intake 354 ml  ?Output 2375 ml  ?Net -2021 ml  ?  ? ?  ? ?Physical Exam: ?Vital Signs ?Blood pressure 125/65, pulse 82, temperature 98.6 ?F (37 ?C), temperature source Oral, resp. rate 16, height 6' (1.829 m), weight 96.7 kg, SpO2 96 %. ? ?General: Alert and oriented x 3, No apparent distress ?HEENT: Head is normocephalic, atraumatic, PERRLA, EOMI, sclera anicteric, oral mucosa pink and moist, dentition intact, ext ear canals clear,  ?Neck: Supple without JVD or lymphadenopathy ?Heart: Reg rate and rhythm. No murmurs rubs or gallops ?Chest: CTA bilaterally without wheezes, rales, or rhonchi; no distress ?Abdomen: Soft, non-tender, non-distended, bowel sounds positive. ?Extremities: No clubbing, cyanosis, or edema. Pulses are 2+ ?Psych: Pt's affect is appropriate. Pt is cooperative ?Skin: mild serosang drainage from incisions ?Neuro:  Alert and oriented x 3. Normal insight and awareness. Intact Memory. Normal language and speech. Cranial nerve exam unremarkable. Moves all 4's, right leg limted somewhat by pain ?Musculoskeletal: right BK stump well shaped, tolerating shrinker, ongoing  edema  ? ? ?Assessment/Plan: ?1. Functional deficits which require 3+ hours per day of interdisciplinary therapy in a comprehensive inpatient rehab setting. ?Physiatrist is providing close team supervision and 24 hour management of active medical problems listed below. ?Physiatrist and rehab team continue to assess barriers to discharge/monitor patient progress toward functional and medical goals ? ?Care Tool: ? ?Bathing ?   ?Body parts bathed by patient: Right arm, Left arm, Chest, Abdomen, Front perineal area, Right upper leg, Left upper leg, Left lower leg, Face  ? Body parts bathed by helper: Buttocks ?Body parts n/a: Right lower leg ?  ?Bathing assist Assist Level: Minimal Assistance - Patient > 75% ?  ?  ?Upper Body Dressing/Undressing ?Upper body dressing   ?What is the patient wearing?: Pull over shirt ?   ?Upper body assist Assist Level: Set up assist ?   ?Lower Body Dressing/Undressing ?Lower body dressing ? ? ?   ?What is the patient wearing?: Pants ? ?  ? ?Lower body assist Assist for lower body dressing: Moderate Assistance - Patient 50 - 74% ?   ? ?Toileting ?Toileting Toileting Activity did not occur (Probation officer and hygiene only): N/A (no void or bm)  ?Toileting assist Assist for toileting: Moderate Assistance - Patient 50 - 74% ?  ?  ?  Transfers ?Chair/bed transfer ? ?Transfers assist ?   ? ?Chair/bed transfer assist level: Minimal Assistance - Patient > 75% ?  ?  ?Locomotion ?Ambulation ? ? ?Ambulation assist ? ? Ambulation activity did not occur: Safety/medical concerns (max assist to stand, unable to achieve full upright w/RW) ? ?  ?  ?   ? ?Walk 10 feet activity ? ? ?Assist ? Walk 10 feet activity did not occur: Safety/medical concerns ? ?  ?   ? ?Walk 50 feet activity ? ? ?Assist Walk 50 feet with 2 turns activity did not occur: Safety/medical concerns ? ?  ?   ? ? ?Walk 150 feet activity ? ? ?Assist Walk 150 feet activity did not occur: Safety/medical concerns ? ?  ?  ?  ? ?Walk 10 feet  on uneven surface  ?activity ? ? ?Assist Walk 10 feet on uneven surfaces activity did not occur: Safety/medical concerns ? ? ?  ?   ? ?Wheelchair ? ? ? ? ?Assist Is the patient using a wheelchair?: Yes ?Type of Wheelchair: Manual ?  ? ?Wheelchair assist level: Supervision/Verbal cueing ?Max wheelchair distance: 87  ? ? ?Wheelchair 50 feet with 2 turns activity ? ? ? ?Assist ? ?  ?  ? ? ?Assist Level: Supervision/Verbal cueing  ? ?Wheelchair 150 feet activity  ? ? ? ?Assist ?   ? ? ?Assist Level: Maximal Assistance - Patient 25 - 49%  ? ?Blood pressure 125/65, pulse 82, temperature 98.6 ?F (37 ?C), temperature source Oral, resp. rate 16, height 6' (1.829 m), weight 96.7 kg, SpO2 96 %. ? ?Medical Problem List and Plan: ?1. 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            -patient may shower but incision must be covered.  ?            -ELOS/Goals: 8-12 days modI ?           -Continue CIR therapies including PT, OT  ?2.  Antithrombotics: ?-DVT/anticoagulation:  Pharmaceutical: Heparin ?            -antiplatelet therapy: Aspirin 81 mg daily ?3. Phantom limb pain: Added additional '100mg'$  Neurontin HS. Continue Neurontin 300 mg 3 times daily  ?4. Mood/sleep: team providing egosupport ? -3/25 add melatonin '5mg'$  qhs to assist sleep ?            -antipsychotic agents: N/A ?5. Neuropsych: This patient is capable of making decisions on his own behalf. ?6. Skin/Wound Care: Routine skin checks ?7. Fluids/Electrolytes/Nutrition: Routine in and outs with follow-up chemistries ?8.  Acute blood loss anemia.   ?3/25Follow-up CBC today reviewed ?  -hgb stable at 9.9 ?9.  NSVT/CAD.  Follow cardiology service.  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  Continue Lopressor 25 mg every 8 hours and considering consolidating to Toprol on discharge.  Follow-up outpatient ?10.  Coffee-ground emesis x1 3/14.  H&H stable.  Plan outpatient follow-up GI services as needed.  Continue  Protonix twice daily. ? -asymptomatic ?11.  History of BPH/urinary retention.  Hytrin 10 mg nightly.  Foley tube remains in place plan voiding trial while on rehab ? 3/25 dc foley today and begin voiding trial ?12.  Hyperlipidemia.  Crestor ?13.  Hypertension.  Norvasc 10 mg daily, Avapro 300 mg daily.  Monitor with increased mobility ? 3/25 bp controlled ?14.  CLL.  Follow-up outpatient Dr. Alen Blew.  Continue IMBRUVICA 420 mg daily. PATIENT CAN BRING IN OWN SUPPLY IF NEEDED. ?  15.  Constipation.  Colace 100 mg twice daily ? -had large BM 3/25 ?16.  Hyperglycemia.  Hemoglobin A1c 6.7.  SSI.  Patient with no documented history of diabetes. ?17. Leukocytosis: repeat tomorrow to trend since elevated 3/24 ?18. Transaminitis: stopped tylenol.  ? 3/25 LFT's about the same today ? -repeat LFT's monday ?19. Residual limb pain: continue Robaxin 500 mg 3 times daily, oxycodone as needed ?20. Hyponatremia ? -may be related to CLL vs meds ? -recheck Monday ?  ? ?LOS: ?2 days ?A FACE TO FACE EVALUATION WAS PERFORMED ? ?Meredith Staggers ?07/03/2021, 12:33 PM  ? ?  ?

## 2021-07-03 NOTE — Progress Notes (Signed)
Orthopedic Tech Progress Note ?Patient Details:  ?Douglas [name] ?Jan 15, 1951 ?[id number] ?New BK Shrinker has been ordered from [facility]. Old shrinker is soiled   ?Patient ID: [name], male   DOB: 1951-03-25, 71 y.o.   MRN: [id number] ? ?Shambhavi Salley E Maesyn Frisinger ?07/03/2021, 1:09 PM ? ?

## 2021-07-03 NOTE — Progress Notes (Signed)
Pt refused to have Foley removed. Pt encouraged multiple times to remove foley, pt states that he has no urgency and will "will soak the bed a lot"  if we take it out. Pt educated on importance of voiding on his own, pt still refused removal. Dr. Naaman Plummer notified. Will keep foley in for now, MD will discuss with patient in the morning. ?

## 2021-07-04 DIAGNOSIS — R339 Retention of urine, unspecified: Secondary | ICD-10-CM | POA: Diagnosis not present

## 2021-07-04 DIAGNOSIS — Z89511 Acquired absence of right leg below knee: Secondary | ICD-10-CM | POA: Diagnosis not present

## 2021-07-04 DIAGNOSIS — G8918 Other acute postprocedural pain: Secondary | ICD-10-CM | POA: Diagnosis not present

## 2021-07-04 DIAGNOSIS — I739 Peripheral vascular disease, unspecified: Secondary | ICD-10-CM | POA: Diagnosis not present

## 2021-07-04 LAB — GLUCOSE, CAPILLARY
Glucose-Capillary: 167 mg/dL — ABNORMAL HIGH (ref 70–99)
Glucose-Capillary: 190 mg/dL — ABNORMAL HIGH (ref 70–99)
Glucose-Capillary: 165 mg/dL — ABNORMAL HIGH (ref 70–99)
Glucose-Capillary: 175 mg/dL — ABNORMAL HIGH (ref 70–99)

## 2021-07-04 NOTE — Progress Notes (Signed)
Pt Foley removed per MD orders. Pt still concerned that he will soil himself and stated that he would prefer to keep it in but understands why the MD wants to remove it. Pt tolerated well, pt stated that he does have pain but unrelated to removal of Foley Catheter. No other complaints at this time. Will continue to monitor.  ? ?Douglas Bellew E. Lilyan Punt, LPN ?

## 2021-07-04 NOTE — Progress Notes (Signed)
?                                                       PROGRESS NOTE ? ? ?Subjective/Complaints: ?Pt slept much better with melatonin last night. Did not want foley removed yesterday because he's worried he'll wet the bed.  ? ?ROS: Patient denies fever, rash, sore throat, blurred vision, dizziness, nausea, vomiting, diarrhea, cough, shortness of breath or chest pain,  neck pain, headache, or mood change.  ? ? ?Objective: ?  ?No results found. ?Recent Labs  ?  07/02/21 ?9937 07/03/21 ?1696  ?WBC 14.0* 14.6*  ?HGB 9.9* 9.9*  ?HCT 31.0* 30.4*  ?PLT 715* 705*  ? ?Recent Labs  ?  07/02/21 ?7893 07/03/21 ?8101  ?NA 132* 130*  ?K 4.9 4.7  ?CL 97* 99  ?CO2 29 25  ?GLUCOSE 149* 168*  ?BUN 16 13  ?CREATININE 0.81 0.66  ?CALCIUM 8.3* 8.1*  ? ? ?Intake/Output Summary (Last 24 hours) at 07/04/2021 0955 ?Last data filed at 07/04/2021 0534 ?Gross per 24 hour  ?Intake 537 ml  ?Output 1950 ml  ?Net -1413 ml  ?  ? ?  ? ?Physical Exam: ?Vital Signs ?Blood pressure 127/60, pulse 66, temperature 98.7 ?F (37.1 ?C), resp. rate 14, height 6' (1.829 m), weight 96.7 kg, SpO2 94 %. ? ?Constitutional: No distress . Vital signs reviewed. ?HEENT: NCAT, EOMI, oral membranes moist ?Neck: supple ?Cardiovascular: RRR without murmur. No JVD    ?Respiratory/Chest: CTA Bilaterally without wheezes or rales. Normal effort    ?GI/Abdomen: BS +, non-tender, non-distended ?Ext: no clubbing, cyanosis, or edema ?Psych: pleasant and cooperative  ?Skin: mild serosang drainage from incisions along medial aspect ?Neuro:  Alert and oriented x 3. Normal insight and awareness. Intact Memory. Normal language and speech. Cranial nerve exam unremarkable. Moves all 4's, right leg limted somewhat by pain ?Musculoskeletal: right BK stump well shaped, tolerating shrinker, edema improving ? ? ?Assessment/Plan: ?1. Functional deficits which require 3+ hours per day of interdisciplinary therapy in a comprehensive inpatient rehab setting. ?Physiatrist is providing close team  supervision and 24 hour management of active medical problems listed below. ?Physiatrist and rehab team continue to assess barriers to discharge/monitor patient progress toward functional and medical goals ? ?Care Tool: ? ?Bathing ?   ?Body parts bathed by patient: Right arm, Left arm, Chest, Abdomen, Front perineal area, Right upper leg, Left upper leg, Left lower leg, Face  ? Body parts bathed by helper: Buttocks ?Body parts n/a: Right lower leg ?  ?Bathing assist Assist Level: Minimal Assistance - Patient > 75% ?  ?  ?Upper Body Dressing/Undressing ?Upper body dressing   ?What is the patient wearing?: Pull over shirt ?   ?Upper body assist Assist Level: Set up assist ?   ?Lower Body Dressing/Undressing ?Lower body dressing ? ? ?   ?What is the patient wearing?: Pants ? ?  ? ?Lower body assist Assist for lower body dressing: Moderate Assistance - Patient 50 - 74% ?   ? ?Toileting ?Toileting Toileting Activity did not occur (Probation officer and hygiene only): N/A (no void or bm)  ?Toileting assist Assist for toileting: Moderate Assistance - Patient 50 - 74% ?  ?  ?Transfers ?Chair/bed transfer ? ?Transfers assist ?   ? ?Chair/bed transfer assist level: Minimal Assistance - Patient > 75% ?  ?  ?  Locomotion ?Ambulation ? ? ?Ambulation assist ? ? Ambulation activity did not occur: Safety/medical concerns (max assist to stand, unable to achieve full upright w/RW) ? ?  ?  ?   ? ?Walk 10 feet activity ? ? ?Assist ? Walk 10 feet activity did not occur: Safety/medical concerns ? ?  ?   ? ?Walk 50 feet activity ? ? ?Assist Walk 50 feet with 2 turns activity did not occur: Safety/medical concerns ? ?  ?   ? ? ?Walk 150 feet activity ? ? ?Assist Walk 150 feet activity did not occur: Safety/medical concerns ? ?  ?  ?  ? ?Walk 10 feet on uneven surface  ?activity ? ? ?Assist Walk 10 feet on uneven surfaces activity did not occur: Safety/medical concerns ? ? ?  ?   ? ?Wheelchair ? ? ? ? ?Assist Is the patient using a  wheelchair?: Yes ?Type of Wheelchair: Manual ?  ? ?Wheelchair assist level: Supervision/Verbal cueing ?Max wheelchair distance: 50  ? ? ?Wheelchair 50 feet with 2 turns activity ? ? ? ?Assist ? ?  ?  ? ? ?Assist Level: Supervision/Verbal cueing  ? ?Wheelchair 150 feet activity  ? ? ? ?Assist ?   ? ? ?Assist Level: Maximal Assistance - Patient 25 - 49%  ? ?Blood pressure 127/60, pulse 66, temperature 98.7 ?F (37.1 ?C), resp. rate 14, height 6' (1.829 m), weight 96.7 kg, SpO2 94 %. ? ?Medical Problem List and Plan: ?1. 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            -patient may shower but incision must be covered.  ?            -ELOS/Goals: 8-12 days modI ?        -Continue CIR therapies including PT, OT  ?2.  Antithrombotics: ?-DVT/anticoagulation:  Pharmaceutical: Heparin ?            -antiplatelet therapy: Aspirin 81 mg daily ?3. Phantom limb pain: Added additional '100mg'$  Neurontin HS. Continue Neurontin 300 mg 3 times daily  ?4. Mood/sleep: team providing egosupport ? -continue melatonin '5mg'$  qhs to assist sleep ?            -antipsychotic agents: N/A ?5. Neuropsych: This patient is capable of making decisions on his own behalf. ?6. Skin/Wound Care: Routine skin checks ?7. Fluids/Electrolytes/Nutrition: Routine in and outs with follow-up chemistries ?8.  Acute blood loss anemia.   ?3/25 Follow-up CBC  reviewed ?  -hgb stable at 9.9 ?9.  NSVT/CAD.  Follow cardiology service.  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  Continue Lopressor 25 mg every 8 hours and considering consolidating to Toprol on discharge.  Follow-up outpatient ?10.  Coffee-ground emesis x1 3/14.  H&H stable.  Plan outpatient follow-up GI services as needed.  Continue Protonix twice daily. ? -asymptomatic ?11.  History of BPH/urinary retention.  Hytrin 10 mg nightly.  Foley tube remains in place   ? 3/26 discussed foley with pt. He agrees to remove. ? -oob/eob to empty ? -timed  voids ?12.  Hyperlipidemia.  Crestor ?13.  Hypertension.  Norvasc 10 mg daily, Avapro 300 mg daily.  Monitor with increased mobility ? 3/26 bp controlled ?14.  CLL.  Follow-up outpatient Dr. Alen Blew.  Continue IMBRUVICA 420 mg daily. PATIENT CAN BRING IN OWN SUPPLY IF NEEDED. ?15.  Constipation.  Colace 100 mg twice daily ? -had large BM 3/25 ?16.  Hyperglycemia.  Hemoglobin A1c 6.7.  SSI.  Patient with  no documented history of diabetes. ?17. Leukocytosis: repeat tomorrow to trend since elevated 3/24 ?18. Transaminitis: stopped tylenol.  ? 3/25 LFT's about the same today ? -repeat LFT's monday ?19. Residual limb pain: continue Robaxin 500 mg 3 times daily, oxycodone as needed ?20. Hyponatremia ? -may be related to CLL vs meds ? -recheck Monday ?  ? ?LOS: ?3 days ?A FACE TO FACE EVALUATION WAS PERFORMED ? ?Meredith Staggers ?07/04/2021, 9:55 AM  ? ?  ?

## 2021-07-05 ENCOUNTER — Ambulatory Visit: Payer: Medicare Other | Admitting: Podiatry

## 2021-07-05 DIAGNOSIS — Z89511 Acquired absence of right leg below knee: Secondary | ICD-10-CM | POA: Diagnosis not present

## 2021-07-05 LAB — URINALYSIS, ROUTINE W REFLEX MICROSCOPIC
Bilirubin Urine: NEGATIVE
Glucose, UA: NEGATIVE mg/dL
Ketones, ur: NEGATIVE mg/dL
Nitrite: NEGATIVE
Protein, ur: 30 mg/dL — AB
Specific Gravity, Urine: 1.013 (ref 1.005–1.030)
WBC, UA: >50 WBC/hpf — ABNORMAL HIGH (ref 0–5)
pH: 6 (ref 5.0–8.0)

## 2021-07-05 LAB — COMPREHENSIVE METABOLIC PANEL
ALT: 118 U/L — ABNORMAL HIGH (ref 0–44)
AST: 61 U/L — ABNORMAL HIGH (ref 15–41)
Albumin: 2.2 g/dL — ABNORMAL LOW (ref 3.5–5.0)
Alkaline Phosphatase: 191 U/L — ABNORMAL HIGH (ref 38–126)
Anion gap: 7 (ref 5–15)
BUN: 17 mg/dL (ref 8–23)
CO2: 27 mmol/L (ref 22–32)
Calcium: 8.4 mg/dL — ABNORMAL LOW (ref 8.9–10.3)
Chloride: 94 mmol/L — ABNORMAL LOW (ref 98–111)
Creatinine, Ser: 0.63 mg/dL (ref 0.61–1.24)
GFR, Estimated: >60 mL/min (ref >60)
Glucose, Bld: 168 mg/dL — ABNORMAL HIGH (ref 70–99)
Potassium: 4.7 mmol/L (ref 3.5–5.1)
Sodium: 128 mmol/L — ABNORMAL LOW (ref 135–145)
Total Bilirubin: 0.8 mg/dL (ref 0.3–1.2)
Total Protein: 5.3 g/dL — ABNORMAL LOW (ref 6.5–8.1)

## 2021-07-05 LAB — GLUCOSE, CAPILLARY
Glucose-Capillary: 169 mg/dL — ABNORMAL HIGH (ref 70–99)
Glucose-Capillary: 185 mg/dL — ABNORMAL HIGH (ref 70–99)
Glucose-Capillary: 191 mg/dL — ABNORMAL HIGH (ref 70–99)
Glucose-Capillary: 214 mg/dL — ABNORMAL HIGH (ref 70–99)

## 2021-07-05 MED ORDER — MUPIROCIN 2 % EX OINT
TOPICAL_OINTMENT | Freq: Every day | CUTANEOUS | Status: DC
  Filled 2021-07-05: qty 22

## 2021-07-05 MED ORDER — ASCORBIC ACID 500 MG PO TABS
1000.0000 mg | ORAL_TABLET | Freq: Every day | ORAL | Status: DC
  Administered 2021-07-05 – 2021-07-18 (×14): 1000 mg via ORAL
  Filled 2021-07-05 (×14): qty 2

## 2021-07-05 NOTE — Progress Notes (Signed)
?                                                       PROGRESS NOTE ? ? ?Subjective/Complaints: ?Wife would like to learn wound care- will message team regarding scheduling therapy around daily wound care at 9am when she can be here.  ? ?ROS: Patient denies fever, rash, sore throat, blurred vision, dizziness, nausea, vomiting, diarrhea, cough, shortness of breath or chest pain,  neck pain, headache, or mood change. +residual limb pain ? ? ?Objective: ?  ?No results found. ?Recent Labs  ?  07/03/21 ?0714  ?WBC 14.6*  ?HGB 9.9*  ?HCT 30.4*  ?PLT 705*  ? ?Recent Labs  ?  07/03/21 ?0714 07/05/21 ?0711  ?NA 130* 128*  ?K 4.7 4.7  ?CL 99 94*  ?CO2 25 27  ?GLUCOSE 168* 168*  ?BUN 13 17  ?CREATININE 0.66 0.63  ?CALCIUM 8.1* 8.4*  ? ? ?Intake/Output Summary (Last 24 hours) at 07/05/2021 1657 ?Last data filed at 07/05/2021 1356 ?Gross per 24 hour  ?Intake 420 ml  ?Output 700 ml  ?Net -280 ml  ?  ? ?  ? ?Physical Exam: ?Vital Signs ?Blood pressure 137/63, pulse 84, temperature 98.2 ?F (36.8 ?C), temperature source Oral, resp. rate 16, height 6' (1.829 m), weight 96.7 kg, SpO2 98 %. ? ?Constitutional: No distress . Vital signs reviewed. ?HEENT: NCAT, EOMI, oral membranes moist ?Neck: supple ?Cardiovascular: RRR without murmur. No JVD    ?Respiratory/Chest: CTA Bilaterally without wheezes or rales. Normal effort    ?GI/Abdomen: BS +, non-tender, non-distended ?Ext: no clubbing, cyanosis, or edema ?Psych: pleasant and cooperative  ?Skin: mild serosang drainage from incisions along medial aspect ? ? ?Neuro:  Alert and oriented x 3. Normal insight and awareness. Intact Memory. Normal language and speech. Cranial nerve exam unremarkable. Moves all 4's, right leg limted somewhat by pain ?Musculoskeletal: right BK stump well shaped, tolerating shrinker, edema improving ? ? ?Assessment/Plan: ?1. Functional deficits which require 3+ hours per day of interdisciplinary therapy in a comprehensive inpatient rehab setting. ?Physiatrist is  providing close team supervision and 24 hour management of active medical problems listed below. ?Physiatrist and rehab team continue to assess barriers to discharge/monitor patient progress toward functional and medical goals ? ?Care Tool: ? ?Bathing ?   ?Body parts bathed by patient: Right arm, Left arm, Chest, Abdomen, Front perineal area, Right upper leg, Left upper leg, Left lower leg, Face  ? Body parts bathed by helper: Buttocks ?Body parts n/a: Right lower leg ?  ?Bathing assist Assist Level: Minimal Assistance - Patient > 75% ?  ?  ?Upper Body Dressing/Undressing ?Upper body dressing   ?What is the patient wearing?: Pull over shirt ?   ?Upper body assist Assist Level: Contact Guard/Touching assist ?   ?Lower Body Dressing/Undressing ?Lower body dressing ? ? ?   ?What is the patient wearing?: Pants ? ?  ? ?Lower body assist Assist for lower body dressing: Minimal Assistance - Patient > 75% ?   ? ?Toileting ?Toileting Toileting Activity did not occur (Probation officer and hygiene only): N/A (no void or bm)  ?Toileting assist Assist for toileting: Maximal Assistance - Patient 25 - 49% ?  ?  ?Transfers ?Chair/bed transfer ? ?Transfers assist ?   ? ?Chair/bed transfer assist level: Minimal Assistance - Patient > 75% ?  ?  ?  Locomotion ?Ambulation ? ? ?Ambulation assist ? ? Ambulation activity did not occur: Safety/medical concerns (max assist to stand, unable to achieve full upright w/RW) ? ?  ?  ?   ? ?Walk 10 feet activity ? ? ?Assist ? Walk 10 feet activity did not occur: Safety/medical concerns ? ?  ?   ? ?Walk 50 feet activity ? ? ?Assist Walk 50 feet with 2 turns activity did not occur: Safety/medical concerns ? ?  ?   ? ? ?Walk 150 feet activity ? ? ?Assist Walk 150 feet activity did not occur: Safety/medical concerns ? ?  ?  ?  ? ?Walk 10 feet on uneven surface  ?activity ? ? ?Assist Walk 10 feet on uneven surfaces activity did not occur: Safety/medical concerns ? ? ?  ?   ? ?Wheelchair ? ? ? ? ?Assist  Is the patient using a wheelchair?: Yes ?Type of Wheelchair: Manual ?  ? ?Wheelchair assist level: Supervision/Verbal cueing ?Max wheelchair distance: 50  ? ? ?Wheelchair 50 feet with 2 turns activity ? ? ? ?Assist ? ?  ?  ? ? ?Assist Level: Supervision/Verbal cueing  ? ?Wheelchair 150 feet activity  ? ? ? ?Assist ?   ? ? ?Assist Level: Maximal Assistance - Patient 25 - 49%  ? ?Blood pressure 137/63, pulse 84, temperature 98.2 ?F (36.8 ?C), temperature source Oral, resp. rate 16, height 6' (1.829 m), weight 96.7 kg, SpO2 98 %. ? ?Medical Problem List and Plan: ?1. 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            -patient may shower but incision must be covered.  ?            -ELOS/Goals: 8-12 days modI ?        -Continue CIR therapies including PT, OT  ? Discussed with wife and patient that they should start learning dressing changes daily to prepare for home.  ?2.  Antithrombotics: ?-DVT/anticoagulation:  Pharmaceutical: Heparin ?            -antiplatelet therapy: Aspirin 81 mg daily ?3. Phantom limb pain: Added additional '100mg'$  Neurontin HS. Continue Neurontin 300 mg 3 times daily  ?4. Mood/sleep: team providing egosupport ? -continue melatonin '5mg'$  qhs to assist sleep ?            -antipsychotic agents: N/A ?5. Neuropsych: This patient is capable of making decisions on his own behalf. ?6. Skin/Wound Care: Routine skin checks ?7. Fluids/Electrolytes/Nutrition: Routine in and outs with follow-up chemistries ?8.  Acute blood loss anemia.   ?3/25 Follow-up CBC  reviewed ?  -hgb stable at 9.9 ?9.  NSVT/CAD.  Follow cardiology service.  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  Continue Lopressor 25 mg every 8 hours and considering consolidating to Toprol on discharge.  Follow-up outpatient ?10.  Coffee-ground emesis x1 3/14.  H&H stable.  Plan outpatient follow-up GI services as needed.  Continue Protonix twice daily. ? -asymptomatic ?11.  History of  BPH/urinary retention.  Hytrin 10 mg nightly.  Foley tube remains in place   ? 3/26 discussed foley with pt. He agrees to remove. ? -oob/eob to empty ? -timed voids ?12.  Hyperlipidemia.  Crestor ?13.  Hypertension.  Norvasc 10 mg daily, Avapro 300 mg daily.  Monitor with increased mobility ? 3/26 bp controlled ?14.  CLL.  Follow-up outpatient Dr. Alen Blew.  Continue IMBRUVICA 420 mg daily. PATIENT CAN BRING IN OWN SUPPLY IF NEEDED. ?15.  Constipation.  Colace 100 mg twice daily ? -had large BM 3/25 ?16.  Hyperglycemia.  Hemoglobin A1c 6.7.  SSI.  Patient with no documented history of diabetes. ?17. Leukocytosis: repeat tomorrow to trend since elevated 3/24 ?18. Transaminitis: stopped tylenol. Repeat CMP tomorrow.  ?19. Residual limb pain: continue Robaxin 500 mg 3 times daily, oxycodone as needed. Add vitamin C '1000mg'$  daily.  ?20. Hyponatremia ? -may be related to CLL vs meds ? -recheck tomorrow.  ? ?  ? ?LOS: ?4 days ?A FACE TO FACE EVALUATION WAS PERFORMED ? ?Martha Clan P Lars Jeziorski ?07/05/2021, 4:57 PM  ? ?  ?

## 2021-07-05 NOTE — Consult Note (Signed)
WOC Nurse Consult Note: ?Patient receiving care in [facility] [phone] ?Reason for Consult: R leg incisions, new BKA ?Wound type: 3 surgical incisions on the medial upper side of the right BKA. The upper incisions appear to have been closed with glue and are dry at this time. The lower incision medially just above the amputation site is surrounded by erythema and has serous drainage on the dressing. No odor noted. The anterior tibial blister has drained and just next to that is an area of warmth and bogginess that is concerning. I have sent [place] to Dr. Ranell Patrick to assess and decide if Dr. Donzetta Matters or someone with vascular should come and look at it. ?Pressure Injury POA: NA ?Dressing procedure/placement/frequency: ?Clean the medial incision site with NS and pat dry. Apply a thin coat of Mupirocin ointment over the incision and cover with a dry gauze, then use paper tape to secure in place. Apply stump sock back over the area. ? ?Monitor the wound area(s) for worsening of condition such as: ?Signs/symptoms of infection, increase in size, development of or worsening of odor, ?development of pain, or increased pain at the affected locations.   ?Notify the medical team if any of these develop. ? ?Thank you for the consult. [place] nurse will not follow at this time.   ?Please re-consult the [place] team if needed. ? ?Cathlean Marseilles. Tamala Julian, MSN, RN, CMSRN, AGCNS, WTA ?Wound Treatment Associate ?Pager [phone]   ? ? ?  ?

## 2021-07-05 NOTE — Progress Notes (Signed)
Occupational Therapy Session Note ? ?Patient Details  ?Name: Douglas [name] ?MRN: [id number] ?Date of Birth: [birth year] ? ?Today's Date: 07/05/2021 ?OT Individual Time: 2440-1027 ?OT Individual Time Calculation (min): 38 min  ? ? ?Short Term Goals: ?Week 1:  OT Short Term Goal 1 (Week 1): Patient will don LB clothing with Min A and lateral leans. ?OT Short Term Goal 2 (Week 1): Patient will complete 2/3 parts of toileting task with Min A and LRAD. ?OT Short Term Goal 3 (Week 1): Patient will completed 2/3 parts of toileting task with with Min A and LRAD. ? ?Skilled Therapeutic Interventions/Progress Updates:  ?Skilled OT intervention completed with focus on toileting, ADL retraining. Pt received supine in bed, with nursing in room providing meds. Pt stating "you need to check me and see if I need to be change." Pt denied being able to feel when he needs to go, and was found to have loose BM in brief, but was able to verbalize need to urinate with urinal urgently. Education provided to pt about OOB toileting for increasing functional abilities, with ability to complete with nursing during night time or any time of day with pt stating "oh I thought I had to use the brief." Pt denied getting OOB for [facility] toileting, therefore completed all pericare and dressing at bed level. Completed rolling L<>R with supervision, with total A needed for pericare and threading of new brief/chuck pad. Pt donned pants with mod A with mod encouragement to participate via rolls, with assist needed for threading R residual limb and donning over buttocks. CGA needed for donning shirt with pt able to sit upright in bed with use of bed rails with min A. Pt was able to boost himself back up towards [place]. Washed face with set up A. Pt was left upright in bed, with bed alarm on, and all needs in reach at end of session. ? ? ?Therapy Documentation ?Precautions:  ?Precautions ?Precautions: Fall ?Precaution Comments: R BKA 3/17 ?Required Braces or  Orthoses: Other Brace ?Other Brace: limb guard ?Restrictions ?Weight Bearing Restrictions: Yes ?RLE Weight Bearing: Non weight bearing ? ?Pain: ?No c/o pain ? ? ?Therapy/Group: Individual Therapy ? ?Nastassja Witkop E Blimy Napoleon ?07/05/2021, 8:05 AM ?

## 2021-07-05 NOTE — Progress Notes (Signed)
Physical Therapy Session Note ? ?Patient Details  ?Name: Douglas [name] ?MRN: [id number] ?Date of Birth: 07-Jul-1950 ? ?Today's Date: 07/05/2021 ?PT Individual Time: 2637-8588 and 5027-7412 ?PT Individual Time Calculation (min): 54 min and 64 min ?PT Missed Time: 11 minutes ?PT Missed Time Reason: Fatigue ? ?Short Term Goals: ?Week 1:  PT Short Term Goal 1 (Week 1): Pt will perform sit to stand from wc w/mod assist of 1 ?PT Short Term Goal 2 (Week 1): pt will transfer bed to/from wc w/LRAD and mod assist ?PT Short Term Goal 3 (Week 1): Pt will propel wc 152f w/additional time ?PT Short Term Goal 4 (Week 1): Pt will tolerate gait assessment w/LRAD ? ?Skilled Therapeutic Interventions/Progress Updates:  ? Treatment Session 1 ?Received pt supine in bed, pt agreeable to PT treatment, and reported pain 5/10 in R residual limb (premedicated). Pt upset and frustrated regarding frequent and uncontrollable bowel and bladder episodes occurring every hour and requesting to remain in room for therapy. Session with emphasis on functional mobility/transfers, generalized strengthening and ROM, dynamic standing balance/coordination. Pt transferred semi-reclined<>sitting EOB with HOB elevated and use of bedrails with min A for trunk control. Pt reported mild lightheadedness and increased pain in residual limb in dependent position and refused to wear limb guard due to pain - lightheadedness improved with rest. Donned non-skid sock and L shoe with max A and pt stood with RW and mod A x 2 trials from elevated EOB - cues for hand placement on RW for safety. Pt only able to remain standing ~15 seconds prior to needing to sit and rest. Of note, pt demonstrating slightly decreased awareness, thinking it would be easier for him to [facility] and telling therapist it would be better to "scoot" foot across the floor rather than clear it when therapist demonstrated "walking/hopping" technique to work on in future  sessions. Pt declined any further standing due to pain and fatigue. Doffed dirty scrub shirt and donned clean one with min A and pt transferred sit<>supine with supervision. Pt performed the following exercises with supervision and verbal cues for technique with emphasis on LE strength/ROM: ?-SLR 2x10 bilaterally ?-hip abduction 2x10 bilaterally ?-hip adduction pillow squeezes 2x15 with 2-3 second isometric hold  ?-LLE marches from hooklying 2x20 ?-bicep curls with red TB 2x15 bilaterally  ?-hip abduction with red TB 2x15 ?Pt reported sudden urge to urinate and able to set up urinal without assist. Discussed transferring to bedside commode but pt politely declined stating that the urinal was "faster" for now. Pt did report pain prior to urination and delayed ability to void - notified MD for c/o potential UTI. Discussed equipment for D/C with pt reporting not having anything but wanting a power WC. Informed pt that if he receives a power WC he will more than likely not be able to receive prosthetic - pt reports he will "think on it". Concluded session with pt semi-reclined in bed, needs within reach, and bed alarm on.  ? ?Treatment Session 2 ?Received pt semi-reclined in bed asleep, upon wakening pt agreeable to PT treatment, and reported pain 7/10 in R residual limb (premedicated) - repositioning, rest breaks, and distraction done to reduce pain levels. Session with emphasis on functional mobility/transfers, generalized strengthening and endurance, dynamic sitting balance/coordination. Donned pants in supine with mod A and pt able to bridge hips for therapist to pull pants over hips. Pt transferred semi-reclined<>sitting EOB woth HOB elevated and use of bedrails with supervision - 2 posterior LOB  but pt able to self-correct. Donned non-skid sock and L shoe with max A and pt transferred bed<>WC via slideboard with CGA per pt request. Pt performed WC mobility 7f using BUE and supervision - limited by fatigue  therefore transported remainder of way [facility] purposes. Pt performed LLE strengthening on Kinetron at 20 cm/sec for 1 minute x 3 trials with therapist providing manual counter resistance with emphasis on glute/quad strength. [facility], however pt unable to stand despite max A - pt reported just having taken his "cancer pill" that makes him weak. Therefore transferred WC<>mat via slideboard and CGA with total A to place board. Pt then declined any standing activities due to fatigue; therfore performed the following exercises with emphasis on LE strength/ROM: ?-knee extensions x10 on RLE and 2x12 with 1lb ankle weight on LLE ?-hip flexion x10 on RLE and 2x12 with 1lb ankle weight on LLE ?-WC push ups 2x8 ?Of note, pt unable to tolerate LLE in dependent position - therefore rested in extension on WC cushion for support with exercies. Pt transferred back into WC via slideboard with CGA and requested to return to room due to fatigue. Returned to room and transferred WC<>bed via slideboard and CGA and doffed L sock/shoe with max A and transferred sit<>supine with supervision. Pt reported soiling brief; doffed pants and dirty brief with max A (but pt able to bridge hips to assist), performed peri-care dependently, and donned clean brief with max A. Concluded session with pt supine in bed, needs within reach, and bed alarm on. Provided pt with fresh ice water. 11 minutes missed of skilled physical therapy due to fatigue.  ? 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 ?Therapy/Group: Individual Therapy ?ABlenda Nicely?ABecky SaxPT, DPT  ?07/05/2021, 7:20 AM  ?

## 2021-07-06 DIAGNOSIS — Z89511 Acquired absence of right leg below knee: Secondary | ICD-10-CM | POA: Diagnosis not present

## 2021-07-06 LAB — VITAMIN D 25 HYDROXY (VIT D DEFICIENCY, FRACTURES): Vit D, 25-Hydroxy: 15.3 ng/mL — ABNORMAL LOW (ref 30–100)

## 2021-07-06 LAB — COMPREHENSIVE METABOLIC PANEL
ALT: 112 U/L — ABNORMAL HIGH (ref 0–44)
AST: 44 U/L — ABNORMAL HIGH (ref 15–41)
Albumin: 2.2 g/dL — ABNORMAL LOW (ref 3.5–5.0)
Alkaline Phosphatase: 218 U/L — ABNORMAL HIGH (ref 38–126)
Anion gap: 8 (ref 5–15)
BUN: 18 mg/dL (ref 8–23)
CO2: 28 mmol/L (ref 22–32)
Calcium: 8.5 mg/dL — ABNORMAL LOW (ref 8.9–10.3)
Chloride: 93 mmol/L — ABNORMAL LOW (ref 98–111)
Creatinine, Ser: 0.7 mg/dL (ref 0.61–1.24)
GFR, Estimated: >60 mL/min (ref >60)
Glucose, Bld: 181 mg/dL — ABNORMAL HIGH (ref 70–99)
Potassium: 4.4 mmol/L (ref 3.5–5.1)
Sodium: 129 mmol/L — ABNORMAL LOW (ref 135–145)
Total Bilirubin: 1 mg/dL (ref 0.3–1.2)
Total Protein: 5.5 g/dL — ABNORMAL LOW (ref 6.5–8.1)

## 2021-07-06 LAB — CBC WITH DIFFERENTIAL/PLATELET
Abs Immature Granulocytes: 0.26 10*3/uL — ABNORMAL HIGH (ref 0.00–0.07)
Basophils Absolute: 0.1 10*3/uL (ref 0.0–0.1)
Basophils Relative: 0 %
Eosinophils Absolute: 0 10*3/uL (ref 0.0–0.5)
Eosinophils Relative: 0 %
HCT: 30.6 % — ABNORMAL LOW (ref 39.0–52.0)
Hemoglobin: 10 g/dL — ABNORMAL LOW (ref 13.0–17.0)
Immature Granulocytes: 2 %
Lymphocytes Relative: 11 %
Lymphs Abs: 1.8 10*3/uL (ref 0.7–4.0)
MCH: 29.2 pg (ref 26.0–34.0)
MCHC: 32.7 g/dL (ref 30.0–36.0)
MCV: 89.2 fL (ref 80.0–100.0)
Monocytes Absolute: 0.7 10*3/uL (ref 0.1–1.0)
Monocytes Relative: 4 %
Neutro Abs: 13.8 10*3/uL — ABNORMAL HIGH (ref 1.7–7.7)
Neutrophils Relative %: 83 %
Platelets: 750 10*3/uL — ABNORMAL HIGH (ref 150–400)
RBC: 3.43 MIL/uL — ABNORMAL LOW (ref 4.22–5.81)
RDW: 16.1 % — ABNORMAL HIGH (ref 11.5–15.5)
WBC: 16.5 10*3/uL — ABNORMAL HIGH (ref 4.0–10.5)
nRBC: 0 % (ref 0.0–0.2)

## 2021-07-06 LAB — GLUCOSE, CAPILLARY
Glucose-Capillary: 189 mg/dL — ABNORMAL HIGH (ref 70–99)
Glucose-Capillary: 210 mg/dL — ABNORMAL HIGH (ref 70–99)
Glucose-Capillary: 161 mg/dL — ABNORMAL HIGH (ref 70–99)
Glucose-Capillary: 224 mg/dL — ABNORMAL HIGH (ref 70–99)

## 2021-07-06 LAB — VITAMIN B12: Vitamin B-12: 335 pg/mL (ref 180–914)

## 2021-07-06 MED ORDER — LIDOCAINE HCL URETHRAL/MUCOSAL 2 % EX GEL
1.0000 "application " | CUTANEOUS | Status: DC | PRN
  Administered 2021-07-07 – 2021-07-15 (×4): 1 via URETHRAL
  Filled 2021-07-06 (×9): qty 6

## 2021-07-06 MED ORDER — AMITRIPTYLINE HCL 10 MG PO TABS
10.0000 mg | ORAL_TABLET | Freq: Every day | ORAL | Status: DC
Start: 2021-07-06 — End: 2021-07-09
  Administered 2021-07-06 – 2021-07-08 (×3): 10 mg via ORAL
  Filled 2021-07-06 (×3): qty 1

## 2021-07-06 MED ORDER — CEPHALEXIN 250 MG PO CAPS
500.0000 mg | ORAL_CAPSULE | Freq: Two times a day (BID) | ORAL | Status: DC
Start: 2021-07-06 — End: 2021-07-08
  Administered 2021-07-06 – 2021-07-08 (×5): 500 mg via ORAL
  Filled 2021-07-06 (×5): qty 2

## 2021-07-06 NOTE — Progress Notes (Signed)
Physical Therapy Session Note ? ?Patient Details  ?Name: Douglas [name] ?MRN: [id number] ?Date of Birth: 1950/10/27 ? ?Today's Date: 07/06/2021 ?PT Individual Time: 1275-1700 and 1749-4496 ?PT Individual Time Calculation (min): 56 min and 57 min ?PT Missed Time: 18 minutes ?PT Missed Time Reason: Fatigue ? ?Short Term Goals: ?Week 1:  PT Short Term Goal 1 (Week 1): Pt will perform sit to stand from wc w/mod assist of 1 ?PT Short Term Goal 2 (Week 1): pt will transfer bed to/from wc w/LRAD and mod assist ?PT Short Term Goal 3 (Week 1): Pt will propel wc 134f w/additional time ?PT Short Term Goal 4 (Week 1): Pt will tolerate gait assessment w/LRAD ? ?Skilled Therapeutic Interventions/Progress Updates:  ? Treatment Session 1 ?Received pt semi-reclined in bed with MD present at bedside - reported pt has UTI. Pt agreeable to PT treatment and reported pain 1/10 in R residual limb (premedicated). Of note, pt did not remember this therapist from yesterday - reminded pt of everything worked on in therapy. Session with emphasis on dressing, hygiene management, functional mobility/transfers, generalized strengthening and endurance, and dynamic standing balance/coordination. Pt reported being soiled. Pt bridged hips and therapist removed dirty brief, but noticed pt found to be incontinent of bladder and bowel and unaware. Rolled to R with supervision and performed peri-care dependently, then stopped for pt to use urinal due to sudden urge to void (however pt unsuccessful). Continued rolling in bed with supervision and donned clean brief with max A. Donned pants in supine with mod A for time management - bridged for therapist to pull over hips. Pt transferred supine<>sitting EOB with use of bedrails and min HHA. Donned L non-skid sock and shoes with max A - pt refused to wear limb guard. Attempted to stand from slightly lower EOB than yesterday but pt unable despite max A; therefore raised bed and pt stood x 1 rep from elevated  EOB with RW and mod A but only able to remain standing ~10 seconds prior to requesting to sit. Pt declined any further standing due to phantom limb pain spasms and transferred bed<>WC via slideboard with CGA and total A to place board. Pt performed WC mobility 254fusing BUE and supervision but limited by fatigue and [facility] Pt performed the following exercises with supervision and verbal cues for technique with emphasis on LE strength/ROM: ?-WC pushups 2x8 (but pt unable to clear buttocks and doing more of pelvic tilt) ?-horizontal chest press with 2lb dowel 2x10 ?-overhead chest press with 2lb dowel 2x10 ?Discussion had regarding CLOF, goals, equipment, and D/C plan with pt verbalizing to therapist that he has no desire to ambulate or continue working on standing and that he wants a power WC. Attempted to educate pt on benefits of standing and translation of strength gains to prosthetic use, but pt not receptive to education and unsure if he even wants a prosthetic - will ask CSW if pt is able to receive power WC. [facility] reach awaiting upcoming OT session.  ? ?Treatment Session 2 ?Received pt semi-reclined in bed, pt agreeable to PT treatment and reported pain 1/10 in R residual limb increasing to 3-4/10 with mobility and when residual limb is in dependent positions (premedicated). Pt reported feeling much better after being cathed this morning. Pt requested to work on day/day functional tasks and routine starting with waking up in the morning. Session with emphasis on functional mobility/transfers,  generalized strengthening and endurance, and home environment simulation. Donned pants in supine with min A to hold pant leg to get residual limb through and pt able to bridge/roll slightly to pull pants over hips without assist - discussed ordering bed assist rail if needed. Pt donned non-skid sock with  supervision and transferred supine<>sitting EOB from flat bed (to simulate home environment) and use of bedrails with supervision. Donned shoe with supervision and pt transferred bed<>WC via slideboard with CGA with min A to place slideboard. Discussed pt's 3 STE and therapist recommended getting temporary ramp installed but pt reports they are selling their house and cannot get one put in - pt insists on using EMS services and will D/C home in ambulance until they can move into their new apartment end of April (level entry and no steps). Discussed safety concerns of not being able to get out of home in care of emergency/fire but pt reports he and his wife "have it covered". Discussed practicing car transfers for when pt's wife will have to take him to/from appointments once they are moved into their new apartment. Pt performed WC mobility 85f using BUE and supervision but stopped due to fatigue and [facility]. Pt performed simulated car transfers with slideboard and CGA with total A to place and remove board - pt reports his wife will be able to assist in placing/removing board. Pt required cues for technique and safety but verbalized confidence with task. Pt reports wanting to spend time sitting in recliner at home; therefore taken to [place] apartment. Pt declined transferring into recliner in AD[place] apartment stating that his "isn't like his" and reported he has a higher armrest - discussed c/o slideboard not fitting over the arm rest but pt states he thinks it will fit on the seat surface next to it - increased time spent problem solving situation. Then educated pt on WC parts management including locking/unlocking brakes and donning/doffing legrests - pt requires min A and will benefit from further practice. Pt declined any further activities/exercises and requested to return to room. Pt transferred WC<>bed via slideboard with CGA and sit<>supine with supervision. Pt reported  soiling brief - bridged hips and removed pants and dirty brief and donned clean one with max A via rolling L/R in bed with supervision. Concluded session with pt semi-reclined in bed, needs within reach, and bed alarm on. Provided pt with fresh ice water. 18 minutes missed of skilled physical therapy due to fatigue. ? 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 ?Therapy/Group: Individual Therapy ?ABlenda Nicely?ABecky SaxPT, DPT  ?07/06/2021, 7:17 AM  ?

## 2021-07-06 NOTE — Progress Notes (Signed)
?                                                       PROGRESS NOTE ? ? ?Subjective/Complaints: ?C/o insomnia, dysuria, urinary retention ?Pain continues to be well controlled with oxycodone prn ?Will add amitriptyline '10mg'$  HS for pain and insomnia ?Wife not here today but will be here tomorrow morning ? ?ROS: Patient denies fever, rash, sore throat, blurred vision, dizziness, nausea, vomiting, diarrhea, cough, shortness of breath or chest pain,  neck pain, headache, or mood change. +residual limb pain, +insomnia, +urinary retention ? ? ?Objective: ?  ?No results found. ?Recent Labs  ?  07/06/21 ?0712  ?WBC 16.5*  ?HGB 10.0*  ?HCT 30.6*  ?PLT 750*  ? ?Recent Labs  ?  07/05/21 ?0711 07/06/21 ?0712  ?NA 128* 129*  ?K 4.7 4.4  ?CL 94* 93*  ?CO2 27 28  ?GLUCOSE 168* 181*  ?BUN 17 18  ?CREATININE 0.63 0.70  ?CALCIUM 8.4* 8.5*  ? ? ?Intake/Output Summary (Last 24 hours) at 07/06/2021 1144 ?Last data filed at 07/06/2021 1100 ?Gross per 24 hour  ?Intake 180 ml  ?Output 1050 ml  ?Net -870 ml  ?  ? ?  ? ?Physical Exam: ?Vital Signs ?Blood pressure (!) 125/57, pulse 89, temperature 98.6 ?F (37 ?C), temperature source Oral, resp. rate 18, height 6' (1.829 m), weight 96.7 kg, SpO2 96 %. ? ?Constitutional: No distress . Vital signs reviewed. ?HEENT: NCAT, EOMI, oral membranes moist ?Neck: supple ?Cardiovascular: RRR without murmur. No JVD    ?Respiratory/Chest: CTA Bilaterally without wheezes or rales. Normal effort    ?GI/Abdomen: BS +, non-tender, non-distended ?Ext: no clubbing, cyanosis, or edema ?Psych: pleasant and cooperative  ?Skin: mild serosang drainage from incisions along medial aspect ? ? ?Neuro:  Alert and oriented x 3. Normal insight and awareness. Intact Memory. Normal language and speech. Cranial nerve exam unremarkable. Moves all 4's, right leg limted somewhat by pain ?Musculoskeletal: right BK stump well shaped, tolerating shrinker, edema improving ?GU: bladder distended ? ? ?Assessment/Plan: ?1. Functional  deficits which require 3+ hours per day of interdisciplinary therapy in a comprehensive inpatient rehab setting. ?Physiatrist is providing close team supervision and 24 hour management of active medical problems listed below. ?Physiatrist and rehab team continue to assess barriers to discharge/monitor patient progress toward functional and medical goals ? ?Care Tool: ? ?Bathing ?   ?Body parts bathed by patient: Right arm, Left arm, Chest, Abdomen, Front perineal area, Right upper leg, Left upper leg, Left lower leg, Face  ? Body parts bathed by helper: Buttocks ?Body parts n/a: Right lower leg ?  ?Bathing assist Assist Level: Minimal Assistance - Patient > 75% ?  ?  ?Upper Body Dressing/Undressing ?Upper body dressing   ?What is the patient wearing?: Pull over shirt ?   ?Upper body assist Assist Level: Contact Guard/Touching assist ?   ?Lower Body Dressing/Undressing ?Lower body dressing ? ? ?   ?What is the patient wearing?: Pants ? ?  ? ?Lower body assist Assist for lower body dressing: Minimal Assistance - Patient > 75% ?   ? ?Toileting ?Toileting Toileting Activity did not occur (Probation officer and hygiene only): N/A (no void or bm)  ?Toileting assist Assist for toileting: Maximal Assistance - Patient 25 - 49% ?  ?  ?Transfers ?Chair/bed transfer ? ?Transfers  assist ?   ? ?Chair/bed transfer assist level: Contact Guard/Touching assist (slideboard) ?  ?  ?Locomotion ?Ambulation ? ? ?Ambulation assist ? ? Ambulation activity did not occur: Safety/medical concerns (max assist to stand, unable to achieve full upright w/RW) ? ?  ?  ?   ? ?Walk 10 feet activity ? ? ?Assist ? Walk 10 feet activity did not occur: Safety/medical concerns ? ?  ?   ? ?Walk 50 feet activity ? ? ?Assist Walk 50 feet with 2 turns activity did not occur: Safety/medical concerns ? ?  ?   ? ? ?Walk 150 feet activity ? ? ?Assist Walk 150 feet activity did not occur: Safety/medical concerns ? ?  ?  ?  ? ?Walk 10 feet on uneven surface   ?activity ? ? ?Assist Walk 10 feet on uneven surfaces activity did not occur: Safety/medical concerns ? ? ?  ?   ? ?Wheelchair ? ? ? ? ?Assist Is the patient using a wheelchair?: Yes ?Type of Wheelchair: Manual ?  ? ?Wheelchair assist level: Supervision/Verbal cueing ?Max wheelchair distance: 65f  ? ? ?Wheelchair 50 feet with 2 turns activity ? ? ? ?Assist ? ?  ?  ? ? ?Assist Level: Supervision/Verbal cueing  ? ?Wheelchair 150 feet activity  ? ? ? ?Assist ?   ? ? ?Assist Level: Maximal Assistance - Patient 25 - 49%  ? ?Blood pressure (!) 125/57, pulse 89, temperature 98.6 ?F (37 ?C), temperature source Oral, resp. rate 18, height 6' (1.829 m), weight 96.7 kg, SpO2 96 %. ? ?Medical Problem List and Plan: ?1. 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            -patient may shower but incision must be covered.  ?            -ELOS/Goals: 8-12 days modI ?        -Continue CIR therapies including PT, OT  ? Discussed with wife and patient that they should start learning dressing changes daily to prepare for home. Discussed with team planning for daily dressing change education at 9am with nursing ?2.  Antithrombotics: ?-DVT/anticoagulation:  Pharmaceutical: Heparin ?            -antiplatelet therapy: Aspirin 81 mg daily ?3. Phantom limb pain: Added additional '100mg'$  Neurontin HS. Continue Neurontin 300 mg 3 times daily  ?4. Insomnia: add amitriptyline '10mg'$  HS.  ? -continue melatonin '5mg'$  qhs to assist sleep ?            -antipsychotic agents: N/A ?5. Neuropsych: This patient is capable of making decisions on his own behalf. ?6. Skin/Wound Care: Routine skin checks ?7. Fluids/Electrolytes/Nutrition: Routine in and outs with follow-up chemistries ?8.  Acute blood loss anemia.   ?  -hgb stable at 9.9, monitor weekly.  ?9.  NSVT/CAD.  Follow cardiology service.  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  Continue Lopressor 25 mg every 8 hours and considering  consolidating to Toprol on discharge.  Follow-up outpatient ?10.  Coffee-ground emesis x1 3/14.  H&H stable.  Plan outpatient follow-up GI services as needed.  Continue Protonix twice daily. ? -asymptomatic ?11.  History of BPH/urinary retention.  Hytrin 10 mg nightly.  d/c foley. Lidocaine jelly ordered for urinary catheterization ? -oob/eob to empty ? -timed voids ?12.  Hyperlipidemia.  Crestor ?13.  Hypertension.  Norvasc 10 mg daily, Avapro 300 mg daily.  Monitor with increased mobility ? 3/26 bp controlled ?14.  CLL.  Follow-up  outpatient Dr. Alen Blew.  Continue IMBRUVICA 420 mg daily. PATIENT CAN BRING IN OWN SUPPLY IF NEEDED. ?15.  Constipation.  Colace 100 mg twice daily ? -had large BM 3/25 ?16.  Hyperglycemia.  Hemoglobin A1c 6.7.  SSI.  Patient with no documented history of diabetes. ?17. Leukocytosis: repeat tomorrow to trend since elevated 3/24 ?18. Transaminitis: stopped tylenol. Repeat CMP tomorrow.  ?19. Residual limb pain: continue Robaxin 500 mg 3 times daily, oxycodone as needed. Add vitamin C '1000mg'$  daily.  ?20. Hyponatremia ? -may be related to CLL vs meds ? -improved, monitor weekly ?21. UTI: Keflex ordered ? ?  ? ?LOS: ?5 days ?A FACE TO FACE EVALUATION WAS PERFORMED ? ?Martha Clan P Bryella Diviney ?07/06/2021, 11:44 AM  ? ?  ?

## 2021-07-06 NOTE — Plan of Care (Signed)
?  Problem: RH Ambulation ?Goal: LTG Patient will ambulate in controlled environment (PT) ?Description: LTG: Patient will ambulate in a controlled environment, # of feet with assistance (PT). ?Outcome: Not Applicable ?Flowsheets (Taken 07/06/2021 1222) ?LTG: Pt will ambulate in controlled environ  assist needed:: (D/C) -- ?Note: D/C ?Goal: LTG Patient will ambulate in home environment (PT) ?Description: LTG: Patient will ambulate in home environment, # of feet with assistance (PT). ?Outcome: Not Applicable ?Flowsheets (Taken 07/06/2021 1222) ?LTG: Pt will ambulate in home environ  assist needed:: (D/C) -- ?Note: D/C ?  ?Problem: RH Balance ?Goal: LTG Patient will maintain dynamic standing balance (PT) ?Description: LTG:  Patient will maintain dynamic standing balance with assistance during mobility activities (PT) ?Flowsheets (Taken 07/06/2021 1222) ?LTG: Pt will maintain dynamic standing balance during mobility activities with:: (downgraded due to fatigue, pain, weakness, poor endurance) Minimal Assistance - Patient > 75% ?Note: downgraded due to fatigue, pain, weakness, poor endurance ?  ?Problem: Sit to Stand ?Goal: LTG:  Patient will perform sit to stand with assistance level (PT) ?Description: LTG:  Patient will perform sit to stand with assistance level (PT) ?Flowsheets (Taken 07/06/2021 1222) ?LTG: PT will perform sit to stand in preparation for functional mobility with assistance level: (downgraded due to fatigue, pain, weakness, poor endurance) Minimal Assistance - Patient > 75% ?Note: downgraded due to fatigue, pain, weakness, poor endurance ?  ?Problem: RH Bed Mobility ?Goal: LTG Patient will perform bed mobility with assist (PT) ?Description: LTG: Patient will perform bed mobility with assistance, with/without cues (PT). ?Flowsheets (Taken 07/06/2021 1222) ?LTG: Pt will perform bed mobility with assistance level of: (downgraded due to fatigue, pain, weakness, poor endurance) Independent with assistive device   ?Note: downgraded due to fatigue, pain, weakness, poor endurance ?  ?Problem: RH Bed to Chair Transfers ?Goal: LTG Patient will perform bed/chair transfers w/assist (PT) ?Description: LTG: Patient will perform bed to chair transfers with assistance (PT). ?Flowsheets (Taken 07/06/2021 1222) ?LTG: Pt will perform Bed to Chair Transfers with assistance level: (downgraded due to fatigue, pain, weakness, poor endurance) Supervision/Verbal cueing ?Note: downgraded due to fatigue, pain, weakness, poor endurance ?  ?Problem: RH Wheelchair Mobility ?Goal: LTG Patient will propel w/c in controlled environment (PT) ?Description: LTG: Patient will propel wheelchair in controlled environment, # of feet with assist (PT) ?Flowsheets (Taken 07/06/2021 1222) ?LTG: Pt will propel w/c in controlled environ  assist needed:: (downgraded due to fatigue, pain, weakness, poor endurance) Supervision/Verbal cueing ?LTG: Propel w/c distance in controlled environment: 30f ?Note: downgraded due to fatigue, pain, weakness, poor endurance ?Goal: LTG Patient will propel w/c in home environment (PT) ?Description: LTG: Patient will propel wheelchair in home environment, # of feet with assistance (PT). ?Flowsheets (Taken 07/06/2021 1222) ?LTG: Pt will propel w/c in home environ  assist needed:: (downgraded due to fatigue, pain, weakness, poor endurance) Supervision/Verbal cueing ?Distance: wheelchair distance in controlled environment: 50 ?Note: downgraded due to fatigue, pain, weakness, poor endurance ?  ?

## 2021-07-06 NOTE — Progress Notes (Signed)
Occupational Therapy Session Note ? ?Patient Details  ?Name: Douglas [name] ?MRN: [id number] ?Date of Birth: 1950/11/02 ? ?Today's Date: 07/06/2021 ?OT Individual Time: 1000-1045 ?OT Individual Time Calculation (min): 45 min  and Today's Date: 07/06/2021 ?OT Missed Time: 15 Minutes ?Missed Time Reason: Nursing care ? ? ?Short Term Goals: ?Week 1:  OT Short Term Goal 1 (Week 1): Patient will don LB clothing with Min A and lateral leans. ?OT Short Term Goal 2 (Week 1): Patient will complete 2/3 parts of toileting task with Min A and LRAD. ?OT Short Term Goal 3 (Week 1): Patient will completed 2/3 parts of toileting task with with Min A and LRAD. ? ?Skilled Therapeutic Interventions/Progress Updates:  ?Skilled OT intervention completed with focus on functional transfers, skin inspection. Pt received seated in w/c, agreeable to session. Reported that nursing is going to check him for full bladder, with nursing in room to bladder scan pt with pt full. Nursing requesting pt to return pt to bed for catheterization. Completed slideboard transfer to L side with total A for board placement, min A for sliding, cues needed for hand placement and postural control as pt wanted to lay back immediately as he came to sitting on bed, with mod A needed for correcting balance and safety cues needed. Min A needed for repositioning in bed for LLE up onto bed. Total A for doffing shoe, then pt was able to doff pants with min A at bed level. With nursing in room for cath procedure, therapist planning to revisit once done. ? ?After cathing, therapist provided warm soapy wash cloth for front pericare with pt able to complete with set up A, then pt able to roll R<>L to doff/donn new brief with total A. Pt able to lean forward with HOB elevated for donning of new shirt with occasional CGA to min A needed for sitting balance. Pt able to reposition with min A, then was left upright in bed with lower bed flat for contracture prevention, bed alarm on  and all needs in reach at end of session. ? ? 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 ?Pain: ?No c/o pain ? ? ?Therapy/Group: Individual Therapy ? ?Zofia Peckinpaugh E Jex Strausbaugh ?07/06/2021, 7:43 AM ?

## 2021-07-07 DIAGNOSIS — Z89511 Acquired absence of right leg below knee: Secondary | ICD-10-CM | POA: Diagnosis not present

## 2021-07-07 LAB — GLUCOSE, CAPILLARY
Glucose-Capillary: 175 mg/dL — ABNORMAL HIGH (ref 70–99)
Glucose-Capillary: 161 mg/dL — ABNORMAL HIGH (ref 70–99)
Glucose-Capillary: 185 mg/dL — ABNORMAL HIGH (ref 70–99)
Glucose-Capillary: 154 mg/dL — ABNORMAL HIGH (ref 70–99)

## 2021-07-07 MED ORDER — GABAPENTIN 300 MG PO CAPS
300.0000 mg | ORAL_CAPSULE | Freq: Two times a day (BID) | ORAL | Status: DC
  Administered 2021-07-08 (×2): 300 mg via ORAL
  Filled 2021-07-07 (×2): qty 1

## 2021-07-07 MED ORDER — GABAPENTIN 300 MG PO CAPS
300.0000 mg | ORAL_CAPSULE | Freq: Every day | ORAL | Status: DC
Start: 2021-07-07 — End: 2021-07-07

## 2021-07-07 MED ORDER — GABAPENTIN 300 MG PO CAPS
600.0000 mg | ORAL_CAPSULE | Freq: Every day | ORAL | Status: DC
  Administered 2021-07-07 – 2021-07-08 (×2): 600 mg via ORAL
  Filled 2021-07-07 (×2): qty 2

## 2021-07-07 NOTE — Progress Notes (Signed)
Orthopedic Tech Progress Note ?Patient Details:  ?Konor Noren ?Apr 27, 1950 ?[id number] ? ?Called in order to [employer] for a 4 pair of SHRINKERS ? ?Patient ID: Sosaia Pittinger, male   DOB: 1950-10-07, 71 y.o.   MRN: [id number] ? ?Janit Pagan ?07/07/2021, 11:15 AM ? ?

## 2021-07-07 NOTE — Progress Notes (Signed)
Occupational Therapy Session Note ? ?Patient Details  ?Name: Douglas [name] ?MRN: [id number] ?Date of Birth: 12/07/1950 ? ?Today's Date: 07/07/2021 ?OT Individual Time: 7412-8786 ?OT Individual Time Calculation (min): 39 min  ? ? ?Short Term Goals: ?Week 1:  OT Short Term Goal 1 (Week 1): Patient will don LB clothing with Min A and lateral leans. ?OT Short Term Goal 2 (Week 1): Patient will complete 2/3 parts of toileting task with Min A and LRAD. ?OT Short Term Goal 3 (Week 1): Patient will completed 2/3 parts of toileting task with with Min A and LRAD. ? ?Skilled Therapeutic Interventions/Progress Updates:  ?Skilled OT intervention completed with focus on toilet transfers. Pt received seated in power chair, agreeable to session. Education provided on pros/cons of power chair with pt stating "after using it a little bit I don't think I like it." Encouraged pt to try slideboard transfer to [facility] to further see if he liked the power chair vs manual, with total A needed for adjusting the chair for level surface and upright position/lowered foot plates, total A needed for board placement but pt [name] to transfer > L with CGA due to slight decline slope with safety cues needed.  ? ?Toileting education provided on how to doff/donn and complete pericare while on commode. Education also provided on how to purchase DME and options. Therapist updated pt's safety sheet for nursing to "+2 assist for slideboard transfer to [facility]" as pt is not completing stand pivots at this time due to preference/safety. ? ?From seated on BSC, total A needed for board placement, then transferred with supervision > L to EOB, with cues needed to continue sliding off board before impulsively laying down which pt has done on several accounts, demonstrating poor recall. Note- pt also does not remember primary therapist after several times working together indicating potential cognitive deficits.  ? ?Pt completed bed mobility with min A for RLE  management, but [name] to scoot self up towards [place]. Pt doffed pants with mod I. Pt was left upright in bed with bed alarm on and all needs in reach at end of session. Therapist removed power chair from pt's room as pt verbalized "take that thing out, I don't like it."  ? ? 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 ?Pain: ?No c/o pain ? ? ?Therapy/Group: Individual Therapy ? ?Endya Austin E Sadye Kiernan ?07/07/2021, 12:44 PM ?

## 2021-07-07 NOTE — Progress Notes (Signed)
Physical Therapy Session Note ? ?Patient Details  ?Name: Douglas [name] ?MRN: [id number] ?Date of Birth: 22-May-1950 ? ?Today's Date: 07/07/2021 ?PT Individual Time: 2423-5361 ?PT Individual Time Calculation (min): 56 min  ?Today's Date: 07/07/2021 ?PT Missed Time: 19 Minutes ?Missed Time Reason: Patient fatigue ? ?Short Term Goals: ?Week 1:  PT Short Term Goal 1 (Week 1): Pt will perform sit to stand from wc w/mod assist of 1 ?PT Short Term Goal 2 (Week 1): pt will transfer bed to/from wc w/LRAD and mod assist ?PT Short Term Goal 3 (Week 1): Pt will propel wc 134f w/additional time ?PT Short Term Goal 4 (Week 1): Pt will tolerate gait assessment w/LRAD ? ?Skilled Therapeutic Interventions/Progress Updates:  ? Received pt semi-reclined in bed with wife, MD, and RN present at bedside - RN completing dressing education with wife. Pt agreeable to PT treatment and reported low pain levels in residual limb (unrated), but continues to increased with mobility. Pt reported being exhausted this morning after getting cathed twice last night and having "no energy". Session with emphasis on dressing, family education training, functional mobility/transfers, generalized strengthening and endurance, and amputee education. Educated pt/wife on sElectronics engineerand notified MD to order additional 4x shrinkers; pt is currently unable to tolerate 3x despite education on importance of compression to shape limb. Donned pants in supine with supervision - cues to put in R residual limb in first. Pt transferred supine<>sitting EOB from flat bed using bedrails and min A. Of note pt very unsteady this morning sitting up, loosing balance to L and falling over twice - discussed getting bed assist rail at home. Donned L non-skid sock and shoe with max A per pt request and discussed getting a sock aid. Pt transferred bed<>WC via slideboard with CGA and total A to place board - educated pt's wife on angling of slideboard and set up of WC for  transfer. Suggested pt/wife practicing transfer back to bed. Pt able to direct care to wife but required assist from therapist for [facility] management (legrests and armrests). Pt's wife placed slideboard and pt transferred back to bed with close supervision provided by wife. Pt politely declined any further transfers, insisting on returning to bed; sit<>supine with supervision and doffed pants with supervision via bridging. Pt's wife left session early to go to work but plans on coming back for more training on Friday. Updated pt's wife on current process with power WC and waiting to hear back from CSW to see if it will be covered by insurance. Pt agreeable to a "few" leg exercises and performed the following exercises with supervision and verbal cues for technique with emphasis on LE strength/ROM: ?-SLR 2x10 bilaterally ?-hip abduction 2x10 bilaterally  ?-hip abduction with red TB 2x12 ?-bicep curls with red TB 2x20 bilaterally  ?Concluded session with pt semi-reclined in bed, needs within reach, and bed alarm on. 19 minutes missed of skilled physical therapy due to fatigue.  ? 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 ?Therapy/Group: Individual Therapy ?ABlenda Nicely?ABecky SaxPT, DPT  ?07/07/2021, 7:17 AM  ?

## 2021-07-07 NOTE — Patient Care Conference (Signed)
Inpatient RehabilitationTeam Conference and Plan of Care Update ?Date: 07/07/2021   Time: 11:49 AM  ? ? ?Patient Name: Douglas [name]      ?Medical Record Number: [id number]  ?Date of Birth: 1950/12/14 ?Sex: Male         ?Room/Bed: 0Y77A/1O87O-67 ?Payor Info: Payor: Theme park manager / Plan: Theme park manager OTHER / Product Type: *No Product type* /   ? ?Admit Date/Time:  07/01/2021  4:22 PM ? ?Primary Diagnosis:  Right below-knee amputee ([place]) ? ?Hospital Problems: Principal Problem: ?  Right below-knee amputee [facility]) ?Active Problems: ?  PAD (peripheral artery disease) ([place]) ?  Post-op pain ?  Urine retention ? ? ? ?Expected Discharge Date: Expected Discharge Date: 07/16/21 ? ?Team Members Present: ?Physician leading conference: Dr. Leeroy Cha ?Social Worker Present: Erlene Quan, BSW ?Nurse Present: Dorien Chihuahua, RN ?PT Present: Becky Sax, PT ?OT Present: Laverle Hobby, OT ?SLP Present: Other (comment) Romelle Starcher SLP) ?PPS Coordinator present : Ileana Ladd, PT ? ?   Current Status/Progress Goal Weekly Team Focus  ?Bowel/Bladder ? ? Incontinent of bowel, urinary retention, requiring I/O cath Q8hr.  Regain continence of bowel, urinary retention resolved.  Continue I/O caths,   ?Swallow/Nutrition/ Hydration ? ?           ?ADL's ? ? Mod A LB dressing, Min A UB dressing, Mod A bathing, Total A toileting (self limiting, and has declined OOB toileting with me)  supervision to mod I, planning to downgrade  functional endurance and transfers, ADL retraining   ?Mobility ? ? bed mobility supervision/min A, slideboard transfers CGA, sit<>stands with RW from elevated EOB with mod A, unable/unwilling to ambulate at this time  CGA dynamic standing, mod I WC mobility  functional mobility/transfers, pain management, dynamic standing balance/coordination, endurance, D/C planning   ?Communication ? ?           ?Safety/Cognition/ Behavioral Observations ?           ?Pain ? ? C/o pain to right stump and pain during I/O  caths. PRN medications available and effective.  Pain </=3/10  Assess qshift and prn   ?Skin ? ? Right BKA, multiple incisons on right inner thigh.  Promote healing and prevention of infection  Assess Qshift and prn   ? ? ?Discharge Planning:  ?discharging home with spouse who [facility]   ?Team Discussion: ?Patient continues treatment/dressing changes for blister. Neuropathic pain addressed. Plan management of DM with oral agents; off insulin for discharge.  ?Patient on target to meet rehab goals: ?yes, currently with cognitive issues and resistant to recommendation. Needs mod I for w/c mobility. Needs mod assist for lower body care and total assist for toileting. Still requires I+O caths; MD adjusting meds.  ? ?*See Care Plan and progress notes for long and short-term goals.  ? ?Revisions to Treatment Plan:  ?Patient requesting power chair due to medical issues, fatigue, etc. ?Discussion of w/c specks vs power chair and home environment limitations ?  ?Teaching Needs: ?Safety, skin care, medications, toileting, etc.  ?Current Barriers to Discharge: ?Decreased caregiver support and Home enviroment access/layout ? ?Possible Resolutions to Barriers: ?Family education ?HH follow up services ?DME: slideboard, DA-BSC ?  ? ? Medical Summary ?Current Status: overweight, type 2 DM, insomnia, residual limb pain and neuropathic pain, urinary retention ? Barriers to Discharge: Medical stability;New diabetic ? Barriers to Discharge Comments: overweight, type 2 DM, insomnia, residual limb pain and neuropathic pain, urinary retention ?Possible Resolutions to [employer]: continue to monitor his weight, continue amitriptyline HS, increase  gabapentin to '600mg'$  HS, add flomax 0.'4mg'$  HS, continue mirror therapy, wean off insulin and add metformin ? ? ?Continued Need for Acute Rehabilitation Level of Care: The patient requires daily medical management by a physician with specialized training in physical medicine and rehabilitation  for the following reasons: ?Direction of a multidisciplinary physical rehabilitation program to maximize functional independence : Yes ?Medical management of patient stability for increased activity during participation in an intensive rehabilitation regime.: Yes ?Analysis of laboratory values and/or radiology reports with any subsequent need for medication adjustment and/or medical intervention. : Yes ? ? ?I attest that I was present, lead the team conference, and concur with the assessment and plan of the team. ? ? ?Dorien Chihuahua B ?07/07/2021, 4:02 PM  ? ? ? ? ? ? ?

## 2021-07-07 NOTE — Progress Notes (Signed)
?  Progress Note ? ? ? ?07/07/2021 ?7:56 AM ?* No surgery found * ? ?Subjective:  sleeping when I entered room. No complaints ? ? ?Vitals:  ? 07/06/21 1929 07/07/21 0435  ?BP: 124/61 (!) 109/57  ?Pulse: 95 90  ?Resp: 15 15  ?Temp: 97.7 ?F (36.5 ?C) 98.6 ?F (37 ?C)  ?SpO2: 94% 95%  ? ?Physical Exam: ?Cardiac:  regular ?Lungs:  non labored  ?Incisions:  right BKA intact with staples. Flaps appear viable.There is some erythema over anterior distal tibia. there is some mild separation of medial popliteal incision with some bloody drainage. Blister on anterior tibia. Placed 4x4s and stump sock.  ? ? ? ?Neurologic: alert and oriented ? ?CBC ?   ?Component Value Date/Time  ? WBC 16.5 (H) 07/06/2021 1572  ? RBC 3.43 (L) 07/06/2021 6203  ? HGB 10.0 (L) 07/06/2021 5597  ? HGB 14.9 05/04/2021 0752  ? HCT 30.6 (L) 07/06/2021 4163  ? PLT 750 (H) 07/06/2021 8453  ? PLT 352 05/04/2021 0752  ? MCV 89.2 07/06/2021 0712  ? MCH 29.2 07/06/2021 0712  ? MCHC 32.7 07/06/2021 0712  ? RDW 16.1 (H) 07/06/2021 6468  ? LYMPHSABS 1.8 07/06/2021 0712  ? MONOABS 0.7 07/06/2021 0321  ? EOSABS 0.0 07/06/2021 2248  ? BASOSABS 0.1 07/06/2021 2500  ? ? ?BMET ?   ?Component Value Date/Time  ? NA 129 (L) 07/06/2021 3704  ? K 4.4 07/06/2021 0712  ? CL 93 (L) 07/06/2021 8889  ? CO2 28 07/06/2021 0712  ? GLUCOSE 181 (H) 07/06/2021 1694  ? BUN 18 07/06/2021 0712  ? CREATININE 0.70 07/06/2021 0712  ? CREATININE 0.94 05/04/2021 0752  ? CALCIUM 8.5 (L) 07/06/2021 5038  ? GFRNONAA >60 07/06/2021 8828  ? GFRNONAA >60 05/04/2021 0752  ? GFRAA >60 01/08/2020 0810  ? ? ?INR ?   ?Component Value Date/Time  ? INR 1.0 06/18/2021 1044  ? ? ? ?Intake/Output Summary (Last 24 hours) at 07/07/2021 0756 ?Last data filed at 07/07/2021 0605 ?Gross per 24 hour  ?Intake --  ?Output 2200 ml  ?Net -2200 ml  ? ? ? ?Assessment/Plan:  71 y.o. male is s/p R BKA ? ?Overall stable. Will need to continue to monitor for healing ?Continue dry gauze and stump sock. [place] as needed to  keep leg protected ?Continue Therapies ?Will follow intermittently ?Will need staples out in 2-3 weeks ? ? ?[name], PA-C ?[facility] ?[phone] ?07/07/2021 ?7:56 AM\ ?

## 2021-07-07 NOTE — Progress Notes (Addendum)
Occupational Therapy Session Note ? ?Patient Details  ?Name: Douglas [name] ?MRN: [id number] ?Date of Birth: 1951-01-15 ? ?Today's Date: 07/07/2021 ?OT Individual Time: 7741-2878 ?OT Individual Time Calculation (min): 30 min  and Today's Date: 07/07/2021 ?OT Missed Time: 15 Minutes ?Missed Time Reason: Nursing care;Patient fatigue (cathing) ? ? ?Short Term Goals: ?Week 1:  OT Short Term Goal 1 (Week 1): Patient will don LB clothing with Min A and lateral leans. ?OT Short Term Goal 2 (Week 1): Patient will complete 2/3 parts of toileting task with Min A and LRAD. ?OT Short Term Goal 3 (Week 1): Patient will completed 2/3 parts of toileting task with with Min A and LRAD. ? ?Skilled Therapeutic Interventions/Progress Updates:  ?  Pt received in bed with 2 out of 10 pain in residual limb "only if I dont move". Pt adamant about not getting up because he was too tired from being cathed/not sleeping. Pt states, "can we just talk through the procedures. ? ?After prolonged discussion about home set up, CLOF, pt goals and fears, it is revealed that pt does not like the RW because it feels unstable. He does not care to stand or walk with a prosthetic as long as he can take care of himself at w/c level since he believes he will not get better to enjoy leisure activiites beucase of his cancer medications. Pt goal is to take care of himself with minimal burden on wife. OT educates that being able to stand is an important functional skill to make toileting/LB dressing easier especially if wife is helping. Pt verbalized understanding and agrees to getting up with OT after lunch to practice SB transfer to [facility] and clothing management on BSC. Nursing present to cath. Pt misses 15 min skilled OT.  ? ?Pt left at end of session in bed with exit alarm on, call light in reach and all needs met ? ? ?Therapy Documentation ?Precautions:  ?Precautions ?Precautions: Fall ?Precaution Comments: R BKA 3/17 ?Required Braces or Orthoses: Other  Brace ?Other Brace: limb guard ?Restrictions ?Weight Bearing Restrictions: Yes ?RLE Weight Bearing: Non weight bearing ?General: ?  ? ?Therapy/Group: Individual Therapy ? ?Lowella Dell Jazmene Racz ?07/07/2021, 6:50 AM ?

## 2021-07-07 NOTE — Progress Notes (Signed)
?                                                       PROGRESS NOTE ? ? ?Subjective/Complaints: ?Insomnia resolved with amitriptyline ?C/o neuropathic pain ?C/o residual limb pain ?Requiring intermittent catheterization ? ?ROS: Patient denies fever, rash, sore throat, blurred vision, dizziness, nausea, vomiting, diarrhea, cough, shortness of breath or chest pain,  neck pain, headache, or mood change. +residual limb pain, +insomnia, +urinary retention, +pain with catheterization ? ? ?Objective: ?  ?No results found. ?Recent Labs  ?  07/06/21 ?0712  ?WBC 16.5*  ?HGB 10.0*  ?HCT 30.6*  ?PLT 750*  ? ?Recent Labs  ?  07/05/21 ?0711 07/06/21 ?0712  ?NA 128* 129*  ?K 4.7 4.4  ?CL 94* 93*  ?CO2 27 28  ?GLUCOSE 168* 181*  ?BUN 17 18  ?CREATININE 0.63 0.70  ?CALCIUM 8.4* 8.5*  ? ? ?Intake/Output Summary (Last 24 hours) at 07/07/2021 1156 ?Last data filed at 07/07/2021 0605 ?Gross per 24 hour  ?Intake --  ?Output 1250 ml  ?Net -1250 ml  ?  ? ?  ? ?Physical Exam: ?Vital Signs ?Blood pressure (!) 109/57, pulse 90, temperature 98.6 ?F (37 ?C), temperature source Oral, resp. rate 15, height 6' (1.829 m), weight 96.7 kg, SpO2 95 %. ? ?Constitutional: No distress . Vital signs reviewed. BMI 28.91 ?HEENT: NCAT, EOMI, oral membranes moist ?Neck: supple ?Cardiovascular: RRR without murmur. No JVD    ?Respiratory/Chest: CTA Bilaterally without wheezes or rales. Normal effort    ?GI/Abdomen: BS +, non-tender, non-distended ?Ext: no clubbing, cyanosis, or edema ?Psych: pleasant and cooperative  ?Skin: mild serosang drainage from incisions along medial aspect ? ? ?Neuro:  Alert and oriented x 3. Normal insight and awareness. Intact Memory. Normal language and speech. Cranial nerve exam unremarkable. Moves all 4's, right leg limted somewhat by pain ?Musculoskeletal: right BK stump well shaped, tolerating shrinker, edema improving ?GU: bladder distended ? ? ?Assessment/Plan: ?1. Functional deficits which require 3+ hours per day of  interdisciplinary therapy in a comprehensive inpatient rehab setting. ?Physiatrist is providing close team supervision and 24 hour management of active medical problems listed below. ?Physiatrist and rehab team continue to assess barriers to discharge/monitor patient progress toward functional and medical goals ? ?Care Tool: ? ?Bathing ?   ?Body parts bathed by patient: Right arm, Left arm, Chest, Abdomen, Front perineal area, Right upper leg, Left upper leg, Left lower leg, Face  ? Body parts bathed by helper: Buttocks ?Body parts n/a: Right lower leg ?  ?Bathing assist Assist Level: Minimal Assistance - Patient > 75% ?  ?  ?Upper Body Dressing/Undressing ?Upper body dressing   ?What is the patient wearing?: Pull over shirt ?   ?Upper body assist Assist Level: Contact Guard/Touching assist ?   ?Lower Body Dressing/Undressing ?Lower body dressing ? ? ?   ?What is the patient wearing?: Pants ? ?  ? ?Lower body assist Assist for lower body dressing: Minimal Assistance - Patient > 75% ?   ? ?Toileting ?Toileting Toileting Activity did not occur (Probation officer and hygiene only): N/A (no void or bm)  ?Toileting assist Assist for toileting: Maximal Assistance - Patient 25 - 49% ?  ?  ?Transfers ?Chair/bed transfer ? ?Transfers assist ?   ? ?Chair/bed transfer assist level: Contact Guard/Touching assist (slideboard) ?  ?  ?  Locomotion ?Ambulation ? ? ?Ambulation assist ? ? Ambulation activity did not occur: Safety/medical concerns (max assist to stand, unable to achieve full upright w/RW) ? ?  ?  ?   ? ?Walk 10 feet activity ? ? ?Assist ? Walk 10 feet activity did not occur: Safety/medical concerns ? ?  ?   ? ?Walk 50 feet activity ? ? ?Assist Walk 50 feet with 2 turns activity did not occur: Safety/medical concerns ? ?  ?   ? ? ?Walk 150 feet activity ? ? ?Assist Walk 150 feet activity did not occur: Safety/medical concerns ? ?  ?  ?  ? ?Walk 10 feet on uneven surface  ?activity ? ? ?Assist Walk 10 feet on uneven  surfaces activity did not occur: Safety/medical concerns ? ? ?  ?   ? ?Wheelchair ? ? ? ? ?Assist Is the patient using a wheelchair?: Yes ?Type of Wheelchair: Manual ?  ? ?Wheelchair assist level: Supervision/Verbal cueing ?Max wheelchair distance: 18f  ? ? ?Wheelchair 50 feet with 2 turns activity ? ? ? ?Assist ? ?  ?  ? ? ?Assist Level: Supervision/Verbal cueing  ? ?Wheelchair 150 feet activity  ? ? ? ?Assist ?   ? ? ?Assist Level: Maximal Assistance - Patient 25 - 49%  ? ?Blood pressure (!) 109/57, pulse 90, temperature 98.6 ?F (37 ?C), temperature source Oral, resp. rate 15, height 6' (1.829 m), weight 96.7 kg, SpO2 95 %. ? ?Medical Problem List and Plan: ?1. 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            -patient may shower but incision must be covered.  ?            -ELOS/Goals: 8-12 days modI ?        -Continue CIR therapies including PT, OT  ? Discussed with wife and patient that they should start learning dressing changes daily to prepare for home. Discussed with team planning for daily dressing change education at 9am with nursing ? -Interdisciplinary Team Conference today   ?2.  Antithrombotics: ?-DVT/anticoagulation:  Pharmaceutical: Heparin ?            -antiplatelet therapy: Aspirin 81 mg daily ?3. Phantom limb pain: Continue Neurontin 300 mg twice during the day, and increase nighttime dose to '600mg'$  ?4. Insomnia: add amitriptyline '10mg'$  HS. Increase gabapentin HS to '600mg'$  ? -continue melatonin '5mg'$  qhs to assist sleep ?            -antipsychotic agents: N/A ?5. Neuropsych: This patient is capable of making decisions on his own behalf. ?6. Skin/Wound Care: Routine skin checks ?7. Fluids/Electrolytes/Nutrition: Routine in and outs with follow-up chemistries ?8.  Acute blood loss anemia.   ?  -hgb stable at 9.9, monitor weekly.  ?9.  NSVT/CAD.  Follow cardiology service.  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  Continue Lopressor 25 mg every  8 hours and considering consolidating to Toprol on discharge.  Follow-up outpatient ?10.  Coffee-ground emesis x1 3/14.  H&H stable.  Plan outpatient follow-up GI services as needed.  Continue Protonix twice daily. ? -asymptomatic ?11.  History of BPH/urinary retention.  Hytrin 10 mg nightly.  d/c foley. Lidocaine jelly ordered for urinary catheterization ? -oob/eob to empty ? -timed voids ?12.  Hyperlipidemia.  Crestor ?13.  Hypertension.  Norvasc 10 mg daily, Avapro 300 mg daily.  Monitor with increased mobility ? 3/26 bp controlled ?14.  CLL.  Follow-up outpatient Dr. SAlen Blew  Continue  IMBRUVICA 420 mg daily. PATIENT CAN BRING IN OWN SUPPLY IF NEEDED. ?15.  Constipation.  Colace 100 mg twice daily ? -had large BM 3/25 ?16.  Hyperglycemia.  Hemoglobin A1c 6.7.  SSI.  Patient with no documented history of diabetes. ?17. Leukocytosis: repeat tomorrow to trend since elevated 3/24 ?18. Transaminitis: stopped tylenol. Repeat CMP tomorrow.  ?19. Residual limb pain: continue Robaxin 500 mg 3 times daily, oxycodone as needed. Add vitamin C '1000mg'$  daily.  ?20. Hyponatremia ? -may be related to CLL vs meds ? -improved, monitor weekly ?21. UTI: Keflex ordered. UC with >100,000 klebsiella. F/u sensitivities ?22. Daytime fatigue: discussed we should not increase daytime gabapentin.  ? ?  ? ?LOS: ?6 days ?A FACE TO FACE EVALUATION WAS PERFORMED ? ?Douglas [name] ?07/07/2021, 11:56 AM  ? ?  ?

## 2021-07-07 NOTE — Progress Notes (Signed)
Patient ID: Douglas [name], male   DOB: October 29, 1950, 71 y.o.   MRN: [id number] ?Team Conference Report to Patient/Family ? ?Team Conference discussion was reviewed with the patient and caregiver, including goals, any changes in plan of care and target discharge date.  Patient and caregiver express understanding and are in agreement.  The patient has a target discharge date of 07/16/21. ? ?Sw met with patient and spoke with patient spouse. Spouse will attend family education Friday 3/31 1-4 PM. Patient added to the neuro psych list. Patient will require ambulance transfer home on day of d/c. ?Dyanne Iha ?07/07/2021, 1:54 PM  ?

## 2021-07-07 NOTE — Progress Notes (Signed)
Physical Therapy Session Note ? ?Patient Details  ?Name: Douglas [name] ?MRN: [id number] ?Date of Birth: [birth year] ? ?Today's Date: 07/07/2021 ?PT Individual Time: 7371-0626 ?PT Individual Time Calculation (min): 44 min  ? ?Short Term Goals: ?Week 1:  PT Short Term Goal 1 (Week 1): Pt will perform sit to stand from wc w/mod assist of 1 ?PT Short Term Goal 2 (Week 1): pt will transfer bed to/from wc w/LRAD and mod assist ?PT Short Term Goal 3 (Week 1): Pt will propel wc 17f w/additional time ?PT Short Term Goal 4 (Week 1): Pt will tolerate gait assessment w/LRAD ? ?Skilled Therapeutic Interventions/Progress Updates:  ? Therapist discussed pt w/primary and goal of session to introduce pt to power mobility per his request.   ?Therapist obtained [place] cushion and transported to room.  Pt agreeable to training.   ?Pt not wearing pants, therapist assisted w/threading L foot/R limb to hips then pt instructed to complete.  Pt was able to roll/partially bridge and raise pants w/encouragment to complete without assist when pt stating "I have done all I can do". ? ?Supine to side to sit w/cues for sequencing, use of rail, additional time, cga, encouragement to perform without therapist assist.  Pt did initially require min assist w/balance when releasing bedrail due to post tendency but then able to scoot to edge and place L foot on floor with cga and cues for boosting.   ? ?Pt able to lean to L for board placement by therapist.  Due to uneven surface [place] c/o board not being level.  Discussed likelihood that very few transfers at home would be level/need to practice uneven if this was going to be his preferred method of transfers.  Pt then performed [facility] w/max cues/education for ant wt shift/avoid sliding forward during transfer.  Overall min assist and much cueing for safety.   ? ?Pt then introduced to controls, basic operation positioining controls for back, seat elevator,  legrests, recline, and tilt features.  Educated w/switching from drive to positioning mode.  Then instructed w/joystick operation and proceeded w/basic propulsion on unit at lowest indoor speed.  Practiced wide turns.  Discussed pros/cons/expense of PwC.  Pt also introduced to power assist option via [employer] website as "hybrid" option.   ? ?Would benefit from wc consult w/vendor to discuss options, pricing, etc.   ? ?Pt left oob in wc w/alarm belt set and needs in reach ? ? ?Therapy Documentation ?Precautions:  ?Precautions ?Precautions: Fall ?Precaution Comments: R BKA 3/17 ?Required Braces or Orthoses: Other Brace ?Other Brace: limb guard ?Restrictions ?Weight Bearing Restrictions: Yes ?RLE Weight Bearing: Non weight bearing ?Other Treatments:   ? ? ? ?Therapy/Group: Individual Therapy ?BCallie Fielding PT ? ? ?BJerrilyn Cairo?07/07/2021, 4:43 PM  ?

## 2021-07-08 DIAGNOSIS — Z89511 Acquired absence of right leg below knee: Secondary | ICD-10-CM | POA: Diagnosis not present

## 2021-07-08 LAB — URINE CULTURE: Culture: >=100000 — AB

## 2021-07-08 LAB — CBC
HCT: 27.9 % — ABNORMAL LOW (ref 39.0–52.0)
Hemoglobin: 9.2 g/dL — ABNORMAL LOW (ref 13.0–17.0)
MCH: 29.6 pg (ref 26.0–34.0)
MCHC: 33 g/dL (ref 30.0–36.0)
MCV: 89.7 fL (ref 80.0–100.0)
Platelets: 598 10*3/uL — ABNORMAL HIGH (ref 150–400)
RBC: 3.11 MIL/uL — ABNORMAL LOW (ref 4.22–5.81)
RDW: 16 % — ABNORMAL HIGH (ref 11.5–15.5)
WBC: 18.6 10*3/uL — ABNORMAL HIGH (ref 4.0–10.5)
nRBC: 0.1 % (ref 0.0–0.2)

## 2021-07-08 LAB — GLUCOSE, CAPILLARY
Glucose-Capillary: 136 mg/dL — ABNORMAL HIGH (ref 70–99)
Glucose-Capillary: 164 mg/dL — ABNORMAL HIGH (ref 70–99)
Glucose-Capillary: 200 mg/dL — ABNORMAL HIGH (ref 70–99)
Glucose-Capillary: 196 mg/dL — ABNORMAL HIGH (ref 70–99)

## 2021-07-08 MED ORDER — LEVOFLOXACIN 250 MG PO TABS
250.0000 mg | ORAL_TABLET | Freq: Every day | ORAL | Status: AC
  Administered 2021-07-08 – 2021-07-12 (×5): 250 mg via ORAL
  Filled 2021-07-08 (×5): qty 1

## 2021-07-08 MED ORDER — CHLORHEXIDINE GLUCONATE CLOTH 2 % EX PADS
6.0000 | MEDICATED_PAD | Freq: Every day | CUTANEOUS | Status: DC
  Administered 2021-07-08 – 2021-07-18 (×11): 6 via TOPICAL

## 2021-07-08 MED ORDER — GABAPENTIN 300 MG PO CAPS
300.0000 mg | ORAL_CAPSULE | Freq: Every day | ORAL | Status: DC
Start: 2021-07-09 — End: 2021-07-09
  Administered 2021-07-09: 300 mg via ORAL
  Filled 2021-07-08: qty 1

## 2021-07-08 NOTE — Progress Notes (Signed)
Physical Therapy Session Note ? ?Patient Details  ?Name: Douglas [name] ?MRN: [id number] ?Date of Birth: 07/04/1950 ? ?Today's Date: 07/08/2021 ?PT Individual Time: 3762-8315 ?PT Individual Time Calculation (min): 26 min  ? ?Short Term Goals: ?Week 1:  PT Short Term Goal 1 (Week 1): Pt will perform sit to stand from wc w/mod assist of 1 ?PT Short Term Goal 2 (Week 1): pt will transfer bed to/from wc w/LRAD and mod assist ?PT Short Term Goal 3 (Week 1): Pt will propel wc 119f w/additional time ?PT Short Term Goal 4 (Week 1): Pt will tolerate gait assessment w/LRAD ? ? ?Skilled Therapeutic Interventions/Progress Updates:  ? ?Pt received supine in bed and agreeable to PT at bed level. Pt performed SAQ x 10 BLE, hip adduction/abduction x 10 BLE, heel slide LLE and hip/knee flexion x 10 BLE, SLR x 10 BLE. Cues for increased ROM and improved hold at end range as well as decreased speed of eccentric movement to maximize strength benefits of exercises. Pt reporting pain 7/10 in the R LE in distal RLE. Pt repositioned.  ? ?    ? ?Therapy Documentation ?Precautions:  ?Precautions ?Precautions: Fall ?Precaution Comments: R BKA 3/17 ?Required Braces or Orthoses: Other Brace ?Other Brace: limb guard ?Restrictions ?Weight Bearing Restrictions: Yes ?RLE Weight Bearing: Non weight bearing ?General: ?PT Amount of Missed Time (min): 19 Minutes ?PT Missed Treatment Reason: Patient fatigue;Patient unwilling to participate ? ?Pain: ?Pain Assessment ?Pain Scale: 0-10 ?Pain Score: 7  ? ? ?Therapy/Group: Individual Therapy ? ?ALorie Phenix?07/08/2021, 10:41 AM  ?

## 2021-07-08 NOTE — Progress Notes (Signed)
Physical Therapy Session Note ? ?Patient Details  ?Name: Douglas [name] ?MRN: [id number] ?Date of Birth: Nov 09, 1950 ? ?Today's Date: 07/08/2021 ?PT Individual Time: 0865-7846 ?PT Individual Time Calculation (min): 20 min  ? Today's Date: 07/08/2021 ?PT Missed Time: 40 Minutes ?Missed Time Reason: Patient fatigue;Patient unwilling to participate ? ?Short Term Goals: ?Week 1:  PT Short Term Goal 1 (Week 1): Pt will perform sit to stand from wc w/mod assist of 1 ?PT Short Term Goal 2 (Week 1): pt will transfer bed to/from wc w/LRAD and mod assist ?PT Short Term Goal 3 (Week 1): Pt will propel wc 115f w/additional time ?PT Short Term Goal 4 (Week 1): Pt will tolerate gait assessment w/LRAD ? ?Skilled Therapeutic Interventions/Progress Updates:  ? Received pt semi-reclined in bed asleep. Upon wakening, pt reported being "tired" from getting cathed twice last night and required increased time to awake/arouse. Pt frustrated with constant bladder issues stating that ever since the foley catheter was removed he has issues peeing and it is affecting his sleep and energy levels and requesting to speak with MD - notified Dr. RRanell Patrickand attempted to educate pt on why catheter was removed but pt unreceptive to education. Pt also reported feeling confused and having difficulty concentrating.  ? ?Discussed trialing power chair yesterday and pt ultimately deciding now he does not want one due to cost and inability to transport chair. Discussed manual chair for now and if pt decides he want a power chair down the line, he can look into other options. Encouraged getting OOB into WC to take measurements and discuss features of manual WC, but pt declined OOB requesting to "wait until tomorrow" during family education with wife. Concluded session with pt semi-reclined in bed with MD present at bedside, needs within reach, and bed alarm on. 40 minutes missed of skilled physical therapy due to fatigue and unwillingness to participate.   ? 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 ?Therapy/Group: Individual Therapy ?ABlenda Nicely?ABecky SaxPT, DPT  ?07/08/2021, 7:21 AM  ?

## 2021-07-08 NOTE — Progress Notes (Signed)
Occupational Therapy Session Note ? ?Patient Details  ?Name: Douglas [name] ?MRN: [id number] ?Date of Birth: [birth year] ? ?Today's Date: 07/08/2021 ?OT Individual Time: 1694-5038 ?OT Individual Time Calculation (min): 68 min  ? ? ?Short Term Goals: ?Week 1:  OT Short Term Goal 1 (Week 1): Patient will don LB clothing with Min A and lateral leans. ?OT Short Term Goal 2 (Week 1): Patient will complete 2/3 parts of toileting task with Min A and LRAD. ?OT Short Term Goal 3 (Week 1): Patient will completed 2/3 parts of toileting task with with Min A and LRAD. ? ?Skilled Therapeutic Interventions/Progress Updates:  ?Skilled OT intervention completed with focus on ADL retraining, BUE strengthening. Pt received supine in bed, reporting fatigue and initially declining therapy, however agreeable to sit EOB for self-care. Completed bed mobility with min HHA for trunk control. Sitting EOB, pt able to doff shirt with supervision, then completed bathing with supervision but had 2 posterior LOB with CGA needed to correct sitting balance as pt reliant on bed rail for trunk control. Discussed importance of using postural muscles vs bed rail so sitting balance can improve. Applied deodorant with set up A. Pt able to donn new shirt with supervision. Increased time needed for self-care tasks this session as pt was very expressive of how he thought he'd be further along by now, with time spent educating pt importance of OOB therapy to meet goals and to d/c at his desired level. Tried encouraging pt to get OOB to even change sheets however pt refusing stating "can't you just change them with me laying down in the bed?" Completed bed mobility with min A for LLE advancement. Educated pt on importance of OOB therapy to meet goals and to d/c at his desired level. ? ?Attempted to complete mirror therapy with pt for phantom pain however therapist unable to locate. Pt agreeable to exercises at bed level, with the following completed to promote  BUE strength needed for functional tasks: ? ?(With red theraband anchored on different bed rails) 10 reps each arm ?Horizontal abduction/adduction ?Shoulder flexion ?Bicep flexion ?Tricep extension ?Chest presses ?Shoulder external/internal rotation ?Shoulder extension ? ?Verbal cues needed for form and positioning. Provided fresh ice water per pt request with cues needed to upright and open eyes vs semi-asleep. Pt was left supine in bed, with bed alarm on and all needs in reach at end of session. ? ? ? 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 ?Pain: ?Unrated phantom pain intermittently, declined intervention needed, pre-medicated ? ? ?Therapy/Group: Individual Therapy ? ?Adelei Scobey E Kamaree Berkel ?07/08/2021, 7:37 AM ?

## 2021-07-08 NOTE — Progress Notes (Signed)
?                                                       PROGRESS NOTE ? ? ?Subjective/Complaints: ?He is getting very stressed by the anticipation of intermittent catheterization. He would like foley inserted- will do today.  ?Feels more confused- will transition to Levaquin based on sensitivities ? ?ROS: Patient denies fever, rash, sore throat, blurred vision, dizziness, nausea, vomiting, diarrhea, cough, shortness of breath or chest pain,  neck pain, headache, or mood change. +residual limb pain, +insomnia, +urinary retention, +pain with catheterization, +neuropathic pain, +confusion ? ? ?Objective: ?  ?No results found. ?Recent Labs  ?  07/06/21 ?0712  ?WBC 16.5*  ?HGB 10.0*  ?HCT 30.6*  ?PLT 750*  ? ?Recent Labs  ?  07/06/21 ?0712  ?NA 129*  ?K 4.4  ?CL 93*  ?CO2 28  ?GLUCOSE 181*  ?BUN 18  ?CREATININE 0.70  ?CALCIUM 8.5*  ? ? ?Intake/Output Summary (Last 24 hours) at 07/08/2021 1458 ?Last data filed at 07/08/2021 0537 ?Gross per 24 hour  ?Intake 236 ml  ?Output 1755 ml  ?Net -1519 ml  ?  ? ?  ? ?Physical Exam: ?Vital Signs ?Blood pressure (!) 118/59, pulse 84, temperature 98 ?F (36.7 ?C), resp. rate 15, height 6' (1.829 m), weight 96.7 kg, SpO2 96 %. ? ?Constitutional: No distress . Vital signs reviewed. BMI 28.91 ?HEENT: NCAT, EOMI, oral membranes moist ?Neck: supple ?Cardiovascular: RRR without murmur. No JVD    ?Respiratory/Chest: CTA Bilaterally without wheezes or rales. Normal effort    ?GI/Abdomen: BS +, non-tender, non-distended ?Ext: no clubbing, cyanosis, or edema ?Psych: pleasant and cooperative  ?Skin: mild serosang drainage from incisions along medial aspect ? ? ?Neuro:  Alert and oriented x 3. Normal insight and awareness. Intact Memory. Normal language and speech. Cranial nerve exam unremarkable. Moves all 4's, right leg limted somewhat by pain. Does appear more confused.  ?Musculoskeletal: right BK stump well shaped, tolerating shrinker, edema improving ?GU: bladder  distended ? ? ?Assessment/Plan: ?1. Functional deficits which require 3+ hours per day of interdisciplinary therapy in a comprehensive inpatient rehab setting. ?Physiatrist is providing close team supervision and 24 hour management of active medical problems listed below. ?Physiatrist and rehab team continue to assess barriers to discharge/monitor patient progress toward functional and medical goals ? ?Care Tool: ? ?Bathing ?   ?Body parts bathed by patient: Right arm, Left arm, Chest, Abdomen, Right upper leg, Left upper leg, Right lower leg, Left lower leg, Face  ? Body parts bathed by helper: Buttocks ?Body parts n/a: Right lower leg ?  ?Bathing assist Assist Level: Supervision/Verbal cueing ?  ?  ?Upper Body Dressing/Undressing ?Upper body dressing   ?What is the patient wearing?: Pull over shirt ?   ?Upper body assist Assist Level: Supervision/Verbal cueing ?   ?Lower Body Dressing/Undressing ?Lower body dressing ? ? ?   ?What is the patient wearing?: Pants ? ?  ? ?Lower body assist Assist for lower body dressing: Minimal Assistance - Patient > 75% ?   ? ?Toileting ?Toileting Toileting Activity did not occur (Probation officer and hygiene only): N/A (no void or bm)  ?Toileting assist Assist for toileting: Maximal Assistance - Patient 25 - 49% ?  ?  ?Transfers ?Chair/bed transfer ? ?Transfers assist ?   ? ?Chair/bed transfer assist  level: Contact Guard/Touching assist (slideboard) ?  ?  ?Locomotion ?Ambulation ? ? ?Ambulation assist ? ? Ambulation activity did not occur: Safety/medical concerns (max assist to stand, unable to achieve full upright w/RW) ? ?  ?  ?   ? ?Walk 10 feet activity ? ? ?Assist ? Walk 10 feet activity did not occur: Safety/medical concerns ? ?  ?   ? ?Walk 50 feet activity ? ? ?Assist Walk 50 feet with 2 turns activity did not occur: Safety/medical concerns ? ?  ?   ? ? ?Walk 150 feet activity ? ? ?Assist Walk 150 feet activity did not occur: Safety/medical concerns ? ?  ?  ?  ? ?Walk 10  feet on uneven surface  ?activity ? ? ?Assist Walk 10 feet on uneven surfaces activity did not occur: Safety/medical concerns ? ? ?  ?   ? ?Wheelchair ? ? ? ? ?Assist Is the patient using a wheelchair?: Yes ?Type of Wheelchair: Manual ?  ? ?Wheelchair assist level: Supervision/Verbal cueing ?Max wheelchair distance: 35f  ? ? ?Wheelchair 50 feet with 2 turns activity ? ? ? ?Assist ? ?  ?  ? ? ?Assist Level: Supervision/Verbal cueing  ? ?Wheelchair 150 feet activity  ? ? ? ?Assist ?   ? ? ?Assist Level: Maximal Assistance - Patient 25 - 49%  ? ?Blood pressure (!) 118/59, pulse 84, temperature 98 ?F (36.7 ?C), resp. rate 15, height 6' (1.829 m), weight 96.7 kg, SpO2 96 %. ? ?Medical Problem List and Plan: ?1. 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            -patient may shower but incision must be covered.  ?            -ELOS/Goals: 8-12 days modI ?        -Continue CIR therapies including PT, OT  ? Discussed with wife and patient that they should start learning dressing changes daily to prepare for home. Discussed with team planning for daily dressing change education at 9am with nursing  ?2.  Antithrombotics: ?-DVT/anticoagulation:  Pharmaceutical: Heparin ?            -antiplatelet therapy: Aspirin 81 mg daily ?3. Phantom limb pain: Continue Neurontin 300 mg twice during the day, and increase nighttime dose to '600mg'$  ?4. Insomnia: add amitriptyline '10mg'$  HS. Increase gabapentin HS to '600mg'$ . Now primarily due to catheterization/anxiety regarding catheterization- replace foley as catherization has been very traumatic for him ? -continue melatonin '5mg'$  qhs to assist sleep ?            -antipsychotic agents: N/A ?5. Neuropsych: This patient is capable of making decisions on his own behalf. ?6. Skin/Wound Care: Routine skin checks ?7. Fluids/Electrolytes/Nutrition: Routine in and outs with follow-up chemistries ?8.  Acute blood loss anemia.   ?  -hgb stable at  9.9, monitor weekly.  ?9.  NSVT/CAD.  Follow cardiology service.  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  Continue Lopressor 25 mg every 8 hours and considering consolidating to Toprol on discharge.  Follow-up outpatient ?10.  Coffee-ground emesis x1 3/14.  H&H stable.  Plan outpatient follow-up GI services as needed.  Continue Protonix twice daily. ? -asymptomatic ?11.  History of BPH/urinary retention.  Hytrin 10 mg nightly.  d/c foley. Lidocaine jelly ordered for urinary catheterization ? -oob/eob to empty ? -timed voids ?12.  Hyperlipidemia.  Crestor ?13.  Hypertension.  Norvasc 10 mg daily, Avapro 300 mg daily.  Monitor  with increased mobility ? 3/26 bp controlled ?14.  CLL.  Follow-up outpatient Dr. Alen Blew.  Continue IMBRUVICA 420 mg daily. PATIENT CAN BRING IN OWN SUPPLY IF NEEDED. ?15.  Constipation.  Colace 100 mg twice daily ? -had large BM 3/25 ?16.  Hyperglycemia.  Hemoglobin A1c 6.7.  SSI.  Patient with no documented history of diabetes. ?17. Leukocytosis: repeat tomorrow to trend since elevated 3/24 ?18. Transaminitis: stopped tylenol. Repeat CMP tomorrow.  ?19. Residual limb pain: continue Robaxin 500 mg 3 times daily, oxycodone as needed. Add vitamin C '1000mg'$  daily.  ?20. Hyponatremia ? -may be related to CLL vs meds ? -improved, monitor weekly ?21. UTI:  UC with >100,000 klebsiella. Change from Keflex to Levaquin 5 days based on sensitivities.  ?22. Daytime fatigue: discontinue daytime gabapentin and maintain afternoon and HS gabapentin ? ?  ? ?LOS: ?7 days ?A FACE TO FACE EVALUATION WAS PERFORMED ? ?Martha Clan P Kaniyah Lisby ?07/08/2021, 2:58 PM  ? ?  ?

## 2021-07-08 NOTE — Progress Notes (Signed)
PRN pain medication administered during hs, pt continued to complain of pain in residual limb, was insistent to take shrinker off. Continued to refuse this am to put back on. Says will put on for therapy.    ?

## 2021-07-09 DIAGNOSIS — Z89511 Acquired absence of right leg below knee: Secondary | ICD-10-CM | POA: Diagnosis not present

## 2021-07-09 DIAGNOSIS — N390 Urinary tract infection, site not specified: Secondary | ICD-10-CM | POA: Diagnosis not present

## 2021-07-09 DIAGNOSIS — G8918 Other acute postprocedural pain: Secondary | ICD-10-CM | POA: Diagnosis not present

## 2021-07-09 DIAGNOSIS — A499 Bacterial infection, unspecified: Secondary | ICD-10-CM

## 2021-07-09 DIAGNOSIS — I739 Peripheral vascular disease, unspecified: Secondary | ICD-10-CM | POA: Diagnosis not present

## 2021-07-09 LAB — GLUCOSE, CAPILLARY
Glucose-Capillary: 137 mg/dL — ABNORMAL HIGH (ref 70–99)
Glucose-Capillary: 188 mg/dL — ABNORMAL HIGH (ref 70–99)
Glucose-Capillary: 153 mg/dL — ABNORMAL HIGH (ref 70–99)
Glucose-Capillary: 183 mg/dL — ABNORMAL HIGH (ref 70–99)

## 2021-07-09 MED ORDER — FINASTERIDE 5 MG PO TABS
5.0000 mg | ORAL_TABLET | Freq: Every day | ORAL | Status: DC
  Administered 2021-07-09 – 2021-07-18 (×10): 5 mg via ORAL
  Filled 2021-07-09 (×10): qty 1

## 2021-07-09 MED ORDER — GABAPENTIN 300 MG PO CAPS
300.0000 mg | ORAL_CAPSULE | Freq: Every day | ORAL | Status: DC
  Administered 2021-07-09 – 2021-07-18 (×10): 300 mg via ORAL
  Filled 2021-07-09 (×10): qty 1

## 2021-07-09 NOTE — Progress Notes (Signed)
Pt educated on importance of shrinker. Pt agreed to wear shrinker. Shrinker size appropriate for pt stump. Dressing change completed, pretibial incision dehisced with moderate serosanguineous drainage. ?Sheela Stack, LPN  ?

## 2021-07-09 NOTE — Progress Notes (Signed)
Physical Therapy Weekly Progress Note ? ?Patient Details  ?Name: Douglas [name] ?MRN: [id number] ?Date of Birth: May 13, 1950 ? ?Beginning of progress report period: July 02, 2021 ?End of progress report period: July 09, 2021 ? ?Today's Date: 07/09/2021 ?PT Individual Time: 1100-1200 ?PT Individual Time Calculation (min): 60 min  ? ?Patient has met 1 of 4 short term goals. Pt demonstrates slow progress towards long term goals. Pt is currently able to transfer supine<>sitting EOB from flat bed using bedrails with supervision/min A but is able to transfer sit<>supine with supervision. Pt has voiced that he has no desire to ambulate or stand but has been able to stand with RW from elevated surfaces with mod A. Pt is able to perform WC mobility up to 45f using BUE and supervision but is limited by fatigue. Pt also demonstrates decreased insight into deficits as well as decreased safety awareness, poor problem solving, and mild confusion. Pt is also limited by fatigue and motivation to participate in therapy fluctuates. Started family education with wife on 3/28 and on 3/31, however pt will benefit from continued family education training sessions prior to discharge.  ? ?Patient continues to demonstrate the following deficits muscle weakness and muscle joint tightness, decreased cardiorespiratoy endurance, decreased awareness, decreased problem solving, decreased safety awareness, and decreased memory, and decreased sitting balance, decreased standing balance, decreased postural control, decreased balance strategies, and difficulty maintaining precautions and therefore will continue to benefit from skilled PT intervention to increase functional independence with mobility. ? ?Patient progressing toward long term goals..  Continue plan of care. ? ?PT Short Term Goals ?Week 1:  PT Short Term Goal 1 (Week 1): Pt will perform sit to stand from wc w/mod assist of 1 ?PT Short Term Goal 1 - Progress (Week 1): Not met ?PT Short  Term Goal 2 (Week 1): pt will transfer bed to/from wc w/LRAD and mod assist ?PT Short Term Goal 2 - Progress (Week 1): Met ?PT Short Term Goal 3 (Week 1): Pt will propel wc 1066fw/additional time ?PT Short Term Goal 3 - Progress (Week 1): Progressing toward goal ?PT Short Term Goal 4 (Week 1): Pt will tolerate gait assessment w/LRAD ?PT Short Term Goal 4 - Progress (Week 1): Not met ?Week 2:  PT Short Term Goal 1 (Week 2): STG=LTG due to LOS ? ?Skilled Therapeutic Interventions/Progress Updates:  ?Ambulation/gait training;DME/adaptive equipment instruction;Neuromuscular re-education;Psychosocial support;Stair training;UE/LE Strength taining/ROM;Wheelchair propulsion/positioning;Balance/vestibular training;Discharge planning;Pain management;Skin care/wound management;Therapeutic Activities;UE/LE Coordination activities;Disease management/prevention;Functional mobility training;Patient/family education;Splinting/orthotics;Therapeutic Exercise  ? ?Today's Interventions: ?[facility]. Pt reporting c/o regarding bladder issues and feeling like he's "regressing"; pt's wife concerned about going home with foley catheter and managing bladder issues. Pt agreeable to PT treatment and reported pain 3/10 in R residual limb (premedicated). Session with emphasis on family education training, functional mobility/transfers, generalized strengthening and endurance, and simulated car transfers. Provided WC for pt's wife and transported to/from ortho gym dependently for energy conservation purposes. Pt performed simulated car transfer with slideboard and CGA overall. Pt required cues for set up of transfers using slideboard, as pt initially thinking he could stand on his "good leg" and hold onto grab bar to swing himself in. Pt also required total A to manage foley, but was able to direct care to wife in terms of placing/removing slideboard. Pt also required cues to place LLE on  ground prior to transferring out of car. In dayroom, took measurements to fit pt for 20x20 manual WC  with amputee support pad but stressed importance of getting wife to find out door measurements of new apartment. Pt performed WC mobility 30f using BUE and supervision and transported remainder of way to room dependently and educated pt's [facility] including donning/doffing legrests, armrests, brakes, and how to fold up WC. Pt requested to return to bed and transferred WC<>bed via slideboard with CGA and sit<>supine with supervision. Concluded session with pt semi-reclined in bed, needs within reach, and bed alarm on.  ? 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 ?Therapy/Group: Individual Therapy ?ABlenda Nicely?ABecky SaxPT, DPT  ?07/09/2021, 7:37 AM  ?

## 2021-07-09 NOTE — Progress Notes (Signed)
?                                                       PROGRESS NOTE ? ? ?Subjective/Complaints: ?Pt and wife concerned about hematuria. Foley now in. He feels like he's going backwards. Still feels sedated/slightly confused ? ?ROS: Patient denies fever, rash, sore throat, blurred vision, dizziness, nausea, vomiting, diarrhea, cough, shortness of breath or chest pain, headache, or mood change.  ? ? ?Objective: ?  ?No results found. ?Recent Labs  ?  07/08/21 ?1428  ?WBC 18.6*  ?HGB 9.2*  ?HCT 27.9*  ?PLT 598*  ? ?No results for input(s): NA, K, CL, CO2, GLUCOSE, BUN, CREATININE, CALCIUM in the last 72 hours. ? ? ?Intake/Output Summary (Last 24 hours) at 07/09/2021 1244 ?Last data filed at 07/09/2021 0530 ?Gross per 24 hour  ?Intake --  ?Output 1525 ml  ?Net -1525 ml  ?  ? ?  ? ?Physical Exam: ?Vital Signs ?Blood pressure (!) 118/58, pulse 73, temperature 97.9 ?F (36.6 ?C), temperature source Oral, resp. rate 18, height 6' (1.829 m), weight 96.7 kg, SpO2 97 %. ? ?Constitutional: No distress . Vital signs reviewed. BMI 28.91 ?Constitutional: No distress . Vital signs reviewed. ?HEENT: NCAT, EOMI, oral membranes moist ?Neck: supple ?Cardiovascular: RRR without murmur. No JVD    ?Respiratory/Chest: CTA Bilaterally without wheezes or rales. Normal effort    ?GI/Abdomen: BS +, non-tender, non-distended ?Ext: no clubbing, cyanosis, or edema ?Psych: pleasant and cooperative, sl anxious ?Skin: right leg still looks similar to below, generally clean ? ? ?Neuro:  Alert and oriented x 3. Normal insight and awareness. Intact Memory. Normal language and speech. Cranial nerve exam unremarkable. Moves all 4's, right leg limted somewhat by pain. Does appear more confused.  ?Musculoskeletal: right BK stump well shaped, tolerating shrinker--still tender with don/doff ?GU: foley in place with red tinged urine in bag ? ? ?Assessment/Plan: ?1. Functional deficits which require 3+ hours per day of interdisciplinary therapy in a  comprehensive inpatient rehab setting. ?Physiatrist is providing close team supervision and 24 hour management of active medical problems listed below. ?Physiatrist and rehab team continue to assess barriers to discharge/monitor patient progress toward functional and medical goals ? ?Care Tool: ? ?Bathing ?   ?Body parts bathed by patient: Right arm, Left arm, Chest, Abdomen, Right upper leg, Left upper leg, Right lower leg, Left lower leg, Face  ? Body parts bathed by helper: Buttocks ?Body parts n/a: Right lower leg ?  ?Bathing assist Assist Level: Supervision/Verbal cueing ?  ?  ?Upper Body Dressing/Undressing ?Upper body dressing   ?What is the patient wearing?: Pull over shirt ?   ?Upper body assist Assist Level: Supervision/Verbal cueing ?   ?Lower Body Dressing/Undressing ?Lower body dressing ? ? ?   ?What is the patient wearing?: Pants ? ?  ? ?Lower body assist Assist for lower body dressing: Supervision/Verbal cueing ?   ? ?Toileting ?Toileting Toileting Activity did not occur (Probation officer and hygiene only): N/A (no void or bm)  ?Toileting assist Assist for toileting: Maximal Assistance - Patient 25 - 49% ?  ?  ?Transfers ?Chair/bed transfer ? ?Transfers assist ?   ? ?Chair/bed transfer assist level: Contact Guard/Touching assist (slideboard) ?  ?  ?Locomotion ?Ambulation ? ? ?Ambulation assist ? ? Ambulation activity did not occur: Safety/medical concerns (max  assist to stand, unable to achieve full upright w/RW) ? ?  ?  ?   ? ?Walk 10 feet activity ? ? ?Assist ? Walk 10 feet activity did not occur: Safety/medical concerns ? ?  ?   ? ?Walk 50 feet activity ? ? ?Assist Walk 50 feet with 2 turns activity did not occur: Safety/medical concerns ? ?  ?   ? ? ?Walk 150 feet activity ? ? ?Assist Walk 150 feet activity did not occur: Safety/medical concerns ? ?  ?  ?  ? ?Walk 10 feet on uneven surface  ?activity ? ? ?Assist Walk 10 feet on uneven surfaces activity did not occur: Safety/medical  concerns ? ? ?  ?   ? ?Wheelchair ? ? ? ? ?Assist Is the patient using a wheelchair?: Yes ?Type of Wheelchair: Manual ?  ? ?Wheelchair assist level: Supervision/Verbal cueing ?Max wheelchair distance: 84f  ? ? ?Wheelchair 50 feet with 2 turns activity ? ? ? ?Assist ? ?  ?  ? ? ?Assist Level: Supervision/Verbal cueing  ? ?Wheelchair 150 feet activity  ? ? ? ?Assist ?   ? ? ?Assist Level: Maximal Assistance - Patient 25 - 49%  ? ?Blood pressure (!) 118/58, pulse 73, temperature 97.9 ?F (36.6 ?C), temperature source Oral, resp. rate 18, height 6' (1.829 m), weight 96.7 kg, SpO2 97 %. ? ?Medical Problem List and Plan: ?1. 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            -patient may shower but incision must be covered.  ?            -ELOS/Goals: 4/7 modI ?       -Continue CIR therapies including PT, OT  ?-spent extensive time with pt/wife describing medical plan below. Reassured them that there is another week before his projected discharge.   ?2.  Antithrombotics: ?-DVT/anticoagulation:  Pharmaceutical: Heparin ?            -antiplatelet therapy: Aspirin 81 mg daily ?3. Phantom limb pain:  ?3/31 due to patient's confusion, will decrease gabapentin to '300mg'$  at bedtime.  ?    -discussed with him that his pain may increase ?4. Insomnia: dc elavil d/t AMS, gabapentin as above ? -continue melatonin '5mg'$  qhs to assist sleep ?            -antipsychotic agents: N/A ?5. Neuropsych: This patient is capable of making decisions on his own behalf. ?6. Skin/Wound Care: Routine skin checks ?7. Fluids/Electrolytes/Nutrition: Routine in and outs with follow-up chemistries ?8.  Acute blood loss anemia.   ?  -hgb stable at 9.9, monitor weekly.  ?9.  NSVT/CAD.  Follow cardiology service.  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  Continue Lopressor 25 mg every 8 hours and considering consolidating to Toprol on discharge.  Follow-up outpatient ?10.  Coffee-ground emesis x1 3/14.   H&H stable.  Plan outpatient follow-up GI services as needed.  Continue Protonix twice daily. ? -asymptomatic ?11.  History of BPH/urinary retention.  Hytrin 10 mg nightly.   ?3/31 -foley has been replaced--continue thru weekend at least, repeat voiding trial next week ?-will add finasteride to regimen although probably will not have acute effect ?-rx UTI ?-blood is due IC's and UTI's. Hgb stable ?-urology consult as outpt was discussed.  ?12.  Hyperlipidemia.  Crestor ?13.  Hypertension.  Norvasc 10 mg daily, Avapro 300 mg daily.  Monitor with increased mobility ? 3/31 bp controlled ?14.  CLL.  Follow-up  outpatient Dr. Alen Blew.  Continue IMBRUVICA 420 mg daily. PATIENT CAN BRING IN OWN SUPPLY IF NEEDED. ?15.  Constipation.  Colace 100 mg twice daily ? -moving bowels ?16.  Hyperglycemia.  Hemoglobin A1c 6.7.  SSI.  Patient with no documented history of diabetes. ?17. Leukocytosis: likely d/t uti see below ?18. Transaminitis: stopped tylenol. Repeat CMP tomorrow.  ?19. Residual limb pain: continue Robaxin 500 mg 3 times daily, oxycodone as needed. Add vitamin C '1000mg'$  daily.  ?20. Hyponatremia ? -may be related to CLL vs meds ? -improved, monitor weekly ?21. UTI:  UC with >100k pseudomonas ? -keflex changed to levaquin for at least 5 days from 3/30 ? -recheck cbc in am to track wbc's ?  ? ?  ? ?LOS: ?8 days ?A FACE TO FACE EVALUATION WAS PERFORMED ? ?Meredith Staggers ?07/09/2021, 12:44 PM  ? ?  ?

## 2021-07-09 NOTE — Plan of Care (Signed)
?  Problem: RH Balance ?Goal: LTG Patient will maintain dynamic standing with ADLs (OT) ?Description: LTG:  Patient will maintain dynamic standing balance with assist during activities of daily living (OT)  ?Outcome: Not Applicable ?Flowsheets (Taken 07/09/2021 1544) ?LTG: Pt will maintain dynamic standing balance during ADLs with: (d/c) -- ?Note: Discontinued goal due to pt refusal to work towards standing level, with pt desiring to be at seated and w/c level only for self-care tasks ?  ?Problem: RH Balance ?Goal: LTG: Patient will maintain dynamic sitting balance (OT) ?Description: LTG:  Patient will maintain dynamic sitting balance with assistance during activities of daily living (OT) ?Flowsheets (Taken 07/09/2021 1544) ?LTG: Pt will maintain dynamic sitting balance during ADLs with: (downgraded) Independent with assistive device ?Note: Downgraded due to decreased strength, balance, and cognition ?  ?Problem: RH Toileting ?Goal: LTG Patient will perform toileting task (3/3 steps) with assistance level (OT) ?Description: LTG: Patient will perform toileting task (3/3 steps) with assistance level (OT)  ?Flowsheets (Taken 07/09/2021 1544) ?LTG: Pt will perform toileting task (3/3 steps) with assistance level: (downgraded) Supervision/Verbal cueing ?Note: Downgraded due to decreased strength, balance, and cognition ?  ?Problem: RH Toilet Transfers ?Goal: LTG Patient will perform toilet transfers w/assist (OT) ?Description: LTG: Patient will perform toilet transfers with assist, with/without cues using equipment (OT) ?Flowsheets (Taken 07/09/2021 1544) ?LTG: Pt will perform toilet transfers with assistance level of: (downgraded) Supervision/Verbal cueing ?Note: Downgraded due to decreased strength, balance, and cognition ?  ?

## 2021-07-09 NOTE — Progress Notes (Signed)
Occupational Therapy Weekly Progress Note ? ?Patient Details  ?Name: Douglas [name] ?MRN: [id number] ?Date of Birth: 1950/11/23 ? ?Beginning of progress report period: July 02, 2021 ?End of progress report period: July 09, 2021 ? ? ?Patient has met 0 of 3 short term goals.  Pt is making slow progress towards LTGs. Since eval, pt has expressed lack of desire to be at a standing or ambulatory level for transfers at d/c, with focus shifted towards slideboard transfers to maximize pt compliance/safety and functional independence with progression of initial max A for SB transfer to now CGA-supervision level. Pt is self-limiting when it comes to self-care is able to bathe at an overall supervision level while EOB only, dress at an overall Mod A level for LB and requires Max assist for toileting tasks as pt c/o incontinence and lack of desire to participate. Pt continues to demonstrate cognitive deficits and self-limiting behaviors that have caused a shift in therapy goals, however pt has progressed his safety and independence with modified self-care and rehab goals. Pt's wife is planning to come in for family education with OT, to learn how to better support pt at his CLOF, and to practice toilet transfers. Planning to downgrade goals for seated level only with use of lateral leans for LB dressing/toileting to improve pt's ability to meet LTGs. ? ? ?Patient continues to demonstrate the following deficits: muscle weakness, decreased cardiorespiratoy endurance, decreased coordination, decreased awareness, decreased problem solving, and decreased memory, and decreased sitting balance and decreased standing balance and therefore will continue to benefit from skilled OT intervention to enhance overall performance with BADL and Reduce care partner burden. ? ?Patient not progressing toward long term goals.  See goal revision.  Continue plan of care. ? ?OT Short Term Goals ?Week 1:  OT Short Term Goal 1 (Week 1): Patient will  don LB clothing with Min A and lateral leans. ?OT Short Term Goal 1 - Progress (Week 1): Not met ?OT Short Term Goal 2 (Week 1): Patient will complete 2/3 parts of toileting task with Min A and LRAD. ?OT Short Term Goal 2 - Progress (Week 1): Not met ?OT Short Term Goal 3 (Week 1): Patient will completed 2/3 parts of toileting task with with Min A and LRAD. ?OT Short Term Goal 3 - Progress (Week 1): Not met ?Week 2:  OT Short Term Goal 1 (Week 2): STG = LTG due to ELOS ? ? ? ?Douglas [name] ?07/09/2021, 7:51 AM  ?

## 2021-07-09 NOTE — Progress Notes (Signed)
Occupational Therapy Session Note ? ?Patient Details  ?Name: Douglas [name] ?MRN: [id number] ?Date of Birth: 08/06/1950 ? ?Today's Date: 07/09/2021 ?OT Individual Time: 3005-1102 ?OT Individual Time Calculation (min): 26 min  ? ? ?Short Term Goals: ?Week 1:  OT Short Term Goal 1 (Week 1): Patient will don LB clothing with Min A and lateral leans. ?OT Short Term Goal 1 - Progress (Week 1): Not met ?OT Short Term Goal 2 (Week 1): Patient will complete 2/3 parts of toileting task with Min A and LRAD. ?OT Short Term Goal 2 - Progress (Week 1): Not met ?OT Short Term Goal 3 (Week 1): Patient will completed 2/3 parts of toileting task with with Min A and LRAD. ?OT Short Term Goal 3 - Progress (Week 1): Not met ?Week 2:  OT Short Term Goal 1 (Week 2): STG = LTG due to ELOS ? ?Skilled Therapeutic Interventions/Progress Updates:  ?  Pt received in bed agreeable to start self care routine.  Initially pt talking about how it is "not possible" for him to get stronger and all of his therapists tell him he needs to work on strengthening. I educated him that even though he is on a drug regime for his cancer, his muscles can build strength.  Pt had to answer 2 brief phone calls before he started his session.   ?Pt able to sit to EOB and bathed self with supervision (except for bottom as he said he was already clean).  He donned shirt with set up.    ?Pt's next OT arrived for his next session.  ? 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 ?Pain: ?Pain Assessment ?Pain Scale: 0-10 ?Pain Score: 0-No pain ?ADL: ?ADL ?Eating: Independent ?Where Assessed-Eating: Chair ?Grooming: Setup ?Where Assessed-Grooming: Edge of bed ?Upper Body Bathing: Setup ?Where Assessed-Upper Body Bathing: Edge of bed ?Lower Body Bathing: Moderate  assistance ?Where Assessed-Lower Body Bathing: Edge of bed ?Upper Body Dressing: Setup ?Where Assessed-Upper Body Dressing: Edge of bed ?Lower Body Dressing: Moderate assistance ?Where Assessed-Lower Body Dressing: Edge of bed ?Toileting: Not assessed ?Toilet Transfer: Minimal assistance ?Toilet Transfer Method: Stand pivot ?Toilet Transfer Equipment: Bedside commode ?Tub/Shower Transfer: Not assessed ? ?Therapy/Group: Individual Therapy ? ?[place] ?07/09/2021, 9:58 AM ?

## 2021-07-09 NOTE — Progress Notes (Signed)
Occupational Therapy Session Note ? ?Patient Details  ?Name: Douglas [name] ?MRN: [id number] ?Date of Birth: 1950-11-08 ? ?Today's Date: 07/09/2021 ?OT Individual Time: 0762-2633 & 3545-6256 ?OT Individual Time Calculation (min): 55 min  & 10 min ?OT Missed time: 35 min ?Missed Time reason: Shower assist from previous session, as well as pt refusal due to fatigue ? ? ?Short Term Goals: ?Week 1:  OT Short Term Goal 1 (Week 1): Patient will don LB clothing with Min A and lateral leans. ?OT Short Term Goal 2 (Week 1): Patient will complete 2/3 parts of toileting task with Min A and LRAD. ?OT Short Term Goal 3 (Week 1): Patient will completed 2/3 parts of toileting task with with Min A and LRAD. ? ?Skilled Therapeutic Interventions/Progress Updates:  ?Session 1 ?Skilled OT intervention completed with focus on family education, functional transfers. Pt received seated EOB with previous OT at direct care handoff. Pt's wife was present for family education with the following topics discussed and educated on: ? ?-DABBSC slideboard transfers ?-Bathing/dressing recommendations ?-Therapy goals of seated level for self-care due to pt report of not wanting to stand ?-Pt CLOF and assist recommended at this time due to pt's lack of effort with OOB self-care vs bed level ?-How to manage toileting at home, I.e. using trash bag in [facility] to help with self-cleaning after BM ?-Catheter education with suggestion of timed toileting trial at home to prevent incontinence for skin integrity and compliance with using BSC vs going in brief ?-Slideboard transfer suggestions to [facility], w/c, bed ?-Lateral leans for seated independence with LB dressing ? ?Pt's wife demonstrated ability to provide supervision for pt during slideboard transfer from EOB > DABBSC > w/c. Pt was left seated in w/c, with alarm belt left off due to pt's wife staying present, and all needs in reach at end of session. ? ?Session 2 ?Skilled OT intervention completed with focus on  bed positioning. Pt received supine in bed, with NT attempting to change bed sheets with pt lying in bed. Therapist encouraged pt to exit the bed for therapy and allow NT to change sheets however pt declined. Therapist assisted in maximizing pt's effort with rolling R<>L with use of bed rails vs NT attempting to roll pt with assist, with pt able to complete with CGA. Safety cues needed for preventing his unwrapped/exposed residual limb to hit/rub against bed rail, as pt stated "I don't want to wear that shrinker" and it was visibly draining. Min A needed for boosting towards HOB. Therapist encouraged pt to complete bed level self-care, exercises or any OOB tasks, with pt stating "I'm just too tired after today." Pt was left upright in bed, with bed alarm on and all needs in reach at end of session. ? ? ? 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 ? ? ?Therapy/Group: Individual Therapy ? ?Wynne Rozak E Markelle Asaro ?07/09/2021, 7:34 AM ?

## 2021-07-10 DIAGNOSIS — Z89511 Acquired absence of right leg below knee: Secondary | ICD-10-CM | POA: Diagnosis not present

## 2021-07-10 LAB — GLUCOSE, CAPILLARY
Glucose-Capillary: 158 mg/dL — ABNORMAL HIGH (ref 70–99)
Glucose-Capillary: 174 mg/dL — ABNORMAL HIGH (ref 70–99)
Glucose-Capillary: 220 mg/dL — ABNORMAL HIGH (ref 70–99)
Glucose-Capillary: 154 mg/dL — ABNORMAL HIGH (ref 70–99)

## 2021-07-10 LAB — CBC
HCT: 29.2 % — ABNORMAL LOW (ref 39.0–52.0)
Hemoglobin: 9.3 g/dL — ABNORMAL LOW (ref 13.0–17.0)
MCH: 28.7 pg (ref 26.0–34.0)
MCHC: 31.8 g/dL (ref 30.0–36.0)
MCV: 90.1 fL (ref 80.0–100.0)
Platelets: 600 10*3/uL — ABNORMAL HIGH (ref 150–400)
RBC: 3.24 MIL/uL — ABNORMAL LOW (ref 4.22–5.81)
RDW: 16.1 % — ABNORMAL HIGH (ref 11.5–15.5)
WBC: 11.2 10*3/uL — ABNORMAL HIGH (ref 4.0–10.5)
nRBC: 0 % (ref 0.0–0.2)

## 2021-07-10 MED ORDER — ALBUTEROL SULFATE (2.5 MG/3ML) 0.083% IN NEBU
2.5000 mg | INHALATION_SOLUTION | Freq: Four times a day (QID) | RESPIRATORY_TRACT | Status: DC | PRN
  Administered 2021-07-10 – 2021-07-18 (×6): 2.5 mg via RESPIRATORY_TRACT
  Filled 2021-07-10 (×7): qty 3

## 2021-07-10 NOTE — Progress Notes (Addendum)
?                                                       PROGRESS NOTE ? ? ?Subjective/Complaints: ?No C/os ? ?ROS: Patient denies fever, rash, sore throat, blurred vision, dizziness, nausea, vomiting, diarrhea, cough, shortness of breath or chest pain, headache, or mood change.  ? ? ?Objective: ?  ?No results found. ?Recent Labs  ?  07/08/21 ?1428 07/10/21 ?9470  ?WBC 18.6* 11.2*  ?HGB 9.2* 9.3*  ?HCT 27.9* 29.2*  ?PLT 598* 600*  ? ? ?No results for input(s): NA, K, CL, CO2, GLUCOSE, BUN, CREATININE, CALCIUM in the last 72 hours. ? ? ?Intake/Output Summary (Last 24 hours) at 07/10/2021 1144 ?Last data filed at 07/10/2021 0700 ?Gross per 24 hour  ?Intake 290 ml  ?Output 1225 ml  ?Net -935 ml  ? ?  ? ?  ? ?Physical Exam: ?Vital Signs ?Blood pressure 126/62, pulse 76, temperature 98.5 ?F (36.9 ?C), temperature source Oral, resp. rate 16, height 6' (1.829 m), weight 96.7 kg, SpO2 98 %. ? ?Constitutional: No distress . Vital signs reviewed. BMI 28.91 ?Constitutional: No distress . Vital signs reviewed. ?HEENT: NCAT, EOMI, oral membranes moist ?Neck: supple ?Cardiovascular: RRR without murmur. No JVD    ?Respiratory/Chest: CTA Bilaterally without wheezes or rales. Normal effort    ?GI/Abdomen: BS +, non-tender, non-distended ?Ext: no clubbing, cyanosis, or edema ?Psych: pleasant and cooperative, sl anxious ?Skin: right leg still looks similar to below, generally clean ? ? ?Neuro:  Alert and oriented x 3. Normal insight and awareness. Intact Memory. Normal language and speech. Cranial nerve exam unremarkable. Moves all 4's, right leg limted somewhat by pain. Does appear more confused.  ?Musculoskeletal: right BK stump well shaped, tolerating shrinker--still tender with don/doff ?GU: foley in place with red tinged urine in bag ? ? ?Assessment/Plan: ?1. Functional deficits which require 3+ hours per day of interdisciplinary therapy in a comprehensive inpatient rehab setting. ?Physiatrist is providing close team supervision and  24 hour management of active medical problems listed below. ?Physiatrist and rehab team continue to assess barriers to discharge/monitor patient progress toward functional and medical goals ? ?Care Tool: ? ?Bathing ?   ?Body parts bathed by patient: Right arm, Left arm, Chest, Abdomen, Right upper leg, Left upper leg, Right lower leg, Left lower leg, Face  ? Body parts bathed by helper: Buttocks ?Body parts n/a: Right lower leg ?  ?Bathing assist Assist Level: Supervision/Verbal cueing ?  ?  ?Upper Body Dressing/Undressing ?Upper body dressing   ?What is the patient wearing?: Pull over shirt ?   ?Upper body assist Assist Level: Supervision/Verbal cueing ?   ?Lower Body Dressing/Undressing ?Lower body dressing ? ? ?   ?What is the patient wearing?: Pants ? ?  ? ?Lower body assist Assist for lower body dressing: Supervision/Verbal cueing ?   ? ?Toileting ?Toileting Toileting Activity did not occur (Probation officer and hygiene only): N/A (no void or bm)  ?Toileting assist Assist for toileting: Maximal Assistance - Patient 25 - 49% ?  ?  ?Transfers ?Chair/bed transfer ? ?Transfers assist ?   ? ?Chair/bed transfer assist level: Contact Guard/Touching assist (slideboard) ?  ?  ?Locomotion ?Ambulation ? ? ?Ambulation assist ? ? Ambulation activity did not occur: Safety/medical concerns (max assist to stand, unable to achieve full upright w/RW) ? ?  ?  ?   ? ?  Walk 10 feet activity ? ? ?Assist ? Walk 10 feet activity did not occur: Safety/medical concerns ? ?  ?   ? ?Walk 50 feet activity ? ? ?Assist Walk 50 feet with 2 turns activity did not occur: Safety/medical concerns ? ?  ?   ? ? ?Walk 150 feet activity ? ? ?Assist Walk 150 feet activity did not occur: Safety/medical concerns ? ?  ?  ?  ? ?Walk 10 feet on uneven surface  ?activity ? ? ?Assist Walk 10 feet on uneven surfaces activity did not occur: Safety/medical concerns ? ? ?  ?   ? ?Wheelchair ? ? ? ? ?Assist Is the patient using a wheelchair?: Yes ?Type of  Wheelchair: Manual ?  ? ?Wheelchair assist level: Supervision/Verbal cueing ?Max wheelchair distance: 53f  ? ? ?Wheelchair 50 feet with 2 turns activity ? ? ? ?Assist ? ?  ?  ? ? ?Assist Level: Supervision/Verbal cueing  ? ?Wheelchair 150 feet activity  ? ? ? ?Assist ?   ? ? ?Assist Level: Maximal Assistance - Patient 25 - 49%  ? ?Blood pressure 126/62, pulse 76, temperature 98.5 ?F (36.9 ?C), temperature source Oral, resp. rate 16, height 6' (1.829 m), weight 96.7 kg, SpO2 98 %. ? ?Medical Problem List and Plan: ?1. 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            -patient may shower but incision must be covered.  ?            -ELOS/Goals: 4/7 modI ?       -Continue CIR therapies including PT, OT  ?-pt concerned about going home with foley cath, discussed that he may have another voiding trial after abx finished, but that he could be disharged to home with a foley and urology f/u.   ?2.  Antithrombotics: ?-DVT/anticoagulation:  Pharmaceutical: Heparin ?            -antiplatelet therapy: Aspirin 81 mg daily ?3. Phantom limb pain:  ?3/31 due to patient's confusion, will decrease gabapentin to '300mg'$  at bedtime.  ?    -discussed with him that his pain may increase ?4. Insomnia: dc elavil d/t AMS, gabapentin as above ? -continue melatonin '5mg'$  qhs to assist sleep ?            -antipsychotic agents: N/A ?5. Neuropsych: This patient is capable of making decisions on his own behalf. ?6. Skin/Wound Care: Routine skin checks ?7. Fluids/Electrolytes/Nutrition: Routine in and outs with follow-up chemistries ?8.  Acute blood loss anemia.   ?  -hgb stable at 9.9, monitor weekly.  ?9.  NSVT/CAD.  Follow cardiology service.  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  Continue Lopressor 25 mg every 8 hours and considering consolidating to Toprol on discharge.  Follow-up outpatient ?10.  Coffee-ground emesis x1 3/14.  H&H stable.  Plan outpatient follow-up GI services as  needed.  Continue Protonix twice daily. ? -asymptomatic ?11.  History of BPH/urinary retention.  Hytrin 10 mg nightly.   ?3/31 -foley has been replaced--continue thru weekend at least, repeat voiding trial next week ?-will add finasteride to regimen although probably will not have acute effect ?-rx UTI ?-blood is due IC's and UTI's. Hgb stable ?-urology consult as outpt was discussed.  ?12.  Hyperlipidemia.  Crestor ?13.  Hypertension.  Norvasc 10 mg daily, Avapro 300 mg daily.  Monitor with increased mobility ? 3/31 bp controlled ?14.  CLL.  Follow-up outpatient Dr. SAlen Blew  Continue IMBRUVICA  420 mg daily. PATIENT CAN BRING IN OWN SUPPLY IF NEEDED. ?15.  Constipation.  Colace 100 mg twice daily ? -moving bowels ?16.  Hyperglycemia.  Hemoglobin A1c 6.7.  SSI.  Patient with no documented history of diabetes. ?17. Leukocytosis: likely d/t uti see below ?18. Transaminitis: stopped tylenol. Repeat CMP tomorrow.  ?19. Residual limb pain: continue Robaxin 500 mg 3 times daily, oxycodone as needed. Add vitamin C '1000mg'$  daily.  ?20. Hyponatremia ? -may be related to CLL vs meds ? -improved, monitor weekly ?21. UTI:  UC with >100k pseudomonas ? -keflex changed to levaquin for at least 5 days from 3/30 ? -recheck cbc in am to track wbc's- improved to 11K 4/1 ?  ? ?  ? ?LOS: ?9 days ?A FACE TO FACE EVALUATION WAS PERFORMED ? ?Luanna Salk Denys Salinger ?07/10/2021, 11:44 AM  ? ?  ?

## 2021-07-11 DIAGNOSIS — Z89511 Acquired absence of right leg below knee: Secondary | ICD-10-CM | POA: Diagnosis not present

## 2021-07-11 LAB — GLUCOSE, CAPILLARY
Glucose-Capillary: 148 mg/dL — ABNORMAL HIGH (ref 70–99)
Glucose-Capillary: 170 mg/dL — ABNORMAL HIGH (ref 70–99)
Glucose-Capillary: 149 mg/dL — ABNORMAL HIGH (ref 70–99)
Glucose-Capillary: 229 mg/dL — ABNORMAL HIGH (ref 70–99)

## 2021-07-11 NOTE — Progress Notes (Signed)
?                                                       PROGRESS NOTE ? ? ?Subjective/Complaints: ?No C/os, had good BM this am  ? ?ROS: Patient denies CP, SOB, N/V/D  ? ? ?Objective: ?  ?No results found. ?Recent Labs  ?  07/08/21 ?1428 07/10/21 ?7253  ?WBC 18.6* 11.2*  ?HGB 9.2* 9.3*  ?HCT 27.9* 29.2*  ?PLT 598* 600*  ? ? ?No results for input(s): NA, K, CL, CO2, GLUCOSE, BUN, CREATININE, CALCIUM in the last 72 hours. ? ? ?Intake/Output Summary (Last 24 hours) at 07/11/2021 1257 ?Last data filed at 07/11/2021 0700 ?Gross per 24 hour  ?Intake 360 ml  ?Output 3350 ml  ?Net -2990 ml  ? ?  ? ?  ? ?Physical Exam: ?Vital Signs ?Blood pressure 138/65, pulse 74, temperature 98.2 ?F (36.8 ?C), temperature source Oral, resp. rate 17, height 6' (1.829 m), weight 96.7 kg, SpO2 98 %. ? ?Constitutional: No distress . Vital signs reviewed. BMI 28.91 ?Constitutional: No distress . Vital signs reviewed. ?HEENT: NCAT, EOMI, oral membranes moist ?Neck: supple ?Cardiovascular: RRR without murmur. No JVD    ?Respiratory/Chest: CTA Bilaterally without wheezes or rales. Normal effort    ?GI/Abdomen: BS +, non-tender, non-distended ?Ext: no clubbing, cyanosis, or edema ?Psych: pleasant and cooperative, sl anxious ?Skin: right leg still looks similar to below, generally clean ? ? ?Neuro:  Alert and oriented x 3. Normal insight and awareness. Intact Memory. Normal language and speech. Cranial nerve exam unremarkable. Moves all 4's, right leg limted somewhat by pain. Does appear more confused.  ?Musculoskeletal: right BK stump well shaped, tolerating shrinker--still tender with don/doff ?GU: foley in place with red tinged urine in bag ? ? ?Assessment/Plan: ?1. Functional deficits which require 3+ hours per day of interdisciplinary therapy in a comprehensive inpatient rehab setting. ?Physiatrist is providing close team supervision and 24 hour management of active medical problems listed below. ?Physiatrist and rehab team continue to assess  barriers to discharge/monitor patient progress toward functional and medical goals ? ?Care Tool: ? ?Bathing ?   ?Body parts bathed by patient: Right arm, Left arm, Chest, Abdomen, Right upper leg, Left upper leg, Right lower leg, Left lower leg, Face  ? Body parts bathed by helper: Buttocks ?Body parts n/a: Right lower leg ?  ?Bathing assist Assist Level: Supervision/Verbal cueing ?  ?  ?Upper Body Dressing/Undressing ?Upper body dressing   ?What is the patient wearing?: Pull over shirt ?   ?Upper body assist Assist Level: Supervision/Verbal cueing ?   ?Lower Body Dressing/Undressing ?Lower body dressing ? ? ?   ?What is the patient wearing?: Pants ? ?  ? ?Lower body assist Assist for lower body dressing: Supervision/Verbal cueing ?   ? ?Toileting ?Toileting Toileting Activity did not occur (Probation officer and hygiene only): N/A (no void or bm)  ?Toileting assist Assist for toileting: Maximal Assistance - Patient 25 - 49% ?  ?  ?Transfers ?Chair/bed transfer ? ?Transfers assist ?   ? ?Chair/bed transfer assist level: Contact Guard/Touching assist (slideboard) ?  ?  ?Locomotion ?Ambulation ? ? ?Ambulation assist ? ? Ambulation activity did not occur: Safety/medical concerns (max assist to stand, unable to achieve full upright w/RW) ? ?  ?  ?   ? ?Walk 10 feet  activity ? ? ?Assist ? Walk 10 feet activity did not occur: Safety/medical concerns ? ?  ?   ? ?Walk 50 feet activity ? ? ?Assist Walk 50 feet with 2 turns activity did not occur: Safety/medical concerns ? ?  ?   ? ? ?Walk 150 feet activity ? ? ?Assist Walk 150 feet activity did not occur: Safety/medical concerns ? ?  ?  ?  ? ?Walk 10 feet on uneven surface  ?activity ? ? ?Assist Walk 10 feet on uneven surfaces activity did not occur: Safety/medical concerns ? ? ?  ?   ? ?Wheelchair ? ? ? ? ?Assist Is the patient using a wheelchair?: Yes ?Type of Wheelchair: Manual ?  ? ?Wheelchair assist level: Supervision/Verbal cueing ?Max wheelchair distance: 13f   ? ? ?Wheelchair 50 feet with 2 turns activity ? ? ? ?Assist ? ?  ?  ? ? ?Assist Level: Supervision/Verbal cueing  ? ?Wheelchair 150 feet activity  ? ? ? ?Assist ?   ? ? ?Assist Level: Maximal Assistance - Patient 25 - 49%  ? ?Blood pressure 138/65, pulse 74, temperature 98.2 ?F (36.8 ?C), temperature source Oral, resp. rate 17, height 6' (1.829 m), weight 96.7 kg, SpO2 98 %. ? ?Medical Problem List and Plan: ?1. 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            -patient may shower but incision must be covered.  ?            -ELOS/Goals: 4/7 modI ?       -Continue CIR therapies including PT, OT  ?-pt concerned about going home with foley cath, discussed that he may have another voiding trial after abx finished, but that he could be disharged to home with a foley and urology f/u.   ?2.  Antithrombotics: ?-DVT/anticoagulation:  Pharmaceutical: Heparin ?            -antiplatelet therapy: Aspirin 81 mg daily ?3. Phantom limb pain:  ?3/31 due to patient's confusion, will decrease gabapentin to '300mg'$  at bedtime.  ?    -discussed with him that his pain may increase ?4. Insomnia: dc elavil d/t AMS, gabapentin as above ? -continue melatonin '5mg'$  qhs to assist sleep ?            -antipsychotic agents: N/A ?5. Neuropsych: This patient is capable of making decisions on his own behalf. ?6. Skin/Wound Care: Routine skin checks ?7. Fluids/Electrolytes/Nutrition: Routine in and outs with follow-up chemistries ?8.  Acute blood loss anemia.   ?  -hgb stable at 9.9, monitor weekly.  ?9.  NSVT/CAD.  Follow cardiology service.  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  Continue Lopressor 25 mg every 8 hours and considering consolidating to Toprol on discharge.  Follow-up outpatient ?10.  Coffee-ground emesis x1 3/14.  H&H stable.  Plan outpatient follow-up GI services as needed.  Continue Protonix twice daily. ? -asymptomatic ?11.  History of BPH/urinary retention.  Hytrin 10 mg  nightly.   ?3/31 -foley has been replaced--continue thru weekend at least, repeat voiding trial next week ?-will add finasteride to regimen although probably will not have acute effect ?-rx UTI ?-blood is due IC's and UTI's. Hgb stable ?-urology consult as outpt was discussed.  ?12.  Hyperlipidemia.  Crestor ?13.  Hypertension.  Norvasc 10 mg daily, Avapro 300 mg daily.  Monitor with increased mobility ? 3/31 bp controlled ?14.  CLL.  Follow-up outpatient Dr. SAlen Blew  Continue IMBRUVICA 420 mg daily.  PATIENT CAN BRING IN OWN SUPPLY IF NEEDED. ?15.  Constipation.  Colace 100 mg twice daily ? -moving bowels ?16.  Hyperglycemia.  Hemoglobin A1c 6.7.  SSI.  Patient with no documented history of diabetes. ?17. Leukocytosis: likely d/t uti see below ?18. Transaminitis: stopped tylenol. Repeat CMP tomorrow.  ?19. Residual limb pain: continue Robaxin 500 mg 3 times daily, oxycodone as needed. Add vitamin C '1000mg'$  daily.  ?20. Hyponatremia ? -may be related to CLL vs meds ? -improved, monitor weekly ?21. UTI:  UC with >100k pseudomonas ? -keflex changed to levaquin for at least 5 days from 3/30 ? -recheck cbc in am to track wbc's- improved to 11K 4/1 ?  ? ?  ? ?LOS: ?10 days ?A FACE TO FACE EVALUATION WAS PERFORMED ? ?Luanna Salk Luanne Krzyzanowski ?07/11/2021, 12:57 PM  ? ?  ?

## 2021-07-11 NOTE — Progress Notes (Signed)
Physical Therapy Session Note ? ?Patient Details  ?Name: Douglas [name] ?MRN: [id number] ?Date of Birth: 09-01-1950 ? ?Today's Date: 07/11/2021 ?PT Individual Time: 1300-1400 ?PT Individual Time Calculation (min): 60 min  ? ?Short Term Goals: ?Week 2:  PT Short Term Goal 1 (Week 2): STG=LTG due to LOS ? ?Skilled Therapeutic Interventions/Progress Updates:  ?   ?Patient in bed upon PT arrival. Patient alert and agreeable to PT session. Patient reported 4-5/10 R residual limb pain during session, RN made aware. PT provided repositioning, rest breaks, and distraction as pain interventions throughout session.  ? ?Initiated session with focus on transfer training. Patient with increased sanguinous drainage from residual limb following first transfer to w/c without injury to limb. Removed shrinker sock with multiple small areas of drainage at anterior tibial blister site, transverse incision site, and moderate sanguinous drainage from medial calf incision site, RN made aware and changed dressings during assessment. Noted dehiscence of medial calf incision and increased edema with pitting and increased warms to touch and mild erythema to lateral and posterior calf, per RN these were notable changes from last week. MD made aware and assessed patient during session.   ? ?Educated patient on assessing his residual limb daily for changes, signs/symptoms of infection, importance of compression and elevation to reduce edema and promote wound healing, and importance of promoting knee extension to reduce contractures. Patient receptive to all education, does report difficulty recalling education provided during therapies and poor tolerance of wearing shrinker sock due to increased residual limb sensitivity. ? ?Patient became hot, SOB, and "woozy" during limb assessment and education, requested to return to bed. Vitals: BP: 125/64, HR 80, SPO2 99%. RN made aware. Educated patient on use of diaphragmatic breathing to reduce sympathetic  response to concerns regarding his residual limb.  ? ?Therapeutic Activity: ?Bed Mobility: Patient performed supine to/from sit with supervision in a flat bed with use of bed rail. Attempted without bed rail, however, patient unable to manage trunk control without the rail at a supervision level and had poor frustration tolerance for this.  ?Transfers: Patient performed a slide board transfer bed>w/c with supervision and total for board placement. He performed a lateral scoot transfer w/c>bed with min A for hip clearance. Provided cues for hand placement, board placement, and head-hips relationship for proper technique and decreased assist with transfers.  ? ?Patient in bed at end of session with breaks locked, bed alarm set, and all needs within reach.  ? ?Educated on Geographical information systems officer and washed shrinker socks and hung them to dry in the patient's bathroom at end of session. ? 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 ? ? ?Therapy/Group: Individual Therapy ? ?Doreene Burke PT, DPT ? ?07/11/2021, 4:48 PM  ?

## 2021-07-11 NOTE — Progress Notes (Signed)
Asked by PT to look at BK incision  ?07/01/21 ? ?07/11/21 ? ?07/01/21 ? ?07/11/21 ? ? ? ?Medial incision looks same 3/23 vs 4/2 /23 ? ?More ecchymotic bulla formation no new infection suspected, cont to monitor  ?

## 2021-07-12 DIAGNOSIS — I739 Peripheral vascular disease, unspecified: Secondary | ICD-10-CM | POA: Diagnosis not present

## 2021-07-12 DIAGNOSIS — N390 Urinary tract infection, site not specified: Secondary | ICD-10-CM | POA: Diagnosis not present

## 2021-07-12 DIAGNOSIS — G8918 Other acute postprocedural pain: Secondary | ICD-10-CM | POA: Diagnosis not present

## 2021-07-12 DIAGNOSIS — Z89511 Acquired absence of right leg below knee: Secondary | ICD-10-CM | POA: Diagnosis not present

## 2021-07-12 LAB — GLUCOSE, CAPILLARY
Glucose-Capillary: 157 mg/dL — ABNORMAL HIGH (ref 70–99)
Glucose-Capillary: 177 mg/dL — ABNORMAL HIGH (ref 70–99)
Glucose-Capillary: 157 mg/dL — ABNORMAL HIGH (ref 70–99)
Glucose-Capillary: 137 mg/dL — ABNORMAL HIGH (ref 70–99)

## 2021-07-12 MED ORDER — TERAZOSIN HCL 5 MG PO CAPS
15.0000 mg | ORAL_CAPSULE | Freq: Every day | ORAL | Status: DC
  Administered 2021-07-12 – 2021-07-18 (×7): 15 mg via ORAL
  Filled 2021-07-12 (×7): qty 3

## 2021-07-12 NOTE — Progress Notes (Signed)
Patient ID: Douglas [name], male   DOB: 1951-01-08, 71 y.o.   MRN: [id number] ?Follow up with nursing and patient regarding concerns about pending discharge. Patient concerned about foley management and dressing changes. Reviewed foley care with instructions with the patient and family education with wife scheduled for 07/13/21 and 07/14/21. Also reviewed dressing change to limb. MD noted plans to change insulin to oral agent for discharge and d/c heparin at discharge. No other concerns noted at present. Continue to follow along with patient to discharge to address questions to facilitate preparation for discharge. Dorien Chihuahua B ? ?

## 2021-07-12 NOTE — Progress Notes (Signed)
Occupational Therapy Session Note ? ?Patient Details  ?Name: Douglas [name] ?MRN: [id number] ?Date of Birth: [birth year] ? ?Today's Date: 07/12/2021 ?OT Individual Time: 6967-8938 ?OT Individual Time Calculation (min): 43 min  ? ? ?Short Term Goals: ?Week 2:  OT Short Term Goal 1 (Week 2): STG = LTG due to ELOS ? ?Skilled Therapeutic Interventions/Progress Updates:  ?Skilled OT intervention completed with focus on tub/shower transfer, activity tolerance, contracture prevention. Pt received seated in w/c, agreeable to session. Transported pt with total A for time management <> ADL bathroom. With demonstration and cues provided, pt was able to return demonstrate tub bench transfer from w/c <> bench, with supervision, via slideboard with total A needed for board placement. Education provided regarding safety techniques, lateral leans to avoid standing, and shower curtain management. Pt reported he and his wife expect to move sometime in may to ADA compliant apartment, however at this time, bathrooms are on the 2nd floor of their home which make them inaccessible. Therapist advised that pt complete sponge bathing at sink for maximized independence, then reassess situation at new place if they end of living in new place. Educated about the use of shower tray for hair washing. Encouraged pt to self-propel in w/c part of way back to room, with pt not giving much effort, but able to propel only about 10 ft before saying "I'm not doing this anymore." Educated pt on importance of w/c mobility 2/2 pt's d/c function of w/c level only, however pt stated "I'm gonna have my wife for that." ? ?Therapist encouraged pt to sit up for lunch however declined. Pt completed slideboard placement with min A, cues needed for accuracy with placement, as therapist handed pt the board and he attempted to slide without it being underneath him. Then able to slide with supervision to EOB, then supervised bed mobility, doffing of pants and socks.  Upright in bed, and lower bed flat, pt completed x10 knee extension stretches for contracture prevention. Pt was left upright in bed, with bed alarm on and all needs in reach at end of session.  ? ? 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 ?Pain: ?No c/o pain ? ? ?Therapy/Group: Individual Therapy ? ?[name] E [name] ?07/12/2021, 7:44 AM ?

## 2021-07-12 NOTE — Progress Notes (Signed)
?                                                       PROGRESS NOTE ? ? ?Subjective/Complaints: ?No new issues. Urine cleared up. Feels better with less pain medication. Pain actually seems well controlled ? ?ROS: Patient denies fever, rash, sore throat, blurred vision, dizziness, nausea, vomiting, diarrhea, cough, shortness of breath or chest pain,   back/neck pain, headache, or mood change.  ? ?Objective: ?  ?No results found. ?Recent Labs  ?  07/10/21 ?7564  ?WBC 11.2*  ?HGB 9.3*  ?HCT 29.2*  ?PLT 600*  ? ?No results for input(s): NA, K, CL, CO2, GLUCOSE, BUN, CREATININE, CALCIUM in the last 72 hours. ? ? ?Intake/Output Summary (Last 24 hours) at 07/12/2021 1052 ?Last data filed at 07/12/2021 0700 ?Gross per 24 hour  ?Intake 240 ml  ?Output 450 ml  ?Net -210 ml  ?  ? ?  ? ?Physical Exam: ?Vital Signs ?Blood pressure 129/78, pulse 80, temperature 98.1 ?F (36.7 ?C), temperature source Oral, resp. rate 18, height 6' (1.829 m), weight 96.7 kg, SpO2 96 %. ? ?Constitutional: No distress . Vital signs reviewed. ?HEENT: NCAT, EOMI, oral membranes moist ?Neck: supple ?Cardiovascular: RRR without murmur. No JVD    ?Respiratory/Chest: CTA Bilaterally without wheezes or rales. Normal effort    ?GI/Abdomen: BS +, non-tender, non-distended ?Ext: no clubbing, cyanosis, or edema ?Psych: pleasant and cooperative  ?Skin: right stable with eschar, dried blood.   ? ?Neuro:  Alert and oriented x 3. Normal insight and awareness. Intact Memory. Normal language and speech. Cranial nerve exam unremarkable. Moves all 4's, right leg limted somewhat by pain. Does appear more confused.  ?Musculoskeletal: right BK stump well shaped, tolerating shrinker--still tender with don/doff ?GU: foley in place with clear, yellow urine ? ? ?Assessment/Plan: ?1. Functional deficits which require 3+ hours per day of interdisciplinary therapy in a comprehensive inpatient rehab setting. ?Physiatrist is providing close team supervision and 24 hour management  of active medical problems listed below. ?Physiatrist and rehab team continue to assess barriers to discharge/monitor patient progress toward functional and medical goals ? ?Care Tool: ? ?Bathing ?   ?Body parts bathed by patient: Right arm, Left arm, Chest, Abdomen, Right upper leg, Left upper leg, Right lower leg, Left lower leg, Face  ? Body parts bathed by helper: Buttocks ?Body parts n/a: Right lower leg ?  ?Bathing assist Assist Level: Supervision/Verbal cueing ?  ?  ?Upper Body Dressing/Undressing ?Upper body dressing   ?What is the patient wearing?: Pull over shirt ?   ?Upper body assist Assist Level: Supervision/Verbal cueing ?   ?Lower Body Dressing/Undressing ?Lower body dressing ? ? ?   ?What is the patient wearing?: Pants ? ?  ? ?Lower body assist Assist for lower body dressing: Supervision/Verbal cueing ?   ? ?Toileting ?Toileting Toileting Activity did not occur (Probation officer and hygiene only): N/A (no void or bm)  ?Toileting assist Assist for toileting: Maximal Assistance - Patient 25 - 49% ?  ?  ?Transfers ?Chair/bed transfer ? ?Transfers assist ?   ? ?Chair/bed transfer assist level: Supervision/Verbal cueing (slideboard) ?  ?  ?Locomotion ?Ambulation ? ? ?Ambulation assist ? ? Ambulation activity did not occur: Safety/medical concerns (max assist to stand, unable to achieve full upright w/RW) ? ?  ?  ?   ? ?  Walk 10 feet activity ? ? ?Assist ? Walk 10 feet activity did not occur: Safety/medical concerns ? ?  ?   ? ?Walk 50 feet activity ? ? ?Assist Walk 50 feet with 2 turns activity did not occur: Safety/medical concerns ? ?  ?   ? ? ?Walk 150 feet activity ? ? ?Assist Walk 150 feet activity did not occur: Safety/medical concerns ? ?  ?  ?  ? ?Walk 10 feet on uneven surface  ?activity ? ? ?Assist Walk 10 feet on uneven surfaces activity did not occur: Safety/medical concerns ? ? ?  ?   ? ?Wheelchair ? ? ? ? ?Assist Is the patient using a wheelchair?: Yes ?Type of Wheelchair: Manual ?   ? ?Wheelchair assist level: Supervision/Verbal cueing ?Max wheelchair distance: 28f  ? ? ?Wheelchair 50 feet with 2 turns activity ? ? ? ?Assist ? ?  ?  ? ? ?Assist Level: Supervision/Verbal cueing  ? ?Wheelchair 150 feet activity  ? ? ? ?Assist ?   ? ? ?Assist Level: Maximal Assistance - Patient 25 - 49%  ? ?Blood pressure 129/78, pulse 80, temperature 98.1 ?F (36.7 ?C), temperature source Oral, resp. rate 18, height 6' (1.829 m), weight 96.7 kg, SpO2 96 %. ? ?Medical Problem List and Plan: ?1. 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            -patient may shower but incision must be covered.  ?            -ELOS/Goals: 4/7 modI ?       -Continue CIR therapies including PT, OT  ?-pt in better spirits today ?2.  Antithrombotics: ?-DVT/anticoagulation:  Pharmaceutical: Heparin ?            -antiplatelet therapy: Aspirin 81 mg daily ?3. Phantom limb pain:  ?-decreased gabapentin to '300mg'$  at bedtime.  ?    -pain controlled ?4. Insomnia: dc elavil d/t AMS, gabapentin decreased as above ? -continue melatonin '5mg'$  qhs to assist sleep ?            -antipsychotic agents: N/A ?5. Neuropsych: This patient is capable of making decisions on his own behalf. ?6. Skin/Wound Care: Routine skin checks ?7. Fluids/Electrolytes/Nutrition: Routine in and outs with follow-up chemistries ?8.  Acute blood loss anemia.   ?  -hgb stable at 9.3 4/1---continue to monitor ?9.  NSVT/CAD.  Follow cardiology service.  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  Continue Lopressor 25 mg every 8 hours and considering consolidating to Toprol on discharge.  Follow-up outpatient ?10.  Coffee-ground emesis x1 3/14.  H&H stable.  Plan outpatient follow-up GI services as needed.  Continue Protonix twice daily. ? -asymptomatic ?11.  History of BPH/urinary retention.      ? 4/3 foley has been replaced--continue foley today at leaset ?-added finasteride to regimen although probably will not have acute  effect ?-increasd hytrin to '15mg'$   ?-rx UTI ?-blood is due IC's and UTI's. Hgb stable ?-urology consult as outpt was discussed.  ?12.  Hyperlipidemia.  Crestor ?13.  Hypertension.  Norvasc 10 mg daily, Avapro 300 mg daily.  Monitor with increased mobility ? 3/31 bp controlled ?14.  CLL.  Follow-up outpatient Dr. SAlen Blew  Continue IMBRUVICA 420 mg daily. PATIENT CAN BRING IN OWN SUPPLY IF NEEDED. ?15.  Constipation.  Colace 100 mg twice daily ? -moving bowels ?16.  Hyperglycemia.  Hemoglobin A1c 6.7.  SSI.  Patient with no documented history of diabetes. ?17. Leukocytosis: likely  d/t uti see below ?18. Transaminitis: stopped tylenol. Repeat CMP tomorrow.  ?19. Residual limb pain: continue Robaxin 500 mg 3 times daily, oxycodone as needed. Add vitamin C '1000mg'$  daily.  ?20. Hyponatremia ? -may be related to CLL vs meds ? -improved, monitor weekly ?21. UTI:  UC with >100k pseudomonas ? -keflex changed to levaquin for at least 5 days from 3/30 ? -recheck cbc in am to track wbc's- improved to 11K 4/1 ?  ? ?  ? ?LOS: ?11 days ?A FACE TO FACE EVALUATION WAS PERFORMED ? ?Meredith Staggers ?07/12/2021, 10:52 AM  ? ?   ?

## 2021-07-12 NOTE — Consult Note (Signed)
?Neuropsychological Consultation ? ? ?Patient:   Douglas [name]  ? ?DOB:   [birth year] ? ?MR Number:  [id number] ? ?Location:  [place] [facility] ?[id number] [facility] ?[place] [address] ?Dept: [phone] ?Loc: [phone] ?          ?Date of Service:   07/12/2021 ? ?Start Time:   10 AM ?End Time:   11 AM                                                                                          ? ?Provider/Observer:  Ilean Skill, Psy.D.   ?    Clinical Neuropsychologist ?     ? ?Billing Code/Service: [id number] ? ?Chief Complaint:    Douglas [name] is a 71 year old male with history of BPH, hypertension, CAD with pacemaker, prior tobacco use, CLL diagnosed 11/2020.  Patient presented on 06/18/2021 with right foot swelling and ischemic change as well as pain.  Patient had been treated previously by podiatrist for plantar fasciitis and metatarsal condition.  Vascular surgery was consulted and underwent angiogram that showed flow-limiting stenosis.  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  Patient with decreased functional mobility and was admitted for CIR.  Patient also with ongoing phantom limb pain. ? ?Reason for Service:  Patient was referred for neuropsychological consultation due to coping and adjustment issues with recent right BKA.  Below is the HPI for the current admission. ? ?HPI: Douglas [name] is a 71 year old right-handed male with history of BPH, hypertension, CAD with pacemaker 01/19/2021 per Dr. Osie Cheeks, history of tobacco use, CLL diagnosed 11/2018 followed by Dr. Alen Blew.  Per chart review patient lives with spouse.  Two-level home 3 steps to entry.  Independent driving prior to admission.  Wife works from home.  Presented 06/18/2021 with right foot swelling ischemic change as well as rest pain.  Patient had recently been treated for plantar fasciitis and metatarsal  condition with podiatry 2 weeks ago.  Vascular surgery consulted underwent angiogram that showed the aorta and iliac segments free of flow-limiting stenosis.  Bilateral renal arteries patent.  Right lower extremity SFA occludes at the takeoff.  He reconstitutes above the knee where it is diseased and below the knee noted to be healthier.  Patient underwent right common femoral to below-knee popliteal artery bypass with nonreversed ipsilateral translocated greater saphenous vein with right posterior tibial ankle exposure and thrombectomy 06/22/2021 per Dr. Donzetta Matters.  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 Donzetta Matters.  Nonweightbearing right lower extremity.  Placed on subcutaneous heparin for DVT prophylaxis.  Developed episode of NSVT postoperatively cardiology service was consulted with echocardiogram completed showing ejection fraction of 50 to 55% the left ventricle demonstrated regional wall motion abnormality.  He was placed on Lopressor twice daily.  Acute blood loss anemia 10.6 and monitored.  Patient did have an episode of coffee-ground emesis 3/14 and placed on Protonix twice daily.  He did have bouts of urinary retention requiring a Foley catheter tube  with planned voiding trial.  Patient was asymptomatic and close monitoring of hemoglobin/hematocrit.  Therapy evaluations completed due to patient decreased functional mobility was admitted for a comprehensive rehab program. Currently with phantom limb pain. ? ?Current Status:  Patient was awake and alert sitting up in his wheelchair as I entered the room.  Patient denied significant depression but acknowledged anxiety about his status.  Patient had a number of medical issues prior to the development of his vascular issues in his right foot.  Patient has concerns about his overall medical status and medical conditions and how they will be impacted by his recent BKA.  Patient  acknowledges worry about wound healing and other issues going forward.  Patient describes phantom limb pain but it is decreasing with time.  Patient denied his anxiety or depressive type symptoms for inhibiting his ability to fully participate in therapeutic interventions.  He does acknowledge apprehension around his wound site and his recovery and intrusive thoughts about his overall medical status including CCL. ? ?Behavioral Observation: Douglas [name]  presents as a 71 y.o.-year-old Right handed Caucasian Male who appeared his stated age. his dress was Appropriate and he was Well Groomed and his manners were Appropriate to the situation.  his participation was indicative of Appropriate and Attentive behaviors.  There were physical disabilities noted.  he displayed an appropriate level of cooperation and motivation.   ? ? ?Interactions:    Active Appropriate and Attentive ? ?Attention:   within normal limits and attention span and concentration were age appropriate ? ?Memory:   within normal limits; recent and remote memory intact ? ?Visuo-spatial:  not examined ? ?Speech (Volume):  low ? ?Speech:   normal; normal ? ?Thought Process:  Coherent and Relevant ? ?Though Content:  WNL; not suicidal and not homicidal ? ?Orientation:   person, place, time/date, and situation ? ?Judgment:   Good ? ?Planning:   Fair ? ?Affect:    Anxious ? ?Mood:    Anxious ? ?Insight:   Good ? ?Intelligence:   normal ? ?Substance Use:  There is a documented history of tobacco abuse confirmed by the patient.   ? ?Medical History:   ?Past Medical History:  ?Diagnosis Date  ? BPH (benign prostatic hyperplasia)   ? CLL (chronic lymphocytic leukemia) ([place])   ? Hypertension   ? Pacemaker   ? Medtronic Device  ? ? ?     ?Patient Active Problem List  ? Diagnosis Date Noted  ? PAD (peripheral artery disease) ([place])   ? Post-op pain   ? Urine retention   ? Right below-knee amputee ([place]) 07/01/2021  ? Malnutrition of moderate degree 06/28/2021  ?  Hypokalemia 06/25/2021  ? Constipation 06/24/2021  ? Physical debility 06/23/2021  ? Coffee ground emesis 06/22/2021  ? Critical limb ischemia of right lower extremity ([place]) 06/18/2021  ? Pacemaker 04/30/2021  ? Elevated troponin   ? Mobitz type 1 second degree AV block   ? CLL (chronic lymphocytic leukemia) ([place]) 01/16/2021  ? NSTEMI (non-ST elevated myocardial infarction) ([place]) 01/16/2021  ? Hypertension 01/16/2021  ? Diabetes mellitus ([place]) 01/16/2021  ? BPH (benign prostatic hyperplasia) 01/16/2021  ? ?  ?Psychiatric History:  No prior psychiatric history ? ?Family Med/Psych History:  ?Family History  ?Problem Relation Age of Onset  ? Lung cancer Mother   ? Coronary artery disease Father   ? ?Impression/DX:  Douglas [name] is a 71 year old male with history of BPH, hypertension, CAD with pacemaker, prior tobacco use, CLL diagnosed 11/2020.  Patient presented on 06/18/2021 with right foot swelling and ischemic change as well as pain.  Patient had been treated previously by podiatrist for plantar fasciitis and metatarsal condition.  Vascular surgery was consulted and underwent angiogram that showed flow-limiting stenosis.  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  Patient with decreased functional mobility and was admitted for CIR.  Patient also with ongoing phantom limb pain. ? ?Patient was awake and alert sitting up in his wheelchair as I entered the room.  Patient denied significant depression but acknowledged anxiety about his status.  Patient had a number of medical issues prior to the development of his vascular issues in his right foot.  Patient has concerns about his overall medical status and medical conditions and how they will be impacted by his recent BKA.  Patient acknowledges worry about wound healing and other issues going forward.  Patient describes phantom limb pain but it is decreasing with time.  Patient denied his anxiety or depressive type  symptoms for inhibiting his ability to fully participate in therapeutic interventions.  He does acknowledge apprehension around his wound site and his recovery and intrusive thoughts about his overall medical status

## 2021-07-12 NOTE — Progress Notes (Signed)
Physical Therapy Session Note ? ?Patient Details  ?Name: Douglas [name] ?MRN: [id number] ?Date of Birth: [birth year] ? ?Today's Date: 07/12/2021 ?PT Individual Time: 5397-6734 ?PT Individual Time Calculation (min): 54 min  ? ?Short Term Goals: ?Week 1:  PT Short Term Goal 1 (Week 1): Pt will perform sit to stand from wc w/mod assist of 1 ?PT Short Term Goal 1 - Progress (Week 1): Not met ?PT Short Term Goal 2 (Week 1): pt will transfer bed to/from wc w/LRAD and mod assist ?PT Short Term Goal 2 - Progress (Week 1): Met ?PT Short Term Goal 3 (Week 1): Pt will propel wc 169f w/additional time ?PT Short Term Goal 3 - Progress (Week 1): Progressing toward goal ?PT Short Term Goal 4 (Week 1): Pt will tolerate gait assessment w/LRAD ?PT Short Term Goal 4 - Progress (Week 1): Not met ?Week 2:  PT Short Term Goal 1 (Week 2): STG=LTG due to LOS ? ?Skilled Therapeutic Interventions/Progress Updates:  ? Received pt semi-reclined in bed, pt agreeable to PT treatment, and reported pain 1-2/10 in R residual limb. Pt requesting to "sponge bathe" from EOB. Session with emphasis on dressing, functional mobility/transfers, generalized strengthening and endurance, and discharge planning. Pt transferred semi-reclined<>sitting EOB with HOB elevated and use of bedrails with supervision and increased effort due to poor trunk control. Doffed dirty shirt with supervision and washed upper body/face sitting EOB with set up assist. Returned to supine with supervision and donned pants with mod A to thread catheter through but pt able to bridge and pull pants over hips without assist. Returned to sitting EOB and donned L shoe with max A. Pt transferred bed<>WC via sideboard with supervision and total A to place board. Pt performed WC mobility 214fusing BUE and supervision - limited by fatigue and transported remainder of way to dayroom in WC dependently. Pt performed LLE strengthening on Kinetron at 20 cm/sec for 1 minute x 4 trials with therapist  providing manual counter resistance with emphasis on glute/quad strengthening. Pt reported concerns about managing his own insulin injections as well as managing wound dressing and foley - spoke with charge nurse who reported further hands on training with the wife will happen this week. Worked on dynamic sitting balance batting beach ball with 1lb dowel to fatigue 1x23 and 1x21 with emphasis on UE strength/endurance and core strengthening. Pt declined propelling WC back to room but agreed to remain sitting up for Neuropsych appointment. Concluded session with pt sitting in WC, needs within reach, and seatbelt alarm on. Provided pt with fresh drink and notified RN to administer pain medication.  ? 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 ?Therapy/Group: Individual Therapy ?Douglas [name], Douglas [name]  ?07/12/2021, 7:22 AM  ?

## 2021-07-12 NOTE — Progress Notes (Signed)
Occupational Therapy Session Note ? ?Patient Details  ?Name: Douglas [name] ?MRN: [id number] ?Date of Birth: April 20, 1950 ? ?Today's Date: 07/12/2021 ?OT Individual Time: 3875-6433 ?OT Individual Time Calculation (min): 25 min  ? ? ?Short Term Goals: ?Week 2:  OT Short Term Goal 1 (Week 2): STG = LTG due to ELOS ? ?Skilled Therapeutic Interventions/Progress Updates:  ?Pt greeted supine in bed reporting ABD pain and pain in foot from dressing change, however pt was   agreeable to OT intervention from bed level. Session focus on BUE strength and endurance for higher level functional mobility tasks. Pt completed below therex with level 3 theraband: ?X10 bicep curls ?X10 shoulder horizontal ABD ?X10 shoulder diagonal pulls ?X10 shoulder extension  ?X10 alternating punches ? ?Of note modified punches as pt developing cramp in thumb with OTA tying band around pts wrist so pt wouldn't have to grip band for pain mgmt.  ? ?Issued pt written HEP to increase carryover  ?Pt left supine in bed with bed alarm activated and all needs within reach.  ? 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 ?Pain: unrated pain in foot and ABD, rest breaks provided as needed with set- up of session modified to bed level d/t pain.  ? ? ? ?Therapy/Group: Individual Therapy ? ?[name] ?07/12/2021, 3:34 PM ?

## 2021-07-13 DIAGNOSIS — Z89511 Acquired absence of right leg below knee: Secondary | ICD-10-CM | POA: Diagnosis not present

## 2021-07-13 DIAGNOSIS — G8918 Other acute postprocedural pain: Secondary | ICD-10-CM | POA: Diagnosis not present

## 2021-07-13 DIAGNOSIS — I739 Peripheral vascular disease, unspecified: Secondary | ICD-10-CM | POA: Diagnosis not present

## 2021-07-13 DIAGNOSIS — N390 Urinary tract infection, site not specified: Secondary | ICD-10-CM | POA: Diagnosis not present

## 2021-07-13 LAB — GLUCOSE, CAPILLARY
Glucose-Capillary: 142 mg/dL — ABNORMAL HIGH (ref 70–99)
Glucose-Capillary: 149 mg/dL — ABNORMAL HIGH (ref 70–99)
Glucose-Capillary: 148 mg/dL — ABNORMAL HIGH (ref 70–99)
Glucose-Capillary: 196 mg/dL — ABNORMAL HIGH (ref 70–99)

## 2021-07-13 MED ORDER — TRAZODONE HCL 50 MG PO TABS
50.0000 mg | ORAL_TABLET | Freq: Every evening | ORAL | Status: DC | PRN
  Administered 2021-07-16 – 2021-07-18 (×3): 50 mg via ORAL
  Filled 2021-07-13 (×3): qty 1

## 2021-07-13 NOTE — Plan of Care (Signed)
?  Problem: RH Balance ?Goal: LTG Patient will maintain dynamic standing balance (PT) ?Description: LTG:  Patient will maintain dynamic standing balance with assistance during mobility activities (PT) ?Outcome: Not Applicable ?Flowsheets (Taken 07/13/2021 0714) ?LTG: Pt will maintain dynamic standing balance during mobility activities with:: (D/C - pt refuses) -- ?Note: D/C - pt refuses ?  ?Problem: Sit to Stand ?Goal: LTG:  Patient will perform sit to stand with assistance level (PT) ?Description: LTG:  Patient will perform sit to stand with assistance level (PT) ?Outcome: Not Applicable ?Flowsheets (Taken 07/13/2021 0714) ?LTG: PT will perform sit to stand in preparation for functional mobility with assistance level: (D/C - pt refuses) -- ?Note: D/C - pt refuses ?  ?

## 2021-07-13 NOTE — Progress Notes (Signed)
?                                                       PROGRESS NOTE ? ? ?Subjective/Complaints: ?Didn't sleep that well last night. Received melatonin but it didn't do much for him ? ? ?ROS: Patient denies fever, rash, sore throat, blurred vision, dizziness, nausea, vomiting, diarrhea, cough, shortness of breath or chest pain, headache, or mood change.  ? ?Objective: ?  ?No results found. ?No results for input(s): WBC, HGB, HCT, PLT in the last 72 hours. ? ?No results for input(s): NA, K, CL, CO2, GLUCOSE, BUN, CREATININE, CALCIUM in the last 72 hours. ? ? ?Intake/Output Summary (Last 24 hours) at 07/13/2021 1145 ?Last data filed at 07/13/2021 0900 ?Gross per 24 hour  ?Intake 360 ml  ?Output 1900 ml  ?Net -1540 ml  ?  ? ?  ? ?Physical Exam: ?Vital Signs ?Blood pressure 132/70, pulse 77, temperature 98.1 ?F (36.7 ?C), temperature source Oral, resp. rate 18, height 6' (1.829 m), weight 96.7 kg, SpO2 98 %. ? ?Constitutional: No distress . Vital signs reviewed. Appears a little fatigued ?HEENT: NCAT, EOMI, oral membranes moist ?Neck: supple ?Cardiovascular: RRR without murmur. No JVD    ?Respiratory/Chest: CTA Bilaterally without wheezes or rales. Normal effort    ?GI/Abdomen: BS +, non-tender, non-distended ?Ext: no clubbing, cyanosis, or edema ?Psych: pleasant and cooperative  ?Skin: right stump with eschar mild bleeding from incision  ? ?Neuro:  Alert and oriented x 3. Normal insight and awareness. Intact Memory. Normal language and speech. Cranial nerve exam unremarkable. Moves all 4's, right leg limted somewhat by pain. Does appear more confused.  ?Musculoskeletal: right stump sensitive, shrinker in place ?GU: foley in place with clear, yellow urine still ? ? ?Assessment/Plan: ?1. Functional deficits which require 3+ hours per day of interdisciplinary therapy in a comprehensive inpatient rehab setting. ?Physiatrist is providing close team supervision and 24 hour management of active medical problems listed  below. ?Physiatrist and rehab team continue to assess barriers to discharge/monitor patient progress toward functional and medical goals ? ?Care Tool: ? ?Bathing ?   ?Body parts bathed by patient: Right arm, Left arm, Chest, Abdomen, Right upper leg, Left upper leg, Right lower leg, Left lower leg, Face  ? Body parts bathed by helper: Buttocks ?Body parts n/a: Right lower leg ?  ?Bathing assist Assist Level: Supervision/Verbal cueing ?  ?  ?Upper Body Dressing/Undressing ?Upper body dressing   ?What is the patient wearing?: Pull over shirt ?   ?Upper body assist Assist Level: Set up assist ?   ?Lower Body Dressing/Undressing ?Lower body dressing ? ? ?   ?What is the patient wearing?: Incontinence brief ? ?  ? ?Lower body assist Assist for lower body dressing: Moderate Assistance - Patient 50 - 74% ?   ? ?Toileting ?Toileting Toileting Activity did not occur (Probation officer and hygiene only): N/A (no void or bm)  ?Toileting assist Assist for toileting: Minimal Assistance - Patient > 75% ?  ?  ?Transfers ?Chair/bed transfer ? ?Transfers assist ?   ? ?Chair/bed transfer assist level: Supervision/Verbal cueing (slideboard) ?  ?  ?Locomotion ?Ambulation ? ? ?Ambulation assist ? ? Ambulation activity did not occur: Safety/medical concerns (max assist to stand, unable to achieve full upright w/RW) ? ?  ?  ?   ? ?  Walk 10 feet activity ? ? ?Assist ? Walk 10 feet activity did not occur: Safety/medical concerns ? ?  ?   ? ?Walk 50 feet activity ? ? ?Assist Walk 50 feet with 2 turns activity did not occur: Safety/medical concerns ? ?  ?   ? ? ?Walk 150 feet activity ? ? ?Assist Walk 150 feet activity did not occur: Safety/medical concerns ? ?  ?  ?  ? ?Walk 10 feet on uneven surface  ?activity ? ? ?Assist Walk 10 feet on uneven surfaces activity did not occur: Safety/medical concerns ? ? ?  ?   ? ?Wheelchair ? ? ? ? ?Assist Is the patient using a wheelchair?: Yes ?Type of Wheelchair: Manual ?  ? ?Wheelchair assist level:  Supervision/Verbal cueing ?Max wheelchair distance: 30f  ? ? ?Wheelchair 50 feet with 2 turns activity ? ? ? ?Assist ? ?  ?  ? ? ?Assist Level: Supervision/Verbal cueing  ? ?Wheelchair 150 feet activity  ? ? ? ?Assist ?   ? ? ?Assist Level: Maximal Assistance - Patient 25 - 49%  ? ?Blood pressure 132/70, pulse 77, temperature 98.1 ?F (36.7 ?C), temperature source Oral, resp. rate 18, height 6' (1.829 m), weight 96.7 kg, SpO2 98 %. ? ?Medical Problem List and Plan: ?1. 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            -patient may shower but incision must be covered.  ?            -ELOS/Goals: 4/7 modI ?      -Continue CIR therapies including PT, OT  ?  ?2.  Antithrombotics: ?-DVT/anticoagulation:  Pharmaceutical: Heparin ?            -antiplatelet therapy: Aspirin 81 mg daily ?3. Phantom limb pain:  ?-decreased gabapentin to '300mg'$  at bedtime.  ?    -pain controlled ?4. Insomnia: dc elavil d/t AMS, gabapentin decreased as above ? -continue melatonin '5mg'$  qhs to assist sleep ? -will also have trazodone '50mg'$  qhs prn available ?            -antipsychotic agents: N/A ?5. Neuropsych: This patient is capable of making decisions on his own behalf. ?6. Skin/Wound Care: Routine skin checks ?7. Fluids/Electrolytes/Nutrition: Routine in and outs with follow-up chemistries ?8.  Acute blood loss anemia.   ?  -hgb stable at 9.3 4/1---f/u 4/5 ?9.  NSVT/CAD.  Follow cardiology service.  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  Continue Lopressor 25 mg every 8 hours and considering consolidating to Toprol on discharge.  Follow-up outpatient ?10.  Coffee-ground emesis x1 3/14.  H&H stable.  Plan outpatient follow-up GI services as needed.  Continue Protonix twice daily. ? -asymptomatic ?11.  History of BPH/urinary retention.      ? continue foley today   ?-added finasteride to regimen although probably will not have acute effect ?-increasd hytrin to '15mg'$   4/3 ?-rx'ed  UTI ?-bleeding resolved ?4/4-since he didn't sleep well, I'll keep foley in today---dc in AM ?-urology consult as outpt was discussed.  ?12.  Hyperlipidemia.  Crestor ?13.  Hypertension.  Norvasc 10 mg daily, Avapro 300 mg daily.  Monitor with increased mobility ? 4/4 bp controlled ?14.  CLL.  Follow-up outpatient Dr. SAlen Blew  Continue IMBRUVICA 420 mg daily. PATIENT CAN BRING IN OWN SUPPLY IF NEEDED. ?15.  Constipation.  Colace 100 mg twice daily ? -moving bowels ?16.  Hyperglycemia.  Hemoglobin A1c 6.7.  SSI.  Patient with no  documented history of diabetes. ?17. Leukocytosis: likely d/t uti see below ?18. Transaminitis: stopped tylenol. Repeat CMP 4/5 ?19. Residual limb pain: continue Robaxin 500 mg 3 times daily, oxycodone as needed. Add vitamin C '1000mg'$  daily.  ?20. Hyponatremia ? -may be related to CLL vs meds ? -improved, monitor weekly--check cmet 4/5 ?21. UTI:  UC with >100k pseudomonas ? -keflex changed to levaquin for at least 5 days from 3/30 ? -recheck cbc 4/5 ?  ? ?  ? ?LOS: ?12 days ?A FACE TO FACE EVALUATION WAS PERFORMED ? ?Douglas [name] ?07/13/2021, 11:45 AM  ? ?   ?

## 2021-07-13 NOTE — Progress Notes (Signed)
Occupational Therapy Session Note ? ?Patient Details  ?Name: Douglas [name] ?MRN: [id number] ?Date of Birth: 1950/06/29 ? ?Today's Date: 07/13/2021 ?OT Individual Time: 3300-7622 & 6333-5456 ?OT Individual Time Calculation (min): 72 min & 53 min ? ? ?Short Term Goals: ?Week 2:  OT Short Term Goal 1 (Week 2): STG = LTG due to ELOS ? ?Skilled Therapeutic Interventions/Progress Updates:  ?Session 1 ?Skilled OT intervention completed with focus on family education with wife and nurse present. Pt received upright in bed, agreeable to session. No c/o pain. Pt required increased time for bed mobility with pt reliant on bed rail for assist but able to transition with supervision. Completed UB bathing and dressing with supervision, then reported feeling the urge to have BM. Pt requested to lay back down for bed pan, however re-education provided about OOB toileting per plan discussed last family ed session, with pt begrudgingly agreeable. Total A needed for board placement, with slideboard transfer with supervision to DABBSC. Pt was able to push up with BUE and LLE for therapist to doff brief with total A, then pt was continent of large/loose BM. Pt required increased time for BM. Education provided to pt's wife about type of incontinence brief/pull up options, threading while seated, lateral leans/push up method. Pt was able to complete posterior pericare with supervision/cues, then mod A needed for threading new brief. Pt stated "I feel nauseous" with request to lay down, so slideboard transfer >L with total A for board and CGA for sliding to EOB. Pt able to complete bed mobility with min A for LLE management. Nurse in room to provide meds.  ? ?Due to pt's wife being overwhelmed and slightly agitated behavior, extensive time was spent educating pt and pt's wife about OT/PT roles, HHOT/PT purpose vs nursing care, purpose of family education with therapist regarding transfers vs medical wound care and foley care. Nursing  reported, despite having completed family ed the other day that they could schedule another session to increase pt's wife's comfort, however it was ultimately stated by wife "I am not taking care of that, I refuse, you need to figure out how to get someone in to take care of him." This therapist reiterated that pt's wife's discomfort is only with medical nursing care vs physical/self-care with pt as if that was the case with pt's CLOF of at least min A for self-care then we need to make a game plan about preparing for d/c, in which case the wife stated "I can supervise him and help with the toileting, but not the other." ? ?Pt was left upright in bed, with bed alarm on and all needs in reach at end of session. ? ?Session 2 ?Skilled OT intervention completed with focus on BP management, BUE strengthening. Pt received supine in bed, stating "did you read my PT's note about my dizziness?" This therapist had spoken with his previous PT prior with report of low BP, with request to assess orthostatics. First part of session focused on determining orthostatics vs general weakness/behavior for avoiding therapy. ? ?Vitals as follows during session: ?Supine: 105/56 BP, 80 pulse, 98% SPO2 ?In bed with HOB at 90 degrees: 107/79 BP, 85 pulse, 98% SPO2 ?Seated EOB witchout back support: 100/51 BP, 95 pulse, 98% SPO2 ?After sitting 3 mins EOB: 100/51 BP, 90 pulse, 98% SPO2 ?Supine: 102/56 BP ? ?Checked in with his nurse with nurse reporting that the following numbers have been close to pt's trend. Pt returned to bed per request 2/2 fatigue. Agreeable to bed  level exercises including the following to promote BUE strengthening needed for functional endurance: ? ?(With red theraband anchored on bed rail) 10 reps each arm ?Horizontal abduction/adduction ?Shoulder flexion ?Shoulder extension ?Bicep flexion ?Tricep extension ?Chest presses ?Shoulder external/internal rotation ? ?Multimodal cues needed for form and technique however  improved strength this session with upgrade of level band from yellow to red. Pt was left upright in bed, with bed alarm on and all needs in reach at end of session. ? ? 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 ? ? ? ?Therapy/Group: Individual Therapy ? ?Douglas [name] ?07/13/2021, 7:29 AM ?

## 2021-07-13 NOTE — Progress Notes (Signed)
[facility] ? ?Subjective  - Still having pain in the right BKA ? ? ?Objective ?132/70 ?77 ?98.1 ?F (36.7 ?C) (Oral) ?18 ?98% ? ?Intake/Output Summary (Last 24 hours) at 07/13/2021 0747 ?Last data filed at 07/13/2021 0500 ?Gross per 24 hour  ?Intake 120 ml  ?Output 1900 ml  ?Net -1780 ml  ? ? ? ? ? ? ? ?Assessment/Planning: ?Failed bypass s/p right BKA ? ?Will continue dressing as needed for medial incision SS drainage ?Will check on him at the end of the week.  ? ?Roxy Horseman ?07/13/2021 ?7:47 AM ?-- ? ?Laboratory ?Lab Results: ?No results for input(s): WBC, HGB, HCT, PLT in the last 72 hours. ?BMET ?No results for input(s): NA, K, CL, CO2, GLUCOSE, BUN, CREATININE, CALCIUM in the last 72 hours. ? ?COAG ?Lab Results  ?Component Value Date  ? INR 1.0 06/18/2021  ? INR 1.0 01/16/2021  ? ?No results found for: PTT ? ? ? ?

## 2021-07-13 NOTE — Discharge Summary (Signed)
Physician Discharge Summary  ?Patient ID: ?Douglas [name] ?MRN: [id number] ?DOB/AGE: October 28, 1950 71 y.o. ? ?Admit date: 07/01/2021 ?Discharge date: 07/19/2021 ? ?Discharge Diagnoses:  ?Principal Problem: ?  Right below-knee amputee ([place]) ?Active Problems: ?  PAD (peripheral artery disease) ([place]) ?  Post-op pain ?  Urine retention ?Acute blood loss anemia ?NSVT/CAD ?Coffee-ground emesis x1 ?BPH ?Hyperlipidemia ?Hypertension ?CLL ?Constipation ?Transaminitis ?History of tobacco use ? ?Discharged Condition: Stable ? ?Significant Diagnostic Studies: ?DG Abd 1 View ? ?Result Date: 06/22/2021 ?CLINICAL DATA:  Abdominal pain and distension. EXAM: ABDOMEN - 1 VIEW COMPARISON:  None. FINDINGS: There is diffuse gaseous distension of the gastric lumen. No dilated loops of large or small bowel. IMPRESSION: Diffuse gaseous distension of the gastric lumen. Electronically Signed   By: Kerby Moors M.D.   On: 06/22/2021 06:04  ? ?PERIPHERAL VASCULAR CATHETERIZATION ? ?Result Date: 06/21/2021 ?Images from the original result were not included. Patient name: Douglas [name] MRN: [id number] DOB: 1950-06-22 Sex: male 06/21/2021 Pre-operative Diagnosis: Critical right lower extremity ischemia with rest pain Post-operative diagnosis:  Same Surgeon:  Eda Paschal. Donzetta Matters, MD Procedure Performed: 1.  Ultrasound-guided cannulation left common femoral artery 2.  Aortogram 3.  Selection of right common femoral artery and right lower extremity angiogram 4.  Left lower extremity angiogram 5.  Moderate sedation with fentanyl and Versed for 27 minutes Indications: 71 year old male presented with right foot pain for 2 weeks.  He also has 2 to 3 months of claudication now with discoloration of the right foot and severely diminished ABIs on the right with mildly depressed ABIs on the left.  He is now indicated for angiography with possible intervention. Findings: The aorta and iliac segments are free of flow-limiting stenosis.  Bilateral renal arteries are  patent.  Right lower extremity SFA occludes at the the takeoff there is no stump.  He reconstitutes above the knee where it is diseased and below the knee is healthier.  He has runoff dominant via the posterior tibial artery but this gives out at the level of the foot.  On the left side he has normal SFA proximally this occludes for approximately 12 cm in the distal SFA at the adductor but reconstitutes and has three-vessel runoff to the left foot. Patient will need right common femoral to below-knee popliteal artery bypass with tibial thrombectomy.  He remains at high risk for major amputation.  Procedure:  The patient was identified in the holding area and taken to room 8.  The patient was then placed supine on the table and prepped and draped in the usual sterile fashion.  A time out was called.  Ultrasound was used to evaluate the left common femoral artery.  There is no spasm percent lidocaine cannulated with direct ultrasound visualization followed by micropuncture wire and sheath.  And images saved the permanent record.  Bentson wires placed followed by 5 [place] sheath.  Omni catheter was placed to the level of L1 aortogram was performed.  We crossed the bifurcation of the right common femoral artery using Omni catheter and Bentson wire.  We then performed right lower extremity angiography with above findings.  We then remove the catheter over wire left lower extremity angiography was performed through the sheath angiogram.  With the above findings we plan for right common femoral to below-knee popliteal artery bypass and likely will need thrombectomy for limb salvage.  This will be planned tomorrow in the OR.  Sheath to be pulled in postoperative holding.  He tolerated procedure without any complication. Contrast:  [place] Donzetta Matters, MD [facility] Office: [phone] Pager: [phone]  ? ?DG Foot 2 Views Right ? ?Result Date: 06/24/2021 ?Please see detailed radiograph  report in office note. ? ?VAS [place] ABI WITH/WO TBI ? ?Result Date: 06/18/2021 ? LOWER EXTREMITY DOPPLER STUDY Patient Name:  Douglas [name]  Date of Exam:   06/18/2021 Medical Rec #: [id number]         Accession #:    [id number] Date of Birth: 05/10/1950          Patient Gender: M Patient Age:   59 years Exam [facility]  [facility] Procedure:      VAS [place] ABI WITH/WO TBI Referring Phys: Herbie Baltimore LOCKWOOD --------------------------------------------------------------------------------  Indications: Claudication, rest pain, and peripheral artery disease. High Risk Factors: Hypertension, Diabetes.  Comparison Study: No prior studies. Performing Technologist: Carlos Levering RVT  Examination Guidelines: A complete evaluation includes at minimum, Doppler waveform signals and systolic blood pressure reading at the level of bilateral brachial, anterior tibial, and posterior tibial arteries, when vessel segments are accessible. Bilateral testing is considered an integral part of a complete examination. Photoelectric Plethysmograph (PPG) waveforms and toe systolic pressure readings are included as required and additional duplex testing as needed. Limited examinations for reoccurring indications may be performed as noted.  ABI Findings: +---------+------------------+-----+----------+--------+ Right    Rt Pressure (mmHg)IndexWaveform  Comment  +---------+------------------+-----+----------+--------+ Brachial 160                    triphasic          +---------+------------------+-----+----------+--------+ PTA      35                0.22 monophasic         +---------+------------------+-----+----------+--------+ DP       53                0.33 monophasic         +---------+------------------+-----+----------+--------+ Great Toe0                 0.00                    +---------+------------------+-----+----------+--------+ +---------+------------------+-----+---------+-------+ Left     Lt Pressure  (mmHg)IndexWaveform Comment +---------+------------------+-----+---------+-------+ Brachial 153                    triphasic        +---------+------------------+-----+---------+-------+ PTA      118               0.74 biphasic         +---------+------------------+-----+---------+-------+ DP       114               0.71 biphasic         +---------+------------------+-----+---------+-------+ Great Toe52                0.32                  +---------+------------------+-----+---------+-------+ +-------+-----------+-----------+------------+------------+ ABI/TBIToday's ABIToday's TBIPrevious ABIPrevious TBI +-------+-----------+-----------+------------+------------+ Right  0.33       0                                   +-------+-----------+-----------+------------+------------+ Left   0.74       0.33                                +-------+-----------+-----------+------------+------------+  Summary: Right: Resting right ankle-brachial index indicates severe right lower extremity arterial disease. The right toe-brachial index is abnormal. Unable to obtain TBI due to low amplitude waveforms. Left: Resting left ankle-brachial index indicates moderate left lower extremity arterial disease. The left toe-brachial index is abnormal.  *See table(s) above for measurements and observations.  Electronically signed by Monica Martinez MD on 06/18/2021 at 5:14:25 PM.    Final   ? ?VAS [place] LOWER EXTREMITY SAPHENOUS VEIN MAPPING ? ?Result Date: 06/21/2021 ?[place] Patient Name:  Douglas [name]  Date of Exam:   06/21/2021 Medical Rec #: [id number]         Accession #:    [id number] Date of Birth: Dec 04, 1950          Patient Gender: M Patient Age:   79 years Exam Location[facility]  [facility] Procedure:      VAS [place] LOWER [place] Referring Phys: Servando Snare --------------------------------------------------------------------------------   Indications: PAD  Comparison Study: NO PRIOR Performing Technologist: Archie Patten RVS  Examination Guidelines: A complete evaluation includes B-mode imaging, spectral Doppler, color Doppler, and power Doppler as needed of all accessible portions of each vessel. Bilateral testing is consid

## 2021-07-13 NOTE — Progress Notes (Signed)
Physical Therapy Session Note ? ?Patient Details  ?Name: Douglas [name] ?MRN: [id number] ?Date of Birth: 07/08/1950 ? ?Today's Date: 07/13/2021 ?PT Individual Time: 8315-1761 ?PT Individual Time Calculation (min): 56 min  ? ?Short Term Goals: ?Week 1:  PT Short Term Goal 1 (Week 1): Pt will perform sit to stand from wc w/mod assist of 1 ?PT Short Term Goal 1 - Progress (Week 1): Not met ?PT Short Term Goal 2 (Week 1): pt will transfer bed to/from wc w/LRAD and mod assist ?PT Short Term Goal 2 - Progress (Week 1): Met ?PT Short Term Goal 3 (Week 1): Pt will propel wc 144f w/additional time ?PT Short Term Goal 3 - Progress (Week 1): Progressing toward goal ?PT Short Term Goal 4 (Week 1): Pt will tolerate gait assessment w/LRAD ?PT Short Term Goal 4 - Progress (Week 1): Not met ?Week 2:  PT Short Term Goal 1 (Week 2): STG=LTG due to LOS ? ?Skilled Therapeutic Interventions/Progress Updates:  ? Received pt semi-reclined in bed, pt agreeable to PT treatment, and reported pain 1-2/10 in R residual limb. Session with emphasis on functional mobility/transfers, generalized strengthening, dynamic sitting balance/coordination, and improved activity tolerance. Donned pants in supine with total A to thread catheter though but able to bridge hips and pull pants over hips with min A.  Pt transferred supine<>sitting EOB with heavy reliance on bedrails with close supervision and donned L shoe with max A. Pt then transferred bed<>WC via slideboard with close supervision and total A to place board. [facility] then instructed in [id number] BUE and supervision around cone with emphasis on navigating tight turns (L and R) to simulate home environment. Pt then performed 2x12 R knee extensions - educated pt on importance of knee extension for contracture prevention and pt verbalized understanding. Pt transferred on/off Nustep via slideboard with CGA and total A to place board. Pt performed BUE  and LLE strengthening on Nustep at workload 4 for a total of 90 steps for 4 minutes with emphasis on cardiovascular endurance, UE, and LLE strengthening - R residual limb supported on pillow on chair in extension. Pt then stopped and reported being "done with this" due to fatigue. Therapist enocuraged pt to contiue for another minute but pt declined. Sitting in [place] reported feeling sudden dizziness - BP: 102/44 on trial 1 and 77/59 on trial 2 with pt reporting no relief in symptoms. Quickly transported pt back to room and transferred WC<>bed via slideboard with CGA, doffed L shoe with max A, and transferred sit<>supine with supervision and doffed pants with supervision. Re-checked vitals in supine 144/124 on trial 1 and 115/51 on trial 2 with pt reporting symptom relief. Concluded session with pt semi-reclined in bed, needs within reach, and bed alarm on. RN and primary OT notified.  ? 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 ?Therapy/Group: Individual Therapy ?AnBlenda NicelyAnBecky SaxT, DPT  ?07/13/2021, 7:08 AM  ?

## 2021-07-13 NOTE — Progress Notes (Signed)
Patient ID: Douglas [name], male   DOB: [birth year], 71 y.o.   MRN: [id number] ? ?Wheelchair, Amputee pad, drop arm bedside commode and transfer board ordered through [employer]  ?

## 2021-07-14 DIAGNOSIS — G8918 Other acute postprocedural pain: Secondary | ICD-10-CM | POA: Diagnosis not present

## 2021-07-14 DIAGNOSIS — N390 Urinary tract infection, site not specified: Secondary | ICD-10-CM | POA: Diagnosis not present

## 2021-07-14 DIAGNOSIS — I739 Peripheral vascular disease, unspecified: Secondary | ICD-10-CM | POA: Diagnosis not present

## 2021-07-14 DIAGNOSIS — Z89511 Acquired absence of right leg below knee: Secondary | ICD-10-CM | POA: Diagnosis not present

## 2021-07-14 LAB — CBC
HCT: 32.8 % — ABNORMAL LOW (ref 39.0–52.0)
Hemoglobin: 10.5 g/dL — ABNORMAL LOW (ref 13.0–17.0)
MCH: 28.8 pg (ref 26.0–34.0)
MCHC: 32 g/dL (ref 30.0–36.0)
MCV: 89.9 fL (ref 80.0–100.0)
Platelets: 612 10*3/uL — ABNORMAL HIGH (ref 150–400)
RBC: 3.65 MIL/uL — ABNORMAL LOW (ref 4.22–5.81)
RDW: 16.1 % — ABNORMAL HIGH (ref 11.5–15.5)
WBC: 10.4 10*3/uL (ref 4.0–10.5)
nRBC: 0 % (ref 0.0–0.2)

## 2021-07-14 LAB — COMPREHENSIVE METABOLIC PANEL
ALT: 89 U/L — ABNORMAL HIGH (ref 0–44)
AST: 28 U/L (ref 15–41)
Albumin: 2.2 g/dL — ABNORMAL LOW (ref 3.5–5.0)
Alkaline Phosphatase: 214 U/L — ABNORMAL HIGH (ref 38–126)
Anion gap: 7 (ref 5–15)
BUN: 19 mg/dL (ref 8–23)
CO2: 26 mmol/L (ref 22–32)
Calcium: 8.5 mg/dL — ABNORMAL LOW (ref 8.9–10.3)
Chloride: 97 mmol/L — ABNORMAL LOW (ref 98–111)
Creatinine, Ser: 0.64 mg/dL (ref 0.61–1.24)
GFR, Estimated: >60 mL/min (ref >60)
Glucose, Bld: 145 mg/dL — ABNORMAL HIGH (ref 70–99)
Potassium: 4.5 mmol/L (ref 3.5–5.1)
Sodium: 130 mmol/L — ABNORMAL LOW (ref 135–145)
Total Bilirubin: 0.4 mg/dL (ref 0.3–1.2)
Total Protein: 5.1 g/dL — ABNORMAL LOW (ref 6.5–8.1)

## 2021-07-14 LAB — GLUCOSE, CAPILLARY
Glucose-Capillary: 135 mg/dL — ABNORMAL HIGH (ref 70–99)
Glucose-Capillary: 153 mg/dL — ABNORMAL HIGH (ref 70–99)
Glucose-Capillary: 172 mg/dL — ABNORMAL HIGH (ref 70–99)
Glucose-Capillary: 160 mg/dL — ABNORMAL HIGH (ref 70–99)

## 2021-07-14 MED ORDER — ALUM & MAG HYDROXIDE-SIMETH 200-200-20 MG/5ML PO SUSP
30.0000 mL | Freq: Four times a day (QID) | ORAL | Status: DC | PRN
Start: 2021-07-14 — End: 2021-07-19
  Administered 2021-07-14: 30 mL via ORAL
  Filled 2021-07-14: qty 30

## 2021-07-14 NOTE — Progress Notes (Signed)
Patient ID: Douglas [name], male   DOB: June 20, 1950, 71 y.o.   MRN: [id number] ?Team Conference Report to Patient/Family ? ?Team Conference discussion was reviewed with the patient and caregiver, including goals, any changes in plan of care and target discharge date.  Patient and caregiver express understanding and are in agreement.  The patient has a target discharge date of 07/16/21. ? ?SW met with patient and provided conference updates. SW informed patient or spouse do not want to assist with wound care. SW arranged HH/RN/Pt/Ot. Patient will require medical transportation for d/c.  ? ?Dyanne Iha ?07/14/2021, 1:45 PM  ?

## 2021-07-14 NOTE — Plan of Care (Signed)
Foley for discharge; wife declined education for management, [facility] nurse referral ?Dressing change to residual limb; patient unable to complete, wife requested [place] ?

## 2021-07-14 NOTE — Progress Notes (Signed)
?                                                       PROGRESS NOTE ? ? ?Subjective/Complaints: ?Pt slept better last night. Didn't need trazodone. Feels well this morning.  ? ?ROS: Patient denies fever, rash, sore throat, blurred vision, dizziness, nausea, vomiting, diarrhea, cough, shortness of breath or chest pain, neck pain, headache, or mood change.  ? ?Objective: ?  ?No results found. ?Recent Labs  ?  07/14/21 ?3976  ?WBC 10.4  ?HGB 10.5*  ?HCT 32.8*  ?PLT 612*  ? ? ?Recent Labs  ?  07/14/21 ?7341  ?NA 130*  ?K 4.5  ?CL 97*  ?CO2 26  ?GLUCOSE 145*  ?BUN 19  ?CREATININE 0.64  ?CALCIUM 8.5*  ? ? ? ?Intake/Output Summary (Last 24 hours) at 07/14/2021 0924 ?Last data filed at 07/14/2021 0700 ?Gross per 24 hour  ?Intake 700 ml  ?Output 825 ml  ?Net -125 ml  ?  ? ?  ? ?Physical Exam: ?Vital Signs ?Blood pressure 127/62, pulse 74, temperature 98 ?F (36.7 ?C), resp. rate 18, height 6' (1.829 m), weight 96.7 kg, SpO2 97 %. ? ?Constitutional: No distress . Vital signs reviewed. ?HEENT: NCAT, EOMI, oral membranes moist ?Neck: supple ?Cardiovascular: RRR without murmur. No JVD    ?Respiratory/Chest: CTA Bilaterally without wheezes or rales. Normal effort    ?GI/Abdomen: BS +, non-tender, non-distended ?Ext: no clubbing, cyanosis, or edema ?Psych: pleasant and cooperative  ?Skin: right stump with eschar mild bleeding from incision --stable ? ?Neuro:  Alert and oriented x 3. Normal insight and awareness. Intact Memory. Normal language and speech. Cranial nerve exam unremarkable. Moves all 4's, right leg limted somewhat by pain. Does appear more confused.  ?Musculoskeletal: right stump sensitive, shrinker in place ?GU: foley in place with clear, clear yellow urine ? ? ?Assessment/Plan: ?1. Functional deficits which require 3+ hours per day of interdisciplinary therapy in a comprehensive inpatient rehab setting. ?Physiatrist is providing close team supervision and 24 hour management of active medical problems listed  below. ?Physiatrist and rehab team continue to assess barriers to discharge/monitor patient progress toward functional and medical goals ? ?Care Tool: ? ?Bathing ?   ?Body parts bathed by patient: Right arm, Left arm, Chest, Abdomen, Right upper leg, Left upper leg, Right lower leg, Left lower leg, Face  ? Body parts bathed by helper: Buttocks ?Body parts n/a: Right lower leg ?  ?Bathing assist Assist Level: Supervision/Verbal cueing ?  ?  ?Upper Body Dressing/Undressing ?Upper body dressing   ?What is the patient wearing?: Pull over shirt ?   ?Upper body assist Assist Level: Set up assist ?   ?Lower Body Dressing/Undressing ?Lower body dressing ? ? ?   ?What is the patient wearing?: Incontinence brief ? ?  ? ?Lower body assist Assist for lower body dressing: Moderate Assistance - Patient 50 - 74% ?   ? ?Toileting ?Toileting Toileting Activity did not occur (Probation officer and hygiene only): N/A (no void or bm)  ?Toileting assist Assist for toileting: Minimal Assistance - Patient > 75% ?  ?  ?Transfers ?Chair/bed transfer ? ?Transfers assist ?   ? ?Chair/bed transfer assist level: Supervision/Verbal cueing (slideboard) ?  ?  ?Locomotion ?Ambulation ? ? ?Ambulation assist ? ? Ambulation activity did not occur: Safety/medical concerns (max assist to  stand, unable to achieve full upright w/RW) ? ?  ?  ?   ? ?Walk 10 feet activity ? ? ?Assist ? Walk 10 feet activity did not occur: Safety/medical concerns ? ?  ?   ? ?Walk 50 feet activity ? ? ?Assist Walk 50 feet with 2 turns activity did not occur: Safety/medical concerns ? ?  ?   ? ? ?Walk 150 feet activity ? ? ?Assist Walk 150 feet activity did not occur: Safety/medical concerns ? ?  ?  ?  ? ?Walk 10 feet on uneven surface  ?activity ? ? ?Assist Walk 10 feet on uneven surfaces activity did not occur: Safety/medical concerns ? ? ?  ?   ? ?Wheelchair ? ? ? ? ?Assist Is the patient using a wheelchair?: Yes ?Type of Wheelchair: Manual ?  ? ?Wheelchair assist level:  Supervision/Verbal cueing ?Max wheelchair distance: 51f  ? ? ?Wheelchair 50 feet with 2 turns activity ? ? ? ?Assist ? ?  ?  ? ? ?Assist Level: Supervision/Verbal cueing  ? ?Wheelchair 150 feet activity  ? ? ? ?Assist ?   ? ? ?Assist Level: Maximal Assistance - Patient 25 - 49%  ? ?Blood pressure 127/62, pulse 74, temperature 98 ?F (36.7 ?C), resp. rate 18, height 6' (1.829 m), weight 96.7 kg, SpO2 97 %. ? ?Medical Problem List and Plan: ?1. 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            -patient may shower but incision must be covered.  ?            -ELOS/Goals: 4/7 modI ?        -Continue CIR therapies including PT and OT. Interdisciplinary team conference today to discuss goals, barriers to discharge, and dc planning.  ?  ?2.  Antithrombotics: ?-DVT/anticoagulation:  Pharmaceutical: Heparin ?            -antiplatelet therapy: Aspirin 81 mg daily ?3. Phantom limb pain:  ?-decreased gabapentin to '300mg'$  at bedtime.  ?    -pain controlled ?4. Insomnia: dc elavil d/t AMS, gabapentin decreased as above ? -continue melatonin '5mg'$  qhs to assist sleep ? -  trazodone '50mg'$  qhs prn available ?            -antipsychotic agents: N/A ?5. Neuropsych: This patient is capable of making decisions on his own behalf. ?6. Skin/Wound Care: Routine skin checks ?7. Fluids/Electrolytes/Nutrition: Routine in and outs with follow-up chemistries ?8.  Acute blood loss anemia.   ?  -hgb stable at 9.3 4/1---f/u 4/5--> 10.5 ?9.  NSVT/CAD.  Follow cardiology service.  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  Continue Lopressor 25 mg every 8 hours and considering consolidating to Toprol on discharge.  Follow-up outpatient ?10.  Coffee-ground emesis x1 3/14.  H&H stable.  Plan outpatient follow-up GI services as needed.  Continue Protonix twice daily. ? -asymptomatic ?11.  History of BPH/urinary retention.      ? continue foley today   ?-added finasteride to regimen although probably  will not have acute effect ?-increasd hytrin to '15mg'$   4/3 ?-rx'ed UTI ?-bleeding resolved ?4/5--dc foley and attempt voiding trial prior to dc.  ?-urology consult as outpt   ?12.  Hyperlipidemia.  Crestor ?13.  Hypertension.  Norvasc 10 mg daily, Avapro 300 mg daily.  Monitor with increased mobility ? 4/5 bp controlled ?14.  CLL.  Follow-up outpatient Dr. SAlen Blew  Continue IMBRUVICA 420 mg daily. PATIENT CAN BRING IN OWN SUPPLY IF  NEEDED. ?15.  Constipation.  Colace 100 mg twice daily ? -moving bowels ?16.  Hyperglycemia.  Hemoglobin A1c 6.7.  SSI.  Patient with no documented history of diabetes. ?17. Leukocytosis: likely d/t uti see below ?18. Transaminitis: stopped tylenol. Repeat CMP 4/5 ?19. Residual limb pain: continue Robaxin 500 mg 3 times daily, oxycodone as needed. Add vitamin C '1000mg'$  daily.  ?20. Hyponatremia ? -may be related to CLL vs meds ? -trending up. 130 4/5. ?21. UTI:  UC with >100k pseudomonas ? -keflex changed to levaquin for at least 5 days from 3/30 ? -wbc's down to 10.4 4/5 ?  ? ?  ? ?LOS: ?13 days ?A FACE TO FACE EVALUATION WAS PERFORMED ? ?Meredith Staggers ?07/14/2021, 9:24 AM  ? ?   ?

## 2021-07-14 NOTE — Patient Care Conference (Signed)
Inpatient RehabilitationTeam Conference and Plan of Care Update ?Date: 07/14/2021   Time: 11:42 AM  ? ? ?Patient Name: Douglas [name]      ?Medical Record Number: [id number]  ?Date of Birth: 15-May-1950 ?Sex: Male         ?Room/Bed: 2H06C/3J62G-31 ?Payor Info: Payor: Theme park manager / Plan: Theme park manager OTHER / Product Type: *No Product type* /   ? ?Admit Date/Time:  07/01/2021  4:22 PM ? ?Primary Diagnosis:  Right below-knee amputee ([place]) ? ?Hospital Problems: Principal Problem: ?  Right below-knee amputee [facility]) ?Active Problems: ?  PAD (peripheral artery disease) ([place]) ?  Post-op pain ?  Urine retention ? ? ? ?Expected Discharge Date: Expected Discharge Date: 07/16/21 ? ?Team Members Present: ?Physician leading conference: Dr. Alysia Penna ?Social Worker Present: Erlene Quan, BSW ?Nurse Present: Dorien Chihuahua, RN ?PT Present: Becky Sax, PT ?OT Present: Other (comment) [facility] Alphonsa Gin, [place]) ?SLP Present: Other (comment) Helaine Chess, SLP) ?PPS Coordinator present : Gunnar Fusi, SLP ? ?   Current Status/Progress Goal Weekly Team Focus  ?Bowel/Bladder ? ?   Foley removed; voiding trial   Continent of bowel and bladder   Monitor voiding  ?Swallow/Nutrition/ Hydration ? ?           ?ADL's ? ? supervision bathing, Min A LB dressing, Mod A toileting (very reluctant to use BSC due to incontinence but was able to use it with OT as of 4/4), slideboard toilet transfers with supervision/CGA  supervision  safety awareness, lateral leans for OOB toileting, functional endurance   ?Mobility ? ? bed mobility supervision (increased time and effort), slideboard transfers close supervision/CGA, WC mobility 26f supervision. D/C'd standing/gait goals due to pt refusal  mod I bed mobility, supervision/min A transfers, supervision WC mobility  family education with wife, functional mobility/transfers, D/C planning, equipment management, generalized strengthening and endurance, and amputee education    ?Communication ? ?           ?Safety/Cognition/ Behavioral Observations ?           ?Pain ? ?   Phantom pain managed with prn meds   Pain at or below level 3 with prns   Monitor need for and effectiveness of medications  ?Skin ? ?   Wound on residual limb/stump shrinker/limb guard   Wound healing   Skin care/dressing changes as ordered  ? ? ?Discharge Planning:  ?discharging home with spouse   ?Team Discussion: ?Patient is very self limiting and wife requesting [employer] to address incision/wound care. ? ?Patient on target to meet rehab goals: ?Currently needs supervision for wheelchair transfersa nd bathing. Needs min assist for lower body dressing and toileting. Goals for discharge set for supervision overall. ? ?*See Care Plan and progress notes for long and short-term goals.  ? ?Revisions to Treatment Plan:  ?Prosthesis consult ?Downgraded OT goals ?  ?Teaching Needs: ?Safety, skin care, medication management, transfers, etc  ?Current Barriers to Discharge: ?Decreased caregiver support and Behavior ? ?Possible Resolutions to Barriers: ?Family education ?HH follow up services ?DME: BA-DA-BSC, SB, w/c with amputee support pad ?  ? ? Medical Summary ?Current Status: right bka healing, tolerating shrinker. hematuria resolved after foley. will attempt another voiding trial today, hgb stable, MS better with reduction in meds ? Barriers to Discharge: Medical stability ?  ?Possible Resolutions to [place] voiding trial, may need to reinsert foley at discharge. pain control, wound care ? ? ?Continued Need for Acute Rehabilitation Level of Care: The patient requires daily medical management by a  physician with specialized training in physical medicine and rehabilitation for the following reasons: ?Direction of a multidisciplinary physical rehabilitation program to maximize functional independence : Yes ?Medical management of patient stability for increased activity during participation in an intensive  rehabilitation regime.: Yes ?Analysis of laboratory values and/or radiology reports with any subsequent need for medication adjustment and/or medical intervention. : Yes ? ? ?I attest that I was present, lead the team conference, and concur with the assessment and plan of the team. ? ? ?Dorien Chihuahua B ?07/14/2021, 4:19 PM  ? ? ? ? ? ? ?

## 2021-07-14 NOTE — Progress Notes (Signed)
Physical Therapy Session Note ? ?Patient Details  ?Name: Douglas [name] ?MRN: [id number] ?Date of Birth: 1950-07-04 ? ?Today's Date: 07/14/2021 ?PT Individual Time: 2620-3559 ?PT Individual Time Calculation (min): 70 min  ? ?Short Term Goals: ?Week 1:  PT Short Term Goal 1 (Week 1): Pt will perform sit to stand from wc w/mod assist of 1 ?PT Short Term Goal 1 - Progress (Week 1): Not met ?PT Short Term Goal 2 (Week 1): pt will transfer bed to/from wc w/LRAD and mod assist ?PT Short Term Goal 2 - Progress (Week 1): Met ?PT Short Term Goal 3 (Week 1): Pt will propel wc 162f w/additional time ?PT Short Term Goal 3 - Progress (Week 1): Progressing toward goal ?PT Short Term Goal 4 (Week 1): Pt will tolerate gait assessment w/LRAD ?PT Short Term Goal 4 - Progress (Week 1): Not met ?Week 2:  PT Short Term Goal 1 (Week 2): STG=LTG due to LOS ? ?Skilled Therapeutic Interventions/Progress Updates:  ? Received pt semi-reclined in bed stating "is it already time for therapy?" Pt agreeable to PT treatment and reported pain 1/10 in R residual limb at rest increasing to 4/10 with mobility - RN notified of pt's request for pain medication. Session with emphasis on discharge planning, functional mobility/transfers, toileting, dressing, and generalized strengthening and endurance. Encouraged practicing getting shrinker on/off but pt reports he knows he cannot reach and that his wife will assist. Donned pants in supine with total A to thread catheter through and rolled L/R with supervision and requested therapist's assistance to pull pants over hips. Pt transferred supine<>sitting EOB from flat bed (to simulate home environment) using bedrail with supervision with increased time and effort. Pt reported itchiness along chest - provided pt with warm soapy washcloth to clean and pt reported relief. Pt then reported urge to have BM and transferred bed<>drop arm bedside commode via slideboard with CGA. Performed tricep push up and therapist  doffed brief/pants with max A. Doffed dirty shirt and donned clean one sitting on commode with set up assist. Pt required increased time on commode - continent of bowel (very loose and RN notified) and able to perform peri-care via lateral leans but requested therapist pull pants over hips via tricep push up. Pt then reported feeling "lightheaded" - BP: 102/58. [facility] of [facility] in bed for BP regulation but pt refused and insisted on returning to bed. Of note, unsure if pt is using low BP as reason to avoid full participation in therapy. Bedside commode<>bed via slideboard and close supervision and doffed L shoe with max A. Pt transferred sit<>supine and bridged hips to remove pants. Pt agreeable to bed level theraex and performed the following exercises with emphasis on LE strength: ?-SLR 2x12 bilaterally ?-hip abduction 2x12 bilaterally  ?-hip adduction pillow squeezes 2x15 ?-hip abduction with red TB 2x12 ?-L ankle PF with red TB 2x20 ?Educated pt on importance of daily skin/wound inspections using handheld inspection mirror and encouraged pt to try, however pt declined and did not appear to understand education requesting to "wait until his stump is healed more". Concluded session with pt semi-reclined in bed, needs within reach, and bed alarm on.  ? 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 ?Therapy/Group: Individual Therapy ?ABlenda Nicely?ABecky SaxPT, DPT  ?07/14/2021, 7:16 AM  ?

## 2021-07-14 NOTE — Progress Notes (Signed)
Pt Foley removed per MD order. Patient tolerated removal of foley. Nursing will begin void trial while awake. Patient states he has pain rated 1/10 but unrelated to foley removal. All other needs met at this time, bed in lowest setting, pt left with PT to start session. ?

## 2021-07-14 NOTE — Progress Notes (Signed)
Spoke with patient and wife during OT family education with [place]. Patient wife expressed concern with doing wound care and foley once discharged. Nurse reminded wife about one wound care education session we had and nurse explained what supplies would be needed to do dressing change and that we would order all supplies needed to do at home. Pt wife stated that she is not a Buyer, retail and will not do any wound care or foley care and wants a nurse to come to their home daily to do nursing issues. Pt wife felt overwhelmed and stated she was frustrated that nurse was trying to educate her with things she just can't do, and will not do. Nurse showed wife the book and offered to have a nursing education session to not only have a hands on teaching session but written instructions as well to look back on later if she needs a resource. Pt wife became agitated and stated that it is a waste of time for her and nursing because she will not do it. Nurse expressed concern with patient and wife as patient states his wife will put on his shrinker when nurse encourages patient to do it after wound care. Pt and wife requests for home health nurse and ambulance her forwarded to SW for further assistance.   ?

## 2021-07-14 NOTE — Progress Notes (Signed)
Occupational Therapy Session Note ? ?Patient Details  ?Name: Douglas [name] ?MRN: [id number] ?Date of Birth: [birth year] ? ?Today's Date: 07/14/2021 ?OT Individual Time: 7782-4235 ?OT Individual Time Calculation (min): 55 min  ? ? ?Short Term Goals: ?Week 2:  OT Short Term Goal 1 (Week 2): STG = LTG due to ELOS ? ?Skilled Therapeutic Interventions/Progress Updates:  ?Skilled OT intervention completed with focus on functional transfers, ADL retraining. Pt received supine in bed, agreeable to session. Pt motivated to take a shower today. Therapist verified with nursing about glued incision with nursing stating approval to just avoid being abrasive with soap on those areas and to cover residual limb. Completed bed mobility with supervision, then supervised slideboard transfer from EOB > w/c with use of towel on board due to pt only wearing brief. Pt completed CGA shower slideboard transfer to tub bench, with cues needed for safety as slight incline was present from w/c to the bench. Pt semi-stood with supervision with use of grab bars for therapist to doff brief, then pt doffed shirt with supervision. Therapist wrapped residual limb with 2 bags/tape to prevent water coming in contact with staples/incision. Education provided about this technique for use at home, however advised pt that he likely wouldn't have to cover once home due to pt planning to sponge bathe until in ADA compliant apartment and accessible bathroom.  ? ?Once set up with items, pt was able to manage all bathing with intermittent supervision for seated bathing only, including managing [place] and water temp. Sitting on bench, pt was able to donn shirt with min A 2/2 to wet skin, donn LB brief with mod A at the semi-stand level with use of grab bars with CGA needed for balance with 2 attempts. Completed slideboard transfer from tub bench > w/c with min assist. Pt completed grooming with set up A. Completed slide board transfer at same assist to EOB, however  needed min A for postural balance and safety cues due to fatigue. Supervised bed mobility. Therapist retrieved thin blanket per request, then left pt semi-supine with bed alarm on and all needs in reach at end of session.  ? ? ? 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 ?Pain: ?Unrated pain in residual limb, RN aware, removed pillow for better positioning with improvement reported. ? ? ?Therapy/Group: Individual Therapy ? ?Liticia Gasior E Karolee Meloni ?07/14/2021, 7:33 AM ?

## 2021-07-14 NOTE — Progress Notes (Signed)
Physical Therapy Session Note ? ?Patient Details  ?Name: Douglas [name] ?MRN: [id number] ?Date of Birth: [birth year] ? ?Today's Date: 07/14/2021 ?PT Individual Time: 1000-1055 ?PT Individual Time Calculation (min): 55 min  ? ?Short Term Goals: ?Week 2:  PT Short Term Goal 1 (Week 2): STG=LTG due to LOS ?   ? ?Skilled Therapeutic Interventions/Progress Updates:  ?Pt received lying recumbent in bed and agreeable to therapy after nursing removed foley catheter. Pt reports that he experienced bouts of dizziness and was hypotensive during previous therapy session but symptoms have since resolved and pain is rated 1/10. Pt was min-modA to don briefs in supine, required verbal cuing to roll to sidelying and modA to pull briefs over his hips. Supervision for supine <> sit with increased time to complete. Attempted stand-pivot transfer with RW from bed <> w/c but pt reported unable to complete d/t weakness in his LLE after standing for about 10sec. STS with minA 1x during session and pt did not want to attempt anymore and states that his LE strength will "come back". PT educated pt on benefits of standing which would help significantly strengthen the LLE but pt continued to emphasize that he does not have interest in standing or ambulating with RW upon D/C since he can perform slideboard transfers and/or propel himself in w/c. Slideboard transfer from w/c <> bed with supervision and total A for set up. PT provided pt with techniques to manage phantom limb pain and pt agreed to try in the therapy gym. W/C mobility performed with supervision and BUE propulsion for 2 trials x65f going to/from therapy gym - pt reported fatigue and for PT to transport rest of the way. Pt educated on mirror therapy approach to address phantom limb pain. Performed with mirror between LE's to mimic the residual limb: ? ?2x15 seated LAQs  ?2x15 seated marches ?2x15 seated ankle dorsiflexion ? ?Pt reported needing to lie down on the mat table at the end  of exercises d/t feeling hypotensive, which was not occurring prior to surgery but PT educated pt on sx potentially being d/t changes in medication; pt understood. Sx seemed to subide after 2 min of rest in sidelying on the mat table. Slideboard transfer from mat table <> w/c with supervision to get back to pt's room. BP assessed upon returning to pt room, recumbent in bed: 117/63 - pt reports this is on the lower end for him but no intervention needed. Pt left lying in bed with HOB slightly elevated, call button and needs in reach, bed alarm active. ? ?Therapy Documentation ?Precautions:  ?Precautions ?Precautions: Fall ?Precaution Comments: R BKA 3/17 ?Required Braces or Orthoses: Other Brace ?Other Brace: limb guard ?Restrictions ?Weight Bearing Restrictions: Yes ?RLE Weight Bearing: Non weight bearing  ? ? ? ?Therapy/Group: Individual Therapy ? ?RLanetta InchSPT ?07/14/2021, 1:22 PM  ?

## 2021-07-15 ENCOUNTER — Other Ambulatory Visit (HOSPITAL_COMMUNITY): Payer: Self-pay

## 2021-07-15 DIAGNOSIS — Z89511 Acquired absence of right leg below knee: Secondary | ICD-10-CM | POA: Diagnosis not present

## 2021-07-15 DIAGNOSIS — G8918 Other acute postprocedural pain: Secondary | ICD-10-CM | POA: Diagnosis not present

## 2021-07-15 DIAGNOSIS — N390 Urinary tract infection, site not specified: Secondary | ICD-10-CM | POA: Diagnosis not present

## 2021-07-15 DIAGNOSIS — I739 Peripheral vascular disease, unspecified: Secondary | ICD-10-CM | POA: Diagnosis not present

## 2021-07-15 LAB — GLUCOSE, CAPILLARY
Glucose-Capillary: 134 mg/dL — ABNORMAL HIGH (ref 70–99)
Glucose-Capillary: 163 mg/dL — ABNORMAL HIGH (ref 70–99)
Glucose-Capillary: 155 mg/dL — ABNORMAL HIGH (ref 70–99)
Glucose-Capillary: 154 mg/dL — ABNORMAL HIGH (ref 70–99)
Glucose-Capillary: 167 mg/dL — ABNORMAL HIGH (ref 70–99)

## 2021-07-15 MED ORDER — PANTOPRAZOLE SODIUM 40 MG PO TBEC
40.0000 mg | DELAYED_RELEASE_TABLET | Freq: Two times a day (BID) | ORAL | 0 refills | Status: DC
Start: 2021-07-15 — End: 2021-08-24
  Filled 2021-07-15: qty 60, 30d supply, fill #0

## 2021-07-15 MED ORDER — GABAPENTIN 300 MG PO CAPS
300.0000 mg | ORAL_CAPSULE | Freq: Every day | ORAL | 0 refills | Status: DC
  Filled 2021-07-15: qty 30, 30d supply, fill #0

## 2021-07-15 MED ORDER — METHOCARBAMOL 500 MG PO TABS
500.0000 mg | ORAL_TABLET | Freq: Three times a day (TID) | ORAL | 0 refills | Status: DC
Start: 2021-07-15 — End: 2022-06-02
  Filled 2021-07-15: qty 90, 30d supply, fill #0

## 2021-07-15 MED ORDER — ROSUVASTATIN CALCIUM 20 MG PO TABS
20.0000 mg | ORAL_TABLET | Freq: Every day | ORAL | 0 refills | Status: DC
  Filled 2021-07-15: qty 30, 30d supply, fill #0

## 2021-07-15 MED ORDER — ALBUTEROL SULFATE HFA 108 (90 BASE) MCG/ACT IN AERS
2.0000 | INHALATION_SPRAY | Freq: Four times a day (QID) | RESPIRATORY_TRACT | 0 refills | Status: DC
  Filled 2021-07-15: qty 8.5, 25d supply, fill #0

## 2021-07-15 MED ORDER — IRBESARTAN 300 MG PO TABS
300.0000 mg | ORAL_TABLET | Freq: Every day | ORAL | 0 refills | Status: DC
  Filled 2021-07-15: qty 30, 30d supply, fill #0

## 2021-07-15 MED ORDER — ASCORBIC ACID 1000 MG PO TABS
1000.0000 mg | ORAL_TABLET | Freq: Every day | ORAL | 0 refills | Status: DC
Start: 2021-07-15 — End: 2022-06-02
  Filled 2021-07-15: qty 30, 30d supply, fill #0

## 2021-07-15 MED ORDER — METOPROLOL TARTRATE 25 MG PO TABS
25.0000 mg | ORAL_TABLET | Freq: Three times a day (TID) | ORAL | 0 refills | Status: DC
  Filled 2021-07-15: qty 90, 30d supply, fill #0

## 2021-07-15 MED ORDER — AMLODIPINE BESYLATE 10 MG PO TABS
10.0000 mg | ORAL_TABLET | Freq: Every day | ORAL | 0 refills | Status: DC
  Filled 2021-07-15: qty 30, 30d supply, fill #0

## 2021-07-15 MED ORDER — ADULT MULTIVITAMIN W/MINERALS CH: 1.0000 | ORAL_TABLET | Freq: Every day | ORAL | Status: DC

## 2021-07-15 MED ORDER — FINASTERIDE 5 MG PO TABS
5.0000 mg | ORAL_TABLET | Freq: Every day | ORAL | 0 refills | Status: AC
  Filled 2021-07-15: qty 30, 30d supply, fill #0

## 2021-07-15 MED ORDER — DOCUSATE SODIUM 100 MG PO CAPS: 100.0000 mg | ORAL_CAPSULE | Freq: Two times a day (BID) | ORAL | 0 refills | Status: AC

## 2021-07-15 MED ORDER — TERAZOSIN HCL 5 MG PO CAPS
15.0000 mg | ORAL_CAPSULE | Freq: Every day | ORAL | 0 refills | Status: DC
  Filled 2021-07-15: qty 90, 30d supply, fill #0

## 2021-07-15 MED ORDER — OXYCODONE HCL 5 MG PO TABS
5.0000 mg | ORAL_TABLET | ORAL | 0 refills | Status: DC | PRN
  Filled 2021-07-15: qty 30, 5d supply, fill #0

## 2021-07-15 MED ORDER — MELATONIN 5 MG PO TABS
5.0000 mg | ORAL_TABLET | Freq: Every day | ORAL | 0 refills | Status: AC
  Filled 2021-07-15: qty 30, 30d supply, fill #0

## 2021-07-15 MED ORDER — POLYETHYLENE GLYCOL 3350 17 G PO PACK: 17.0000 g | PACK | Freq: Two times a day (BID) | ORAL | 0 refills | Status: DC

## 2021-07-15 MED ORDER — MONTELUKAST SODIUM 10 MG PO TABS
10.0000 mg | ORAL_TABLET | Freq: Every day | ORAL | 0 refills | Status: DC | PRN
Start: 2021-07-15 — End: 2021-08-24
  Filled 2021-07-15: qty 10, 10d supply, fill #0

## 2021-07-15 NOTE — Progress Notes (Signed)
Occupational Therapy Discharge Summary ? ?Patient Details  ?Name: Douglas [name] ?MRN: [id number] ?Date of Birth: [birth year] ? ? ?Patient has met 7 of 8 long term goals due to improved activity tolerance, ability to compensate for deficits, improved awareness, and improved coordination.  Patient to discharge at overall Supervision level for functional transfers and min A level for toileting.  Patient's care partner is independent to provide the necessary physical and cognitive assistance at discharge and has completed and been present for multiple family education sessions with verbalized agreement that she is able to assist pt at his CLOF at home. ? ?Reasons goals not met: toileting goal not met at supervision level vs min A due to generalized weakness, as well as self-limiting behaviors ? ?Recommendation:  ?Patient will benefit from ongoing skilled OT services in home health setting to continue to advance functional skills in the area of BADL and Reduce care partner burden. ? ?Equipment: ?Bariatric drop arm BSC ? ?Reasons for discharge: treatment goals met ? ?Patient/family agrees with progress made and goals achieved: Yes ? ?OT Discharge ?Precautions/Restrictions  ?Precautions ?Precautions: Fall ?Required Braces or Orthoses: Other Brace ?Other Brace: limb guard - but pt has refused to wear it ?Restrictions ?Weight Bearing Restrictions: Yes ?RLE Weight Bearing: Non weight bearing ?ADL ?ADL ?Eating: Independent ?Where Assessed-Eating: Chair ?Grooming: Modified independent ?Where Assessed-Grooming: Sitting at sink ?Upper Body Bathing: Modified independent ?Where Assessed-Upper Body Bathing: Shower ?Lower Body Bathing: Supervision/safety ?Where Assessed-Lower Body Bathing: Shower ?Upper Body Dressing: Setup ?Where Assessed-Upper Body Dressing: Edge of bed ?Lower Body Dressing: Supervision/safety ?Where Assessed-Lower Body Dressing: Edge of bed ?Toileting: Minimal assistance ?Where Assessed-Toileting: Bedside  Commode ?Toilet Transfer: Close supervision ?Toilet Transfer Method: Transfer board ?Science writer: Extra wide drop arm bedside commode ?Tub/Shower Transfer: Close supervison ?Tub/Shower Transfer Method: Transfer board ?Tub/Shower Equipment: Radio broadcast assistant, Grab bars ?Walk-In Shower Transfer: Close supervision ?Walk-In Shower Transfer Method: Teacher, adult education ?Walk-In Shower Equipment: Radio broadcast assistant, Grab bars ?Vision ?Baseline Vision/History: 1 Wears glasses ?Patient Visual Report: No change from baseline ?Vision Assessment?: No apparent visual deficits ?Perception  ?Perception: Within Functional Limits ?Praxis ?Praxis: Intact ?Cognition ?Cognition ?Overall Cognitive Status: Within Functional Limits for tasks assessed ?Arousal/Alertness: Awake/alert ?Orientation Level: Person;Place;Situation ?Person: Oriented ?Place: Oriented ?Situation: Oriented ?Memory: Appears intact ?Awareness: Impaired ?Problem Solving: Appears intact ?Safety/Judgment: Appears intact ?Comments: question poor ability to recall information vs being unreceptive to certain education ?Brief Interview for Mental Status (BIMS) ?Repetition of Three Words (First Attempt): 3 ?Temporal Orientation: Year: Correct ?Temporal Orientation: Month: Accurate within 5 days ?Temporal Orientation: Day: Correct ?Recall: "Sock": Yes, no cue required ?Recall: "Blue": Yes, no cue required ?Recall: "Bed": Yes, no cue required ?BIMS Summary Score: 15 ?Sensation ?Sensation ?Light Touch: Appears Intact ?Hot/Cold: Appears Intact ?Proprioception: Appears Intact ?Additional Comments: pt reports spontaneous phantom limb pain spasms ?Coordination ?Gross Motor Movements are Fluid and Coordinated: Yes ?Fine Motor Movements are Fluid and Coordinated: Yes ?Finger Nose Finger Test: slow but WFL ?Heel Shin Test: unable to perform on RLE due to BKA, WFL on LLE ?Motor  ?Motor ?Motor: Within Functional Limits ?Motor - Skilled Clinical Observations: generalized  weakness/deconditioning ?Mobility  ?Bed Mobility ?Bed Mobility: Rolling Right;Rolling Left;Supine to Sit;Sit to Supine ?Rolling Right: Independent with assistive device ?Rolling Left: Independent with assistive device ?Supine to Sit: Independent with assistive device (increased time) ?Sit to Supine: Independent with assistive device ?Transfers ?Sit to Stand: Moderate Assistance - Patient 50-74% (from elevated surface) ?Stand to Sit: Minimal Assistance - Patient > 75%  ?Trunk/Postural Assessment  ?Cervical  Assessment ?Cervical Assessment: Exceptions to [facility] (forward head) ?Thoracic Assessment ?Thoracic Assessment: Exceptions to [facility] (rounded shoulders) ?Lumbar Assessment ?Lumbar Assessment: Within Functional Limits ?Postural Control ?Postural Control: Deficits on evaluation ?Righting Reactions: occasional posterior and R lateral LOB/unsteadiness upon sitting up  ?Balance ?Balance ?Balance Assessed: Yes ?Static Sitting Balance ?Static Sitting - Balance Support: Feet supported;Bilateral upper extremity supported ?Static Sitting - Level of Assistance: 7: Independent ?Dynamic Sitting Balance ?Dynamic Sitting - Balance Support: Feet supported;No upper extremity supported ?Dynamic Sitting - Level of Assistance: 6: Modified independent (Device/Increase time) ?Static Standing Balance ?Static Standing - Balance Support: Bilateral upper extremity supported (RW) ?Static Standing - Level of Assistance: 4: Min assist ?Extremity/Trunk Assessment ?RUE Assessment ?RUE Assessment: Exceptions to [facility] ?Passive Range of Motion (PROM) Comments: WFL ?Active Range of Motion (AROM) Comments: Limited shoulder flex/abd at baseline. Dupuytren contractures of several digits. ?General Strength Comments: WFL at elbow, wrist and digits ?LUE Assessment ?LUE Assessment: Exceptions to [facility] ?Passive Range of Motion (PROM) Comments: WFL ?Active Range of Motion (AROM) Comments: Limited shoulder flex/abd at baseline. Dupuytren contractures of several  digits. ?General Strength Comments: WFL at elbow, wrist and digits ? ? ?Taryn Nave E Nasrin Lanzo ?07/15/2021, 8:56 AM ?

## 2021-07-15 NOTE — Progress Notes (Signed)
Patient ID: Douglas [name], male   DOB: 1950/07/31, 71 y.o.   MRN: [id number] ? ?SW met with spouse to confirmed discharge information. Patient spouse reports that she was not educated on patient needs. SW informed spouse that SW was informed that she declined nursing education earlier with week. SW spoke with RN and RN will complete nursing edu with spouse and patient tomorrow before discharge, spouse is agreeable. Patient will discharge tomorrow using medical transportation.   ?

## 2021-07-15 NOTE — Progress Notes (Signed)
?                                                       PROGRESS NOTE ? ? ?Subjective/Complaints: ?Didn't sleep too well last night between inability to void, ongoing pain in left leg.  ? ?ROS: Patient denies fever, rash, sore throat, blurred vision, dizziness, nausea, vomiting, diarrhea, cough, shortness of breath or chest pain,  headache, or mood change.  ? ?Objective: ?  ?No results found. ?Recent Labs  ?  07/14/21 ?6301  ?WBC 10.4  ?HGB 10.5*  ?HCT 32.8*  ?PLT 612*  ? ? ?Recent Labs  ?  07/14/21 ?6010  ?NA 130*  ?K 4.5  ?CL 97*  ?CO2 26  ?GLUCOSE 145*  ?BUN 19  ?CREATININE 0.64  ?CALCIUM 8.5*  ? ? ? ?Intake/Output Summary (Last 24 hours) at 07/15/2021 0956 ?Last data filed at 07/15/2021 9323 ?Gross per 24 hour  ?Intake 580 ml  ?Output 2950 ml  ?Net -2370 ml  ?  ? ?  ? ?Physical Exam: ?Vital Signs ?Blood pressure (P) 112/66, pulse 79, temperature 98.2 ?F (36.8 ?C), resp. rate (P) 16, height 6' (1.829 m), weight 96.7 kg, SpO2 (P) 98 %. ? ?Constitutional: No distress . Vital signs reviewed. ?HEENT: NCAT, EOMI, oral membranes moist ?Neck: supple ?Cardiovascular: RRR without murmur. No JVD    ?Respiratory/Chest: CTA Bilaterally without wheezes or rales. Normal effort    ?GI/Abdomen: BS +, non-tender, non-distended ?Ext: no clubbing, cyanosis, or edema ?Psych: pleasant and cooperative  ?Skin: right stump with eschar mild bleeding from incision, medial incisional opening still somewhat deep with sanguinous discharge, dry blood in wound.  ? ?Neuro:  Alert and oriented x 3. Normal insight and awareness. Intact Memory. Normal language and speech. Cranial nerve exam unremarkable. Moves all 4's, right leg limted somewhat by pain. Does appear more confused.  ?Musculoskeletal: right stump sensitive, shrinker in place ?GU: foley in place with clear, clear yellow urine ? ? ?Assessment/Plan: ?1. Functional deficits which require 3+ hours per day of interdisciplinary therapy in a comprehensive inpatient rehab setting. ?Physiatrist  is providing close team supervision and 24 hour management of active medical problems listed below. ?Physiatrist and rehab team continue to assess barriers to discharge/monitor patient progress toward functional and medical goals ? ?Care Tool: ? ?Bathing ?   ?Body parts bathed by patient: Right arm, Left arm, Chest, Abdomen, Right upper leg, Left upper leg, Left lower leg, Face, Front perineal area, Buttocks  ? Body parts bathed by helper: Buttocks ?Body parts n/a: Right lower leg ?  ?Bathing assist Assist Level: Supervision/Verbal cueing ?  ?  ?Upper Body Dressing/Undressing ?Upper body dressing   ?What is the patient wearing?: Pull over shirt ?   ?Upper body assist Assist Level: Set up assist ?   ?Lower Body Dressing/Undressing ?Lower body dressing ? ? ?   ?What is the patient wearing?: Incontinence brief ? ?  ? ?Lower body assist Assist for lower body dressing: Minimal Assistance - Patient > 75% ?   ? ?Toileting ?Toileting Toileting Activity did not occur (Probation officer and hygiene only): N/A (no void or bm)  ?Toileting assist Assist for toileting: Minimal Assistance - Patient > 75% ?  ?  ?Transfers ?Chair/bed transfer ? ?Transfers assist ?   ? ?Chair/bed transfer assist level: Supervision/Verbal cueing (slideboard) ?  ?  ?  Locomotion ?Ambulation ? ? ?Ambulation assist ? ? Ambulation activity did not occur: Refused ? ?  ?  ?   ? ?Walk 10 feet activity ? ? ?Assist ? Walk 10 feet activity did not occur: Refused ? ?  ?   ? ?Walk 50 feet activity ? ? ?Assist Walk 50 feet with 2 turns activity did not occur: Refused ? ?  ?   ? ? ?Walk 150 feet activity ? ? ?Assist Walk 150 feet activity did not occur: Refused ? ?  ?  ?  ? ?Walk 10 feet on uneven surface  ?activity ? ? ?Assist Walk 10 feet on uneven surfaces activity did not occur: Refused ? ? ?  ?   ? ?Wheelchair ? ? ? ? ?Assist Is the patient using a wheelchair?: Yes ?Type of Wheelchair: Manual ?  ? ?Wheelchair assist level: Supervision/Verbal cueing ?Max  wheelchair distance: 44f  ? ? ?Wheelchair 50 feet with 2 turns activity ? ? ? ?Assist ? ?  ?  ? ? ?Assist Level: Supervision/Verbal cueing  ? ?Wheelchair 150 feet activity  ? ? ? ?Assist ?   ? ? ?Assist Level: Dependent - Patient 0%  ? ?Blood pressure (P) 112/66, pulse 79, temperature 98.2 ?F (36.8 ?C), resp. rate (P) 16, height 6' (1.829 m), weight 96.7 kg, SpO2 (P) 98 %. ? ?Medical Problem List and Plan: ?1. 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            -patient may shower but incision must be covered.  ?            -ELOS/Goals: 4/7 mod I ?        -Continue CIR therapies including PT, OT  ?  ?2.  Antithrombotics: ?-DVT/anticoagulation:  Pharmaceutical: Heparin ?            -antiplatelet therapy: Aspirin 81 mg daily ?3. Phantom limb pain:  ?-decreased gabapentin to '300mg'$  at bedtime.  ?    -pain controlled ?4. Insomnia: dc elavil d/t AMS, gabapentin decreased as above ? -continue melatonin '5mg'$  qhs to assist sleep ? -  trazodone '50mg'$  qhs prn available ?            -antipsychotic agents: N/A ?5. Neuropsych: This patient is capable of making decisions on his own behalf. ?6. Skin/Wound Care: ongoing opening along incision from previous bpg along medial aspect of RLE, above bka incision ? -continue dry dressing, without packing for now. ?7. Fluids/Electrolytes/Nutrition: Routine in and outs with follow-up chemistries ?8.  Acute blood loss anemia.   ?  -hgb stable at 9.3 4/1---f/u 4/5--> 10.5 ?9.  NSVT/CAD.  Follow cardiology service.  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  Continue Lopressor 25 mg every 8 hours and considering consolidating to Toprol on discharge.  Follow-up outpatient ?10.  Coffee-ground emesis x1 3/14.  H&H stable.  Plan outpatient follow-up GI services as needed.  Continue Protonix twice daily. ? -asymptomatic ?11.  History of BPH/urinary retention.      ?4/6 -unable to void at all yesterday ?-reinsert foley  ?-continue finasteride  and hytrin  '15mg'$    ?-rx'ed UTI ?-bleeding resolved ?-will make outpt referral to urology   ?12.  Hyperlipidemia.  Crestor ?13.  Hypertension.  Norvasc 10 mg daily, Avapro 300 mg daily.  Monitor with increased mobility ? 4/6 bp controlled ?14.  CLL.  Follow-up outpatient Dr. SAlen Blew  Continue IMBRUVICA 420 mg daily. PATIENT CAN BRING IN OWN SUPPLY IF NEEDED. ?15.  Constipation.  Colace 100 mg twice daily ? -moving bowels ?16.  Hyperglycemia.  Hemoglobin A1c 6.7.  SSI.  Patient with no documented history of diabetes. ?17. Leukocytosis: likely d/t uti see below ?18. Transaminitis: stopped tylenol. Repeat CMP 4/5 ?19. Residual limb pain: continue Robaxin 500 mg 3 times daily, oxycodone as needed. Add vitamin C '1000mg'$  daily.  ?20. Hyponatremia ? -may be related to CLL vs meds ? -trending up. 130 4/5. ?21. UTI:  UC with >100k pseudomonas ? -keflex changed to levaquin which is complete ? -wbc's down to 10.4 4/5 ?  ? ?  ? ?LOS: ?14 days ?A FACE TO FACE EVALUATION WAS PERFORMED ? ?Meredith Staggers ?07/15/2021, 9:56 AM  ? ?   ?

## 2021-07-15 NOTE — Progress Notes (Addendum)
Inpatient Rehabilitation Discharge Medication Review by a Pharmacist ? ?A complete drug regimen review was completed for this patient to identify any potential clinically significant medication issues. ? ?High Risk Drug Classes Is patient taking? Indication by Medication  ?Antipsychotic No   ?Anticoagulant No   ?Antibiotic No   ?Opioid Yes Oxy IR - acute pain  ?Antiplatelet Yes bASA - ASCVD  ?Hypoglycemics/insulin No   ?Vasoactive Medication Yes Lopressor, Hytrin, Norvasc, Avapro - HTN  ?Chemotherapy Yes, Oral Chemotherapy and No Imbruvica - CLL  ?Other Yes Hytrin, Proscar - BPH ?Colace, Miralax - constipation ?Protonix - GERD ?Crestor - HLD ?Melatonin - sleep ?Robaxin - muscle spasms ?Gabapentin - neuropathic pain/phantom limb pain ?Singulair - allergies ?Ventolin HFA - Asthma ?Vitamin C, MIV - supplementation ?Afrin - prn congestion   ? ? ? ?Type of Medication Issue Identified Description of Issue Recommendation(s)  ?Drug Interaction(s) (clinically significant) ?    ?Duplicate Therapy ?    ?Allergy ?    ?No Medication Administration End Date ?    ?Incorrect Dose ?    ?Additional Drug Therapy Needed ?    ?Significant med changes from prior encounter (inform family/care partners about these prior to discharge).    ?Other ?    ? ? ?Clinically significant medication issues were identified that warrant physician communication and completion of prescribed/recommended actions by midnight of the next day:  No ? ?Time spent performing this drug regimen review (minutes):  30 ? ? ?Darden Dates Anastasov ?07/15/2021 10:50 AM ? ?Jermiah Howton BS, PharmD, BCPS ?Clinical Pharmacist ?07/16/2021 6:57 AM ?

## 2021-07-15 NOTE — Progress Notes (Signed)
Physical Therapy Session Note ? ?Patient Details  ?Name: Douglas [name] ?MRN: [id number] ?Date of Birth: [birth year] ? ?Today's Date: 07/15/2021 ?PT Individual Time: 6222-9798 and 9211-9417 ?PT Individual Time Calculation (min): 61 min  and 24 min ?Today's Date: 07/15/2021 ?PT Missed Time: 14 Minutes ?Missed Time Reason: Nursing care ? ?Short Term Goals: ?Week 1:  PT Short Term Goal 1 (Week 1): Pt will perform sit to stand from wc w/mod assist of 1 ?PT Short Term Goal 1 - Progress (Week 1): Not met ?PT Short Term Goal 2 (Week 1): pt will transfer bed to/from wc w/LRAD and mod assist ?PT Short Term Goal 2 - Progress (Week 1): Met ?PT Short Term Goal 3 (Week 1): Pt will propel wc 126f w/additional time ?PT Short Term Goal 3 - Progress (Week 1): Progressing toward goal ?PT Short Term Goal 4 (Week 1): Pt will tolerate gait assessment w/LRAD ?PT Short Term Goal 4 - Progress (Week 1): Not met ?Week 2:  PT Short Term Goal 1 (Week 2): STG=LTG due to LOS ? ?Skilled Therapeutic Interventions/Progress Updates:  ? Treatment Session 1 ?Received pt supine in bed with RN and NTs cathing pt requesting therapist return shortly. Therapist returned a few minutes later and pt agreeable to PT treatment and reported pain 3-4/10 in R residual limb - RN present to administer pain medication. MD arrived for morning rounds to inspect wound and reported plan to put Foley back in today. Pt reported having a "rough" night due to bladder complications. Session with emphasis on discharge planning, functional mobility/transfers, dressing, and generalized strengthening and endurance. PT assisted RN in gathering supplies for dressing change (gauze and paper tape) and donned clean shrinker with total A. Therapist hand washed pt's dirty shrinker and laid flat to dry. Donned pants in supine with supervision with 1 instance of min A to get pants over back of bulky brief. Pt transferred supine<>sitting EOB mod I with increased time using bedrail. Donned L  sock and shoe with supervision and pt transferred bed<>WC via slideboard with supervision with total A to place board. [facility] then instructed in [place] BUE and supervision with increased time with 1 turn - pt propelling with short inefficient strokes but when therapist educated pt on technique, pt reported that he "could not do it". Pt then transferred on/off mat via slideboard with supervision with total A to place/remove board and performed the following exercises with supervision and verbal cues for technique: ?-knee extensions 3x10 bilaterally with 2lb ankle weight on LLE ?-hip adduction ball squeezes 3x10 ?-WC pushups 2x5 (decreased ROM) ?Pt only able to tolerate sitting EOM with back unsupported for <10 minutes due to increased back/neck pain. Returned to room and pt reported urge to have a BM. Transferred WC<>bedside commode via slideboard with supervision (total A to place board) and performed tricep push up and therapist removed brief/pants with total A. Pt began having BM but reported onset of dizziness and requesting to return to bed. Of note, this has been happening over the past few days in therapy with pt's BP dropping slightly (but is lower at baseline). Encouraged deep breathing and to try to complete BM first - pt reported relief in symptoms with increased time. Concluded session with pt sitting on commode left in care of NT due to time restrictions.  ? ?Treatment Session 2 ?Received pt semi-reclined in bed, pt agreeable to PT treatment, and stated pain was "okay" this afternoon and that he  recently received pain medication. Pt's slideboard and WC delivered to room but incorrect size. Checked with CSW who reported [employer] will come out to pt's home and switch out 20x16 for 20x18 within the next few days. Donned pants in supine with max A for time management purposes and pt transferred semi-reclined<>sitting EOB with HOB elevated and use of  bedrails mod I. Educated pt on brake extender pieces, how to remove armrests on new WC, and reviewed how to fold up WC (again). Informed pt of discharge process and reassurred pt that staff will be able to assist pt's wife with loading belongings into her car. Pt transferred bed<>WC via slideboard with supervision with total A to place board. Adjusted length of legrest and pt performed WC mobility ~56f around room with supervision. [facility] and RN arrived to administer insulin. Concluded session with pt sitting in WC, needs within reach, and seatbelt alarm on.  ? ?Therapy Documentation ?Precautions:  ?Precautions ?Precautions: Fall ?Precaution Comments: R BKA 3/17 ?Required Braces or Orthoses: Other Brace ?Other Brace: limb guard - but pt has refused to wear it ?Restrictions ?Weight Bearing Restrictions: Yes ?RLE Weight Bearing: Non weight bearing ? ?Therapy/Group: Individual Therapy ?ABlenda Nicely?ABecky SaxPT, DPT  ?07/15/2021, 7:20 AM  ?

## 2021-07-15 NOTE — Progress Notes (Signed)
Occupational Therapy Session Note ? ?Patient Details  ?Name: Douglas [name] ?MRN: [id number] ?Date of Birth: 1950/11/08 ? ?Today's Date: 07/15/2021 ?OT Individual Time: 7846-9629 ?OT Individual Time Calculation (min): 25 min  ? ? ?Short Term Goals: ?Week 2:  OT Short Term Goal 1 (Week 2): STG = LTG due to ELOS ? ?Skilled Therapeutic Interventions/Progress Updates:  ?  Pt resting in bed upon arrival. Pt declined OOB activity this afternoon. Continued discharge planning and reviewed safety precautions. Pt states his wife has completed therapy education and will provide necessary assistance. Reviewed HEP. Pt pleased with progress and ready for discharge home tomorrow.  ? ?Therapy Documentation ?Precautions:  ?Precautions ?Precautions: Fall ?Precaution Comments: R BKA 3/17 ?Required Braces or Orthoses: Other Brace ?Other Brace: limb guard - but pt has refused to wear it ?Restrictions ?Weight Bearing Restrictions: Yes ?RLE Weight Bearing: Non weight bearing ?  ?Pain: ?Pt denies pain this afternoon ? ?Therapy/Group: Individual Therapy ? ?Leroy Libman ?07/15/2021, 1:29 PM ?

## 2021-07-15 NOTE — Progress Notes (Signed)
Occupational Therapy Session Note ? ?Patient Details  ?Name: Douglas [name] ?MRN: [id number] ?Date of Birth: 1950/12/21 ? ?Today's Date: 07/15/2021 ?OT Individual Time: 1005-1100 ?OT Individual Time Calculation (min): 55 min  ? ? ?Short Term Goals: ?Week 2:  OT Short Term Goal 1 (Week 2): STG = LTG due to ELOS ? ?Skilled Therapeutic Interventions/Progress Updates:  ?Skilled OT intervention completed with focus on family education, d/c planning, BUE strengthening. Pt received supine in bed, with pt's wife present, with pt reporting extreme dizziness and declining OOB therapy. However pt and pt's wife with questions regarding d/c plans and DME and recommendations for toileting at home. Of note- this therapist has spent several sessions including this one, answering questions of repetitive nature as well as educating and re-enforcing pt's need of supervision for transfers and min A for toileting. [employer] delivered 20x16 w/c and drop arm BSC during session with this therapist engaging in clarifying that the equipment this pt is recommended and needs per OT/PT is a bariatric drop arm BSC 2/2 need of slideboard transfers, as well as 20x20 w/c. Pt requested to speak to CSW in person, with team alerted. ? ?Pt completed bed level exercises for BUE strengthening to promote functional endurance including the following: ?(With red theraband anchored on bed rail) 15 reps each arm ?Horizontal abduction/adduction ?Shoulder flexion ?Bicep flexion ?Chest presses ?Shoulder external/internal rotation ?Shoulder extension  ? ?Pt was left semi-supine, with bed alarm on and all needs in reach at end of session. ? ? ?Therapy Documentation ?Precautions:  ?Precautions ?Precautions: Fall ?Precaution Comments: R BKA 3/17 ?Required Braces or Orthoses: Other Brace ?Other Brace: limb guard - but pt has refused to wear it ?Restrictions ?Weight Bearing Restrictions: Yes ?RLE Weight Bearing: Non weight bearing ? ?Pain: ?No c/o  pain ? ? ?Therapy/Group: Individual Therapy ? ?Tome Wilson E Taneasha Fuqua ?07/15/2021, 8:46 AM ?

## 2021-07-15 NOTE — Progress Notes (Signed)
Physical Therapy Discharge Summary ? ?Patient Details  ?Name: Douglas [name] ?MRN: [id number] ?Date of Birth: 1950-12-16 ? ?Patient has met 6 of 7 long term goals due to improved activity tolerance, improved balance, improved postural control, increased strength, decreased pain, and ability to compensate for deficits. Patient to discharge at a wheelchair level Supervision. Patient's care partner is independent to provide the necessary physical assistance at discharge. Pt's wife attended 2 family education training sessions and verbalized and demonstrated confidence with all tasks to ensure safe discharge home.  ? ?Reasons goals not met: Pt did not meet WC mobility goal of 62f due to generalized weakness/deconditioning, fatigue, and decreased motivation.  ? ?Recommendation:  ?Patient will benefit from ongoing skilled PT services in home health setting to continue to advance safe functional mobility, address ongoing impairments in transfers, generalized strengthening and endurance, dynamic standing balance/coordination, amputee education, and to minimize fall risk. ? ?Equipment: ?20x18 manual WC with R amputee support pad, slideboard ? ?Reasons for discharge: treatment goals met ? ?Patient/family agrees with progress made and goals achieved: Yes ? ?PT Discharge ?Precautions/Restrictions ?Precautions ?Precautions: Fall ?Required Braces or Orthoses: Other Brace ?Other Brace: limb guard - but pt has refused to wear it ?Restrictions ?Weight Bearing Restrictions: Yes ?RLE Weight Bearing: Non weight bearing ?Pain Interference ?Pain Interference ?Pain Effect on Sleep: 4. Almost constantly ?Pain Interference with Therapy Activities: 2. Occasionally ?Pain Interference with Day-to-Day Activities: 3. Frequently ?Vision/Perception  ?Vision - History ?Ability to See in Adequate Light: 0 Adequate ?Perception ?Perception: Within Functional Limits ?Praxis ?Praxis: Intact  ?Cognition ?Overall Cognitive Status: Within Functional Limits  for tasks assessed ?Arousal/Alertness: Awake/alert ?Orientation Level: Oriented X4 ?Memory: Appears intact ?Awareness: Impaired ?Problem Solving: Appears intact ?Safety/Judgment: Appears intact ?Comments: question poor ability to recall information vs being unreceptive to certain education ?Sensation ?Sensation ?Light Touch: Appears Intact ?Proprioception: Appears Intact ?Additional Comments: pt reports spontaneous phantom limb pain spasms ?Coordination ?Gross Motor Movements are Fluid and Coordinated: Yes ?Fine Motor Movements are Fluid and Coordinated: Yes ?Finger Nose Finger Test: slow but WFL ?Heel Shin Test: unable to perform on RLE due to BKA, WFL on LLE ?Motor  ?Motor ?Motor: Within Functional Limits ?Motor - Skilled Clinical Observations: generalized weakness/deconditioning  ?Mobility ?Bed Mobility ?Bed Mobility: Rolling Right;Rolling Left;Supine to Sit;Sit to Supine ?Rolling Right: Independent with assistive device ?Rolling Left: Independent with assistive device ?Supine to Sit: Independent with assistive device (increased time) ?Sit to Supine: Independent with assistive device ?Transfers ?Transfers: Sit to Stand;Stand to Sit;Lateral/Scoot Transfers ?Sit to Stand: Minimal Assistance - Patient > 75% (from elevated surface) ?Stand to Sit: Contact Guard/Touching assist ?Lateral/Scoot Transfers: Supervision/Verbal cueing ?Transfer (Assistive device): Other (Comment) (slideboard) ?Locomotion  ?Gait ?Ambulation: No ?Gait ?Gait: No ?Stairs / Additional Locomotion ?Stairs: No ?Pick up small object from the floor assist level: Dependent - Patient 0% ?Wheelchair Mobility ?Wheelchair Mobility: Yes ?Wheelchair Assistance: Supervision/Verbal cueing ?Wheelchair Propulsion: Both upper extremities ?Wheelchair Parts Management: Needs assistance ?Distance: 542f ?Trunk/Postural Assessment  ?Cervical Assessment ?Cervical Assessment: Exceptions to [facility]) ?Thoracic Assessment ?Thoracic Assessment: Exceptions to [facility] shoulders) ?Lumbar Assessment ?Lumbar Assessment: Within Functional Limits ?Postural Control ?Postural Control: Deficits on evaluation ?Righting Reactions: occasional posterior and R lateral LOB/unsteadiness upon sitting up  ?Balance ?Balance ?Balance Assessed: Yes ?Static Sitting Balance ?Static Sitting - Balance Support: Feet supported;Bilateral upper extremity supported ?Static Sitting - Level of Assistance: 7: Independent ?Dynamic Sitting Balance ?Dynamic Sitting - Balance Support: Feet supported;No upper extremity supported ?Dynamic Sitting - Level of Assistance: 6: Modified independent (Device/Increase  time) ?Static Standing Balance ?Static Standing - Balance Support: Bilateral upper extremity supported (RW) ?Static Standing - Level of Assistance: 4: Min assist ?Extremity Assessment  ?RLE Assessment ?RLE Assessment: Exceptions to [facility] ?RLE Strength ?Right Hip Flexion: 4-/5 ?Right Hip ABduction: 4-/5 ?Right Hip ADduction: 3+/5 ?Right Knee Flexion: 3+/5 ?Right Knee Extension: 3+/5 ?LLE Assessment ?LLE Assessment: Exceptions to [facility] ?LLE Strength ?Left Hip Flexion: 4/5 ?Left Hip ABduction: 4/5 ?Left Hip ADduction: 4-/5 ?Left Knee Flexion: 4-/5 ?Left Knee Extension: 4/5 ?Left Ankle Dorsiflexion: 4/5 ?Left Ankle Plantar Flexion: 4/5 ? ?Blenda Nicely ?Becky Sax PT, DPT  ?07/15/2021, 7:18 AM ?

## 2021-07-15 NOTE — Progress Notes (Incomplete)
Inpatient Rehabilitation Care Coordinator ?Discharge Note  ? ?Patient Details  ?Name: Douglas [name] ?MRN: [id number] ?Date of Birth: 03/11/1951 ? ? ?Discharge location: Home ? ?Length of Stay: 15 Days ? ?Discharge activity level: Sup/CGA ? ?Home/community participation: spouse ? ?Patient response FW:YOVZCH Literacy - How often do you need to have someone help you when you read instructions, pamphlets, or other written material from your doctor or pharmacy?: Sometimes ? ?Patient response YI:FOYDXA Isolation - How often do you feel lonely or isolated from those around you?: Never ? ?Services provided included: SW, Pharmacy, TR, CM, RN, SLP, OT, PT, RD, MD ? ?Financial Services:  ?Charity fundraiser Utilized: Private Insurance ?[employer] ? ?Choices offered to/list presented to: patient established previously ? ?Follow-up services arranged:  ?Home Health ?Home Health Agency: [facility]  ?  ?  ?HH/DME Requested Agency: [facility] ? ?Patient response to transportation need: ?Is the patient able to respond to transportation needs?: Yes ?In the past 12 months, has lack of transportation kept you from medical appointments or from getting medications?: No ?In the past 12 months, has lack of transportation kept you from meetings, work, or from getting things needed for daily living?: No ? ? ? ?Comments (or additional information): ? ?Patient/Family verbalized understanding of follow-up arrangements:  Yes ? ?Individual responsible for coordination of the follow-up plan: spouse ? ?Confirmed correct DME delivered: Dyanne Iha 07/15/2021   ? ?Dyanne Iha ?

## 2021-07-15 NOTE — Progress Notes (Signed)
[facility] ? ?Subjective  - Worried about the amputation and healing ? ? ?Objective ?123/60 ?78 ?97.9 ?F (36.6 ?C) ?16 ?97% ? ?Intake/Output Summary (Last 24 hours) at 07/15/2021 1629 ?Last data filed at 07/15/2021 7948 ?Gross per 24 hour  ?Intake 360 ml  ?Output 1850 ml  ?Net -1490 ml  ? ? ? ? ? ? ? ?Assessment/Planning: ?Failed bypass s/p right BKA ?Now with ischemic changes right BKA.   ? ?Dr. Virl Cagey discussed this with Dr. Donzetta Matters and we will plan right BKA conversion to AKA Monday 07/19/21.  Dr. Virl Cagey will speak with him and his wife tomorrow. ? ?Douglas [name] ?07/15/2021 ?4:29 PM ?-- ? ?Laboratory ?Lab Results: ?Recent Labs  ?  07/14/21 ?0165  ?WBC 10.4  ?HGB 10.5*  ?HCT 32.8*  ?PLT 612*  ? ?BMET ?Recent Labs  ?  07/14/21 ?5374  ?NA 130*  ?K 4.5  ?CL 97*  ?CO2 26  ?GLUCOSE 145*  ?BUN 19  ?CREATININE 0.64  ?CALCIUM 8.5*  ? ? ?COAG ?Lab Results  ?Component Value Date  ? INR 1.0 06/18/2021  ? INR 1.0 01/16/2021  ? ?No results found for: PTT ? ? ? ?

## 2021-07-16 LAB — GLUCOSE, CAPILLARY
Glucose-Capillary: 145 mg/dL — ABNORMAL HIGH (ref 70–99)
Glucose-Capillary: 159 mg/dL — ABNORMAL HIGH (ref 70–99)
Glucose-Capillary: 143 mg/dL — ABNORMAL HIGH (ref 70–99)

## 2021-07-16 MED ORDER — VANCOMYCIN HCL 1500 MG/300ML IV SOLN
1500.0000 mg | Freq: Two times a day (BID) | INTRAVENOUS | Status: DC
  Administered 2021-07-16 – 2021-07-18 (×6): 1500 mg via INTRAVENOUS
  Filled 2021-07-16 (×7): qty 300

## 2021-07-16 MED ORDER — SODIUM CHLORIDE 0.9 % IV SOLN: INTRAVENOUS | Status: DC | PRN

## 2021-07-16 MED ORDER — SODIUM CHLORIDE 0.9 % IV SOLN
2.0000 g | Freq: Three times a day (TID) | INTRAVENOUS | Status: DC
  Administered 2021-07-16 – 2021-07-19 (×9): 2 g via INTRAVENOUS
  Filled 2021-07-16 (×10): qty 12.5

## 2021-07-16 NOTE — Progress Notes (Signed)
Pharmacy Antibiotic Note ? ?Nethaniel Mattie is a 71 y.o. male admitted on 07/01/2021 with  Wound Infection .  Pharmacy has been consulted for Vancomycin/Cefepime dosing. ? ?Plan: ?Vancomycin 1500 IV every 12 hours.  Goal AUC 400-600 mcg*h/mL. ?Cefepime 2 g every 8 hours. ? ?Height: 6' (182.9 cm) ?Weight: 96.7 kg (213 lb 3 oz) ?IBW/kg (Calculated) : 77.6 ? ?Temp (24hrs), Avg:98.2 ?F (36.8 ?C), Min:97.9 ?F (36.6 ?C), Max:98.4 ?F (36.9 ?C) ? ?Recent Labs  ?Lab 07/10/21 ?3154 07/14/21 ?0086  ?WBC 11.2* 10.4  ?CREATININE  --  0.64  ?  ?Estimated Creatinine Clearance: 100.6 mL/min (by C-G formula based on SCr of 0.64 mg/dL).   ? ?Thank you for allowing pharmacy to be a part of this patient?s care. ? ?Hart Robinsons ?07/16/2021 11:44 AM ? ?

## 2021-07-16 NOTE — Progress Notes (Signed)
Patient ID: Douglas [name], male   DOB: July 16, 1950, 71 y.o.   MRN: [id number] ? ?Sw informed patient will not d/c home today.  ?

## 2021-07-16 NOTE — Progress Notes (Signed)
[facility] [place] ? ?Subjective  -upset regarding poorly healing below-knee amputation ? ? ?Objective ?116/60 ?73 ?98.3 ?F (36.8 ?C) (Oral) ?18 ?98% ? ?Intake/Output Summary (Last 24 hours) at 07/16/2021 1058 ?Last data filed at 07/16/2021 0900 ?Gross per 24 hour  ?Intake 420 ml  ?Output 1950 ml  ?Net -1530 ml  ? ? ? ? ? ? ? ? ?Assessment/Planning: ?Failed bypass s/p right BKA ?Now with ischemic changes right BKA.   ? ?I had a long conversation with Priyansh and his wife guarding his nonhealing right below-knee amputation.  We discussed converting this to an above-knee amputation Monday.  Kleber needs time to come to terms with the above, which is very understandable.  I have booked for right above-knee amputation Monday. ? ?Recommend use of broad-spectrum oral antibiotics over the next 3 days. ?Dry Kerlix wrap daily ?Pain control as needed.  ? ? ?Broadus John ?07/16/2021 ?10:58 AM ?-- ? ?Laboratory ?Lab Results: ?Recent Labs  ?  07/14/21 ?6295  ?WBC 10.4  ?HGB 10.5*  ?HCT 32.8*  ?PLT 612*  ? ? ?BMET ?Recent Labs  ?  07/14/21 ?2841  ?NA 130*  ?K 4.5  ?CL 97*  ?CO2 26  ?GLUCOSE 145*  ?BUN 19  ?CREATININE 0.64  ?CALCIUM 8.5*  ? ? ? ?COAG ?Lab Results  ?Component Value Date  ? INR 1.0 06/18/2021  ? INR 1.0 01/16/2021  ? ?No results found for: PTT ? ? ? ?

## 2021-07-16 NOTE — Progress Notes (Addendum)
?                                                       PROGRESS NOTE ? ? ?Subjective/Complaints: ?Pt had conversation yesterday with Vascular surgeon. When I rounded this morning he was unsure he wanted to purse AKA. Wanted to wife to speak with surgeon today (which just happened). Ongoing leg pain, drainage as documented ? ?ROS: Patient denies fever, rash, sore throat, blurred vision, dizziness, nausea, vomiting, diarrhea, cough, shortness of breath or chest pain,  headache, or mood change.  ? ?Objective: ?  ?No results found. ?Recent Labs  ?  07/14/21 ?7026  ?WBC 10.4  ?HGB 10.5*  ?HCT 32.8*  ?PLT 612*  ? ? ?Recent Labs  ?  07/14/21 ?3785  ?NA 130*  ?K 4.5  ?CL 97*  ?CO2 26  ?GLUCOSE 145*  ?BUN 19  ?CREATININE 0.64  ?CALCIUM 8.5*  ? ? ? ?Intake/Output Summary (Last 24 hours) at 07/16/2021 1121 ?Last data filed at 07/16/2021 0900 ?Gross per 24 hour  ?Intake 420 ml  ?Output 1950 ml  ?Net -1530 ml  ?  ? ?  ? ?Physical Exam: ?Vital Signs ?Blood pressure 116/60, pulse 73, temperature 98.3 ?F (36.8 ?C), temperature source Oral, resp. rate 18, height 6' (1.829 m), weight 96.7 kg, SpO2 98 %. ? ?Constitutional: No distress . Vital signs reviewed. ?HEENT: NCAT, EOMI, oral membranes moist ?Neck: supple ?Cardiovascular: RRR without murmur. No JVD    ?Respiratory/Chest: CTA Bilaterally without wheezes or rales. Normal effort    ?GI/Abdomen: BS +, non-tender, non-distended ?Ext: no clubbing, cyanosis, or edema ?Psych: pleasant and cooperative  ?Skin: right stump with eschar mild bleeding from incision, medial incisional dehisced, opened with s/s drainage. No odor ? ?Neuro:  Alert and oriented x 3. Normal insight and awareness. Intact Memory. Normal language and speech. Cranial nerve exam unremarkable. Moves all 4's, right leg limted somewhat by pain. Does appear more confused.  ?Musculoskeletal: right stump sensitive, skin as above ?GU: foley in place with clear, clear yellow urine ? ? ?Assessment/Plan: ?1. Functional deficits  which require 3+ hours per day of interdisciplinary therapy in a comprehensive inpatient rehab setting. ?Physiatrist is providing close team supervision and 24 hour management of active medical problems listed below. ?Physiatrist and rehab team continue to assess barriers to discharge/monitor patient progress toward functional and medical goals ? ?Care Tool: ? ?Bathing ?   ?Body parts bathed by patient: Right arm, Left arm, Chest, Abdomen, Right upper leg, Left upper leg, Left lower leg, Face, Front perineal area, Buttocks  ? Body parts bathed by helper: Buttocks ?Body parts n/a: Right lower leg (amputation) ?  ?Bathing assist Assist Level: Supervision/Verbal cueing ?  ?  ?Upper Body Dressing/Undressing ?Upper body dressing   ?What is the patient wearing?: Pull over shirt ?   ?Upper body assist Assist Level: Set up assist ?   ?Lower Body Dressing/Undressing ?Lower body dressing ? ? ?   ?What is the patient wearing?: Incontinence brief, Pants ? ?  ? ?Lower body assist Assist for lower body dressing: Supervision/Verbal cueing ?   ? ?Toileting ?Toileting Toileting Activity did not occur (Probation officer and hygiene only): N/A (no void or bm)  ?Toileting assist Assist for toileting: Minimal Assistance - Patient > 75% ?  ?  ?Transfers ?Chair/bed transfer ? ?Transfers assist ?   ? ?  Chair/bed transfer assist level: Supervision/Verbal cueing (slideboard) ?  ?  ?Locomotion ?Ambulation ? ? ?Ambulation assist ? ? Ambulation activity did not occur: Refused ? ?  ?  ?   ? ?Walk 10 feet activity ? ? ?Assist ? Walk 10 feet activity did not occur: Refused ? ?  ?   ? ?Walk 50 feet activity ? ? ?Assist Walk 50 feet with 2 turns activity did not occur: Refused ? ?  ?   ? ? ?Walk 150 feet activity ? ? ?Assist Walk 150 feet activity did not occur: Refused ? ?  ?  ?  ? ?Walk 10 feet on uneven surface  ?activity ? ? ?Assist Walk 10 feet on uneven surfaces activity did not occur: Refused ? ? ?  ?   ? ?Wheelchair ? ? ? ? ?Assist Is the  patient using a wheelchair?: Yes ?Type of Wheelchair: Manual ?  ? ?Wheelchair assist level: Supervision/Verbal cueing ?Max wheelchair distance: 34f  ? ? ?Wheelchair 50 feet with 2 turns activity ? ? ? ?Assist ? ?  ?  ? ? ?Assist Level: Supervision/Verbal cueing  ? ?Wheelchair 150 feet activity  ? ? ? ?Assist ?   ? ? ?Assist Level: Dependent - Patient 0%  ? ?Blood pressure 116/60, pulse 73, temperature 98.3 ?F (36.8 ?C), temperature source Oral, resp. rate 18, height 6' (1.829 m), weight 96.7 kg, SpO2 98 %. ? ?Medical Problem List and Plan: ?1. 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            -leg is non-healing with dehiscence medially on stump ?            -ELOS/Goals:  now pending surgical decision ?        -Continue CIR therapies including PT, OT  ? 4/7 pt spoke with surgeon today. He wants time to think about surgery and discuss with wife. Pt is tentatively scheduled for OR 4/10 for revision to right AKA ?2.  Antithrombotics: ?-DVT/anticoagulation:  Pharmaceutical: Heparin ?            -antiplatelet therapy: Aspirin 81 mg daily ?3. Phantom limb pain:  ?-decreased gabapentin to '300mg'$  at bedtime.  ?    -pain controlled ?4. Insomnia:   ? -continue melatonin '5mg'$  qhs to assist sleep ? - trazodone '50mg'$  qhs prn available ?            -antipsychotic agents: N/A ?5. Neuropsych: This patient is capable of making decisions on his own behalf. ?6. Skin/Wound Care: ongoing opening along incision from previous bpg along medial aspect of RLE, above bka incision which is dehiscing  ? -continue dry dressing to stump to keep dry.  ? 4/7 add vanc and cefepime for broad spectrum covg ?7. Fluids/Electrolytes/Nutrition: Routine in and outs with follow-up chemistries ?8.  Acute blood loss anemia.   ?  -hgb stable at 9.3 4/1---f/u 4/5--> 10.5 ?9.  NSVT/CAD.  Follow cardiology service.  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  Continue Lopressor 25 mg every 8 hours and  considering consolidating to Toprol on discharge.  Follow-up outpatient ?10.  Coffee-ground emesis x1 3/14.  H&H stable.  Plan outpatient follow-up GI services as needed.  Continue Protonix twice daily. ? -asymptomatic ?11.  History of BPH/urinary retention.      ?4/7 foley reinserted yesterday ?-continue finasteride and hytrin  '15mg'$    ?-rx'ed UTI ?-bleeding resolved ?-made outpt referral to urology   ?12.  Hyperlipidemia.  Crestor ?13.  Hypertension.  Norvasc 10 mg daily, Avapro 300 mg daily.  Monitor with increased mobility ? 4/7 bp controlled ?14.  CLL.  Follow-up outpatient Dr. Alen Blew.  Continue IMBRUVICA 420 mg daily. PATIENT CAN BRING IN OWN SUPPLY IF NEEDED. ?15.  Constipation.  Colace 100 mg twice daily ? -moving bowels ?16.  Hyperglycemia.  Hemoglobin A1c 6.7.  SSI.  Patient with no documented history of diabetes. ?17. Leukocytosis: likely d/t uti see below ?18. Transaminitis: stopped tylenol. Repeat CMP 4/5 ?19. Residual limb pain: continue Robaxin 500 mg 3 times daily, oxycodone as needed. Add vitamin C '1000mg'$  daily.  ?20. Hyponatremia ? -may be related to CLL vs meds ? -trending up. 130 4/5. ?21. UTI:  UC with >100k pseudomonas ? -keflex changed to levaquin which is complete ? -wbc's down to 10.4 4/5 ?  ? ?  ? ?LOS: ?15 days ?A FACE TO FACE EVALUATION WAS PERFORMED ? ?Meredith Staggers ?07/16/2021, 11:21 AM  ? ?   ?

## 2021-07-17 LAB — GLUCOSE, CAPILLARY
Glucose-Capillary: 116 mg/dL — ABNORMAL HIGH (ref 70–99)
Glucose-Capillary: 133 mg/dL — ABNORMAL HIGH (ref 70–99)
Glucose-Capillary: 191 mg/dL — ABNORMAL HIGH (ref 70–99)
Glucose-Capillary: 182 mg/dL — ABNORMAL HIGH (ref 70–99)

## 2021-07-17 NOTE — Progress Notes (Signed)
?                                                       PROGRESS NOTE ? ? ?Subjective/Complaints: ?Somnolent this morning ?Continue IV abx ? ?ROS: Patient denies fever, rash, sore throat, blurred vision, dizziness, nausea, vomiting, diarrhea, cough, shortness of breath or chest pain,  headache, or mood change.  ? ?Objective: ?  ?No results found. ?No results for input(s): WBC, HGB, HCT, PLT in the last 72 hours. ? ? ?No results for input(s): NA, K, CL, CO2, GLUCOSE, BUN, CREATININE, CALCIUM in the last 72 hours. ? ? ? ?Intake/Output Summary (Last 24 hours) at 07/17/2021 1307 ?Last data filed at 07/17/2021 1301 ?Gross per 24 hour  ?Intake 940 ml  ?Output 3700 ml  ?Net -2760 ml  ?  ? ?  ? ?Physical Exam: ?Vital Signs ?Blood pressure 114/61, pulse 78, temperature 98 ?F (36.7 ?C), temperature source Oral, resp. rate 18, height 6' (1.829 m), weight 96.7 kg, SpO2 97 %. ? ?Gen: no distress, normal appearing ?HEENT: oral mucosa pink and moist, NCAT ?Cardio: Reg rate ?Chest: normal effort, normal rate of breathing ?Abd: soft, non-distended ?Ext: no edema ?Psych: pleasant, normal affect ? ?Skin: right stump with eschar mild bleeding from incision, medial incisional dehisced, opened with s/s drainage. No odor ? ?Neuro:  Alert and oriented x 3. Normal insight and awareness. Intact Memory. Normal language and speech. Cranial nerve exam unremarkable. Moves all 4's, right leg limted somewhat by pain. Does appear more confused.  ?Musculoskeletal: right stump sensitive, skin as above ?GU: foley in place with clear, clear yellow urine ? ? ?Assessment/Plan: ?1. Functional deficits which require 3+ hours per day of interdisciplinary therapy in a comprehensive inpatient rehab setting. ?Physiatrist is providing close team supervision and 24 hour management of active medical problems listed below. ?Physiatrist and rehab team continue to assess barriers to discharge/monitor patient progress toward functional and medical goals ? ?Care  Tool: ? ?Bathing ?   ?Body parts bathed by patient: Right arm, Left arm, Chest, Abdomen, Right upper leg, Left upper leg, Left lower leg, Face, Front perineal area, Buttocks  ? Body parts bathed by helper: Buttocks ?Body parts n/a: Right lower leg (amputation) ?  ?Bathing assist Assist Level: Supervision/Verbal cueing ?  ?  ?Upper Body Dressing/Undressing ?Upper body dressing   ?What is the patient wearing?: Pull over shirt ?   ?Upper body assist Assist Level: Set up assist ?   ?Lower Body Dressing/Undressing ?Lower body dressing ? ? ?   ?What is the patient wearing?: Incontinence brief, Pants ? ?  ? ?Lower body assist Assist for lower body dressing: Supervision/Verbal cueing ?   ? ?Toileting ?Toileting Toileting Activity did not occur (Probation officer and hygiene only): N/A (no void or bm)  ?Toileting assist Assist for toileting: Minimal Assistance - Patient > 75% ?  ?  ?Transfers ?Chair/bed transfer ? ?Transfers assist ?   ? ?Chair/bed transfer assist level: Supervision/Verbal cueing (slideboard) ?  ?  ?Locomotion ?Ambulation ? ? ?Ambulation assist ? ? Ambulation activity did not occur: Refused ? ?  ?  ?   ? ?Walk 10 feet activity ? ? ?Assist ? Walk 10 feet activity did not occur: Refused ? ?  ?   ? ?Walk 50 feet activity ? ? ?Assist Walk 50 feet with 2  turns activity did not occur: Refused ? ?  ?   ? ? ?Walk 150 feet activity ? ? ?Assist Walk 150 feet activity did not occur: Refused ? ?  ?  ?  ? ?Walk 10 feet on uneven surface  ?activity ? ? ?Assist Walk 10 feet on uneven surfaces activity did not occur: Refused ? ? ?  ?   ? ?Wheelchair ? ? ? ? ?Assist Is the patient using a wheelchair?: Yes ?Type of Wheelchair: Manual ?  ? ?Wheelchair assist level: Supervision/Verbal cueing ?Max wheelchair distance: 53f  ? ? ?Wheelchair 50 feet with 2 turns activity ? ? ? ?Assist ? ?  ?  ? ? ?Assist Level: Supervision/Verbal cueing  ? ?Wheelchair 150 feet activity  ? ? ? ?Assist ?   ? ? ?Assist Level: Dependent - Patient 0%   ? ?Blood pressure 114/61, pulse 78, temperature 98 ?F (36.7 ?C), temperature source Oral, resp. rate 18, height 6' (1.829 m), weight 96.7 kg, SpO2 97 %. ? ?Medical Problem List and Plan: ?1. 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            -leg is non-healing with dehiscence medially on stump ?            -ELOS/Goals:  now pending surgical decision ?        -Continue CIR therapies including PT, OT  ? 4/7 pt spoke with surgeon today. He wants time to think about surgery and discuss with wife. Pt is tentatively scheduled for OR 4/10 for revision to right AKA ?2.  Antithrombotics: ?-DVT/anticoagulation:  Pharmaceutical: Heparin ?            -antiplatelet therapy: Aspirin 81 mg daily ?3. Phantom limb pain:  ?-continue gabapentin to '300mg'$  at bedtime.  ?    -pain controlled ?4. Insomnia:   ? -continue melatonin '5mg'$  qhs to assist sleep ? - trazodone '50mg'$  qhs prn available ?            -antipsychotic agents: N/A ?5. Neuropsych: This patient is capable of making decisions on his own behalf. ?6. Skin/Wound Care: ongoing opening along incision from previous bpg along medial aspect of RLE, above bka incision which is dehiscing  ? -continue dry dressing to stump to keep dry.  ? -continue vanc and cefepime for broad spectrum covg ?7. Fluids/Electrolytes/Nutrition: Routine in and outs with follow-up chemistries ?8.  Acute blood loss anemia.   ?  -hgb stable at 9.3 4/1---f/u 4/5--> 10.5 ?9.  NSVT/CAD.  Follow cardiology service.  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  Continue Lopressor 25 mg every 8 hours and considering consolidating to Toprol on discharge.  Follow-up outpatient ?10.  Coffee-ground emesis x1 3/14.  H&H stable.  Plan outpatient follow-up GI services as needed.  Continue Protonix twice daily. ? -asymptomatic ?11.  History of BPH/urinary retention.      ?4/7 foley reinserted yesterday ?-continue finasteride and hytrin  '15mg'$    ?-rx'ed UTI ?-bleeding  resolved ?-made outpt referral to urology   ?12.  Hyperlipidemia.  Crestor ?13.  Hypertension.  Norvasc 10 mg daily, Avapro 300 mg daily.  Monitor with increased mobility ? 4/7 bp controlled ?14.  CLL.  Follow-up outpatient Dr. SAlen Blew  Continue IMBRUVICA 420 mg daily. PATIENT CAN BRING IN OWN SUPPLY IF NEEDED. ?15.  Constipation.  Colace 100 mg twice daily ? -moving bowels ?16.  Hyperglycemia.  Hemoglobin A1c 6.7.  SSI.  Patient with no documented history of diabetes. ?17.  Leukocytosis: likely d/t uti see below ?18. Transaminitis: stopped tylenol. Repeat CMP 4/5 ?19. Residual limb pain: continue Robaxin 500 mg 3 times daily, oxycodone as needed. Add vitamin C '1000mg'$  daily.  ?20. Hyponatremia ? -may be related to CLL vs meds ? -trending up. 130 4/5. ?21. UTI:  UC with >100k pseudomonas ? -keflex changed to levaquin which is complete ? -wbc's down to 10.4 4/5 ?  ? ?  ? ?LOS: ?16 days ?A FACE TO FACE EVALUATION WAS PERFORMED ? ?Douglas [name] ?07/17/2021, 1:07 PM  ? ?   ?

## 2021-07-18 LAB — GLUCOSE, CAPILLARY
Glucose-Capillary: 125 mg/dL — ABNORMAL HIGH (ref 70–99)
Glucose-Capillary: 151 mg/dL — ABNORMAL HIGH (ref 70–99)
Glucose-Capillary: 202 mg/dL — ABNORMAL HIGH (ref 70–99)
Glucose-Capillary: 146 mg/dL — ABNORMAL HIGH (ref 70–99)

## 2021-07-18 MED ORDER — AMITRIPTYLINE HCL 10 MG PO TABS
10.0000 mg | ORAL_TABLET | Freq: Every day | ORAL | Status: DC
  Administered 2021-07-18: 10 mg via ORAL
  Filled 2021-07-18: qty 1

## 2021-07-18 MED ORDER — HYDROMORPHONE HCL 1 MG/ML IJ SOLN
0.5000 mg | Freq: Once | INTRAMUSCULAR | Status: AC
  Administered 2021-07-18: 0.5 mg via INTRAVENOUS
  Filled 2021-07-18: qty 0.5

## 2021-07-18 MED ORDER — MAGNESIUM HYDROXIDE 400 MG/5ML PO SUSP
30.0000 mL | Freq: Once | ORAL | Status: AC
  Administered 2021-07-18: 30 mL via ORAL
  Filled 2021-07-18: qty 30

## 2021-07-18 NOTE — Progress Notes (Signed)
NPO at midnight ?DC Heparin ?Patient to take all other medications with sips of water per Dr. Virl Cagey. ? ? ? ?Yehuda Mao, LPN ?

## 2021-07-18 NOTE — H&P (View-Only) (Signed)
[facility] ? ?Subjective  -smiling, some mild pain in the right BKA stump.  Much more positive in the last visit ? ? ?Objective ?(!) 119/52 ?73 ?97.9 ?F (36.6 ?C) (Oral) ?17 ?98% ? ?Intake/Output Summary (Last 24 hours) at 07/18/2021 1417 ?Last data filed at 07/18/2021 1308 ?Gross per 24 hour  ?Intake 1400 ml  ?Output 1875 ml  ?Net -475 ml  ? ? ? ? ? ? ? ? ?Assessment/Planning: ?Failed bypass s/p right BKA ?Now with ischemic changes right BKA.   ? ?Douglas [name] elected to move forward with right leg above-knee amputation tomorrow. ?Approximate time of surgery 10 AM ?Please make n.p.o. midnight ? ?Douglas [name] ?07/18/2021 ?2:17 PM ?-- ? ?Laboratory ?Lab Results: ?No results for input(s): WBC, HGB, HCT, PLT in the last 72 hours. ? ?BMET ?No results for input(s): NA, K, CL, CO2, GLUCOSE, BUN, CREATININE, CALCIUM in the last 72 hours. ? ? ?COAG ?Lab Results  ?Component Value Date  ? INR 1.0 06/18/2021  ? INR 1.0 01/16/2021  ? ?No results found for: PTT ? ? ? ?

## 2021-07-18 NOTE — Progress Notes (Signed)
?                                                       PROGRESS NOTE ? ? ?Subjective/Complaints: ?Slept poorly last night due to pain and anxiety regarding his procedure tomorrow. He asks what time the procedure is tomorrow- messaged nursing to please let him know once we have a time. He does not want to return to rehab post-surgery as he feels he has made enough progress here.  ? ?ROS: Patient denies fever, rash, sore throat, blurred vision, dizziness, nausea, vomiting, diarrhea, cough, shortness of breath or chest pain,  headache, or mood change. +residual limb pain ? ?Objective: ?  ?No results found. ?No results for input(s): WBC, HGB, HCT, PLT in the last 72 hours. ? ? ?No results for input(s): NA, K, CL, CO2, GLUCOSE, BUN, CREATININE, CALCIUM in the last 72 hours. ? ? ? ?Intake/Output Summary (Last 24 hours) at 07/18/2021 1009 ?Last data filed at 07/18/2021 1194 ?Gross per 24 hour  ?Intake 2002.22 ml  ?Output 3275 ml  ?Net -1272.78 ml  ?  ? ?  ? ?Physical Exam: ?Vital Signs ?Blood pressure 125/62, pulse 77, temperature 97.7 ?F (36.5 ?C), temperature source Oral, resp. rate 18, height 6' (1.829 m), weight 96.7 kg, SpO2 97 %. ?Gen: no distress, normal appearing ?HEENT: oral mucosa pink and moist, NCAT ?Cardio: Reg rate ?Chest: normal effort, normal rate of breathing ?Abd: soft, non-distended ?Ext: no edema ?Psych: pleasant, normal affect ? ?Skin: right stump with eschar mild bleeding from incision, medial incisional dehisced, opened with s/s drainage. No odor ? ?Neuro:  Alert and oriented x 3. Normal insight and awareness. Intact Memory. Normal language and speech. Cranial nerve exam unremarkable. Moves all 4's, right leg limted somewhat by pain. Does appear more confused.  ?Musculoskeletal: right stump sensitive, skin as above ?GU: foley in place with clear, clear yellow urine ? ? ?Assessment/Plan: ?1. Functional deficits which require 3+ hours per day of interdisciplinary therapy in a comprehensive inpatient  rehab setting. ?Physiatrist is providing close team supervision and 24 hour management of active medical problems listed below. ?Physiatrist and rehab team continue to assess barriers to discharge/monitor patient progress toward functional and medical goals ? ?Care Tool: ? ?Bathing ?   ?Body parts bathed by patient: Right arm, Left arm, Chest, Abdomen, Right upper leg, Left upper leg, Left lower leg, Face, Front perineal area, Buttocks  ? Body parts bathed by helper: Buttocks ?Body parts n/a: Right lower leg (amputation) ?  ?Bathing assist Assist Level: Supervision/Verbal cueing ?  ?  ?Upper Body Dressing/Undressing ?Upper body dressing   ?What is the patient wearing?: Pull over shirt ?   ?Upper body assist Assist Level: Set up assist ?   ?Lower Body Dressing/Undressing ?Lower body dressing ? ? ?   ?What is the patient wearing?: Incontinence brief, Pants ? ?  ? ?Lower body assist Assist for lower body dressing: Supervision/Verbal cueing ?   ? ?Toileting ?Toileting Toileting Activity did not occur (Probation officer and hygiene only): N/A (no void or bm)  ?Toileting assist Assist for toileting: Minimal Assistance - Patient > 75% ?  ?  ?Transfers ?Chair/bed transfer ? ?Transfers assist ?   ? ?Chair/bed transfer assist level: Supervision/Verbal cueing (slideboard) ?  ?  ?Locomotion ?Ambulation ? ? ?Ambulation assist ? ? Ambulation activity did  not occur: Refused ? ?  ?  ?   ? ?Walk 10 feet activity ? ? ?Assist ? Walk 10 feet activity did not occur: Refused ? ?  ?   ? ?Walk 50 feet activity ? ? ?Assist Walk 50 feet with 2 turns activity did not occur: Refused ? ?  ?   ? ? ?Walk 150 feet activity ? ? ?Assist Walk 150 feet activity did not occur: Refused ? ?  ?  ?  ? ?Walk 10 feet on uneven surface  ?activity ? ? ?Assist Walk 10 feet on uneven surfaces activity did not occur: Refused ? ? ?  ?   ? ?Wheelchair ? ? ? ? ?Assist Is the patient using a wheelchair?: Yes ?Type of Wheelchair: Manual ?  ? ?Wheelchair assist  level: Supervision/Verbal cueing ?Max wheelchair distance: 56f  ? ? ?Wheelchair 50 feet with 2 turns activity ? ? ? ?Assist ? ?  ?  ? ? ?Assist Level: Supervision/Verbal cueing  ? ?Wheelchair 150 feet activity  ? ? ? ?Assist ?   ? ? ?Assist Level: Dependent - Patient 0%  ? ?Blood pressure 125/62, pulse 77, temperature 97.7 ?F (36.5 ?C), temperature source Oral, resp. rate 18, height 6' (1.829 m), weight 96.7 kg, SpO2 97 %. ? ?Medical Problem List and Plan: ?1. 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            -leg is non-healing with dehiscence medially on stump ?            -ELOS/Goals:  now pending surgical decision ?        -Continue CIR therapies including PT, OT  ?D/c tomorrow to surgery for AKA- patient's preference is not to return to rehab afterward and to d/c home once stable with hospital bed, home nursing services, and home PT and OT, and I said this would be reasonable.  ?2.  Antithrombotics: ?-DVT/anticoagulation:  Pharmaceutical: Heparin ?            -antiplatelet therapy: Aspirin 81 mg daily ?3. Phantom limb pain:  ?-continue gabapentin to '300mg'$  at bedtime.  ? -add amitriptyline '10mg'$  HS ?4. Insomnia:   ? -continue melatonin '5mg'$  qhs to assist sleep ? - trazodone '50mg'$  qhs prn available ?            -add amitriptyline '10mg'$  HS ?5. Neuropsych: This patient is capable of making decisions on his own behalf. ?6. Skin/Wound Care: ongoing opening along incision from previous bpg along medial aspect of RLE, above bka incision which is dehiscing  ? -continue dry dressing to stump to keep dry.  ? -continue vanc and cefepime for broad spectrum covg ?7. Fluids/Electrolytes/Nutrition: Routine in and outs with follow-up chemistries ?8.  Acute blood loss anemia.   ?  -hgb stable at 9.3 4/1---f/u 4/5--> 10.5 ?9.  NSVT/CAD.  Follow cardiology service.  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  Continue Lopressor 25 mg every 8 hours and considering consolidating  to Toprol on discharge.  Follow-up outpatient ?10.  Coffee-ground emesis x1 3/14.  H&H stable.  Plan outpatient follow-up GI services as needed.  Continue Protonix twice daily. ? -asymptomatic ?11.  History of BPH/urinary retention.      ?4/7 foley reinserted yesterday ?-continue finasteride and hytrin  '15mg'$    ?-rx'ed UTI ?-bleeding resolved ?-made outpt referral to urology   ?12.  Hyperlipidemia.  Crestor ?13.  Hypertension.  Norvasc 10 mg daily, Avapro 300 mg daily.  Monitor with increased  mobility ? 4/7 bp controlled ?14.  CLL.  Follow-up outpatient Dr. Alen Blew.  Continue IMBRUVICA 420 mg daily. PATIENT CAN BRING IN OWN SUPPLY IF NEEDED. ?15.  Constipation.  Colace 100 mg twice daily ? -moving bowels ?16.  Hyperglycemia.  Hemoglobin A1c 6.7.  SSI.  Patient with no documented history of diabetes. ?17. Leukocytosis: likely d/t uti see below ?18. Transaminitis: stopped tylenol. Repeat CMP 4/5 ?19. Residual limb pain: continue Robaxin 500 mg 3 times daily, oxycodone as needed. Add vitamin C '1000mg'$  daily. IV Dilaudid 0.'5mg'$  x1 on 4/9.  ?20. Hyponatremia ? -may be related to CLL vs meds ? -trending up. 130 4/5. ?21. UTI:  UC with >100k pseudomonas ? -keflex changed to levaquin which is complete ? -wbc's down to 10.4 4/5 ?  ? ?  ? ?LOS: ?17 days ?A FACE TO FACE EVALUATION WAS PERFORMED ? ?Samael Blades P Ailene Royal ?07/18/2021, 10:09 AM  ? ?   ?

## 2021-07-18 NOTE — Progress Notes (Signed)
[facility] ? ?Subjective  -smiling, some mild pain in the right BKA stump.  Much more positive in the last visit ? ? ?Objective ?(!) 119/52 ?73 ?97.9 ?F (36.6 ?C) (Oral) ?17 ?98% ? ?Intake/Output Summary (Last 24 hours) at 07/18/2021 1417 ?Last data filed at 07/18/2021 1308 ?Gross per 24 hour  ?Intake 1400 ml  ?Output 1875 ml  ?Net -475 ml  ? ? ? ? ? ? ? ? ?Assessment/Planning: ?Failed bypass s/p right BKA ?Now with ischemic changes right BKA.   ? ?Emersen elected to move forward with right leg above-knee amputation tomorrow. ?Approximate time of surgery 10 AM ?Please make n.p.o. midnight ? ?Douglas [name] ?07/18/2021 ?2:17 PM ?-- ? ?Laboratory ?Lab Results: ?No results for input(s): WBC, HGB, HCT, PLT in the last 72 hours. ? ?BMET ?No results for input(s): NA, K, CL, CO2, GLUCOSE, BUN, CREATININE, CALCIUM in the last 72 hours. ? ? ?COAG ?Lab Results  ?Component Value Date  ? INR 1.0 06/18/2021  ? INR 1.0 01/16/2021  ? ?No results found for: PTT ? ? ? ?

## 2021-07-19 ENCOUNTER — Encounter (HOSPITAL_COMMUNITY)
Admission: RE | Disposition: A | Discharge status: Home Health | Payer: Self-pay | Source: Home / Self Care | Attending: Vascular Surgery

## 2021-07-19 ENCOUNTER — Inpatient Hospital Stay (HOSPITAL_COMMUNITY): Payer: 59 | Admitting: Anesthesiology

## 2021-07-19 ENCOUNTER — Inpatient Hospital Stay (HOSPITAL_COMMUNITY)
Admission: RE | Admit: 2021-07-19 | Discharge: 2021-07-23 | DRG: 475 | Disposition: A | Discharge status: Home Health | Payer: 59 | Attending: Vascular Surgery | Admitting: Vascular Surgery

## 2021-07-19 ENCOUNTER — Other Ambulatory Visit: Payer: Self-pay

## 2021-07-19 ENCOUNTER — Encounter (HOSPITAL_COMMUNITY): Payer: Self-pay | Admitting: Vascular Surgery

## 2021-07-19 DIAGNOSIS — E1151 Type 2 diabetes mellitus with diabetic peripheral angiopathy without gangrene: Secondary | ICD-10-CM | POA: Diagnosis not present

## 2021-07-19 DIAGNOSIS — Y835 Amputation of limb(s) as the cause of abnormal reaction of the patient, or of later complication, without mention of misadventure at the time of the procedure: Secondary | ICD-10-CM | POA: Diagnosis present

## 2021-07-19 DIAGNOSIS — T8789 Other complications of amputation stump: Principal | ICD-10-CM | POA: Diagnosis present

## 2021-07-19 DIAGNOSIS — I441 Atrioventricular block, second degree: Secondary | ICD-10-CM | POA: Diagnosis not present

## 2021-07-19 DIAGNOSIS — I739 Peripheral vascular disease, unspecified: Principal | ICD-10-CM | POA: Diagnosis present

## 2021-07-19 DIAGNOSIS — I252 Old myocardial infarction: Secondary | ICD-10-CM

## 2021-07-19 DIAGNOSIS — E1152 Type 2 diabetes mellitus with diabetic peripheral angiopathy with gangrene: Secondary | ICD-10-CM | POA: Diagnosis present

## 2021-07-19 DIAGNOSIS — I70261 Atherosclerosis of native arteries of extremities with gangrene, right leg: Secondary | ICD-10-CM | POA: Diagnosis present

## 2021-07-19 DIAGNOSIS — Z89511 Acquired absence of right leg below knee: Secondary | ICD-10-CM | POA: Diagnosis not present

## 2021-07-19 DIAGNOSIS — I10 Essential (primary) hypertension: Secondary | ICD-10-CM

## 2021-07-19 HISTORY — PX: AMPUTATION: SHX166

## 2021-07-19 LAB — GLUCOSE, CAPILLARY
Glucose-Capillary: 107 mg/dL — ABNORMAL HIGH (ref 70–99)
Glucose-Capillary: 128 mg/dL — ABNORMAL HIGH (ref 70–99)
Glucose-Capillary: 129 mg/dL — ABNORMAL HIGH (ref 70–99)
Glucose-Capillary: 136 mg/dL — ABNORMAL HIGH (ref 70–99)

## 2021-07-19 LAB — CBC
HCT: 31.5 % — ABNORMAL LOW (ref 39.0–52.0)
Hemoglobin: 9.9 g/dL — ABNORMAL LOW (ref 13.0–17.0)
MCH: 28.7 pg (ref 26.0–34.0)
MCHC: 31.4 g/dL (ref 30.0–36.0)
MCV: 91.3 fL (ref 80.0–100.0)
Platelets: 611 10*3/uL — ABNORMAL HIGH (ref 150–400)
RBC: 3.45 MIL/uL — ABNORMAL LOW (ref 4.22–5.81)
RDW: 16.8 % — ABNORMAL HIGH (ref 11.5–15.5)
WBC: 12 10*3/uL — ABNORMAL HIGH (ref 4.0–10.5)
nRBC: 0 % (ref 0.0–0.2)

## 2021-07-19 LAB — SURGICAL PCR SCREEN
MRSA, PCR: NEGATIVE
Staphylococcus aureus: NEGATIVE

## 2021-07-19 LAB — CREATININE, SERUM
Creatinine, Ser: 0.63 mg/dL (ref 0.61–1.24)
GFR, Estimated: >60 mL/min (ref >60)

## 2021-07-19 SURGERY — AMPUTATION, ABOVE KNEE
Anesthesia: General | Site: Knee | Laterality: Right

## 2021-07-19 MED ORDER — METOPROLOL TARTRATE 5 MG/5ML IV SOLN: 2.0000 mg | INTRAVENOUS | Status: DC | PRN

## 2021-07-19 MED ORDER — MORPHINE SULFATE (PF) 2 MG/ML IV SOLN
0.5000 mg | INTRAVENOUS | Status: DC | PRN
  Administered 2021-07-20: 1 mg via INTRAVENOUS
  Filled 2021-07-19: qty 1

## 2021-07-19 MED ORDER — ALUM & MAG HYDROXIDE-SIMETH 200-200-20 MG/5ML PO SUSP: 15.0000 mL | ORAL | Status: DC | PRN

## 2021-07-19 MED ORDER — OXYCODONE HCL 5 MG/5ML PO SOLN: 5.0000 mg | Freq: Once | ORAL | Status: DC | PRN

## 2021-07-19 MED ORDER — AMISULPRIDE (ANTIEMETIC) 5 MG/2ML IV SOLN: 10.0000 mg | Freq: Once | INTRAVENOUS | Status: DC | PRN

## 2021-07-19 MED ORDER — HYDROCODONE-ACETAMINOPHEN 5-325 MG PO TABS
1.0000 | ORAL_TABLET | ORAL | Status: DC | PRN
  Administered 2021-07-20 – 2021-07-22 (×4): 2 via ORAL
  Filled 2021-07-19 (×4): qty 2

## 2021-07-19 MED ORDER — HYDROMORPHONE HCL 1 MG/ML IJ SOLN
INTRAMUSCULAR | Status: AC
  Filled 2021-07-19: qty 1

## 2021-07-19 MED ORDER — HEPARIN SODIUM (PORCINE) 5000 UNIT/ML IJ SOLN
5000.0000 [IU] | Freq: Three times a day (TID) | INTRAMUSCULAR | Status: DC
  Administered 2021-07-19 – 2021-07-23 (×12): 5000 [IU] via SUBCUTANEOUS
  Filled 2021-07-19 (×12): qty 1

## 2021-07-19 MED ORDER — CHLORHEXIDINE GLUCONATE 0.12 % MT SOLN
15.0000 mL | Freq: Once | OROMUCOSAL | Status: AC
  Administered 2021-07-19: 15 mL via OROMUCOSAL
  Filled 2021-07-19: qty 15

## 2021-07-19 MED ORDER — ONDANSETRON HCL 4 MG/2ML IJ SOLN: 4.0000 mg | Freq: Once | INTRAMUSCULAR | Status: DC | PRN

## 2021-07-19 MED ORDER — PROPOFOL 10 MG/ML IV BOLUS
INTRAVENOUS | Status: DC | PRN
  Administered 2021-07-19: 150 mg via INTRAVENOUS

## 2021-07-19 MED ORDER — ONDANSETRON HCL 4 MG/2ML IJ SOLN: 4.0000 mg | Freq: Four times a day (QID) | INTRAMUSCULAR | Status: DC | PRN

## 2021-07-19 MED ORDER — PANTOPRAZOLE SODIUM 40 MG PO TBEC
40.0000 mg | DELAYED_RELEASE_TABLET | Freq: Every day | ORAL | Status: DC
  Administered 2021-07-19 – 2021-07-23 (×5): 40 mg via ORAL
  Filled 2021-07-19 (×5): qty 1

## 2021-07-19 MED ORDER — LIDOCAINE 2% (20 MG/ML) 5 ML SYRINGE
INTRAMUSCULAR | Status: DC | PRN
  Administered 2021-07-19: 100 mg via INTRAVENOUS

## 2021-07-19 MED ORDER — ONDANSETRON HCL 4 MG/2ML IJ SOLN
INTRAMUSCULAR | Status: DC | PRN
  Administered 2021-07-19: 4 mg via INTRAVENOUS

## 2021-07-19 MED ORDER — GUAIFENESIN-DM 100-10 MG/5ML PO SYRP
15.0000 mL | ORAL_SOLUTION | ORAL | Status: DC | PRN
  Filled 2021-07-19: qty 15

## 2021-07-19 MED ORDER — HYDROCODONE-ACETAMINOPHEN 7.5-325 MG PO TABS
1.0000 | ORAL_TABLET | ORAL | Status: DC | PRN
  Administered 2021-07-19 (×2): 2 via ORAL
  Administered 2021-07-20: 1 via ORAL
  Administered 2021-07-20 (×2): 2 via ORAL
  Filled 2021-07-19 (×2): qty 2
  Filled 2021-07-19: qty 1
  Filled 2021-07-19 (×2): qty 2

## 2021-07-19 MED ORDER — PHENYLEPHRINE 40 MCG/ML (10ML) SYRINGE FOR IV PUSH (FOR BLOOD PRESSURE SUPPORT)
PREFILLED_SYRINGE | INTRAVENOUS | Status: DC | PRN
  Administered 2021-07-19 (×2): 120 ug via INTRAVENOUS

## 2021-07-19 MED ORDER — FENTANYL CITRATE (PF) 100 MCG/2ML IJ SOLN
INTRAMUSCULAR | Status: AC
  Administered 2021-07-19: 50 ug via INTRAVENOUS
  Filled 2021-07-19: qty 2

## 2021-07-19 MED ORDER — FENTANYL CITRATE (PF) 100 MCG/2ML IJ SOLN: 50.0000 ug | Freq: Once | INTRAMUSCULAR | Status: AC

## 2021-07-19 MED ORDER — POTASSIUM CHLORIDE CRYS ER 20 MEQ PO TBCR: 20.0000 meq | EXTENDED_RELEASE_TABLET | Freq: Once | ORAL | Status: DC | PRN

## 2021-07-19 MED ORDER — ACETAMINOPHEN 325 MG PO TABS
325.0000 mg | ORAL_TABLET | Freq: Four times a day (QID) | ORAL | Status: DC | PRN
  Administered 2021-07-19: 650 mg via ORAL
  Filled 2021-07-19: qty 2

## 2021-07-19 MED ORDER — KETOROLAC TROMETHAMINE 30 MG/ML IJ SOLN
INTRAMUSCULAR | Status: DC | PRN
  Administered 2021-07-19: 15 mg via INTRAVENOUS

## 2021-07-19 MED ORDER — DOCUSATE SODIUM 100 MG PO CAPS
100.0000 mg | ORAL_CAPSULE | Freq: Every day | ORAL | Status: DC
  Administered 2021-07-22 – 2021-07-23 (×2): 100 mg via ORAL
  Filled 2021-07-19 (×3): qty 1

## 2021-07-19 MED ORDER — BACITRACIN ZINC 500 UNIT/GM EX OINT
TOPICAL_OINTMENT | CUTANEOUS | Status: AC
  Filled 2021-07-19: qty 28.35

## 2021-07-19 MED ORDER — FENTANYL CITRATE (PF) 250 MCG/5ML IJ SOLN
INTRAMUSCULAR | Status: DC | PRN
  Administered 2021-07-19 (×6): 25 ug via INTRAVENOUS
  Administered 2021-07-19: 50 ug via INTRAVENOUS
  Administered 2021-07-19 (×2): 25 ug via INTRAVENOUS

## 2021-07-19 MED ORDER — TRAMADOL HCL 50 MG PO TABS
50.0000 mg | ORAL_TABLET | Freq: Four times a day (QID) | ORAL | Status: DC
  Administered 2021-07-19 – 2021-07-23 (×15): 50 mg via ORAL
  Filled 2021-07-19 (×15): qty 1

## 2021-07-19 MED ORDER — INSULIN ASPART 100 UNIT/ML IJ SOLN: 0.0000 [IU] | INTRAMUSCULAR | Status: DC | PRN

## 2021-07-19 MED ORDER — FENTANYL CITRATE (PF) 250 MCG/5ML IJ SOLN
INTRAMUSCULAR | Status: AC
  Filled 2021-07-19: qty 5

## 2021-07-19 MED ORDER — PHENOL 1.4 % MT LIQD: 1.0000 | OROMUCOSAL | Status: DC | PRN

## 2021-07-19 MED ORDER — MIDAZOLAM HCL 2 MG/2ML IJ SOLN
INTRAMUSCULAR | Status: AC
  Filled 2021-07-19: qty 2

## 2021-07-19 MED ORDER — BACITRACIN ZINC 500 UNIT/GM EX OINT
TOPICAL_OINTMENT | CUTANEOUS | Status: DC | PRN
  Administered 2021-07-19: 1 via TOPICAL

## 2021-07-19 MED ORDER — LABETALOL HCL 5 MG/ML IV SOLN: 10.0000 mg | INTRAVENOUS | Status: DC | PRN

## 2021-07-19 MED ORDER — MAGNESIUM SULFATE 2 GM/50ML IV SOLN: 2.0000 g | Freq: Once | INTRAVENOUS | Status: DC | PRN

## 2021-07-19 MED ORDER — LACTATED RINGERS IV SOLN: INTRAVENOUS | Status: DC

## 2021-07-19 MED ORDER — CEFAZOLIN SODIUM-DEXTROSE 2-4 GM/100ML-% IV SOLN
INTRAVENOUS | Status: AC
  Filled 2021-07-19: qty 100

## 2021-07-19 MED ORDER — PHENYLEPHRINE HCL-NACL 20-0.9 MG/250ML-% IV SOLN
INTRAVENOUS | Status: DC | PRN
  Administered 2021-07-19: 50 ug/min via INTRAVENOUS

## 2021-07-19 MED ORDER — ORAL CARE MOUTH RINSE: 15.0000 mL | Freq: Once | OROMUCOSAL | Status: AC

## 2021-07-19 MED ORDER — 0.9 % SODIUM CHLORIDE (POUR BTL) OPTIME
TOPICAL | Status: DC | PRN
  Administered 2021-07-19: 1000 mL

## 2021-07-19 MED ORDER — SODIUM CHLORIDE 0.9 % IV SOLN: INTRAVENOUS | Status: DC

## 2021-07-19 MED ORDER — HYDRALAZINE HCL 20 MG/ML IJ SOLN: 5.0000 mg | INTRAMUSCULAR | Status: DC | PRN

## 2021-07-19 MED ORDER — PROPOFOL 10 MG/ML IV BOLUS
INTRAVENOUS | Status: AC
  Filled 2021-07-19: qty 20

## 2021-07-19 MED ORDER — OXYCODONE HCL 5 MG PO TABS: 5.0000 mg | ORAL_TABLET | Freq: Once | ORAL | Status: DC | PRN

## 2021-07-19 MED ORDER — HYDROMORPHONE HCL 1 MG/ML IJ SOLN
0.2500 mg | INTRAMUSCULAR | Status: DC | PRN
  Administered 2021-07-19 (×5): 0.25 mg via INTRAVENOUS

## 2021-07-19 SURGICAL SUPPLY — 57 items
BAG COUNTER SPONGE SURGICOUNT (BAG) ×2 IMPLANT
BANDAGE ESMARK 6X9 LF (GAUZE/BANDAGES/DRESSINGS) ×1 IMPLANT
BLADE SAW RECIP 87.9 MT (BLADE) ×2 IMPLANT
BNDG COHESIVE 6X5 TAN STRL LF (GAUZE/BANDAGES/DRESSINGS) ×2 IMPLANT
BNDG ELASTIC 4X5.8 VLCR STR LF (GAUZE/BANDAGES/DRESSINGS) ×3 IMPLANT
BNDG ESMARK 6X9 LF (GAUZE/BANDAGES/DRESSINGS) ×2
BNDG GAUZE ELAST 4 BULKY (GAUZE/BANDAGES/DRESSINGS) ×3 IMPLANT
CANISTER SUCT 3000ML PPV (MISCELLANEOUS) ×2 IMPLANT
COVER SURGICAL LIGHT HANDLE (MISCELLANEOUS) ×2 IMPLANT
CUFF TOURN SGL QUICK 18X4 (TOURNIQUET CUFF) IMPLANT
CUFF TOURN SGL QUICK 24 (TOURNIQUET CUFF)
CUFF TOURN SGL QUICK 34 (TOURNIQUET CUFF) ×1
CUFF TOURN SGL QUICK 42 (TOURNIQUET CUFF) IMPLANT
CUFF TRNQT CYL 24X4X16.5-23 (TOURNIQUET CUFF) IMPLANT
CUFF TRNQT CYL 34X4.125X (TOURNIQUET CUFF) IMPLANT
DRAIN CHANNEL 19F RND (DRAIN) IMPLANT
DRAPE HALF SHEET 40X57 (DRAPES) ×2 IMPLANT
DRAPE INCISE IOBAN 66X45 STRL (DRAPES) ×2 IMPLANT
DRAPE ORTHO SPLIT 77X108 STRL (DRAPES) ×2
DRAPE SURG ORHT 6 SPLT 77X108 (DRAPES) ×2 IMPLANT
DRAPE U-SHAPE 47X51 STRL (DRAPES) ×2 IMPLANT
DRSG ADAPTIC 3X8 NADH LF (GAUZE/BANDAGES/DRESSINGS) ×2 IMPLANT
ELECT CAUTERY BLADE 6.4 (BLADE) ×2 IMPLANT
ELECT REM PT RETURN 9FT ADLT (ELECTROSURGICAL) ×2
ELECTRODE REM PT RTRN 9FT ADLT (ELECTROSURGICAL) ×1 IMPLANT
EVACUATOR SILICONE 100CC (DRAIN) IMPLANT
GAUZE SPONGE 4X4 12PLY STRL (GAUZE/BANDAGES/DRESSINGS) ×2 IMPLANT
GLOVE SRG 8 PF TXTR STRL LF DI (GLOVE) ×1 IMPLANT
GLOVE SURG ENC MOIS LTX SZ7.5 (GLOVE) ×2 IMPLANT
GLOVE SURG POLY ORTHO LF SZ7.5 (GLOVE) IMPLANT
GLOVE SURG UNDER LTX SZ8 (GLOVE) ×2 IMPLANT
GLOVE SURG UNDER POLY LF SZ8 (GLOVE) ×1
GOWN STRL REUS W/ TWL LRG LVL3 (GOWN DISPOSABLE) ×3 IMPLANT
GOWN STRL REUS W/TWL LRG LVL3 (GOWN DISPOSABLE) ×3
KIT BASIN OR (CUSTOM PROCEDURE TRAY) ×2 IMPLANT
KIT TURNOVER KIT B (KITS) ×2 IMPLANT
NS IRRIG 1000ML POUR BTL (IV SOLUTION) ×2 IMPLANT
PACK GENERAL/GYN (CUSTOM PROCEDURE TRAY) ×2 IMPLANT
PAD ARMBOARD 7.5X6 YLW CONV (MISCELLANEOUS) ×4 IMPLANT
PADDING CAST ABS 4INX4YD NS (CAST SUPPLIES) ×1
PADDING CAST ABS COTTON 4X4 ST (CAST SUPPLIES) IMPLANT
PENCIL SMOKE EVACUATOR (MISCELLANEOUS) ×1 IMPLANT
RASP HELIOCORDIAL MED (MISCELLANEOUS) IMPLANT
STAPLER VISISTAT 35W (STAPLE) ×2 IMPLANT
STOCKINETTE IMPERVIOUS LG (DRAPES) ×2 IMPLANT
SUT ETHILON 3 0 PS 1 (SUTURE) IMPLANT
SUT SILK 0 TIES 10X30 (SUTURE) ×2 IMPLANT
SUT SILK 2 0 (SUTURE) ×1
SUT SILK 2 0 SH CR/8 (SUTURE) ×2 IMPLANT
SUT SILK 2-0 18XBRD TIE 12 (SUTURE) ×1 IMPLANT
SUT SILK 3 0 (SUTURE) ×1
SUT SILK 3-0 18XBRD TIE 12 (SUTURE) ×1 IMPLANT
SUT VIC AB 2-0 CT1 18 (SUTURE) ×2 IMPLANT
TOWEL GREEN STERILE (TOWEL DISPOSABLE) ×4 IMPLANT
TOWEL GREEN STERILE FF (TOWEL DISPOSABLE) ×2 IMPLANT
UNDERPAD 30X36 HEAVY ABSORB (UNDERPADS AND DIAPERS) ×2 IMPLANT
WATER STERILE IRR 1000ML POUR (IV SOLUTION) ×2 IMPLANT

## 2021-07-19 NOTE — Interval H&P Note (Signed)
History and Physical Interval Note: ? ?07/19/2021 ?9:27 AM ? ?Douglas [name]  has presented today for surgery, with the diagnosis of Revision Amputation.  The various methods of treatment have been discussed with the patient and family. After consideration of risks, benefits and other options for treatment, the patient has consented to  Procedure(s): ?AMPUTATION ABOVE KNEE (Right) as a surgical intervention.  The patient's history has been reviewed, patient examined, no change in status, stable for surgery.  I have reviewed the patient's chart and labs.  Questions were answered to the patient's satisfaction.   ? ? ?Deitra Mayo ? ? ?

## 2021-07-19 NOTE — Transfer of Care (Signed)
Immediate Anesthesia Transfer of Care Note ? ?Patient: Douglas [name] ? ?Procedure(s) Performed: AMPUTATION ABOVE KNEE (Right: Knee) ? ?Patient Location: PACU ? ?Anesthesia Type:General ? ?Level of Consciousness: drowsy ? ?Airway & Oxygen Therapy: Patient Spontanous Breathing and Patient connected to face mask oxygen ? ?Post-op Assessment: Report given to RN and Post -op Vital signs reviewed and stable ? ?Post vital signs: Reviewed and stable ? ?Last Vitals:  ?Vitals Value Taken Time  ?BP 119/58 07/19/21 1118  ?Temp    ?Pulse 66 07/19/21 1119  ?Resp 11 07/19/21 1119  ?SpO2 95 % 07/19/21 1119  ?Vitals shown include unvalidated device data. ? ?Last Pain:  ?Vitals:  ? 07/19/21 0915  ?TempSrc:   ?PainSc: Asleep  ?   ? ?Patients Stated Pain Goal: 3 (07/19/21 0905) ? ?Complications: No notable events documented. ?

## 2021-07-19 NOTE — Op Note (Signed)
? ? ?  [name]    MRN: [id number] ?DOB: Jul 06, 1950    DATE OF OPERATION: 07/19/2021 ? ?PREOP DIAGNOSIS:   ? ?Nonhealing right below the knee amputation ? ?POSTOP DIAGNOSIS:   ? ?Same ? ?PROCEDURE:  ?  ?Right above-the-knee amputation ? ?SURGEON: Judeth Cornfield. Scot Dock, MD ? ?ASSIST: Wandra Arthurs, RNFA ? ?ANESTHESIA: General ? ?EBL: Minimal ? ?INDICATIONS:  ? ? Douglas [name] is a 71 y.o. male who had undergone right lower extremity revascularization and ultimately required a right below the knee amputation.  This failed to heal and he presents for above-the-knee amputation. ? ?FINDINGS:  ? ?The muscle was well-perfused at the above-knee level. ? ?TECHNIQUE:  ? ?The patient was taken to the operating room and received a general anesthetic.  The right leg was prepped and draped in usual sterile fashion.  Tourniquet was placed on the upper thigh.  A fishmouth incision was marked above the level of the patella.  The leg was exsanguinated with an Esmarch bandage and the tourniquet inflated to 300 mmHg.  Under tourniquet control the incision was carried down to the skin, subcutaneous tissue, fascia and muscle to the femur which was dissected free circumferentially.  The periosteum was elevated.  The bone was divided proximal to the level of skin division.  The leg was removed.  The femoral artery and vein were individually suture-ligated with 2-0 silk ties.  The tourniquet was then released.  Additional hemostasis was obtained using electrocautery and 2-0 silk sutures.  The edges of the bone were rasped.  The wound was irrigated.  The fascial layer was closed with interrupted 2-0 Vicryl's.  The skin was closed with staples.  Sterile dressing was applied.  The patient tolerated the procedure well and was transferred to the recovery room in stable condition.  All needle and sponge counts were correct. ? ?Given the complexity of the case a first assistant was necessary in order to expedient the procedure and safely  perform the technical aspects of the operation. ? ?Deitra Mayo, MD, FACS ?[facility] ? ?DATE OF DICTATION:   07/19/2021  ?

## 2021-07-19 NOTE — Progress Notes (Signed)
Inpatient Rehabilitation Discharge Medication Review by a Pharmacist ? ?A complete drug regimen review was completed for this patient to identify any potential clinically significant medication issues. ? ?High Risk Drug Classes Is patient taking? Indication by Medication  ?Antipsychotic No   ?Anticoagulant No   ?Antibiotic No   ?Opioid Yes Oxy IR - acute pain  ?Antiplatelet Yes bASA - ASCVD  ?Hypoglycemics/insulin No   ?Vasoactive Medication Yes Lopressor, Hytrin, Norvasc, Avapro - HTN  ?Chemotherapy Yes, Oral Chemotherapy and No Imbruvica - CLL  ?Other Yes Hytrin, Proscar - BPH ?Colace, Miralax - constipation ?Protonix - GERD ?Crestor - HLD ?Melatonin - sleep ?Robaxin - muscle spasms ?Gabapentin - neuropathic pain/phantom limb pain ?Singulair - allergies ?Ventolin HFA - Asthma ?Vitamin C, MIV - supplementation ?Afrin - prn congestion   ? ? ? ?Type of Medication Issue Identified Description of Issue Recommendation(s)  ?Drug Interaction(s) (clinically significant) ?    ?Duplicate Therapy ?    ?Allergy ?    ?No Medication Administration End Date ?    ?Incorrect Dose ?    ?Additional Drug Therapy Needed ?    ?Significant med changes from prior encounter (inform family/care partners about these prior to discharge).    ?Other ?    ? ? ?Clinically significant medication issues were identified that warrant physician communication and completion of prescribed/recommended actions by midnight of the next day:  No ? ?Time spent performing this drug regimen review (minutes):  30 ? ? ?Hillari Zumwalt BS, PharmD, BCPS ?Clinical Pharmacist ?07/19/2021 7:11 AM ?

## 2021-07-19 NOTE — Anesthesia Procedure Notes (Signed)
Procedure Name: LMA Insertion ?Date/Time: 07/19/2021 9:58 AM ?Performed by: Erick Colace, CRNA ?Pre-anesthesia Checklist: Patient identified, Emergency Drugs available, Suction available and Patient being monitored ?Patient Re-evaluated:Patient Re-evaluated prior to induction ?Oxygen Delivery Method: Circle system utilized ?Preoxygenation: Pre-oxygenation with 100% oxygen ?Induction Type: IV induction ?Ventilation: Mask ventilation without difficulty ?LMA: LMA inserted ?LMA Size: 5.0 ?Laser Tube: Cuffed inflated with minimal occlusive pressure - saline ?Placement Confirmation: positive ETCO2 and breath sounds checked- equal and bilateral ?Tube secured with: Tape ?Dental Injury: Teeth and Oropharynx as per pre-operative assessment  ? ? ? ? ?

## 2021-07-19 NOTE — Progress Notes (Signed)
Pt arrived from PACU, VSS, Chg complete, tele started, oriented to unit, call light within reach.  ? ?Alvis Lemmings, RN ?07/19/2021 ?2:58 PM ? ?

## 2021-07-19 NOTE — Progress Notes (Signed)
Patient discharged from Inpatient Rehab. ? ? ?Yehuda Mao, LPN ?

## 2021-07-19 NOTE — Anesthesia Preprocedure Evaluation (Signed)
Anesthesia Evaluation  ?Patient identified by MRN, date of birth, ID band ?Patient awake ? ? ? ?Reviewed: ?Allergy & Precautions, NPO status , Patient's Chart, lab work & pertinent test results ? ?History of Anesthesia Complications ?Negative for: history of anesthetic complications ? ?Airway ?Mallampati: II ? ?TM Distance: >3 FB ?Neck ROM: Full ? ? ? Dental ? ?(+) Dental Advisory Given ?  ?Pulmonary ?neg pulmonary ROS, former smoker,  ?  ?Pulmonary exam normal ? ? ? ? ? ? ? Cardiovascular ?hypertension, + Past MI and + Peripheral Vascular Disease  ?Normal cardiovascular exam+ pacemaker  ? ? ?Echo 01/17/21: EF 50-55% with regional wall motion abnormalities, moderate hypokinesis of the left ventricular, basal-mid inferolateral wall, normal RVSF, valves unremarkable ? ?Cath 01/18/21: mild nonobstructive CAD ?  ?Neuro/Psych ?negative neurological ROS ?   ? GI/Hepatic ?negative GI ROS, Neg liver ROS,   ?Endo/Other  ?diabetes ? Renal/GU ?negative Renal ROS  ?negative genitourinary ?  ?Musculoskeletal ?negative musculoskeletal ROS ?(+)  ? Abdominal ?  ?Peds ? Hematology ? ?(+) Blood dyscrasia, anemia , CLL   ?Anesthesia Other Findings ? ? Reproductive/Obstetrics ? ?  ? ? ? ? ? ? ? ? ? ? ? ? ? ?  ?  ? ? ? ? ? ? ?Anesthesia Physical ?Anesthesia Plan ? ?ASA: 3 ? ?Anesthesia Plan: General  ? ?Post-op Pain Management: Dilaudid IV and Toradol IV (intra-op)*  ? ?Induction: Intravenous ? ?PONV Risk Score and Plan: 2 and Ondansetron, Dexamethasone, Midazolam and Treatment may vary due to age or medical condition ? ?Airway Management Planned: LMA ? ?Additional Equipment: None ? ?Intra-op Plan:  ? ?Post-operative Plan: Extubation in OR ? ?Informed Consent: I have reviewed the patients History and Physical, chart, labs and discussed the procedure including the risks, benefits and alternatives for the proposed anesthesia with the patient or authorized representative who has indicated his/her  understanding and acceptance.  ? ? ? ?Dental advisory given ? ?Plan Discussed with:  ? ?Anesthesia Plan Comments:   ? ? ? ? ? ? ?Anesthesia Quick Evaluation ? ?

## 2021-07-20 ENCOUNTER — Ambulatory Visit (INDEPENDENT_AMBULATORY_CARE_PROVIDER_SITE_OTHER): Payer: 59

## 2021-07-20 ENCOUNTER — Encounter (HOSPITAL_COMMUNITY): Payer: Self-pay | Admitting: Vascular Surgery

## 2021-07-20 DIAGNOSIS — I441 Atrioventricular block, second degree: Secondary | ICD-10-CM | POA: Diagnosis not present

## 2021-07-20 LAB — CBC
HCT: 32.8 % — ABNORMAL LOW (ref 39.0–52.0)
Hemoglobin: 10 g/dL — ABNORMAL LOW (ref 13.0–17.0)
MCH: 28 pg (ref 26.0–34.0)
MCHC: 30.5 g/dL (ref 30.0–36.0)
MCV: 91.9 fL (ref 80.0–100.0)
Platelets: 593 10*3/uL — ABNORMAL HIGH (ref 150–400)
RBC: 3.57 MIL/uL — ABNORMAL LOW (ref 4.22–5.81)
RDW: 16.8 % — ABNORMAL HIGH (ref 11.5–15.5)
WBC: 12 10*3/uL — ABNORMAL HIGH (ref 4.0–10.5)
nRBC: 0 % (ref 0.0–0.2)

## 2021-07-20 LAB — BASIC METABOLIC PANEL
Anion gap: 6 (ref 5–15)
BUN: 15 mg/dL (ref 8–23)
CO2: 25 mmol/L (ref 22–32)
Calcium: 8.4 mg/dL — ABNORMAL LOW (ref 8.9–10.3)
Chloride: 101 mmol/L (ref 98–111)
Creatinine, Ser: 0.59 mg/dL — ABNORMAL LOW (ref 0.61–1.24)
GFR, Estimated: >60 mL/min (ref >60)
Glucose, Bld: 113 mg/dL — ABNORMAL HIGH (ref 70–99)
Potassium: 4.5 mmol/L (ref 3.5–5.1)
Sodium: 132 mmol/L — ABNORMAL LOW (ref 135–145)

## 2021-07-20 LAB — SURGICAL PATHOLOGY

## 2021-07-20 LAB — GLUCOSE, CAPILLARY: Glucose-Capillary: 161 mg/dL — ABNORMAL HIGH (ref 70–99)

## 2021-07-20 NOTE — Evaluation (Signed)
Physical Therapy Evaluation Patient Details Name: Douglas [name] MRN: [id number] DOB: March 26, 1951 Today's Date: 07/20/2021  History of Present Illness  Patient is a 71 y/o male who presents from [facility] on 4/10 for Right AKA. 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 PMH includes pacemaker, DM, HTN, CLL.  Clinical Impression  Patient presents with new right AKA 4/10; reports increased pain and post surgical deficits impacting mobility/willingness to mobilize. "I just want 24 hours to get my pain under control and not have to move." Pt d/ced from [facility] where he was transferring to w/c using slide board and mainly at w/c level. Pt reports he is not going back to rehab but plans to d/c home with support of wife. Wife reports pt needs to be at min guard level for transfers in order to safely return home as well as ambulance transport home due to having stairs to climb to enter home. Lengthy discussion with pt/wife with regards to mobility expectations and DME needs. Discussed importance of pain management, AROM of BLEs, pressure relief techniques, elevating HOB throughout the day to encourage upright/BP tolerance and mobility to prepare for d/c as well as decrease adverse affects of being in the bed re: DVTs, PNA, pressure sores, hospital deconditioning etc. Pt will need bariatric drop arm BSC for hospital room for toileting. Also with need hospital bed, drop arm bariatric BSC and ambulance transport home. Pt self limiting during evaluation today due to pain reporting a 5/10 and wanting it to be more at a 2/10 as it was the previous night. Will follow acutely to maximize independence and progress OOB mobility to prepare pt for d/c home.     Recommendations for follow up therapy are one component of a multi-disciplinary discharge planning process, led by the attending physician.  Recommendations may be updated based on patient status, additional functional criteria and insurance authorization.  Follow Up  Recommendations Home health PT    Assistance Recommended at Discharge Frequent or constant Supervision/Assistance  Patient can return home with the following  Two people to help with walking and/or transfers;A lot of help with bathing/dressing/bathroom;Assistance with cooking/housework;Assist for transportation;Help with stairs or ramp for entrance    Equipment Recommendations BSC/3in1;Hospital bed (bariatric drop arm BSC)  Recommendations for Other Services       Functional Status Assessment Patient has had a recent decline in their functional status and demonstrates the ability to make significant improvements in function in a reasonable and predictable amount of time.     Precautions / Restrictions Precautions Precautions: Fall Precaution Comments: new Rt AKA Restrictions Weight Bearing Restrictions: Yes RLE Weight Bearing: Non weight bearing      Mobility  Bed Mobility               General bed mobility comments: Declined EOB or OOB today, encouraged elevating HOB throughout the day. Attempting bringing HOB up but wincing in pain through right residual limb.    Transfers                        Ambulation/Gait                  Stairs            Wheelchair Mobility    Modified Rankin (Stroke Patients Only)       Balance  Pertinent Vitals/Pain Pain Assessment Pain Assessment: 0-10 Pain Score: 5  Pain Location: right residual limb Pain Descriptors / Indicators: Operative site guarding Pain Intervention(s): Premedicated before session, Monitored during session, Repositioned, Limited activity within patient's tolerance    Home Living Family/patient expects to be discharged to:: Private residence Living Arrangements: Spouse/significant other Available Help at Discharge: Family;Available 24 hours/day Type of Home: House Home Access: Stairs to enter Entrance  Stairs-Rails: Left Entrance Stairs-Number of Steps: 3 Alternate Level Stairs-Number of Steps: 15 Home Layout: 1/2 bath on main level;Bed/bath upstairs;Two level Home Equipment: None Additional Comments: Was independent and still driving, retired prior to BKA in early march    Prior Function Prior Level of Function : Needs assist             Mobility Comments: At AIR after BKA, pt was transferring to [facility] and w/c using slide board with assist to place slide board. Prior to surgery on 3/17, pt independent and driving. ADLs Comments: Needing assist for ADLS at [facility]. Prior to surgery on 3/17, pt independent in self care     Hand Dominance   Dominant Hand: Right    Extremity/Trunk Assessment   Upper Extremity Assessment Upper Extremity Assessment: Defer to OT evaluation    Lower Extremity Assessment Lower Extremity Assessment: RLE deficits/detail;Generalized weakness RLE Deficits / Details: R AKA 4/10, declines to mobilize it due to too much pain; can feel pressure RLE: Unable to fully assess due to pain RLE Sensation: decreased light touch LLE Deficits / Details: heel crack and crack at big toe, with small round area of new skin showing. has a heel protector on, restless type foot movements.    Cervical / Trunk Assessment Cervical / Trunk Assessment: Normal  Communication   Communication: No difficulties  Cognition Arousal/Alertness: Awake/alert Behavior During Therapy: Anxious, WFL for tasks assessed/performed Overall Cognitive Status: Within Functional Limits for tasks assessed                                 General Comments: Pt pleasant, WFL cognitively, at times self-limiting and particular despite education on optimal healing of residual limb. Reports starting therapy "too early in my opinion, it was a waste"        General Comments General comments (skin integrity, edema, etc.): Wife present during session. Appreciative of listening ears from  therapists and communicating necessary info/DME needs to case manager.    Exercises General Exercises - Lower Extremity Ankle Circles/Pumps: AROM, Left, 10 reps, Supine Straight Leg Raises: Left, Supine, 5 reps Other Exercises Other Exercises: Attempted SL bridging through LLE however pt attempting and aborting due to too much pain through residual limb   Assessment/Plan    PT Assessment Patient needs continued PT services  PT Problem List Decreased strength;Decreased activity tolerance;Decreased balance;Decreased mobility;Impaired sensation;Pain;Decreased range of motion;Decreased knowledge of precautions;Decreased skin integrity       PT Treatment Interventions Gait training;DME instruction;Therapeutic activities;Functional mobility training;Balance training;Therapeutic exercise;Patient/family education;Neuromuscular re-education;Wheelchair mobility training;Modalities    PT Goals (Current goals can be found in the Care Plan section)  Acute Rehab PT Goals Patient Stated Goal: manage my pain right now and then go home PT Goal Formulation: With patient/family Time For Goal Achievement: 08/03/21 Potential to Achieve Goals: Fair    Frequency Min 3X/week     Co-evaluation               AM-PAC PT "6 Clicks" Mobility  Outcome Measure Help  needed turning from your back to your side while in a flat bed without using bedrails?: A Little Help needed moving from lying on your back to sitting on the side of a flat bed without using bedrails?: A Lot Help needed moving to and from a bed to a chair (including a wheelchair)?: A Lot Help needed standing up from a chair using your arms (e.g., wheelchair or bedside chair)?: Total Help needed to walk in hospital room?: Total Help needed climbing 3-5 steps with a railing? : Total 6 Click Score: 10    End of Session   Activity Tolerance: Patient limited by pain Patient left: in bed;with call bell/phone within reach;with bed alarm  set;with family/visitor present Nurse Communication: Mobility status;Need for lift equipment PT Visit Diagnosis: Pain;Muscle weakness (generalized) (M62.81) Pain - Right/Left: Right Pain - part of body: Leg    Time: 9629-5284 PT Time Calculation (min) (ACUTE ONLY): 28 min   Charges:   PT Evaluation $PT Eval Moderate Complexity: 1 Mod          Vale Haven, PT, DPT Acute Rehabilitation Services Secure chat preferred Office [phone]     Blake Divine A Rhoda Waldvogel 07/20/2021, 1:07 PM

## 2021-07-20 NOTE — Progress Notes (Signed)
Inpatient Rehabilitation Care Coordinator ?Discharge Note  ? ?Patient Details  ?Name: Douglas [name] ?MRN: [id number] ?Date of Birth: Feb 21, 1951 ? ? ?Discharge location: D/c to surgery ? ?Length of Stay: 15 Days ? ?Discharge activity level: Sup/CGA ? ?Home/community participation: spouse ? ?Patient response TW:KMQKMM Literacy - How often do you need to have someone help you when you read instructions, pamphlets, or other written material from your doctor or pharmacy?: Patient unable to respond ? ?Patient response NO:TRRNHA Isolation - How often do you feel lonely or isolated from those around you?: Patient unable to respond ? ?Services provided included: SW, Pharmacy, TR, CM, RN, SLP, OT, PT, RD, MD ? ?Financial Services:  ?Charity fundraiser Utilized: Private Insurance ?UHC ? ?Choices offered to/list presented to: patient established previously ? ?Follow-up services arranged:  ?Home Health ?Home Health Agency: [facility]  ?  ?  ?HH/DME Requested Agency: [facility] ? ?Patient response to transportation need: ?Is the patient able to respond to transportation needs?: Yes ?In the past 12 months, has lack of transportation kept you from medical appointments or from getting medications?: No ?In the past 12 months, has lack of transportation kept you from meetings, work, or from getting things needed for daily living?: No ? ? ? ?Comments (or additional information): ? ?Patient/Family verbalized understanding of follow-up arrangements:  Yes ? ?Individual responsible for coordination of the follow-up plan: Earlie Server [phone] ? ?Confirmed correct DME delivered: Dyanne Iha 07/20/2021   ? ?Dyanne Iha ?

## 2021-07-20 NOTE — Plan of Care (Signed)
  Problem: Clinical Measurements: Goal: Will remain free from infection Outcome: Progressing Goal: Diagnostic test results will improve Outcome: Progressing   

## 2021-07-20 NOTE — Progress Notes (Signed)
Patient complains of 10/10 pain after given pain medication. More pain medication given. Patient requests to speak to MD about his pain. PA paged and notified. ? ?Daymon Larsen, RN  ?

## 2021-07-20 NOTE — Progress Notes (Signed)
? ?  VASCULAR SURGERY ASSESSMENT & PLAN:  ? ?POD 1 S/P RIGHT AKA: His dressing is dry.  His pain is under good control.  Plan dressing change tomorrow. ? ?DVT PROPHYLAXIS: He is on subcu heparin. ? ?SUBJECTIVE:  ? ?Pain well controlled. ? ?PHYSICAL EXAM:  ? ?Vitals:  ? 07/19/21 1700 07/19/21 1946 07/19/21 2306 07/20/21 0414  ?BP:  (!) 118/59 (!) 114/57 (!) 130/57  ?Pulse:  73 78 75  ?Resp:  '15 20 11  '$ ?Temp: 98.3 ?F (36.8 ?C) 98.1 ?F (36.7 ?C) 97.7 ?F (36.5 ?C) 97.8 ?F (36.6 ?C)  ?TempSrc: Oral Oral Oral Oral  ?SpO2:  97% 97% 96%  ?Weight:      ?Height:      ? ?His dressing is dry. ? ?LABS:  ? ?Lab Results  ?Component Value Date  ? WBC 12.0 (H) 07/20/2021  ? HGB 10.0 (L) 07/20/2021  ? HCT 32.8 (L) 07/20/2021  ? MCV 91.9 07/20/2021  ? PLT 593 (H) 07/20/2021  ? ?Lab Results  ?Component Value Date  ? CREATININE 0.59 (L) 07/20/2021  ? ?Lab Results  ?Component Value Date  ? INR 1.0 06/18/2021  ? ?CBG (last 3)  ?Recent Labs  ?  07/19/21 ?0742 07/19/21 ?1048 07/19/21 ?1119  ?GLUCAP 128* 129* 136*  ? ? ?PROBLEM LIST:   ? ?Principal Problem: ?  PAD (peripheral artery disease) ([place]) ? ? ?CURRENT MEDS:  ? ? docusate sodium  100 mg Oral Daily  ? heparin  5,000 Units Subcutaneous Q8H  ? pantoprazole  40 mg Oral Daily  ? traMADol  50 mg Oral Q6H  ? ? ?Deitra Mayo ?Office: [phone] ?07/20/2021 ? ?

## 2021-07-20 NOTE — TOC Initial Note (Signed)
Transition of Care (TOC) - Initial/Assessment Note  ?Marvetta Gibbons Therapist, sports, BSN ?Transitions of Care ?Unit 4E- RN Case Manager ?See Treatment Team for direct phone #  ? ? ?Patient Details  ?Name: Douglas [name] ?MRN: [id number] ?Date of Birth: 08-15-1950 ? ?Transition of Care (TOC) CM/SW Contact:    ?Dahlia Client, Romeo Rabon, RN ?Phone Number: ?07/20/2021, 12:39 PM ? ?Clinical Narrative:                 ?Pt re-admitted from [place] rehab with non healing Right BKA- s/p revision to Right AKA. POD1, spoke with pt and wife at bedside- per conversation pt was nearing discharge from CIR and plan was to return home w/ wife. Pt has w/c and slide board at bedside- wife will plan to take these home. Pt will also need hospital bed and Left drop arm BSC- MD will need to place DME orders for these - once orders placed for bed and BSC- CM will arrange these with [facility] as per pt and wife request. Wife would prefer to have DME in the home prior to discharge.  ? ?Discussed [place] arrangements- TOC has been notified by [place] that they have been following pt with vascular office protocol for [facility] needs- wife confirms. Latricia Heft will provide [facility] services on discharge.  ? ?Wife/pt requesting ambulance transport home as pt has 4 steps to enter and wife unable to assist him- address, phone # and PCP all confirmed- CM will arrange transport with [facility] on day of discharge.  ? ?TOC to continue to follow for transition of care needs.  ? ?Expected Discharge Plan: [place] ?Barriers to Discharge: Continued Medical Work up ? ? ?Patient Goals and CMS Choice ?Patient states their goals for this hospitalization and ongoing recovery are:: plan is to return home and not go back to [place] rehab ?CMS Medicare.gov Compare Post Acute Care list provided to:: Patient ?Choice offered to / list presented to : Patient, Spouse ? ?Expected Discharge Plan and Services ?Expected Discharge Plan: [place] ?  ?Discharge Planning Services: CM  Consult ?Post Acute Care Choice: Home Health, Durable Medical Equipment ?Living arrangements for the past 2 months: [place] ?                ?DME Arranged: Bedside commode, Hospital bed ?DME Agency: [facility] ?  ?  ?  ?HH Arranged: RN, PT, OT ?[place] Agency: [place] ?Date HH Agency Contacted: 07/20/21 ?Time [place]: [phone] ?Representative spoke with at [place]: Lattie Haw ? ?Prior Living Arrangements/Services ?Living arrangements for the past 2 months: [place] ?Lives with:: Self, Spouse ?Patient language and need for interpreter reviewed:: Yes ?Do you feel safe going back to the place where you live?: Yes      ?Need for Family Participation in Patient Care: Yes (Comment) ?Care giver support system in place?: Yes (comment) ?Current home services: DME (has w/c and slide board that CIR arranged) ?Criminal Activity/Legal Involvement Pertinent to Current Situation/Hospitalization: No - Comment as needed ? ?Activities of Daily Living ?Home Assistive Devices/Equipment: None ?ADL Screening (condition at time of admission) ?Patient's cognitive ability adequate to safely complete daily activities?: Yes ?Is the patient deaf or have difficulty hearing?: No ?Does the patient have difficulty seeing, even when wearing glasses/contacts?: No ?Does the patient have difficulty concentrating, remembering, or making decisions?: No ?Patient able to express need for assistance with ADLs?: Yes ?Does the patient have difficulty dressing or bathing?: No ?Independently performs ADLs?: Yes (  appropriate for developmental age) ?Does the patient have difficulty walking or climbing stairs?: No ?Weakness of Legs: None ?Weakness of Arms/Hands: None ? ?Permission Sought/Granted ?Permission sought to share information with : Customer service manager ?Permission granted to share information with : Yes, Verbal Permission Granted ?   ? Permission granted to share info w AGENCY: [facility]/ [facility] ?   ?    ? ?Emotional Assessment ?Appearance:: Appears stated age ?Attitude/Demeanor/Rapport: Engaged ?Affect (typically observed): Accepting, Appropriate, Pleasant ?Orientation: : Oriented to Self, Oriented to Place, Oriented to  Time, Oriented to Situation ?Alcohol / Substance Use: Not Applicable ?Psych Involvement: No (comment) ? ?Admission diagnosis:  PAD (peripheral artery disease) ([place]) [I73.9] ?Patient Active Problem List  ? Diagnosis Date Noted  ? PAD (peripheral artery disease) ([place])   ? Post-op pain   ? Urine retention   ? Right below-knee amputee ([place]) 07/01/2021  ? Malnutrition of moderate degree 06/28/2021  ? Hypokalemia 06/25/2021  ? Constipation 06/24/2021  ? Physical debility 06/23/2021  ? Coffee ground emesis 06/22/2021  ? Critical limb ischemia of right lower extremity ([place]) 06/18/2021  ? Pacemaker 04/30/2021  ? Elevated troponin   ? Mobitz type 1 second degree AV block   ? CLL (chronic lymphocytic leukemia) ([place]) 01/16/2021  ? NSTEMI (non-ST elevated myocardial infarction) ([place]) 01/16/2021  ? Hypertension 01/16/2021  ? Diabetes mellitus ([place]) 01/16/2021  ? BPH (benign prostatic hyperplasia) 01/16/2021  ? ?PCP:  [name], MD ?Pharmacy:   ?[facility] [address] ?[place] ?[place] [address] ?Phone: [phone] Fax: [phone] ? ?[facility] ?[address] ?[place] [address] ?Phone: [phone] Fax: [phone] ? ? ? ? ?Social Determinants of Health (SDOH) Interventions ?  ? ?Readmission Risk Interventions ? ?  07/01/2021  ?  3:48 PM  ?Readmission Risk Prevention Plan  ?Transportation Screening Complete  ?Medication Review Press photographer) Complete  ?[place] or Home Care Consult Complete  ?SW Recovery Care/Counseling Consult Complete  ?Palliative Care Screening Not Applicable  ?[place] Not Applicable  ? ? ? ?

## 2021-07-20 NOTE — Progress Notes (Signed)
MD: please confirm order for removal of foley catheter POD#2. ? ?Looks like foley was placed in CIR for bladder obstruction/retention. ? ?Thank you, nursing ?

## 2021-07-20 NOTE — Anesthesia Postprocedure Evaluation (Signed)
Anesthesia Post Note ? ?Patient: Douglas [name] ? ?Procedure(s) Performed: AMPUTATION ABOVE KNEE (Right: Knee) ? ?  ? ?Patient location during evaluation: PACU ?Anesthesia Type: General ?Level of consciousness: awake and alert ?Pain management: pain level controlled ?Vital Signs Assessment: post-procedure vital signs reviewed and stable ?Respiratory status: spontaneous breathing, nonlabored ventilation and respiratory function stable ?Cardiovascular status: blood pressure returned to baseline and stable ?Postop Assessment: no apparent nausea or vomiting ?Anesthetic complications: no ? ? ?No notable events documented. ? ?Last Vitals:  ?Vitals:  ? 07/20/21 0414 07/20/21 0800  ?BP: (!) 130/57 (!) 129/58  ?Pulse: 75 88  ?Resp: 11 18  ?Temp: 36.6 ?C 36.6 ?C  ?SpO2: 96% 94%  ?  ?Last Pain:  ?Vitals:  ? 07/20/21 0846  ?TempSrc:   ?PainSc: 10-Worst pain ever  ? ? ?  ?  ?  ?  ?  ?  ? ?Lidia Collum ? ? ? ? ?

## 2021-07-20 NOTE — Progress Notes (Addendum)
Inpatient Rehab Admissions Coordinator:  ? ?I spoke with Pt. And wife regarding potential return to CIR. Pt. Does not wish to return, feels he learned everything he needs to know and does not need to return. Dr. Leeroy Cha, rehab MD who was following on CIR is also on board with Pt. Not returning to CIR.  Pt. Wishes to d/c home with [facility]. CIR will sign off.  ? ?Clemens Catholic, MS, CCC-SLP ?Rehab Admissions Coordinator  ?[phone] (celll) ?[phone] (office) ? ? ?

## 2021-07-20 NOTE — Evaluation (Signed)
Occupational Therapy Evaluation ?Patient Details ?Name: Douglas [name] ?MRN: [id number] ?DOB: 06-Sep-1950 ?Today's Date: 07/20/2021 ? ? ?History of Present Illness Patient is a 71 y/o male who presents from [facility] on 4/10 for Right AKA. 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 PMH includes pacemaker, DM, HTN, CLL.  ? ?Clinical Impression ?  ?Patient is s/p R AKA surgery resulting in functional limitations due to the deficits listed below (see OT problem list). Pt requesting to d/c home with wife. Pt needs bariatric drop arm commode and hospital bed for d/c planning. Pt has w/c with sliding board in room for next session practice. A bari drop arm requesting from portable equipment for therapy session acutely as well.  Patient will benefit from skilled OT acutely to increase independence and safety with ADLS to allow discharge [place]. ?  ?   ? ?Recommendations for follow up therapy are one component of a multi-disciplinary discharge planning process, led by the attending physician.  Recommendations may be updated based on patient status, additional functional criteria and insurance authorization.  ? ?Follow Up Recommendations ? Home health OT  ?  ?Assistance Recommended at Discharge Intermittent Supervision/Assistance  ?Patient can return home with the following A lot of help with bathing/dressing/bathroom;Assistance with cooking/housework;Assistance with feeding;Direct supervision/assist for medications management;Direct supervision/assist for financial management;Assist for transportation ? ?  ?Functional Status Assessment ? Patient has had a recent decline in their functional status and demonstrates the ability to make significant improvements in function in a reasonable and predictable amount of time.  ?Equipment Recommendations ? BSC/3in1 (bariatric commode with drop arm- requested on [facility] from [facility]- was never delivered correctly.)  ?  ?Recommendations for Other Services   ? ? ?  ?Precautions / Restrictions  Precautions ?Precautions: Fall ?Precaution Comments: new Rt AKA ?Other Brace: limb guard from previous surgery in the room ?Restrictions ?Weight Bearing Restrictions: Yes ?RLE Weight Bearing: Non weight bearing  ? ?  ? ?Mobility Bed Mobility ?  ?  ?  ?  ?  ?  ?  ?General bed mobility comments: Declined EOB or OOB today, encouraged elevating HOB throughout the day. Attempting bringing HOB up but wincing in pain through right residual limb. ?  ? ?Transfers ?  ?  ?  ?  ?  ?  ?  ?  ?  ?General transfer comment: personal sliding board present in room for next session ?  ? ?  ?Balance   ?  ?  ?  ?  ?  ?  ?  ?  ?  ?  ?  ?  ?  ?  ?  ?  ?  ?  ?   ? ?ADL either performed or assessed with clinical judgement  ? ?ADL Overall ADL's : Needs assistance/impaired ?Eating/Feeding: Independent;Bed level ?  ?Grooming: Set up;Bed level ?  ?  ?  ?Lower Body Bathing: Maximal assistance ?  ?  ?  ?Lower Body Dressing: Maximal assistance ?  ?  ?  ?  ?  ?  ?  ?  ?General ADL Comments: pt very guarded and expressed feeling that he started therapy too soon last surgery for BKA. the importance of therapy was provided. pt expressed lack of voiding bowels and bariatric BSC requested for room. pt educated that therapy will need to advance next session to prepare for home at min guard level. Pt expressed just needing 24 hours after surgery to get pain controlled.  ? ? ? ?Vision Baseline Vision/History: 1 Wears glasses ?Ability to See  in Adequate Light: 0 Adequate ?Patient Visual Report: Blurring of vision ?Additional Comments: able to read clock , able to read calendar and able to read the call dont fall font without trouble  ?   ?Perception   ?  ?Praxis   ?  ? ?Pertinent Vitals/Pain Pain Assessment ?Pain Assessment: 0-10 ?Pain Score: 5  ?Pain Location: right residual limb ?Pain Descriptors / Indicators: Operative site guarding ?Pain Intervention(s): Premedicated before session, Repositioned, Monitored during session, Limited activity within  patient's tolerance  ? ? ? ?Hand Dominance Right ?  ?Extremity/Trunk Assessment Upper Extremity Assessment ?Upper Extremity Assessment: Overall WFL for tasks assessed ?  ?Lower Extremity Assessment ?Lower Extremity Assessment: Defer to PT evaluation ?RLE Deficits / Details: R AKA 4/10, declines to mobilize it due to too much pain; can feel pressure ?RLE: Unable to fully assess due to pain ?RLE Sensation: decreased light touch ?LLE Deficits / Details: heel crack and crack at big toe, with small round area of new skin showing. has a heel protector on, restless type foot movements. ?  ?Cervical / Trunk Assessment ?Cervical / Trunk Assessment: Normal ?  ?Communication Communication ?Communication: No difficulties ?  ?Cognition Arousal/Alertness: Awake/alert ?Behavior During Therapy: Anxious, WFL for tasks assessed/performed ?Overall Cognitive Status: Within Functional Limits for tasks assessed ?  ?  ?  ?  ?  ?  ?  ?  ?  ?  ?  ?  ?  ?  ?  ?  ?General Comments: Pt pleasant, WFL cognitively, at times self-limiting and particular despite education on optimal healing of residual limb. Reports starting therapy "too early in my opinion, it was a waste" ?  ?  ?General Comments  Wife present during session. Appreciative of listening ears from therapists and communicating necessary info/DME needs to case manager Drue Dun. wife expressed need to be min guard to d/c home. Pt expressed feeling like he already did 2 weeks of therapy. ? ?  ?Exercises Exercises: Other exercises ?General Exercises - Lower Extremity ?Ankle Circles/Pumps: AROM, Left, 10 reps, Supine ?Straight Leg Raises: Left, Supine, 5 reps ?Other Exercises ?Other Exercises: attempted bridging buttock with LLE and pt unabel to completed due to "it hurts this other leg" meanign the R residual limb ?Other Exercises: educated on use of pillow between legs for rolling onto side to help with support of R LE. advised to roll toward L side initially. P ?Other Exercises: pt  educated and advised to have RN staff list times for medicaitons to help with requesting them since they are PRN. pt reports overnight having medications just giving and a pain level of 2 throughout the night. pt reports since morning bedmobility pain has been 5-8 level ?  ?Shoulder Instructions    ? ? ?Home Living Family/patient expects to be discharged to:: Private residence ?Living Arrangements: Spouse/significant other ?Available Help at Discharge: Family;Available 24 hours/day ?Type of Home: House ?Home Access: Stairs to enter ?Entrance Stairs-Number of Steps: 3 ?Entrance Stairs-Rails: Left ?Home Layout: 1/2 bath on main level;Bed/bath upstairs;Two level ?Alternate Level Stairs-Number of Steps: 15 ?Alternate Level Stairs-Rails: Can reach both ?  ?  ?Bathroom Toilet: Standard ?Bathroom Accessibility: Yes ?  ?Home Equipment: None ?  ?Additional Comments: recent admission from CIR following BKA. wife reports must be MIn guard (A) to d/c home ?  ? ?  ?Prior Functioning/Environment Prior Level of Function : Needs assist ?  ?  ?  ?  ?  ?  ?Mobility Comments: At [facility] after BKA, pt was transferring to [facility]  and w/c using slide board with assist to place slide board. Prior to surgery on 3/17, pt independent and driving. ?ADLs Comments: Needing assist for ADLS at [facility]. Prior to surgery on 3/17, pt independent in self care ?  ? ?  ?  ?OT Problem List: Decreased strength;Decreased range of motion;Decreased activity tolerance;Impaired balance (sitting and/or standing);Decreased knowledge of use of DME or AE;Decreased knowledge of precautions;Pain ?  ?   ?OT Treatment/Interventions: Self-care/ADL training;Therapeutic exercise;Energy conservation;DME and/or AE instruction;Manual therapy;Therapeutic activities;Patient/family education;Balance training;Modalities  ?  ?OT Goals(Current goals can be found in the care plan section) Acute Rehab OT Goals ?Patient Stated Goal: to go home after admission ?OT Goal Formulation: With  patient ?Time For Goal Achievement: 08/03/21 ?Potential to Achieve Goals: Good  ?OT Frequency: Min 2X/week ?  ? ?Co-evaluation   ?  ?  ?  ?  ? ?  ?AM-PAC OT "6 Clicks" Daily Activity     ?Outcome Measure Help from another person eatin

## 2021-07-20 NOTE — Progress Notes (Signed)
?  Progress Note ? ? ? ?07/20/2021 ?7:21 AM ?1 Day Post-Op ? ?Subjective: no complaints. Pain overall manageable ? ? ?Vitals:  ? 07/19/21 2306 07/20/21 0414  ?BP: (!) 114/57 (!) 130/57  ?Pulse: 78 75  ?Resp: 20 11  ?Temp: 97.7 ?F (36.5 ?C) 97.8 ?F (36.6 ?C)  ?SpO2: 97% 96%  ? ?Physical Exam: ?Cardiac:  regular ?Lungs:  non labored ?Incisions:  Right AKA dressings c/d/i ?Neurologic: alert and oriented ? ?CBC ?   ?Component Value Date/Time  ? WBC 12.0 (H) 07/20/2021 0111  ? RBC 3.57 (L) 07/20/2021 0111  ? HGB 10.0 (L) 07/20/2021 0111  ? HGB 14.9 05/04/2021 0752  ? HCT 32.8 (L) 07/20/2021 0111  ? PLT 593 (H) 07/20/2021 0111  ? PLT 352 05/04/2021 0752  ? MCV 91.9 07/20/2021 0111  ? MCH 28.0 07/20/2021 0111  ? MCHC 30.5 07/20/2021 0111  ? RDW 16.8 (H) 07/20/2021 0111  ? LYMPHSABS 1.8 07/06/2021 0712  ? MONOABS 0.7 07/06/2021 0981  ? EOSABS 0.0 07/06/2021 1914  ? BASOSABS 0.1 07/06/2021 7829  ? ? ?BMET ?   ?Component Value Date/Time  ? NA 132 (L) 07/20/2021 0111  ? K 4.5 07/20/2021 0111  ? CL 101 07/20/2021 0111  ? CO2 25 07/20/2021 0111  ? GLUCOSE 113 (H) 07/20/2021 0111  ? BUN 15 07/20/2021 0111  ? CREATININE 0.59 (L) 07/20/2021 0111  ? CREATININE 0.94 05/04/2021 0752  ? CALCIUM 8.4 (L) 07/20/2021 0111  ? GFRNONAA >60 07/20/2021 0111  ? GFRNONAA >60 05/04/2021 0752  ? GFRAA >60 01/08/2020 0810  ? ? ?INR ?   ?Component Value Date/Time  ? INR 1.0 06/18/2021 1044  ? ? ? ?Intake/Output Summary (Last 24 hours) at 07/20/2021 0721 ?Last data filed at 07/20/2021 0416 ?Gross per 24 hour  ?Intake 1097.22 ml  ?Output 2140 ml  ?Net -1042.78 ml  ? ? ? ?Assessment/Plan:  71 y.o. male is s/p right AKA 1 Day Post-Op  ? ?Pain well controlled ?Right AKA dressings will be taken down tomorrow ?Dressings c/d/I ?Hemodynamically stable ?PT/OT eval with recs ?Pt concerned he is unable to get in touch with his wife. Have asked RN to assist with trying to reach her ? ? ?[name], PA-C ?[facility] ?[phone] ?07/20/2021 ?7:21 AM ?

## 2021-07-21 LAB — BASIC METABOLIC PANEL
Anion gap: 6 (ref 5–15)
BUN: 12 mg/dL (ref 8–23)
CO2: 29 mmol/L (ref 22–32)
Calcium: 8.5 mg/dL — ABNORMAL LOW (ref 8.9–10.3)
Chloride: 95 mmol/L — ABNORMAL LOW (ref 98–111)
Creatinine, Ser: 0.45 mg/dL — ABNORMAL LOW (ref 0.61–1.24)
GFR, Estimated: >60 mL/min (ref >60)
Glucose, Bld: 126 mg/dL — ABNORMAL HIGH (ref 70–99)
Potassium: 4.3 mmol/L (ref 3.5–5.1)
Sodium: 130 mmol/L — ABNORMAL LOW (ref 135–145)

## 2021-07-21 LAB — CBC
HCT: 33.7 % — ABNORMAL LOW (ref 39.0–52.0)
Hemoglobin: 10.4 g/dL — ABNORMAL LOW (ref 13.0–17.0)
MCH: 28 pg (ref 26.0–34.0)
MCHC: 30.9 g/dL (ref 30.0–36.0)
MCV: 90.8 fL (ref 80.0–100.0)
Platelets: 607 10*3/uL — ABNORMAL HIGH (ref 150–400)
RBC: 3.71 MIL/uL — ABNORMAL LOW (ref 4.22–5.81)
RDW: 16.8 % — ABNORMAL HIGH (ref 11.5–15.5)
WBC: 9.5 10*3/uL (ref 4.0–10.5)
nRBC: 0 % (ref 0.0–0.2)

## 2021-07-21 MED ORDER — CHLORHEXIDINE GLUCONATE CLOTH 2 % EX PADS
6.0000 | MEDICATED_PAD | Freq: Every day | CUTANEOUS | Status: DC
  Administered 2021-07-21 – 2021-07-23 (×3): 6 via TOPICAL

## 2021-07-21 NOTE — TOC Progression Note (Addendum)
Transition of Care (TOC) - Progression Note  ?Marvetta Gibbons Therapist, sports, BSN ?Transitions of Care ?Unit 4E- RN Case Manager ?See Treatment Team for direct phone #  ? ? ?Patient Details  ?Name: Douglas [name] ?MRN: [id number] ?Date of Birth: 12-Dec-1950 ? ?Transition of Care (TOC) CM/SW Contact  ?Dawayne Patricia, RN ?Phone Number: ?07/21/2021, 10:48 AM ? ?Clinical Narrative:    ?DME orders have been placed this am, CM called [employer] to refer for DME needs -hospital bed and BCS-drop arm-bariatric. [facility] to process and call wife to arrange for delivery either today or tomorrow to the home. ?CM spoke with pt and wife at bedside to update on DME- wife will await call from [employer] for DME delivery arrangements.  ?TOC to continue to follow.  ? ? ?Expected Discharge Plan: [place] ?Barriers to Discharge: Continued Medical Work up ? ?Expected Discharge Plan and Services ?Expected Discharge Plan: [place] ?  ?Discharge Planning Services: CM Consult ?Post Acute Care Choice: Home Health, Durable Medical Equipment ?Living arrangements for the past 2 months: [place] ?                ?DME Arranged: Bedside commode, Hospital bed ?DME Agency: [facility] ?  ?  ?  ?HH Arranged: RN, PT, OT ?[place] Agency: [place] ?Date HH Agency Contacted: 07/20/21 ?Time [place]: 1324 ?Representative spoke with at [place]: Lattie Haw ? ? ?Social Determinants of Health (SDOH) Interventions ?  ? ?Readmission Risk Interventions ? ?  07/01/2021  ?  3:48 PM  ?Readmission Risk Prevention Plan  ?Transportation Screening Complete  ?Medication Review Press photographer) Complete  ?[place] or Home Care Consult Complete  ?SW Recovery Care/Counseling Consult Complete  ?Palliative Care Screening Not Applicable  ?[place] Not Applicable  ? ? ?

## 2021-07-21 NOTE — Progress Notes (Addendum)
Physical Therapy Treatment ?Patient Details ?Name: Douglas [name] ?MRN: [id number] ?DOB: 11-Apr-1951 ?Today's Date: 07/21/2021 ? ? ?History of Present Illness Patient is a 71 y/o male who presents from [facility] on 4/10 for Right AKA. 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 PMH includes pacemaker, DM, HTN, CLL. ? ?  ?PT Comments  ? ? Pt received in supine, anxious to attempt due to fear of increased pain with mobility but agreeable to participate in bed-level and EOB activity with max cues/encouragement. Pt needing mod cues for pursed-lip breathing and redirection to task due to pt high pain score during mobility, pt agreeable to pain meds at beginning of session (given by RN) although reports pain 2-3/10 at rest, discussed with pt use of cryotherapy in conjunction with pain meds for pain/edema mgmt in order to maximize session for functional mobility progress. Pt needing up to +2 modA for bed mobility and up to +2 maxA for lateral seated scooting toward [facility] to simulate slide board transfer. Pt not agreeable to lateral scooting OOB despite max encouragement. Noted pt tachy to 121 bpm with sitting EOB and orthostatic (BP 146/68 supine then BP 113/66 seated EOB and pt c/o nausea, also had pain meds prior to sitting up). Pt reports minimal food intake past couple of days, encouraged him to eat to prevent dehydration and ensure energy for wound healing/functional mobility tasks. Pt continues to benefit from PT services to progress toward functional mobility goals.   ?Recommendations for follow up therapy are one component of a multi-disciplinary discharge planning process, led by the attending physician.  Recommendations may be updated based on patient status, additional functional criteria and insurance authorization. ? ?Follow Up Recommendations ? Home health PT ?  ?  ?Assistance Recommended at Discharge Frequent or constant Supervision/Assistance  ?Patient can return home with the following Two people to help with walking  and/or transfers;A lot of help with bathing/dressing/bathroom;Assistance with cooking/housework;Assist for transportation;Help with stairs or ramp for entrance ?  ?Equipment Recommendations ? BSC/3in1;Hospital bed;Other (comment) (bariatric drop arm BSC)  ?  ?Recommendations for Other Services   ? ? ?  ?Precautions / Restrictions Precautions ?Precautions: Fall ?Precaution Comments: new Rt AKA ?Restrictions ?Weight Bearing Restrictions: Yes ?RLE Weight Bearing: Non weight bearing  ?  ? ?Mobility ? Bed Mobility ?Overal bed mobility: Needs Assistance ?Bed Mobility: Rolling, Sidelying to Sit, Sit to Sidelying ?Rolling: Min assist, +2 for safety/equipment ?Sidelying to sit: Mod assist, +2 for physical assistance ?  ?  ?Sit to sidelying: Min assist, +2 for physical assistance ?General bed mobility comments: pt utilizing RUE hooked into therapist forearm for support with log roll trunk raise and pushing with LUE on bed rail, once upright needing hand over hand assist for improved LUE positioning for balance/stability, second therapist assisting for trunk raising/stability from behind each transfer; Champ for trunk rise/lowering assist and tactile/verbal cues for LLE positioning; pt anxious and frequently requesting rest breaks midway through transfer, he benefits from cues for pursed-lip breathing and short rest breaks ?  ? ?Transfers ?Overall transfer level: Needs assistance ?  ?  ?  ?  ?  ?  ?  ? Lateral/Scoot Transfers: Max assist, +2 physical assistance ?General transfer comment: initiated seated scoot training along EOB, pt needing max multimodal cues for LLE placement and BUE positioning/body mechanics; maxA for transfer pad assist for scooting toward his stronger L side x3 trials toward HOB. Pt initially deferring to attempt but agreeable with encouragement along EOB, pt refusing OOB scoot transfer to WC/drop arm  recliner due to increased pain ?  ? ? ?  ?Balance Overall balance assessment: Needs  assistance ?Sitting-balance support: Feet supported, Bilateral upper extremity supported ?Sitting balance-Leahy Scale: Fair ?Sitting balance - Comments: pt reliant on BUE support due to RLE pain/anxiety ?  ?  ?  ? ?  ?Cognition Arousal/Alertness: Awake/alert ?Behavior During Therapy: Anxious, WFL for tasks assessed/performed ?Overall Cognitive Status: Within Functional Limits for tasks assessed ?  ?  ?  ?  ?  ?  ?  ?General Comments: Pt alert, WFL cognitively, at times self-limiting and particular (noted anxiety due to fear of pain) despite education on optimal healing of residual limb. Pt states "my early rehab stay I couldn't do anything due to pain" although he was getting OOB to chair multiple days prior to going to CIR so pt may be having memory issues related to hospital stay/sequence of events. Pt reoriented to previous therapies (with this therapist prior to CIR admission) and reasons for initiating EOB/OOB mobility attempts and ultimately agreeable after discussion. ?  ?  ? ?  ?Exercises Other Exercises ?Other Exercises: attempted bridging buttock with LLE with partial hip raise achieved ?Other Exercises: reviewed supine RLE exercises at bed-level to complete between sessions including hip abduction/flexion and hip extension, discussion on benefits of prone positioning in bed once RLE pain allows him to attempt ? ?  ?General Comments General comments (skin integrity, edema, etc.): BP 146/68 (88) supine, HR 105 bpm; BP 113/66 sitting EOB, HR 120 bpm with complaints of nausea and BP 132/74 after return to supine ?  ?  ? ?Pertinent Vitals/Pain Pain Assessment ?Pain Assessment: Faces ?Faces Pain Scale: Hurts even more ?Pain Location: right residual limb ?Pain Descriptors / Indicators: Operative site guarding, Grimacing, Discomfort ?Pain Intervention(s): Limited activity within patient's tolerance, Monitored during session, Repositioned, RN gave pain meds during session, Ice applied (pain meds given right before  EOB transfer)  ? ? ? ?PT Goals (current goals can now be found in the care plan section) Acute Rehab PT Goals ?Patient Stated Goal: less pain and increased mobility and then go home instead of rehab ?PT Goal Formulation: With patient/family ?Time For Goal Achievement: 08/03/21 ?Progress towards PT goals: Progressing toward goals (slow progress, pain/anxiety limiting) ? ?  ?Frequency ? ? ? Min 3X/week ? ? ? ?  ?PT Plan Current plan remains appropriate (pending progress)  ? ? ?Co-evaluation PT/OT/SLP Co-Evaluation/Treatment: Yes ?Reason for Co-Treatment: For patient/therapist safety;To address functional/ADL transfers ?PT goals addressed during session: Mobility/safety with mobility;Balance;Strengthening/ROM ? ?  ? ?  ?AM-PAC PT "6 Clicks" Mobility   ?Outcome Measure ? Help needed turning from your back to your side while in a flat bed without using bedrails?: A Little ?Help needed moving from lying on your back to sitting on the side of a flat bed without using bedrails?: A Lot ?Help needed moving to and from a bed to a chair (including a wheelchair)?: Total ?Help needed standing up from a chair using your arms (e.g., wheelchair or bedside chair)?: Total ?Help needed to walk in hospital room?: Total ?Help needed climbing 3-5 steps with a railing? : Total ?6 Click Score: 9 ? ?  ?End of Session   ?Activity Tolerance: Patient limited by pain ?Patient left: in bed;with call bell/phone within reach;with bed alarm set ?Nurse Communication: Mobility status;Other (comment) (pt may need increased pain meds on a consistent schedule for improved participation; pt can do more than he thinks but needs encouragement.) ?PT Visit Diagnosis: Pain;Muscle weakness (generalized) (M62.81) ?Pain -  Right/Left: Right ?Pain - part of body: Leg ?  ? ? ?Time: 8257-4935 ?PT Time Calculation (min) (ACUTE ONLY): 30 min ? ?Charges:  $Therapeutic Activity: 8-22 mins          ?          ? ?Bonnie Roig P., PTA ?Acute Rehabilitation Services ?Secure Chat  Preferred 9a-5:30pm ?Office: [phone]  ? ? ?Remingtyn Depaola M Jalecia Leon ?07/21/2021, 2:05 PM ? ?

## 2021-07-21 NOTE — Progress Notes (Signed)
? ?  VASCULAR SURGERY ASSESSMENT & PLAN:  ? ?POD 2 S/P RIGHT AKA: I changed his dressing this morning and the wound looks fine.  Begin daily dressing changes.   ?  ?DVT PROPHYLAXIS: He is on subcu heparin. ?  ? ?SUBJECTIVE:  ? ?Pain is well controlled. ? ?PHYSICAL EXAM:  ? ?Vitals:  ? 07/20/21 1212 07/20/21 2020 07/20/21 2327 07/21/21 0502  ?BP: 133/64 134/61 133/65 131/73  ?Pulse: 87 92 78 86  ?Resp: '18 15 13 12  '$ ?Temp: 98.1 ?F (36.7 ?C) 98.4 ?F (36.9 ?C)  98.5 ?F (36.9 ?C)  ?TempSrc: Oral Oral  Oral  ?SpO2: 96% 90% 92% 95%  ?Weight:      ?Height:      ? ?His right AKA looks fine. ? ? ? ?LABS:  ? ?Lab Results  ?Component Value Date  ? WBC 9.5 07/21/2021  ? HGB 10.4 (L) 07/21/2021  ? HCT 33.7 (L) 07/21/2021  ? MCV 90.8 07/21/2021  ? PLT 607 (H) 07/21/2021  ? ?Lab Results  ?Component Value Date  ? CREATININE 0.45 (L) 07/21/2021  ? ?CBG (last 3)  ?Recent Labs  ?  07/19/21 ?1048 07/19/21 ?1119 07/20/21 ?1223  ?GLUCAP 129* 136* 161*  ? ? ?PROBLEM LIST:   ? ?Principal Problem: ?  PAD (peripheral artery disease) ([place]) ? ? ?CURRENT MEDS:  ? ? Chlorhexidine Gluconate Cloth  6 each Topical Daily  ? docusate sodium  100 mg Oral Daily  ? heparin  5,000 Units Subcutaneous Q8H  ? pantoprazole  40 mg Oral Daily  ? traMADol  50 mg Oral Q6H  ? ? ?Deitra Mayo ?Office: [phone] ?07/21/2021 ? ?

## 2021-07-21 NOTE — Progress Notes (Signed)
Occupational Therapy Treatment ?Patient Details ?Name: Douglas [name] ?MRN: [id number] ?DOB: 1951/03/23 ?Today's Date: 07/21/2021 ? ? ?History of present illness Patient is a 71 y/o male who presents from [facility] on 4/10 for Right AKA. 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 PMH includes pacemaker, DM, HTN, CLL. ?  ?OT comments ? Patient received in supine and apprehensive about participating with therapy due to RLE residual leg pain. Patient informed on benefits of therapy and movement and agreed to OT/PT treatment. LB dressing addressed while supine with patient rolling in bed and assisting with pulling up on left side and required increased time due to pain. Patient required assistance of 2 to get to EOB and stated he was unable to progress.  Lateral scooting performed from EOB and patient returned to supine. Patient would benefit from further OT services to further address LB dressing and functional transfers with slide board. Acute OT to continue to follow.   ? ?Recommendations for follow up therapy are one component of a multi-disciplinary discharge planning process, led by the attending physician.  Recommendations may be updated based on patient status, additional functional criteria and insurance authorization. ?   ?Follow Up Recommendations ? Home health OT  ?  ?Assistance Recommended at Discharge Intermittent Supervision/Assistance  ?Patient can return home with the following ? A lot of help with bathing/dressing/bathroom;Assistance with cooking/housework;Assistance with feeding;Direct supervision/assist for medications management;Direct supervision/assist for financial management;Assist for transportation ?  ?Equipment Recommendations ? BSC/3in1  ?  ?Recommendations for Other Services   ? ?  ?Precautions / Restrictions Precautions ?Precautions: Fall ?Precaution Comments: new Rt AKA ?Restrictions ?Weight Bearing Restrictions: Yes ?RLE Weight Bearing: Non weight bearing  ? ? ?  ? ?Mobility Bed Mobility ?Overal  bed mobility: Needs Assistance ?Bed Mobility: Rolling, Sidelying to Sit, Sit to Sidelying ?Rolling: Min assist, +2 for safety/equipment ?Sidelying to sit: Mod assist, +2 for physical assistance ?  ?  ?Sit to sidelying: Min assist, +2 for physical assistance ?General bed mobility comments: pt utilizing RUE hooked into therapist forearm for support with log roll trunk raise and pushing with LUE on bed rail, once upright needing hand over hand assist for improved LUE positioning for balance/stability, second therapist assisting for trunk raising/stability from behind each transfer; [place] for trunk rise/lowering assist and tactile/verbal cues for LLE positioning; pt anxious and frequently requesting rest breaks midway through transfer, he benefits from cues for pursed-lip breathing and short rest breaks ?  ? ?Transfers ?Overall transfer level: Needs assistance ?  ?  ?  ?  ?  ?  ?  ? Lateral/Scoot Transfers: Max assist, +2 physical assistance ?General transfer comment: lateral scooting performed while seated on EOB to increase training for slide board transfers ?  ?  ?Balance Overall balance assessment: Needs assistance ?Sitting-balance support: Feet supported, Bilateral upper extremity supported ?Sitting balance-Leahy Scale: Fair ?Sitting balance - Comments: pt reliant on BUE support due to RLE pain/anxiety ?  ?  ?  ?  ?  ?  ?  ?  ?  ?  ?  ?  ?  ?  ?  ?   ? ?ADL either performed or assessed with clinical judgement  ? ?ADL   ?  ?  ?  ?  ?  ?  ?  ?  ?  ?  ?Lower Body Dressing: Maximal assistance;Bed level ?Lower Body Dressing Details (indicate cue type and reason): max assist to donn sock over LLE, thread legs into clothing and pulling up with patient assisting with  rolling and pulling up on left side while side lying ?  ?  ?  ?  ?  ?  ?  ?General ADL Comments: increased time to perform LB dressing due to pain and patient fearful of increased pain ?  ? ?Extremity/Trunk Assessment   ?  ?  ?  ?  ?  ? ?Vision   ?  ?   ?Perception   ?  ?Praxis   ?  ? ?Cognition Arousal/Alertness: Awake/alert ?Behavior During Therapy: Anxious, WFL for tasks assessed/performed ?Overall Cognitive Status: Within Functional Limits for tasks assessed ?  ?  ?  ?  ?  ?  ?  ?  ?  ?  ?  ?  ?  ?  ?  ?  ?General Comments: concerned over increased pain at RLE residual limb. ?  ?  ?   ?Exercises   ? ?  ?Shoulder Instructions   ? ? ?  ?General Comments BP 146/68 (88) supine, HR 105 bpm; BP 113/66 sitting EOB, HR 120 bpm with complaints of nausea and BP 132/74 after return to supine  ? ? ?Pertinent Vitals/ Pain       Pain Assessment ?Pain Assessment: Faces ?Faces Pain Scale: Hurts even more ?Pain Location: right residual limb ?Pain Descriptors / Indicators: Operative site guarding, Grimacing, Discomfort ?Pain Intervention(s): Limited activity within patient's tolerance, Monitored during session, RN gave pain meds during session, Repositioned ? ?Home Living   ?  ?  ?  ?  ?  ?  ?  ?  ?  ?  ?  ?  ?  ?  ?  ?  ?  ?  ? ?  ?Prior Functioning/Environment    ?  ?  ?  ?   ? ?Frequency ? Min 2X/week  ? ? ? ? ?  ?Progress Toward Goals ? ?OT Goals(current goals can now be found in the care plan section) ? Progress towards OT goals: Progressing toward goals ? ?Acute Rehab OT Goals ?Patient Stated Goal: go home ?OT Goal Formulation: With patient ?Time For Goal Achievement: 08/03/21 ?Potential to Achieve Goals: Good ?ADL Goals ?Pt Will Perform Grooming: standing;Independently ?Pt Will Perform Lower Body Bathing: sitting/lateral leans;with adaptive equipment;sit to/from stand;Independently ?Pt Will Perform Upper Body Dressing: with modified independence;sitting ?Pt Will Perform Lower Body Dressing: with min guard assist;with adaptive equipment;sitting/lateral leans ?Pt Will Transfer to Toilet: with min assist;bedside commode;with transfer board ?Additional ADL Goal #1: pt will complete bed to w/c transfer with sliding board min (A)  ?Plan Discharge plan remains appropriate    ? ?Co-evaluation ? ? ? PT/OT/SLP Co-Evaluation/Treatment: Yes ?Reason for Co-Treatment: For patient/therapist safety;To address functional/ADL transfers ?  ?OT goals addressed during session: ADL's and self-care ?  ? ?  ?AM-PAC OT "6 Clicks" Daily Activity     ?Outcome Measure ? ? Help from another person eating meals?: None ?Help from another person taking care of personal grooming?: A Little ?Help from another person toileting, which includes using toliet, bedpan, or urinal?: A Lot ?Help from another person bathing (including washing, rinsing, drying)?: A Lot ?Help from another person to put on and taking off regular upper body clothing?: A Little ?Help from another person to put on and taking off regular lower body clothing?: A Lot ?6 Click Score: 16 ? ?  ?End of Session Equipment Utilized During Treatment: Gait belt ? ?OT Visit Diagnosis: Unsteadiness on feet (R26.81) ?Pain - Right/Left: Right ?Pain - part of body: Leg ?  ?Activity Tolerance Patient limited  by pain ?  ?Patient Left in bed;with call bell/phone within reach;with bed alarm set ?  ?Nurse Communication Mobility status ?  ? ?   ? ?Time: 8144-8185 ?OT Time Calculation (min): 30 min ? ?Charges: OT General Charges ?$OT Visit: 1 Visit ?OT Treatments ?$Self Care/Home Management : 8-22 mins ? ?Lodema Hong, OTA ?Acute Rehabilitation Services  ?Pager [phone] ?Office [phone] ? ? ?[place] ?07/21/2021, 1:19 PM ?

## 2021-07-21 NOTE — Progress Notes (Signed)
Patient inquiring about his home medication Imbruvica. This medication is not on his MAR to give. However pharmacy has this medication stored for patient as his home med. MD paged. Awaiting response. ? ?Daymon Larsen, RN  ?

## 2021-07-22 MED ORDER — IBRUTINIB 420 MG PO TABS
420.0000 mg | ORAL_TABLET | Freq: Every day | ORAL | Status: DC
  Administered 2021-07-22 – 2021-07-23 (×2): 420 mg via ORAL
  Filled 2021-07-22 (×3): qty 1

## 2021-07-22 NOTE — Progress Notes (Signed)
Physical Therapy Treatment ?Patient Details ?Name: Douglas [name] ?MRN: [id number] ?DOB: [birth year] ?Today's Date: 07/22/2021 ? ? ?History of Present Illness Patient is a 71 y/o male who presents from [facility] on 4/10 for Right AKA. 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 PMH includes pacemaker, DM, HTN, CLL. ? ?  ?PT Comments  ? ? Pt received in supine, agreeable to therapy session and with good participation and tolerance for transfer training. Pt able to perform lateral seated scoot from bed>recliner with slide board and minA (+2 for safety/due to pt anxiety) and stood from recliner<>Stedy with up to +2 maxA. Pt tachycardic to 140 bpm with standing and c/o feeling "clammy" afterward, BP stable 122/70 (85). Plan to see pt next date (before noon if possible) for further transfer training, would be good to have caretaker/spouse present for instruction to ensure she is comfortable with assist level. Pt continues to benefit from PT services to progress toward functional mobility goals.   ?Recommendations for follow up therapy are one component of a multi-disciplinary discharge planning process, led by the attending physician.  Recommendations may be updated based on patient status, additional functional criteria and insurance authorization. ? ?Follow Up Recommendations ? Home health PT ?  ?  ?Assistance Recommended at Discharge Frequent or constant Supervision/Assistance  ?Patient can return home with the following Assistance with cooking/housework;Assist for transportation;Help with stairs or ramp for entrance;A lot of help with bathing/dressing/bathroom;A lot of help with walking and/or transfers ?  ?Equipment Recommendations ? BSC/3in1;Hospital bed;Other (comment) (bariatric drop arm BSC)  ?  ?Recommendations for Other Services   ? ? ?  ?Precautions / Restrictions Precautions ?Precautions: Fall ?Precaution Comments: new Rt AKA ?Restrictions ?Weight Bearing Restrictions: Yes ?RLE Weight Bearing: Non weight bearing  ?   ? ?Mobility ? Bed Mobility ?Overal bed mobility: Needs Assistance ?Bed Mobility: Rolling, Sidelying to Sit, Sit to Sidelying ?Rolling: Min assist, +2 for safety/equipment ?Sidelying to sit: +2 for physical assistance, Min assist ?  ?  ?  ?General bed mobility comments: pt heavily reliant on bed rail ?  ? ?Transfers ?Overall transfer level: Needs assistance ?Equipment used: Ambulation equipment used, Sliding board ?Transfers: Sit to/from Stand, Bed to chair/wheelchair/BSC ?Sit to Stand: Max assist, +2 physical assistance, Mod assist ?  ?  ?  ?  ? Lateral/Scoot Transfers: Min assist, +2 safety/equipment, With slide board ?General transfer comment: Bed>drop arm recliner via seated scoot to L side with minA. Pt also able to stand from recliner>Stedy with maxA +2 lift assist and from elevated Stedy seat with +2 modA lift assist. ?  ? ?Ambulation/Gait ?  ?  ?  ?  ?  ?  ?  ?  ? ? ?Stairs ?  ?  ?  ?  ?  ? ? ?Wheelchair Mobility ?  ? ?Modified Rankin (Stroke Patients Only) ?  ? ? ?  ?Balance Overall balance assessment: Needs assistance ?Sitting-balance support: Feet supported, Bilateral upper extremity supported ?Sitting balance-Leahy Scale: Fair ?Sitting balance - Comments: U UE support no LOB, pt tending to use BUE support due to RLE pain ?  ?Standing balance support: Bilateral upper extremity supported, Reliant on assistive device for balance ?Standing balance-Leahy Scale: Poor ?Standing balance comment: no buckling with Stedy frame support, pt stood ~1 minute at a time but deferred longer due to tachycardia ?  ?  ?  ?  ?  ?  ?  ?  ?  ?  ?  ?  ? ?  ?Cognition Arousal/Alertness: Awake/alert ?Behavior During  Therapy: Anxious, WFL for tasks assessed/performed ?Overall Cognitive Status: Within Functional Limits for tasks assessed ?  ?  ?  ?  ?  ?  ?  ?  ?  ?  ?  ?  ?  ?  ?  ?  ?General Comments: Slow to initiate but needing fewer cues for technique this session, still needs heavy encouragement and cues for pursed-lip  breathing for mobility progression. Fair safety awareness with SB transfers. ?  ?  ? ?  ?Exercises Amputee Exercises ?Hip Extension: AROM, Right, 5 reps, Standing (ROM limited due to pain) ? ?  ?General Comments General comments (skin integrity, edema, etc.): BP 122/70 (85) post-transfer to recliner, pt sitting; HR 98  bpm resting and up to 140 bpm with standing, HR 120 bpm with scoot transfer ?  ?  ? ?Pertinent Vitals/Pain Pain Assessment ?Pain Assessment: Faces ?Faces Pain Scale: Hurts even more ?Pain Location: right residual limb with mobility and repositioning ?Pain Descriptors / Indicators: Operative site guarding, Grimacing, Discomfort ?Pain Intervention(s): Monitored during session, Repositioned, Patient requesting pain meds-RN notified, Ice applied (pain meds due in 1 hour)  ? ? ?Home Living   ?  ?  ?  ?  ?  ?  ?  ?  ?  ?   ?  ?Prior Function    ?  ?  ?   ? ?PT Goals (current goals can now be found in the care plan section) Acute Rehab PT Goals ?Patient Stated Goal: less pain and increased mobility and then go home instead of rehab ?PT Goal Formulation: With patient/family ?Time For Goal Achievement: 08/03/21 ?Progress towards PT goals: Progressing toward goals ? ?  ?Frequency ? ? ? Min 3X/week ? ? ? ?  ?PT Plan Current plan remains appropriate  ? ? ?Co-evaluation PT/OT/SLP Co-Evaluation/Treatment: Yes ?Reason for Co-Treatment: To address functional/ADL transfers;For patient/therapist safety ?PT goals addressed during session: Mobility/safety with mobility;Balance;Proper use of DME;Strengthening/ROM ?  ?  ? ?  ?AM-PAC PT "6 Clicks" Mobility   ?Outcome Measure ? Help needed turning from your back to your side while in a flat bed without using bedrails?: A Little ?Help needed moving from lying on your back to sitting on the side of a flat bed without using bedrails?: A Little ?Help needed moving to and from a bed to a chair (including a wheelchair)?: A Little ?Help needed standing up from a chair using your  arms (e.g., wheelchair or bedside chair)?: Total ?Help needed to walk in hospital room?: Total ?Help needed climbing 3-5 steps with a railing? : Total ?6 Click Score: 12 ? ?  ?End of Session Equipment Utilized During Treatment: Gait belt ?Activity Tolerance: Patient limited by pain;Patient tolerated treatment well ?Patient left: in chair;with call bell/phone within reach ?Nurse Communication: Mobility status;Patient requests pain meds;Other (comment) (technique for safe return via slide board discussed with RN and mobility specialist) ?PT Visit Diagnosis: Pain;Muscle weakness (generalized) (M62.81) ?Pain - Right/Left: Right ?Pain - part of body: Leg ?  ? ? ?Time: 4098-1191 ?PT Time Calculation (min) (ACUTE ONLY): 28 min ? ?Charges:  $Therapeutic Activity: 8-22 mins          ?          ? ?Mariadelosang Wynns P., PTA ?Acute Rehabilitation Services ?Secure Chat Preferred 9a-5:30pm ?Office: [phone]  ? ? ?Keeli Roberg M Nayel Purdy ?07/22/2021, 12:57 PM ? ?

## 2021-07-22 NOTE — Plan of Care (Signed)
?  Problem: Health Behavior/Discharge Planning: ?Goal: Ability to manage health-related needs will improve ?Outcome: Progressing ?  ?Problem: Clinical Measurements: ?Goal: Will remain free from infection ?Outcome: Progressing ?  ?Problem: Activity: ?Goal: Risk for activity intolerance will decrease ?Outcome: Progressing ?  ?Problem: Elimination: ?Goal: Will not experience complications related to bowel motility ?Outcome: Progressing ?Goal: Will not experience complications related to urinary retention ?Outcome: Progressing ?  ?

## 2021-07-22 NOTE — Discharge Instructions (Signed)
[facility]  Discharge instructions  Lower Extremity Amputation  Please refer to the following instruction for your post-procedure care. Your surgeon or physician assistant will discuss any changes with you.  Activity  You are encouraged to walk as much as you can. You can slowly return to normal activities during the month after your surgery. Avoid strenuous activity and heavy lifting until your doctor tells you it's OK. Avoid activities such as vacuuming or swinging a golf club. Do not drive until your doctor give the OK and you are no longer taking prescription pain medications. It is also normal to have difficulty with sleep habits, eating and bowel movement after surgery. These will go away with time.  Bathing/Showering  Shower daily after you go home. Do not soak in a bathtub, hot tub, or swim until the incision heals completely.  Incision Care  Clean your incision with mild soap and water. Shower every day. Pat the area dry with a clean towel. You do not need a bandage unless otherwise instructed. Do not apply any ointments or creams to your incision. If you have open wounds you will be instructed how to care for them or a visiting nurse may be arranged for you. If you have staples or sutures along your incision they will be removed at your post-op appointment. You may have skin glue on your incision. Do not peel it off. It will come off on its own in about one week.  Diet  Resume your normal diet. There are no special food restrictions following this procedure. A low fat/ low cholesterol diet is recommended for all patients with vascular disease. In order to heal from your surgery, it is CRITICAL to get adequate nutrition. Your body requires vitamins, minerals, and protein. Vegetables are the best source of vitamins and minerals. Vegetables also provide the perfect balance of protein. Processed food has little nutritional value, so try to avoid  this.  Medications  Resume taking all your medications unless your doctor or physician assistant tells you not to. If your incision is causing pain, you may take over-the-counter pain relievers such as acetaminophen (Tylenol). If you were prescribed a stronger pain medication, please aware these medication can cause nausea and constipation. Prevent nausea by taking the medication with a snack or meal. Avoid constipation by drinking plenty of fluids and eating foods with high amount of fiber, such as fruits, vegetables, and grains. Take Colace 100 mg (an over-the-counter stool softener) twice a day as needed for constipation.  Do not take Tylenol if you are taking prescription pain medications.  Follow Up  Our office will schedule a follow up appointment 4 weeks following discharge.  Please call us immediately for any of the following conditions  Increase pain, redness, warmth, or drainage (pus) from your incision site(s) Fever of 101 degree or higher The swelling in your leg with the amputation suddenly worsens and becomes more painful than when you were in the hospital  Leg swelling is common after amputation surgery.  The swelling should improve over a few months following surgery. To improve the swelling, you may elevate your legs above the level of your heart while you are sitting or resting. Your surgeon or physician assistant may ask you to apply an ACE wrap or wear compression (TED) stockings to help to reduce swelling.  Reduce your risk of vascular disease  Stop smoking. If you would like help call [facility] at 1-800-QUIT-NOW (1-800-784-8669) or [place] at 336-586-4000.  Manage   your cholesterol Maintain a desired weight Control your diabetes weight Control your diabetes Keep your blood pressure down  If you have any questions, please call the office at [phone] 

## 2021-07-22 NOTE — Progress Notes (Signed)
Occupational Therapy Treatment ?Patient Details ?Name: Douglas [name] ?MRN: [id number] ?DOB: [birth year] ?Today's Date: 07/22/2021 ? ? ?History of present illness Patient is a 71 y/o male who presents from [facility] on 4/10 for Right AKA. 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 PMH includes pacemaker, DM, HTN, CLL. ?  ?OT comments ? Patient received in supine and eager to participate with PT/OT. LB dressing performed with patient assisting to donn scrub pants with rolling in bed and pulling up while supine. Total assist to donn sock and shoe on LLE. Patient able to perform left lateral scoot transfer with slide board with patient recalling technique and positioning board and min assist +2.  Patient making good gains with transfers using slide board and will benefit from further OT with HHOT to increase independence with functional transfers.  ? ?Recommendations for follow up therapy are one component of a multi-disciplinary discharge planning process, led by the attending physician.  Recommendations may be updated based on patient status, additional functional criteria and insurance authorization. ?   ?Follow Up Recommendations ? Home health OT  ?  ?Assistance Recommended at Discharge Intermittent Supervision/Assistance  ?Patient can return home with the following ? A lot of help with bathing/dressing/bathroom;Assistance with cooking/housework;Assistance with feeding;Direct supervision/assist for medications management;Direct supervision/assist for financial management;Assist for transportation ?  ?Equipment Recommendations ? BSC/3in1  ?  ?Recommendations for Other Services   ? ?  ?Precautions / Restrictions Precautions ?Precautions: Fall ?Precaution Comments: new Rt AKA ?Restrictions ?Weight Bearing Restrictions: Yes ?RLE Weight Bearing: Non weight bearing  ? ? ?  ? ?Mobility Bed Mobility ?Overal bed mobility: Needs Assistance ?Bed Mobility: Rolling, Sidelying to Sit, Sit to Sidelying ?Rolling: Min assist, +2 for  safety/equipment ?Sidelying to sit: +2 for physical assistance, Min assist ?  ?  ?  ?General bed mobility comments: pt heavily reliant on bed rail ?  ? ?Transfers ?Overall transfer level: Needs assistance ?Equipment used: Ambulation equipment used, Sliding board ?Transfers: Sit to/from Stand, Bed to chair/wheelchair/BSC ?Sit to Stand: Max assist, +2 physical assistance, Mod assist ?  ?  ?  ?  ? Lateral/Scoot Transfers: Min assist, +2 safety/equipment, With slide board ?General transfer comment: Bed>drop arm recliner via seated scoot to L side with minA. Pt also able to stand from recliner>Stedy with maxA +2 lift assist and from elevated Stedy seat with +2 modA lift assist. ?  ?  ?Balance Overall balance assessment: Needs assistance ?Sitting-balance support: Feet supported, Bilateral upper extremity supported ?Sitting balance-Leahy Scale: Fair ?Sitting balance - Comments: able to sit on EOB with BUE support ?  ?Standing balance support: Bilateral upper extremity supported, Reliant on assistive device for balance ?Standing balance-Leahy Scale: Poor ?Standing balance comment: no buckling with Stedy frame support, pt stood ~1 minute at a time but deferred longer due to tachycardia ?  ?  ?  ?  ?  ?  ?  ?  ?  ?  ?  ?   ? ?ADL either performed or assessed with clinical judgement  ? ?ADL Overall ADL's : Needs assistance/impaired ?  ?  ?  ?  ?  ?  ?  ?  ?  ?  ?Lower Body Dressing: Maximal assistance;Bed level ?Lower Body Dressing Details (indicate cue type and reason): donned paper scrubs bottoms in supine with patient rolling to assist with pulling up. Total assist to donn left sock and shoe ?Toilet Transfer: Min guard;+2 for physical assistance;Transfer board ?Toilet Transfer Details (indicate cue type and reason): simulated to recliner ?  ?  ?  ?  ?  ?  General ADL Comments: patient with less pain and increased participation ?  ? ?Extremity/Trunk Assessment   ?  ?  ?  ?  ?  ? ?Vision   ?  ?  ?Perception   ?  ?Praxis   ?   ? ?Cognition Arousal/Alertness: Awake/alert ?Behavior During Therapy: Anxious, WFL for tasks assessed/performed ?Overall Cognitive Status: Within Functional Limits for tasks assessed ?  ?  ?  ?  ?  ?  ?  ?  ?  ?  ?  ?  ?  ?  ?  ?  ?General Comments: Slow to initiate but needing fewer cues for technique this session, still needs heavy encouragement and cues for pursed-lip breathing for mobility progression. Fair safety awareness with SB transfers. ?  ?  ?   ?Exercises   ? ?  ?Shoulder Instructions   ? ? ?  ?General Comments BP 122/70 (85) post-transfer to recliner, pt sitting; HR 98  bpm resting and up to 140 bpm with standing, HR 120 bpm with scoot transfer  ? ? ?Pertinent Vitals/ Pain       Pain Assessment ?Pain Assessment: Faces ?Faces Pain Scale: Hurts even more ?Pain Location: right residual limb with mobility and repositioning ?Pain Descriptors / Indicators: Operative site guarding, Grimacing, Discomfort ?Pain Intervention(s): Monitored during session, Repositioned ? ?Home Living   ?  ?  ?  ?  ?  ?  ?  ?  ?  ?  ?  ?  ?  ?  ?  ?  ?  ?  ? ?  ?Prior Functioning/Environment    ?  ?  ?  ?   ? ?Frequency ? Min 2X/week  ? ? ? ? ?  ?Progress Toward Goals ? ?OT Goals(current goals can now be found in the care plan section) ? Progress towards OT goals: Progressing toward goals ? ?Acute Rehab OT Goals ?Patient Stated Goal: go home ?OT Goal Formulation: With patient ?Time For Goal Achievement: 08/03/21 ?Potential to Achieve Goals: Good ?ADL Goals ?Pt Will Perform Grooming: standing;Independently ?Pt Will Perform Lower Body Bathing: sitting/lateral leans;with adaptive equipment;sit to/from stand;Independently ?Pt Will Perform Upper Body Dressing: with modified independence;sitting ?Pt Will Perform Lower Body Dressing: with min guard assist;with adaptive equipment;sitting/lateral leans ?Pt Will Transfer to Toilet: with min assist;bedside commode;with transfer board ?Additional ADL Goal #1: pt will complete bed to w/c  transfer with sliding board min (A)  ?Plan Discharge plan remains appropriate   ? ?Co-evaluation ? ? ? PT/OT/SLP Co-Evaluation/Treatment: Yes ?Reason for Co-Treatment: For patient/therapist safety;To address functional/ADL transfers ?PT goals addressed during session: Mobility/safety with mobility;Balance;Proper use of DME;Strengthening/ROM ?OT goals addressed during session: ADL's and self-care ?  ? ?  ?AM-PAC OT "6 Clicks" Daily Activity     ?Outcome Measure ? ? Help from another person eating meals?: None ?Help from another person taking care of personal grooming?: A Little ?Help from another person toileting, which includes using toliet, bedpan, or urinal?: A Lot ?Help from another person bathing (including washing, rinsing, drying)?: A Lot ?Help from another person to put on and taking off regular upper body clothing?: A Little ?Help from another person to put on and taking off regular lower body clothing?: A Lot ?6 Click Score: 16 ? ?  ?End of Session Equipment Utilized During Treatment: Gait belt;Other (comment) (slide board) ? ?OT Visit Diagnosis: Unsteadiness on feet (R26.81) ?Pain - Right/Left: Right ?Pain - part of body: Leg ?  ?Activity Tolerance Patient tolerated treatment well ?  ?Patient  Left in chair;with call bell/phone within reach ?  ?Nurse Communication Mobility status ?  ? ?   ? ?Time: 9038-3338 ?OT Time Calculation (min): 28 min ? ?Charges: OT General Charges ?$OT Visit: 1 Visit ?OT Treatments ?$Self Care/Home Management : 8-22 mins ? ?Lodema Hong, OTA ?Acute Rehabilitation Services  ?Pager [phone] ?Office [phone] ? ? ?[place] ?07/22/2021, 2:04 PM ?

## 2021-07-22 NOTE — Progress Notes (Addendum)
?  Progress Note ? ?VASCULAR SURGERY ASSESSMENT & PLAN:  ? ?POD 3 S/P RIGHT AKA: His amputation site looks fine.  Continue daily dressing changes.   ?  ?DVT PROPHYLAXIS: He is on subcu heparin. ?  ?Gae Gallop, MD ?8:01 AM ? ? ?07/22/2021 ?7:13 AM ?3 Days Post-Op ? ?Subjective:  no major complaints. Pain overall well controlled ? ? ?Vitals:  ? 07/21/21 2347 07/22/21 0359  ?BP: (!) 150/69 (!) 141/76  ?Pulse: 90 93  ?Resp: 16 16  ?Temp: 98.1 ?F (36.7 ?C) 98.7 ?F (37.1 ?C)  ?SpO2: 96% 96%  ? ?Physical Exam: ?Cardiac:  regular ?Lungs:  non labored ?Incisions:  right AKA well appearing, viable flaps ? ? ?Neurologic: alert and oriented ? ?CBC ?   ?Component Value Date/Time  ? WBC 9.5 07/21/2021 0150  ? RBC 3.71 (L) 07/21/2021 0150  ? HGB 10.4 (L) 07/21/2021 0150  ? HGB 14.9 05/04/2021 0752  ? HCT 33.7 (L) 07/21/2021 0150  ? PLT 607 (H) 07/21/2021 0150  ? PLT 352 05/04/2021 0752  ? MCV 90.8 07/21/2021 0150  ? MCH 28.0 07/21/2021 0150  ? MCHC 30.9 07/21/2021 0150  ? RDW 16.8 (H) 07/21/2021 0150  ? LYMPHSABS 1.8 07/06/2021 0712  ? MONOABS 0.7 07/06/2021 0349  ? EOSABS 0.0 07/06/2021 1791  ? BASOSABS 0.1 07/06/2021 5056  ? ? ?BMET ?   ?Component Value Date/Time  ? NA 130 (L) 07/21/2021 0150  ? K 4.3 07/21/2021 0150  ? CL 95 (L) 07/21/2021 0150  ? CO2 29 07/21/2021 0150  ? GLUCOSE 126 (H) 07/21/2021 0150  ? BUN 12 07/21/2021 0150  ? CREATININE 0.45 (L) 07/21/2021 0150  ? CREATININE 0.94 05/04/2021 0752  ? CALCIUM 8.5 (L) 07/21/2021 0150  ? GFRNONAA >60 07/21/2021 0150  ? GFRNONAA >60 05/04/2021 0752  ? GFRAA >60 01/08/2020 0810  ? ? ?INR ?   ?Component Value Date/Time  ? INR 1.0 06/18/2021 1044  ? ? ? ?Intake/Output Summary (Last 24 hours) at 07/22/2021 0713 ?Last data filed at 07/21/2021 1200 ?Gross per 24 hour  ?Intake 480 ml  ?Output --  ?Net 480 ml  ? ? ? ?Assessment/Plan:  71 y.o. male is s/p Right AKA 3 Days Post-Op  ? ?Right AKA well appearing ?Pain control PRN ?Home Meds restarted ?PT/OT recommending HH PT/ OT. Order  placed ?DME needs ordered ?Will order stump shrinker today ?Plan is to d/c home when ready ? ? ?[name], PA-C ?[facility] ?[phone] ?07/22/2021 ?7:13 AM ?

## 2021-07-22 NOTE — Progress Notes (Signed)
Orthopedic Tech Progress Note ?Patient Details:  ?Douglas [name] ?[birth year] ?[id number] ?Called in order to [facility] for Douglas [name] ?Patient ID: Douglas [name], male   DOB: Oct 20, 1950, 71 y.o.   MRN: [id number] ? ?Douglas [name] ?07/22/2021, 9:47 AM ? ?

## 2021-07-23 ENCOUNTER — Telehealth: Payer: Self-pay

## 2021-07-23 LAB — CUP PACEART REMOTE DEVICE CHECK
Battery Remaining Longevity: 147 mo
Battery Voltage: 3.15 V
Brady Statistic AP VP Percent: 4.24 %
Brady Statistic AP VS Percent: 0.01 %
Brady Statistic AS VP Percent: 86.86 %
Brady Statistic AS VS Percent: 8.89 %
Brady Statistic RA Percent Paced: 6.14 %
Brady Statistic RV Percent Paced: 91.1 %
Date Time Interrogation Session: 20230414132656
Implantable Lead Implant Date: 20221011
Implantable Lead Implant Date: 20221011
Implantable Lead Location: 753859
Implantable Lead Location: 753860
Implantable Lead Model: 3830
Implantable Lead Model: 5076
Implantable Pulse Generator Implant Date: 20221011
Lead Channel Impedance Value: 361 Ohm
Lead Channel Impedance Value: 380 Ohm
Lead Channel Impedance Value: 456 Ohm
Lead Channel Impedance Value: 494 Ohm
Lead Channel Pacing Threshold Amplitude: 0.625 V
Lead Channel Pacing Threshold Amplitude: 0.625 V
Lead Channel Pacing Threshold Pulse Width: 0.4 ms
Lead Channel Pacing Threshold Pulse Width: 0.4 ms
Lead Channel Sensing Intrinsic Amplitude: 3.125 mV
Lead Channel Sensing Intrinsic Amplitude: 3.125 mV
Lead Channel Sensing Intrinsic Amplitude: 4 mV
Lead Channel Sensing Intrinsic Amplitude: 4 mV
Lead Channel Setting Pacing Amplitude: 1.5 V
Lead Channel Setting Pacing Amplitude: 2 V
Lead Channel Setting Pacing Pulse Width: 0.4 ms
Lead Channel Setting Sensing Sensitivity: 0.9 mV

## 2021-07-23 NOTE — Progress Notes (Signed)
Pt refused dressing change. Pt requested his compression wrap to be placed on instead. RN educated pt. ?

## 2021-07-23 NOTE — TOC Transition Note (Signed)
Transition of Care (TOC) - CM/SW Discharge Note ?Marvetta Gibbons Therapist, sports, BSN ?Transitions of Care ?Unit 4E- RN Case Manager ?See Treatment Team for direct phone #  ? ? ?Patient Details  ?Name: Douglas [name] ?MRN: [id number] ?Date of Birth: 1950-07-06 ? ?Transition of Care (TOC) CM/SW Contact:  ?Dahlia Client, Romeo Rabon, RN ?Phone Number: ?07/23/2021, 11:22 AM ? ? ?Clinical Narrative:    ?Pt stable for transition home today, CM spoke with pt and wife at the bedside. [facility]. Wheelchair and slide board are at the bedside and they will take those home today.  ?Pt to transport home via [facility]- address has been confirmed.  ? ?HH has been set up with [facility]- wife states she has spoken with Lattie Haw at [facility] and they will plan to do start of care visit first of next week. CM called Lattie Haw to notify of discharge today- per Lattie Haw they will try to see pt on Monday.  ? ?Wife had question about Urology f/u- per pt/wife pt was to go home w/ foley from [facility] and f/u with urolgy but they do not know with whom. Call made to [facility] and spoke with CSW Margreta Journey- regarding referral to Urology. Per Dr Naaman Plummer note - referral was made to outpt Urology- but Margreta Journey reports she can not tell which provider. Margreta Journey states to let the pt know outpt referral to Urology will be f/u once pt is discharged and someone should contact pt regarding appointment.  ?All other questions addressed with pt and wife.  ? ?1100- [facility] called for Transport- paperwork placed on chart- ETA for transport per [facility] about 30 min, bedside RN updated ? ? ?Final next level of care: [place] ?Barriers to Discharge: Barriers Resolved ? ? ?Patient Goals and CMS Choice ?Patient states their goals for this hospitalization and ongoing recovery are:: plan is to return home and not go back to [facility] ?CMS Medicare.gov Compare Post Acute Care list provided to:: Patient ?Choice offered to / list  presented to : Patient, Spouse ? ?Discharge Placement ?  ?           ? Home w/ HH ?  ?  ?  ? ?Discharge Plan and Services ?  ?Discharge Planning Services: CM Consult ?Post Acute Care Choice: Home Health, Durable Medical Equipment          ?DME Arranged: Bedside commode, Hospital bed ?DME Agency: [employer] ?Date DME Agency Contacted: 07/20/21 ?  ?Representative spoke with at DME Agency: Maudie Mercury ?HH Arranged: RN, PT, OT ?[place] Agency: [place] ?Date HH Agency Contacted: 07/20/21 ?Time [place]: [phone] ?Representative spoke with at [place]: Lattie Haw ? ?Social Determinants of Health (SDOH) Interventions ?  ? ? ?Readmission Risk Interventions ? ?  07/23/2021  ? 11:22 AM 07/01/2021  ?  3:48 PM  ?Readmission Risk Prevention Plan  ?Transportation Screening Complete Complete  ?PCP or Specialist Appt within 3-5 Days Complete   ?[place] or Home Care Consult Complete   ?Social Work Consult for [place] Planning/Counseling Complete   ?Palliative Care Screening Not Applicable   ?Medication Review Press photographer) Complete Complete  ?[place] or Home Care Consult  Complete  ?SW Recovery Care/Counseling Consult  Complete  ?Palliative Care Screening  Not Applicable  ?[place]  Not Applicable  ? ? ? ? ? ?

## 2021-07-23 NOTE — Telephone Encounter (Signed)
TC from pt's wife sharing that pt has been discharged from hospital after 5 week hospitalization. Pt had above knee amputation and and wife advised that pt will not be able to travel to [facility] for Lab and MD visit on 08/03/21. She is requesting that "these appointments be pushed back three months until he gets his prosthesis." She would like pt to continue getting prescriptions for Imbruvica.  ? ?Routed to provider. ?

## 2021-07-23 NOTE — Telephone Encounter (Signed)
TC to patient's wife. Spoke at length about potential services that home health will be able to provide related to mobility and issues with stairs in home. Wife also states that they have sold their home and are moving into a ground level apartment -ADA equipped - in May that will help pt be able to get back and forth to hospital. After discussion, patient agreeable to rescheduling apt to late May. ?

## 2021-07-23 NOTE — Progress Notes (Signed)
Physical Therapy Treatment ?Patient Details ?Name: Douglas [name] ?MRN: [id number] ?DOB: Oct 29, 1950 ?Today's Date: 07/23/2021 ? ? ?History of Present Illness Patient is a 71 y/o male who presents from [facility] on 4/10 for Right AKA. 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 PMH includes pacemaker, DM, HTN, CLL. ? ?  ?PT Comments  ? ? Pt received in supine, agreeable to therapy session, spouse Earlie Server present and receptive to instruction for assisting pt with slide board transfers and bed mobility. Pt needing +2 minA for transition to dangle EOB but limited due to R residual limb pain and unable to progress to slide board transfers. PTA able to demo SB transfer technique/body mechanics for pt spouse along with guarding positions. Pt asking about belted limb shrinker device donning, reviewed this with pt however he reports PA planning to come to room prior to DC to properly adjust/cut this for him, so kept it halfway donned (covering limb but not folded back over) so PA can properly fit/adjust it, vascular PA notified via [place] that he is ready to review this. Reviewed wheelchair adjustment, positioning and safety with pt/spouse and they were receptive to instruction. Pt continues to benefit from PT services to progress toward functional mobility goals. Pt plans to transport home via [facility] due to stairs (no plan for ramp, pt moving in 1 month to single level entry home).  ?Recommendations for follow up therapy are one component of a multi-disciplinary discharge planning process, led by the attending physician.  Recommendations may be updated based on patient status, additional functional criteria and insurance authorization. ? ?Follow Up Recommendations ? Home health PT ?  ?  ?Assistance Recommended at Discharge Frequent or constant Supervision/Assistance  ?Patient can return home with the following Assistance with cooking/housework;Assist for transportation;Help with stairs or ramp for entrance;A lot of help with  bathing/dressing/bathroom;A lot of help with walking and/or transfers ?  ?Equipment Recommendations ? BSC/3in1;Hospital bed;Other (comment) (bariatric drop arm BSC)  ?  ?Recommendations for Other Services   ? ? ?  ?Precautions / Restrictions Precautions ?Precautions: Fall ?Precaution Comments: new Rt AKA ?Restrictions ?Weight Bearing Restrictions: Yes ?RLE Weight Bearing: Non weight bearing  ?  ? ?Mobility ? Bed Mobility ?Overal bed mobility: Needs Assistance ?Bed Mobility: Rolling, Sidelying to Sit, Sit to Sidelying ?Rolling: Min assist, +2 for safety/equipment ?Sidelying to sit: +2 for physical assistance, Min assist, HOB elevated ?  ?  ?Sit to sidelying: Min assist, +2 for physical assistance, HOB elevated ?General bed mobility comments: pt heavily reliant on bed rail, anxious and requiring increased time to perform ?  ? ?Transfers ?  ?  ?  ?  ?  ?  ?  ?  ?  ?General transfer comment: pt unable to tolerate due to pain ?  ? ? ? ?  ?Balance Overall balance assessment: Needs assistance ?Sitting-balance support: Feet supported, Bilateral upper extremity supported (1 foot support) ?Sitting balance-Leahy Scale: Poor ?Sitting balance - Comments: pain limiting tolerance; L lean to offload ?  ?  ?  ?  ?  ?  ?  ?  ?  ?  ?  ?  ?  ?  ?  ?  ? ?  ?Cognition Arousal/Alertness: Awake/alert ?Behavior During Therapy: Anxious, WFL for tasks assessed/performed ?Overall Cognitive Status: Within Functional Limits for tasks assessed ?  ?  ?  ?  ?  ?  ?  ?  ?  ?  ?  ?  ?  ?  ?  ?  ?General Comments:  Pt needs heavy encouragement and cues for pursed-lip breathing for mobility progression. Poor tolerance due to pain. ?  ?  ? ?  ?Exercises   ? ?  ?General Comments General comments (skin integrity, edema, etc.): VSS on RA; incision staples intact no drainage; reviewed donning of compression belted shrinker but pt reports PA is coming to properly adjust/fit it, PA notified he is expecting this, partially donned for him and wrap not folded  back over until PA comes to cut/adjust ?  ?  ? ?Pertinent Vitals/Pain Pain Assessment ?Pain Assessment: Faces ?Faces Pain Scale: Hurts whole lot ?Pain Location: right residual limb with mobility and repositioning ?Pain Descriptors / Indicators: Operative site guarding, Grimacing, Discomfort, Moaning, Aching ?Pain Intervention(s): Limited activity within patient's tolerance, Monitored during session, Repositioned, Patient requesting pain meds-RN notified (pt defers ice pack)  ? ? ? ?PT Goals (current goals can now be found in the care plan section) Acute Rehab PT Goals ?Patient Stated Goal: less pain and increased mobility and then go home instead of rehab ?PT Goal Formulation: With patient/family ?Time For Goal Achievement: 08/03/21 ?Progress towards PT goals: Progressing toward goals ? ?  ?Frequency ? ? ? Min 3X/week ? ? ? ?  ?PT Plan Current plan remains appropriate  ? ? ?   ?AM-PAC PT "6 Clicks" Mobility   ?Outcome Measure ? Help needed turning from your back to your side while in a flat bed without using bedrails?: A Little ?Help needed moving from lying on your back to sitting on the side of a flat bed without using bedrails?: A Lot ?Help needed moving to and from a bed to a chair (including a wheelchair)?: A Little ?Help needed standing up from a chair using your arms (e.g., wheelchair or bedside chair)?: Total ?Help needed to walk in hospital room?: Total ?Help needed climbing 3-5 steps with a railing? : Total ?6 Click Score: 11 ? ?  ?End of Session   ?Activity Tolerance: Patient limited by pain ?Patient left: in bed;with call bell/phone within reach;with family/visitor present;Other (comment);with nursing/sitter in room (RN entering room to discuss foley) ?Nurse Communication: Mobility status;Patient requests pain meds;Other (comment) (pt requesting PA come to properly fit/adjust compression shrinker) ?PT Visit Diagnosis: Pain;Muscle weakness (generalized) (M62.81) ?Pain - Right/Left: Right ?Pain - part of  body: Leg ?  ? ? ?Time: 1572-6203 ?PT Time Calculation (min) (ACUTE ONLY): 40 min ? ?Charges:  $Therapeutic Activity: 23-37 mins ?$Wheel Chair Management: 8-22 mins          ?          ? ?Jaxton Casale P., PTA ?Acute Rehabilitation Services ?Secure Chat Preferred 9a-5:30pm ?Office: [phone]  ? ? ?Glendal Cassaday M Mirabel Ahlgren ?07/23/2021, 11:13 AM ? ?

## 2021-07-23 NOTE — Progress Notes (Signed)
Patient discharging home with assist of wife. IV removed without complications. Tele removed and CCMD notified. Discharge instructions given and medication administration discussed. Foley being left in for retention. Patient will have urology follow up after discharge. Verified with PA and case management.  ?Douglas [name]  ?

## 2021-07-26 ENCOUNTER — Telehealth: Payer: Self-pay | Admitting: Oncology

## 2021-07-26 ENCOUNTER — Telehealth: Payer: Self-pay | Admitting: *Deleted

## 2021-07-26 NOTE — Telephone Encounter (Signed)
Per 4/14 in basket called and spoke to pt about appointments.  Pt confirmed appointments.  ?

## 2021-07-26 NOTE — Telephone Encounter (Signed)
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 She is worried it has been too long. Because of his prostate problems replacing foley is problematic. It looks like referral was placed 07/05/21 by Silvestre Mesi but it was not specified to which urology and has not been addressed. I have spoke to April and she will send to [employer] with asap request. I have given the number to [facility] to his wife. ?

## 2021-07-26 NOTE — Discharge Summary (Signed)
?Discharge Summary ? ?Patient ID: ?Douglas [name] ?[id number] ?71 y.o. ?1950-10-10 ? ?Admit date: 07/19/2021 ? ?Discharge date and time: 07/23/2021 12:33 PM  ? ?Admitting Physician: Angelia Mould, MD  ? ?Discharge Physician: same ? ?Admission Diagnoses: PAD (peripheral artery disease) ([place]) [I73.9] ? ?Discharge Diagnoses: samd ? ?Admission Condition: fair ? ?Discharged Condition: fair ? ?Indication for Admission: post op ? ?Hospital Course: 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  He was discharged to inpatient rehabilitation.  During his inpatient rehabilitation stay the right below the knee amputation failed.  He was readmitted to [facility] and brought to the operating room on 07/19/2021 and underwent conversion to right above-the-knee amputation by Dr. Scot Dock.  The remainder of his hospital stay was uncomplicated.  TOC arranged home health physical therapy.  He also will follow-up with urology as an outpatient and be sent home with a Foley catheter.  He will follow-up with [facility] in about 4 weeks for staple removal.  He was discharged home in stable condition. ? ?Consults: None ? ?Treatments: surgery: Right above-the-knee amputation by Dr. Scot Dock on 07/19/2021 ? ?Discharge Exam: See progress note 07/22/21 ?Vitals:  ? 07/23/21 1017 07/23/21 1223  ?BP: (!) 141/85 (!) 152/72  ?Pulse: 100   ?Resp: 16   ?Temp: 98.4 ?F (36.9 ?C)   ?SpO2: 98%   ? ? ? ?Disposition: Discharge disposition: 01-Home or Self Care ? ? ? ? ? ? ?Patient Instructions:  ?Allergies as of 07/23/2021   ?Not on File ?  ? ?  ?Medication List  ?  ? ?TAKE these medications   ? ?albuterol 108 (90 Base) MCG/ACT inhaler ?Commonly known as: VENTOLIN HFA ?Inhale 2 puffs into the lungs 4 (four) times daily. ?  ?amLODipine 10 MG tablet ?Commonly known as: NORVASC ?Take 1 tablet (10 mg total) by mouth daily. ?  ?aspirin 81 MG EC tablet ?Take 1  tablet (81 mg total) by mouth daily. Swallow whole. ?  ?docusate sodium 100 MG capsule ?Commonly known as: COLACE ?Take 1 capsule (100 mg total) by mouth 2 (two) times daily. ?  ?finasteride 5 MG tablet ?Commonly known as: PROSCAR ?Take 1 tablet (5 mg total) by mouth daily. ?  ?gabapentin 300 MG capsule ?Commonly known as: NEURONTIN ?Take 1 capsule (300 mg total) by mouth at bedtime. ?  ?Imbruvica 420 MG tablet ?Generic drug: ibrutinib ?TAKE 1 TABLET BY MOUTH ONCE  DAILY WITH A FULL GLASS OF WATER ?What changed: See the new instructions. ?  ?irbesartan 300 MG tablet ?Commonly known as: AVAPRO ?Take 1 tablet (300 mg total) by mouth daily. ?  ?melatonin 5 MG Tabs ?Take 1 tablet (5 mg total) by mouth at bedtime. ?  ?methocarbamol 500 MG tablet ?Commonly known as: ROBAXIN ?Take 1 tablet (500 mg total) by mouth 3 (three) times daily. ?  ?metoprolol tartrate 25 MG tablet ?Commonly known as: LOPRESSOR ?Take 1 tablet (25 mg total) by mouth every 8 (eight) hours. ?  ?montelukast 10 MG tablet ?Commonly known as: SINGULAIR ?Take 1 tablet (10 mg total) by mouth daily as needed (allergies). ?  ?multivitamin with minerals Tabs tablet ?Take 1 tablet by mouth daily. ?  ?oxyCODONE 5 MG immediate release tablet ?Commonly known as: Oxy IR/ROXICODONE ?Take 1 tablet (5 mg total) by mouth every 4 (four) hours as needed for moderate pain. ?  ?oxymetazoline 0.05 % nasal spray ?Commonly known as: AFRIN ?Place 1 spray into both nostrils  2 (two) times daily as needed for congestion. ?  ?pantoprazole 40 MG tablet ?Commonly known as: PROTONIX ?Take 1 tablet (40 mg total) by mouth 2 (two) times daily. ?  ?polyethylene glycol 17 g packet ?Commonly known as: MIRALAX / GLYCOLAX ?Take 17 g by mouth 2 (two) times daily. ?  ?rosuvastatin 20 MG tablet ?Commonly known as: CRESTOR ?Take 1 tablet (20 mg total) by mouth daily. ?  ?terazosin 5 MG capsule ?Commonly known as: HYTRIN ?Take 3 capsules (15 mg total) by mouth at bedtime. ?  ?vitamin C 1000 MG  tablet ?Take 1 tablet (1,000 mg total) by mouth daily. ?  ? ?  ? ?Activity: activity as tolerated ?Diet: regular diet ?Wound Care: keep wound clean and dry ? ?Follow-up with [facility] in 3 weeks. ? ?Signed: ?Dagoberto Ligas, PA-C ?07/26/2021 ?8:00 AM ?[facility] Office: [phone] ? ?

## 2021-07-27 ENCOUNTER — Telehealth: Payer: Self-pay

## 2021-07-27 NOTE — Telephone Encounter (Signed)
Patient is refusing to schedule an appointment for pus in his incision site.  Would rather have Home Health come to his home.  Home health was contacted yesterday and a verbal order was given to [place]. ?

## 2021-07-27 NOTE — Telephone Encounter (Signed)
Per Marlaine Hind, PT with [facility], pt has pus in his incision.  Just continue dry dressing?  Per [place], [place], bring pt in this Wed or Thurs.  Labs are not needed.  We will call to schedule the appt. ?

## 2021-07-28 ENCOUNTER — Telehealth: Payer: Self-pay

## 2021-07-28 NOTE — Telephone Encounter (Signed)
Received call from Nurse Nira Conn at [facility], requesting the use of calcium alginate to slow healing R medial thigh surgical incision d/t drainage and slough. Stated that this is what she would recommend to help encourage healing. Gave verbal Ok and requested documentation to be faxed so that MD could sign.    ?

## 2021-08-03 ENCOUNTER — Inpatient Hospital Stay (HOSPITAL_COMMUNITY)
Admission: EM | Admit: 2021-08-03 | Discharge: 2021-08-10 | DRG: 271 | Disposition: A | Discharge status: Home Health | Payer: 59 | Attending: Vascular Surgery | Admitting: Vascular Surgery

## 2021-08-03 ENCOUNTER — Inpatient Hospital Stay: Payer: 59 | Admitting: Oncology

## 2021-08-03 ENCOUNTER — Inpatient Hospital Stay: Payer: 59

## 2021-08-03 DIAGNOSIS — T8789 Other complications of amputation stump: Secondary | ICD-10-CM | POA: Diagnosis present

## 2021-08-03 DIAGNOSIS — Z801 Family history of malignant neoplasm of trachea, bronchus and lung: Secondary | ICD-10-CM | POA: Diagnosis not present

## 2021-08-03 DIAGNOSIS — N5089 Other specified disorders of the male genital organs: Secondary | ICD-10-CM | POA: Diagnosis not present

## 2021-08-03 DIAGNOSIS — I358 Other nonrheumatic aortic valve disorders: Secondary | ICD-10-CM | POA: Diagnosis present

## 2021-08-03 DIAGNOSIS — Z7982 Long term (current) use of aspirin: Secondary | ICD-10-CM

## 2021-08-03 DIAGNOSIS — E1151 Type 2 diabetes mellitus with diabetic peripheral angiopathy without gangrene: Secondary | ICD-10-CM | POA: Diagnosis present

## 2021-08-03 DIAGNOSIS — Z856 Personal history of leukemia: Secondary | ICD-10-CM | POA: Diagnosis not present

## 2021-08-03 DIAGNOSIS — L8990 Pressure ulcer of unspecified site, unspecified stage: Secondary | ICD-10-CM | POA: Insufficient documentation

## 2021-08-03 DIAGNOSIS — Y835 Amputation of limb(s) as the cause of abnormal reaction of the patient, or of later complication, without mention of misadventure at the time of the procedure: Secondary | ICD-10-CM | POA: Diagnosis present

## 2021-08-03 DIAGNOSIS — I743 Embolism and thrombosis of arteries of the lower extremities: Secondary | ICD-10-CM | POA: Diagnosis not present

## 2021-08-03 DIAGNOSIS — I50811 Acute right heart failure: Secondary | ICD-10-CM | POA: Diagnosis present

## 2021-08-03 DIAGNOSIS — I959 Hypotension, unspecified: Secondary | ICD-10-CM | POA: Diagnosis not present

## 2021-08-03 DIAGNOSIS — I4892 Unspecified atrial flutter: Secondary | ICD-10-CM | POA: Diagnosis not present

## 2021-08-03 DIAGNOSIS — I2602 Saddle embolus of pulmonary artery with acute cor pulmonale: Secondary | ICD-10-CM | POA: Diagnosis not present

## 2021-08-03 DIAGNOSIS — I739 Peripheral vascular disease, unspecified: Secondary | ICD-10-CM | POA: Diagnosis not present

## 2021-08-03 DIAGNOSIS — Z79899 Other long term (current) drug therapy: Secondary | ICD-10-CM | POA: Diagnosis not present

## 2021-08-03 DIAGNOSIS — I82403 Acute embolism and thrombosis of unspecified deep veins of lower extremity, bilateral: Secondary | ICD-10-CM | POA: Diagnosis present

## 2021-08-03 DIAGNOSIS — I110 Hypertensive heart disease with heart failure: Secondary | ICD-10-CM | POA: Diagnosis present

## 2021-08-03 DIAGNOSIS — M7989 Other specified soft tissue disorders: Secondary | ICD-10-CM | POA: Diagnosis not present

## 2021-08-03 DIAGNOSIS — M79672 Pain in left foot: Secondary | ICD-10-CM | POA: Diagnosis present

## 2021-08-03 DIAGNOSIS — I2609 Other pulmonary embolism with acute cor pulmonale: Secondary | ICD-10-CM

## 2021-08-03 DIAGNOSIS — I471 Supraventricular tachycardia, unspecified: Secondary | ICD-10-CM

## 2021-08-03 DIAGNOSIS — I472 Ventricular tachycardia, unspecified: Secondary | ICD-10-CM | POA: Diagnosis not present

## 2021-08-03 DIAGNOSIS — R0902 Hypoxemia: Secondary | ICD-10-CM | POA: Diagnosis not present

## 2021-08-03 DIAGNOSIS — I454 Nonspecific intraventricular block: Secondary | ICD-10-CM | POA: Diagnosis present

## 2021-08-03 DIAGNOSIS — N400 Enlarged prostate without lower urinary tract symptoms: Secondary | ICD-10-CM | POA: Diagnosis present

## 2021-08-03 DIAGNOSIS — R0602 Shortness of breath: Secondary | ICD-10-CM | POA: Diagnosis not present

## 2021-08-03 DIAGNOSIS — Z87891 Personal history of nicotine dependence: Secondary | ICD-10-CM | POA: Diagnosis not present

## 2021-08-03 DIAGNOSIS — I998 Other disorder of circulatory system: Principal | ICD-10-CM

## 2021-08-03 DIAGNOSIS — Z89611 Acquired absence of right leg above knee: Secondary | ICD-10-CM | POA: Diagnosis not present

## 2021-08-03 DIAGNOSIS — Z950 Presence of cardiac pacemaker: Secondary | ICD-10-CM

## 2021-08-03 DIAGNOSIS — Z8249 Family history of ischemic heart disease and other diseases of the circulatory system: Secondary | ICD-10-CM

## 2021-08-03 DIAGNOSIS — Z86718 Personal history of other venous thrombosis and embolism: Secondary | ICD-10-CM

## 2021-08-03 DIAGNOSIS — I70222 Atherosclerosis of native arteries of extremities with rest pain, left leg: Secondary | ICD-10-CM | POA: Diagnosis present

## 2021-08-03 DIAGNOSIS — R609 Edema, unspecified: Secondary | ICD-10-CM | POA: Diagnosis not present

## 2021-08-03 DIAGNOSIS — I252 Old myocardial infarction: Secondary | ICD-10-CM

## 2021-08-03 DIAGNOSIS — R200 Anesthesia of skin: Secondary | ICD-10-CM | POA: Diagnosis present

## 2021-08-03 DIAGNOSIS — D649 Anemia, unspecified: Secondary | ICD-10-CM | POA: Diagnosis not present

## 2021-08-03 DIAGNOSIS — I82412 Acute embolism and thrombosis of left femoral vein: Secondary | ICD-10-CM | POA: Diagnosis not present

## 2021-08-03 DIAGNOSIS — E871 Hypo-osmolality and hyponatremia: Secondary | ICD-10-CM | POA: Diagnosis present

## 2021-08-03 DIAGNOSIS — I50810 Right heart failure, unspecified: Secondary | ICD-10-CM | POA: Diagnosis not present

## 2021-08-03 DIAGNOSIS — E8770 Fluid overload, unspecified: Secondary | ICD-10-CM | POA: Diagnosis not present

## 2021-08-03 DIAGNOSIS — I2781 Cor pulmonale (chronic): Secondary | ICD-10-CM | POA: Diagnosis present

## 2021-08-03 DIAGNOSIS — Z86711 Personal history of pulmonary embolism: Secondary | ICD-10-CM

## 2021-08-03 DIAGNOSIS — R161 Splenomegaly, not elsewhere classified: Secondary | ICD-10-CM | POA: Diagnosis present

## 2021-08-03 DIAGNOSIS — I82592 Chronic embolism and thrombosis of other specified deep vein of left lower extremity: Secondary | ICD-10-CM | POA: Diagnosis not present

## 2021-08-03 DIAGNOSIS — I82492 Acute embolism and thrombosis of other specified deep vein of left lower extremity: Secondary | ICD-10-CM | POA: Diagnosis not present

## 2021-08-03 DIAGNOSIS — I440 Atrioventricular block, first degree: Secondary | ICD-10-CM | POA: Diagnosis present

## 2021-08-03 LAB — I-STAT CHEM 8, ED
BUN: 31 mg/dL — ABNORMAL HIGH (ref 8–23)
Calcium, Ion: 1.11 mmol/L — ABNORMAL LOW (ref 1.15–1.40)
Chloride: 92 mmol/L — ABNORMAL LOW (ref 98–111)
Creatinine, Ser: 0.8 mg/dL (ref 0.61–1.24)
Glucose, Bld: 154 mg/dL — ABNORMAL HIGH (ref 70–99)
HCT: 36 % — ABNORMAL LOW (ref 39.0–52.0)
Hemoglobin: 12.2 g/dL — ABNORMAL LOW (ref 13.0–17.0)
Potassium: 4 mmol/L (ref 3.5–5.1)
Sodium: 123 mmol/L — ABNORMAL LOW (ref 135–145)
TCO2: 21 mmol/L — ABNORMAL LOW (ref 22–32)

## 2021-08-03 LAB — BASIC METABOLIC PANEL
Anion gap: 12 (ref 5–15)
BUN: 32 mg/dL — ABNORMAL HIGH (ref 8–23)
CO2: 20 mmol/L — ABNORMAL LOW (ref 22–32)
Calcium: 8.9 mg/dL (ref 8.9–10.3)
Chloride: 93 mmol/L — ABNORMAL LOW (ref 98–111)
Creatinine, Ser: 0.82 mg/dL (ref 0.61–1.24)
GFR, Estimated: >60 mL/min (ref >60)
Glucose, Bld: 147 mg/dL — ABNORMAL HIGH (ref 70–99)
Potassium: 4 mmol/L (ref 3.5–5.1)
Sodium: 125 mmol/L — ABNORMAL LOW (ref 135–145)

## 2021-08-03 LAB — CBC WITH DIFFERENTIAL/PLATELET
Abs Immature Granulocytes: 0.37 10*3/uL — ABNORMAL HIGH (ref 0.00–0.07)
Basophils Absolute: 0.1 10*3/uL (ref 0.0–0.1)
Basophils Relative: 1 %
Eosinophils Absolute: 0 10*3/uL (ref 0.0–0.5)
Eosinophils Relative: 1 %
HCT: 34.4 % — ABNORMAL LOW (ref 39.0–52.0)
Hemoglobin: 10.9 g/dL — ABNORMAL LOW (ref 13.0–17.0)
Immature Granulocytes: 5 %
Lymphocytes Relative: 33 %
Lymphs Abs: 2.6 10*3/uL (ref 0.7–4.0)
MCH: 27.6 pg (ref 26.0–34.0)
MCHC: 31.7 g/dL (ref 30.0–36.0)
MCV: 87.1 fL (ref 80.0–100.0)
Monocytes Absolute: 0.4 10*3/uL (ref 0.1–1.0)
Monocytes Relative: 5 %
Neutro Abs: 4.6 10*3/uL (ref 1.7–7.7)
Neutrophils Relative %: 55 %
Platelets: 361 10*3/uL (ref 150–400)
RBC: 3.95 MIL/uL — ABNORMAL LOW (ref 4.22–5.81)
RDW: 16.8 % — ABNORMAL HIGH (ref 11.5–15.5)
WBC: 8.1 10*3/uL (ref 4.0–10.5)
nRBC: 0 % (ref 0.0–0.2)

## 2021-08-03 LAB — PROTIME-INR
INR: 1 (ref 0.8–1.2)
Prothrombin Time: 12.9 seconds (ref 11.4–15.2)

## 2021-08-03 MED ORDER — PANTOPRAZOLE SODIUM 40 MG PO TBEC
40.0000 mg | DELAYED_RELEASE_TABLET | Freq: Every day | ORAL | Status: DC
  Administered 2021-08-05 – 2021-08-10 (×6): 40 mg via ORAL
  Filled 2021-08-03 (×6): qty 1

## 2021-08-03 MED ORDER — PHENOL 1.4 % MT LIQD: 1.0000 | OROMUCOSAL | Status: DC | PRN

## 2021-08-03 MED ORDER — IRBESARTAN 300 MG PO TABS
300.0000 mg | ORAL_TABLET | Freq: Every day | ORAL | Status: DC
  Filled 2021-08-03 (×2): qty 1

## 2021-08-03 MED ORDER — OXYCODONE HCL 5 MG PO TABS
5.0000 mg | ORAL_TABLET | ORAL | Status: DC | PRN
  Administered 2021-08-06 – 2021-08-09 (×5): 10 mg via ORAL
  Filled 2021-08-03 (×5): qty 2

## 2021-08-03 MED ORDER — MELATONIN 5 MG PO TABS
5.0000 mg | ORAL_TABLET | Freq: Every day | ORAL | Status: DC
  Administered 2021-08-04 – 2021-08-09 (×6): 5 mg via ORAL
  Filled 2021-08-03 (×6): qty 1

## 2021-08-03 MED ORDER — AMLODIPINE BESYLATE 10 MG PO TABS: 10.0000 mg | ORAL_TABLET | Freq: Every day | ORAL | Status: DC

## 2021-08-03 MED ORDER — SODIUM CHLORIDE 0.9 % IV SOLN: INTRAVENOUS | Status: DC

## 2021-08-03 MED ORDER — GUAIFENESIN-DM 100-10 MG/5ML PO SYRP: 15.0000 mL | ORAL_SOLUTION | ORAL | Status: DC | PRN

## 2021-08-03 MED ORDER — SODIUM CHLORIDE 0.9 % IV BOLUS
500.0000 mL | Freq: Once | INTRAVENOUS | Status: AC
  Administered 2021-08-03: 500 mL via INTRAVENOUS

## 2021-08-03 MED ORDER — HYDRALAZINE HCL 20 MG/ML IJ SOLN: 5.0000 mg | INTRAMUSCULAR | Status: DC | PRN

## 2021-08-03 MED ORDER — METOPROLOL TARTRATE 5 MG/5ML IV SOLN: 2.0000 mg | INTRAVENOUS | Status: DC | PRN

## 2021-08-03 MED ORDER — TERAZOSIN HCL 5 MG PO CAPS
15.0000 mg | ORAL_CAPSULE | Freq: Every day | ORAL | Status: DC
  Administered 2021-08-04 – 2021-08-09 (×6): 15 mg via ORAL
  Filled 2021-08-03 (×8): qty 3

## 2021-08-03 MED ORDER — ASCORBIC ACID 500 MG PO TABS: 1000.0000 mg | ORAL_TABLET | Freq: Every day | ORAL | Status: DC

## 2021-08-03 MED ORDER — ALBUTEROL SULFATE (2.5 MG/3ML) 0.083% IN NEBU
2.5000 mg | INHALATION_SOLUTION | Freq: Four times a day (QID) | RESPIRATORY_TRACT | Status: DC
  Administered 2021-08-04: 2.5 mg via RESPIRATORY_TRACT
  Filled 2021-08-03: qty 3

## 2021-08-03 MED ORDER — HEPARIN BOLUS VIA INFUSION
4000.0000 [IU] | Freq: Once | INTRAVENOUS | Status: AC
  Administered 2021-08-03: 4000 [IU] via INTRAVENOUS
  Filled 2021-08-03: qty 4000

## 2021-08-03 MED ORDER — HYDROMORPHONE HCL 1 MG/ML IJ SOLN: 0.5000 mg | INTRAMUSCULAR | Status: DC | PRN

## 2021-08-03 MED ORDER — DOCUSATE SODIUM 100 MG PO CAPS
100.0000 mg | ORAL_CAPSULE | Freq: Two times a day (BID) | ORAL | Status: DC
  Administered 2021-08-04 – 2021-08-09 (×11): 100 mg via ORAL
  Filled 2021-08-03 (×11): qty 1

## 2021-08-03 MED ORDER — ADULT MULTIVITAMIN W/MINERALS CH
1.0000 | ORAL_TABLET | Freq: Every day | ORAL | Status: DC
  Administered 2021-08-05 – 2021-08-10 (×6): 1 via ORAL
  Filled 2021-08-03 (×6): qty 1

## 2021-08-03 MED ORDER — MONTELUKAST SODIUM 10 MG PO TABS
10.0000 mg | ORAL_TABLET | Freq: Every day | ORAL | Status: DC | PRN
  Filled 2021-08-03: qty 1

## 2021-08-03 MED ORDER — METHOCARBAMOL 500 MG PO TABS
500.0000 mg | ORAL_TABLET | Freq: Three times a day (TID) | ORAL | Status: DC
  Administered 2021-08-04 – 2021-08-10 (×17): 500 mg via ORAL
  Filled 2021-08-03 (×17): qty 1

## 2021-08-03 MED ORDER — HEPARIN (PORCINE) 25000 UT/250ML-% IV SOLN
1650.0000 [IU]/h | INTRAVENOUS | Status: DC
  Administered 2021-08-03 – 2021-08-04 (×2): 1650 [IU]/h via INTRAVENOUS
  Filled 2021-08-03 (×2): qty 250

## 2021-08-03 MED ORDER — ONDANSETRON HCL 4 MG/2ML IJ SOLN
4.0000 mg | Freq: Four times a day (QID) | INTRAMUSCULAR | Status: DC | PRN
  Administered 2021-08-03: 4 mg via INTRAVENOUS
  Filled 2021-08-03: qty 2

## 2021-08-03 MED ORDER — METOPROLOL TARTRATE 25 MG PO TABS
25.0000 mg | ORAL_TABLET | Freq: Three times a day (TID) | ORAL | Status: DC
  Administered 2021-08-04: 25 mg via ORAL
  Filled 2021-08-03 (×3): qty 1

## 2021-08-03 MED ORDER — PANTOPRAZOLE SODIUM 40 MG PO TBEC: 40.0000 mg | DELAYED_RELEASE_TABLET | Freq: Two times a day (BID) | ORAL | Status: DC

## 2021-08-03 MED ORDER — OXYMETAZOLINE HCL 0.05 % NA SOLN: 1.0000 | Freq: Two times a day (BID) | NASAL | Status: AC | PRN

## 2021-08-03 MED ORDER — ASCORBIC ACID 500 MG PO TABS
1000.0000 mg | ORAL_TABLET | Freq: Every day | ORAL | Status: DC
  Administered 2021-08-04 – 2021-08-10 (×7): 1000 mg via ORAL
  Filled 2021-08-03 (×7): qty 2

## 2021-08-03 MED ORDER — GABAPENTIN 300 MG PO CAPS
300.0000 mg | ORAL_CAPSULE | Freq: Every day | ORAL | Status: DC
  Administered 2021-08-04 – 2021-08-09 (×6): 300 mg via ORAL
  Filled 2021-08-03 (×6): qty 1

## 2021-08-03 MED ORDER — ROSUVASTATIN CALCIUM 20 MG PO TABS
20.0000 mg | ORAL_TABLET | Freq: Every day | ORAL | Status: DC
  Administered 2021-08-05 – 2021-08-10 (×6): 20 mg via ORAL
  Filled 2021-08-03 (×6): qty 1

## 2021-08-03 MED ORDER — ALBUTEROL SULFATE HFA 108 (90 BASE) MCG/ACT IN AERS: 2.0000 | INHALATION_SPRAY | Freq: Four times a day (QID) | RESPIRATORY_TRACT | Status: DC

## 2021-08-03 MED ORDER — FINASTERIDE 5 MG PO TABS
5.0000 mg | ORAL_TABLET | Freq: Every day | ORAL | Status: DC
  Administered 2021-08-04 – 2021-08-10 (×7): 5 mg via ORAL
  Filled 2021-08-03 (×7): qty 1

## 2021-08-03 MED ORDER — ASPIRIN EC 81 MG PO TBEC
81.0000 mg | DELAYED_RELEASE_TABLET | Freq: Every day | ORAL | Status: DC
  Administered 2021-08-04 – 2021-08-09 (×6): 81 mg via ORAL
  Filled 2021-08-03 (×6): qty 1

## 2021-08-03 MED ORDER — LABETALOL HCL 5 MG/ML IV SOLN: 10.0000 mg | INTRAVENOUS | Status: DC | PRN

## 2021-08-03 MED ORDER — POTASSIUM CHLORIDE CRYS ER 20 MEQ PO TBCR: 20.0000 meq | EXTENDED_RELEASE_TABLET | Freq: Once | ORAL | Status: DC

## 2021-08-03 MED ORDER — POLYETHYLENE GLYCOL 3350 17 G PO PACK
17.0000 g | PACK | Freq: Two times a day (BID) | ORAL | Status: DC
  Administered 2021-08-05 – 2021-08-09 (×7): 17 g via ORAL
  Filled 2021-08-03 (×9): qty 1

## 2021-08-03 MED ORDER — ALUM & MAG HYDROXIDE-SIMETH 200-200-20 MG/5ML PO SUSP
15.0000 mL | ORAL | Status: DC | PRN
  Administered 2021-08-03 – 2021-08-04 (×2): 30 mL via ORAL
  Filled 2021-08-03 (×2): qty 30

## 2021-08-03 NOTE — H&P (Signed)
?H+P ? ?History of Present Illness: This is a 71 y.o. male with history of acute on chronic right lower extremity ischemia that required bypass and ultimately below-knee amputation that did not heal and now has above-knee amputation.  He was recently discharged from rehab.  He states he takes no antiplatelets or blood thinners since being home.  States that he is only moving when the nurses come out to help him sit up in his bed but is otherwise been only laying in the bed.  He does have an indwelling catheter since recent hospitalization.  Over the past few days he states he has worsening pain in the left foot which has progressed to moderate to severe today.  He is concern for need for amputation.  He also states that he is swelling in the leg that has been mostly stable but increased today.  He does not have any chest pain or shortness of breath.  No wounds on the left foot.  He is able to move the foot without limitation but states that he does have numbness. ? ?Past Medical History:  ?Diagnosis Date  ? BPH (benign prostatic hyperplasia)   ? CLL (chronic lymphocytic leukemia) ([place])   ? Hypertension   ? Pacemaker   ? Medtronic Device  ? ? ?Past Surgical History:  ?Procedure Laterality Date  ? ABDOMINAL AORTOGRAM W/LOWER EXTREMITY Bilateral 06/21/2021  ? Procedure: ABDOMINAL AORTOGRAM W/LOWER EXTREMITY;  Surgeon: Waynetta Sandy, MD;  Location: [facility];  Service: Cardiovascular;  Laterality: Bilateral;  ? AMPUTATION Right 06/25/2021  ? Procedure: RIGHT BELOW KNEE AMPUTATION;  Surgeon: Waynetta Sandy, MD;  Location: [place];  Service: Vascular;  Laterality: Right;  ? AMPUTATION Right 07/19/2021  ? Procedure: AMPUTATION ABOVE KNEE;  Surgeon: Angelia Mould, MD;  Location: [facility];  Service: Vascular;  Laterality: Right;  ? FEMORAL-POPLITEAL BYPASS GRAFT Right 06/22/2021  ? Procedure: RIGHT FEMORAL-POPLITEAL BYPASS WITH VEIN, RIGHT POPLITEAL THROMBECTOMY;  Surgeon: Waynetta Sandy, MD;  Location: [facility];  Service: Vascular;  Laterality: Right;  ? LEFT HEART CATH AND CORONARY ANGIOGRAPHY N/A 01/18/2021  ? Procedure: LEFT HEART CATH AND CORONARY ANGIOGRAPHY;  Surgeon: Burnell Blanks, MD;  Location: [facility];  Service: Cardiovascular;  Laterality: N/A;  ? PACEMAKER IMPLANT N/A 01/19/2021  ? Procedure: PACEMAKER IMPLANT;  Surgeon: Evans Lance, MD;  Location: [facility];  Service: Cardiovascular;  Laterality: N/A;  ? ? ?Not on File ? ?Prior to Admission medications   ?Medication Sig Start Date End Date Taking? Authorizing Provider  ?albuterol (VENTOLIN HFA) 108 (90 Base) MCG/ACT inhaler Inhale 2 puffs into the lungs 4 (four) times daily. 07/15/21   Angiulli, Lavon Paganini, PA-C  ?amLODipine (NORVASC) 10 MG tablet Take 1 tablet (10 mg total) by mouth daily. 07/15/21   Angiulli, Lavon Paganini, PA-C  ?ascorbic acid (VITAMIN C) 1000 MG tablet Take 1 tablet (1,000 mg total) by mouth daily. 07/15/21   Angiulli, Lavon Paganini, PA-C  ?aspirin EC 81 MG EC tablet Take 1 tablet (81 mg total) by mouth daily. Swallow whole. 07/02/21   Little Ishikawa, MD  ?docusate sodium (COLACE) 100 MG capsule Take 1 capsule (100 mg total) by mouth 2 (two) times daily. 07/15/21   Angiulli, Lavon Paganini, PA-C  ?finasteride (PROSCAR) 5 MG tablet Take 1 tablet (5 mg total) by mouth daily. 07/15/21   Angiulli, Lavon Paganini, PA-C  ?gabapentin (NEURONTIN) 300 MG capsule Take 1 capsule (300 mg total) by mouth at bedtime.  07/15/21   Angiulli, Lavon Paganini, PA-C  ?IMBRUVICA 420 MG tablet TAKE 1 TABLET BY MOUTH ONCE  DAILY WITH A FULL GLASS OF WATER ?Patient taking differently: 420 mg daily. WITH A FULL GLASS OF  WATER 06/14/21   Wyatt Portela, MD  ?irbesartan (AVAPRO) 300 MG tablet Take 1 tablet (300 mg total) by mouth daily. 07/15/21   Angiulli, Lavon Paganini, PA-C  ?melatonin 5 MG TABS Take 1 tablet (5 mg total) by mouth at bedtime. 07/15/21   Angiulli, Lavon Paganini, PA-C  ?methocarbamol (ROBAXIN) 500 MG tablet Take 1 tablet (500 mg  total) by mouth 3 (three) times daily. 07/15/21   Angiulli, Lavon Paganini, PA-C  ?metoprolol tartrate (LOPRESSOR) 25 MG tablet Take 1 tablet (25 mg total) by mouth every 8 (eight) hours. 07/15/21   Angiulli, Lavon Paganini, PA-C  ?montelukast (SINGULAIR) 10 MG tablet Take 1 tablet (10 mg total) by mouth daily as needed (allergies). 07/15/21   Angiulli, Lavon Paganini, PA-C  ?Multiple Vitamin (MULTIVITAMIN WITH MINERALS) TABS tablet Take 1 tablet by mouth daily. 07/15/21   Angiulli, Lavon Paganini, PA-C  ?oxyCODONE (OXY IR/ROXICODONE) 5 MG immediate release tablet Take 1 tablet (5 mg total) by mouth every 4 (four) hours as needed for moderate pain. 07/15/21   Angiulli, Lavon Paganini, PA-C  ?oxymetazoline (AFRIN) 0.05 % nasal spray Place 1 spray into both nostrils 2 (two) times daily as needed for congestion.    [provider]  ?pantoprazole (PROTONIX) 40 MG tablet Take 1 tablet (40 mg total) by mouth 2 (two) times daily. 07/15/21   Angiulli, Lavon Paganini, PA-C  ?polyethylene glycol (MIRALAX / GLYCOLAX) 17 g packet Take 17 g by mouth 2 (two) times daily. 07/15/21   Angiulli, Lavon Paganini, PA-C  ?rosuvastatin (CRESTOR) 20 MG tablet Take 1 tablet (20 mg total) by mouth daily. 07/15/21   Angiulli, Lavon Paganini, PA-C  ?terazosin (HYTRIN) 5 MG capsule Take 3 capsules (15 mg total) by mouth at bedtime. 07/15/21   Angiulli, Lavon Paganini, PA-C  ? ? ?Social History  ? ?Socioeconomic History  ? Marital status: Married  ?  Spouse name: Not on file  ? Number of children: Not on file  ? Years of education: Not on file  ? Highest education level: Not on file  ?Occupational History  ? Occupation: retired  ?Tobacco Use  ? Smoking status: Former  ?  Packs/day: 0.50  ?  Years: 10.00  ?  Pack years: 5.00  ?  Types: Cigarettes  ? Smokeless tobacco: Never  ?Vaping Use  ? Vaping Use: Never used  ?Substance and Sexual Activity  ? Alcohol use: Not Currently  ? Drug use: Never  ? Sexual activity: Not on file  ?Other Topics Concern  ? Not on file  ?Social History Narrative  ? Not on file   ? ?Social Determinants of Health  ? ?Financial Resource Strain: Not on file  ?Food Insecurity: Not on file  ?Transportation Needs: Not on file  ?Physical Activity: Not on file  ?Stress: Not on file  ?Social Connections: Not on file  ?Intimate Partner Violence: Not on file  ? ? ? ?Family History  ?Problem Relation Age of Onset  ? Lung cancer Mother   ? Coronary artery disease Father   ? ? ?ROS: as above ? ? ?Physical Examination ? ?Vitals:  ? 08/03/21 1843  ?BP: 101/61  ?Pulse: (!) 108  ?Resp: 16  ?Temp: (!) 97.4 ?F (36.3 ?C)  ?SpO2: 99%  ? ?Body mass index is 29.84 kg/m?[name] ? ?  Physical Exam ?HENT:  ?   Head: Normocephalic.  ?   Nose: Nose normal.  ?   Mouth/Throat:  ?   Mouth: Mucous membranes are moist.  ?Eyes:  ?   Pupils: Pupils are equal, round, and reactive to light.  ?Cardiovascular:  ?   Rate and Rhythm: Normal rate.  ?   Pulses:     ?     Femoral pulses are 2+ on the right side and 2+ on the left side. ?     Popliteal pulses are 0 on the left side.  ?   Comments: 71 year old male with recent right above-knee amputation for acute on chronic ischemia with previous ABI of 0.74 on the left now with with recent recent right above-knee amputation for acute on chronic recent with known with known left SFA occlusion posterior tibial and peroneal signals although monophasic on the left ?Pulmonary:  ?   Effort: Pulmonary effort is normal.  ?Abdominal:  ?   General: Abdomen is flat.  ?   Palpations: There is no mass.  ?Genitourinary: ?   Comments: Foley catheter in place ?Musculoskeletal:  ?   Cervical back: Neck supple.  ?Skin: ?   Capillary Refill: Capillary refill takes 2 to 3 seconds.  ?Neurological:  ?   General: No focal deficit present.  ?   Mental Status: He is alert.  ?   Comments: States that his left foot is somewhat although he can feel touch  ?Psychiatric:     ?   Mood and Affect: Mood normal.     ?   Behavior: Behavior normal.     ?   Thought Content: Thought content normal.     ?   Judgment: Judgment  normal.  ? ? ? ?CBC ?   ?Component Value Date/Time  ? WBC 9.5 07/21/2021 0150  ? RBC 3.71 (L) 07/21/2021 0150  ? HGB 10.4 (L) 07/21/2021 0150  ? HGB 14.9 05/04/2021 0752  ? HCT 33.7 (L) 07/21/2021 0150  ? PLT 607 (H) 04

## 2021-08-03 NOTE — Progress Notes (Signed)
ANTICOAGULATION CONSULT NOTE - Initial Consult ? ?Pharmacy Consult for heparin ?Indication:  acute limb ischemia ? ?Not on File ? ?Patient Measurements: ?Height: 6' (182.9 cm) ?Weight: 99.8 kg (220 lb) ?IBW/kg (Calculated) : 77.6 ?Heparin Dosing Weight: 97 kg  ? ?Vital Signs: ?Temp: 97.4 ?F (36.3 ?C) (04/25 1843) ?Temp Source: Oral (04/25 1843) ?BP: 101/61 (04/25 1843) ?Pulse Rate: 108 (04/25 1843) ? ?Labs: ?No results for input(s): HGB, HCT, PLT, APTT, LABPROT, INR, HEPARINUNFRC, HEPRLOWMOCWT, CREATININE, CKTOTAL, CKMB, TROPONINIHS in the last 72 hours. ? ?Estimated Creatinine Clearance: 102.1 mL/min (A) (by C-G formula based on SCr of 0.45 mg/dL (L)). ? ? ?Medical History: ?Past Medical History:  ?Diagnosis Date  ? BPH (benign prostatic hyperplasia)   ? CLL (chronic lymphocytic leukemia) ([place])   ? Hypertension   ? Pacemaker   ? Medtronic Device  ? ? ?Medications:  ?(Not in a hospital admission)  ? ?Assessment: ?73 YOM who presents with left and foot pain with recent complicated history of critical limb ischemia to the R leg requiring R BKA. Pharmacy consulted to start IV heparin limb ischemia to the L foot.  ? ?Hgb mildly low. Plt and SCr wnl.  ? ?Goal of Therapy:  ?Heparin level 0.3-0.7 units/ml ?Monitor platelets by anticoagulation protocol: Yes ?  ?Plan:  ?-Heparin 4000 units IV bolus followed by heparin infusion at 1650 units/hr  ?-F/u 8 hr HL ?-Monitor daily HL, CBC and watch for s/s of bleeding ? ?Albertina Parr, PharmD., BCCCP ?Clinical Pharmacist ?Please refer to [facility] for unit-specific pharmacist  ? ?

## 2021-08-03 NOTE — ED Provider Notes (Signed)
?[facility] ?Provider Note ? ? ?CSN: [id number] ?Arrival date & time: 08/03/21  1803 ? ?  ? ?History ? ?No chief complaint on file. ? ? ?Douglas [name] is a 71 y.o. male. ? ?Patient is a 71 year old male who presents with left leg and foot pain.  He has a recent complicated history of critical limb ischemia to the right leg.  He was initially admitted on March 10 and was found to have critical limb ischemia.  He initially underwent bypass surgery and thrombectomy on March 14.  This failed and he subsequently underwent a right BKA.  He was discharged to rehab but readmitted after it also failed and underwent a AKA on 4/10.  He says over the last 3 to 4 days he has had progressive pain in his left foot with discoloration in the foot.  He also has some decrease sensation to the foot.  No shortness of breath or chest pain.  No fevers.  No leg swelling. ? ? ?  ? ?Home Medications ?Prior to Admission medications   ?Medication Sig Start Date End Date Taking? Authorizing Provider  ?albuterol (VENTOLIN HFA) 108 (90 Base) MCG/ACT inhaler Inhale 2 puffs into the lungs 4 (four) times daily. 07/15/21   Angiulli, Lavon Paganini, PA-C  ?amLODipine (NORVASC) 10 MG tablet Take 1 tablet (10 mg total) by mouth daily. 07/15/21   Angiulli, Lavon Paganini, PA-C  ?ascorbic acid (VITAMIN C) 1000 MG tablet Take 1 tablet (1,000 mg total) by mouth daily. 07/15/21   Angiulli, Lavon Paganini, PA-C  ?aspirin EC 81 MG EC tablet Take 1 tablet (81 mg total) by mouth daily. Swallow whole. 07/02/21   Little Ishikawa, MD  ?docusate sodium (COLACE) 100 MG capsule Take 1 capsule (100 mg total) by mouth 2 (two) times daily. 07/15/21   Angiulli, Lavon Paganini, PA-C  ?finasteride (PROSCAR) 5 MG tablet Take 1 tablet (5 mg total) by mouth daily. 07/15/21   Angiulli, Lavon Paganini, PA-C  ?gabapentin (NEURONTIN) 300 MG capsule Take 1 capsule (300 mg total) by mouth at bedtime. 07/15/21   Angiulli, Lavon Paganini, PA-C  ?IMBRUVICA 420 MG tablet TAKE 1 TABLET BY  MOUTH ONCE  DAILY WITH A FULL GLASS OF WATER ?Patient taking differently: 420 mg daily. WITH A FULL GLASS OF  WATER 06/14/21   Wyatt Portela, MD  ?irbesartan (AVAPRO) 300 MG tablet Take 1 tablet (300 mg total) by mouth daily. 07/15/21   Angiulli, Lavon Paganini, PA-C  ?melatonin 5 MG TABS Take 1 tablet (5 mg total) by mouth at bedtime. 07/15/21   Angiulli, Lavon Paganini, PA-C  ?methocarbamol (ROBAXIN) 500 MG tablet Take 1 tablet (500 mg total) by mouth 3 (three) times daily. 07/15/21   Angiulli, Lavon Paganini, PA-C  ?metoprolol tartrate (LOPRESSOR) 25 MG tablet Take 1 tablet (25 mg total) by mouth every 8 (eight) hours. 07/15/21   Angiulli, Lavon Paganini, PA-C  ?montelukast (SINGULAIR) 10 MG tablet Take 1 tablet (10 mg total) by mouth daily as needed (allergies). 07/15/21   Angiulli, Lavon Paganini, PA-C  ?Multiple Vitamin (MULTIVITAMIN WITH MINERALS) TABS tablet Take 1 tablet by mouth daily. 07/15/21   Angiulli, Lavon Paganini, PA-C  ?oxyCODONE (OXY IR/ROXICODONE) 5 MG immediate release tablet Take 1 tablet (5 mg total) by mouth every 4 (four) hours as needed for moderate pain. 07/15/21   Angiulli, Lavon Paganini, PA-C  ?oxymetazoline (AFRIN) 0.05 % nasal spray Place 1 spray into both nostrils 2 (two) times daily as needed for congestion.  [provider]  ?pantoprazole (PROTONIX) 40 MG tablet Take 1 tablet (40 mg total) by mouth 2 (two) times daily. 07/15/21   Angiulli, Lavon Paganini, PA-C  ?polyethylene glycol (MIRALAX / GLYCOLAX) 17 g packet Take 17 g by mouth 2 (two) times daily. 07/15/21   Angiulli, Lavon Paganini, PA-C  ?rosuvastatin (CRESTOR) 20 MG tablet Take 1 tablet (20 mg total) by mouth daily. 07/15/21   Angiulli, Lavon Paganini, PA-C  ?terazosin (HYTRIN) 5 MG capsule Take 3 capsules (15 mg total) by mouth at bedtime. 07/15/21   Angiulli, Lavon Paganini, PA-C  ?   ? ?Allergies    ?Patient has no allergy information on record.   ? ?Review of Systems   ?Review of Systems  ?Constitutional:  Negative for chills, diaphoresis, fatigue and fever.  ?HENT:  Negative for congestion,  rhinorrhea and sneezing.   ?Eyes: Negative.   ?Respiratory:  Negative for cough, chest tightness and shortness of breath.   ?Cardiovascular:  Negative for chest pain and leg swelling.  ?Gastrointestinal:  Negative for abdominal pain, blood in stool, diarrhea, nausea and vomiting.  ?Genitourinary:  Negative for difficulty urinating, flank pain, frequency and hematuria.  ?Musculoskeletal:  Positive for myalgias. Negative for arthralgias and back pain.  ?Skin:  Positive for color change. Negative for rash.  ?Neurological:  Negative for dizziness, speech difficulty, weakness, numbness and headaches.  ? ?Physical Exam ?Updated Vital Signs ?BP 101/61 (BP Location: Right Arm)   Pulse (!) 108   Temp (!) 97.4 ?F (36.3 ?C) (Oral)   Resp 16   Ht 6' (1.829 m)   Wt 99.8 kg   SpO2 99%   BMI 29.84 kg/m?  ?Physical Exam ?Constitutional:   ?   Appearance: He is well-developed.  ?HENT:  ?   Head: Normocephalic and atraumatic.  ?Eyes:  ?   Pupils: Pupils are equal, round, and reactive to light.  ?Cardiovascular:  ?   Rate and Rhythm: Normal rate and regular rhythm.  ?   Heart sounds: Normal heart sounds.  ?Pulmonary:  ?   Effort: Pulmonary effort is normal. No respiratory distress.  ?   Breath sounds: Normal breath sounds. No wheezing or rales.  ?Chest:  ?   Chest wall: No tenderness.  ?Abdominal:  ?   General: Bowel sounds are normal.  ?   Palpations: Abdomen is soft.  ?   Tenderness: There is no abdominal tenderness. There is no guarding or rebound.  ?Musculoskeletal:     ?   General: Normal range of motion.  ?   Cervical back: Normal range of motion and neck supple.  ?   Comments: Patient status post right AKA.  Staples are intact.  No signs of infection.  His left foot is cold and slightly discolored.  No palpable pulses.  DP pulses unobtainable.  PT pulses obtained softly by Doppler.  ?Lymphadenopathy:  ?   Cervical: No cervical adenopathy.  ?Skin: ?   General: Skin is warm and dry.  ?   Findings: No rash.  ?Neurological:   ?   Mental Status: He is alert and oriented to person, place, and time.  ? ? ?ED Results / Procedures / Treatments   ?Labs ?(all labs ordered are listed, but only abnormal results are displayed) ?Labs Reviewed  ?BASIC METABOLIC PANEL - Abnormal; Notable for the following components:  ?    Result Value  ? Sodium 125 (*)   ? Chloride 93 (*)   ? CO2 20 (*)   ? Glucose, Bld 147 (*)   ?  BUN 32 (*)   ? All other components within normal limits  ?CBC WITH DIFFERENTIAL/PLATELET - Abnormal; Notable for the following components:  ? RBC 3.95 (*)   ? Hemoglobin 10.9 (*)   ? HCT 34.4 (*)   ? RDW 16.8 (*)   ? Abs Immature Granulocytes 0.37 (*)   ? All other components within normal limits  ?I-STAT CHEM 8, ED - Abnormal; Notable for the following components:  ? Sodium 123 (*)   ? Chloride 92 (*)   ? BUN 31 (*)   ? Glucose, Bld 154 (*)   ? Calcium, Ion 1.11 (*)   ? TCO2 21 (*)   ? Hemoglobin 12.2 (*)   ? HCT 36.0 (*)   ? All other components within normal limits  ?PROTIME-INR  ?HEPARIN LEVEL (UNFRACTIONATED)  ?CBC  ? ? ?EKG ?EKG Interpretation ? ?Date/Time:  Tuesday August 03 2021 18:26:27 EDT ?Ventricular Rate:  105 ?PR Interval:  90 ?QRS Duration: 162 ?QT Interval:  403 ?QTC Calculation: 533 ?R Axis:   -58 ?Text Interpretation: paced rhythm No further analysis attempted due to paced rhythm Confirmed by Malvin Johns [phone]) on 08/03/2021 7:13:28 PM ? ?Radiology ?No results found. ? ?Procedures ?Procedures  ? ? ?Medications Ordered in ED ?Medications  ?heparin ADULT infusion 100 units/mL (25000 units/215m) (1,650 Units/hr Intravenous New Bag/Given 08/03/21 1909)  ?sodium chloride 0.9 % bolus 500 mL (has no administration in time range)  ?heparin bolus via infusion 4,000 Units (4,000 Units Intravenous Bolus from Bag 08/03/21 1909)  ? ? ?ED Course/ Medical Decision Making/ A&P ?  ?                        ?Medical Decision Making ?Amount and/or Complexity of Data Reviewed ?Labs: ordered. ? ?Risk ?Prescription drug  management. ?Decision regarding hospitalization. ? ? ?Patient is a 71year old male who presents with concern for limb ischemia.  He does have a cool foot.  He does have a PT pulse by Doppler.  I spoke with Dr. CDonzetta Mattersand most star

## 2021-08-04 ENCOUNTER — Encounter (HOSPITAL_COMMUNITY)
Admission: EM | Disposition: A | Discharge status: Home Health | Payer: Self-pay | Source: Home / Self Care | Attending: Vascular Surgery

## 2021-08-04 ENCOUNTER — Inpatient Hospital Stay (HOSPITAL_BASED_OUTPATIENT_CLINIC_OR_DEPARTMENT_OTHER): Payer: 59

## 2021-08-04 ENCOUNTER — Inpatient Hospital Stay (HOSPITAL_COMMUNITY): Payer: 59

## 2021-08-04 DIAGNOSIS — M7989 Other specified soft tissue disorders: Secondary | ICD-10-CM | POA: Diagnosis not present

## 2021-08-04 DIAGNOSIS — I82592 Chronic embolism and thrombosis of other specified deep vein of left lower extremity: Secondary | ICD-10-CM

## 2021-08-04 DIAGNOSIS — R609 Edema, unspecified: Secondary | ICD-10-CM

## 2021-08-04 DIAGNOSIS — I743 Embolism and thrombosis of arteries of the lower extremities: Secondary | ICD-10-CM | POA: Diagnosis not present

## 2021-08-04 HISTORY — PX: ABDOMINAL AORTOGRAM W/LOWER EXTREMITY: CATH118223

## 2021-08-04 HISTORY — PX: PERIPHERAL VASCULAR INTERVENTION: CATH118257

## 2021-08-04 LAB — HEPARIN LEVEL (UNFRACTIONATED)
Heparin Unfractionated: 0.3 IU/mL (ref 0.30–0.70)
Heparin Unfractionated: 0.18 IU/mL — ABNORMAL LOW (ref 0.30–0.70)

## 2021-08-04 LAB — PROTIME-INR
INR: 1.1 (ref 0.8–1.2)
Prothrombin Time: 14 seconds (ref 11.4–15.2)

## 2021-08-04 LAB — BASIC METABOLIC PANEL
Anion gap: 9 (ref 5–15)
BUN: 32 mg/dL — ABNORMAL HIGH (ref 8–23)
CO2: 20 mmol/L — ABNORMAL LOW (ref 22–32)
Calcium: 7.6 mg/dL — ABNORMAL LOW (ref 8.9–10.3)
Chloride: 98 mmol/L (ref 98–111)
Creatinine, Ser: 0.8 mg/dL (ref 0.61–1.24)
GFR, Estimated: >60 mL/min (ref >60)
Glucose, Bld: 156 mg/dL — ABNORMAL HIGH (ref 70–99)
Potassium: 3.6 mmol/L (ref 3.5–5.1)
Sodium: 127 mmol/L — ABNORMAL LOW (ref 135–145)

## 2021-08-04 LAB — CBC
HCT: 30.7 % — ABNORMAL LOW (ref 39.0–52.0)
Hemoglobin: 9.9 g/dL — ABNORMAL LOW (ref 13.0–17.0)
MCH: 28.4 pg (ref 26.0–34.0)
MCHC: 32.2 g/dL (ref 30.0–36.0)
MCV: 88 fL (ref 80.0–100.0)
Platelets: 350 10*3/uL (ref 150–400)
RBC: 3.49 MIL/uL — ABNORMAL LOW (ref 4.22–5.81)
RDW: 17.2 % — ABNORMAL HIGH (ref 11.5–15.5)
WBC: 7.6 10*3/uL (ref 4.0–10.5)
nRBC: 0 % (ref 0.0–0.2)

## 2021-08-04 SURGERY — ABDOMINAL AORTOGRAM W/LOWER EXTREMITY
Anesthesia: LOCAL

## 2021-08-04 MED ORDER — FENTANYL CITRATE (PF) 100 MCG/2ML IJ SOLN
INTRAMUSCULAR | Status: DC | PRN
  Administered 2021-08-04: 50 ug via INTRAVENOUS
  Administered 2021-08-04: 25 ug via INTRAVENOUS

## 2021-08-04 MED ORDER — HEPARIN SODIUM (PORCINE) 1000 UNIT/ML IJ SOLN
INTRAMUSCULAR | Status: DC | PRN
  Administered 2021-08-04: 10000 [IU] via INTRAVENOUS

## 2021-08-04 MED ORDER — ONDANSETRON HCL 4 MG/2ML IJ SOLN
INTRAMUSCULAR | Status: DC | PRN
  Administered 2021-08-04 (×2): 4 mg via INTRAVENOUS

## 2021-08-04 MED ORDER — SODIUM CHLORIDE 0.9% FLUSH
3.0000 mL | Freq: Two times a day (BID) | INTRAVENOUS | Status: DC
  Administered 2021-08-04 – 2021-08-10 (×8): 3 mL via INTRAVENOUS

## 2021-08-04 MED ORDER — ONDANSETRON HCL 4 MG/2ML IJ SOLN
INTRAMUSCULAR | Status: AC
  Filled 2021-08-04: qty 2

## 2021-08-04 MED ORDER — HEPARIN (PORCINE) IN NACL 1000-0.9 UT/500ML-% IV SOLN
INTRAVENOUS | Status: AC
  Filled 2021-08-04: qty 1000

## 2021-08-04 MED ORDER — MIDAZOLAM HCL 2 MG/2ML IJ SOLN
INTRAMUSCULAR | Status: AC
  Filled 2021-08-04: qty 2

## 2021-08-04 MED ORDER — ONDANSETRON HCL 4 MG/2ML IJ SOLN: 4.0000 mg | Freq: Four times a day (QID) | INTRAMUSCULAR | Status: DC | PRN

## 2021-08-04 MED ORDER — ASPIRIN EC 81 MG PO TBEC: 81.0000 mg | DELAYED_RELEASE_TABLET | Freq: Every day | ORAL | Status: DC

## 2021-08-04 MED ORDER — SODIUM CHLORIDE 0.9 % WEIGHT BASED INFUSION
1.0000 mL/kg/h | INTRAVENOUS | Status: AC
  Administered 2021-08-04: 1 mL/kg/h via INTRAVENOUS

## 2021-08-04 MED ORDER — SODIUM CHLORIDE 0.9 % IV SOLN: 250.0000 mL | INTRAVENOUS | Status: DC | PRN

## 2021-08-04 MED ORDER — HEPARIN SODIUM (PORCINE) 1000 UNIT/ML IJ SOLN
INTRAMUSCULAR | Status: AC
  Filled 2021-08-04: qty 10

## 2021-08-04 MED ORDER — CLOPIDOGREL BISULFATE 75 MG PO TABS
75.0000 mg | ORAL_TABLET | Freq: Every day | ORAL | Status: DC
  Administered 2021-08-05 – 2021-08-10 (×6): 75 mg via ORAL
  Filled 2021-08-04 (×6): qty 1

## 2021-08-04 MED ORDER — SODIUM CHLORIDE 0.9% FLUSH: 3.0000 mL | INTRAVENOUS | Status: DC | PRN

## 2021-08-04 MED ORDER — FENTANYL CITRATE (PF) 100 MCG/2ML IJ SOLN
INTRAMUSCULAR | Status: AC
  Filled 2021-08-04: qty 2

## 2021-08-04 MED ORDER — ACETAMINOPHEN 325 MG PO TABS: 650.0000 mg | ORAL_TABLET | ORAL | Status: DC | PRN

## 2021-08-04 MED ORDER — HYDRALAZINE HCL 20 MG/ML IJ SOLN: 5.0000 mg | INTRAMUSCULAR | Status: DC | PRN

## 2021-08-04 MED ORDER — LIDOCAINE HCL (PF) 1 % IJ SOLN
INTRAMUSCULAR | Status: DC | PRN
  Administered 2021-08-04: 20 mL

## 2021-08-04 MED ORDER — HEPARIN (PORCINE) IN NACL 1000-0.9 UT/500ML-% IV SOLN
INTRAVENOUS | Status: DC | PRN
Start: 2021-08-04 — End: 2021-08-04
  Administered 2021-08-04 (×2): 500 mL

## 2021-08-04 MED ORDER — MIDAZOLAM HCL 2 MG/2ML IJ SOLN
INTRAMUSCULAR | Status: DC | PRN
  Administered 2021-08-04: 1 mg via INTRAVENOUS
  Administered 2021-08-04: 2 mg via INTRAVENOUS

## 2021-08-04 MED ORDER — LIDOCAINE HCL (PF) 1 % IJ SOLN
INTRAMUSCULAR | Status: AC
  Filled 2021-08-04: qty 30

## 2021-08-04 MED ORDER — LABETALOL HCL 5 MG/ML IV SOLN: 10.0000 mg | INTRAVENOUS | Status: DC | PRN

## 2021-08-04 MED ORDER — IODIXANOL 320 MG/ML IV SOLN
INTRAVENOUS | Status: DC | PRN
  Administered 2021-08-04: 150 mL via INTRA_ARTERIAL

## 2021-08-04 MED ORDER — HEPARIN (PORCINE) 25000 UT/250ML-% IV SOLN
1750.0000 [IU]/h | INTRAVENOUS | Status: DC
  Administered 2021-08-04: 1650 [IU]/h via INTRAVENOUS
  Administered 2021-08-05: 1900 [IU]/h via INTRAVENOUS
  Administered 2021-08-05: 1800 [IU]/h via INTRAVENOUS
  Administered 2021-08-06 – 2021-08-07 (×3): 2100 [IU]/h via INTRAVENOUS
  Administered 2021-08-08: 1900 [IU]/h via INTRAVENOUS
  Administered 2021-08-09: 1750 [IU]/h via INTRAVENOUS
  Filled 2021-08-04 (×9): qty 250

## 2021-08-04 SURGICAL SUPPLY — 26 items
BALLN MUSTANG 5X150X135 (BALLOONS) ×3
BALLOON MUSTANG 5X150X135 (BALLOONS) IMPLANT
CATH OMNI FLUSH 5F 65CM (CATHETERS) ×1 IMPLANT
CATH QUICKCROSS SUPP .035X90CM (MICROCATHETER) ×1 IMPLANT
DEVICE TORQUE H2O (MISCELLANEOUS) ×1 IMPLANT
DEVICE VASC CLSR CELT ART 7 (Vascular Products) ×2 IMPLANT
GLIDEWIRE NITREX 0.018X80X5 (WIRE) ×1
GUIDEWIRE ANGLED .035X150CM (WIRE) ×1 IMPLANT
GUIDEWIRE ANGLED .035X260CM (WIRE) ×1 IMPLANT
GUIDEWIRE NITREX 0.018X80X5 (WIRE) IMPLANT
KIT ENCORE 26 ADVANTAGE (KITS) ×1 IMPLANT
KIT MICROPUNCTURE NIT STIFF (SHEATH) ×1 IMPLANT
KIT PV (KITS) ×3 IMPLANT
SHEATH PINNACLE 5F 10CM (SHEATH) ×1 IMPLANT
SHEATH PINNACLE 7F 10CM (SHEATH) ×1 IMPLANT
SHEATH PINNACLE ST 6F 45CM (SHEATH) ×1 IMPLANT
SHEATH PROBE COVER 6X72 (BAG) ×1 IMPLANT
STENT ELUVIA 6X150X130 (Permanent Stent) ×1 IMPLANT
STENT ELUVIA 6X60X130 (Permanent Stent) ×1 IMPLANT
STOPCOCK MORSE 400PSI 3WAY (MISCELLANEOUS) ×1 IMPLANT
SYR MEDRAD MARK 7 150ML (SYRINGE) ×3 IMPLANT
TRANSDUCER W/STOPCOCK (MISCELLANEOUS) ×3 IMPLANT
TRAY PV CATH (CUSTOM PROCEDURE TRAY) ×3 IMPLANT
TUBING CIL FLEX 10 FLL-RA (TUBING) ×1 IMPLANT
WIRE BENTSON .035X145CM (WIRE) ×1 IMPLANT
WIRE ROSEN-J .035X260CM (WIRE) ×1 IMPLANT

## 2021-08-04 NOTE — Progress Notes (Signed)
ANTICOAGULATION CONSULT NOTE ? ?Pharmacy Consult for heparin ?Indication:  acute limb ischemia ? ?No Known Allergies ? ?Patient Measurements: ?Height: 6' (182.9 cm) ?Weight: 88.8 kg (195 lb 12.3 oz) ?IBW/kg (Calculated) : 77.6 ?Heparin Dosing Weight: 97 kg  ? ?Vital Signs: ?Temp: 97.7 ?F (36.5 ?C) (04/26 1410) ?Temp Source: Oral (04/26 1410) ?BP: 109/63 (04/26 1410) ?Pulse Rate: 98 (04/26 1410) ? ?Labs: ?Recent Labs  ?  08/03/21 ?1911 08/03/21 ?1919 08/04/21 ?0327 08/04/21 ?0330  ?HGB 10.9* 12.2* 9.9*  --   ?HCT 34.4* 36.0* 30.7*  --   ?PLT 361  --  350  --   ?LABPROT 12.9  --  14.0  --   ?INR 1.0  --  1.1  --   ?HEPARINUNFRC  --   --   --  0.30  ?CREATININE 0.82 0.80 0.80  --   ? ? ?Estimated Creatinine Clearance: 91.6 mL/min (by C-G formula based on SCr of 0.8 mg/dL). ? ? ?Medical History: ?Past Medical History:  ?Diagnosis Date  ? BPH (benign prostatic hyperplasia)   ? CLL (chronic lymphocytic leukemia) ([place])   ? Hypertension   ? Pacemaker   ? Medtronic Device  ? ? ?Medications:  ?Medications Prior to Admission  ?Medication Sig Dispense Refill Last Dose  ? albuterol (PROVENTIL) (2.5 MG/3ML) 0.083% nebulizer solution Take 2.5 mg by nebulization every 6 (six) hours as needed for wheezing or shortness of breath.   08/03/2021  ? albuterol (VENTOLIN HFA) 108 (90 Base) MCG/ACT inhaler Inhale 2 puffs into the lungs 4 (four) times daily. 8.5 g 0 unknown  ? amLODipine (NORVASC) 10 MG tablet Take 1 tablet (10 mg total) by mouth daily. 30 tablet 0 08/02/2021  ? ascorbic acid (VITAMIN C) 1000 MG tablet Take 1 tablet (1,000 mg total) by mouth daily. 30 tablet 0 08/03/2021  ? docusate sodium (COLACE) 100 MG capsule Take 1 capsule (100 mg total) by mouth 2 (two) times daily. (Patient taking differently: Take 100 mg by mouth daily.) 10 capsule 0 08/02/2021  ? finasteride (PROSCAR) 5 MG tablet Take 1 tablet (5 mg total) by mouth daily. 30 tablet 0 08/02/2021  ? gabapentin (NEURONTIN) 300 MG capsule Take 1 capsule (300 mg total) by  mouth at bedtime. 30 capsule 0 08/02/2021  ? IMBRUVICA 420 MG tablet TAKE 1 TABLET BY MOUTH ONCE  DAILY WITH A FULL GLASS OF WATER (Patient taking differently: 420 mg daily. WITH A FULL GLASS OF  WATER) 28 tablet 1 08/03/2021 at 1200  ? melatonin 5 MG TABS Take 1 tablet (5 mg total) by mouth at bedtime. 30 tablet 0 08/02/2021  ? methocarbamol (ROBAXIN) 500 MG tablet Take 1 tablet (500 mg total) by mouth 3 (three) times daily. (Patient taking differently: Take 500 mg by mouth every 8 (eight) hours as needed for muscle spasms.) 90 tablet 0 Past Week  ? oxyCODONE (OXY IR/ROXICODONE) 5 MG immediate release tablet Take 1 tablet (5 mg total) by mouth every 4 (four) hours as needed for moderate pain. 30 tablet 0 Past Week  ? oxymetazoline (AFRIN) 0.05 % nasal spray Place 1 spray into both nostrils 2 (two) times daily as needed for congestion.   unknown  ? terazosin (HYTRIN) 5 MG capsule Take 3 capsules (15 mg total) by mouth at bedtime. (Patient taking differently: Take 15 mg by mouth in the morning.) 90 capsule 0 08/03/2021  ? valsartan-hydrochlorothiazide (DIOVAN-HCT) 320-25 MG tablet Take 1 tablet by mouth daily.   08/03/2021  ? aspirin EC 81 MG EC tablet Take  1 tablet (81 mg total) by mouth daily. Swallow whole. (Patient not taking: Reported on 08/03/2021) 30 tablet 11 Completed Course  ? irbesartan (AVAPRO) 300 MG tablet Take 1 tablet (300 mg total) by mouth daily. (Patient not taking: Reported on 08/03/2021) 30 tablet 0 Completed Course  ? metoprolol tartrate (LOPRESSOR) 25 MG tablet Take 1 tablet (25 mg total) by mouth every 8 (eight) hours. (Patient not taking: Reported on 08/03/2021) 90 tablet 0 Completed Course  ? montelukast (SINGULAIR) 10 MG tablet Take 1 tablet (10 mg total) by mouth daily as needed (allergies). (Patient not taking: Reported on 08/03/2021) 10 tablet 0 Completed Course  ? Multiple Vitamin (MULTIVITAMIN WITH MINERALS) TABS tablet Take 1 tablet by mouth daily. (Patient not taking: Reported on 08/03/2021)    Completed Course  ? pantoprazole (PROTONIX) 40 MG tablet Take 1 tablet (40 mg total) by mouth 2 (two) times daily. (Patient not taking: Reported on 08/03/2021) 60 tablet 0 Completed Course  ? polyethylene glycol (MIRALAX / GLYCOLAX) 17 g packet Take 17 g by mouth 2 (two) times daily. (Patient not taking: Reported on 08/03/2021) 14 each 0 Completed Course  ? rosuvastatin (CRESTOR) 20 MG tablet Take 1 tablet (20 mg total) by mouth daily. (Patient not taking: Reported on 08/03/2021) 30 tablet 0 Completed Course  ? ? 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 Pharmacy consulted to start IV heparin limb ischemia to the L foot.  ? ?Pt s/p cath lab for PV angio - discussed with Dr. Trula Slade and will resume heparin drip with no bolus at 1700 tonight. ? ?Goal of Therapy:  ?Heparin level 0.3-0.7 units/ml ?Monitor platelets by anticoagulation protocol: Yes ?  ?Plan:  ?-Resume heparin 1650 units/h (prior rate) no bolus at 1700 ?-Check heparin level 6h after starting ? ?Arrie Senate, PharmD, BCPS, BCCP ?Clinical Pharmacist ?[phone] ?Please check AMION for all [facility] numbers ?08/04/2021 ? ? ?

## 2021-08-04 NOTE — Progress Notes (Signed)
IVC/iliac vein duplex completed. ?Refer to "CV Proc" under chart review to view preliminary results. ? ?Preliminary results discussed with Dr. Donzetta Matters. ? ?08/04/2021 9:55 AM ?Kelby Aline., MHA, RVT, RDCS, RDMS   ?

## 2021-08-04 NOTE — Progress Notes (Signed)
Lower extremity venous has been completed.  ? ?Preliminary results in CV Proc.  ? ?Severina Sykora Sorrel Cassetta ?08/04/2021 9:30 AM    ?

## 2021-08-04 NOTE — Progress Notes (Signed)
PT Cancellation Note ? ?Patient Details ?Name: Douglas [name] ?MRN: [id number] ?DOB: 08-Jun-1950 ? ? ?Cancelled Treatment:    Reason Eval/Treat Not Completed: Active bedrest order. PT will follow up as time allows. ? ? ?Zenaida Niece ?08/04/2021, 3:36 PM ?

## 2021-08-04 NOTE — Progress Notes (Signed)
Patient brought to 4E from Cath Lab. VSS. Telemetry box applied, CCMD notified. Patient stated 0/10 pain. Right groin site dressing clean, dry, intact. Patient oriented to room and staff. Call bell in reach. ? ?Daymon Larsen, RN  ?

## 2021-08-04 NOTE — Progress Notes (Signed)
ANTICOAGULATION CONSULT NOTE ?Pharmacy Consult for heparin ?Indication:  acute limb ischemia ?Brief A/P: Heparin level within goal range Continue Heparin at current rate ? ?No Known Allergies ? ?Patient Measurements: ?Height: 6' (182.9 cm) ?Weight: 99.8 kg (220 lb) ?IBW/kg (Calculated) : 77.6 ?Heparin Dosing Weight: 97 kg  ? ?Vital Signs: ?Temp: 97.4 ?F (36.3 ?C) (04/25 1843) ?Temp Source: Oral (04/25 1843) ?BP: 87/69 (04/26 0330) ?Pulse Rate: 97 (04/26 0330) ? ?Labs: ?Recent Labs  ?  08/03/21 ?1911 08/03/21 ?1919 08/04/21 ?0327 08/04/21 ?0330  ?HGB 10.9* 12.2* 9.9*  --   ?HCT 34.4* 36.0* 30.7*  --   ?PLT 361  --  350  --   ?LABPROT 12.9  --  14.0  --   ?INR 1.0  --  1.1  --   ?HEPARINUNFRC  --   --   --  0.30  ?CREATININE 0.82 0.80 0.80  --   ? ? ?Estimated Creatinine Clearance: 102.1 mL/min (by C-G formula based on SCr of 0.8 mg/dL). ?Assessment: ?71 yo male who presents with LLE ischemia for heparin ?Goal of Therapy:  ?Heparin level 0.3-0.7 units/ml ?Monitor platelets by anticoagulation protocol: Yes ?  ?Plan:  ?Continue Heparin at current rate  ?F/U after OR today ? ?Phillis Knack, PharmD, BCPS ? ? ?

## 2021-08-04 NOTE — Op Note (Signed)
? ? ?Patient name: Douglas [name] MRN: [id number] DOB: May 22, 1950 Sex: male ? ?08/04/2021 ?Pre-operative Diagnosis: Left foot pain ?Post-operative diagnosis:  Same ?Surgeon:  Annamarie Major ?Procedure Performed: ? 1.  Ultrasound-guided access, right femoral artery ? 2.  Abdominal aortogram ? 3.  Left lower extremity runoff ? 4.  Stent, left superficial femoral artery ? 5.  Conscious sedation, 71 minutes ? 6.  Closure device, Celt ? ? ? ?Indications: This is a 71 year old gentleman recently status post right leg amputation who presented to the emergency department with swelling and pain in his left foot.  He has a known superficial femoral artery occlusion.  He was also diagnosed with a DVT.  He comes in today for arterial evaluation. ? ?Procedure:  The patient was identified in the holding area and taken to room 8.  The patient was then placed supine on the table and prepped and draped in the usual sterile fashion.  A time out was called.  Conscious sedation was administered with the use of IV fentanyl and Versed under continuous physician and nurse monitoring.  Heart rate, blood pressure, and oxygen saturation were continuously monitored.  Total sedation time was 71 minutes.  Ultrasound was used to evaluate the right common femoral artery.  It was patent .  A digital ultrasound image was acquired.  A micropuncture needle was used to access the right common femoral artery under ultrasound guidance.  An 018 wire was advanced without resistance and a micropuncture sheath was placed.  The 018 wire was removed and a benson wire was placed.  The micropuncture sheath was exchanged for a 5 french sheath.  An omniflush catheter was advanced over the wire to the level of L-1.  An abdominal angiogram was obtained.  Next, using the omniflush catheter and a benson wire, the aortic bifurcation was crossed and the catheter was placed into theleft external iliac artery and left runoff was obtained.   ? ?Findings:  ? Aortogram: No  significant renal artery stenosis was visualized.  The infrarenal abdominal was widely patent.  Bilateral common and external iliac arteries widely patent. ? Right Lower Extremity: Not evaluated. ? Left Lower Extremity: The left common femoral and profundofemoral artery are patent without significant stenosis.  There is approximately a 15 cm occlusion of the superficial femoral artery with reconstitution of the posterior tibial artery.  There is two-vessel runoff via the posterior tibial and peroneal artery. ? ?Intervention: After the above images were acquired the decision was made to proceed with intervention.  A 6 French 45 cm sheath was advanced into the left external iliac artery.  The patient was fully heparinized.  Next, using an 035 Glidewire and a quick cross catheter, subintimal recanalization was performed.  Reentry was performed near the crossing of the femur.  A contrast injection was performed with a quick cross catheter, confirming successful crossing of the lesion.  Next, I dilated the subintimal tract with a 5 mm balloon and then placed 2 overlapping Elluvia stents.  These were a 6 x 150 and a 6 x 60.  They were postdilated with a 5 mm balloon.  Completion imaging was then performed.  This showed inline flow through the posterior tibial artery which completed the plantar arch.  There did appear to be embolization into the peroneal artery.  Next, I advanced a Glidewire down the peroneal artery without significant difficulty.  I then performed additional imaging that now showed he had three-vessel runoff.  I then closed the right groin with a 7  [place] Celt ? ?Impression: ? #1 occluded left superficial femoral artery, successfully crossed and stented using overlapping 6 mm Elluvia stents ? #2 patient will be started on aspirin and Plavix.  I will also continue his IV heparin given his DVT.  He may require treatment of his DVT if he continues to have issues. ?  ? ? ?V. Annamarie Major, M.D.,  FACS ?[facility]  ?Office: [phone] ?Pager:  [phone]  ?

## 2021-08-04 NOTE — ED Notes (Signed)
Patient transported to Vascular Lab. ?

## 2021-08-04 NOTE — Progress Notes (Signed)
?  Progress Note ? ? ? ?08/04/2021 ?8:26 AM ?* No surgery date entered * ? ?Subjective: Foot is feeling better on heparin ? ?Vitals:  ? 08/04/21 0715 08/04/21 0800  ?BP: 117/67 96/65  ?Pulse: 83 82  ?Resp: 13 18  ?Temp:    ?SpO2: 96% 99%  ? ? ?Physical Exam: ?Awake alert oriented ?Nonlabored respirations ?Abdomen is soft ?Right groin incision healing well ?Right above-knee amputation site healing well with staples in place ?Left foot is warm there is persistent swelling of the left calf with no pain with passive motion. ? ?CBC ?   ?Component Value Date/Time  ? WBC 7.6 08/04/2021 0327  ? RBC 3.49 (L) 08/04/2021 0327  ? HGB 9.9 (L) 08/04/2021 0327  ? HGB 14.9 05/04/2021 0752  ? HCT 30.7 (L) 08/04/2021 0327  ? PLT 350 08/04/2021 0327  ? PLT 352 05/04/2021 0752  ? MCV 88.0 08/04/2021 0327  ? MCH 28.4 08/04/2021 0327  ? MCHC 32.2 08/04/2021 0327  ? RDW 17.2 (H) 08/04/2021 0327  ? LYMPHSABS 2.6 08/03/2021 1911  ? MONOABS 0.4 08/03/2021 1911  ? EOSABS 0.0 08/03/2021 1911  ? BASOSABS 0.1 08/03/2021 1911  ? ? ?BMET ?   ?Component Value Date/Time  ? NA 127 (L) 08/04/2021 0327  ? K 3.6 08/04/2021 0327  ? CL 98 08/04/2021 0327  ? CO2 20 (L) 08/04/2021 0327  ? GLUCOSE 156 (H) 08/04/2021 0327  ? BUN 32 (H) 08/04/2021 0327  ? CREATININE 0.80 08/04/2021 0327  ? CREATININE 0.94 05/04/2021 0752  ? CALCIUM 7.6 (L) 08/04/2021 0327  ? GFRNONAA >60 08/04/2021 0327  ? GFRNONAA >60 05/04/2021 0752  ? GFRAA >60 01/08/2020 0810  ? ? ?INR ?   ?Component Value Date/Time  ? INR 1.1 08/04/2021 0327  ? ? ? ?Intake/Output Summary (Last 24 hours) at 08/04/2021 0826 ?Last data filed at 08/04/2021 0449 ?Gross per 24 hour  ?Intake 500 ml  ?Output --  ?Net 500 ml  ? ? ? ?Assessment:  71 y.o. male previous right above-knee amputation now with left leg pain with known left lower extremity occlusion but now with new swelling. ? ?Plan: ?Dvt study lle ?Continue heparin ?Angiogram today possible treatment of SFA occlusion today in the Cath Lab with Dr.  Virl Cagey. ? ?Urban Naval C. Donzetta Matters, MD ?[facility] ?Office: [phone] ?Pager: [phone] ? ?08/04/2021 ?8:26 AM ? ?

## 2021-08-04 NOTE — ED Notes (Signed)
Breakfast order placed ?

## 2021-08-05 ENCOUNTER — Other Ambulatory Visit (HOSPITAL_COMMUNITY): Payer: Self-pay

## 2021-08-05 ENCOUNTER — Observation Stay (HOSPITAL_COMMUNITY): Payer: 59

## 2021-08-05 ENCOUNTER — Encounter (HOSPITAL_COMMUNITY): Payer: Self-pay | Admitting: Surgery

## 2021-08-05 DIAGNOSIS — I739 Peripheral vascular disease, unspecified: Secondary | ICD-10-CM | POA: Diagnosis not present

## 2021-08-05 DIAGNOSIS — I959 Hypotension, unspecified: Secondary | ICD-10-CM | POA: Diagnosis not present

## 2021-08-05 LAB — BASIC METABOLIC PANEL
Anion gap: 8 (ref 5–15)
BUN: 34 mg/dL — ABNORMAL HIGH (ref 8–23)
CO2: 21 mmol/L — ABNORMAL LOW (ref 22–32)
Calcium: 8.5 mg/dL — ABNORMAL LOW (ref 8.9–10.3)
Chloride: 99 mmol/L (ref 98–111)
Creatinine, Ser: 0.76 mg/dL (ref 0.61–1.24)
GFR, Estimated: >60 mL/min (ref >60)
Glucose, Bld: 129 mg/dL — ABNORMAL HIGH (ref 70–99)
Potassium: 4.2 mmol/L (ref 3.5–5.1)
Sodium: 128 mmol/L — ABNORMAL LOW (ref 135–145)

## 2021-08-05 LAB — HEPARIN LEVEL (UNFRACTIONATED): Heparin Unfractionated: 0.3 IU/mL (ref 0.30–0.70)

## 2021-08-05 LAB — CBC
HCT: 30.2 % — ABNORMAL LOW (ref 39.0–52.0)
Hemoglobin: 9.5 g/dL — ABNORMAL LOW (ref 13.0–17.0)
MCH: 27.8 pg (ref 26.0–34.0)
MCHC: 31.5 g/dL (ref 30.0–36.0)
MCV: 88.3 fL (ref 80.0–100.0)
Platelets: 367 10*3/uL (ref 150–400)
RBC: 3.42 MIL/uL — ABNORMAL LOW (ref 4.22–5.81)
RDW: 17.1 % — ABNORMAL HIGH (ref 11.5–15.5)
WBC: 5.8 10*3/uL (ref 4.0–10.5)
nRBC: 0 % (ref 0.0–0.2)

## 2021-08-05 LAB — MAGNESIUM: Magnesium: 2 mg/dL (ref 1.7–2.4)

## 2021-08-05 LAB — LIPID PANEL
Cholesterol: 128 mg/dL (ref 0–200)
HDL: 37 mg/dL — ABNORMAL LOW (ref >40)
LDL Cholesterol: 72 mg/dL (ref 0–99)
Total CHOL/HDL Ratio: 3.5 RATIO
Triglycerides: 96 mg/dL (ref <150)
VLDL: 19 mg/dL (ref 0–40)

## 2021-08-05 LAB — HEMOGLOBIN A1C
Hgb A1c MFr Bld: 6.1 % — ABNORMAL HIGH (ref 4.8–5.6)
Mean Plasma Glucose: 128.37 mg/dL

## 2021-08-05 MED ORDER — IBRUTINIB 420 MG PO TABS: 420.0000 mg | ORAL_TABLET | Freq: Every day | ORAL | Status: DC

## 2021-08-05 MED ORDER — IOHEXOL 350 MG/ML SOLN
65.0000 mL | Freq: Once | INTRAVENOUS | Status: AC | PRN
  Administered 2021-08-05: 65 mL via INTRAVENOUS

## 2021-08-05 MED ORDER — ALBUTEROL SULFATE (2.5 MG/3ML) 0.083% IN NEBU
2.5000 mg | INHALATION_SOLUTION | Freq: Four times a day (QID) | RESPIRATORY_TRACT | Status: DC
  Administered 2021-08-06: 2.5 mg via RESPIRATORY_TRACT
  Filled 2021-08-05: qty 3

## 2021-08-05 MED ORDER — ALBUTEROL SULFATE (2.5 MG/3ML) 0.083% IN NEBU
2.5000 mg | INHALATION_SOLUTION | Freq: Four times a day (QID) | RESPIRATORY_TRACT | Status: DC
Start: 2021-08-05 — End: 2021-08-05
  Administered 2021-08-05: 2.5 mg via RESPIRATORY_TRACT
  Filled 2021-08-05: qty 3

## 2021-08-05 MED ORDER — SODIUM CHLORIDE 0.9 % IV BOLUS
1000.0000 mL | INTRAVENOUS | Status: AC
  Administered 2021-08-05: 1000 mL via INTRAVENOUS

## 2021-08-05 MED ORDER — CHLORHEXIDINE GLUCONATE CLOTH 2 % EX PADS
6.0000 | MEDICATED_PAD | Freq: Every day | CUTANEOUS | Status: DC
  Administered 2021-08-05 – 2021-08-10 (×6): 6 via TOPICAL

## 2021-08-05 NOTE — Progress Notes (Signed)
While taking midnight vitals, SBP was in 80's, 84/50. Repeat BP was about same. Placed pt in trendelenburg position for about 10 min. Fluid at 99.8 ml/hr. Pt was non symptomatic, AOx4, talking. BP came up to 100/49, 112/55, 111/52. Patient on Q30 min cycle. Head of bed back to 30 degree. 2l o2 applied as o2  sat was dropping to 70-80's and coming back up to 90's while pt was asleep. When asked if he has sleep apnea, pt stated may be he does. He has not been tested for it. Resting in bed, denies sob or dizziness. Call bell within reach. Will continue to monitor.  ?

## 2021-08-05 NOTE — Evaluation (Signed)
Physical Therapy Evaluation ?Patient Details ?Name: Douglas [name] ?MRN: [id number] ?DOB: Oct 18, 1950 ?Today's Date: 08/05/2021 ? ?History of Present Illness ? Pt is a 71 y.o. male who presented 08/03/21 with worsening L foot pain and edema. Pt with age indeterminate bil lower extremity DVTs. S/p abdominal aortogram with L superficial femoral artery stent 4/26. Hospital stay complicated by hypotension. 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 PMH: pacemaker, DM, BPH, HTN, and CLL. ?  ?Clinical Impression ? Pt presents with condition above and deficits mentioned below, see PT Problem List. He was recently discharged home 07/23/21 after a prolonged hospital stay in which he received a R BKA followed by going to AIR then returned to the acute side of the hospital for R AKA. Since discharging home, he has not performing sliding board transfers yet, but was working with therapy on bed mobility and EOB sitting balance and strengthening, needing assistance for all bed mobility. He lives with his wife in a 2-level house with 3 STE and will need transportation services at d/c to assist him into his home. Currently, pt is limited in mobility by deficits in L lower extremity sensation, R hip extension ROM, bil shoulder ROM, and gross overall strength. Pt declining to sit EOB or get OOB today due to anxiety in regards to low BP overnight, thus focused session on strengthening exercises in bed. Pt's BP did decrease from 102/59 supine to 83/50 when [name] was elevated to 45 degrees. His SBP decreased further to the 70s but then eventually improved to the 90s with prolonged sitting with HOB at 45 degrees. Coordinated with RN for pt to remain with HOB elevated to try to condition his body to respond appropriately to changes in position. RN monitoring pt. Will continue to follow acutely. Recommend pt return home with continued HHPT services to address his deficits to  maximize his independence and safety with all functional mobility. Pt politely declining rehab at a facility prior to returning home. ?   ? ?Recommendations for follow up therapy are one component of a multi-disciplinary discharge planning process, led by the attending physician.  Recommendations may be updated based on patient status, additional functional criteria and insurance authorization. ? ?Follow Up Recommendations Home health PT ? ?  ?Assistance Recommended at Discharge Frequent or constant Supervision/Assistance  ?Patient can return home with the following ? Assistance with cooking/housework;Assist for transportation;Help with stairs or ramp for entrance;A lot of help with bathing/dressing/bathroom;A lot of help with walking and/or transfers ? ?  ?Equipment Recommendations None recommended by PT (will need transportation services into home)  ?Recommendations for Other Services ?    ?  ?Functional Status Assessment Patient has had a recent decline in their functional status and demonstrates the ability to make significant improvements in function in a reasonable and predictable amount of time.  ? ?  ?Precautions / Restrictions Precautions ?Precautions: Fall ?Precaution Comments: R AKA 07/19/21; watch BP (low) ?Restrictions ?Weight Bearing Restrictions: Yes ?RLE Weight Bearing: Non weight bearing  ? ?  ? ?Mobility ? Bed Mobility ?Overal bed mobility: Needs Assistance ?  ?  ?  ?  ?  ?  ?General bed mobility comments: TAx2 to transition pt superiorly in bed using bed pads. Pt able to lift back off HOB through pulling on bil bed rails with HOB at 45 degree angle without physical assistance. ?  ? ?Transfers ?  ?  ?  ?  ?  ?  ?  ?  ?  ?  General transfer comment: pt politely declined sitting EOB or OOB mobility today ?  ? ?Ambulation/Gait ?  ?  ?  ?  ?  ?  ?  ?General Gait Details: unable ? ?Stairs ?  ?  ?  ?  ?  ? ?Wheelchair Mobility ?  ? ?Modified Rankin (Stroke Patients Only) ?  ? ?  ? ?Balance Overall balance  assessment: Needs assistance ?Sitting-balance support: Feet unsupported, Bilateral upper extremity supported ?Sitting balance-Leahy Scale: Poor ?Sitting balance - Comments: Pt able to lift trunk off elevated HOB pulling on bil bed rails. ?  ?  ?  ?Standing balance comment: unable ?  ?  ?  ?  ?  ?  ?  ?  ?  ?  ?  ?   ? ? ? ?Pertinent Vitals/Pain Pain Assessment ?Pain Assessment: Faces ?Faces Pain Scale: Hurts a little bit ?Pain Location: grimacing with end AROM of shoulders ?Pain Descriptors / Indicators: Grimacing ?Pain Intervention(s): Monitored during session, Limited activity within patient's tolerance, Repositioned  ? ? ?Home Living Family/patient expects to be discharged to:: Private residence ?Living Arrangements: Spouse/significant other ?Available Help at Discharge: Family;Available 24 hours/day ?Type of Home: House ?Home Access: Stairs to enter ?Entrance Stairs-Rails: Left ?Entrance Stairs-Number of Steps: 3 ?Alternate Level Stairs-Number of Steps: 15 ?Home Layout: 1/2 bath on main level;Bed/bath upstairs;Two level ?Home Equipment: Hospital bed;Wheelchair - manual;BSC/3in1;Other (comment) (rents hospital bed; sliding board; bedside commode with drop arms) ?   ?  ?Prior Function Prior Level of Function : Needs assist ?  ?  ?  ?Physical Assist : Mobility (physical);ADLs (physical) ?Mobility (physical): Bed mobility;Transfers ?ADLs (physical): Bathing;Dressing;Toileting ?Mobility Comments: Pt was in AIR 07/01/21 - 07/19/21 s/p R BKA 06/25/21 and was working on sliding board transfers. Once home 07/23/21 s/p R AKA 07/19/21, pt was working on sitting balance EOB with HHPT. At home, needed assistance with bed mobility, even rolling due to weakness in core. Could sit EOB 30-60 min at a time. Pt had not performed sliding board transfer with wife since d/c home. ?ADLs Comments: Wife assisted with bathing and dressing in hospital bed. Pt using bed pan or depends. Called emergency services for transport in and out of  home. ?  ? ? ?Hand Dominance  ?   ? ?  ?Extremity/Trunk Assessment  ? Upper Extremity Assessment ?Upper Extremity Assessment: Defer to OT evaluation ?  ? ?Lower Extremity Assessment ?Lower Extremity Assessment: LLE deficits/detail;RLE deficits/detail ?RLE Deficits / Details: s/p R AKA 07/19/21 with pt reporting intact sensation to light touch; staples noted at incision site for AKA with nice cone shaping of residual limb; likely limited hip extension ROM ?LLE Deficits / Details: Decreased sensation to touch but detects touch from the knee down to the foot; noted erythema at L heel; edema noted; generalized weakness throughout ?LLE Sensation: decreased light touch ?  ? ?Cervical / Trunk Assessment ?Cervical / Trunk Assessment: Normal  ?Communication  ? Communication: No difficulties  ?Cognition Arousal/Alertness: Awake/alert ?Behavior During Therapy: Anxious, WFL for tasks assessed/performed ?Overall Cognitive Status: Within Functional Limits for tasks assessed ?  ?  ?  ?  ?  ?  ?  ?  ?  ?  ?  ?  ?  ?  ?  ?  ?General Comments: Anxious in regards to mobility with recent low BP episode, needing encouragement to begin mobility. ?  ?  ? ?  ?General Comments General comments (skin integrity, edema, etc.): BP: Supine 102/59, 83/50 with HOB elevated to 45 degrees,  decreased to SBP in 70s after several min with HOB elevated, increased to SBP in 90s after several more min with HOB elevated (RN aware and monitoring pt) ? ?  ?Exercises General Exercises - Upper Extremity ?Shoulder Flexion: AROM, Strengthening, Both, 10 reps, Supine ?General Exercises - Lower Extremity ?Heel Slides: AAROM, Left, 10 reps, Supine ?Other Exercises ?Other Exercises: L hip internal and external rotation 10x supine ?Other Exercises: Bridges on L leg 5x supine ?Other Exercises: modified sit-ups with pt pulling trunk off HOB with bil hands on bed rails, 5x, HOB 45 degrees  ? ?Assessment/Plan  ?  ?PT Assessment Patient needs continued PT services  ?PT  Problem List Decreased strength;Decreased activity tolerance;Decreased balance;Decreased mobility;Impaired sensation;Decreased range of motion;Decreased skin integrity;Cardiopulmonary status limiting activity ? ?

## 2021-08-05 NOTE — Consult Note (Signed)
?Cardiology Consultation:  ? ?Patient ID: Douglas [name] ?MRN: [id number]; DOB: 10/03/1950 ? ?Admit date: 08/03/2021 ?Date of Consult: 08/05/2021 ? ?PCP:  [name], MD ?  ?[facility] Providers ?Cardiologist:  Dr Alecia Lemming ? ? ?Patient Profile:  ? ?Douglas [name] is a 71 y.o. 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 ? ?History of Present Illness:  ? ?Mr. [name] with above PMH presented to hospital due to left foot pain. ? ?He initially presented to [facility] 01/16/21 for dizziness and chest pain, admitted for NSTEMI. Ultimately underwent cardiac cath on 01/18/21 showed mid LAD 20% stenosis and normal LV filling pressure. He was also noted with sinus rhythm with first degree AVB and bundle branch block, with runs of complete/high grade heart block on telemetry. EP was consulted and patient underwent MDT dual chamber PPM implantation by Dr Lovena Le on 01/19/21.  ? ?She was last seen by Dr Lovena Le on 06/28/21 for inpatient consultation for NSVT when he was hospitalized for critical limb ischemia. Telemetry showed paroxysmal atrial tachycardia and otherwise A sensed V paced rhythm. PPM function normal. He was added metoprolol for NSVT and felt acute illness with electrolytes imbalance were leading to his cardiac irritability.  ? 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 He presented to [facility] 08/03/21 c/o new onset of left foot pain for a few days. He was admitted under vascular surgery service, reportedly has known SFA occlusion, found to have extensive LLE DVT. He was started on heparin gtt and underwent angiogram 08/04/21 and was treated with a left SFA stenting. He  had worsening edema of left leg and venous thrombectomy is planned tomorrow 08/06/21 due to extensive DVT.  POD #0 overnight he was noted hypotensive with BP down to 82/51, metoprolol was held and cardiology is consulted today for further recommendation.  ? ?Upon encounter, he seems worried. He reports low BP with average 100/50s at home for a few weeks now. He has not had any chest pain, SOB, dizziness, syncope at home. Last night his BP was low at 80/50s where he did feel unwell. He states he is feeling better now again. He reports not taking amlodipine and metoprolol prior to this admission, was only taking valsartan at home. He has been eating ok since his surgery and was NPO before that. His wife states he has not been able to do much but laying in bed since his right AKA. He denied any fever, chills, coughing, vomiting, diarrhea. He has not been able to void and has a chronic Foley in place and suppose to be changed today by visiting nurse.  ? ?Diagnostic workup since admission showed chroninc hyponatremia 125 >127, CBC with Hgb 9.9 from 11-12 ranges, INR 1.1. EKG from 08/03/21 is V paced rhythm. Venous doppler from 08/04/21 showed age indeterminate DVT of right common femoral vein and SF junction; age indeterminate DVT of left common femoral vein, SF junction, left femoral vein, left proximal profunda vein, left popliteal vein, and left  ?gastrocnemius vein. ? ? ? ?Past Medical History:  ?Diagnosis Date  ? BPH (benign prostatic hyperplasia)   ? CLL (chronic lymphocytic leukemia) ([place])   ? Hypertension   ?  Pacemaker   ? Medtronic Device  ? ? ?Past Surgical History:  ?Procedure Laterality Date  ? ABDOMINAL AORTOGRAM W/LOWER EXTREMITY Bilateral 06/21/2021  ? Procedure: ABDOMINAL AORTOGRAM W/LOWER EXTREMITY;  Surgeon: Waynetta Sandy, MD;  Location: [facility];  Service: Cardiovascular;  Laterality: Bilateral;  ? ABDOMINAL AORTOGRAM W/LOWER EXTREMITY N/A 08/04/2021  ? Procedure: ABDOMINAL AORTOGRAM  W/LOWER EXTREMITY;  Surgeon: Serafina Mitchell, MD;  Location: [facility];  Service: Cardiovascular;  Laterality: N/A;  ? AMPUTATION Right 06/25/2021  ? Procedure: RIGHT BELOW KNEE AMPUTATION;  Surgeon: Waynetta Sandy, MD;  Location: [facility];  Service: Vascular;  Laterality: Right;  ? AMPUTATION Right 07/19/2021  ? Procedure: AMPUTATION ABOVE KNEE;  Surgeon: Angelia Mould, MD;  Location: [facility];  Service: Vascular;  Laterality: Right;  ? FEMORAL-POPLITEAL BYPASS GRAFT Right 06/22/2021  ? Procedure: RIGHT FEMORAL-POPLITEAL BYPASS WITH VEIN, RIGHT POPLITEAL THROMBECTOMY;  Surgeon: Waynetta Sandy, MD;  Location: [facility];  Service: Vascular;  Laterality: Right;  ? LEFT HEART CATH AND CORONARY ANGIOGRAPHY N/A 01/18/2021  ? Procedure: LEFT HEART CATH AND CORONARY ANGIOGRAPHY;  Surgeon: Burnell Blanks, MD;  Location: [facility] [facility] [facility];  Service: Cardiovascular;  Laterality: N/A;  ? PACEMAKER IMPLANT N/A 01/19/2021  ? Procedure: PACEMAKER IMPLANT;  Surgeon: Evans Lance, MD;  Location: [facility];  Service: Cardiovascular;  Laterality: N/A;  ? PERIPHERAL VASCULAR INTERVENTION  08/04/2021  ? Procedure: PERIPHERAL VASCULAR INTERVENTION;  Surgeon: Serafina Mitchell, MD;  Location: [place] [facility];  Service: Cardiovascular;;  ?  ? ?Home Medications:  ?Prior to Admission medications   ?Medication Sig Start Date End Date Taking? Authorizing Provider  ?albuterol (PROVENTIL) (2.5 MG/3ML) 0.083% nebulizer solution Take 2.5 mg by nebulization every 6 (six) hours as needed for wheezing or shortness of breath.   Yes [provider]  ?albuterol (VENTOLIN HFA) 108 (90 Base) MCG/ACT inhaler Inhale 2 puffs into the lungs 4 (four) times daily. 07/15/21  Yes Angiulli, Lavon Paganini, PA-C  ?amLODipine (NORVASC) 10 MG tablet Take 1 tablet (10 mg total) by mouth daily. 07/15/21  Yes Angiulli, Lavon Paganini, PA-C  ?ascorbic acid (VITAMIN C) 1000 MG tablet Take 1 tablet (1,000 mg total) by mouth  daily. 07/15/21  Yes Angiulli, Lavon Paganini, PA-C  ?docusate sodium (COLACE) 100 MG capsule Take 1 capsule (100 mg total) by mouth 2 (two) times daily. ?Patient taking differently: Take 100 mg by mouth daily. 07/15/21  Yes Angiulli, Lavon Paganini, PA-C  ?finasteride (PROSCAR) 5 MG tablet Take 1 tablet (5 mg total) by mouth daily. 07/15/21  Yes Angiulli, Lavon Paganini, PA-C  ?gabapentin (NEURONTIN) 300 MG capsule Take 1 capsule (300 mg total) by mouth at bedtime. 07/15/21  Yes Angiulli, Lavon Paganini, PA-C  ?IMBRUVICA 420 MG tablet TAKE 1 TABLET BY MOUTH ONCE  DAILY WITH A FULL GLASS OF WATER ?Patient taking differently: 420 mg daily. WITH A FULL GLASS OF  WATER 06/14/21  Yes Shadad, Mathis Dad, MD  ?melatonin 5 MG TABS Take 1 tablet (5 mg total) by mouth at bedtime. 07/15/21  Yes Angiulli, Lavon Paganini, PA-C  ?methocarbamol (ROBAXIN) 500 MG tablet Take 1 tablet (500 mg total) by mouth 3 (three) times daily. ?Patient taking differently: Take 500 mg by mouth every 8 (eight) hours as needed for muscle spasms. 07/15/21  Yes Angiulli, Lavon Paganini, PA-C  ?oxyCODONE (OXY IR/ROXICODONE) 5 MG immediate release tablet Take 1 tablet (5 mg total) by mouth every 4 (four) hours as needed for moderate  pain. 07/15/21  Yes Angiulli, Lavon Paganini, PA-C  ?oxymetazoline (AFRIN) 0.05 % nasal spray Place 1 spray into both nostrils 2 (two) times daily as needed for congestion.   Yes [provider]  ?terazosin (HYTRIN) 5 MG capsule Take 3 capsules (15 mg total) by mouth at bedtime. ?Patient taking differently: Take 15 mg by mouth in the morning. 07/15/21  Yes Angiulli, Lavon Paganini, PA-C  ?valsartan-hydrochlorothiazide (DIOVAN-HCT) 320-25 MG tablet Take 1 tablet by mouth daily.   Yes [provider]  ?aspirin EC 81 MG EC tablet Take 1 tablet (81 mg total) by mouth daily. Swallow whole. ?Patient not taking: Reported on 08/03/2021 07/02/21   Little Ishikawa, MD  ?irbesartan (AVAPRO) 300 MG tablet Take 1 tablet (300 mg total) by mouth daily. ?Patient not taking: Reported on  08/03/2021 07/15/21   Cathlyn Parsons, PA-C  ?metoprolol tartrate (LOPRESSOR) 25 MG tablet Take 1 tablet (25 mg total) by mouth every 8 (eight) hours. ?Patient not taking: Reported on 08/03/2021 07/15/21   A

## 2021-08-05 NOTE — Progress Notes (Signed)
Patient brought home cancer med. RN counted and delivered to pharmacy. MD paged to reorder home medication.  ?[place] C Coleby Yett  ?

## 2021-08-05 NOTE — Progress Notes (Signed)
Remote pacemaker transmission.   

## 2021-08-05 NOTE — Progress Notes (Addendum)
?  Progress Note ? ? ? ?08/05/2021 ?8:21 AM ?1 Day Post-Op ? ?Subjective:  no major complaints ? ? ?Vitals:  ? 08/05/21 0322 08/05/21 0500  ?BP: (!) 112/59 102/60  ?Pulse: 83 84  ?Resp: 18 20  ?Temp: 97.7 ?F (36.5 ?C)   ?SpO2: 100% 100%  ? ?Physical Exam: ?Cardiac: regular ?Lungs:  non labored ?Incisions:  right AKA stump well appearing, staples intact. Superficial anterior incisional dehiscence ?Extremities:  LLE well perfused and warm with palpable left PT ?Neurologic: alert and oriented ? ?CBC ?   ?Component Value Date/Time  ? WBC 5.8 08/05/2021 0703  ? RBC 3.42 (L) 08/05/2021 0703  ? HGB 9.5 (L) 08/05/2021 0703  ? HGB 14.9 05/04/2021 0752  ? HCT 30.2 (L) 08/05/2021 0703  ? PLT 367 08/05/2021 0703  ? PLT 352 05/04/2021 0752  ? MCV 88.3 08/05/2021 0703  ? MCH 27.8 08/05/2021 0703  ? MCHC 31.5 08/05/2021 0703  ? RDW 17.1 (H) 08/05/2021 0703  ? LYMPHSABS 2.6 08/03/2021 1911  ? MONOABS 0.4 08/03/2021 1911  ? EOSABS 0.0 08/03/2021 1911  ? BASOSABS 0.1 08/03/2021 1911  ? ? ?BMET ?   ?Component Value Date/Time  ? NA 127 (L) 08/04/2021 0327  ? K 3.6 08/04/2021 0327  ? CL 98 08/04/2021 0327  ? CO2 20 (L) 08/04/2021 0327  ? GLUCOSE 156 (H) 08/04/2021 0327  ? BUN 32 (H) 08/04/2021 0327  ? CREATININE 0.80 08/04/2021 0327  ? CREATININE 0.94 05/04/2021 0752  ? CALCIUM 7.6 (L) 08/04/2021 0327  ? GFRNONAA >60 08/04/2021 0327  ? GFRNONAA >60 05/04/2021 0752  ? GFRAA >60 01/08/2020 0810  ? ? ?INR ?   ?Component Value Date/Time  ? INR 1.1 08/04/2021 0327  ? ? ? ?Intake/Output Summary (Last 24 hours) at 08/05/2021 4742 ?Last data filed at 08/05/2021 5956 ?Gross per 24 hour  ?Intake 2838.42 ml  ?Output 700 ml  ?Net 2138.42 ml  ? ? ? ?Assessment/Plan:  71 y.o. male is s/p left lower extremity Angiogram with Left SFA stenting 1 Day Post-Op  ? ?Left lower extremity well perfused and warm with palpable PT  ?Right common femoral access site c/d/I without swelling or hematoma ?Right AKA healing well ?Local wound care to superficial skin wound  right thigh ?Extensive LLE DVT on Heparin IV ?Continue Asa and Plavix ?Some overnight hypotension ?Hold home BB ?Presently stable post fluid bolus ?PT/ OT to eval ? ? ?[name], PA-C ?[facility] ?[phone] ?08/05/2021 ?8:21 AM ? ?I have independently interviewed and examined patient and agree with PA assessment and plan above. L sfa revascularization now with worsened edema. Low BP this a.m. but O2 sats normal and HR stable with low suspicion of PE. With extensive dvt will plan for venous thrombectomy tomorrow in pv lab to prevent future post thrombotic syndrome in his remaining leg. NPO past midnight.  ? ?[name], MD ?[facility] ?Office: [phone] ?Pager: [phone] ? ?

## 2021-08-05 NOTE — Progress Notes (Signed)
Pt's BP dropped to 84/50 again. DR. Trula Slade paged, MD called back. Notified of pt's BP, also notified that earlier it was in 80's too, came up with being in trendelenburg position for about 10 min, and has NS running at 99.87m/hr, pt is non symptomatic. Received order for 1000cc NS bolus x1 and BMP lab. See MAR for medication administration. Will continue to monitor.  ?

## 2021-08-05 NOTE — Evaluation (Signed)
Occupational Therapy Evaluation ?Patient Details ?Name: Douglas [name] ?MRN: [id number] ?DOB: 02/01/1951 ?Today's Date: 08/05/2021 ? ? ?History of Present Illness 71 y.o. male who presented 08/03/21 with worsening L foot pain and edema. Pt with age indeterminate bil lower extremity DVTs. S/p abdominal aortogram with L superficial femoral artery stent 4/26. Hospital stay complicated by hypotension. 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 PMH: pacemaker, DM, BPH, HTN, and CLL.  ? ?Clinical Impression ?  ?PTA, pt was living with his wife who assisted with bathing, dressing, and toileting at bed level. Pt was working on sitting balance at EOB with HHOT/PT. Pt currently limited by fatigue and soft BP. Max A for repositioning in bed. Pt motivated to participate in bed level exercises. Pt performing LLE ROM, RLE ROM, bridges, and long sitting pull up with Min A. BP supine 102/59 and upright with HOB at 45* 83/50; not symptomatic and RN notified.  Pt would benefit from further acute OT to facilitate safe dc. Recommend dc to home with HHOT for further OT to optimize safety, independence with ADLs, and return to PLOF.  ?   ? ?Recommendations for follow up therapy are one component of a multi-disciplinary discharge planning process, led by the attending physician.  Recommendations may be updated based on patient status, additional functional criteria and insurance authorization.  ? ?Follow Up Recommendations ? Home health OT  ?  ?Assistance Recommended at Discharge Frequent or constant Supervision/Assistance  ?Patient can return home with the following A lot of help with bathing/dressing/bathroom;Assistance with cooking/housework;Assistance with feeding;Direct supervision/assist for medications management;Direct supervision/assist for financial management;Assist for transportation ? ?  ?Functional Status Assessment ? Patient has had a recent decline in their  functional status and demonstrates the ability to make significant improvements in function in a reasonable and predictable amount of time.  ?Equipment Recommendations ? None recommended by OT  ?  ?Recommendations for Other Services PT consult ? ? ?  ?Precautions / Restrictions Precautions ?Precautions: Fall ?Precaution Comments: R AKA 07/19/21; watch BP (low) ?Restrictions ?Weight Bearing Restrictions: Yes ?RLE Weight Bearing: Non weight bearing  ? ?  ? ?Mobility Bed Mobility ?Overal bed mobility: Needs Assistance ?  ?  ?  ?  ?  ?  ?General bed mobility comments: TAx2 to transition pt superiorly in bed using bed pads. Pt able to lift back off HOB through pulling on bil bed rails with HOB at 45 degree angle without physical assistance. ?  ? ?Transfers ?  ?  ?  ?  ?  ?  ?  ?  ?  ?General transfer comment: pt politely declined sitting EOB or OOB mobility today ?  ? ?  ?Balance Overall balance assessment: Needs assistance ?Sitting-balance support: Feet unsupported, Bilateral upper extremity supported ?Sitting balance-Leahy Scale: Poor ?Sitting balance - Comments: Pt able to lift trunk off elevated HOB pulling on bil bed rails. ?  ?  ?  ?Standing balance comment: unable ?  ?  ?  ?  ?  ?  ?  ?  ?  ?  ?  ?   ? ?ADL either performed or assessed with clinical judgement  ? ?ADL Overall ADL's : Needs assistance/impaired ?Eating/Feeding: Set up;Bed level ?  ?Grooming: Set up;Bed level ?  ?Upper Body Bathing: Minimal assistance;Bed level ?  ?Lower Body Bathing: Maximal assistance;Bed level ?  ?Upper Body Dressing : Minimal assistance;Bed level ?  ?Lower Body Dressing: Maximal assistance;Bed  level ?  ?  ?  ?  ?  ?  ?  ?  ?   ? ? ? ?Vision Baseline Vision/History: 1 Wears glasses ?   ?   ?Perception   ?  ?Praxis   ?  ? ?Pertinent Vitals/Pain Pain Assessment ?Pain Assessment: Faces ?Faces Pain Scale: Hurts a little bit ?Pain Location: grimacing with end AROM of shoulders ?Pain Descriptors / Indicators: Grimacing ?Pain  Intervention(s): Monitored during session, Limited activity within patient's tolerance, Repositioned  ? ? ? ?Hand Dominance Right ?  ?Extremity/Trunk Assessment Upper Extremity Assessment ?Upper Extremity Assessment: Overall WFL for tasks assessed ?  ?Lower Extremity Assessment ?Lower Extremity Assessment: Defer to PT evaluation ?RLE Deficits / Details: s/p R AKA 07/19/21 with pt reporting intact sensation to light touch; staples noted at incision site for AKA with nice cone shaping of residual limb; likely limited hip extension ROM ?LLE Deficits / Details: Decreased sensation to touch but detects touch from the knee down to the foot; noted erythema at L heel; edema noted; generalized weakness throughout ?LLE Sensation: decreased light touch ?  ?Cervical / Trunk Assessment ?Cervical / Trunk Assessment: Normal ?  ?Communication Communication ?Communication: No difficulties ?  ?Cognition Arousal/Alertness: Awake/alert ?Behavior During Therapy: Anxious, WFL for tasks assessed/performed ?Overall Cognitive Status: Within Functional Limits for tasks assessed ?  ?  ?  ?  ?  ?  ?  ?  ?  ?  ?  ?  ?  ?  ?  ?  ?General Comments: Anxious in regards to mobility with recent low BP episode, needing encouragement to begin mobility. ?  ?  ?General Comments  BP: Supine 102/59, 83/50 with HOB elevated to 45 degrees. (RN aware and monitoring pt) ? ?  ?Exercises Exercises: General Lower Extremity, General Upper Extremity, Other exercises ?General Exercises - Upper Extremity ?Shoulder Flexion: AROM, Strengthening, Both, 10 reps, Supine ?General Exercises - Lower Extremity ?Ankle Circles/Pumps: AROM, Left, 5 reps, Supine ?Heel Slides: AAROM, Left, 10 reps, Supine ?Hip ABduction/ADduction: AAROM, Left, 10 reps, Supine ?Other Exercises ?Other Exercises: L hip internal and external rotation 10x supine ?Other Exercises: Bridges on L leg 5x supine ?Other Exercises: modified sit-ups with pt pulling trunk off HOB with bil hands on bed rails, 5x,  HOB 45 degrees ?  ?Shoulder Instructions    ? ? ?Home Living Family/patient expects to be discharged to:: Private residence ?Living Arrangements: Spouse/significant other ?Available Help at Discharge: Family;Available 24 hours/day ?Type of Home: House ?Home Access: Stairs to enter ?Entrance Stairs-Number of Steps: 3 ?Entrance Stairs-Rails: Left ?Home Layout: 1/2 bath on main level;Bed/bath upstairs;Two level ?Alternate Level Stairs-Number of Steps: 15 ?Alternate Level Stairs-Rails: Can reach both ?Bathroom Shower/Tub: Other (comment) ?  ?Bathroom Toilet: Standard ?Bathroom Accessibility: Yes ?  ?Home Equipment: Hospital bed;Wheelchair - manual;BSC/3in1;Other (comment) (rents hospital bed; sliding board; bedside commode with drop arms) ?  ?  ?  ? ?  ?Prior Functioning/Environment Prior Level of Function : Needs assist ?  ?  ?  ?Physical Assist : Mobility (physical);ADLs (physical) ?Mobility (physical): Bed mobility;Transfers ?ADLs (physical): Bathing;Dressing;Toileting ?Mobility Comments: Pt was in AIR 07/01/21 - 07/19/21 s/p R BKA 06/25/21 and was working on sliding board transfers. Once home 07/23/21 s/p R AKA 07/19/21, pt was working on sitting balance EOB with HHPT. At home, needed assistance with bed mobility, even rolling due to weakness in core. Could sit EOB 30-60 min at a time. Pt had not performed sliding board transfer with wife since d/c home. ?ADLs Comments: Wife  assisted with bathing and dressing in hospital bed. Pt using bed pan or depends. Called emergency services for transport in and out of home. ?  ? ?  ?  ?OT Problem List: Decreased strength;Decreased range of motion;Decreased activity tolerance;Impaired balance (sitting and/or standing);Decreased knowledge of use of DME or AE;Decreased knowledge of precautions;Pain ?  ?   ?OT Treatment/Interventions: Self-care/ADL training;Therapeutic exercise;Energy conservation;DME and/or AE instruction;Manual therapy;Therapeutic activities;Patient/family  education;Balance training;Modalities  ?  ?OT Goals(Current goals can be found in the care plan section) Acute Rehab OT Goals ?Patient Stated Goal: Return home ?OT Goal Formulation: With patient ?Time For Goal Achievement: 05

## 2021-08-05 NOTE — Progress Notes (Signed)
PHARMACIST LIPID MONITORING ? ? ?Douglas [name] is a 71 y.o. male admitted on 08/03/2021 with ischemic leg. Pharmacy has been consulted to optimize lipid-lowering therapy with the indication of secondary prevention for clinical ASCVD. ? ?Recent Labs: ? ?Lipid Panel (last 6 months):   ?Lab Results  ?Component Value Date  ? CHOL 150 06/22/2021  ? TRIG 83 06/22/2021  ? HDL 42 06/22/2021  ? CHOLHDL 3.6 06/22/2021  ? VLDL 17 06/22/2021  ? [place] 91 06/22/2021  ? ? ?Hepatic function panel (last 6 months):   ?Lab Results  ?Component Value Date  ? AST 28 07/14/2021  ? ALT 89 (H) 07/14/2021  ? ALKPHOS 214 (H) 07/14/2021  ? BILITOT 0.4 07/14/2021  ? ? ?SCr (since admission):   ?Serum creatinine: 0.8 mg/dL 08/04/21 0327 ?Estimated creatinine clearance: 91.6 mL/min ? ?Current therapy and lipid therapy tolerance ?Current lipid-lowering therapy: none - not taking crestor '20mg'$  daily PTA ?Previous lipid-lowering therapies (if applicable): n/a ?Documented or reported allergies or intolerances to lipid-lowering therapies (if applicable): none ? ?Assessment:   ?Patient agrees with changes to lipid-lowering therapy >> Pt was not taking rosuvastatin '20mg'$  PTA (reports it was not prescribed) - will resume with new script ? ?Plan:   ? ?1.Statin intensity (high intensity recommended for all patients regardless of the LDL):  Add or increase statin to high intensity. ? ?2.Add ezetimibe (if any one of the following):   Not indicated at this time. ? ?3.Refer to lipid clinic:   No ? ?4.Follow-up with:  Primary care provider - [name], MD ? ?5.Follow-up labs after discharge:  Changes in lipid therapy were made. Check a lipid panel in 8-12 weeks then annually.    ? ? ? ?Einar Grad, PharmD ?08/05/2021, 7:53 AM ? ?

## 2021-08-05 NOTE — Progress Notes (Signed)
ANTICOAGULATION CONSULT NOTE - Follow Up Consult ? ?Pharmacy Consult for heparin ?Indication:  ischemic limb ? ?Labs: ?Recent Labs  ?  08/03/21 ?1911 08/03/21 ?1919 08/04/21 ?0327 08/04/21 ?0330 08/04/21 ?2246  ?HGB 10.9* 12.2* 9.9*  --   --   ?HCT 34.4* 36.0* 30.7*  --   --   ?PLT 361  --  350  --   --   ?LABPROT 12.9  --  14.0  --   --   ?INR 1.0  --  1.1  --   --   ?HEPARINUNFRC  --   --   --  0.30 0.18*  ?CREATININE 0.82 0.80 0.80  --   --   ? ? ?Assessment: ?71yo male subtherapeutic on heparin after resuming post-op; no infusion issues or signs of bleeding per RN. ? ?Goal of Therapy:  ?Heparin level 0.3-0.7 units/ml ?  ?Plan:  ?Will increase heparin infusion by 2 units/kg/hr to 1800 units/hr and check level in 6 hours.   ? ?Wynona Neat, PharmD, BCPS  ?08/05/2021,12:16 AM ? ? ?

## 2021-08-05 NOTE — Progress Notes (Signed)
ANTICOAGULATION CONSULT NOTE ? ?Pharmacy Consult for heparin ?Indication:  acute limb ischemia ? ?No Known Allergies ? ?Patient Measurements: ?Height: 6' (182.9 cm) ?Weight: 90.3 kg (199 lb 1.2 oz) ?IBW/kg (Calculated) : 77.6 ?Heparin Dosing Weight: 97 kg  ? ?Vital Signs: ?Temp: 97.7 ?F (36.5 ?C) (04/27 0322) ?Temp Source: Oral (04/27 0322) ?BP: 102/60 (04/27 0500) ?Pulse Rate: 84 (04/27 0500) ? ?Labs: ?Recent Labs  ?  08/03/21 ?1911 08/03/21 ?1919 08/04/21 ?0327 08/04/21 ?0330 08/04/21 ?2246 08/05/21 ?0703  ?HGB 10.9* 12.2* 9.9*  --   --  9.5*  ?HCT 34.4* 36.0* 30.7*  --   --  30.2*  ?PLT 361  --  350  --   --  367  ?LABPROT 12.9  --  14.0  --   --   --   ?INR 1.0  --  1.1  --   --   --   ?HEPARINUNFRC  --   --   --  0.30 0.18* 0.30  ?CREATININE 0.82 0.80 0.80  --   --   --   ? ? ? ?Estimated Creatinine Clearance: 91.6 mL/min (by C-G formula based on SCr of 0.8 mg/dL). ? ? ?Medical History: ?Past Medical History:  ?Diagnosis Date  ? BPH (benign prostatic hyperplasia)   ? CLL (chronic lymphocytic leukemia) ([place])   ? Hypertension   ? Pacemaker   ? Medtronic Device  ? ? ?Medications:  ?Medications Prior to Admission  ?Medication Sig Dispense Refill Last Dose  ? albuterol (PROVENTIL) (2.5 MG/3ML) 0.083% nebulizer solution Take 2.5 mg by nebulization every 6 (six) hours as needed for wheezing or shortness of breath.   08/03/2021  ? albuterol (VENTOLIN HFA) 108 (90 Base) MCG/ACT inhaler Inhale 2 puffs into the lungs 4 (four) times daily. 8.5 g 0 unknown  ? amLODipine (NORVASC) 10 MG tablet Take 1 tablet (10 mg total) by mouth daily. 30 tablet 0 08/02/2021  ? ascorbic acid (VITAMIN C) 1000 MG tablet Take 1 tablet (1,000 mg total) by mouth daily. 30 tablet 0 08/03/2021  ? docusate sodium (COLACE) 100 MG capsule Take 1 capsule (100 mg total) by mouth 2 (two) times daily. (Patient taking differently: Take 100 mg by mouth daily.) 10 capsule 0 08/02/2021  ? finasteride (PROSCAR) 5 MG tablet Take 1 tablet (5 mg total) by mouth  daily. 30 tablet 0 08/02/2021  ? gabapentin (NEURONTIN) 300 MG capsule Take 1 capsule (300 mg total) by mouth at bedtime. 30 capsule 0 08/02/2021  ? IMBRUVICA 420 MG tablet TAKE 1 TABLET BY MOUTH ONCE  DAILY WITH A FULL GLASS OF WATER (Patient taking differently: 420 mg daily. WITH A FULL GLASS OF  WATER) 28 tablet 1 08/03/2021 at 1200  ? melatonin 5 MG TABS Take 1 tablet (5 mg total) by mouth at bedtime. 30 tablet 0 08/02/2021  ? methocarbamol (ROBAXIN) 500 MG tablet Take 1 tablet (500 mg total) by mouth 3 (three) times daily. (Patient taking differently: Take 500 mg by mouth every 8 (eight) hours as needed for muscle spasms.) 90 tablet 0 Past Week  ? oxyCODONE (OXY IR/ROXICODONE) 5 MG immediate release tablet Take 1 tablet (5 mg total) by mouth every 4 (four) hours as needed for moderate pain. 30 tablet 0 Past Week  ? oxymetazoline (AFRIN) 0.05 % nasal spray Place 1 spray into both nostrils 2 (two) times daily as needed for congestion.   unknown  ? terazosin (HYTRIN) 5 MG capsule Take 3 capsules (15 mg total) by mouth at bedtime. (Patient taking differently:  Take 15 mg by mouth in the morning.) 90 capsule 0 08/03/2021  ? valsartan-hydrochlorothiazide (DIOVAN-HCT) 320-25 MG tablet Take 1 tablet by mouth daily.   08/03/2021  ? aspirin EC 81 MG EC tablet Take 1 tablet (81 mg total) by mouth daily. Swallow whole. (Patient not taking: Reported on 08/03/2021) 30 tablet 11 Completed Course  ? irbesartan (AVAPRO) 300 MG tablet Take 1 tablet (300 mg total) by mouth daily. (Patient not taking: Reported on 08/03/2021) 30 tablet 0 Completed Course  ? metoprolol tartrate (LOPRESSOR) 25 MG tablet Take 1 tablet (25 mg total) by mouth every 8 (eight) hours. (Patient not taking: Reported on 08/03/2021) 90 tablet 0 Completed Course  ? montelukast (SINGULAIR) 10 MG tablet Take 1 tablet (10 mg total) by mouth daily as needed (allergies). (Patient not taking: Reported on 08/03/2021) 10 tablet 0 Completed Course  ? Multiple Vitamin (MULTIVITAMIN  WITH MINERALS) TABS tablet Take 1 tablet by mouth daily. (Patient not taking: Reported on 08/03/2021)   Completed Course  ? pantoprazole (PROTONIX) 40 MG tablet Take 1 tablet (40 mg total) by mouth 2 (two) times daily. (Patient not taking: Reported on 08/03/2021) 60 tablet 0 Completed Course  ? polyethylene glycol (MIRALAX / GLYCOLAX) 17 g packet Take 17 g by mouth 2 (two) times daily. (Patient not taking: Reported on 08/03/2021) 14 each 0 Completed Course  ? rosuvastatin (CRESTOR) 20 MG tablet Take 1 tablet (20 mg total) by mouth daily. (Patient not taking: Reported on 08/03/2021) 30 tablet 0 Completed Course  ? ? 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 Pharmacy consulted to start IV heparin limb ischemia to the L foot.  ? ?Pt s/p cath lab for PV angio - heparin resumed postop. Heparin level this morning is therapeutic at 0.3 but at the low end of the range. CBC stable. ? ?Goal of Therapy:  ?Heparin level 0.3-0.7 units/ml ?Monitor platelets by anticoagulation protocol: Yes ?  ?Plan:  ?Increase heparin to 1900 units/h ?Daily heparin level and CBC ? ?Arrie Senate, PharmD, BCPS, BCCP ?Clinical Pharmacist ?[phone] ?Please check AMION for all [place] numbers ?08/05/2021 ? ? ? ?

## 2021-08-05 NOTE — TOC Benefit Eligibility Note (Signed)
Patient Advocate Encounter  Insurance verification completed.    The patient is currently admitted and upon discharge could be taking Eliquis 5 mg.  The current 30 day co-pay is, $0.00.   The patient is currently admitted and upon discharge could be taking Xarelto 20 mg.  The current 30 day co-pay is, $0.00.   The patient is insured through [employer] Medicare Part D     Douglas [name], CPhT Pharmacy Patient Advocate Specialist [facility] Patient Advocate Team Direct Number: [phone]  Fax: [phone]        

## 2021-08-05 NOTE — Plan of Care (Signed)

## 2021-08-06 ENCOUNTER — Inpatient Hospital Stay (HOSPITAL_COMMUNITY)
Admission: EM | Disposition: A | Discharge status: Home Health | Payer: Self-pay | Source: Home / Self Care | Attending: Vascular Surgery

## 2021-08-06 DIAGNOSIS — I471 Supraventricular tachycardia: Secondary | ICD-10-CM | POA: Diagnosis not present

## 2021-08-06 DIAGNOSIS — I82492 Acute embolism and thrombosis of other specified deep vein of left lower extremity: Secondary | ICD-10-CM | POA: Diagnosis not present

## 2021-08-06 DIAGNOSIS — I959 Hypotension, unspecified: Secondary | ICD-10-CM | POA: Diagnosis not present

## 2021-08-06 DIAGNOSIS — I739 Peripheral vascular disease, unspecified: Secondary | ICD-10-CM | POA: Diagnosis not present

## 2021-08-06 DIAGNOSIS — I82412 Acute embolism and thrombosis of left femoral vein: Secondary | ICD-10-CM

## 2021-08-06 HISTORY — PX: LOWER EXTREMITY VENOGRAPHY: CATH118253

## 2021-08-06 HISTORY — PX: PERIPHERAL VASCULAR THROMBECTOMY: CATH118306

## 2021-08-06 LAB — CBC
HCT: 27.9 % — ABNORMAL LOW (ref 39.0–52.0)
HCT: 25 % — ABNORMAL LOW (ref 39.0–52.0)
Hemoglobin: 9 g/dL — ABNORMAL LOW (ref 13.0–17.0)
Hemoglobin: 8 g/dL — ABNORMAL LOW (ref 13.0–17.0)
MCH: 28.4 pg (ref 26.0–34.0)
MCH: 28.4 pg (ref 26.0–34.0)
MCHC: 32.3 g/dL (ref 30.0–36.0)
MCHC: 32 g/dL (ref 30.0–36.0)
MCV: 88 fL (ref 80.0–100.0)
MCV: 88.7 fL (ref 80.0–100.0)
Platelets: 398 10*3/uL (ref 150–400)
Platelets: 363 10*3/uL (ref 150–400)
RBC: 3.17 MIL/uL — ABNORMAL LOW (ref 4.22–5.81)
RBC: 2.82 MIL/uL — ABNORMAL LOW (ref 4.22–5.81)
RDW: 16.9 % — ABNORMAL HIGH (ref 11.5–15.5)
RDW: 17 % — ABNORMAL HIGH (ref 11.5–15.5)
WBC: 6 10*3/uL (ref 4.0–10.5)
WBC: 4.6 10*3/uL (ref 4.0–10.5)
nRBC: 0 % (ref 0.0–0.2)
nRBC: 0 % (ref 0.0–0.2)

## 2021-08-06 LAB — BASIC METABOLIC PANEL
Anion gap: 7 (ref 5–15)
Anion gap: 5 (ref 5–15)
BUN: 31 mg/dL — ABNORMAL HIGH (ref 8–23)
BUN: 22 mg/dL (ref 8–23)
CO2: 20 mmol/L — ABNORMAL LOW (ref 22–32)
CO2: 19 mmol/L — ABNORMAL LOW (ref 22–32)
Calcium: 8.3 mg/dL — ABNORMAL LOW (ref 8.9–10.3)
Calcium: 7.9 mg/dL — ABNORMAL LOW (ref 8.9–10.3)
Chloride: 101 mmol/L (ref 98–111)
Chloride: 104 mmol/L (ref 98–111)
Creatinine, Ser: 0.59 mg/dL — ABNORMAL LOW (ref 0.61–1.24)
Creatinine, Ser: 0.54 mg/dL — ABNORMAL LOW (ref 0.61–1.24)
GFR, Estimated: >60 mL/min (ref >60)
GFR, Estimated: >60 mL/min (ref >60)
Glucose, Bld: 170 mg/dL — ABNORMAL HIGH (ref 70–99)
Glucose, Bld: 198 mg/dL — ABNORMAL HIGH (ref 70–99)
Potassium: 4 mmol/L (ref 3.5–5.1)
Potassium: 4.2 mmol/L (ref 3.5–5.1)
Sodium: 128 mmol/L — ABNORMAL LOW (ref 135–145)
Sodium: 128 mmol/L — ABNORMAL LOW (ref 135–145)

## 2021-08-06 LAB — CBC WITH DIFFERENTIAL/PLATELET
Abs Immature Granulocytes: 0 10*3/uL (ref 0.00–0.07)
Basophils Absolute: 0.1 10*3/uL (ref 0.0–0.1)
Basophils Relative: 2 %
Eosinophils Absolute: 0 10*3/uL (ref 0.0–0.5)
Eosinophils Relative: 0 %
HCT: 26.1 % — ABNORMAL LOW (ref 39.0–52.0)
Hemoglobin: 8.3 g/dL — ABNORMAL LOW (ref 13.0–17.0)
Lymphocytes Relative: 8 %
Lymphs Abs: 0.4 10*3/uL — ABNORMAL LOW (ref 0.7–4.0)
MCH: 28.6 pg (ref 26.0–34.0)
MCHC: 31.8 g/dL (ref 30.0–36.0)
MCV: 90 fL (ref 80.0–100.0)
Monocytes Absolute: 0 10*3/uL — ABNORMAL LOW (ref 0.1–1.0)
Monocytes Relative: 1 %
Neutro Abs: 4.4 10*3/uL (ref 1.7–7.7)
Neutrophils Relative %: 89 %
Platelets: 357 10*3/uL (ref 150–400)
RBC: 2.9 MIL/uL — ABNORMAL LOW (ref 4.22–5.81)
RDW: 17.1 % — ABNORMAL HIGH (ref 11.5–15.5)
WBC: 4.9 10*3/uL (ref 4.0–10.5)
nRBC: 0 % (ref 0.0–0.2)
nRBC: 0 /100 WBC

## 2021-08-06 LAB — HEPARIN LEVEL (UNFRACTIONATED)
Heparin Unfractionated: 0.27 IU/mL — ABNORMAL LOW (ref 0.30–0.70)
Heparin Unfractionated: 0.62 IU/mL (ref 0.30–0.70)

## 2021-08-06 LAB — MAGNESIUM
Magnesium: 1.6 mg/dL — ABNORMAL LOW (ref 1.7–2.4)
Magnesium: 1.7 mg/dL (ref 1.7–2.4)

## 2021-08-06 LAB — CORTISOL: Cortisol, Plasma: 13.3 ug/dL

## 2021-08-06 LAB — SURGICAL PCR SCREEN
MRSA, PCR: NEGATIVE
Staphylococcus aureus: NEGATIVE

## 2021-08-06 LAB — LIPID PANEL
Cholesterol: 93 mg/dL (ref 0–200)
HDL: 40 mg/dL — ABNORMAL LOW (ref >40)
LDL Cholesterol: 40 mg/dL (ref 0–99)
Total CHOL/HDL Ratio: 2.3 RATIO
Triglycerides: 64 mg/dL (ref <150)
VLDL: 13 mg/dL (ref 0–40)

## 2021-08-06 LAB — TROPONIN I (HIGH SENSITIVITY): Troponin I (High Sensitivity): 80 ng/L — ABNORMAL HIGH (ref <18)

## 2021-08-06 SURGERY — LOWER EXTREMITY VENOGRAPHY
Anesthesia: LOCAL | Laterality: Left

## 2021-08-06 MED ORDER — FENTANYL CITRATE (PF) 100 MCG/2ML IJ SOLN
INTRAMUSCULAR | Status: DC | PRN
  Administered 2021-08-06: 25 ug via INTRAVENOUS
  Administered 2021-08-06: 50 ug via INTRAVENOUS

## 2021-08-06 MED ORDER — AMIODARONE HCL IN DEXTROSE 360-4.14 MG/200ML-% IV SOLN
INTRAVENOUS | Status: AC
  Filled 2021-08-06: qty 200

## 2021-08-06 MED ORDER — LIDOCAINE HCL (PF) 1 % IJ SOLN
INTRAMUSCULAR | Status: DC | PRN
  Administered 2021-08-06: 5 mL

## 2021-08-06 MED ORDER — AMIODARONE HCL IN DEXTROSE 360-4.14 MG/200ML-% IV SOLN
30.0000 mg/h | INTRAVENOUS | Status: DC
  Administered 2021-08-07 (×2): 30 mg/h via INTRAVENOUS
  Filled 2021-08-06 (×4): qty 200

## 2021-08-06 MED ORDER — MIDAZOLAM HCL 2 MG/2ML IJ SOLN
INTRAMUSCULAR | Status: AC
  Filled 2021-08-06: qty 2

## 2021-08-06 MED ORDER — HYDRALAZINE HCL 20 MG/ML IJ SOLN: 5.0000 mg | INTRAMUSCULAR | Status: DC | PRN

## 2021-08-06 MED ORDER — AMIODARONE LOAD VIA INFUSION
150.0000 mg | Freq: Once | INTRAVENOUS | Status: AC
  Administered 2021-08-06: 150 mg via INTRAVENOUS
  Filled 2021-08-06: qty 83.34

## 2021-08-06 MED ORDER — LIDOCAINE HCL (PF) 1 % IJ SOLN
INTRAMUSCULAR | Status: AC
  Filled 2021-08-06: qty 30

## 2021-08-06 MED ORDER — ALBUTEROL SULFATE (2.5 MG/3ML) 0.083% IN NEBU
2.5000 mg | INHALATION_SOLUTION | Freq: Two times a day (BID) | RESPIRATORY_TRACT | Status: DC
  Administered 2021-08-06: 2.5 mg via RESPIRATORY_TRACT
  Filled 2021-08-06: qty 3

## 2021-08-06 MED ORDER — LABETALOL HCL 5 MG/ML IV SOLN: 10.0000 mg | INTRAVENOUS | Status: DC | PRN

## 2021-08-06 MED ORDER — AMIODARONE HCL IN DEXTROSE 360-4.14 MG/200ML-% IV SOLN
60.0000 mg/h | INTRAVENOUS | Status: DC
  Administered 2021-08-06: 60 mg/h via INTRAVENOUS

## 2021-08-06 MED ORDER — HEPARIN SODIUM (PORCINE) 1000 UNIT/ML IJ SOLN
INTRAMUSCULAR | Status: AC
Start: 2021-08-06 — End: ?
  Filled 2021-08-06: qty 10

## 2021-08-06 MED ORDER — LACTATED RINGERS IV SOLN: INTRAVENOUS | Status: DC

## 2021-08-06 MED ORDER — SODIUM CHLORIDE 0.9 % IV SOLN: 250.0000 mL | INTRAVENOUS | Status: DC | PRN

## 2021-08-06 MED ORDER — LACTATED RINGERS IV BOLUS
1000.0000 mL | Freq: Once | INTRAVENOUS | Status: AC
  Administered 2021-08-06: 1000 mL via INTRAVENOUS

## 2021-08-06 MED ORDER — HEPARIN (PORCINE) IN NACL 1000-0.9 UT/500ML-% IV SOLN
INTRAVENOUS | Status: DC | PRN
  Administered 2021-08-06 (×2): 500 mL

## 2021-08-06 MED ORDER — ONDANSETRON HCL 4 MG/2ML IJ SOLN
4.0000 mg | Freq: Four times a day (QID) | INTRAMUSCULAR | Status: DC | PRN
  Administered 2021-08-07: 4 mg via INTRAVENOUS
  Filled 2021-08-06: qty 2

## 2021-08-06 MED ORDER — MEDIHONEY WOUND/BURN DRESSING EX PSTE
1.0000 | PASTE | Freq: Every day | CUTANEOUS | Status: DC
Start: 2021-08-06 — End: 2021-08-10
  Administered 2021-08-06 – 2021-08-10 (×5): 1 via TOPICAL
  Filled 2021-08-06: qty 44

## 2021-08-06 MED ORDER — LEVALBUTEROL HCL 0.63 MG/3ML IN NEBU
0.6300 mg | INHALATION_SOLUTION | Freq: Two times a day (BID) | RESPIRATORY_TRACT | Status: DC
  Administered 2021-08-07 – 2021-08-09 (×5): 0.63 mg via RESPIRATORY_TRACT
  Filled 2021-08-06 (×8): qty 3

## 2021-08-06 MED ORDER — MIDAZOLAM HCL 2 MG/2ML IJ SOLN
INTRAMUSCULAR | Status: DC | PRN
  Administered 2021-08-06: 1 mg via INTRAVENOUS

## 2021-08-06 MED ORDER — ACETAMINOPHEN 325 MG PO TABS
650.0000 mg | ORAL_TABLET | ORAL | Status: DC | PRN
  Administered 2021-08-08 – 2021-08-09 (×2): 650 mg via ORAL
  Filled 2021-08-06 (×2): qty 2

## 2021-08-06 MED ORDER — SODIUM CHLORIDE 0.9% FLUSH: 3.0000 mL | INTRAVENOUS | Status: DC | PRN

## 2021-08-06 MED ORDER — HEPARIN (PORCINE) IN NACL 1000-0.9 UT/500ML-% IV SOLN
INTRAVENOUS | Status: AC
  Filled 2021-08-06: qty 1000

## 2021-08-06 MED ORDER — SODIUM CHLORIDE 0.9 % WEIGHT BASED INFUSION
1.0000 mL/kg/h | INTRAVENOUS | Status: AC
  Administered 2021-08-06: 1 mL/kg/h via INTRAVENOUS

## 2021-08-06 MED ORDER — IODIXANOL 320 MG/ML IV SOLN
INTRAVENOUS | Status: DC | PRN
  Administered 2021-08-06: 35 mL

## 2021-08-06 MED ORDER — SODIUM CHLORIDE 0.9% FLUSH
3.0000 mL | Freq: Two times a day (BID) | INTRAVENOUS | Status: DC
  Administered 2021-08-07 – 2021-08-10 (×7): 3 mL via INTRAVENOUS

## 2021-08-06 MED ORDER — FENTANYL CITRATE (PF) 100 MCG/2ML IJ SOLN
INTRAMUSCULAR | Status: AC
  Filled 2021-08-06: qty 2

## 2021-08-06 SURGICAL SUPPLY — 16 items
BAG SNAP BAND KOVER 36X36 (MISCELLANEOUS) ×1 IMPLANT
CATH ANGIO 5F BER2 100CM (CATHETERS) ×1 IMPLANT
CATH CLOT TRIEVER BOLD (CATHETERS) ×1 IMPLANT
CATH VISIONS PV .035 IVUS (CATHETERS) ×1 IMPLANT
GLIDEWIRE ADV .035X260CM (WIRE) ×1 IMPLANT
KIT MICROPUNCTURE NIT STIFF (SHEATH) ×1 IMPLANT
KIT PV (KITS) ×3 IMPLANT
PROTECTION STATION PRESSURIZED (MISCELLANEOUS) ×3
SHEATH CLOT RETRIEVER (SHEATH) ×1 IMPLANT
SHEATH PINNACLE 5F 10CM (SHEATH) ×1 IMPLANT
SHEATH PINNACLE 8F 10CM (SHEATH) ×1 IMPLANT
SHEATH PROBE COVER 6X72 (BAG) ×1 IMPLANT
STATION PROTECTION PRESSURIZED (MISCELLANEOUS) IMPLANT
SYR MEDRAD MARK 7 150ML (SYRINGE) ×3 IMPLANT
TRANSDUCER W/STOPCOCK (MISCELLANEOUS) ×3 IMPLANT
TRAY PV CATH (CUSTOM PROCEDURE TRAY) ×3 IMPLANT

## 2021-08-06 NOTE — Progress Notes (Signed)
Pt back from cath lab. BP soft . MD aware  and came to Bedside new order for LR 1000 bolus. Will continue to monitor BP and pt ? ? ?Phoebe Sharps, RN ? ? 08/06/21 1700  ?Vitals  ?Temp 98 ?F (36.7 ?C)  ?Temp Source Oral  ?BP (!) 79/49  ?MAP (mmHg) (!) 58  ?BP Location Left Arm  ?BP Method Automatic  ?Patient Position (if appropriate) Lying  ?Pulse Rate 65  ?Pulse Rate Source Monitor  ?ECG Heart Rate 74  ?Resp 14  ?Level of Consciousness  ?Level of Consciousness Alert  ?Oxygen Therapy  ?SpO2 97 %  ?O2 Device Nasal Cannula  ?O2 Flow Rate (L/min) 2 L/min  ?MEWS Score  ?MEWS Temp 0  ?MEWS Systolic 2  ?MEWS Pulse 0  ?MEWS RR 0  ?MEWS LOC 0  ?MEWS Score 2  ?MEWS Score Color Yellow  ? ? ?

## 2021-08-06 NOTE — Consult Note (Signed)
[place] Nurse Consult Note: ?Reason for Consult: right thigh wound; non healing  ?Wound type: surgical 06/22/21; Right Fem-pop ?Pressure Injury POA: NA ?Measurement: 2.5cm x 1.0cm x 0.3cm  ?Wound bed:50% pink, pale, 50% yellow  ?Drainage (amount, consistency, odor) scant ?Periwound:intact  ?Dressing procedure/placement/frequency: ?Add medihoney to wound bed for non healing wound with yellow slough, apply daily. Teach patient to apply.  ? ?Discussed POC with patient and bedside nurse.  ?Re consult if needed, will not follow at this time. ?Thanks ? Garima Chronis [facility] MSN, RN,CWOCN, CNS, CWON-AP [phone])  ? ?  ?

## 2021-08-06 NOTE — Progress Notes (Addendum)
CCMD called stated pt in [place] done and MD called. New order for amio. protocol , rapid response at bedside. Code chart and pads on pt. ? ? ?Phoebe Sharps, RN ? ?

## 2021-08-06 NOTE — Progress Notes (Signed)
?  Transition of Care (TOC) Screening Note ? ? ?Patient Details  ?Name: [name] ?Date of Birth: March 27, 1951 ? ? ?Transition of Care (TOC) CM/SW Contact:    ?Dahlia Client, Romeo Rabon, RN ?Phone Number: ?08/06/2021, 1:54 PM ? ? ? ?Transition of Care Department [facility] [place]) has reviewed patient and note pt readmitted from home w/ wife. Pt has DME in the home including hospital bed, [facility], wheelchair and slide board. Pt was active with [facility] for HHRN/PT/OT needs. We will continue to monitor patient advancement through interdisciplinary progression rounds. If new patient transition needs arise, please place a TOC consult. ?  ?

## 2021-08-06 NOTE — Progress Notes (Signed)
?  Progress Note ? ? ? ?08/06/2021 ?2:19 PM ?2 Days Post-Op ? ?Subjective: Still having left lower extremity swelling and pain ? ?Vitals:  ? 08/06/21 0732 08/06/21 1045  ?BP: 105/61 (!) 116/55  ?Pulse: 100 90  ?Resp: 15 20  ?Temp: 98.1 ?F (36.7 ?C) 98.3 ?F (36.8 ?C)  ?SpO2: 96% 99%  ? ? ?Physical Exam: ?Awake alert oriented  ?nonlabored respirations ?Abdomen is soft ?Left lower extremity is edematous from thigh down with pitting edema below the knee ?1+ palpable left posterior tibial pulse ?Right above-knee amputation healing well ? ?CBC ?   ?Component Value Date/Time  ? WBC 6.0 08/06/2021 0220  ? RBC 3.17 (L) 08/06/2021 0220  ? HGB 9.0 (L) 08/06/2021 0220  ? HGB 14.9 05/04/2021 0752  ? HCT 27.9 (L) 08/06/2021 0220  ? PLT 398 08/06/2021 0220  ? PLT 352 05/04/2021 0752  ? MCV 88.0 08/06/2021 0220  ? MCH 28.4 08/06/2021 0220  ? MCHC 32.3 08/06/2021 0220  ? RDW 16.9 (H) 08/06/2021 0220  ? LYMPHSABS 2.6 08/03/2021 1911  ? MONOABS 0.4 08/03/2021 1911  ? EOSABS 0.0 08/03/2021 1911  ? BASOSABS 0.1 08/03/2021 1911  ? ? ?BMET ?   ?Component Value Date/Time  ? NA 128 (L) 08/06/2021 0220  ? K 4.0 08/06/2021 0220  ? CL 101 08/06/2021 0220  ? CO2 20 (L) 08/06/2021 0220  ? GLUCOSE 170 (H) 08/06/2021 0220  ? BUN 31 (H) 08/06/2021 0220  ? CREATININE 0.59 (L) 08/06/2021 0220  ? CREATININE 0.94 05/04/2021 0752  ? CALCIUM 8.3 (L) 08/06/2021 0220  ? GFRNONAA >60 08/06/2021 0220  ? GFRNONAA >60 05/04/2021 0752  ? GFRAA >60 01/08/2020 0810  ? ? ?INR ?   ?Component Value Date/Time  ? INR 1.1 08/04/2021 0327  ? ? ? ?Intake/Output Summary (Last 24 hours) at 08/06/2021 1419 ?Last data filed at 08/06/2021 1048 ?Gross per 24 hour  ?Intake 864 ml  ?Output 2005 ml  ?Net -1141 ml  ? ? ? ?Assessment/plan:  71 y.o. male is s/p right above-knee amputation which is healing well.  Left SFA stent is patent with palpable posterior tibial pulse.  Plan for venous thrombectomy today in the Cath Lab. ? ? ?Tadeo Besecker C. Donzetta Matters, MD ?[facility] ?Office: [phone] ?Pager: [phone] ? ?08/06/2021 ?2:19 PM ? ?

## 2021-08-06 NOTE — Progress Notes (Addendum)
Physical Therapy Treatment ?Patient Details ?Name: Douglas [name] ?MRN: [id number] ?DOB: 1950-07-15 ?Today's Date: 08/06/2021 ? ? ?History of Present Illness 71 y.o. male who presented 08/03/21 with worsening L foot pain and edema. Pt with age indeterminate bil lower extremity DVTs. S/p abdominal aortogram with L superficial femoral artery stent 4/26. Hospital stay complicated by hypotension. 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 PMH: pacemaker, DM, BPH, HTN, and CLL. ? ?  ?PT Comments  ? ? Pt received in supine, agreeable to physical therapy session with emphasis on bed level training for strengthening, per pt/RN plan for him to go to OR in next hour or less so defer OOB transfers. Practiced basic RLE and UE exercises in supine. Pt min guard to pull from 50 deg HOB posture to long sit x5 reps, plan to practice from lower HOB height and transition pt to EOB next session. BP assessed and MAP stable (70) for HOB at 0 degrees and HOB at 50 degrees. Defer LLE exercises due to thrombectomy procedure in afternoon. Pt reported 0/10 pain on the RLE without compression and 2/10 with compression. May need to have the compression sleeve measured and fitted for comfort to increase compliance. ?Pt will continue to benefit from physical therapy services to progress toward functional mobility goals.  ?Recommendations for follow up therapy are one component of a multi-disciplinary discharge planning process, led by the attending physician.  Recommendations may be updated based on patient status, additional functional criteria and insurance authorization. ? ?Follow Up Recommendations ? Home health PT (pending progress) ?  ?  ?Assistance Recommended at Discharge Frequent or constant Supervision/Assistance  ?Patient can return home with the following Assistance with cooking/housework;Assist for transportation;Help with stairs or ramp for entrance;A lot of help with  bathing/dressing/bathroom;A lot of help with walking and/or transfers ?  ?Equipment Recommendations ? None recommended by PT  ?  ?   ?Precautions / Restrictions Precautions ?Precautions: Fall ?Precaution Comments: R AKA 07/19/21; watch BP (low) ?Required Braces or Orthoses: Other Brace ?Other Brace: limb guard from previous surgery in the room ?Restrictions ?Weight Bearing Restrictions: Yes ?RLE Weight Bearing: Non weight bearing  ?  ? ?Mobility ? Bed Mobility ?Overal bed mobility: Needs Assistance ?Bed Mobility: Supine to Sit ?  ?  ?Supine to sit: Min Guard; HOB elevated ?  ?  ?General bed mobility comments: Total A +2 to transition pt superiorly in bed using bed pads from flat posture as pt unable to reach overhead rail due to B shoulder ROM limited to <90*. Pt able to lift back off HOB through pulling on bil bed side rails with HOB at 50 degree angle without physical assistance, min guard at most. ?  ? ? ? ?  ?Balance Overall balance assessment: Needs assistance ?Sitting-balance support: Bilateral upper extremity supported ?Sitting balance-Leahy Scale: Poor ?Sitting balance - Comments: Pt able to lift trunk off elevated HOB pulling on bil bed rails. Heavily reliant on B railings to maintain long sit. ?  ?  ? ?  ?Cognition Arousal/Alertness: Awake/alert ?Behavior During Therapy: [facility] for tasks assessed/performed ?Overall Cognitive Status: Within Functional Limits for tasks assessed ? General Comments: Pt pleasant, agreeable to PT session and WFL cognitively ?  ?  ? ?  ?Exercises General Exercises - Upper Extremity ?Shoulder Flexion: AROM, Both, 15 reps, Supine ?General Exercises - Lower Extremity ?Ankle Circles/Pumps: AROM, 10 reps, Supine, Left ?Hip ABduction/ADduction: AROM, Right, 10 reps, Supine ?Hip  Extension: AROM, Right, 10 reps, Supine ?Hip Flexion: AROM, Right, 5 reps, Supine ?Other Exercises ?Other Exercises: AROM; Both; strengthening; chest press;15x supine ? ?  ?General Comments General comments (skin  integrity, edema, etc.): BP: stable throughout session; Supine 99/57, 103/58 with HOB at 50 degrees ?  ?  ? ?Pertinent Vitals/Pain Pain Assessment ?Pain Assessment: 0-10 ?Pain Score: 2  (pt reports 0/10 pain without compression on the RLE and 2/10 pain with compression on the RLE) ?Faces Pain Scale: Hurts a little bit ?Pain Location: grimacing with end AROM of shoulders and R residual limb with compression sleeve donned properly ?Pain Descriptors / Indicators: Grimacing ?Pain Intervention(s): Limited activity within patient's tolerance  ? ? ?   ?   ? ?PT Goals (current goals can now be found in the care plan section) Acute Rehab PT Goals ?Patient Stated Goal: to go home and work with [facility] therapy ?PT Goal Formulation: With patient ?Time For Goal Achievement: 08/19/21 ?Progress towards PT goals: Progressing toward goals ? ?  ?Frequency ? ? ? Min 3X/week ? ? ? ?  ?PT Plan Current plan remains appropriate  ? ? ?   ?AM-PAC PT "6 Clicks" Mobility   ?Outcome Measure ? Help needed turning from your back to your side while in a flat bed without using bedrails?: A Lot ?Help needed moving from lying on your back to sitting on the side of a flat bed without using bedrails?: A Lot ?Help needed moving to and from a bed to a chair (including a wheelchair)?: Total ?Help needed standing up from a chair using your arms (e.g., wheelchair or bedside chair)?: Total ?Help needed to walk in hospital room?: Total ?Help needed climbing 3-5 steps with a railing? : Total ?6 Click Score: 10 ? ?  ?End of Session   ?Activity Tolerance: Patient tolerated treatment well ?Patient left: in bed;with call bell/phone within reach;with bed alarm set;with nursing/sitter in room ?Nurse Communication: Mobility status ?PT Visit Diagnosis: Muscle weakness (generalized) (M62.81) ?Pain - Right/Left: Right ?Pain - part of body: Leg ?  ? ? ?Time: 3817-7116 ?PT Time Calculation (min) (ACUTE ONLY): 24 min ? ?Charges:  $Therapeutic Exercise: 23-37 mins           ?          ? ?Wadie Lessen, SPTA ? ? ? ?Tiffany Mutch ?08/06/2021, 12:26 PM ? ?

## 2021-08-06 NOTE — Progress Notes (Signed)
? ?Progress Note ? ?Patient Name: Douglas [name] ?Date of Encounter: 08/06/2021 ? ?[facility] Cardiologist: None  ? ?Subjective  ? ?Patient feels much better this AM. Denies chest pain, palpitations, dizziness, SOB. BP has improved to 105/61. Feels better after receiving IV fluids  ? ?Inpatient Medications  ?  ?Scheduled Meds: ? albuterol  2.5 mg Nebulization BID  ? vitamin C  1,000 mg Oral Daily  ? aspirin EC  81 mg Oral Daily  ? Chlorhexidine Gluconate Cloth  6 each Topical Q0600  ? clopidogrel  75 mg Oral Q breakfast  ? docusate sodium  100 mg Oral BID  ? finasteride  5 mg Oral Daily  ? gabapentin  300 mg Oral QHS  ? melatonin  5 mg Oral QHS  ? methocarbamol  500 mg Oral TID  ? multivitamin with minerals  1 tablet Oral Daily  ? pantoprazole  40 mg Oral Daily  ? polyethylene glycol  17 g Oral BID  ? potassium chloride  20-40 mEq Oral Once  ? rosuvastatin  20 mg Oral Daily  ? sodium chloride flush  3 mL Intravenous Q12H  ? terazosin  15 mg Oral QHS  ? ?Continuous Infusions: ? sodium chloride 75 mL/hr at 08/06/21 0908  ? sodium chloride    ? heparin 2,100 Units/hr (08/06/21 5621)  ? ?PRN Meds: ?sodium chloride, acetaminophen, alum & mag hydroxide-simeth, guaiFENesin-dextromethorphan, hydrALAZINE, hydrALAZINE, HYDROmorphone (DILAUDID) injection, labetalol, labetalol, metoprolol tartrate, montelukast, ondansetron, ondansetron (ZOFRAN) IV, oxyCODONE, oxymetazoline, phenol, sodium chloride flush  ? ?Vital Signs  ?  ?Vitals:  ? 08/05/21 2000 08/06/21 0024 08/06/21 0429 08/06/21 0732  ?BP: (!) 103/49 (!) 106/59 (!) 111/47 105/61  ?Pulse: 99 (!) 101 92 100  ?Resp: 16 (!) '21 11 15  '$ ?Temp: 97.8 ?F (36.6 ?C) 98 ?F (36.7 ?C) (!) 97.5 ?F (36.4 ?C) 98.1 ?F (36.7 ?C)  ?TempSrc: Oral Oral Oral Oral  ?SpO2: 93% 97% 98% 96%  ?Weight:      ?Height:      ? ? ?Intake/Output Summary (Last 24 hours) at 08/06/2021 0954 ?Last data filed at 08/06/2021 3086 ?Gross per 24 hour  ?Intake 720 ml  ?Output 1910 ml  ?Net -1190 ml  ? ? ?   08/05/2021  ?  5:00 AM 08/04/2021  ?  2:10 PM 08/03/2021  ?  6:43 PM  ?Last 3 Weights  ?Weight (lbs) 199 lb 1.2 oz 195 lb 12.3 oz 220 lb  ?Weight (kg) 90.3 kg 88.8 kg 99.791 kg  ?   ? ?Telemetry  ?  ?V pacing, PVCs, infrequent 3-beat runs of NSVT - Personally Reviewed ? ?ECG  ?  ?No new tracings - Personally Reviewed ? ?Physical Exam  ? ?GEN: No acute distress. Laying comfortably in the bed  ?Neck: No JVD ?Cardiac: RRR, no murmurs, rubs, or gallops.  ?Respiratory: Clear to auscultation bilaterally. No increase WOB. 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 ?Labs  ?  ?High Sensitivity Troponin:  No results for input(s): TROPONINIHS in the last 720 hours.   ?Chemistry ?Recent Labs  ?Lab 08/04/21 ?0327 08/05/21 ?0703 08/06/21 ?0220  ?NA 127* 128* 128*  ?K 3.6 4.2 4.0  ?CL 98 99 101  ?CO2 20* 21* 20*  ?GLUCOSE 156* 129* 170*  ?BUN 32* 34* 31*  ?CREATININE 0.80 0.76 0.59*  ?CALCIUM 7.6* 8.5* 8.3*  ?MG  --  2.0  --   ?  GFRNONAA >60 >60 >60  ?ANIONGAP '9 8 7  '$ ?  ?Lipids  ?Recent Labs  ?Lab 08/05/21 ?0703  ?CHOL 128  ?TRIG 96  ?HDL 37*  ?[place] 72  ?CHOLHDL 3.5  ?  ?Hematology ?Recent Labs  ?Lab 08/04/21 ?0327 08/05/21 ?0703 08/06/21 ?0220  ?WBC 7.6 5.8 6.0  ?RBC 3.49* 3.42* 3.17*  ?HGB 9.9* 9.5* 9.0*  ?HCT 30.7* 30.2* 27.9*  ?MCV 88.0 88.3 88.0  ?MCH 28.4 27.8 28.4  ?MCHC 32.2 31.5 32.3  ?RDW 17.2* 17.1* 16.9*  ?PLT 350 367 398  ? ?Thyroid No results for input(s): TSH, FREET4 in the last 168 hours.  ?BNPNo results for input(s): BNP, PROBNP in the last 168 hours.  ?DDimer No results for input(s): DDIMER in the last 168 hours.  ? ?Radiology  ?  ?CT Angio Chest Pulmonary Embolism (PE) W or WO Contrast ? ?Result Date: 08/05/2021 ?CLINICAL DATA:  Status post left lower extremity angiogram with left SFA stent EXAM: CT ANGIOGRAPHY CHEST WITH CONTRAST TECHNIQUE: Multidetector CT imaging of the chest was  performed using the standard protocol during bolus administration of intravenous contrast. Multiplanar CT image reconstructions and MIPs were obtained to evaluate the vascular anatomy. RADIATION DOSE REDUCTION: This exam was performed according to the departmental dose-optimization program which includes automated exposure control, adjustment of the mA and/or kV according to patient size and/or use of iterative reconstruction technique. CONTRAST:  24m OMNIPAQUE IOHEXOL 350 MG/ML SOLN COMPARISON:  None. FINDINGS: Cardiovascular: Satisfactory opacification of the pulmonary arteries to the segmental level. No evidence of pulmonary embolism. Normal heart size. No pericardial effusion. Thoracic aortic atherosclerosis. Mediastinum/Nodes: No enlarged mediastinal, hilar, or axillary lymph nodes. Thyroid gland, trachea, and esophagus demonstrate no significant findings. Lungs/Pleura: Lungs are clear. No pleural effusion or pneumothorax. Upper Abdomen: Heterogeneous enhancing 4.2 cm masslike area in the spleen of indeterminate etiology. Musculoskeletal: No acute osseous abnormality. No aggressive osseous lesion. 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 Review of the MIP images confirms the above findings. IMPRESSION: 1. No pulmonary embolism. 2. No acute cardiopulmonary findings. 3. Heterogeneous enhancing 4.2 cm masslike area in the spleen of indeterminate etiology. Recommend further evaluation with an MRI of the abdomen without and with intravenous contrast for further characterization. 4. Aortic Atherosclerosis (ICD10-I70.0). Electronically Signed   By: HKathreen DevoidM.D.   On: 08/05/2021 16:17  ? ?PERIPHERAL VASCULAR CATHETERIZATION ? ?Result Date: 08/04/2021 ?Images from the original result were not included. Patient name: Douglas RosenwaldMRN: [id number]: [id number]: male  08/04/2021 Pre-operative Diagnosis: Left foot pain Post-operative diagnosis:  Same Surgeon:  WAnnamarie MajorProcedure Performed:  1.  Ultrasound-guided access, right femoral artery  2.  Abdominal aortogram  3.  Left lower extremity runoff  4.  Stent, left superficial femoral artery  5.  Conscious sedation, 71 minutes  6.  Closure device, Celt Indications: This is a 71year old gentleman recently status post right leg amputation who presented to the emergency department with swelling and pain in his left foot.  He has a known superficial femoral artery occlusion.  He was also diagnosed with a DVT.  He comes in today for arterial evaluation. Procedure:  The patient was identified in the holding area and taken to room 8.  The patient was then placed supine on the table and prepped and draped in the usual sterile fashion.  A time out was called.  Conscious sedation was administered with the use of  IV fentanyl and Versed under continuous physician and nurse monitoring.  Heart rate, blood pressure, and oxygen saturation were continuously monitored.  Total sedation time was 71 minutes.  Ultrasound was used to evaluate the right common femoral artery.  It was patent .  A digital ultrasound image was acquired.  A micropuncture needle was used to access the right common femoral artery under ultrasound guidance.  An 018 wire was advanced without resistance and a micropuncture sheath was placed.  The 018 wire was removed and a benson wire was placed.  The micropuncture sheath was exchanged for a 5 french sheath.  An omniflush catheter was advanced over the wire to the level of L-1.  An abdominal angiogram was obtained.  Next, using the omniflush catheter and a benson wire, the aortic bifurcation was crossed and the catheter was placed into theleft external iliac artery and left runoff was obtained.  Findings:  Aortogram: No significant renal artery stenosis was visualized.  The infrarenal abdominal was widely patent.  Bilateral  common and external iliac arteries widely patent.  Right Lower Extremity: Not evaluated.  Left Lower Extremity: The left common femoral and profundofemoral artery are patent without significant stenosis.  There is approx

## 2021-08-06 NOTE — Progress Notes (Signed)
ANTICOAGULATION CONSULT NOTE ? ?Pharmacy Consult for heparin ?Indication:  acute limb ischemia ? ?No Known Allergies ? ?Patient Measurements: ?Height: 6' (182.9 cm) ?Weight: 90.3 kg (199 lb 1.2 oz) ?IBW/kg (Calculated) : 77.6 ?Heparin Dosing Weight: 97 kg  ? ?Vital Signs: ?Temp: 97.8 ?F (36.6 ?C) (04/28 1654) ?Temp Source: Oral (04/28 1654) ?BP: 100/61 (04/28 1639) ?Pulse Rate: 68 (04/28 1654) ? ?Labs: ?Recent Labs  ?  08/03/21 ?1911 08/03/21 ?1919 08/04/21 ?0327 08/04/21 ?0330 08/04/21 ?2246 08/05/21 ?0703 08/06/21 ?0220  ?HGB 10.9*   < > 9.9*  --   --  9.5* 9.0*  ?HCT 34.4*   < > 30.7*  --   --  30.2* 27.9*  ?PLT 361  --  350  --   --  367 398  ?LABPROT 12.9  --  14.0  --   --   --   --   ?INR 1.0  --  1.1  --   --   --   --   ?HEPARINUNFRC  --   --   --    < > 0.18* 0.30 0.27*  ?CREATININE 0.82   < > 0.80  --   --  0.76 0.59*  ? < > = values in this interval not displayed.  ? ? ?Estimated Creatinine Clearance: 91.6 mL/min (A) (by C-G formula based on SCr of 0.59 mg/dL (L)). ? ? ?Medical History: ?Past Medical History:  ?Diagnosis Date  ? BPH (benign prostatic hyperplasia)   ? CLL (chronic lymphocytic leukemia) ([place])   ? Hypertension   ? Pacemaker   ? Medtronic Device  ? ? ?Medications:  ?Medications Prior to Admission  ?Medication Sig Dispense Refill Last Dose  ? albuterol (PROVENTIL) (2.5 MG/3ML) 0.083% nebulizer solution Take 2.5 mg by nebulization every 6 (six) hours as needed for wheezing or shortness of breath.   08/03/2021  ? albuterol (VENTOLIN HFA) 108 (90 Base) MCG/ACT inhaler Inhale 2 puffs into the lungs 4 (four) times daily. 8.5 g 0 unknown  ? amLODipine (NORVASC) 10 MG tablet Take 1 tablet (10 mg total) by mouth daily. 30 tablet 0 08/02/2021  ? ascorbic acid (VITAMIN C) 1000 MG tablet Take 1 tablet (1,000 mg total) by mouth daily. 30 tablet 0 08/03/2021  ? docusate sodium (COLACE) 100 MG capsule Take 1 capsule (100 mg total) by mouth 2 (two) times daily. (Patient taking differently: Take 100 mg by mouth  daily.) 10 capsule 0 08/02/2021  ? finasteride (PROSCAR) 5 MG tablet Take 1 tablet (5 mg total) by mouth daily. 30 tablet 0 08/02/2021  ? gabapentin (NEURONTIN) 300 MG capsule Take 1 capsule (300 mg total) by mouth at bedtime. 30 capsule 0 08/02/2021  ? IMBRUVICA 420 MG tablet TAKE 1 TABLET BY MOUTH ONCE  DAILY WITH A FULL GLASS OF WATER (Patient taking differently: 420 mg daily. WITH A FULL GLASS OF  WATER) 28 tablet 1 08/03/2021 at 1200  ? melatonin 5 MG TABS Take 1 tablet (5 mg total) by mouth at bedtime. 30 tablet 0 08/02/2021  ? methocarbamol (ROBAXIN) 500 MG tablet Take 1 tablet (500 mg total) by mouth 3 (three) times daily. (Patient taking differently: Take 500 mg by mouth every 8 (eight) hours as needed for muscle spasms.) 90 tablet 0 Past Week  ? oxyCODONE (OXY IR/ROXICODONE) 5 MG immediate release tablet Take 1 tablet (5 mg total) by mouth every 4 (four) hours as needed for moderate pain. 30 tablet 0 Past Week  ? oxymetazoline (AFRIN) 0.05 % nasal spray Place 1  spray into both nostrils 2 (two) times daily as needed for congestion.   unknown  ? terazosin (HYTRIN) 5 MG capsule Take 3 capsules (15 mg total) by mouth at bedtime. (Patient taking differently: Take 15 mg by mouth in the morning.) 90 capsule 0 08/03/2021  ? valsartan-hydrochlorothiazide (DIOVAN-HCT) 320-25 MG tablet Take 1 tablet by mouth daily.   08/03/2021  ? aspirin EC 81 MG EC tablet Take 1 tablet (81 mg total) by mouth daily. Swallow whole. (Patient not taking: Reported on 08/03/2021) 30 tablet 11 Completed Course  ? irbesartan (AVAPRO) 300 MG tablet Take 1 tablet (300 mg total) by mouth daily. (Patient not taking: Reported on 08/03/2021) 30 tablet 0 Completed Course  ? metoprolol tartrate (LOPRESSOR) 25 MG tablet Take 1 tablet (25 mg total) by mouth every 8 (eight) hours. (Patient not taking: Reported on 08/03/2021) 90 tablet 0 Completed Course  ? montelukast (SINGULAIR) 10 MG tablet Take 1 tablet (10 mg total) by mouth daily as needed (allergies).  (Patient not taking: Reported on 08/03/2021) 10 tablet 0 Completed Course  ? Multiple Vitamin (MULTIVITAMIN WITH MINERALS) TABS tablet Take 1 tablet by mouth daily. (Patient not taking: Reported on 08/03/2021)   Completed Course  ? pantoprazole (PROTONIX) 40 MG tablet Take 1 tablet (40 mg total) by mouth 2 (two) times daily. (Patient not taking: Reported on 08/03/2021) 60 tablet 0 Completed Course  ? polyethylene glycol (MIRALAX / GLYCOLAX) 17 g packet Take 17 g by mouth 2 (two) times daily. (Patient not taking: Reported on 08/03/2021) 14 each 0 Completed Course  ? rosuvastatin (CRESTOR) 20 MG tablet Take 1 tablet (20 mg total) by mouth daily. (Patient not taking: Reported on 08/03/2021) 30 tablet 0 Completed Course  ? ? ?Assessment: ?71 years of age male s/p mechanical thrombectomy for DVTs to continue on IV Heparin per consult. Pharmacy to dose.  ? ?Last level was low and increased rate to 2100 units/hr.  ?Will continue at that rate and recheck a level in 6 hours.  ? ?Goal of Therapy:  ?Heparin level 0.3-0.7 units/ml ?Monitor platelets by anticoagulation protocol: Yes ?  ?Plan:  ?Continue heparin to 2100 units/hr (21 mL/hr).  ?Recheck heparin level in 6 hours.  ?Daily heparin level and CBC ? ?Sloan Leiter, PharmD, BCPS, BCCCP ?Clinical Pharmacist ?Please refer to [facility] for [place] numbers ?08/06/2021 5:07 PM ? ? ? ? ?

## 2021-08-06 NOTE — Progress Notes (Signed)
Amiodarone Drug - Drug Interaction Consult Note ? ?Recommendations: ?No significant drug interactions noted. Continue amiodarone as prescribed  ? ?Amiodarone is metabolized by the cytochrome P450 system and therefore has the potential to cause many drug interactions. Amiodarone has an average plasma half-life of 50 days (range 20 to 100 days).  ? ?There is potential for drug interactions to occur several weeks or months after stopping treatment and the onset of drug interactions may be slow after initiating amiodarone.  ? ?'[]'$  Statins: Increased risk of myopathy. Simvastatin- restrict dose to '20mg'$  daily. ?Other statins: counsel patients to report any muscle pain or weakness immediately. ? ?'[]'$  Anticoagulants: Amiodarone can increase anticoagulant effect. Consider warfarin dose reduction. Patients should be monitored closely and the dose of anticoagulant altered accordingly, remembering that amiodarone levels take several weeks to stabilize. ? ?'[]'$  Antiepileptics: Amiodarone can increase plasma concentration of phenytoin, the dose should be reduced. Note that small changes in phenytoin dose can result in large changes in levels. Monitor patient and counsel on signs of toxicity. ? ?'[]'$  Beta blockers: increased risk of bradycardia, AV block and myocardial depression. Sotalol - avoid concomitant use. ? ?'[]'$   Calcium channel blockers (diltiazem and verapamil): increased risk of bradycardia, AV block and myocardial depression. ? ?'[]'$   Cyclosporine: Amiodarone increases levels of cyclosporine. Reduced dose of cyclosporine is recommended. ? ?'[]'$  Digoxin dose should be halved when amiodarone is started. ? ?'[]'$  Diuretics: increased risk of cardiotoxicity if hypokalemia occurs. ? ?'[]'$  Oral hypoglycemic agents (glyburide, glipizide, glimepiride): increased risk of hypoglycemia. Patient's glucose levels should be monitored closely when initiating amiodarone therapy.  ? ?'[]'$  Drugs that prolong the QT interval:  Torsades de pointes risk may  be increased with concurrent use - avoid if possible.  Monitor QTc, also keep magnesium/potassium WNL if concurrent therapy can't be avoided. ? Antibiotics: e.g. fluoroquinolones, erythromycin. ? Antiarrhythmics: e.g. quinidine, procainamide, disopyramide, sotalol. ? Antipsychotics: e.g. phenothiazines, haloperidol. ?  Lithium, tricyclic antidepressants, and methadone. ?Thank You,  ? ?Albertina Parr, PharmD., BCCCP ?Clinical Pharmacist ?Please refer to [facility] for unit-specific pharmacist  ? ? ? ?  ?

## 2021-08-06 NOTE — Op Note (Signed)
DATE OF SERVICE: 08/06/2021 ? ?PATIENT:  Douglas [name]  71 y.o. male ? 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 ?POST-OPERATIVE DIAGNOSIS:  Same ? ?PROCEDURE:   ?1) ultrasound guided left small saphenous vein access ?2) intravascular ultrasound of inferior vena cava, left iliac veins, left femoral veins, left popliteal vein ?3) mechanical thrombectomy of inferior vena cava, left iliac veins, left femoral veins, left popliteal vein. ?4) left lower extremity and central venogram (58m total contrast) ?5) conscious sedation (66 minutes) ? ?SURGEON:  Surgeon(s) and Role: ?   * HCherre Robins MD - Primary ? ?ASSISTANT: none ? ?ANESTHESIA:   local and IV sedation ? ?EBL: 1038m? ?BLOOD ADMINISTERED:none ? ?DRAINS: none  ? ?LOCAL MEDICATIONS USED:  LIDOCAINE  ? ?SPECIMEN:  none ? ?COUNTS: confirmed correct. ? ?TOURNIQUET:  none ? ?PATIENT DISPOSITION:  PACU - hemodynamically stable. ?  ?Delay start of Pharmacological VTE agent (>24hrs) due to surgical blood loss or risk of bleeding: no ? ?INDICATION FOR PROCEDURE: DaLavin Petteways a [age].o. 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 After careful discussion of risks, benefits, and alternatives the patient was offered mechanical thrombectomy. The patient  understood and wished to proceed. ? ?OPERATIVE FINDINGS: extensive deep venous thrombosis extending from peripheral IVC to popliteal vein on the left. Almost complete resolution after Inari ClotTriever x 8 passes.  ? ?DESCRIPTION OF PROCEDURE: After identification of the patient in the pre-operative holding area, the patient was transferred to the operating room. The patient was positioned prone on the operating room table. Anesthesia was induced. The left popliteal fossa was prepped and draped in standard fashion. A surgical pause was performed confirming correct patient, procedure, and operative location. ? ?Using intraoperative  ultrasound, the small saphenous vein in the proximal left calf was accessed with micropuncture technique.  Glidewire advantage was navigated into the iliac veins on the left side.  Her access was upsized gradually to 8 [place]? ?Over the wire, a Berenstein catheter was advanced to help select the inferior vena cava.  I was not able to access this blindly.  Central venogram was performed.  This showed severe clot burden throughout the iliac bifurcation.  I was able to find a flow channel, and navigated the Glidewire through this into the right brachiocephalic vein. ? ?We upsized our access to a [id number] purpose built sheath for mechanical thrombectomy.  The distal filter was deployed in standard fashion.  An InDevelopment worker, communityas brought onto the field and prepped in standard fashion.  I made 8 passes through the inferior vena cava into the left iliac veins, the left femoral veins And left popliteal veins.  Copious clot returned with each pass.  At the final pass, limited clot returned. ? ?The clot triever device was removed.  Intravascular ultrasound was advanced over the wire.  Intravascular ultrasound showed near complete resolution of the thrombus burden throughout the IVC, iliac veins, femoral veins, and popliteal veins.   ? ?Ascending venograms were performed in stations from the sheath to the IVC.  This redemonstrated almost complete resolution of the thrombus burden. ? ?Satisfied end of the case here.  All endovascular equipment was removed.  A figure-of-eight stitch was placed around the access site.  The sheath was removed.  The suture was secured down.  Hemostasis was achieved.  A bandage was applied to the popliteal space. ? ?Upon completion of the case instrument and sharps counts were confirmed correct.  The patient was transferred to the  PACU in good condition. I was present for all portions of the procedure. ? ?Yevonne Aline. Stanford Breed, MD ?[facility] ?Office  Phone Number: [phone] ?08/06/2021 4:18 PM ? ? ? ?

## 2021-08-06 NOTE — Progress Notes (Signed)
ANTICOAGULATION CONSULT NOTE ? ?Pharmacy Consult for heparin ?Indication:  acute limb ischemia ? ?No Known Allergies ? ?Patient Measurements: ?Height: 6' (182.9 cm) ?Weight: 90.3 kg (199 lb 1.2 oz) ?IBW/kg (Calculated) : 77.6 ?Heparin Dosing Weight: 97 kg  ? ?Vital Signs: ?Temp: 97.5 ?F (36.4 ?C) (04/28 0429) ?Temp Source: Oral (04/28 0429) ?BP: 111/47 (04/28 0429) ?Pulse Rate: 92 (04/28 0429) ? ?Labs: ?Recent Labs  ?  08/03/21 ?1911 08/03/21 ?1919 08/04/21 ?0327 08/04/21 ?0330 08/04/21 ?2246 08/05/21 ?0703 08/06/21 ?0220  ?HGB 10.9*   < > 9.9*  --   --  9.5* 9.0*  ?HCT 34.4*   < > 30.7*  --   --  30.2* 27.9*  ?PLT 361  --  350  --   --  367 398  ?LABPROT 12.9  --  14.0  --   --   --   --   ?INR 1.0  --  1.1  --   --   --   --   ?HEPARINUNFRC  --   --   --    < > 0.18* 0.30 0.27*  ?CREATININE 0.82   < > 0.80  --   --  0.76 0.59*  ? < > = values in this interval not displayed.  ? ? ? ?Estimated Creatinine Clearance: 91.6 mL/min (A) (by C-G formula based on SCr of 0.59 mg/dL (L)). ? ? ?Medical History: ?Past Medical History:  ?Diagnosis Date  ? BPH (benign prostatic hyperplasia)   ? CLL (chronic lymphocytic leukemia) ([place])   ? Hypertension   ? Pacemaker   ? Medtronic Device  ? ? ?Medications:  ?Medications Prior to Admission  ?Medication Sig Dispense Refill Last Dose  ? albuterol (PROVENTIL) (2.5 MG/3ML) 0.083% nebulizer solution Take 2.5 mg by nebulization every 6 (six) hours as needed for wheezing or shortness of breath.   08/03/2021  ? albuterol (VENTOLIN HFA) 108 (90 Base) MCG/ACT inhaler Inhale 2 puffs into the lungs 4 (four) times daily. 8.5 g 0 unknown  ? amLODipine (NORVASC) 10 MG tablet Take 1 tablet (10 mg total) by mouth daily. 30 tablet 0 08/02/2021  ? ascorbic acid (VITAMIN C) 1000 MG tablet Take 1 tablet (1,000 mg total) by mouth daily. 30 tablet 0 08/03/2021  ? docusate sodium (COLACE) 100 MG capsule Take 1 capsule (100 mg total) by mouth 2 (two) times daily. (Patient taking differently: Take 100 mg by  mouth daily.) 10 capsule 0 08/02/2021  ? finasteride (PROSCAR) 5 MG tablet Take 1 tablet (5 mg total) by mouth daily. 30 tablet 0 08/02/2021  ? gabapentin (NEURONTIN) 300 MG capsule Take 1 capsule (300 mg total) by mouth at bedtime. 30 capsule 0 08/02/2021  ? IMBRUVICA 420 MG tablet TAKE 1 TABLET BY MOUTH ONCE  DAILY WITH A FULL GLASS OF WATER (Patient taking differently: 420 mg daily. WITH A FULL GLASS OF  WATER) 28 tablet 1 08/03/2021 at 1200  ? melatonin 5 MG TABS Take 1 tablet (5 mg total) by mouth at bedtime. 30 tablet 0 08/02/2021  ? methocarbamol (ROBAXIN) 500 MG tablet Take 1 tablet (500 mg total) by mouth 3 (three) times daily. (Patient taking differently: Take 500 mg by mouth every 8 (eight) hours as needed for muscle spasms.) 90 tablet 0 Past Week  ? oxyCODONE (OXY IR/ROXICODONE) 5 MG immediate release tablet Take 1 tablet (5 mg total) by mouth every 4 (four) hours as needed for moderate pain. 30 tablet 0 Past Week  ? oxymetazoline (AFRIN) 0.05 % nasal spray Place  1 spray into both nostrils 2 (two) times daily as needed for congestion.   unknown  ? terazosin (HYTRIN) 5 MG capsule Take 3 capsules (15 mg total) by mouth at bedtime. (Patient taking differently: Take 15 mg by mouth in the morning.) 90 capsule 0 08/03/2021  ? valsartan-hydrochlorothiazide (DIOVAN-HCT) 320-25 MG tablet Take 1 tablet by mouth daily.   08/03/2021  ? aspirin EC 81 MG EC tablet Take 1 tablet (81 mg total) by mouth daily. Swallow whole. (Patient not taking: Reported on 08/03/2021) 30 tablet 11 Completed Course  ? irbesartan (AVAPRO) 300 MG tablet Take 1 tablet (300 mg total) by mouth daily. (Patient not taking: Reported on 08/03/2021) 30 tablet 0 Completed Course  ? metoprolol tartrate (LOPRESSOR) 25 MG tablet Take 1 tablet (25 mg total) by mouth every 8 (eight) hours. (Patient not taking: Reported on 08/03/2021) 90 tablet 0 Completed Course  ? montelukast (SINGULAIR) 10 MG tablet Take 1 tablet (10 mg total) by mouth daily as needed  (allergies). (Patient not taking: Reported on 08/03/2021) 10 tablet 0 Completed Course  ? Multiple Vitamin (MULTIVITAMIN WITH MINERALS) TABS tablet Take 1 tablet by mouth daily. (Patient not taking: Reported on 08/03/2021)   Completed Course  ? pantoprazole (PROTONIX) 40 MG tablet Take 1 tablet (40 mg total) by mouth 2 (two) times daily. (Patient not taking: Reported on 08/03/2021) 60 tablet 0 Completed Course  ? polyethylene glycol (MIRALAX / GLYCOLAX) 17 g packet Take 17 g by mouth 2 (two) times daily. (Patient not taking: Reported on 08/03/2021) 14 each 0 Completed Course  ? rosuvastatin (CRESTOR) 20 MG tablet Take 1 tablet (20 mg total) by mouth daily. (Patient not taking: Reported on 08/03/2021) 30 tablet 0 Completed Course  ? ? ?Assessment: ?41 YOM now s/p cath lab for PV angio -  ? ?Heparin level subtherapeutic: 0.27, per RN no issues with infusion or s/sx of bleeding  ? ?Goal of Therapy:  ?Heparin level 0.3-0.7 units/ml ?Monitor platelets by anticoagulation protocol: Yes ?  ?Plan:  ?Increase heparin to 2100 units/h ?Daily heparin level and CBC ? ?Georga Bora, PharmD ?Clinical Pharmacist ?08/06/2021 4:40 AM ?Please check AMION for all [place] numbers ? ? ? ? ?

## 2021-08-06 NOTE — Progress Notes (Signed)
Brief note:  ? ?I was called by nursing this evening with concerns for ventricular tachycardia.  ?Prior to my arrival to the hospital, I initiated an amiodarone drip.  Pads were placed on the patient. ?On arrival, Douglas [name] was assessed and noted to have mild labored breathing, mild chest pain.  His rate was noted from the 110s to 120s.  He was normotensive.  No hypoxia.  Nonrebreather. ?The rhythm was evaluated and appeared to be supraventricular arrhythmia.  I continued the amiodarone, and call my cardiology colleagues.  They were kind enough to come down right away to assess Douglas [name].  We discussed his current therapy, which Dr. Debara [name] recommended continuing. ?I discussed the likely etiology for the new arrhythmia was possible pulmonary embolus status post ileal caval mechanical thrombectomy. ?We discussed the role of CT angiogram to evaluate for PE, but did not feel this would change management.  Unfortunately, with the current rate, an echo to assess for right heart strain would demonstrate little diagnostic value. ? ?Plan: ?We will continue amiodarone drip overnight, if tachycardia remains, will administer 25 mg of oral metoprolol. ?Labs are pending, including troponin. ? ?Should he become hypotensive requiring cardioversion, my plan will be to admit to the intensive care unit for further management. ? ?Douglas John MD ?

## 2021-08-06 NOTE — Significant Event (Signed)
Rapid Response Event Note  ? ?Reason for Call :  ?Wide-complex rhythm concerning for v-tach ? ?Initial Focused Assessment:  ?Pt lying in bed with eyes open, alert and oriented. At my initial evaluation, pt denied chest pain, lightheadedness but did c/o SOB.  He later began to c/o mild chest pain. Lungs clear/diminished. Skin cool to touch.  ? ?Monitor with v-paced rhythm.  ? ?T-97.8, HR-120-150s, BP-104/77, RR-30, SpO2-100% on 4L [place].  ? ?LR bolus started, pt placed on NRB mask for SOB, new PIV obtained, and amiod bolus started.  ? ?Interventions:  ?1L LR bolus, LR@ 100cc/hr ?EKG ?New PIV  ?Amiod '150mg'$  bolus and continuous gtt ?CBCD, BMP, Mg ?Cardiology consulted ?Plan of Care:  ?Pt HR better after amiod bolus. His BP initially dropped after bolus but this is better now. Wean NRB back to [place] as SpO2/WOB allows. Await labs values. Continue to monitor pt closely. Call RRT if further assistance needed.  ? ?Event Summary:  ? ?MD Notified: Dr. Virl Cagey notified PTA RRT, Dr. Gerri Lins MD) consulted and came to bedside. ?Call [id number] ?Arrival Time:1900 ?End Time:2010 ? ?Dillard Essex, RN ?

## 2021-08-06 NOTE — Significant Event (Signed)
? ?  Asked by vascular surgery to see the patient urgently for concerns of tachycardia.  The patient was thought to go into ventricular tachycardia.  This was communicated to the vascular surgeon who came in to see the patient.  Prior to his arrival amiodarone was ordered and started.  I evaluated the patient at the bedside.  He was noted to be in a regular tachycardia with a paced rhythm.  This is not ventricular tachycardia rather I suspect either an atrial tachycardia or atrial flutter.  He seems to be tracking the upper rates.  Amiodarone should be helpful for this and I would recommend continuing it.  We could certainly add metoprolol if needed for additional rate control.  If he becomes hypotensive, then I would agree with IV fluids.  Earlier he was hypotensive and responded to fluids, suggesting some possible right heart strain.  He was noted to have DVT and is status post thrombectomy with concern for probable pulmonary embolization.  As he is on heparin, I did not see the utility for a contrast CT since it is likely the diagnosis.  Would recommend a repeat limited echo once his heart rate is better controlled or if he gets into sinus rhythm.  Troponins have been ordered.  He remains in guarded condition.  Cardiology will follow with you. ? ?TIME SPENT WITH PATIENT: ?35 minutes of direct patient care. More than 50% of that time was spent on coordination of care and counseling regarding atrial flutter, hypotension, probable pulmonary embolus. ? ?Pixie Casino, MD, [facility], FACP  ?[place]  ?Medical Director of the [facility]  ?[facility] ?Diplomate of the [employer] of Clinical Lipidology ?Attending Cardiologist  ?Direct Dial: [phone]  Fax: [phone]  ?Website:  [email or web address] ? ?

## 2021-08-07 ENCOUNTER — Inpatient Hospital Stay (HOSPITAL_COMMUNITY): Payer: 59

## 2021-08-07 ENCOUNTER — Other Ambulatory Visit: Payer: Self-pay | Admitting: Oncology

## 2021-08-07 DIAGNOSIS — I471 Supraventricular tachycardia: Secondary | ICD-10-CM | POA: Diagnosis not present

## 2021-08-07 DIAGNOSIS — C9110 Chronic lymphocytic leukemia of B-cell type not having achieved remission: Secondary | ICD-10-CM

## 2021-08-07 DIAGNOSIS — R0602 Shortness of breath: Secondary | ICD-10-CM

## 2021-08-07 DIAGNOSIS — I2609 Other pulmonary embolism with acute cor pulmonale: Secondary | ICD-10-CM | POA: Diagnosis not present

## 2021-08-07 LAB — BRAIN NATRIURETIC PEPTIDE: B Natriuretic Peptide: 208.7 pg/mL — ABNORMAL HIGH (ref 0.0–100.0)

## 2021-08-07 LAB — ECHOCARDIOGRAM COMPLETE
Area-P 1/2: 4.49 cm2
Height: 72 in
S' Lateral: 3.1 cm
Weight: 3185.21 oz

## 2021-08-07 LAB — CBC
HCT: 26 % — ABNORMAL LOW (ref 39.0–52.0)
Hemoglobin: 8 g/dL — ABNORMAL LOW (ref 13.0–17.0)
MCH: 27.5 pg (ref 26.0–34.0)
MCHC: 30.8 g/dL (ref 30.0–36.0)
MCV: 89.3 fL (ref 80.0–100.0)
Platelets: 371 10*3/uL (ref 150–400)
RBC: 2.91 MIL/uL — ABNORMAL LOW (ref 4.22–5.81)
RDW: 17.2 % — ABNORMAL HIGH (ref 11.5–15.5)
WBC: 4.2 10*3/uL (ref 4.0–10.5)
nRBC: 0 % (ref 0.0–0.2)

## 2021-08-07 LAB — BASIC METABOLIC PANEL
Anion gap: 8 (ref 5–15)
BUN: 18 mg/dL (ref 8–23)
CO2: 20 mmol/L — ABNORMAL LOW (ref 22–32)
Calcium: 8.2 mg/dL — ABNORMAL LOW (ref 8.9–10.3)
Chloride: 102 mmol/L (ref 98–111)
Creatinine, Ser: 0.6 mg/dL — ABNORMAL LOW (ref 0.61–1.24)
GFR, Estimated: >60 mL/min (ref >60)
Glucose, Bld: 217 mg/dL — ABNORMAL HIGH (ref 70–99)
Potassium: 4.4 mmol/L (ref 3.5–5.1)
Sodium: 130 mmol/L — ABNORMAL LOW (ref 135–145)

## 2021-08-07 LAB — MAGNESIUM: Magnesium: 1.9 mg/dL (ref 1.7–2.4)

## 2021-08-07 LAB — HEPARIN LEVEL (UNFRACTIONATED): Heparin Unfractionated: 0.64 IU/mL (ref 0.30–0.70)

## 2021-08-07 NOTE — Progress Notes (Addendum)
?  Progress Note ? ? ? ?08/07/2021 ?8:19 AM ?1 Day Post-Op ? ?Subjective: No chest pain this morning. Tachypnea improved, subjective labored breathing improved.  ? ?Vitals:  ? 08/07/21 0530 08/07/21 0710  ?BP: 100/61 115/76  ?Pulse: 93   ?Resp: 15   ?Temp: 98.6 ?F (37 ?C)   ?SpO2: 100%   ? ? ?Physical Exam: ?Awake alert oriented  ?nonlabored respirations, abel to converse without air hunger ?Abdomen is soft ?Left lower extremity soft, access site soft ?1+ palpable left posterior tibial pulse ?Right above-knee amputation healing well ? ?CBC ?   ?Component Value Date/Time  ? WBC 4.6 08/06/2021 2303  ? RBC 2.82 (L) 08/06/2021 2303  ? HGB 8.0 (L) 08/06/2021 2303  ? HGB 14.9 05/04/2021 0752  ? HCT 25.0 (L) 08/06/2021 2303  ? PLT 363 08/06/2021 2303  ? PLT 352 05/04/2021 0752  ? MCV 88.7 08/06/2021 2303  ? MCH 28.4 08/06/2021 2303  ? MCHC 32.0 08/06/2021 2303  ? RDW 17.0 (H) 08/06/2021 2303  ? LYMPHSABS 0.4 (L) 08/06/2021 2002  ? MONOABS 0.0 (L) 08/06/2021 2002  ? EOSABS 0.0 08/06/2021 2002  ? BASOSABS 0.1 08/06/2021 2002  ? ? ?BMET ?   ?Component Value Date/Time  ? NA 128 (L) 08/06/2021 2002  ? K 4.2 08/06/2021 2002  ? CL 104 08/06/2021 2002  ? CO2 19 (L) 08/06/2021 2002  ? GLUCOSE 198 (H) 08/06/2021 2002  ? BUN 22 08/06/2021 2002  ? CREATININE 0.54 (L) 08/06/2021 2002  ? CREATININE 0.94 05/04/2021 0752  ? CALCIUM 7.9 (L) 08/06/2021 2002  ? GFRNONAA >60 08/06/2021 2002  ? GFRNONAA >60 05/04/2021 0752  ? GFRAA >60 01/08/2020 0810  ? ? ?INR ?   ?Component Value Date/Time  ? INR 1.1 08/04/2021 0327  ? ? ? ?Intake/Output Summary (Last 24 hours) at 08/07/2021 0819 ?Last data filed at 08/07/2021 0530 ?Gross per 24 hour  ?Intake 2131.06 ml  ?Output 1045 ml  ?Net 1086.06 ml  ? ? ? ? ?Assessment/plan:  71 y.o. male is s/p right above-knee amputation, 4/26 left SFA stent, 4/28 L iliocaval venous suction thrombectomy ? ?S/p thrombectomy, supraventricular rhythm abnormalities, atrial flutter, continued on amiodarone drip ?Appreciate  help from cardiology.  All home cardiac meds were stopped several days ago due to hypotension.  Patient currently normotensive, relatively asymptomatic -no chest pain, some subjective shortness of breath. ? ?Think there is a high likelihood iliocaval thrombectomy resulted in pulmonary embolus which likely led to rhythm changes.  Patient has been off of home regimen which is also a contributing factor.  CT scan was not ordered overnight as it would not change current management.  He would benefit from echocardiography today to assess for right heart strain. ?I will defer medication changes to cardiology. ?Will order new labs this morning. ?OOB to chair ?PT pending echo results, so likley hold to tomorrow ? ? ?Broadus John MD ?[facility] ?08/07/2021 ?8:19 AM ? ?

## 2021-08-07 NOTE — Progress Notes (Signed)
ANTICOAGULATION CONSULT NOTE ? ?Pharmacy Consult for heparin ?Indication:  acute limb ischemia ? ?No Known Allergies ? ?Patient Measurements: ?Height: 6' (182.9 cm) ?Weight: 90.3 kg (199 lb 1.2 oz) ?IBW/kg (Calculated) : 77.6 ?Heparin Dosing Weight: 97 kg  ? ?Vital Signs: ?Temp: 98.2 ?F (36.8 ?C) (04/28 2324) ?Temp Source: Oral (04/28 2324) ?BP: 90/63 (04/28 2324) ?Pulse Rate: 101 (04/28 2324) ? ?Labs: ?Recent Labs  ?  08/04/21 ?0327 08/04/21 ?0330 08/05/21 ?0703 08/06/21 ?0220 08/06/21 ?2002 08/06/21 ?2303  ?HGB 9.9*  --  9.5* 9.0* 8.3* 8.0*  ?HCT 30.7*  --  30.2* 27.9* 26.1* 25.0*  ?PLT 350  --  367 398 357 363  ?LABPROT 14.0  --   --   --   --   --   ?INR 1.1  --   --   --   --   --   ?HEPARINUNFRC  --    < > 0.30 0.27*  --  0.62  ?CREATININE 0.80  --  0.76 0.59* 0.54*  --   ?TROPONINIHS  --   --   --   --  80*  --   ? < > = values in this interval not displayed.  ? ? ? ?Estimated Creatinine Clearance: 91.6 mL/min (A) (by C-G formula based on SCr of 0.54 mg/dL (L)). ? ? ?Medical History: ?Past Medical History:  ?Diagnosis Date  ? BPH (benign prostatic hyperplasia)   ? CLL (chronic lymphocytic leukemia) ([place])   ? Hypertension   ? Pacemaker   ? Medtronic Device  ? ? ?Medications:  ?Medications Prior to Admission  ?Medication Sig Dispense Refill Last Dose  ? albuterol (PROVENTIL) (2.5 MG/3ML) 0.083% nebulizer solution Take 2.5 mg by nebulization every 6 (six) hours as needed for wheezing or shortness of breath.   08/03/2021  ? albuterol (VENTOLIN HFA) 108 (90 Base) MCG/ACT inhaler Inhale 2 puffs into the lungs 4 (four) times daily. 8.5 g 0 unknown  ? amLODipine (NORVASC) 10 MG tablet Take 1 tablet (10 mg total) by mouth daily. 30 tablet 0 08/02/2021  ? ascorbic acid (VITAMIN C) 1000 MG tablet Take 1 tablet (1,000 mg total) by mouth daily. 30 tablet 0 08/03/2021  ? docusate sodium (COLACE) 100 MG capsule Take 1 capsule (100 mg total) by mouth 2 (two) times daily. (Patient taking differently: Take 100 mg by mouth daily.)  10 capsule 0 08/02/2021  ? finasteride (PROSCAR) 5 MG tablet Take 1 tablet (5 mg total) by mouth daily. 30 tablet 0 08/02/2021  ? gabapentin (NEURONTIN) 300 MG capsule Take 1 capsule (300 mg total) by mouth at bedtime. 30 capsule 0 08/02/2021  ? IMBRUVICA 420 MG tablet TAKE 1 TABLET BY MOUTH ONCE  DAILY WITH A FULL GLASS OF WATER (Patient taking differently: 420 mg daily. WITH A FULL GLASS OF  WATER) 28 tablet 1 08/03/2021 at 1200  ? melatonin 5 MG TABS Take 1 tablet (5 mg total) by mouth at bedtime. 30 tablet 0 08/02/2021  ? methocarbamol (ROBAXIN) 500 MG tablet Take 1 tablet (500 mg total) by mouth 3 (three) times daily. (Patient taking differently: Take 500 mg by mouth every 8 (eight) hours as needed for muscle spasms.) 90 tablet 0 Past Week  ? oxyCODONE (OXY IR/ROXICODONE) 5 MG immediate release tablet Take 1 tablet (5 mg total) by mouth every 4 (four) hours as needed for moderate pain. 30 tablet 0 Past Week  ? oxymetazoline (AFRIN) 0.05 % nasal spray Place 1 spray into both nostrils 2 (two) times daily as  needed for congestion.   unknown  ? terazosin (HYTRIN) 5 MG capsule Take 3 capsules (15 mg total) by mouth at bedtime. (Patient taking differently: Take 15 mg by mouth in the morning.) 90 capsule 0 08/03/2021  ? valsartan-hydrochlorothiazide (DIOVAN-HCT) 320-25 MG tablet Take 1 tablet by mouth daily.   08/03/2021  ? aspirin EC 81 MG EC tablet Take 1 tablet (81 mg total) by mouth daily. Swallow whole. (Patient not taking: Reported on 08/03/2021) 30 tablet 11 Completed Course  ? irbesartan (AVAPRO) 300 MG tablet Take 1 tablet (300 mg total) by mouth daily. (Patient not taking: Reported on 08/03/2021) 30 tablet 0 Completed Course  ? metoprolol tartrate (LOPRESSOR) 25 MG tablet Take 1 tablet (25 mg total) by mouth every 8 (eight) hours. (Patient not taking: Reported on 08/03/2021) 90 tablet 0 Completed Course  ? montelukast (SINGULAIR) 10 MG tablet Take 1 tablet (10 mg total) by mouth daily as needed (allergies). (Patient  not taking: Reported on 08/03/2021) 10 tablet 0 Completed Course  ? Multiple Vitamin (MULTIVITAMIN WITH MINERALS) TABS tablet Take 1 tablet by mouth daily. (Patient not taking: Reported on 08/03/2021)   Completed Course  ? pantoprazole (PROTONIX) 40 MG tablet Take 1 tablet (40 mg total) by mouth 2 (two) times daily. (Patient not taking: Reported on 08/03/2021) 60 tablet 0 Completed Course  ? polyethylene glycol (MIRALAX / GLYCOLAX) 17 g packet Take 17 g by mouth 2 (two) times daily. (Patient not taking: Reported on 08/03/2021) 14 each 0 Completed Course  ? rosuvastatin (CRESTOR) 20 MG tablet Take 1 tablet (20 mg total) by mouth daily. (Patient not taking: Reported on 08/03/2021) 30 tablet 0 Completed Course  ? ? ?Assessment: ?71 years of age male s/p mechanical thrombectomy for DVTs to continue on IV Heparin per consult. Pharmacy to dose.  ? ?Heparin level 0.62, patient now status post thrombectomy with concern for probable pulmonary embolization, no reported bleeding or bruising ? ?Goal of Therapy:  ?Heparin level 0.3-0.7 units/ml ?Monitor platelets by anticoagulation protocol: Yes ?  ?Plan:  ?Continue heparin to 2100 units/hr (21 mL/hr).  ?Recheck heparin level in 6 hours.  ?Daily heparin level and CBC ? ?Georga Bora, PharmD ?Clinical Pharmacist ?08/07/2021 12:59 AM ?Please check AMION for all [place] numbers ? ? ? ? ? ?

## 2021-08-07 NOTE — Progress Notes (Addendum)
? ?Progress Note ? ?Patient Name: Douglas [name] ?Date of Encounter: 08/07/2021 ? ?[facility] Cardiologist: None  ? ?Subjective  ? ?Mild SOB this AM ? ?Inpatient Medications  ?  ?Scheduled Meds: ? vitamin C  1,000 mg Oral Daily  ? aspirin EC  81 mg Oral Daily  ? Chlorhexidine Gluconate Cloth  6 each Topical Q0600  ? clopidogrel  75 mg Oral Q breakfast  ? docusate sodium  100 mg Oral BID  ? finasteride  5 mg Oral Daily  ? gabapentin  300 mg Oral QHS  ? leptospermum manuka honey  1 application. Topical Daily  ? levalbuterol  0.63 mg Nebulization BID  ? melatonin  5 mg Oral QHS  ? methocarbamol  500 mg Oral TID  ? multivitamin with minerals  1 tablet Oral Daily  ? pantoprazole  40 mg Oral Daily  ? polyethylene glycol  17 g Oral BID  ? potassium chloride  20-40 mEq Oral Once  ? rosuvastatin  20 mg Oral Daily  ? sodium chloride flush  3 mL Intravenous Q12H  ? sodium chloride flush  3 mL Intravenous Q12H  ? terazosin  15 mg Oral QHS  ? ?Continuous Infusions: ? sodium chloride Stopped (08/06/21 1655)  ? sodium chloride    ? sodium chloride    ? amiodarone 30 mg/hr (08/07/21 0454)  ? heparin 2,100 Units/hr (08/07/21 0805)  ? lactated ringers 100 mL/hr at 08/07/21 0454  ? ?PRN Meds: ?sodium chloride, sodium chloride, acetaminophen, alum & mag hydroxide-simeth, guaiFENesin-dextromethorphan, hydrALAZINE, HYDROmorphone (DILAUDID) injection, labetalol, metoprolol tartrate, montelukast, ondansetron (ZOFRAN) IV, oxyCODONE, phenol, sodium chloride flush, sodium chloride flush  ? ?Vital Signs  ?  ?Vitals:  ? 08/07/21 0530 08/07/21 0710 08/07/21 0848 08/07/21 0944  ?BP: 100/61 115/76  108/61  ?Pulse: 93  90   ?Resp: '15  18 14  '$ ?Temp: 98.6 ?F (37 ?C)     ?TempSrc: Oral     ?SpO2: 100%  98% 99%  ?Weight:      ?Height:      ? ? ?Intake/Output Summary (Last 24 hours) at 08/07/2021 1015 ?Last data filed at 08/07/2021 0530 ?Gross per 24 hour  ?Intake 2131.06 ml  ?Output 1045 ml  ?Net 1086.06 ml  ? ? ?  08/05/2021  ?  5:00 AM 08/04/2021   ?  2:10 PM 08/03/2021  ?  6:43 PM  ?Last 3 Weights  ?Weight (lbs) 199 lb 1.2 oz 195 lb 12.3 oz 220 lb  ?Weight (kg) 90.3 kg 88.8 kg 99.791 kg  ?   ? ?Telemetry  ?  ?A sensed V paced - Personally Reviewed ? ?ECG  ?  ?N/a - Personally Reviewed ? ?Physical Exam  ? ?GEN: No acute distress.   ?Neck: No JVD ?Cardiac: RRR, no murmurs, rubs, or gallops.  ?Respiratory: Clear to auscultation bilaterally. ?GI: Soft, nontender, non-distended  ?MS: 1+ left leg edema ?Neuro:  Nonfocal  ?Psych: Normal affect  ? ?Labs  ?  ?High Sensitivity Troponin:   ?Recent Labs  ?Lab 08/06/21 ?2002  ?TROPONINIHS 80*  ?   ?Chemistry ?Recent Labs  ?Lab 08/06/21 ?0220 08/06/21 ?2002 08/06/21 ?2303 08/07/21 ?5038  ?NA 128* 128*  --  130*  ?K 4.0 4.2  --  4.4  ?CL 101 104  --  102  ?CO2 20* 19*  --  20*  ?GLUCOSE 170* 198*  --  217*  ?BUN 31* 22  --  18  ?CREATININE 0.59* 0.54*  --  0.60*  ?CALCIUM 8.3* 7.9*  --  8.2*  ?MG  --  1.6* 1.7 1.9  ?GFRNONAA >60 >60  --  >60  ?ANIONGAP 7 5  --  8  ?  ?Lipids  ?Recent Labs  ?Lab 08/06/21 ?2303  ?CHOL 93  ?TRIG 64  ?HDL 40*  ?[place] 40  ?CHOLHDL 2.3  ?  ?Hematology ?Recent Labs  ?Lab 08/06/21 ?2002 08/06/21 ?2303 08/07/21 ?0854  ?WBC 4.9 4.6 4.2  ?RBC 2.90* 2.82* 2.91*  ?HGB 8.3* 8.0* 8.0*  ?HCT 26.1* 25.0* 26.0*  ?MCV 90.0 88.7 89.3  ?MCH 28.6 28.4 27.5  ?MCHC 31.8 32.0 30.8  ?RDW 17.1* 17.0* 17.2*  ?PLT 357 363 371  ? ?Thyroid No results for input(s): TSH, FREET4 in the last 168 hours.  ?BNPNo results for input(s): BNP, PROBNP in the last 168 hours.  ?DDimer No results for input(s): DDIMER in the last 168 hours.  ? ?Radiology  ?  ?CT Angio Chest Pulmonary Embolism (PE) W or WO Contrast ? ?Result Date: 08/05/2021 ?CLINICAL DATA:  Status post left lower extremity angiogram with left SFA stent EXAM: CT ANGIOGRAPHY CHEST WITH CONTRAST TECHNIQUE: Multidetector CT imaging of the chest was performed using the standard protocol during bolus administration of intravenous contrast. Multiplanar CT image  reconstructions and MIPs were obtained to evaluate the vascular anatomy. RADIATION DOSE REDUCTION: This exam was performed according to the departmental dose-optimization program which includes automated exposure control, adjustment of the mA and/or kV according to patient size and/or use of iterative reconstruction technique. CONTRAST:  25m OMNIPAQUE IOHEXOL 350 MG/ML SOLN COMPARISON:  None. FINDINGS: Cardiovascular: Satisfactory opacification of the pulmonary arteries to the segmental level. No evidence of pulmonary embolism. Normal heart size. No pericardial effusion. Thoracic aortic atherosclerosis. Mediastinum/Nodes: No enlarged mediastinal, hilar, or axillary lymph nodes. Thyroid gland, trachea, and esophagus demonstrate no significant findings. Lungs/Pleura: Lungs are clear. No pleural effusion or pneumothorax. Upper Abdomen: Heterogeneous enhancing 4.2 cm masslike area in the spleen of indeterminate etiology. Musculoskeletal: No acute osseous abnormality. No aggressive osseous lesion. 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 Review of the MIP images confirms the above findings. IMPRESSION: 1. No pulmonary embolism. 2. No acute cardiopulmonary findings. 3. Heterogeneous enhancing 4.2 cm masslike area in the spleen of indeterminate etiology. Recommend further evaluation with an MRI of the abdomen without and with intravenous contrast for further characterization. 4. Aortic Atherosclerosis (ICD10-I70.0). Electronically Signed   By: HKathreen DevoidM.D.   On: 08/05/2021 16:17  ? ?PERIPHERAL VASCULAR CATHETERIZATION ? ?Result Date: 08/06/2021 ?DATE OF SERVICE: 08/06/2021  PATIENT:  DReatha Armour 71y.o. 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 4) left lower extremity and central venogram (362mtotal contrast) 5) conscious sedation (66 minutes)  SURGEON:  Surgeon(s) and Role:    * Hawken, ThYevonne AlineMD - Primary  ASSISTANT: none  ANESTHESIA:   local and IV sedation  EBL: 10077mBLOOD ADMINISTERED:none  DRAINS: none  LOCAL MEDICATIONS USED:  LIDOCAINE  SPECIMEN:  none  COUNTS: confirmed correct.  TOURNIQUET:  none  PATIENT DISPOSITION:  PACU - hemodynamically stable.  Delay start of Pharmacological VTE agent (>24hrs) due  to surgical blood loss or risk of bleeding: no  INDICATION FOR PROCEDURE: Djibril Glogowski is a 71 y.o. 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 After careful discussion of risks, benefits, and alternatives the patient was offered mechanical thrombectomy. The patient  understood and wished to proceed.  OPERATIVE FINDINGS: extensive deep venous thrombosis extending from peripheral IVC to popliteal vein on the left. Almost complete resolution after Inari ClotTriever x 8 passes.  DESCRIPTION OF PROCEDURE: After identification of the patient in the pre-operative holding area, the patient was transferred to the operating room. The patient was positioned prone on the operating room table. Anesthesia was induced. The left popliteal fossa was prepped and draped in standard fashion. A surgical pause was performed confirming correct patient, procedure, and operative location.  Using intraoperative ultrasound, the small saphenous vein in the proximal left calf was accessed with micropuncture technique.  Glidewire advantage was navigated into the iliac veins on the left side.  Her access was upsized gradually to 8 [place]. Over the wire, a Berenstein catheter was advanced to help  select the inferior vena cava.  I was not able to access this blindly.  Central venogram was performed.  This showed severe clot burden throughout the iliac bifurcation.  I was able to find a flow channel, and navigated the

## 2021-08-07 NOTE — Progress Notes (Signed)
ANTICOAGULATION CONSULT NOTE ? ?Pharmacy Consult for heparin ?Indication:  acute limb ischemia ? ?No Known Allergies ? ?Patient Measurements: ?Height: 6' (182.9 cm) ?Weight: 90.3 kg (199 lb 1.2 oz) ?IBW/kg (Calculated) : 77.6 ?Heparin Dosing Weight: 97 kg  ? ?Vital Signs: ?Temp: 98.6 ?F (37 ?C) (04/29 0530) ?Temp Source: Oral (04/29 0530) ?BP: 108/61 (04/29 0944) ?Pulse Rate: 90 (04/29 0848) ? ?Labs: ?Recent Labs  ?  08/06/21 ?0220 08/06/21 ?2002 08/06/21 ?2303 08/07/21 ?7902 08/07/21 ?0854  ?HGB 9.0* 8.3* 8.0*  --  8.0*  ?HCT 27.9* 26.1* 25.0*  --  26.0*  ?PLT 398 357 363  --  371  ?HEPARINUNFRC 0.27*  --  0.62 0.64  --   ?CREATININE 0.59* 0.54*  --   --  0.60*  ?TROPONINIHS  --  60*  --   --   --   ? ? ? ?Estimated Creatinine Clearance: 91.6 mL/min (A) (by C-G formula based on SCr of 0.6 mg/dL (L)). ? ? ?Medical History: ?Past Medical History:  ?Diagnosis Date  ? BPH (benign prostatic hyperplasia)   ? CLL (chronic lymphocytic leukemia) ([place])   ? Hypertension   ? Pacemaker   ? Medtronic Device  ? ? ?Medications:  ?Medications Prior to Admission  ?Medication Sig Dispense Refill Last Dose  ? albuterol (PROVENTIL) (2.5 MG/3ML) 0.083% nebulizer solution Take 2.5 mg by nebulization every 6 (six) hours as needed for wheezing or shortness of breath.   08/03/2021  ? albuterol (VENTOLIN HFA) 108 (90 Base) MCG/ACT inhaler Inhale 2 puffs into the lungs 4 (four) times daily. 8.5 g 0 unknown  ? amLODipine (NORVASC) 10 MG tablet Take 1 tablet (10 mg total) by mouth daily. 30 tablet 0 08/02/2021  ? ascorbic acid (VITAMIN C) 1000 MG tablet Take 1 tablet (1,000 mg total) by mouth daily. 30 tablet 0 08/03/2021  ? docusate sodium (COLACE) 100 MG capsule Take 1 capsule (100 mg total) by mouth 2 (two) times daily. (Patient taking differently: Take 100 mg by mouth daily.) 10 capsule 0 08/02/2021  ? finasteride (PROSCAR) 5 MG tablet Take 1 tablet (5 mg total) by mouth daily. 30 tablet 0 08/02/2021  ? gabapentin (NEURONTIN) 300 MG capsule Take  1 capsule (300 mg total) by mouth at bedtime. 30 capsule 0 08/02/2021  ? IMBRUVICA 420 MG tablet TAKE 1 TABLET BY MOUTH ONCE  DAILY WITH A FULL GLASS OF WATER (Patient taking differently: 420 mg daily. WITH A FULL GLASS OF  WATER) 28 tablet 1 08/03/2021 at 1200  ? melatonin 5 MG TABS Take 1 tablet (5 mg total) by mouth at bedtime. 30 tablet 0 08/02/2021  ? methocarbamol (ROBAXIN) 500 MG tablet Take 1 tablet (500 mg total) by mouth 3 (three) times daily. (Patient taking differently: Take 500 mg by mouth every 8 (eight) hours as needed for muscle spasms.) 90 tablet 0 Past Week  ? oxyCODONE (OXY IR/ROXICODONE) 5 MG immediate release tablet Take 1 tablet (5 mg total) by mouth every 4 (four) hours as needed for moderate pain. 30 tablet 0 Past Week  ? oxymetazoline (AFRIN) 0.05 % nasal spray Place 1 spray into both nostrils 2 (two) times daily as needed for congestion.   unknown  ? terazosin (HYTRIN) 5 MG capsule Take 3 capsules (15 mg total) by mouth at bedtime. (Patient taking differently: Take 15 mg by mouth in the morning.) 90 capsule 0 08/03/2021  ? valsartan-hydrochlorothiazide (DIOVAN-HCT) 320-25 MG tablet Take 1 tablet by mouth daily.   08/03/2021  ? aspirin EC 81 MG  EC tablet Take 1 tablet (81 mg total) by mouth daily. Swallow whole. (Patient not taking: Reported on 08/03/2021) 30 tablet 11 Completed Course  ? irbesartan (AVAPRO) 300 MG tablet Take 1 tablet (300 mg total) by mouth daily. (Patient not taking: Reported on 08/03/2021) 30 tablet 0 Completed Course  ? metoprolol tartrate (LOPRESSOR) 25 MG tablet Take 1 tablet (25 mg total) by mouth every 8 (eight) hours. (Patient not taking: Reported on 08/03/2021) 90 tablet 0 Completed Course  ? montelukast (SINGULAIR) 10 MG tablet Take 1 tablet (10 mg total) by mouth daily as needed (allergies). (Patient not taking: Reported on 08/03/2021) 10 tablet 0 Completed Course  ? Multiple Vitamin (MULTIVITAMIN WITH MINERALS) TABS tablet Take 1 tablet by mouth daily. (Patient not  taking: Reported on 08/03/2021)   Completed Course  ? pantoprazole (PROTONIX) 40 MG tablet Take 1 tablet (40 mg total) by mouth 2 (two) times daily. (Patient not taking: Reported on 08/03/2021) 60 tablet 0 Completed Course  ? polyethylene glycol (MIRALAX / GLYCOLAX) 17 g packet Take 17 g by mouth 2 (two) times daily. (Patient not taking: Reported on 08/03/2021) 14 each 0 Completed Course  ? rosuvastatin (CRESTOR) 20 MG tablet Take 1 tablet (20 mg total) by mouth daily. (Patient not taking: Reported on 08/03/2021) 30 tablet 0 Completed Course  ? ? ?Assessment: ?71 years of age male s/p mechanical thrombectomy for DVTs to continue on IV Heparin per consult. Pharmacy to dose.  ? ?Heparin level 0.60, therapeutic ?Current heparin infusion rate: 2100 units/hr ?Hgb/Hct/Plt stable ?No s/sx of bleeding noted ? ?Goal of Therapy:  ?Heparin level 0.3-0.7 units/ml ?Monitor platelets by anticoagulation protocol: Yes ?  ?Plan:  ?Continue heparin to 2100 units/hr, therapeutic rate ?Daily heparin level and CBC ?Continue to monitor for s/sx of bleeding ? ? ?Luisa Hart, PharmD, BCPS ?Clinical Pharmacist ?08/07/2021 10:59 AM  ? ?Please refer to [facility] for pharmacy phone number  ?

## 2021-08-07 NOTE — Progress Notes (Signed)
Physical Therapy Treatment ?Patient Details ?Name: Douglas [name] ?MRN: [id number] ?DOB: 06-06-1950 ?Today's Date: 08/07/2021 ? ? ?History of Present Illness 71 y.o. male who presented 08/03/21 with worsening L foot pain and edema. Pt with age indeterminate bil lower extremity DVTs. S/p abdominal aortogram with L superficial femoral artery stent 4/26. Hospital stay complicated by hypotension and atrial tachycardia or atrial flutter episode. S/p mechanical thrombectomy of inferior vena cava, left iliac veins, left femoral veins, left popliteal vein 4/28. 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 PMH: pacemaker, DM, BPH, HTN, and CLL. ? ?  ?PT Comments  ? ? Pt declining to sit EOB or get OOB today secondary to apparent anxiety in regards to tachycardic episode last night and pt now feeling difficulty with breathing. Pt agreeable to bed level exercises though, thus progressed core strengthening through lowering the HOB to 24 degrees to perform modified sit-ups. Also, performed sidelying R hip extension AAROM to reduce risk of contracture and improve hip ROM in preparation for pt's desire to eventually get a prosthesis. Pt did roll 10x each direction with min guard using the bed rails. Pt agreeable to progressing to EOB next session. Pt continues to report his wife can manage him safely and he does not want to go to a rehab facility but rather home at d/c. Thus, current recommendations remain appropriate. Will continue to follow acutely. ? ?   ?Recommendations for follow up therapy are one component of a multi-disciplinary discharge planning process, led by the attending physician.  Recommendations may be updated based on patient status, additional functional criteria and insurance authorization. ? ?Follow Up Recommendations ? Home health PT ?  ?  ?Assistance Recommended at Discharge Frequent or constant Supervision/Assistance  ?Patient can return home  with the following Assistance with cooking/housework;Assist for transportation;Help with stairs or ramp for entrance;A lot of help with bathing/dressing/bathroom;A lot of help with walking and/or transfers ?  ?Equipment Recommendations ? None recommended by PT  ?  ?Recommendations for Other Services   ? ? ?  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  ? ?Mobility ? Bed Mobility ?Overal bed mobility: Needs Assistance ?Bed Mobility: Rolling, Supine to Sit ?Rolling: Min guard ?  ?Supine to sit: Supervision ?  ?  ?General bed mobility comments: Min-modA to transition pt superior in bed with bed tilted and cues for pt to push through L leg. x10 rolls each direction, cuing pt to look towards rail he will pull on and either bring R leg across body to roll to L or push through L foot on bed to roll to R, min guard for safety. Supervision for pt to pull up to long sitting in bed with HOB elevated to 24 degrees, >10 reps total. Pt declined to get EOB this date. ?  ? ?Transfers ?  ?  ?  ?  ?  ?  ?  ?  ?  ?General transfer comment: pt politely declined sitting EOB or OOB mobility today ?  ? ?Ambulation/Gait ?  ?  ?  ?  ?  ?  ?  ?General Gait Details: unable ? ? ?Stairs ?  ?  ?  ?  ?  ? ? ?Wheelchair Mobility ?  ? ?  Modified Rankin (Stroke Patients Only) ?  ? ? ?  ?Balance Overall balance assessment: Needs assistance ?Sitting-balance support: Bilateral upper extremity supported, Feet supported ?Sitting balance-Leahy Scale: Poor ?Sitting balance - Comments: Pt able to lift trunk off elevated HOB pulling on bil bed rails. ?  ?  ?  ?  ?  ?  ?  ?  ?  ?  ?  ?  ?  ?  ?  ?  ? ?  ?Cognition Arousal/Alertness: Awake/alert ?Behavior During Therapy: [facility] for tasks assessed/performed,  Anxious ?Overall Cognitive Status: Within Functional Limits for tasks assessed ?  ?  ?  ?  ?  ?  ?  ?  ?  ?  ?  ?  ?  ?  ?  ?  ?General Comments: Pt pleasant, agreeable to PT session and [facility] cognitively. A little anxious in regards to mobility secondary to HR and BP complications recently. ?  ?  ? ?  ?Exercises General Exercises - Lower Extremity ?Ankle Circles/Pumps: AROM, 10 reps, Supine, Left ?Hip ABduction/ADduction: AROM, 10 reps, Supine, Both ?Straight Leg Raises: AROM, Strengthening, 10 reps, Supine, Both ?Amputee Exercises ?Hip Extension: Right, 10 reps, Sidelying, AAROM ?Other Exercises ?Other Exercises: Modified sit ups pulling self up to long sitting in bed with bil hands on bed rails with HOB at 24 degrees elevation, >10x total ? ?  ?General Comments General comments (skin integrity, edema, etc.): HR and SpO2 stable on 1L O2 throughout ?  ?  ? ?Pertinent Vitals/Pain Pain Assessment ?Pain Assessment: Faces ?Faces Pain Scale: Hurts a little bit ?Pain Location: grimacing with mobility ?Pain Descriptors / Indicators: Grimacing ?Pain Intervention(s): Limited activity within patient's tolerance, Monitored during session, Repositioned  ? ? ?Home Living   ?  ?  ?  ?  ?  ?  ?  ?  ?  ?   ?  ?Prior Function    ?  ?  ?   ? ?PT Goals (current goals can now be found in the care plan section) Acute Rehab PT Goals ?Patient Stated Goal: to improve and go home, not to rehab ?PT Goal Formulation: With patient ?Time For Goal Achievement: 08/19/21 ?Potential to Achieve Goals: Fair ?Progress towards PT goals: Progressing toward goals ? ?  ?Frequency ? ? ? Min 3X/week ? ? ? ?  ?PT Plan Current plan remains appropriate  ? ? ?Co-evaluation   ?  ?  ?  ?  ? ?  ?AM-PAC PT "6 Clicks" Mobility   ?Outcome Measure ? Help needed turning from your back to your side while in a flat bed without using bedrails?: A Little ?Help needed moving from lying on your back to sitting on the side of a flat bed without using bedrails?: A Lot ?Help  needed moving to and from a bed to a chair (including a wheelchair)?: Total ?Help needed standing up from a chair using your arms (e.g., wheelchair or bedside chair)?: Total ?Help needed to walk in hospital room?: Total ?Help needed climbing 3-5 steps with a railing? : Total ?6 Click Score: 9 ? ?  ?End of Session   ?Activity Tolerance: Patient tolerated treatment well ?Patient left: in bed;with call bell/phone within reach;with bed alarm set ?Nurse Communication: Mobility status ?PT Visit Diagnosis: Muscle weakness (generalized) (M62.81) ?Pain - Right/Left: Right ?Pain - part of body: Leg ?  ? ? ?Time: 9735-3299 ?PT Time Calculation (min) (ACUTE ONLY): 28 min ? ?Charges:  $Therapeutic Exercise: 8-22 mins ?$Therapeutic Activity: 8-22 mins          ?          ? ?  Moishe Spice, PT, DPT ?Acute Rehabilitation Services  ?Pager: [phone] ?Office: [phone] ? ? ? ?Maretta Bees Pettis ?08/07/2021, 11:25 AM ? ?

## 2021-08-07 NOTE — Progress Notes (Signed)
?  Echocardiogram ?2D Echocardiogram has been performed. ? ?Elmer Ramp ?08/07/2021, 10:21 AM ?

## 2021-08-08 ENCOUNTER — Inpatient Hospital Stay (HOSPITAL_COMMUNITY): Payer: 59

## 2021-08-08 DIAGNOSIS — I739 Peripheral vascular disease, unspecified: Secondary | ICD-10-CM

## 2021-08-08 DIAGNOSIS — I2609 Other pulmonary embolism with acute cor pulmonale: Secondary | ICD-10-CM | POA: Diagnosis not present

## 2021-08-08 DIAGNOSIS — I471 Supraventricular tachycardia: Secondary | ICD-10-CM

## 2021-08-08 DIAGNOSIS — L8990 Pressure ulcer of unspecified site, unspecified stage: Secondary | ICD-10-CM | POA: Insufficient documentation

## 2021-08-08 DIAGNOSIS — Z89611 Acquired absence of right leg above knee: Secondary | ICD-10-CM

## 2021-08-08 LAB — BASIC METABOLIC PANEL
Anion gap: 5 (ref 5–15)
BUN: 12 mg/dL (ref 8–23)
CO2: 21 mmol/L — ABNORMAL LOW (ref 22–32)
Calcium: 7.9 mg/dL — ABNORMAL LOW (ref 8.9–10.3)
Chloride: 103 mmol/L (ref 98–111)
Creatinine, Ser: 0.57 mg/dL — ABNORMAL LOW (ref 0.61–1.24)
GFR, Estimated: >60 mL/min (ref >60)
Glucose, Bld: 203 mg/dL — ABNORMAL HIGH (ref 70–99)
Potassium: 4.1 mmol/L (ref 3.5–5.1)
Sodium: 129 mmol/L — ABNORMAL LOW (ref 135–145)

## 2021-08-08 LAB — CBC
HCT: 25 % — ABNORMAL LOW (ref 39.0–52.0)
Hemoglobin: 7.7 g/dL — ABNORMAL LOW (ref 13.0–17.0)
MCH: 27.3 pg (ref 26.0–34.0)
MCHC: 30.8 g/dL (ref 30.0–36.0)
MCV: 88.7 fL (ref 80.0–100.0)
Platelets: 373 10*3/uL (ref 150–400)
RBC: 2.82 MIL/uL — ABNORMAL LOW (ref 4.22–5.81)
RDW: 17.3 % — ABNORMAL HIGH (ref 11.5–15.5)
WBC: 4.4 10*3/uL (ref 4.0–10.5)
nRBC: 0 % (ref 0.0–0.2)

## 2021-08-08 LAB — HEPARIN LEVEL (UNFRACTIONATED)
Heparin Unfractionated: 1.02 IU/mL — ABNORMAL HIGH (ref 0.30–0.70)
Heparin Unfractionated: 0.87 IU/mL — ABNORMAL HIGH (ref 0.30–0.70)
Heparin Unfractionated: 0.48 IU/mL (ref 0.30–0.70)

## 2021-08-08 LAB — LACTIC ACID, PLASMA: Lactic Acid, Venous: 1.9 mmol/L (ref 0.5–1.9)

## 2021-08-08 LAB — MAGNESIUM: Magnesium: 1.7 mg/dL (ref 1.7–2.4)

## 2021-08-08 MED ORDER — IOHEXOL 350 MG/ML SOLN
100.0000 mL | Freq: Once | INTRAVENOUS | Status: AC | PRN
  Administered 2021-08-08: 100 mL via INTRAVENOUS

## 2021-08-08 MED ORDER — AMIODARONE HCL 200 MG PO TABS
400.0000 mg | ORAL_TABLET | Freq: Two times a day (BID) | ORAL | Status: DC
  Administered 2021-08-08 – 2021-08-10 (×5): 400 mg via ORAL
  Filled 2021-08-08 (×5): qty 2

## 2021-08-08 MED FILL — Heparin Sodium (Porcine) Inj 1000 Unit/ML: INTRAMUSCULAR | Qty: 10 | Status: AC

## 2021-08-08 NOTE — Progress Notes (Addendum)
[facility] ? ?Subjective  - States his SOB has improved. ? ? ?Objective ?109/71 ?93 ?98.1 ?F (36.7 ?C) (Oral) ?14 ?98% ? ?Intake/Output Summary (Last 24 hours) at 08/08/2021 0751 ?Last data filed at 08/08/2021 0400 ?Gross per 24 hour  ?Intake 1265.48 ml  ?Output 2400 ml  ?Net -1134.52 ml  ? ? ?Lungs non labored breathing with SAT of 95% on RA.  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 ? ?Assessment/Planning: ?Right LE ischemia s/p failed Fem-pop bypass with vein.  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 ? ?He developed left LE edema and found to have DVT.  S/P  mechanical thrombectomy of inferior vena cava, left iliac veins, left femoral veins, left popliteal vein. ? ?Tachycardia with reports of SOB.  He is on a paced rhythm with history of Afib ?Pending CTA chest for PE work up. ? ?Echo: ?FINDINGS  ? Left Ventricle: Septal flattening consistent with cor pulmonale. Left  ?ventricular ejection fraction, by estimation, is 45 to 50%. The left  ?ventricle has mildly decreased function. The left ventricle has no  ?regional wall motion abnormalities. The left  ?ventricular internal cavity size was normal in size. There is mild left  ?ventricular hypertrophy. Left ventricular diastolic parameters are  ?indeterminate.  ? ?Right Ventricle: RV dysfunctoin and dilatation with evidence cor pulmonale  ?new since 01/17/21 likely secondary to PE. The right ventricular size is  ?severely enlarged. No increase in right ventricular wall thickness. Right  ?ventricular systolic function is  ?severely reduced. There is moderately elevated pulmonary artery systolic  ?pressure. The tricuspid regurgitant velocity is 2.82 m/s, and with an  ?assumed right  atrial pressure of 15 mmHg, the estimated right ventricular  ?systolic pressure is 75.1 mmHg.  ? ?Left Atrium: Left atrial size was normal in size.  ? ?Right Atrium: Right atrial size was normal in size.  ? ?Pericardium: There is no evidence of pericardial effusion.  ? ?Mitral Valve: The mitral valve is abnormal. There is mild thickening of  ?the mitral valve leaflet(s). There is mild calcification of the mitral  ?valve leaflet(s). Mild mitral annular calcification. No evidence of mitral  ?valve regurgitation. No evidence of  ?mitral valve stenosis.  ? ?Tricuspid Valve: The tricuspid valve is normal in structure. Tricuspid  ?valve regurgitation is mild . No evidence of tricuspid stenosis.  ? ?Aortic Valve: The aortic valve is tricuspid. There is mild calcification  ?of the aortic valve. Aortic valve regurgitation is not visualized. Aortic  ?valve sclerosis is present, with no evidence of aortic valve stenosis.  ? ?Pulmonic Valve: The pulmonic valve was normal in structure. Pulmonic valve  ?regurgitation is not visualized. No evidence of pulmonic stenosis.  ? ?Aorta: The aortic root is normal in size and structure.  ? ?Venous: The inferior vena cava is dilated in size with greater than 50%  ?respiratory variability, suggesting right atrial pressure of 8 mmHg.  ? ?IAS/Shunts: No atrial level shunt detected by color flow Doppler.  ? ?HGB 7.7 no evidence of active bleeding, no bloody BM reported.  Asymptomatic will observe. ? ? ? ?Douglas [name] ?08/08/2021 ?7:51 AM ?-- ? ?VASCULAR STAFF ADDENDUM: ?I have independently interviewed and examined the patient. ?I agree with the above.  ?  Patient asymptomatic this morning. ?Severe RV dysfunction, dilation on echo yesterday ?I have consulted my critical care colleagues for further direction regarding PE work-up to intervention versus medical management. ? ?Heart rate improved this morning, appreciate cardiology recs regarding medication changes. ? ?J. Melene Muller,  MD ?[facility] ?Office Phone Number: [phone] ?08/08/2021 8:13 AM ? ? ? ?Laboratory ?Lab Results: ?Recent Labs  ?  08/07/21 ?7412 08/08/21 ?0310  ?WBC 4.2 4.4  ?HGB 8.0* 7.7*  ?HCT 26.0* 25.0*  ?PLT 371 373  ? ?BMET ?Recent Labs  ?  08/07/21 ?0854 08/08/21 ?0310  ?NA 130* 129*  ?K 4.4 4.1  ?CL 102 103  ?CO2 20* 21*  ?GLUCOSE 217* 203*  ?BUN 18 12  ?CREATININE 0.60* 0.57*  ?CALCIUM 8.2* 7.9*  ? ? ?COAG ?Lab Results  ?Component Value Date  ? INR 1.1 08/04/2021  ? INR 1.0 08/03/2021  ? INR 1.0 06/18/2021  ? ?No results found for: PTT ? ? ? ?

## 2021-08-08 NOTE — Progress Notes (Signed)
ANTICOAGULATION CONSULT NOTE - Follow Up Consult ? ?Pharmacy Consult for Heparin infusion ?Indication:  VTE ? ?No Known Allergies ? ?Patient Measurements: ?Height: 6' (182.9 cm) ?Weight: 90.3 kg (199 lb 1.2 oz) ?IBW/kg (Calculated) : 77.6 ?Heparin Dosing Weight: 88.8 kg ? ?Vital Signs: ?Temp: 98.1 ?F (36.7 ?C) (04/30 1900) ?Temp Source: Oral (04/30 1900) ?BP: 103/65 (04/30 2000) ?Pulse Rate: 106 (04/30 2000) ? ?Labs: ?Recent Labs  ?  08/06/21 ?2002 08/06/21 ?2303 08/07/21 ?7846 08/07/21 ?0854 08/08/21 ?0310 08/08/21 ?9629 08/08/21 ?2002  ?HGB 8.3* 8.0*  --  8.0* 7.7*  --   --   ?HCT 26.1* 25.0*  --  26.0* 25.0*  --   --   ?PLT 357 363  --  371 373  --   --   ?HEPARINUNFRC  --  0.62   < >  --  1.02* 0.87* 0.48  ?CREATININE 0.54*  --   --  0.60* 0.57*  --   --   ?TROPONINIHS 80*  --   --   --   --   --   --   ? < > = values in this interval not displayed.  ? ? ? ?Estimated Creatinine Clearance: 91.6 mL/min (A) (by C-G formula based on SCr of 0.57 mg/dL (L)). ? ?Assessment: ?71 years of age male s/p mechanical thrombectomy for DVTs to continue on IV Heparin per consult. Pharmacy to dose.  ? ?HL came back therapeutic this PM. We will continue with the same rate and check level in AM.  ? ?Goal of Therapy:  ?Heparin level 0.3-0.7 units/ml ?Monitor platelets by anticoagulation protocol: Yes ?  ?Plan:  ?Cont heparin 1750 units/hr  ?Monitor heparin level and CBC daily ?Continue to monitor for s/sx of bleeding ? ?Onnie Boer, PharmD, BCIDP, AAHIVP, CPP ?Infectious Disease Pharmacist ?08/08/2021 9:03 PM ? ? ? ?

## 2021-08-08 NOTE — Assessment & Plan Note (Signed)
Likely as a consequence of mechanical IVC thrombectomy performed Friday as CTPE from 4/27 negative with normal RV/LV ratio.  ?I have reviewed echo which does show signs of acute cor pulmonale but still preserved LV filling and function.  ?CT PE today shows clot bilaterally in the segmental and subsegmental vessels with flow reconstitution around the clot.  ?He developed acute dyspnea and rapid Afib. Symptoms settled on amiodarone.  ? ?He feels better than yesterday and is on room air, BP 110/70  ?JVP is not elevated and there are no signs of RV strain. Chest is clear.  ? ?- Given that he is now improving and close to 48h out from the event I would suggest that we continue to manage him with heparinization alone and reserve IR intervention in the unlikely event that he begins to deteriorate. ?- If he continues to do well, can transition to [place] in 24h, and he could be discharged from a PE standpoint.  ?- The echo may take several days to normalize and he should have a repeat echocardiogram in 4 weeks and follow up with PCCM thereafter.  ?

## 2021-08-08 NOTE — Consult Note (Signed)
? ?NAMEJoban [name], MRN:  [id number], DOB:  02-16-1951, LOS: 5 ?ADMISSION DATE:  08/03/2021, CONSULTATION DATE:  08/08/21 ?REFERRING MD:  Unk Lightning -- [facility] , CHIEF COMPLAINT:  Cor pulmonale   ? ?History of Present Illness:  ? ?71 yo M PMH CLL, HTN, BPH, PAD s/p R AKA ad,otted tp [facility] 4/25 with LLE edema. Found to have L DVT involving popliteal, fliac, fem, and ivc. Went for thrombectomy 4/28. Post op had acute SOB with mild hypoxia  and SVT, and was presumed to have a PE. Continued on heparin. Seen by cardiology and started on heparin.  ?ECHO obtained 4/29 which showed remarkable RV failure -- presumed PE with cor pulmonale.  ? ?SOB however has improved and he is now 93-97% on RA. Denies chest pain. Endorses significant DOE (sitting up in bed, PT in bed) which is new since thrombectomy. HR is mid 90s- low 100s, on amio gtt.  ? ?PCCM consulted in this setting for PE evaluation  ? ? ?Pertinent  Medical History  ?PAD ?Bradycardia, now s/p pacemaker  ? ?Significant Hospital Events: ?Including procedures, antibiotic start and stop dates in addition to other pertinent events   ?4/25 admitted LLE DVT and fem artery occlusion ?4/26 L fem stent ?4/27 hypotensive -- CTA without PE. Cards consulted. Antihypertensives held  ?4/28 mechanical thrombectomy IVC, L iliac, fem, pop vein. Acute SOB, hypoxia SVT after procedure, presumed PE  ?4/29 mild SOB, improving hypoxia. Amio. ECHO with significant RV failure ?4/30 CCM consulted for evaluation of presumed PE with acute cor pulmonale ? ?Interim History / Subjective:  ?On RA ?HR 90-100  on amio  ? ?Objective   ?Blood pressure 112/63, pulse 97, temperature 98.1 ?F (36.7 ?C), temperature source Oral, resp. rate 20, height 6' (1.829 m), weight 90.3 kg, SpO2 97 %. ?   ?   ? ?Intake/Output Summary (Last 24 hours) at 08/08/2021 0816 ?Last data filed at 08/08/2021 0400 ?Gross per 24 hour  ?Intake 1025.48 ml  ?Output 2400 ml  ?Net -1374.52 ml  ? ?Filed Weights  ? 08/03/21 1843 08/04/21 1410  08/05/21 0500  ?Weight: 99.8 kg 88.8 kg 90.3 kg  ? ? ?Examination: ?General: Chronically ill pleasant older adult M reclined in bed NAD  ?HENT: NCAT pink mm anicteric sclera ?Lungs: CTAb even unlabored on RA. Mild Tachypnea with movement  ?Cardiovascular: regular but slightly tachycardic, a sensed v paced rhythm  ?Abdomen: soft ndnt + bowel sounds  ?Extremities: R Aka.  ?Neuro: AAOx3 following commands  ?GU: scrotal edema ? ?Resolved Hospital Problem list   ? ? ?Assessment & Plan:  ? ?Acute right heart failure ?Presumed PE with cor pulmonale ?-in setting of thrombectomy below 4/28 sounds like sx improving, but marked RV failure on echo. Still with sx DOE. On amio which could mask some tachycardia. But by and large, improved sx since onset 4/28 ?P ?-CTA chest ?-cont hep gtt  ?-consideration of possible further intervention pending CTA --  does have marked RV failure, initial sx improving but still with some sx and there could potentially be role for intervetion--  but could argue for continuing current tx course of hep gtt but would be helpful to have CTA regardless to understand what we are dealing with (if amenable to intervention in case of decompensation).  ? ?LLE DVT, s/p thrombectomy IVC, iliac, fem, popliteal vein ?L fem artery occlusion, s/p stent  ?RLE ischemia, s/p AKA  ?P ?-cont hep gtt ?-per [facility]VS  ? ?NSVT  / SVT  ?-amio  ?-cards  following  ? ?Splenic mass  ?-further imaging when appropriate -- likely outpt  ? ?Best Practice (right click and "Reselect all SmartList Selections" daily)  ? ?Diet/type: NPO ?DVT prophylaxis: systemic heparin ?GI prophylaxis: PPI ?Lines: N/A ?Foley:  N/A ?Code Status:  full code ?Last date of multidisciplinary goals of care discussion [--] ? ?Labs   ?CBC: ?Recent Labs  ?Lab 08/03/21 ?1911 08/03/21 ?1919 08/06/21 ?0220 08/06/21 ?2002 08/06/21 ?2303 08/07/21 ?6195 08/08/21 ?0310  ?WBC 8.1   < > 6.0 4.9 4.6 4.2 4.4  ?NEUTROABS 4.6  --   --  4.4  --   --   --   ?HGB 10.9*   < > 9.0*  8.3* 8.0* 8.0* 7.7*  ?HCT 34.4*   < > 27.9* 26.1* 25.0* 26.0* 25.0*  ?MCV 87.1   < > 88.0 90.0 88.7 89.3 88.7  ?PLT 361   < > 398 357 363 371 373  ? < > = values in this interval not displayed.  ? ? ?Basic Metabolic Panel: ?Recent Labs  ?Lab 08/05/21 ?0703 08/06/21 ?0220 08/06/21 ?2002 08/06/21 ?2303 08/07/21 ?0932 08/08/21 ?0310  ?NA 128* 128* 128*  --  130* 129*  ?K 4.2 4.0 4.2  --  4.4 4.1  ?CL 99 101 104  --  102 103  ?CO2 21* 20* 19*  --  20* 21*  ?GLUCOSE 129* 170* 198*  --  217* 203*  ?BUN 34* 31* 22  --  18 12  ?CREATININE 0.76 0.59* 0.54*  --  0.60* 0.57*  ?CALCIUM 8.5* 8.3* 7.9*  --  8.2* 7.9*  ?MG 2.0  --  1.6* 1.7 1.9 1.7  ? ?GFR: ?Estimated Creatinine Clearance: 91.6 mL/min (A) (by C-G formula based on SCr of 0.57 mg/dL (L)). ?Recent Labs  ?Lab 08/06/21 ?2002 08/06/21 ?2303 08/07/21 ?6712 08/08/21 ?0310  ?WBC 4.9 4.6 4.2 4.4  ? ? ?Liver Function Tests: ?No results for input(s): AST, ALT, ALKPHOS, BILITOT, PROT, ALBUMIN in the last 168 hours. ?No results for input(s): LIPASE, AMYLASE in the last 168 hours. ?No results for input(s): AMMONIA in the last 168 hours. ? ?ABG ?   ?Component Value Date/Time  ? PHART 7.473 (H) 06/22/2021 1230  ? PCO2ART 42.6 06/22/2021 1230  ? PO2ART 89 06/22/2021 1230  ? HCO3 31.2 (H) 06/22/2021 1230  ? TCO2 21 (L) 08/03/2021 1919  ? O2SAT 97 06/22/2021 1230  ?  ? ?Coagulation Profile: ?Recent Labs  ?Lab 08/03/21 ?1911 08/04/21 ?0327  ?INR 1.0 1.1  ? ? ?Cardiac Enzymes: ?No results for input(s): CKTOTAL, CKMB, CKMBINDEX, TROPONINI in the last 168 hours. ? ?HbA1C: ?Hgb A1c MFr Bld  ?Date/Time Value Ref Range Status  ?08/05/2021 07:03 AM 6.1 (H) 4.8 - 5.6 % Final  ?  Comment:  ?  (NOTE) ?Pre diabetes:          5.7%-6.4% ? ?Diabetes:              >6.4% ? ?Glycemic control for   <7.0% ?adults with diabetes ?  ?01/16/2021 06:10 PM 6.7 (H) 4.8 - 5.6 % Final  ?  Comment:  ?  (NOTE) ?Pre diabetes:          5.7%-6.4% ? ?Diabetes:              >6.4% ? ?Glycemic control for   <7.0% ?adults  with diabetes ?  ? ? ?CBG: ?No results for input(s): GLUCAP in the last 168 hours. ? ?Review of Systems:   ?Review of Systems  ?Constitutional: Negative.   ?  HENT: Negative.    ?Eyes: Negative.   ?Respiratory:  Positive for shortness of breath.   ?Cardiovascular:  Positive for leg swelling.  ?Gastrointestinal: Negative.   ?Genitourinary: Negative.   ?Musculoskeletal: Negative.   ?Skin: Negative.   ?Neurological: Negative.   ?Endo/Heme/Allergies: Negative.   ?Psychiatric/Behavioral: Negative.    ? ? ?Past Medical History:  ?He,  has a past medical history of BPH (benign prostatic hyperplasia), CLL (chronic lymphocytic leukemia) ([place]), Hypertension, and Pacemaker.  ? ?Surgical History:  ? ?Past Surgical History:  ?Procedure Laterality Date  ? ABDOMINAL AORTOGRAM W/LOWER EXTREMITY Bilateral 06/21/2021  ? Procedure: ABDOMINAL AORTOGRAM W/LOWER EXTREMITY;  Surgeon: Waynetta Sandy, MD;  Location: [facility];  Service: Cardiovascular;  Laterality: Bilateral;  ? ABDOMINAL AORTOGRAM W/LOWER EXTREMITY N/A 08/04/2021  ? Procedure: ABDOMINAL AORTOGRAM W/LOWER EXTREMITY;  Surgeon: Serafina Mitchell, MD;  Location: [facility];  Service: Cardiovascular;  Laterality: N/A;  ? AMPUTATION Right 06/25/2021  ? Procedure: RIGHT BELOW KNEE AMPUTATION;  Surgeon: Waynetta Sandy, MD;  Location: [facility];  Service: Vascular;  Laterality: Right;  ? AMPUTATION Right 07/19/2021  ? Procedure: AMPUTATION ABOVE KNEE;  Surgeon: Angelia Mould, MD;  Location: [facility];  Service: Vascular;  Laterality: Right;  ? FEMORAL-POPLITEAL BYPASS GRAFT Right 06/22/2021  ? Procedure: RIGHT FEMORAL-POPLITEAL BYPASS WITH VEIN, RIGHT POPLITEAL THROMBECTOMY;  Surgeon: Waynetta Sandy, MD;  Location: [facility];  Service: Vascular;  Laterality: Right;  ? LEFT HEART CATH AND CORONARY ANGIOGRAPHY N/A 01/18/2021  ? Procedure: LEFT HEART CATH AND CORONARY ANGIOGRAPHY;  Surgeon: Burnell Blanks, MD;  Location: [facility];  Service: Cardiovascular;  Laterality: N/A;  ? PACEMAKER IMPLANT N/A 01/19/2021  ? Procedure: PACEMAKER IMPLANT;  Surgeon: Evans Lance, MD;  Location: [facility];  Service: Cardiovascu

## 2021-08-08 NOTE — Progress Notes (Signed)
ANTICOAGULATION CONSULT NOTE - Follow Up Consult ? ?Pharmacy Consult for Heparin infusion ?Indication:  VTE ? ?No Known Allergies ? ?Patient Measurements: ?Height: 6' (182.9 cm) ?Weight: 90.3 kg (199 lb 1.2 oz) ?IBW/kg (Calculated) : 77.6 ?Heparin Dosing Weight: 88.8 kg ? ?Vital Signs: ?Temp: 98.1 ?F (36.7 ?C) (04/30 0800) ?Temp Source: Oral (04/30 0800) ?BP: 114/67 (04/30 0626) ?Pulse Rate: 98 (04/30 0936) ? ?Labs: ?Recent Labs  ?  08/06/21 ?2002 08/06/21 ?2303 08/07/21 ?0833 08/07/21 ?0854 08/08/21 ?0310 08/08/21 ?9485  ?HGB 8.3* 8.0*  --  8.0* 7.7*  --   ?HCT 26.1* 25.0*  --  26.0* 25.0*  --   ?PLT 357 363  --  371 373  --   ?HEPARINUNFRC  --  0.62 0.64  --  1.02* 0.87*  ?CREATININE 0.54*  --   --  0.60* 0.57*  --   ?TROPONINIHS 80*  --   --   --   --   --   ? ? ?Estimated Creatinine Clearance: 91.6 mL/min (A) (by C-G formula based on SCr of 0.57 mg/dL (L)). ? ?Assessment: ?71 years of age male s/p mechanical thrombectomy for DVTs to continue on IV Heparin per consult. Pharmacy to dose.  ? ?Heparin level 0.87, supratherapeutic ?Current heparin infusion rate: 1900 units/hr ?Hgb trending down gradually, Plt stable ?No s/sx of bleeding ? ?Goal of Therapy:  ?Heparin level 0.3-0.7 units/ml ?Monitor platelets by anticoagulation protocol: Yes ?  ?Plan:  ?Decrease heparin infusion rate to 1750 units/hr given supratherapeutic aPTT ?Recheck heparin level in 8 hours ?Monitor heparin level and CBC daily ?Continue to monitor for s/sx of bleeding ? ?Kaleen Mask ?08/08/2021,11:27 AM ? ?

## 2021-08-08 NOTE — Assessment & Plan Note (Addendum)
Well rate controlled at this time with a paced rhythm ? ?- Management deferred to cardiology.  ?

## 2021-08-08 NOTE — Progress Notes (Signed)
ANTICOAGULATION CONSULT NOTE - Follow Up Consult ? ?Pharmacy Consult for heparin ?Indication:  VTE ? ?Labs: ?Recent Labs  ?  08/06/21 ?2002 08/06/21 ?2303 08/07/21 ?6256 08/07/21 ?0854 08/08/21 ?0310  ?HGB 8.3* 8.0*  --  8.0* 7.7*  ?HCT 26.1* 25.0*  --  26.0* 25.0*  ?PLT 357 363  --  371 373  ?HEPARINUNFRC  --  0.62 0.64  --  1.02*  ?CREATININE 0.54*  --   --  0.60* 0.57*  ?TROPONINIHS 80*  --   --   --   --   ? ? ?Assessment: ?71yo male now supratherapeutic on heparin after two levels at upper end of goal; no infusion issues or signs of bleeding per RN. ? ?Goal of Therapy:  ?Heparin level 0.3-0.7 units/ml ?  ?Plan:  ?Will decrease heparin infusion by 2 units/kg/hr to 1900 units/hr and check level in 6 hours.   ? ?Wynona Neat, PharmD, BCPS  ?08/08/2021,4:28 AM ? ? ?

## 2021-08-08 NOTE — Progress Notes (Signed)
? ?Progress Note ? ?Patient Name: Douglas [name] ?Date of Encounter: 08/08/2021 ? ?[facility] Cardiologist: Douglas [name] ? ?Subjective  ? ?No complaints ? ?Inpatient Medications  ?  ?Scheduled Meds: ? vitamin C  1,000 mg Oral Daily  ? aspirin EC  81 mg Oral Daily  ? Chlorhexidine Gluconate Cloth  6 each Topical Q0600  ? clopidogrel  75 mg Oral Q breakfast  ? docusate sodium  100 mg Oral BID  ? finasteride  5 mg Oral Daily  ? gabapentin  300 mg Oral QHS  ? leptospermum manuka honey  1 application. Topical Daily  ? levalbuterol  0.63 mg Nebulization BID  ? melatonin  5 mg Oral QHS  ? methocarbamol  500 mg Oral TID  ? multivitamin with minerals  1 tablet Oral Daily  ? pantoprazole  40 mg Oral Daily  ? polyethylene glycol  17 g Oral BID  ? potassium chloride  20-40 mEq Oral Once  ? rosuvastatin  20 mg Oral Daily  ? sodium chloride flush  3 mL Intravenous Q12H  ? sodium chloride flush  3 mL Intravenous Q12H  ? terazosin  15 mg Oral QHS  ? ?Continuous Infusions: ? sodium chloride    ? sodium chloride    ? amiodarone 30 mg/hr (08/07/21 2359)  ? heparin 1,900 Units/hr (08/08/21 0746)  ? ?PRN Meds: ?sodium chloride, sodium chloride, acetaminophen, alum & mag hydroxide-simeth, guaiFENesin-dextromethorphan, hydrALAZINE, HYDROmorphone (DILAUDID) injection, labetalol, metoprolol tartrate, montelukast, ondansetron (ZOFRAN) IV, oxyCODONE, phenol, sodium chloride flush, sodium chloride flush  ? ?Vital Signs  ?  ?Vitals:  ? 08/07/21 2300 08/08/21 0400 08/08/21 0800 08/08/21 0936  ?BP: 111/68 109/71 112/63 114/67  ?Pulse: 97 93 97 98  ?Resp: '15 14 20 18  '$ ?Temp: 97.8 ?F (36.6 ?C) 98.1 ?F (36.7 ?C) 98.1 ?F (36.7 ?C)   ?TempSrc: Oral Oral Oral   ?SpO2: 100% 98% 97% 97%  ?Weight:      ?Height:      ? ? ?Intake/Output Summary (Last 24 hours) at 08/08/2021 1020 ?Last data filed at 08/08/2021 0746 ?Gross per 24 hour  ?Intake 1025.48 ml  ?Output 2400 ml  ?Net -1374.52 ml  ? ? ?  08/05/2021  ?  5:00 AM 08/04/2021  ?  2:10 PM 08/03/2021  ?  6:43 PM   ?Last 3 Weights  ?Weight (lbs) 199 lb 1.2 oz 195 lb 12.3 oz 220 lb  ?Weight (kg) 90.3 kg 88.8 kg 99.791 kg  ?   ? ?Telemetry  ?  ?A sensed V paced - Personally Reviewed ? ?ECG  ?  ?N/a - Personally Reviewed ? ?Physical Exam  ? ?GEN: No acute distress.   ?Neck: No JVD ?Cardiac: RRR, no murmurs, rubs, or gallops.  ?Respiratory: Clear to auscultation bilaterally. ?GI: Soft, nontender, non-distended  ?MS: 1+ left leg edema ?Neuro:  Nonfocal  ?Psych: Normal affect  ? ?Labs  ?  ?High Sensitivity Troponin:   ?Recent Labs  ?Lab 08/06/21 ?2002  ?TROPONINIHS 80*  ?   ?Chemistry ?Recent Labs  ?Lab 08/06/21 ?2002 08/06/21 ?2303 08/07/21 ?1610 08/08/21 ?0310  ?NA 128*  --  130* 129*  ?K 4.2  --  4.4 4.1  ?CL 104  --  102 103  ?CO2 19*  --  20* 21*  ?GLUCOSE 198*  --  217* 203*  ?BUN 22  --  18 12  ?CREATININE 0.54*  --  0.60* 0.57*  ?CALCIUM 7.9*  --  8.2* 7.9*  ?MG 1.6* 1.7 1.9 1.7  ?GFRNONAA >60  --  >  60 >60  ?ANIONGAP 5  --  8 5  ?  ?Lipids  ?Recent Labs  ?Lab 08/06/21 ?2303  ?CHOL 93  ?TRIG 64  ?HDL 40*  ?Douglas [name] 40  ?CHOLHDL 2.3  ?  ?Hematology ?Recent Labs  ?Lab 08/06/21 ?2303 08/07/21 ?7829 08/08/21 ?0310  ?WBC 4.6 4.2 4.4  ?RBC 2.82* 2.91* 2.82*  ?HGB 8.0* 8.0* 7.7*  ?HCT 25.0* 26.0* 25.0*  ?MCV 88.7 89.3 88.7  ?MCH 28.4 27.5 27.3  ?MCHC 32.0 30.8 30.8  ?RDW 17.0* 17.2* 17.3*  ?PLT 363 371 373  ? ?Thyroid No results for input(s): TSH, FREET4 in the last 168 hours.  ?BNP ?Recent Labs  ?Lab 08/07/21 ?5621  ?BNP 208.7*  ?  ?DDimer No results for input(s): DDIMER in the last 168 hours.  ? ?Radiology  ?  ?PERIPHERAL VASCULAR CATHETERIZATION ? ?Result Date: 08/06/2021 ?DATE OF SERVICE: 08/06/2021  PATIENT:  Douglas [name]  71 y.o. 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 4) left lower extremity and central venogram (15m total contrast) 5) conscious sedation (66 minutes)  SURGEON:  Surgeon(s) and Role:    * [name], Douglas Aline MD - Primary  ASSISTANT: none  ANESTHESIA:   local and IV sedation  EBL: 1056m BLOOD ADMINISTERED:none  DRAINS: none  LOCAL MEDICATIONS USED:  LIDOCAINE  SPECIMEN:  none  COUNTS: confirmed correct.  TOURNIQUET:  none  PATIENT DISPOSITION:  PACU - hemodynamically stable.  Delay start of Pharmacological VTE agent (>24hrs) due to surgical blood loss or risk of bleeding: no  INDICATION FOR PROCEDURE: Douglas [name] a 89.o. 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 After careful discussion of risks, benefits, and alternatives the patient was offered mechanical thrombectomy. The patient  understood and wished to proceed.  OPERATIVE FINDINGS: extensive deep venous thrombosis extending from peripheral IVC to popliteal vein on the left. Almost complete resolution after Inari ClotTriever x 8 passes.  DESCRIPTION OF PROCEDURE: After identification of the patient in the pre-operative holding area, the patient was transferred to the operating room. The patient was positioned prone on the operating room table. Anesthesia was induced. The left popliteal fossa was prepped and draped in standard fashion. A surgical pause was performed confirming correct patient, procedure, and operative location.  Using intraoperative ultrasound, the small saphenous vein in the proximal left calf was accessed with micropuncture technique.  Glidewire advantage was navigated into the iliac veins on the left side.  Her access was upsized gradually to 8 FrPakistanOver the wire, a Berenstein catheter was advanced to help select the inferior vena cava.  I was not able to access this blindly.  Central venogram was performed.  This showed severe clot  burden throughout the iliac bifurcation.  I was able to find a flow channel, and navigated the Glidewire through this into the right brachiocephalic vein. We upsized our access to a 1326rPakistannari purpose built sheath for mechanical thrombectomy.  The distal filter was deployed in standard fashion.  An InDevelopment worker, communityas brought onto the field and prepped in standard fashion.  I made 8 passes through the inferior vena cava into the left iliac veins, the left femoral  veins And left popliteal veins.  Copious clot returned with each pass.  At the final pass, limited clot returned. The clot triever device was removed.  Intravascular ultrasound was advanced over the wire.  Intravascular ultrasound showed near complete resolution of the thrombus burden throughout the IVC, iliac veins, femoral veins, and popliteal veins.   Ascending venograms were performed in stations from the sheath to the IVC.  This redemonstrated almost complete resolution of the thrombus burden. Satisfied end of the case here.  All endovascular equipment was removed.  A figure-of-eight stitch was placed around the access site.  The sheath was removed.  The suture was secured down.  Hemostasis was achieved.  A bandage was applied to the popliteal space.  Upon completion of the case instrument and sharps counts were confirmed correct. The patient was transferred to the  PACU in good condition. I was present for all portions of the procedure.  Yevonne Aline. Stanford Breed, MD [facility] Phone Number: [phone] 08/06/2021 4:18 PM  ? ?DG CHEST PORT 1 VIEW ? ?Result Date: 08/07/2021 ?CLINICAL DATA:  Dyspnea. EXAM: PORTABLE CHEST 1 VIEW COMPARISON:  01/20/2021. FINDINGS: Left chest wall pacer device noted with leads in the right atrial appendage and right ventricle. Heart size is normal. No pleural effusion or edema. No airspace opacities identified. Marked degenerative changes noted in the right glenohumeral joint.  IMPRESSION: No acute cardiopulmonary abnormalities. Electronically Signed   By: Kerby Moors M.D.   On: 08/07/2021 14:49  ? ?ECHOCARDIOGRAM COMPLETE ? ?Result Date: 08/07/2021 ?   ECHOCARDIOGRAM REPORT

## 2021-08-09 ENCOUNTER — Encounter (HOSPITAL_COMMUNITY): Payer: Self-pay | Admitting: Vascular Surgery

## 2021-08-09 DIAGNOSIS — I2602 Saddle embolus of pulmonary artery with acute cor pulmonale: Secondary | ICD-10-CM

## 2021-08-09 DIAGNOSIS — I739 Peripheral vascular disease, unspecified: Secondary | ICD-10-CM | POA: Diagnosis not present

## 2021-08-09 DIAGNOSIS — I471 Supraventricular tachycardia: Secondary | ICD-10-CM | POA: Diagnosis not present

## 2021-08-09 LAB — CBC
HCT: 25.9 % — ABNORMAL LOW (ref 39.0–52.0)
Hemoglobin: 8.2 g/dL — ABNORMAL LOW (ref 13.0–17.0)
MCH: 28.2 pg (ref 26.0–34.0)
MCHC: 31.7 g/dL (ref 30.0–36.0)
MCV: 89 fL (ref 80.0–100.0)
Platelets: 375 10*3/uL (ref 150–400)
RBC: 2.91 MIL/uL — ABNORMAL LOW (ref 4.22–5.81)
RDW: 17.3 % — ABNORMAL HIGH (ref 11.5–15.5)
WBC: 4.5 10*3/uL (ref 4.0–10.5)
nRBC: 0 % (ref 0.0–0.2)

## 2021-08-09 LAB — BASIC METABOLIC PANEL
Anion gap: 6 (ref 5–15)
BUN: 11 mg/dL (ref 8–23)
CO2: 23 mmol/L (ref 22–32)
Calcium: 7.9 mg/dL — ABNORMAL LOW (ref 8.9–10.3)
Chloride: 101 mmol/L (ref 98–111)
Creatinine, Ser: 0.58 mg/dL — ABNORMAL LOW (ref 0.61–1.24)
GFR, Estimated: >60 mL/min (ref >60)
Glucose, Bld: 153 mg/dL — ABNORMAL HIGH (ref 70–99)
Potassium: 4 mmol/L (ref 3.5–5.1)
Sodium: 130 mmol/L — ABNORMAL LOW (ref 135–145)

## 2021-08-09 LAB — MAGNESIUM: Magnesium: 1.7 mg/dL (ref 1.7–2.4)

## 2021-08-09 LAB — HEPARIN LEVEL (UNFRACTIONATED): Heparin Unfractionated: 0.56 IU/mL (ref 0.30–0.70)

## 2021-08-09 MED ORDER — APIXABAN 5 MG PO TABS
10.0000 mg | ORAL_TABLET | Freq: Two times a day (BID) | ORAL | Status: DC
  Administered 2021-08-09 – 2021-08-10 (×2): 10 mg via ORAL
  Filled 2021-08-09 (×2): qty 2

## 2021-08-09 MED ORDER — APIXABAN 5 MG PO TABS
5.0000 mg | ORAL_TABLET | Freq: Two times a day (BID) | ORAL | Status: DC
Start: 2021-08-16 — End: 2021-08-10

## 2021-08-09 MED ORDER — FUROSEMIDE 20 MG PO TABS
20.0000 mg | ORAL_TABLET | Freq: Every day | ORAL | Status: DC
  Administered 2021-08-09: 20 mg via ORAL
  Filled 2021-08-09 (×2): qty 1

## 2021-08-09 NOTE — Progress Notes (Signed)
? ?Progress Note ? ?Patient Name: Douglas [name] ?Date of Encounter: 08/09/2021 ? ?[facility] Cardiologist: Dr Lovena Le ? ?Subjective  ? ?No CP or dyspnea; complains of scrotal swelling ? ?Inpatient Medications  ?  ?Scheduled Meds: ? amiodarone  400 mg Oral BID  ? vitamin C  1,000 mg Oral Daily  ? aspirin EC  81 mg Oral Daily  ? Chlorhexidine Gluconate Cloth  6 each Topical Q0600  ? clopidogrel  75 mg Oral Q breakfast  ? docusate sodium  100 mg Oral BID  ? finasteride  5 mg Oral Daily  ? gabapentin  300 mg Oral QHS  ? leptospermum manuka honey  1 application. Topical Daily  ? levalbuterol  0.63 mg Nebulization BID  ? melatonin  5 mg Oral QHS  ? methocarbamol  500 mg Oral TID  ? multivitamin with minerals  1 tablet Oral Daily  ? pantoprazole  40 mg Oral Daily  ? polyethylene glycol  17 g Oral BID  ? potassium chloride  20-40 mEq Oral Once  ? rosuvastatin  20 mg Oral Daily  ? sodium chloride flush  3 mL Intravenous Q12H  ? sodium chloride flush  3 mL Intravenous Q12H  ? terazosin  15 mg Oral QHS  ? ?Continuous Infusions: ? sodium chloride    ? sodium chloride    ? heparin 1,750 Units/hr (08/09/21 0445)  ? ?PRN Meds: ?sodium chloride, sodium chloride, acetaminophen, alum & mag hydroxide-simeth, guaiFENesin-dextromethorphan, hydrALAZINE, HYDROmorphone (DILAUDID) injection, labetalol, metoprolol tartrate, montelukast, ondansetron (ZOFRAN) IV, oxyCODONE, phenol, sodium chloride flush, sodium chloride flush  ? ?Vital Signs  ?  ?Vitals:  ? 08/08/21 2000 08/09/21 0500 08/09/21 0820 08/09/21 0830  ?BP: 103/65 105/70 113/66   ?Pulse: (!) 106 97    ?Resp: 15 14    ?Temp:  98.2 ?F (36.8 ?C) 98.5 ?F (36.9 ?C)   ?TempSrc:  Oral Oral   ?SpO2: 96% 97%  96%  ?Weight:      ?Height:      ? ? ?Intake/Output Summary (Last 24 hours) at 08/09/2021 0903 ?Last data filed at 08/09/2021 7619 ?Gross per 24 hour  ?Intake 1250.26 ml  ?Output 1800 ml  ?Net -549.74 ml  ? ? ?  08/05/2021  ?  5:00 AM 08/04/2021  ?  2:10 PM 08/03/2021  ?  6:43 PM  ?Last 3  Weights  ?Weight (lbs) 199 lb 1.2 oz 195 lb 12.3 oz 220 lb  ?Weight (kg) 90.3 kg 88.8 kg 99.791 kg  ?   ? ?Telemetry  ?  ?Vpaced with PVCs - Personally Reviewed ? ?Physical Exam  ? ?GEN: No acute distress.   ?Neck: supple ?Cardiac: RRR ?Respiratory: Clear to auscultation bilaterally. ?GI: Soft, nontender, non-distended; positive presacral edema and scrotal edema ?MS: S/P RBKA; 1 + edema LLE ?Neuro:  Nonfocal  ?Psych: Normal affect  ? ?Labs  ?  ?High Sensitivity Troponin:   ?Recent Labs  ?Lab 08/06/21 ?2002  ?TROPONINIHS 80*  ?   ?Chemistry ?Recent Labs  ?Lab 08/07/21 ?5093 08/08/21 ?0310 08/09/21 ?0309  ?NA 130* 129* 130*  ?K 4.4 4.1 4.0  ?CL 102 103 101  ?CO2 20* 21* 23  ?GLUCOSE 217* 203* 153*  ?BUN '18 12 11  '$ ?CREATININE 0.60* 0.57* 0.58*  ?CALCIUM 8.2* 7.9* 7.9*  ?MG 1.9 1.7 1.7  ?GFRNONAA >60 >60 >60  ?ANIONGAP '8 5 6  '$ ?  ?Lipids  ?Recent Labs  ?Lab 08/06/21 ?2303  ?CHOL 93  ?TRIG 64  ?HDL 40*  ?Adams 40  ?CHOLHDL 2.3  ?  ?  Hematology ?Recent Labs  ?Lab 08/07/21 ?6967 08/08/21 ?0310 08/09/21 ?0309  ?WBC 4.2 4.4 4.5  ?RBC 2.91* 2.82* 2.91*  ?HGB 8.0* 7.7* 8.2*  ?HCT 26.0* 25.0* 25.9*  ?MCV 89.3 88.7 89.0  ?MCH 27.5 27.3 28.2  ?MCHC 30.8 30.8 31.7  ?RDW 17.2* 17.3* 17.3*  ?PLT 371 373 375  ? ? ?BNP ?Recent Labs  ?Lab 08/07/21 ?8938  ?BNP 208.7*  ?  ? ?Radiology  ?  ?CT Angio Chest Pulmonary Embolism (PE) W or WO Contrast ? ?Addendum Date: 08/08/2021   ?ADDENDUM REPORT: 08/08/2021 12:23 ADDENDUM: Critical Value/emergent results were called by telephone at the time of interpretation on 08/08/2021 at 12:21 pm to provider Orlie Pollen, MD , who verbally acknowledged these results. Electronically Signed   By: Misty Stanley M.D.   On: 08/08/2021 12:23  ? ?Result Date: 08/08/2021 ?CLINICAL DATA:  Pulmonary embolism suspected. EXAM: CT ANGIOGRAPHY CHEST WITH CONTRAST TECHNIQUE: Multidetector CT imaging of the chest was performed using the standard protocol during bolus administration of intravenous contrast. Multiplanar CT  image reconstructions and MIPs were obtained to evaluate the vascular anatomy. RADIATION DOSE REDUCTION: This exam was performed according to the departmental dose-optimization program which includes automated exposure control, adjustment of the mA and/or kV according to patient size and/or use of iterative reconstruction technique. CONTRAST:  168m OMNIPAQUE IOHEXOL 350 MG/ML SOLN COMPARISON:  08/05/2021 FINDINGS: Cardiovascular: The heart size is normal. No substantial pericardial effusion. Coronary artery calcification is evident. Mild atherosclerotic calcification is noted in the wall of the thoracic aorta. Lobar and segmental pulmonary embolus is identified in the left lower lobe. Lobar nonocclusive pulmonary embolus identified in the right upper lobe extending into segmental and subsegmental branches. There is pulmonary embolus in the right middle lobe and lobar right lower lobe pulmonary arteries. Right ventricle measures 4.7 cm. Left ventricular diameter is 4.8 cm. Mediastinum/Nodes: No mediastinal lymphadenopathy. There is no hilar lymphadenopathy. The esophagus has normal imaging features. There is no axillary lymphadenopathy. Lungs/Pleura: 4 mm right lower lobe nodule on 69/6 is stable. Stable scarring medial left apex. Additional scattered tiny nodules noted left lung. No new suspicious pulmonary nodule or mass. Dependent atelectasis in the lower lobes right greater than left, is new in the interval. Tiny bilateral pleural effusions evident. Upper Abdomen: Hypervascular splenic lesion is unchanged. Musculoskeletal: No worrisome lytic or sclerotic osseous abnormality. Degenerative changes noted in both shoulders. Review of the MIP images confirms the above findings. IMPRESSION: 1. Bilateral lobar and segmental pulmonary embolus with CT evidence of borderline right heart strain (RV/LV Ratio = 0.97) consistent with at least submassive (intermediate risk) PE. The presence of right heart strain has been  associated with an increased risk of morbidity and mortality. Please refer to the "PE Focused" order set in EPIC. 2. Tiny bilateral pleural effusions with dependent atelectasis in the lower lobes right greater than left. 3. Scattered bilateral tiny pulmonary nodules measuring up to 4-5 mm. No follow-up needed if patient is low-risk (and has no known or suspected primary neoplasm). Non-contrast chest CT can be considered in 12 months if patient is high-risk. This recommendation follows the consensus statement: Guidelines for Management of Incidental Pulmonary Nodules Detected on CT Images: From the Fleischner Society 2017; Radiology 2017; 284:228-243. 4. Aortic Atherosclerosis (ICD10-I70.0). Electronically Signed: By: EMisty StanleyM.D. On: 08/08/2021 12:06  ? ?DG CHEST PORT 1 VIEW ? ?Result Date: 08/07/2021 ?CLINICAL DATA:  Dyspnea. EXAM: PORTABLE CHEST 1 VIEW COMPARISON:  01/20/2021. FINDINGS: Left chest wall pacer device noted with leads  in the right atrial appendage and right ventricle. Heart size is normal. No pleural effusion or edema. No airspace opacities identified. Marked degenerative changes noted in the right glenohumeral joint. IMPRESSION: No acute cardiopulmonary abnormalities. Electronically Signed   By: Kerby Moors M.D.   On: 08/07/2021 14:49  ? ?ECHOCARDIOGRAM COMPLETE ? ?Result Date: 08/07/2021 ?   ECHOCARDIOGRAM REPORT   Patient Name:   Douglas [name] Date of Exam: 08/07/2021 Medical Rec #:  [id number]        Height:       72.0 in Accession #:    [id number]       Weight:       199.1 lb Date of Birth:  30-Oct-1950         BSA:          2.126 m? Patient Age:    71 years         BP:           107/61 mmHg Patient Gender: M                HR:           91 bpm. Exam Location:  Inpatient Procedure: 2D Echo, Cardiac Doppler and Color Doppler Indications:    Pulmonary embolism  History:        Patient has prior history of Echocardiogram examinations, most                 recent 01/17/2021. PAD, Arrythmias:2nd  degree AV block; Risk                 Factors:Diabetes and Hypertension.  Sonographer:    Merrie Roof RDCS Referring Phys: [id number] [place]  1. Septal flattening consistent with cor pul

## 2021-08-09 NOTE — Progress Notes (Addendum)
Physical Therapy Treatment ?Patient Details ?Name: Douglas [name] ?MRN: [id number] ?DOB: 1951-02-15 ?Today's Date: 08/09/2021 ? ? ?History of Present Illness 71 y.o. male who presented 08/03/21 with worsening L foot pain and edema. Pt with age indeterminate bil lower extremity DVTs. S/p abdominal aortogram with L superficial femoral artery stent 4/26. Hospital stay complicated by hypotension and atrial tachycardia or atrial flutter episode. S/p mechanical thrombectomy of inferior vena cava, left iliac veins, left femoral veins, left popliteal vein 4/28. 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 PMH: pacemaker, DM, BPH, HTN, and CLL. ? ?  ?PT Comments  ? ? Patient progressing well towards PT goals. Session focused on transfer training and family training with wife present. Tolerated lateral scoot transfer to/from recliner using slide board. Assist for setup/placement of slide board on first transfer with min guard assist and cues for technique. Required more verbal cues for transfer uphill back to bed with wife assisting, needing Mod A at one point to get over hump but then able to resume with close Min guard-Min A. Instructed wife on how to safely assist pt, positioning, use of gait belt, pillow case on slide board etc. HR in 120s bpm with activity and 2/4 DOE noted with rest break between transfer bouts. Noted to have scrotal swelling, which was painful during sliding. Consulted OT for scrotal sling to assist with comfort during mobility. Will plan for more family training with transfers next session to ensure safe mobility at home. ?  ?Recommendations for follow up therapy are one component of a multi-disciplinary discharge planning process, led by the attending physician.  Recommendations may be updated based on patient status, additional functional criteria and insurance authorization. ? ?Follow Up Recommendations ? Home health PT ?  ?  ?Assistance  Recommended at Discharge Frequent or constant Supervision/Assistance  ?Patient can return home with the following Assistance with cooking/housework;Assist for transportation;Help with stairs or ramp for entrance;A lot of help with bathing/dressing/bathroom;A lot of help with walking and/or transfers ?  ?Equipment Recommendations ? None recommended by PT  ?  ?Recommendations for Other Services   ? ? ?  ?Precautions / Restrictions Precautions ?Precautions: Fall ?Precaution Comments: Rt AKA 07/19/21; watch BP & HR ?Required Braces or Orthoses: Other Brace ?Other Brace: limb guard from previous surgery in the room ?Restrictions ?Weight Bearing Restrictions: Yes ?RLE Weight Bearing: Non weight bearing  ?  ? ?Mobility ? Bed Mobility ?Overal bed mobility: Needs Assistance ?Bed Mobility: Supine to Sit, Sit to Sidelying ?  ?  ?Supine to sit: Min assist, HOB elevated ?  ?Sit to sidelying: Min guard, HOB elevated ?General bed mobility comments: Assist with scooting bottom to get to EOB, increased time and use of rail. Able to get into bed without physical assist,. ?  ? ?Transfers ?Overall transfer level: Needs assistance ?Equipment used: Ambulation equipment used, Sliding board ?Transfers: Bed to chair/wheelchair/BSC ?  ?  ?  ?  ?  ? Lateral/Scoot Transfers: +2 safety/equipment, With slide board, Mod assist, Min guard ?General transfer comment: Practiced lateral scoot transfer to chair towards left using slide board, assist for slide board setup/placement and cues for technique and for anterior weight shift to offload bottom. Min guard assist for scooting. Provided family training with wife in room. Practiced lateral scoot from chair to bed using slide board with wife assisting- needing Mod A going back due to fatigue and getting stuck in middle but then able to  progress with min guard and wife guarding. ?  ? ?Ambulation/Gait ?  ?  ?  ?  ?  ?  ?  ?General Gait Details: unable ? ? ?Stairs ?  ?  ?  ?  ?  ? ? ?Wheelchair  Mobility ?  ? ?Modified Rankin (Stroke Patients Only) ?  ? ? ?  ?Balance Overall balance assessment: Needs assistance ?Sitting-balance support: Feet supported, Single extremity supported ?Sitting balance-Leahy Scale: Fair ?Sitting balance - Comments: Able to sit EOB with UE support. ?  ?  ?  ?  ?  ?  ?  ?  ?  ?  ?  ?  ?  ?  ?  ?  ? ?  ?Cognition Arousal/Alertness: Awake/alert ?Behavior During Therapy: Anxious, WFL for tasks assessed/performed ?Overall Cognitive Status: Within Functional Limits for tasks assessed ?  ?  ?  ?  ?  ?  ?  ?  ?  ?  ?  ?  ?  ?  ?  ?  ?General Comments: Motivated today and eager to mobilize. ?  ?  ? ?  ?Exercises Other Exercises ?Other Exercises: trunk rotations sitting EOB ? ?  ?General Comments General comments (skin integrity, edema, etc.): HR in 120s bpm. ?  ?  ? ?Pertinent Vitals/Pain Pain Assessment ?Pain Assessment: Faces ?Faces Pain Scale: Hurts little more ?Pain Location: scrotum ?Pain Descriptors / Indicators: Grimacing, Discomfort ?Pain Intervention(s): Monitored during session, Repositioned  ? ? ?Home Living   ?  ?  ?  ?  ?  ?  ?  ?  ?  ?   ?  ?Prior Function    ?  ?  ?   ? ?PT Goals (current goals can now be found in the care plan section) Progress towards PT goals: Progressing toward goals ? ?  ?Frequency ? ? ? Min 3X/week ? ? ? ?  ?PT Plan Current plan remains appropriate  ? ? ?Co-evaluation   ?  ?  ?  ?  ? ?  ?AM-PAC PT "6 Clicks" Mobility   ?Outcome Measure ? Help needed turning from your back to your side while in a flat bed without using bedrails?: A Little ?Help needed moving from lying on your back to sitting on the side of a flat bed without using bedrails?: A Little ?Help needed moving to and from a bed to a chair (including a wheelchair)?: A Lot ?Help needed standing up from a chair using your arms (e.g., wheelchair or bedside chair)?: Total ?Help needed to walk in hospital room?: Total ?Help needed climbing 3-5 steps with a railing? : Total ?6 Click Score: 11 ? ?   ?End of Session Equipment Utilized During Treatment: Gait belt ?Activity Tolerance: Patient tolerated treatment well ?Patient left: in bed;with call bell/phone within reach;with bed alarm set;with family/visitor present;with nursing/sitter in room ?Nurse Communication: Mobility status ?PT Visit Diagnosis: Muscle weakness (generalized) (M62.81);Pain ?Pain - part of body:  (scrotum) ?  ? ? ?Time: 6378-5885 ?PT Time Calculation (min) (ACUTE ONLY): 44 min ? ?Charges:  $Therapeutic Activity: 38-52 mins          ?          ? ?Marisa Severin, PT, DPT ?Acute Rehabilitation Services ?Secure chat preferred ?Office [phone] ? ? ? ? ? ?[place] ?08/09/2021, 1:19 PM ? ?

## 2021-08-09 NOTE — Progress Notes (Addendum)
[facility] [place] ? ?Subjective  - Feeling better, SOB improving.  ? ? ?Objective ?105/70 ?97 ?98.2 ?F (36.8 ?C) (Oral) ?14 ?97% ? ?Intake/Output Summary (Last 24 hours) at 08/09/2021 0703 ?Last data filed at 08/09/2021 0500 ?Gross per 24 hour  ?Intake 890.26 ml  ?Output 1800 ml  ?Net -909.74 ml  ? ? ?Heart RRR paced 97 bpm ?Lungs non labored breathing SAT 97% on RA ?Left LE without edema, soft compartments, motor intact ?PT/DP/Peroneal intact ?Abdomin soft ? ? ? ?Assessment/Planning: ?Right LE ischemia s/p failed Fem-pop bypass with vein.  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 ? ?Left LE with minimal/no edema and good inflow ?Right AKA healing well  ? ?[facility] in put: rhythm stable on amio gtt, convert to oral '400mg'$  bid x 1 week, then '200mg'$  bid, then '200mg'$  daily. At outpatient f/u would considering transitioning back to lopressor depending on bp's and resolution of RV strain. ? CTA 08/08/21 confirms Scattered bilateral tiny pulmonary nodules measuring up to 4-5 mm.He is being managed on IV Heparin with prior intervention of left LE for DVT burden requiring  mechanical thrombectomy of inferior vena cava, left iliac veins, left femoral veins, left popliteal vein. ? ? SOB has improved per patient. ?HGB improved likely dilutional 7.7 to 8.2 asymptomatic anemia   ? ? ?Roxy Horseman ?08/09/2021 ?7:03 AM ?-- ? ?Laboratory ?Lab Results: ?Recent Labs  ?  08/08/21 ?0310 08/09/21 ?0309  ?WBC 4.4 4.5  ?HGB 7.7* 8.2*  ?HCT 25.0* 25.9*  ?PLT 373 375  ? ?BMET ?Recent Labs  ?  08/08/21 ?0310 08/09/21 ?0309  ?NA 129* 130*  ?K 4.1 4.0  ?CL 103 101  ?CO2 21* 23  ?GLUCOSE 203* 153*  ?BUN 12 11  ?CREATININE 0.57* 0.58*  ?CALCIUM 7.9* 7.9*  ? ? ?COAG ?Lab Results  ?Component Value Date  ? INR 1.1 08/04/2021  ? INR 1.0 08/03/2021  ? INR 1.0 06/18/2021  ? ?No results found for: PTT ? ? ?I have independently interviewed and examined patient  and agree with PA assessment and plan above. Appears to be improving. Cardiology input much appreciated. Eliquis ordered and will be on plavix as well for sfa stenting on left.  ? ?Bryndle Corredor C. Donzetta Matters, MD ?[facility] ?Office: [phone] ?Pager: [phone] ? ?

## 2021-08-09 NOTE — Progress Notes (Signed)
ANTICOAGULATION CONSULT NOTE - Follow Up Consult ? ?Pharmacy Consult for Heparin infusion >> apixaban ?Indication:  VTE ? ?No Known Allergies ? ?Patient Measurements: ?Height: 6' (182.9 cm) ?Weight: 90.3 kg (199 lb 1.2 oz) ?IBW/kg (Calculated) : 77.6 ?Heparin Dosing Weight: 88.8 kg ? ?Vital Signs: ?Temp: 98.2 ?F (36.8 ?C) (05/01 1604) ?Temp Source: Oral (05/01 1604) ?BP: 113/66 (05/01 0820) ?Pulse Rate: 97 (05/01 0500) ? ?Labs: ?Recent Labs  ?  08/06/21 ?2002 08/06/21 ?2303 08/07/21 ?0854 08/08/21 ?0310 08/08/21 ?3748 08/08/21 ?2002 08/09/21 ?0309  ?HGB 8.3*   < > 8.0* 7.7*  --   --  8.2*  ?HCT 26.1*   < > 26.0* 25.0*  --   --  25.9*  ?PLT 357   < > 371 373  --   --  375  ?HEPARINUNFRC  --    < >  --  1.02* 0.87* 0.48 0.56  ?CREATININE 0.54*  --  0.60* 0.57*  --   --  0.58*  ?TROPONINIHS 80*  --   --   --   --   --   --   ? < > = values in this interval not displayed.  ? ? ? ?Estimated Creatinine Clearance: 91.6 mL/min (A) (by C-G formula based on SCr of 0.58 mg/dL (L)). ? ?Assessment: ?72 years of age male s/p mechanical thrombectomy for DVTs to continue on IV Heparin per consult. Pharmacy to dose. ? ?Heparin level at goal this AM.  No overt bleeding or complications noted. ? ?PM update - Pharmacy consulted to transition heparin infusion to apixaban. Will also be on Plavix for SFA stenting on the left per Vascular. Aspirin has been d/c'd. CBC stable. No bleed issues reported. ? ?Goal of Therapy:  ?Heparin level 0.3-0.7 units/ml ?Monitor platelets by anticoagulation protocol: Yes ?  ?Plan:  ?D/c heparin at time of first dose of apixaban - communicated plan with RN ?Start apixaban '10mg'$  PO BID x 7 days; then '5mg'$  PO BID ?Monitor CBC, s/sx bleeding ? ? ?Arturo Morton, PharmD, BCPS ?Please check AMION for all [place] contact numbers ?Clinical Pharmacist ?08/09/2021 4:50 PM ?

## 2021-08-09 NOTE — Progress Notes (Signed)
ANTICOAGULATION CONSULT NOTE - Follow Up Consult ? ?Pharmacy Consult for Heparin infusion ?Indication:  VTE ? ?No Known Allergies ? ?Patient Measurements: ?Height: 6' (182.9 cm) ?Weight: 90.3 kg (199 lb 1.2 oz) ?IBW/kg (Calculated) : 77.6 ?Heparin Dosing Weight: 88.8 kg ? ?Vital Signs: ?Temp: 98.5 ?F (36.9 ?C) (05/01 0820) ?Temp Source: Oral (05/01 0820) ?BP: 113/66 (05/01 0820) ?Pulse Rate: 97 (05/01 0500) ? ?Labs: ?Recent Labs  ?  08/06/21 ?2002 08/06/21 ?2303 08/07/21 ?0854 08/08/21 ?0310 08/08/21 ?3007 08/08/21 ?2002 08/09/21 ?0309  ?HGB 8.3*   < > 8.0* 7.7*  --   --  8.2*  ?HCT 26.1*   < > 26.0* 25.0*  --   --  25.9*  ?PLT 357   < > 371 373  --   --  375  ?HEPARINUNFRC  --    < >  --  1.02* 0.87* 0.48 0.56  ?CREATININE 0.54*  --  0.60* 0.57*  --   --  0.58*  ?TROPONINIHS 80*  --   --   --   --   --   --   ? < > = values in this interval not displayed.  ? ? ? ?Estimated Creatinine Clearance: 91.6 mL/min (A) (by C-G formula based on SCr of 0.58 mg/dL (L)). ? ?Assessment: ?71 years of age male s/p mechanical thrombectomy for DVTs to continue on IV Heparin per consult. Pharmacy to dose.  ? ?Heparin level at goal this AM.  No overt bleeding or complications noted. ? ?Goal of Therapy:  ?Heparin level 0.3-0.7 units/ml ?Monitor platelets by anticoagulation protocol: Yes ?  ?Plan:  ?Cont heparin 1750 units/hr  ?Monitor heparin level and CBC daily ?Continue to monitor for s/sx of bleeding ? ?[place] Crane, Pharm D, BCPS, BCCP ?Clinical Pharmacist ? 08/09/2021 9:05 AM  ? ?[facility] pharmacy phone numbers are listed on [email or web address] ? ? ? ? ?

## 2021-08-10 ENCOUNTER — Telehealth: Payer: Self-pay | Admitting: *Deleted

## 2021-08-10 ENCOUNTER — Other Ambulatory Visit (HOSPITAL_COMMUNITY): Payer: Self-pay

## 2021-08-10 DIAGNOSIS — I50810 Right heart failure, unspecified: Secondary | ICD-10-CM

## 2021-08-10 LAB — BASIC METABOLIC PANEL
Anion gap: 7 (ref 5–15)
BUN: 9 mg/dL (ref 8–23)
CO2: 24 mmol/L (ref 22–32)
Calcium: 8.1 mg/dL — ABNORMAL LOW (ref 8.9–10.3)
Chloride: 101 mmol/L (ref 98–111)
Creatinine, Ser: 0.65 mg/dL (ref 0.61–1.24)
GFR, Estimated: >60 mL/min (ref >60)
Glucose, Bld: 150 mg/dL — ABNORMAL HIGH (ref 70–99)
Potassium: 3.9 mmol/L (ref 3.5–5.1)
Sodium: 132 mmol/L — ABNORMAL LOW (ref 135–145)

## 2021-08-10 MED ORDER — CLOPIDOGREL BISULFATE 75 MG PO TABS
75.0000 mg | ORAL_TABLET | Freq: Every day | ORAL | 6 refills | Status: DC
  Filled 2021-08-10: qty 30, 30d supply, fill #0

## 2021-08-10 MED ORDER — ROSUVASTATIN CALCIUM 20 MG PO TABS
20.0000 mg | ORAL_TABLET | Freq: Every day | ORAL | 5 refills | Status: DC
  Filled 2021-08-10: qty 30, 30d supply, fill #0

## 2021-08-10 MED ORDER — LEVALBUTEROL HCL 0.63 MG/3ML IN NEBU: 0.6300 mg | INHALATION_SOLUTION | Freq: Four times a day (QID) | RESPIRATORY_TRACT | Status: DC | PRN

## 2021-08-10 MED ORDER — FUROSEMIDE 40 MG PO TABS
40.0000 mg | ORAL_TABLET | Freq: Every day | ORAL | Status: DC
  Administered 2021-08-10: 40 mg via ORAL
  Filled 2021-08-10: qty 1

## 2021-08-10 MED ORDER — APIXABAN 5 MG PO TABS
5.0000 mg | ORAL_TABLET | Freq: Two times a day (BID) | ORAL | 0 refills | Status: DC
Start: 2021-09-16 — End: 2021-09-02

## 2021-08-10 MED ORDER — APIXABAN (ELIQUIS) VTE STARTER PACK (10MG AND 5MG)
ORAL_TABLET | ORAL | 0 refills | Status: DC
  Filled 2021-08-10: qty 74, 30d supply, fill #0

## 2021-08-10 MED ORDER — APIXABAN (ELIQUIS) EDUCATION KIT FOR DVT/PE PATIENTS
PACK | Freq: Once | Status: DC
  Filled 2021-08-10: qty 1

## 2021-08-10 MED ORDER — APIXABAN 5 MG PO TABS
10.0000 mg | ORAL_TABLET | Freq: Two times a day (BID) | ORAL | 0 refills | Status: DC
  Filled 2021-08-10: qty 66, 30d supply, fill #0

## 2021-08-10 MED ORDER — AMIODARONE HCL 200 MG PO TABS
400.0000 mg | ORAL_TABLET | Freq: Two times a day (BID) | ORAL | 1 refills | Status: DC
  Filled 2021-08-10: qty 60, 15d supply, fill #0

## 2021-08-10 MED ORDER — APIXABAN 5 MG PO TABS
5.0000 mg | ORAL_TABLET | Freq: Two times a day (BID) | ORAL | 3 refills | Status: DC
  Filled 2021-08-10: qty 60, 30d supply, fill #0

## 2021-08-10 NOTE — TOC Transition Note (Addendum)
Transition of Care (TOC) - CM/SW Discharge Note ?Marvetta Gibbons Therapist, sports, BSN ?Transitions of Care ?Unit 4E- RN Case Manager ?See Treatment Team for direct phone #  ? ? ?Patient Details  ?Name: Douglas [name] ?MRN: [id number] ?Date of Birth: Oct 28, 1950 ? ?Transition of Care (TOC) CM/SW Contact:  ?Dahlia Client, Romeo Rabon, RN ?Phone Number: ?08/10/2021, 10:44 AM ? ? ?Clinical Narrative:    ?Pt stable for transition home today. Pt was active with [facility] prior to admission for HHRN/PT/OT. Orders placed to resume as previously ordered. Msg sent to [facility] with [facility] to notify for resumption of HH needs- RN/PT/OT.  ? ?Cm spoke with pt and wife- pt will transport home via EMS-  ?[facility] has been called ([phone]) to schedule transport- per [facility] they are not that busy this AM estimate should be here within the hour.  ? ?Pt has meds coming from [place] called to request meds be delivered as soon as possible - pt pending EMS transport- Per Jinny Blossom w/ TOC - meds are w/ delivery person and should be at bedside within the hour. Pt also has home meds stored in [place]- bedside RN aware and to be sure meds are picked up to send home with pt/wife.  ? ? ? ? ?Final next level of care: [facility] ?Barriers to Discharge: Barriers Resolved ? ? ?Patient Goals and CMS Choice ?Patient states their goals for this hospitalization and ongoing recovery are:: return home ?CMS Medicare.gov Compare Post Acute Care list provided to:: Patient ?Choice offered to / list presented to : Patient, Spouse ? ?Discharge Placement ?  ?           ? Home w/ HH ?  ?  ?  ? ?Discharge Plan and Services ?  ?Discharge Planning Services: CM Consult ?Post Acute Care Choice: Home Health, Resumption of Svcs/PTA Provider          ?DME Arranged: N/A ?DME Agency: NA ?  ?  ?  ?HH Arranged: RN, PT, OT ?[place] Agency: [facility] ?Date HH Agency Contacted: 08/10/21 ?Time [place]: 0930 ?Representative spoke with at [place]:  Lattie Haw ? ?Social Determinants of Health (SDOH) Interventions ?  ? ? ?Readmission Risk Interventions ? ?  08/10/2021  ? 10:44 AM 07/23/2021  ? 11:22 AM 07/01/2021  ?  3:48 PM  ?Readmission Risk Prevention Plan  ?Transportation Screening Complete Complete Complete  ?PCP or Specialist Appt within 3-5 Days  Complete   ?[place] or Home Care Consult  Complete   ?Social Work Consult for [place] Planning/Counseling  Complete   ?Palliative Care Screening  Not Applicable   ?Medication Review Press photographer) Complete Complete Complete  ?PCP or Specialist appointment within 3-5 days of discharge Complete    ?[place] or Home Care Consult Complete  Complete  ?SW Recovery Care/Counseling Consult Complete  Complete  ?Palliative Care Screening Not Applicable  Not Applicable  ?[place] Not Applicable  Not Applicable  ? ? ? ? ? ?

## 2021-08-10 NOTE — Telephone Encounter (Signed)
Douglas [name] states he was just discharged and has been put on Plavix and Eliquis. Wants to know if there is any problem taking the Imbruvica. ?

## 2021-08-10 NOTE — Progress Notes (Signed)
? ?Progress Note ? ?Patient Name: Douglas [name] ?Date of Encounter: 08/10/2021 ? ?[facility] Cardiologist: Dr Lovena Le ? ?Subjective  ? ?Pt denies CP or dyspnea ? ?Inpatient Medications  ?  ?Scheduled Meds: ? amiodarone  400 mg Oral BID  ? apixaban  10 mg Oral BID  ? Followed by  ? [START ON 08/16/2021] apixaban  5 mg Oral BID  ? vitamin C  1,000 mg Oral Daily  ? Chlorhexidine Gluconate Cloth  6 each Topical Q0600  ? clopidogrel  75 mg Oral Q breakfast  ? docusate sodium  100 mg Oral BID  ? finasteride  5 mg Oral Daily  ? furosemide  20 mg Oral Daily  ? gabapentin  300 mg Oral QHS  ? leptospermum manuka honey  1 application. Topical Daily  ? melatonin  5 mg Oral QHS  ? methocarbamol  500 mg Oral TID  ? multivitamin with minerals  1 tablet Oral Daily  ? pantoprazole  40 mg Oral Daily  ? polyethylene glycol  17 g Oral BID  ? potassium chloride  20-40 mEq Oral Once  ? rosuvastatin  20 mg Oral Daily  ? sodium chloride flush  3 mL Intravenous Q12H  ? sodium chloride flush  3 mL Intravenous Q12H  ? terazosin  15 mg Oral QHS  ? ?Continuous Infusions: ? sodium chloride    ? sodium chloride    ? ?PRN Meds: ?sodium chloride, sodium chloride, acetaminophen, alum & mag hydroxide-simeth, guaiFENesin-dextromethorphan, hydrALAZINE, HYDROmorphone (DILAUDID) injection, labetalol, levalbuterol, metoprolol tartrate, montelukast, ondansetron (ZOFRAN) IV, oxyCODONE, phenol, sodium chloride flush, sodium chloride flush  ? ?Vital Signs  ?  ?Vitals:  ? 08/09/21 2100 08/09/21 2300 08/10/21 0420 08/10/21 0806  ?BP: 105/64 116/76 120/70 112/64  ?Pulse: (!) 103 98 96 (!) 108  ?Resp: '16 13 16   '$ ?Temp:  97.9 ?F (36.6 ?C) 98.2 ?F (36.8 ?C) 98.2 ?F (36.8 ?C)  ?TempSrc:  Oral Axillary Oral  ?SpO2: 95% 91% 97%   ?Weight:      ?Height:      ? ? ?Intake/Output Summary (Last 24 hours) at 08/10/2021 0839 ?Last data filed at 08/10/2021 0810 ?Gross per 24 hour  ?Intake 446.14 ml  ?Output 2850 ml  ?Net -2403.86 ml  ? ? ? ?  08/05/2021  ?  5:00 AM 08/04/2021  ?   2:10 PM 08/03/2021  ?  6:43 PM  ?Last 3 Weights  ?Weight (lbs) 199 lb 1.2 oz 195 lb 12.3 oz 220 lb  ?Weight (kg) 90.3 kg 88.8 kg 99.791 kg  ?   ? ?Telemetry  ?  ?Vpaced with PVCs - Personally Reviewed ? ?Physical Exam  ? ?GEN: NAD ?Neck: Positive JVD ?Cardiac: RRR, no gallop ?Respiratory: CTA ?GI: Soft, NT/ND; positive presacral edema and scrotal edema ?MS: S/P RBKA; 2+ thigh edema ?Neuro:  Grossly intact ?Psych: Normal affect  ? ?Labs  ?  ?High Sensitivity Troponin:   ?Recent Labs  ?Lab 08/06/21 ?2002  ?TROPONINIHS 80*  ? ?   ?Chemistry ?Recent Labs  ?Lab 08/07/21 ?2952 08/08/21 ?0310 08/09/21 ?0309 08/10/21 ?0210  ?NA 130* 129* 130* 132*  ?K 4.4 4.1 4.0 3.9  ?CL 102 103 101 101  ?CO2 20* 21* 23 24  ?GLUCOSE 217* 203* 153* 150*  ?BUN '18 12 11 9  '$ ?CREATININE 0.60* 0.57* 0.58* 0.65  ?CALCIUM 8.2* 7.9* 7.9* 8.1*  ?MG 1.9 1.7 1.7  --   ?GFRNONAA >60 >60 >60 >60  ?ANIONGAP '8 5 6 7  '$ ? ?  ?Lipids  ?Recent  Labs  ?Lab 08/06/21 ?2303  ?CHOL 93  ?TRIG 64  ?HDL 40*  ?[place] 40  ?CHOLHDL 2.3  ? ?  ?Hematology ?Recent Labs  ?Lab 08/07/21 ?6160 08/08/21 ?0310 08/09/21 ?0309  ?WBC 4.2 4.4 4.5  ?RBC 2.91* 2.82* 2.91*  ?HGB 8.0* 7.7* 8.2*  ?HCT 26.0* 25.0* 25.9*  ?MCV 89.3 88.7 89.0  ?MCH 27.5 27.3 28.2  ?MCHC 30.8 30.8 31.7  ?RDW 17.2* 17.3* 17.3*  ?PLT 371 373 375  ? ? ? ?BNP ?Recent Labs  ?Lab 08/07/21 ?7371  ?BNP 208.7*  ? ?  ? ?Radiology  ?  ?CT Angio Chest Pulmonary Embolism (PE) W or WO Contrast ? ?Addendum Date: 08/08/2021   ?ADDENDUM REPORT: 08/08/2021 12:23 ADDENDUM: Critical Value/emergent results were called by telephone at the time of interpretation on 08/08/2021 at 12:21 pm to provider Orlie Pollen, MD , who verbally acknowledged these results. Electronically Signed   By: Misty Stanley M.D.   On: 08/08/2021 12:23  ? ?Result Date: 08/08/2021 ?CLINICAL DATA:  Pulmonary embolism suspected. EXAM: CT ANGIOGRAPHY CHEST WITH CONTRAST TECHNIQUE: Multidetector CT imaging of the chest was performed using the standard protocol  during bolus administration of intravenous contrast. Multiplanar CT image reconstructions and MIPs were obtained to evaluate the vascular anatomy. RADIATION DOSE REDUCTION: This exam was performed according to the departmental dose-optimization program which includes automated exposure control, adjustment of the mA and/or kV according to patient size and/or use of iterative reconstruction technique. CONTRAST:  147m OMNIPAQUE IOHEXOL 350 MG/ML SOLN COMPARISON:  08/05/2021 FINDINGS: Cardiovascular: The heart size is normal. No substantial pericardial effusion. Coronary artery calcification is evident. Mild atherosclerotic calcification is noted in the wall of the thoracic aorta. Lobar and segmental pulmonary embolus is identified in the left lower lobe. Lobar nonocclusive pulmonary embolus identified in the right upper lobe extending into segmental and subsegmental branches. There is pulmonary embolus in the right middle lobe and lobar right lower lobe pulmonary arteries. Right ventricle measures 4.7 cm. Left ventricular diameter is 4.8 cm. Mediastinum/Nodes: No mediastinal lymphadenopathy. There is no hilar lymphadenopathy. The esophagus has normal imaging features. There is no axillary lymphadenopathy. Lungs/Pleura: 4 mm right lower lobe nodule on 69/6 is stable. Stable scarring medial left apex. Additional scattered tiny nodules noted left lung. No new suspicious pulmonary nodule or mass. Dependent atelectasis in the lower lobes right greater than left, is new in the interval. Tiny bilateral pleural effusions evident. Upper Abdomen: Hypervascular splenic lesion is unchanged. Musculoskeletal: No worrisome lytic or sclerotic osseous abnormality. Degenerative changes noted in both shoulders. Review of the MIP images confirms the above findings. IMPRESSION: 1. Bilateral lobar and segmental pulmonary embolus with CT evidence of borderline right heart strain (RV/LV Ratio = 0.97) consistent with at least submassive  (intermediate risk) PE. The presence of right heart strain has been associated with an increased risk of morbidity and mortality. Please refer to the "PE Focused" order set in EPIC. 2. Tiny bilateral pleural effusions with dependent atelectasis in the lower lobes right greater than left. 3. Scattered bilateral tiny pulmonary nodules measuring up to 4-5 mm. No follow-up needed if patient is low-risk (and has no known or suspected primary neoplasm). Non-contrast chest CT can be considered in 12 months if patient is high-risk. This recommendation follows the consensus statement: Guidelines for Management of Incidental Pulmonary Nodules Detected on CT Images: From the Fleischner Society 2017; Radiology 2017; 284:228-243. 4. Aortic Atherosclerosis (ICD10-I70.0). Electronically Signed: By: EMisty StanleyM.D. On: 08/08/2021 12:06   ? ?Cardiac Studies  ? ? ?  1. Septal flattening consistent with cor pulmonale . Left ventricular  ?ejection fraction, by estimation, is 45 to 50%. The left ventricle has  ?mildly decreased function. The left ventricle has no regional wall motion  ?abnormalities. There is mild left  ?ventricular hypertrophy. Left ventricular diastolic parameters are  ?indeterminate.  ? 2. Right ventricular systolic function is severely reduced. The right  ?ventricular size is severely enlarged. There is moderately elevated  ?pulmonary artery systolic pressure.  ? 3. The mitral valve is abnormal. No evidence of mitral valve  ?regurgitation. No evidence of mitral stenosis.  ? 4. The aortic valve is tricuspid. There is mild calcification of the  ?aortic valve. Aortic valve regurgitation is not visualized. Aortic valve  ?sclerosis is present, with no evidence of aortic valve stenosis.  ? 5. The inferior vena cava is dilated in size with >50% respiratory  ?variability, suggesting right atrial pressure of 8 mmHg.  ? ?Patient Profile  ?   ?71 y.o. male with past medical history of CLL, hypertension, hyperlipidemia, BPH,  diabetes mellitus, previous pacemaker, status post right AKA, recent pulmonary emboli being evaluated for wide-complex tachycardia and hypotension. ? ?Assessment & Plan  ?  ?1 hypotension-patient's blood pre

## 2021-08-10 NOTE — Telephone Encounter (Signed)
Notified of Dr Hazeline Junker message. Will hold Imbuvica for now. ?

## 2021-08-10 NOTE — Progress Notes (Addendum)
[facility] [place] ? ?Subjective  - Doing well no new complaints ? ? ?Objective ?120/70 ?96 ?98.2 ?F (36.8 ?C) (Axillary) ?16 ?97% ? ?Intake/Output Summary (Last 24 hours) at 08/10/2021 0748 ?Last data filed at 08/09/2021 1700 ?Gross per 24 hour  ?Intake 806.14 ml  ?Output 2050 ml  ?Net -1243.86 ml  ? ?Left LE without edema, soft compartments, motor intact ?PT/DP/Peroneal intact ?Lungs non labored breathing SAT 97% on RA ?Abdomin soft ?Heart RRR paced ? ? ? ?Assessment/Planning: ?Right LE ischemia s/p failed Fem-pop bypass with vein.  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 ? ?Cardiology: ? hypotension-patient's blood pressure has improved.  We will continue to hold Avapro and amlodipine.  Continue amiodarone for now for  ? ?Left LE DVT re-started  Eliquis recent wide-complex tachycardia-felt likely secondary to SVT with ventricular pacing.  OP f/u with Cardiology ? ?F/U 09/02/21 for staple removal right AKA ? ?Has HH set up for RN/PT/OT already ? ? ?Roxy Horseman ?08/10/2021 ?7:48 AM ?-- ? ?Laboratory ?Lab Results: ?Recent Labs  ?  08/08/21 ?0310 08/09/21 ?0309  ?WBC 4.4 4.5  ?HGB 7.7* 8.2*  ?HCT 25.0* 25.9*  ?PLT 373 375  ? ?BMET ?Recent Labs  ?  08/09/21 ?0309 08/10/21 ?0210  ?NA 130* 132*  ?K 4.0 3.9  ?CL 101 101  ?CO2 23 24  ?GLUCOSE 153* 150*  ?BUN 11 9  ?CREATININE 0.58* 0.65  ?CALCIUM 7.9* 8.1*  ? ? ?COAG ?Lab Results  ?Component Value Date  ? INR 1.1 08/04/2021  ? INR 1.0 08/03/2021  ? INR 1.0 06/18/2021  ? ?No results found for: PTT ? ?I have independently interviewed and examined patient and agree with PA assessment and plan above.  He is okay for discharge today.  Left foot is well-perfused swelling is much improved.  Plan will be for Eliquis for at least 6 months but will be better to have him more mobile possibly with an above-knee prosthetic on the right prior to discontinuing the Eliquis.  He will also continue Plavix  for recent left SFA stenting.  We will have him follow-up in a few weeks for right AKA staple removal. ? ?Marvie Calender C. Donzetta Matters, MD ?[facility] ?Office: [phone] ?Pager: [phone] ? ? ?

## 2021-08-10 NOTE — Discharge Instructions (Addendum)
Information on my medicine - ELIQUIS? (apixaban) ? ?This medication education was reviewed with me or my healthcare representative as part of my discharge preparation.   ? ?Why was Eliquis? prescribed for you? ?Eliquis? was prescribed to treat blood clots that may have been found in the veins of your legs (deep vein thrombosis) or in your lungs (pulmonary embolism) and to reduce the risk of them occurring again. ? ?What do You need to know about Eliquis? ? ?The starting dose is 10 mg (two 5 mg tablets) taken TWICE daily for the FIRST SEVEN (7) DAYS, then on 08/16/21 (evening) the dose is reduced to ONE 5 mg tablet taken TWICE daily.  Eliquis? may be taken with or without food.  ? ?Try to take the dose about the same time in the morning and in the evening. If you have difficulty swallowing the tablet whole please discuss with your pharmacist how to take the medication safely. ? ?Take Eliquis? exactly as prescribed and DO NOT stop taking Eliquis? without talking to the doctor who prescribed the medication.  Stopping may increase your risk of developing a new blood clot.  Refill your prescription before you run out. ? ?After discharge, you should have regular check-up appointments with your healthcare provider that is prescribing your Eliquis?. ?   ?What do you do if you miss a dose? ?If a dose of ELIQUIS? is not taken at the scheduled time, take it as soon as possible on the same day and twice-daily administration should be resumed. The dose should not be doubled to make up for a missed dose. ? ?Important Safety Information ?A possible side effect of Eliquis? is bleeding. You should call your healthcare provider right away if you experience any of the following: ?Bleeding from an injury or your nose that does not stop. ?Unusual colored urine (red or dark brown) or unusual colored stools (red or black). ?Unusual bruising for unknown reasons. ?A serious fall or if you hit your head (even if there is no bleeding). ? ?Some  medicines may interact with Eliquis? and might increase your risk of bleeding or clotting while on Eliquis?[name] To help avoid this, consult your healthcare provider or pharmacist prior to using any new prescription or non-prescription medications, including herbals, vitamins, non-steroidal anti-inflammatory drugs (NSAIDs) and supplements. ? ?This website has more information on Eliquis? (apixaban): http://www.eliquis.com/eliquis/home  ?

## 2021-08-11 ENCOUNTER — Telehealth: Payer: Self-pay | Admitting: *Deleted

## 2021-08-11 NOTE — Discharge Summary (Signed)
[facility] Discharge Summary ? ? ?Patient ID:  ?Douglas [name] ?MRN: [id number] ?DOB/AGE: 05-07-1950 71 y.o. ? ?Admit date: 08/03/2021 ?Discharge date: 08/10/21 ?Date of Surgery: 08/06/2021 ?Surgeon: Surgeon(s): ?Cherre Robins, MD ? ?Admission Diagnosis: ?Hyponatremia [E87.1] ?PAD (peripheral artery disease) ([place]) [I73.9] ?Limb ischemia [I99.8] ? ?Discharge Diagnoses:  ?Hyponatremia [E87.1] ?PAD (peripheral artery disease) ([place]) [I73.9] ?Limb ischemia [I99.8] ? ?Secondary Diagnoses: ?Past Medical History:  ?Diagnosis Date  ? BPH (benign prostatic hyperplasia)   ? CLL (chronic lymphocytic leukemia) ([place])   ? Hypertension   ? Pacemaker   ? Medtronic Device  ? ? ?Procedure(s): ?LOWER EXTREMITY VENOGRAPHY ?PERIPHERAL VASCULAR THROMBECTOMY ? ?Discharged Condition: stable ? ?HPI: 71 y.o. male previous right above-knee amputation now with left leg pain with known left lower extremity occlusion but now with new swelling.  Vas venous duplex showed iliocaval and left lower extremity deep venous thrombosis. ? He underwent arterial angiogram by Dr. Trula Slade which demonstrated patent common femoral and profundofemroal artery, SFA with reconstitution of the posterior tibial artery.  There is two-vessel runoff via the posterior tibial and peroneal artery.   patient will be started on aspirin and Plavix.  I will also continue his IV heparin given his DVT. ?  ? ? ?Hospital Course:  ?Douglas [name] is a 71 y.o. male is S/P  ?Procedure(s): ?LOWER EXTREMITY VENOGRAPHY ?PERIPHERAL VASCULAR THROMBECTOMY ?By Dr. Stanford Breed showed:  extensive deep venous thrombosis extending from peripheral IVC to popliteal vein on the left. Almost complete resolution after Inari ClotTriever x 8 passes. ? Post op he was maintained on anticoagulation and transitioned to Eliquis prior to discharge.  He was noted to be SOB with tachycardia.  Cardiology was consulted and CTA chest was ordered.  He has a Psychologist, forensic implanted.   ? ?Cardiology in  put: rhythm stable on amio gtt, convert to oral '400mg'$  bid x 1 week, then '200mg'$  bid, then '200mg'$  daily. At outpatient f/u would considering transitioning back to lopressor depending on bp's and resolution of RV strain.  Cardiology f/u arranged by Cardiology.   ?             ?CTA 08/08/21 confirms Scattered bilateral tiny pulmonary nodules measuring up to 4-5 mm.He is being managed on IV Heparin with prior intervention of left LE for DVT burden requiring  mechanical thrombectomy of inferior vena cava, left iliac veins, left femoral veins, left popliteal vein. ?Edema was minimal at discharge, no SOB SAT 97 % on RA, HH RN/PT/OT was continued.  Foley cathter was maintained and he has a f/u with Urology 09/10/21.  F/U 09/02/21 for staple removal right AKA. ? ? ?Consults:  ?Treatment Team:  ?Douglas Sandy, MD ? ?Significant Diagnostic Studies: ?CBC ?Lab Results  ?Component Value Date  ? WBC 4.5 08/09/2021  ? HGB 8.2 (L) 08/09/2021  ? HCT 25.9 (L) 08/09/2021  ? MCV 89.0 08/09/2021  ? PLT 375 08/09/2021  ? ? ?BMET ?   ?Component Value Date/Time  ? NA 132 (L) 08/10/2021 0210  ? K 3.9 08/10/2021 0210  ? CL 101 08/10/2021 0210  ? CO2 24 08/10/2021 0210  ? GLUCOSE 150 (H) 08/10/2021 0210  ? BUN 9 08/10/2021 0210  ? CREATININE 0.65 08/10/2021 0210  ? CREATININE 0.94 05/04/2021 0752  ? CALCIUM 8.1 (L) 08/10/2021 0210  ? GFRNONAA >60 08/10/2021 0210  ? GFRNONAA >60 05/04/2021 0752  ? GFRAA >60 01/08/2020 0810  ? ?COAG ?Lab Results  ?Component Value Date  ? INR 1.1 08/04/2021  ? INR 1.0  08/03/2021  ? INR 1.0 06/18/2021  ? ? ? ?Disposition:  ?Discharge to :Home ?Discharge Instructions   ? ? Call MD for:  redness, tenderness, or signs of infection (pain, swelling, bleeding, redness, odor or green/yellow discharge around incision site)   Complete by: As directed ?  ? Call MD for:  redness, tenderness, or signs of infection (pain, swelling, bleeding, redness, odor or green/yellow discharge around incision site)   Complete by: As  directed ?  ? Call MD for:  severe or increased pain, loss or decreased feeling  in affected limb(s)   Complete by: As directed ?  ? Call MD for:  severe or increased pain, loss or decreased feeling  in affected limb(s)   Complete by: As directed ?  ? Call MD for:  temperature >100.5   Complete by: As directed ?  ? Call MD for:  temperature >100.5   Complete by: As directed ?  ? Resume previous diet   Complete by: As directed ?  ? Resume previous diet   Complete by: As directed ?  ? ?  ? ?Allergies as of 08/10/2021   ?No Known Allergies ?  ? ?  ?Medication List  ?  ? ?STOP taking these medications   ? ?irbesartan 300 MG tablet ?Commonly known as: AVAPRO ?  ?metoprolol tartrate 25 MG tablet ?Commonly known as: LOPRESSOR ?  ? ?  ? ?TAKE these medications   ? ?albuterol (2.5 MG/3ML) 0.083% nebulizer solution ?Commonly known as: PROVENTIL ?Take 2.5 mg by nebulization every 6 (six) hours as needed for wheezing or shortness of breath. ?  ?albuterol 108 (90 Base) MCG/ACT inhaler ?Commonly known as: VENTOLIN HFA ?Inhale 2 puffs into the lungs 4 (four) times daily. ?  ?amiodarone 200 MG tablet ?Commonly known as: PACERONE ?Take 2 tablets (400 mg total) by mouth 2 (two) times daily. ?  ?amLODipine 10 MG tablet ?Commonly known as: NORVASC ?Take 1 tablet (10 mg total) by mouth daily. ?  ?aspirin 81 MG EC tablet ?Take 1 tablet (81 mg total) by mouth daily. Swallow whole. ?  ?clopidogrel 75 MG tablet ?Commonly known as: PLAVIX ?Take 1 tablet (75 mg total) by mouth daily with breakfast. ?  ?docusate sodium 100 MG capsule ?Commonly known as: COLACE ?Take 1 capsule (100 mg total) by mouth 2 (two) times daily. ?What changed: when to take this ?  ?Eliquis DVT/PE Starter Pack ?Generic drug: Apixaban Starter Pack ('10mg'$  and '5mg'$ ) ?Take as directed on package: start with two-'5mg'$  tablets twice daily for 6 days (thru 08-16-21). On day 08-17-21 switch to one-'5mg'$  tablet twice daily. ?  ?apixaban 5 MG Tabs tablet ?Commonly known as: ELIQUIS ?Take 1  tablet (5 mg total) by mouth 2 (two) times daily. Starting after the completion of the Eliquis VTE Starter Pack. ?Start taking on: September 16, 2021 ?  ?finasteride 5 MG tablet ?Commonly known as: PROSCAR ?Take 1 tablet (5 mg total) by mouth daily. ?  ?gabapentin 300 MG capsule ?Commonly known as: NEURONTIN ?Take 1 capsule (300 mg total) by mouth at bedtime. ?  ?Imbruvica 420 MG tablet ?Generic drug: ibrutinib ?TAKE 1 TABLET BY MOUTH ONCE  DAILY WITH A FULL GLASS OF WATER ?What changed: See the new instructions. ?  ?melatonin 5 MG Tabs ?Take 1 tablet (5 mg total) by mouth at bedtime. ?  ?methocarbamol 500 MG tablet ?Commonly known as: ROBAXIN ?Take 1 tablet (500 mg total) by mouth 3 (three) times daily. ?What changed:  ?when to take this ?reasons to  take this ?  ?montelukast 10 MG tablet ?Commonly known as: SINGULAIR ?Take 1 tablet (10 mg total) by mouth daily as needed (allergies). ?  ?multivitamin with minerals Tabs tablet ?Take 1 tablet by mouth daily. ?  ?oxyCODONE 5 MG immediate release tablet ?Commonly known as: Oxy IR/ROXICODONE ?Take 1 tablet (5 mg total) by mouth every 4 (four) hours as needed for moderate pain. ?  ?oxymetazoline 0.05 % nasal spray ?Commonly known as: AFRIN ?Place 1 spray into both nostrils 2 (two) times daily as needed for congestion. ?  ?pantoprazole 40 MG tablet ?Commonly known as: PROTONIX ?Take 1 tablet (40 mg total) by mouth 2 (two) times daily. ?  ?polyethylene glycol 17 g packet ?Commonly known as: MIRALAX / GLYCOLAX ?Take 17 g by mouth 2 (two) times daily. ?  ?rosuvastatin 20 MG tablet ?Commonly known as: CRESTOR ?Take 1 tablet (20 mg total) by mouth daily. ?  ?terazosin 5 MG capsule ?Commonly known as: HYTRIN ?Take 3 capsules (15 mg total) by mouth at bedtime. ?What changed: when to take this ?  ?valsartan-hydrochlorothiazide 320-25 MG tablet ?Commonly known as: DIOVAN-HCT ?Take 1 tablet by mouth daily. ?  ?vitamin C 1000 MG tablet ?Take 1 tablet (1,000 mg total) by mouth daily. ?  ? ?   ? ?Verbal and written Discharge instructions given to the patient. Wound care per Discharge AVS ? Follow-up Information   ? ? [facility]. Schedule an appointment as soon as possible f

## 2021-08-11 NOTE — Telephone Encounter (Signed)
Patients wife called requesting we send an RX for O2 for patient since he had a PE in the hospital. Explained to her that he needed to be evaluated by his PCP for  O2 necessity . They will contact PCP. ?

## 2021-08-19 ENCOUNTER — Ambulatory Visit: Payer: 59 | Admitting: Physician Assistant

## 2021-08-21 ENCOUNTER — Other Ambulatory Visit (HOSPITAL_COMMUNITY): Payer: Self-pay

## 2021-08-21 ENCOUNTER — Telehealth (HOSPITAL_COMMUNITY): Payer: Self-pay

## 2021-08-21 NOTE — Telephone Encounter (Signed)
Pharmacy Transitions of Care Follow-up Telephone Call ? ?Date of discharge: 08/11/2030  ?Discharge Diagnosis: DVT ? ?How have you been since you were released from the hospital? Pt is doing well and improving. No concerns with medications at this time. Pt also requested refills of finasteride and methocarbamol--sent requests to PCP. ? ?Medication changes made at discharge: ? - START: Eliquis, clopidogrel, amiodarone ? ?Medication changes verified by the patient? Yes ?  ?Medication Accessibility: ? ?Home Pharmacy: [facility]  ? ?Was the patient provided with refills on discharged medications? yes  ? ?Have all prescriptions been transferred from [facility] to home pharmacy? yes  ? ?Garrel Ridgel, PharmD Candidate ?[facility] ? ?

## 2021-08-24 ENCOUNTER — Encounter (HOSPITAL_COMMUNITY): Payer: Self-pay | Admitting: Internal Medicine

## 2021-08-24 ENCOUNTER — Other Ambulatory Visit (HOSPITAL_COMMUNITY): Payer: Self-pay

## 2021-08-24 ENCOUNTER — Other Ambulatory Visit: Payer: Self-pay

## 2021-08-24 ENCOUNTER — Inpatient Hospital Stay (HOSPITAL_COMMUNITY)
Admission: EM | Admit: 2021-08-24 | Discharge: 2021-09-01 | DRG: 699 | Disposition: A | Discharge status: Home Health | Payer: 59 | Attending: Internal Medicine | Admitting: Internal Medicine

## 2021-08-24 ENCOUNTER — Observation Stay (HOSPITAL_COMMUNITY): Payer: 59

## 2021-08-24 DIAGNOSIS — D62 Acute posthemorrhagic anemia: Secondary | ICD-10-CM | POA: Diagnosis present

## 2021-08-24 DIAGNOSIS — Z7901 Long term (current) use of anticoagulants: Secondary | ICD-10-CM

## 2021-08-24 DIAGNOSIS — E1151 Type 2 diabetes mellitus with diabetic peripheral angiopathy without gangrene: Secondary | ICD-10-CM | POA: Diagnosis present

## 2021-08-24 DIAGNOSIS — Z7902 Long term (current) use of antithrombotics/antiplatelets: Secondary | ICD-10-CM

## 2021-08-24 DIAGNOSIS — N400 Enlarged prostate without lower urinary tract symptoms: Secondary | ICD-10-CM | POA: Diagnosis present

## 2021-08-24 DIAGNOSIS — L89311 Pressure ulcer of right buttock, stage 1: Secondary | ICD-10-CM | POA: Diagnosis present

## 2021-08-24 DIAGNOSIS — Z79899 Other long term (current) drug therapy: Secondary | ICD-10-CM

## 2021-08-24 DIAGNOSIS — C9110 Chronic lymphocytic leukemia of B-cell type not having achieved remission: Secondary | ICD-10-CM | POA: Diagnosis present

## 2021-08-24 DIAGNOSIS — R310 Gross hematuria: Secondary | ICD-10-CM | POA: Diagnosis not present

## 2021-08-24 DIAGNOSIS — R338 Other retention of urine: Secondary | ICD-10-CM | POA: Diagnosis present

## 2021-08-24 DIAGNOSIS — E871 Hypo-osmolality and hyponatremia: Secondary | ICD-10-CM | POA: Diagnosis present

## 2021-08-24 DIAGNOSIS — Z86718 Personal history of other venous thrombosis and embolism: Secondary | ICD-10-CM

## 2021-08-24 DIAGNOSIS — L89621 Pressure ulcer of left heel, stage 1: Secondary | ICD-10-CM | POA: Diagnosis present

## 2021-08-24 DIAGNOSIS — S3739XA Other injury of urethra, initial encounter: Secondary | ICD-10-CM | POA: Diagnosis present

## 2021-08-24 DIAGNOSIS — Z87891 Personal history of nicotine dependence: Secondary | ICD-10-CM

## 2021-08-24 DIAGNOSIS — I471 Supraventricular tachycardia, unspecified: Secondary | ICD-10-CM | POA: Diagnosis present

## 2021-08-24 DIAGNOSIS — Z7982 Long term (current) use of aspirin: Secondary | ICD-10-CM

## 2021-08-24 DIAGNOSIS — L89321 Pressure ulcer of left buttock, stage 1: Secondary | ICD-10-CM | POA: Diagnosis present

## 2021-08-24 DIAGNOSIS — Z86711 Personal history of pulmonary embolism: Secondary | ICD-10-CM

## 2021-08-24 DIAGNOSIS — Y846 Urinary catheterization as the cause of abnormal reaction of the patient, or of later complication, without mention of misadventure at the time of the procedure: Secondary | ICD-10-CM | POA: Diagnosis present

## 2021-08-24 DIAGNOSIS — Z950 Presence of cardiac pacemaker: Secondary | ICD-10-CM

## 2021-08-24 DIAGNOSIS — N401 Enlarged prostate with lower urinary tract symptoms: Secondary | ICD-10-CM | POA: Diagnosis present

## 2021-08-24 DIAGNOSIS — R339 Retention of urine, unspecified: Secondary | ICD-10-CM | POA: Diagnosis present

## 2021-08-24 DIAGNOSIS — E119 Type 2 diabetes mellitus without complications: Secondary | ICD-10-CM

## 2021-08-24 DIAGNOSIS — T8383XA Hemorrhage of genitourinary prosthetic devices, implants and grafts, initial encounter: Secondary | ICD-10-CM | POA: Diagnosis not present

## 2021-08-24 DIAGNOSIS — I739 Peripheral vascular disease, unspecified: Secondary | ICD-10-CM | POA: Diagnosis present

## 2021-08-24 DIAGNOSIS — I10 Essential (primary) hypertension: Secondary | ICD-10-CM | POA: Diagnosis present

## 2021-08-24 DIAGNOSIS — Z801 Family history of malignant neoplasm of trachea, bronchus and lung: Secondary | ICD-10-CM

## 2021-08-24 DIAGNOSIS — I2699 Other pulmonary embolism without acute cor pulmonale: Secondary | ICD-10-CM

## 2021-08-24 DIAGNOSIS — Z8249 Family history of ischemic heart disease and other diseases of the circulatory system: Secondary | ICD-10-CM

## 2021-08-24 DIAGNOSIS — E1169 Type 2 diabetes mellitus with other specified complication: Secondary | ICD-10-CM

## 2021-08-24 DIAGNOSIS — Z89611 Acquired absence of right leg above knee: Secondary | ICD-10-CM

## 2021-08-24 DIAGNOSIS — R319 Hematuria, unspecified: Secondary | ICD-10-CM | POA: Diagnosis present

## 2021-08-24 DIAGNOSIS — I749 Embolism and thrombosis of unspecified artery: Secondary | ICD-10-CM | POA: Diagnosis present

## 2021-08-24 HISTORY — DX: Other pulmonary embolism without acute cor pulmonale: I26.99

## 2021-08-24 HISTORY — DX: Peripheral vascular disease, unspecified: I73.9

## 2021-08-24 LAB — CBC WITH DIFFERENTIAL/PLATELET
Abs Immature Granulocytes: 0.14 10*3/uL — ABNORMAL HIGH (ref 0.00–0.07)
Basophils Absolute: 0.1 10*3/uL (ref 0.0–0.1)
Basophils Relative: 0 %
Eosinophils Absolute: 0.2 10*3/uL (ref 0.0–0.5)
Eosinophils Relative: 1 %
HCT: 35.2 % — ABNORMAL LOW (ref 39.0–52.0)
Hemoglobin: 10.8 g/dL — ABNORMAL LOW (ref 13.0–17.0)
Immature Granulocytes: 1 %
Lymphocytes Relative: 32 %
Lymphs Abs: 9.6 10*3/uL — ABNORMAL HIGH (ref 0.7–4.0)
MCH: 27.4 pg (ref 26.0–34.0)
MCHC: 30.7 g/dL (ref 30.0–36.0)
MCV: 89.3 fL (ref 80.0–100.0)
Monocytes Absolute: 9.2 10*3/uL — ABNORMAL HIGH (ref 0.1–1.0)
Monocytes Relative: 31 %
Neutro Abs: 10.6 10*3/uL — ABNORMAL HIGH (ref 1.7–7.7)
Neutrophils Relative %: 35 %
Platelets: 316 10*3/uL (ref 150–400)
RBC: 3.94 MIL/uL — ABNORMAL LOW (ref 4.22–5.81)
RDW: 18.5 % — ABNORMAL HIGH (ref 11.5–15.5)
WBC: 29.9 10*3/uL — ABNORMAL HIGH (ref 4.0–10.5)
nRBC: 0 % (ref 0.0–0.2)

## 2021-08-24 LAB — CBC
HCT: 35.1 % — ABNORMAL LOW (ref 39.0–52.0)
Hemoglobin: 10.7 g/dL — ABNORMAL LOW (ref 13.0–17.0)
MCH: 27.1 pg (ref 26.0–34.0)
MCHC: 30.5 g/dL (ref 30.0–36.0)
MCV: 88.9 fL (ref 80.0–100.0)
Platelets: 374 10*3/uL (ref 150–400)
RBC: 3.95 MIL/uL — ABNORMAL LOW (ref 4.22–5.81)
RDW: 18.8 % — ABNORMAL HIGH (ref 11.5–15.5)
WBC: 39.7 10*3/uL — ABNORMAL HIGH (ref 4.0–10.5)
nRBC: 0 % (ref 0.0–0.2)

## 2021-08-24 LAB — BASIC METABOLIC PANEL
Anion gap: 9 (ref 5–15)
BUN: 10 mg/dL (ref 8–23)
CO2: 24 mmol/L (ref 22–32)
Calcium: 8.7 mg/dL — ABNORMAL LOW (ref 8.9–10.3)
Chloride: 99 mmol/L (ref 98–111)
Creatinine, Ser: 0.51 mg/dL — ABNORMAL LOW (ref 0.61–1.24)
GFR, Estimated: >60 mL/min (ref >60)
Glucose, Bld: 169 mg/dL — ABNORMAL HIGH (ref 70–99)
Potassium: 4 mmol/L (ref 3.5–5.1)
Sodium: 132 mmol/L — ABNORMAL LOW (ref 135–145)

## 2021-08-24 LAB — PSA: Prostatic Specific Antigen: 8 ng/mL — ABNORMAL HIGH (ref 0.00–4.00)

## 2021-08-24 MED ORDER — FINASTERIDE 5 MG PO TABS
5.0000 mg | ORAL_TABLET | Freq: Every day | ORAL | Status: DC
  Administered 2021-08-24 – 2021-09-01 (×9): 5 mg via ORAL
  Filled 2021-08-24 (×9): qty 1

## 2021-08-24 MED ORDER — HYDROMORPHONE HCL 1 MG/ML IJ SOLN
1.0000 mg | INTRAMUSCULAR | Status: DC | PRN
  Administered 2021-08-24 – 2021-09-01 (×46): 1 mg via INTRAVENOUS
  Filled 2021-08-24 (×45): qty 1

## 2021-08-24 MED ORDER — IOHEXOL 300 MG/ML  SOLN
100.0000 mL | Freq: Once | INTRAMUSCULAR | Status: AC | PRN
  Administered 2021-08-24: 100 mL via INTRAVENOUS

## 2021-08-24 MED ORDER — POLYETHYLENE GLYCOL 3350 17 G PO PACK
17.0000 g | PACK | Freq: Every day | ORAL | Status: DC | PRN
  Administered 2021-08-27 – 2021-08-31 (×2): 17 g via ORAL
  Filled 2021-08-24 (×2): qty 1

## 2021-08-24 MED ORDER — ACETAMINOPHEN 650 MG RE SUPP: 650.0000 mg | Freq: Four times a day (QID) | RECTAL | Status: DC | PRN

## 2021-08-24 MED ORDER — ONDANSETRON HCL 4 MG PO TABS: 4.0000 mg | ORAL_TABLET | Freq: Four times a day (QID) | ORAL | Status: DC | PRN

## 2021-08-24 MED ORDER — BISACODYL 5 MG PO TBEC
5.0000 mg | DELAYED_RELEASE_TABLET | Freq: Every day | ORAL | Status: DC | PRN
  Administered 2021-08-27 – 2021-08-31 (×3): 5 mg via ORAL
  Filled 2021-08-24 (×3): qty 1

## 2021-08-24 MED ORDER — ALBUTEROL SULFATE (2.5 MG/3ML) 0.083% IN NEBU
2.5000 mg | INHALATION_SOLUTION | RESPIRATORY_TRACT | Status: DC | PRN
  Administered 2021-08-25 – 2021-08-31 (×12): 2.5 mg via RESPIRATORY_TRACT
  Filled 2021-08-24 (×12): qty 3

## 2021-08-24 MED ORDER — PROCHLORPERAZINE EDISYLATE 10 MG/2ML IJ SOLN
10.0000 mg | INTRAMUSCULAR | Status: DC | PRN
  Administered 2021-08-24 – 2021-09-01 (×10): 10 mg via INTRAVENOUS
  Filled 2021-08-24 (×11): qty 2

## 2021-08-24 MED ORDER — ONDANSETRON HCL 4 MG/2ML IJ SOLN
4.0000 mg | Freq: Four times a day (QID) | INTRAMUSCULAR | Status: DC | PRN
  Administered 2021-08-24 – 2021-08-28 (×4): 4 mg via INTRAVENOUS
  Filled 2021-08-24 (×5): qty 2

## 2021-08-24 MED ORDER — SODIUM CHLORIDE 0.9 % IV SOLN
1.0000 g | Freq: Once | INTRAVENOUS | Status: AC
  Administered 2021-08-24: 1 g via INTRAVENOUS
  Filled 2021-08-24: qty 10

## 2021-08-24 MED ORDER — ROSUVASTATIN CALCIUM 20 MG PO TABS
20.0000 mg | ORAL_TABLET | Freq: Every day | ORAL | Status: DC
  Administered 2021-08-24 – 2021-09-01 (×9): 20 mg via ORAL
  Filled 2021-08-24 (×9): qty 1

## 2021-08-24 MED ORDER — LACTATED RINGERS IV SOLN
INTRAVENOUS | Status: DC
Start: 2021-08-24 — End: 2021-08-25

## 2021-08-24 MED ORDER — ACETAMINOPHEN 325 MG PO TABS: 650.0000 mg | ORAL_TABLET | Freq: Four times a day (QID) | ORAL | Status: DC | PRN

## 2021-08-24 MED ORDER — LACTATED RINGERS IV SOLN: INTRAVENOUS | Status: DC

## 2021-08-24 MED ORDER — HYDRALAZINE HCL 20 MG/ML IJ SOLN: 5.0000 mg | INTRAMUSCULAR | Status: DC | PRN

## 2021-08-24 MED ORDER — HYDROMORPHONE HCL 1 MG/ML IJ SOLN
1.0000 mg | INTRAMUSCULAR | Status: DC | PRN
  Administered 2021-08-24 (×2): 1 mg via INTRAVENOUS
  Filled 2021-08-24 (×3): qty 1

## 2021-08-24 MED ORDER — MELATONIN 5 MG PO TABS
5.0000 mg | ORAL_TABLET | Freq: Every day | ORAL | Status: DC
Start: 2021-08-24 — End: 2021-09-02
  Administered 2021-08-24 – 2021-08-31 (×8): 5 mg via ORAL
  Filled 2021-08-24 (×8): qty 1

## 2021-08-24 MED ORDER — CHLORHEXIDINE GLUCONATE CLOTH 2 % EX PADS
6.0000 | MEDICATED_PAD | Freq: Every day | CUTANEOUS | Status: DC
  Administered 2021-08-24 – 2021-09-01 (×9): 6 via TOPICAL

## 2021-08-24 MED ORDER — SODIUM CHLORIDE 0.9% FLUSH
3.0000 mL | Freq: Two times a day (BID) | INTRAVENOUS | Status: DC
  Administered 2021-08-24 – 2021-09-01 (×12): 3 mL via INTRAVENOUS

## 2021-08-24 NOTE — ED Notes (Signed)
Ice packs applied to chest, neck and groin due to patient being hot, diaphoretic and tachy due to pain.  Catheter is completely occluded and not draining.  ?

## 2021-08-24 NOTE — ED Notes (Signed)
Attempted to irrigate, only getting clots.  Notified Dr Lorin Mercy patients HR staying in 130s and hypertensive.  Patient is begging for help.  Dilaudid has not touch his pain.   ?

## 2021-08-24 NOTE — ED Notes (Signed)
Dr Tresa Moore at bedside with urology cart.  ?

## 2021-08-24 NOTE — Consult Note (Signed)
Reason for Consult: Gross Hematuria / Foley Trauma, Urinary Retention / Very Large Prostate ? ?Referring Physician: Felecia Shelling MD ? ?Burhan Barham is an 71 y.o. male.  ? ?HPI:  ? ?1 - Gross Hematuria / Foley Trauma - new gross hematuria with clots noted after home health trauamtic foley change 08/23/21. He is on blood thinners for DVT/PE/PAD (holding). CT today with large prostate and diffuse adenopathy. No renal lesions / stones.  ? ?2 - Urinary Retention / Very Large Prostate - foley dependant since 06/2030 for presuemd BPH per history. NO recent cysto / GU eval. On alpha blockers at baseline x years, finasteride added 07/2021. Prostate vol 47m with median lobe by CT 08/2021. ? ?3 - Widely Metastatic Cancer - known CLL. Diffuse adenopathy including pelvic on CT 08/2021, PSA this admission pending.  ? ?PMH sig for DM2 (A1c 6-7), PAD/BKA, Pacemaker, DVT/PE, CLL with variable compliance[facility]  ? ?Today "Douglas [name] is seen in consultation for above. He has major vascular, cardiac, and oncologic comorbidity. Hgb 10s, Cr 0.5  ? ?Past Medical History:  ?Diagnosis Date  ? BPH (benign prostatic hyperplasia)   ? C[place]ocytic [place]) ([place]   ? Hypertension   ? Pacemaker   ? Medtronic Device  ? ? ?Past Surgical History:  ?Procedure Laterality Date  ? ABDOMINAL AORTOGRAM W/LOWER EXTREMITY Bilateral 06/21/2021  ? Procedure: ABDOMINAL AORTOGRAM W/LOWER EXTREMITY;  Surgeon: CWaynetta Sandy MD;  [facility];  Service: Cardiovascular;  Laterality: Bilateral;  ? ABDOMINAL AORTOGRAM W/LOWER EXTREMITY N/A 08/04/2021  ? Procedure: ABDOMINAL AORTOGRAM W/LOWER EXTREMITY;  Surgeon: BSerafina Mitchell MD;  [facility];  Service: Cardiovascular;  Laterality: N/A;  ? AMPUTATION Right 06/25/2021  ? Procedure: RIGHT BELOW KNEE AMPUTATION;  Surgeon: CWaynetta Sandy MD;  [facility]  Service: Vascular;  Laterality: Right;  ? AMPUTATION  Right 07/19/2021  ? Procedure: AMPUTATION ABOVE KNEE;  Surgeon: DAngelia Mould MD;  [facility];  Service: Vascular;  Laterality: Right;  ? FEMORAL-POPLITEAL BYPASS GRAFT Right 06/22/2021  ? Procedure: RIGHT FEMORAL-POPLITEAL BYPASS WITH VEIN, RIGHT POPLITEAL THROMBECTOMY;  Surgeon: CWaynetta Sandy MD;  [facility]  Service: Vascular;  Laterality: Right;  ? LEFT HEART CATH AND CORONARY ANGIOGRAPHY N/A 01/18/2021  ? Procedure: LEFT HEART CATH AND CORONARY ANGIOGRAPHY;  Surgeon: MBurnell Blanks MD;  [facility];  Service: Cardiovascular;  Laterality: N/A;  ? LOWER EXTREMITY VENOGRAPHY Left 08/06/2021  ? Procedure: LOWER EXTREMITY VENOGRAPHY;  Surgeon: HCherre Robins MD;  [facility];  Service: Cardiovascular;  Laterality: Left;  ? PACEMAKER IMPLANT N/A 01/19/2021  ? Procedure: PACEMAKER IMPLANT;  Surgeon: TEvans Lance MD;  [facility];  Service: Cardiovascular;  Laterality: N/A;  ? PERIPHERAL VASCULAR INTERVENTION  08/04/2021  ? Procedure: PERIPHERAL VASCULAR INTERVENTION;  Surgeon: BSerafina Mitchell MD;  [facility];  Service: Cardiovascular;;  ? PERIPHERAL VASCULAR THROMBECTOMY  08/06/2021  ? Procedure: PERIPHERAL VASCULAR THROMBECTOMY;  Surgeon: HCherre Robins MD;  [facility];  Service: Cardiovascular;;  Left Popliteal Vein  ? ? ?Family History  ?Problem Relation Age of Onset  ? Lung cancer Mother   ? Coronary artery disease Father   ? ? ?Social History:  reports that he has quit smoking. His smoking use included cigarettes. He has a 5.00 pack-year smoking history. He has never used smokeless tobacco. He reports that  he does not currently use alcohol. He reports that he does not use drugs. ? ?Allergies: No Known Allergies ? ?Medications: I have reviewed the patient's current medications. ? ?Results for orders placed or performed during the hospital encounter of 08/24/21 (from the past 48  hour(s))  ?CBC with Differential     Status: Abnormal  ? Collection Time: 08/24/21  6:01 AM  ?Result Value Ref Range  ? WBC 29.9 (H) 4.0 - 10.5 K/uL  ? RBC 3.94 (L) 4.22 - 5.81 MIL/uL  ? Hemoglobin 10.8 (L) 13.0 - 17.0 g/dL  ? HCT 35.2 (L) 39.0 - 52.0 %  ? MCV 89.3 80.0 - 100.0 fL  ? MCH 27.4 26.0 - 34.0 pg  ? MCHC 30.7 30.0 - 36.0 g/dL  ? RDW 18.5 (H) 11.5 - 15.5 %  ? Platelets 316 150 - 400 K/uL  ? nRBC 0.0 0.0 - 0.2 %  ? Neutrophils Relative % 35 %  ? Neutro Abs 10.6 (H) 1.7 - 7.7 K/uL  ? Lymphocytes Relative 32 %  ? Lymphs Abs 9.6 (H) 0.7 - 4.0 K/uL  ? Monocytes Relative 31 %  ? Monocytes Absolute 9.2 (H) 0.1 - 1.0 K/uL  ? Eosinophils Relative 1 %  ? Eosinophils Absolute 0.2 0.0 - 0.5 K/uL  ? Basophils Relative 0 %  ? Basophils Absolute 0.1 0.0 - 0.1 K/uL  ? WBC Morphology ATYPICAL MONONUCLEAR CELLS   ?  Comment: SMUDGE CELLS ?TO PATHOLOGY FOR REVIEW ?  ? RBC Morphology MORPHOLOGY UNREMARKABLE   ? Smear Review See Note   ?  Comment: SMALL PLATELET CLUMPS PRESENT  ? Immature Granulocytes 1 %  ? Abs Immature Granulocytes 0.14 (H) 0.00 - 0.07 K/uL  ?  Comment: Performed at [facility], [place] [address], [place], [place] [address]  ?Basic metabolic panel     Status: Abnormal  ? Collection Time: 08/24/21  6:01 AM  ?Result Value Ref Range  ? Sodium 132 (L) 135 - 145 mmol/L  ? Potassium 4.0 3.5 - 5.1 mmol/L  ? Chloride 99 98 - 111 mmol/L  ? CO2 24 22 - 32 mmol/L  ? Glucose, Bld 169 (H) 70 - 99 mg/dL  ?  Comment: Glucose reference range applies only to samples taken after fasting for at least 8 hours.  ? BUN 10 8 - 23 mg/dL  ? Creatinine, Ser 0.51 (L) 0.61 - 1.24 mg/dL  ? Calcium 8.7 (L) 8.9 - 10.3 mg/dL  ? GFR, Estimated >60 >60 mL/min  ?  Comment: (NOTE) ?Calculated using the CKD-EPI Creatinine Equation (2021) ?  ? Anion gap 9 5 - 15  ?  Comment: Performed at [facility], [place] [address], [place], [place] [address]  ? ? ?No results found. ? ?Review of Systems  ?Constitutional:  Positive for diaphoresis.  Negative for fever.  ?Genitourinary:  Positive for difficulty urinating and hematuria.  ?All other systems reviewed and are negative. ?Blood pressure (!) 152/77, pulse 91, temperature 97.8 ?F (36.6 ?C), temperature source Oral, resp. rate 18, height 6' (1.829 m), weight 99.8 kg, SpO2 97 %. ?Physical Exam ?Vitals reviewed.  ?Constitutional:   ?   Comments: Stigmata of chronic disease. In ER. Wife at bedside. NO distress at present.   ?HENT:  ?   Head: Normocephalic.  ?   Nose: Nose normal.  ?Eyes:  ?   Pupils: Pupils are equal, round, and reactive to light.  ?Cardiovascular:  ?   Comments: HR 80s on monitor.  ?Pulmonary:  ?  Effort: Pulmonary effort is normal.  ?Abdominal:  ?   Comments: Moderate obesity. Some mild bruising.   ?Genitourinary: ?   Comments: Small bore catheter in place with large formed clot in tubing, some SP TTP.  ?Musculoskeletal:  ?   Cervical back: Normal range of motion.  ?   Comments: RLE recent BKA stump c/d/I.   ?Skin: ?   General: Skin is warm.  ?Neurological:  ?   General: No focal deficit present.  ? ? ?BEDSIDE CLOT EVACUATION / BLADDER IRRIGATION: ? ?Using aseptic technique, in situe cather removed and new 25F 3 way hematuria coude catheter placed. 34m water in balloon. Approx 1L of large thick clot and urine evacuated followed by hand irrigation with another 1L NS in 60cc aliquots to clot free / light pink. Connected to NS irrigation at 2 drops per second and placed on gentle catheter traction for prostate tamponade, efflux very light pink. NSG and wife updated about importance of continued irrigation.  ? ? ?Assessment/Plan: ? ?1 - Gross Hematuria / Foley Trauma - present hematuria from foley trauma in setting of anticoagulation. New large bore catheter placed on traction for tamponade and on gentle irrigation to prevent new clot formation. This will take few days to resolve. I feel optimal to hold blood thinner if possible until active bleeding subsides (few days).  ? ?2 - Urinary  Retention / Very Large Prostate - continue finasteride in house and at [place] Will arrange for trial of void in about a month in outpatient setting.  ? ?3 - Widely Metastatic Cancer  - likely CLL. PSA today to v

## 2021-08-24 NOTE — ED Notes (Addendum)
ED TO INPATIENT HANDOFF REPORT ? ?ED Nurse Name and Phone #: Thailyn Khalid RN [phone] ? ?S ?Name/Age/Gender ?Douglas [name] ?71 y.o. ?male ?Room/Bed: 042C/042C ? ?Code Status ?  Code Status: Full Code ? ?Home/SNF/Other ?Home ?Patient oriented to: self, place, time, and situation ?Is this baseline? Yes  ? ?Triage Complete: Triage complete  ?Chief Complaint ?Gross hematuria [R31.0] ? ?Triage Note ?Pt BIB from home fore  hematuria noted in his foley bag since 10pm last night. Foley was replaced yesterday by home health aid. ? ?146/82 ?99HR  ?18RR ?95%  ? ?Allergies ?No Known Allergies ? ?Level of Care/Admitting Diagnosis ?ED Disposition   ? ? ED Disposition  ?Admit  ? Condition  ?--  ? Comment  ?Hospital Area: [facility] [[id number]] ? Level of Care: Telemetry Medical [104] ? May place patient in observation at [facility] or [place] if equivalent level of care is available:: Yes ? Covid Evaluation: Asymptomatic - no recent exposure (last 10 days) testing not required ? Diagnosis: Gross hematuria [599.71.ICD-9-CM] ? Admitting Physician: Karmen Bongo [[id number]] ? Attending Physician: Karmen Bongo [[id number]] ?  ?  ? ?  ? ? ?B ?Medical/Surgery History ?Past Medical History:  ?Diagnosis Date  ? BPH (benign prostatic hyperplasia)   ? CLL (chronic lymphocytic leukemia) ([place])   ? Hypertension   ? Pacemaker   ? Medtronic Device  ? Pulmonary embolism ([place])   ? PVD (peripheral vascular disease) ([place])   ? s/p stent, thrombectomy  ? ?Past Surgical History:  ?Procedure Laterality Date  ? ABDOMINAL AORTOGRAM W/LOWER EXTREMITY Bilateral 06/21/2021  ? Procedure: ABDOMINAL AORTOGRAM W/LOWER EXTREMITY;  Surgeon: Waynetta Sandy, MD;  Location: [place] [facility];  Service: Cardiovascular;  Laterality: Bilateral;  ? ABDOMINAL AORTOGRAM W/LOWER EXTREMITY N/A 08/04/2021  ? Procedure: ABDOMINAL AORTOGRAM W/LOWER EXTREMITY;  Surgeon: Serafina Mitchell, MD;  Location: [place] [facility];  Service: Cardiovascular;  Laterality:  N/A;  ? AMPUTATION Right 06/25/2021  ? Procedure: RIGHT BELOW KNEE AMPUTATION;  Surgeon: Waynetta Sandy, MD;  Location: [place];  Service: Vascular;  Laterality: Right;  ? AMPUTATION Right 07/19/2021  ? Procedure: AMPUTATION ABOVE KNEE;  Surgeon: Angelia Mould, MD;  Location[facility] OR;  Service: Vascular;  Laterality: Right;  ? FEMORAL-POPLITEAL BYPASS GRAFT Right 06/22/2021  ? Procedure: RIGHT FEMORAL-POPLITEAL BYPASS WITH VEIN, RIGHT POPLITEAL THROMBECTOMY;  Surgeon: Waynetta Sandy, MD;  Location: [place];  Service: Vascular;  Laterality: Right;  ? LEFT HEART CATH AND CORONARY ANGIOGRAPHY N/A 01/18/2021  ? Procedure: LEFT HEART CATH AND CORONARY ANGIOGRAPHY;  Surgeon: Burnell Blanks, MD;  Location: [place] [facility];  Service: Cardiovascular;  Laterality: N/A;  ? LOWER EXTREMITY VENOGRAPHY Left 08/06/2021  ? Procedure: LOWER EXTREMITY VENOGRAPHY;  Surgeon: Cherre Robins, MD;  Location: [place] [facility];  Service: Cardiovascular;  Laterality: Left;  ? PACEMAKER IMPLANT N/A 01/19/2021  ? Procedure: PACEMAKER IMPLANT;  Surgeon: Evans Lance, MD;  Location: [place] [facility];  Service: Cardiovascular;  Laterality: N/A;  ? PERIPHERAL VASCULAR INTERVENTION  08/04/2021  ? Procedure: PERIPHERAL VASCULAR INTERVENTION;  Surgeon: Serafina Mitchell, MD;  Location: [place] [facility];  Service: Cardiovascular;;  ? PERIPHERAL VASCULAR THROMBECTOMY  08/06/2021  ? Procedure: PERIPHERAL VASCULAR THROMBECTOMY;  Surgeon: Cherre Robins, MD;  Location: [place] [facility];  Service: Cardiovascular;;  Left Popliteal Vein  ?  ? ?A ?IV Location/Drains/Wounds ?Patient Lines/Drains/Airways Status   ? ? Active Line/Drains/Airways   ? ? Name Placement date Placement time  Site Days  ? Peripheral IV 08/24/21 20 G Anterior;Proximal;Right Forearm 08/24/21  0759  Forearm  less than 1  ? Urethral Catheter Dr Tresa Moore Triple-lumen 24 Fr. 08/24/21  1551  Triple-lumen  less than 1  ? Incision (Closed) 07/19/21 Thigh  Right 07/19/21  1041  -- 36  ? Pressure Injury 08/05/21 Heel Left Stage 1 -  Intact skin with non-blanchable redness of a localized area usually over a bony prominence. redden and blances Foam was on it already. 08/05/21  1930  -- 19  ? Pressure Injury 08/05/21 Buttocks Right;Left;Mid Stage 1 -  Intact skin with non-blanchable redness of a localized area usually over a bony prominence. blanches redden 08/05/21  1930  -- 19  ? Wound / Incision (Open or Dehisced) 08/04/21 Thigh Anterior;Right;Mid unhealed old incision 08/04/21  1939  Thigh  20  ? ?  ?  ? ?  ? ? ?Intake/Output Last 24 hours ? ?Intake/Output Summary (Last 24 hours) at 08/24/2021 1704 ?Last data filed at 08/24/2021 6712 ?Gross per 24 hour  ?Intake 100 ml  ?Output 775 ml  ?Net -675 ml  ? ? ?Labs/Imaging ?Results for orders placed or performed during the hospital encounter of 08/24/21 (from the past 48 hour(s))  ?CBC with Differential     Status: Abnormal  ? Collection Time: 08/24/21  6:01 AM  ?Result Value Ref Range  ? WBC 29.9 (H) 4.0 - 10.5 K/uL  ? RBC 3.94 (L) 4.22 - 5.81 MIL/uL  ? Hemoglobin 10.8 (L) 13.0 - 17.0 g/dL  ? HCT 35.2 (L) 39.0 - 52.0 %  ? MCV 89.3 80.0 - 100.0 fL  ? MCH 27.4 26.0 - 34.0 pg  ? MCHC 30.7 30.0 - 36.0 g/dL  ? RDW 18.5 (H) 11.5 - 15.5 %  ? Platelets 316 150 - 400 K/uL  ? nRBC 0.0 0.0 - 0.2 %  ? Neutrophils Relative % 35 %  ? Neutro Abs 10.6 (H) 1.7 - 7.7 K/uL  ? Lymphocytes Relative 32 %  ? Lymphs Abs 9.6 (H) 0.7 - 4.0 K/uL  ? Monocytes Relative 31 %  ? Monocytes Absolute 9.2 (H) 0.1 - 1.0 K/uL  ? Eosinophils Relative 1 %  ? Eosinophils Absolute 0.2 0.0 - 0.5 K/uL  ? Basophils Relative 0 %  ? Basophils Absolute 0.1 0.0 - 0.1 K/uL  ? WBC Morphology ATYPICAL MONONUCLEAR CELLS   ?  Comment: SMUDGE CELLS ?TO PATHOLOGY FOR REVIEW ?  ? RBC Morphology MORPHOLOGY UNREMARKABLE   ? Smear Review See Note   ?  Comment: SMALL PLATELET CLUMPS PRESENT  ? Immature Granulocytes 1 %  ? Abs Immature Granulocytes 0.14 (H) 0.00 - 0.07 K/uL  ?   Comment: Performed at [facility], [place] [address] [place], [place] [address]  ?Basic metabolic panel     Status: Abnormal  ? Collection Time: 08/24/21  6:01 AM  ?Result Value Ref Range  ? Sodium 132 (L) 135 - 145 mmol/L  ? Potassium 4.0 3.5 - 5.1 mmol/L  ? Chloride 99 98 - 111 mmol/L  ? CO2 24 22 - 32 mmol/L  ? Glucose, Bld 169 (H) 70 - 99 mg/dL  ?  Comment: Glucose reference range applies only to samples taken after fasting for at least 8 hours.  ? BUN 10 8 - 23 mg/dL  ? Creatinine, Ser 0.51 (L) 0.61 - 1.24 mg/dL  ? Calcium 8.7 (L) 8.9 - 10.3 mg/dL  ? GFR, Estimated >60 >60 mL/min  ?  Comment: (NOTE) ?Calculated  using the CKD-EPI Creatinine Equation (2021) ?  ? Anion gap 9 5 - 15  ?  Comment: Performed at [facility], [place] [address] [place], [place] [address]  ?CBC     Status: Abnormal  ? Collection Time: 08/24/21 12:10 PM  ?Result Value Ref Range  ? WBC 39.7 (H) 4.0 - 10.5 K/uL  ? RBC 3.95 (L) 4.22 - 5.81 MIL/uL  ? Hemoglobin 10.7 (L) 13.0 - 17.0 g/dL  ? HCT 35.1 (L) 39.0 - 52.0 %  ? MCV 88.9 80.0 - 100.0 fL  ? MCH 27.1 26.0 - 34.0 pg  ? MCHC 30.5 30.0 - 36.0 g/dL  ? RDW 18.8 (H) 11.5 - 15.5 %  ? Platelets 374 150 - 400 K/uL  ? nRBC 0.0 0.0 - 0.2 %  ?  Comment: Performed at [facility], [place] [address] [place], [place] [address]  ?Type and screen [place]     Status: None  ? Collection Time: 08/24/21 12:10 PM  ?Result Value Ref Range  ? ABO/RH(D) A POS   ? Antibody Screen NEG   ? Sample Expiration    ?  08/27/2021,2359 ?Performed at [facility], [place] [address], [place], [place] [address] ?  ? ?CT HEMATURIA WORKUP ? ?Result Date: 08/24/2021 ?CLINICAL DATA:  71 year old male with history of gross hematuria. EXAM: CT ABDOMEN AND PELVIS WITHOUT AND WITH CONTRAST TECHNIQUE: Multidetector CT imaging of the abdomen and pelvis was performed following the standard protocol before and following the bolus administration of intravenous contrast. RADIATION DOSE REDUCTION: This  exam was performed according to the departmental dose-optimization program which includes automated exposure control, adjustment of the mA and/or kV according to patient size and/or use of iterative reconst

## 2021-08-24 NOTE — Progress Notes (Signed)
Pt admitted to 6N04 from ED with CBI to Foley with bloody red urine and clots noted in tubing, keeping CBI at a fairly fast rate to maintain urine at a rose color. Wife at bedside and is planning to stay. Pt nauseated and dry heaving, asked for additional order for nausea med-Compazine ordered and given. Pt states not having any pain at present. Pt lives at home with wife and has home health. Pt is alert and oriented.  ?

## 2021-08-24 NOTE — ED Notes (Signed)
Attempted to flush catheter with sterile water, able to flush and pull back same amount but clots are in bladder not allowing all to drain and patient develops bladder spasms when bladder starts to fill.   ?

## 2021-08-24 NOTE — H&P (Signed)
?History and Physical  ? ? ?Patient: Douglas [name] STM:[id number] DOB: 08-Jun-1950 ?DOA: 08/24/2021 ?DOS: the patient was seen and examined on 08/24/2021 ?PCP: [name], MD  ?Patient coming from: Home - lives with wife; NOK: Wife, Douglas [name], [phone] ? ? ?Chief Complaint: Hematuria ? ?HPI: [name] is a 71 y.o. male with medical history significant of BPH; CLL; and HTN presenting with gross hematuria.  He was discharged from the hospital with a foley, has been there for a while.  His home nurse thought it needed to be changed out to prevent infection.  She tried to change it yesterday AM.  She didn't realize it was in the prostate and curling up.  He didn't have UOP for hours.  She changed it again with success and he had normal urine until about 10pm and it has been bleeding significantly since.  Right now, he is having significant burning in the tip of his penis.  He has the sensation that he wants to pee.  It causes him to feel hot and sweaty and it makes him feel SOB. ? ?He was last admitted from 4/25-28 with hyponatremia and limb ischemia.  He underwent angiogram and thrombectomy and was started on ASA and Plavix as well as Eliquis. ? ? ? ?ER Course:  Off meds, recent thrombectomy and on Eliquis.  Indwelling foley, tried to replace unsuccessfully.  Eventually replaced and bleeding from foley.  Irrigation attempted.  Dr. Tresa Moore will consult, will use urology cart. ? ? ? ? ?Review of Systems: As mentioned in the history of present illness. All other systems reviewed and are negative. ?Past Medical History:  ?Diagnosis Date  ? BPH (benign prostatic hyperplasia)   ? CLL (chronic lymphocytic leukemia) ([place])   ? Hypertension   ? Pacemaker   ? Medtronic Device  ? Pulmonary embolism ([place])   ? PVD (peripheral vascular disease) ([place])   ? s/p stent, thrombectomy  ? ?Past Surgical History:  ?Procedure Laterality Date  ? ABDOMINAL AORTOGRAM W/LOWER EXTREMITY Bilateral 06/21/2021  ? Procedure: ABDOMINAL  AORTOGRAM W/LOWER EXTREMITY;  Surgeon: Waynetta Sandy, MD;  Location: [place] [facility];  Service: Cardiovascular;  Laterality: Bilateral;  ? ABDOMINAL AORTOGRAM W/LOWER EXTREMITY N/A 08/04/2021  ? Procedure: ABDOMINAL AORTOGRAM W/LOWER EXTREMITY;  Surgeon: Serafina Mitchell, MD;  Location: [place] [facility];  Service: Cardiovascular;  Laterality: N/A;  ? AMPUTATION Right 06/25/2021  ? Procedure: RIGHT BELOW KNEE AMPUTATION;  Surgeon: Waynetta Sandy, MD;  Location: [place];  Service: Vascular;  Laterality: Right;  ? AMPUTATION Right 07/19/2021  ? Procedure: AMPUTATION ABOVE KNEE;  Surgeon: Angelia Mould, MD;  Location: [facility] OR;  Service: Vascular;  Laterality: Right;  ? FEMORAL-POPLITEAL BYPASS GRAFT Right 06/22/2021  ? Procedure: RIGHT FEMORAL-POPLITEAL BYPASS WITH VEIN, RIGHT POPLITEAL THROMBECTOMY;  Surgeon: Waynetta Sandy, MD;  Location: [place];  Service: Vascular;  Laterality: Right;  ? LEFT HEART CATH AND CORONARY ANGIOGRAPHY N/A 01/18/2021  ? Procedure: LEFT HEART CATH AND CORONARY ANGIOGRAPHY;  Surgeon: Burnell Blanks, MD;  Location: [place] [facility];  Service: Cardiovascular;  Laterality: N/A;  ? LOWER EXTREMITY VENOGRAPHY Left 08/06/2021  ? Procedure: LOWER EXTREMITY VENOGRAPHY;  Surgeon: Cherre Robins, MD;  Location: [place] [facility];  Service: Cardiovascular;  Laterality: Left;  ? PACEMAKER IMPLANT N/A 01/19/2021  ? Procedure: PACEMAKER IMPLANT;  Surgeon: Evans Lance, MD;  Location: [place] [facility];  Service: Cardiovascular;  Laterality: N/A;  ? PERIPHERAL VASCULAR INTERVENTION  08/04/2021  ? Procedure:  PERIPHERAL VASCULAR INTERVENTION;  Surgeon: Serafina Mitchell, MD;  Location: [place] [facility];  Service: Cardiovascular;;  ? PERIPHERAL VASCULAR THROMBECTOMY  08/06/2021  ? Procedure: PERIPHERAL VASCULAR THROMBECTOMY;  Surgeon: Cherre Robins, MD;  Location: [place] [facility];  Service: Cardiovascular;;  Left Popliteal Vein  ? ?Social History:   reports that he has quit smoking. His smoking use included cigarettes. He has a 5.00 pack-year smoking history. He has never used smokeless tobacco. He reports that he does not currently use alcohol. He reports that he does not use drugs. ? ?No Known Allergies ? ?Family History  ?Problem Relation Age of Onset  ? Lung cancer Mother   ? Coronary artery disease Father   ? ? ?Prior to Admission medications   ?Medication Sig Start Date End Date Taking? Authorizing Provider  ?albuterol (PROVENTIL) (2.5 MG/3ML) 0.083% nebulizer solution Take 2.5 mg by nebulization every 6 (six) hours as needed for wheezing or shortness of breath.    [provider]  ?albuterol (VENTOLIN HFA) 108 (90 Base) MCG/ACT inhaler Inhale 2 puffs into the lungs 4 (four) times daily. 07/15/21   Angiulli, Lavon Paganini, PA-C  ?amiodarone (PACERONE) 200 MG tablet Take 2 tablets (400 mg total) by mouth 2 (two) times daily. 08/10/21   Ulyses Amor, PA-C  ?amLODipine (NORVASC) 10 MG tablet Take 1 tablet (10 mg total) by mouth daily. 07/15/21   Angiulli, Lavon Paganini, PA-C  ?apixaban (ELIQUIS) 5 MG TABS tablet Take 1 tablet (5 mg total) by mouth 2 (two) times daily. Starting after the completion of the Eliquis VTE Starter Pack. 09/16/21   Waynetta Sandy, MD  ?APIXABAN [place]) VTE STARTER PACK ('10MG'$  AND '5MG'$ ) Take as directed on package: start with two-'5mg'$  tablets twice daily for 6 days (thru 08-16-21). On day 08-17-21 switch to one-'5mg'$  tablet twice daily. 08/10/21   Waynetta Sandy, MD  ?ascorbic acid (VITAMIN C) 1000 MG tablet Take 1 tablet (1,000 mg total) by mouth daily. 07/15/21   Angiulli, Lavon Paganini, PA-C  ?aspirin EC 81 MG EC tablet Take 1 tablet (81 mg total) by mouth daily. Swallow whole. ?Patient not taking: Reported on 08/03/2021 07/02/21   Little Ishikawa, MD  ?clopidogrel (PLAVIX) 75 MG tablet Take 1 tablet (75 mg total) by mouth daily with breakfast. 08/10/21   Ulyses Amor, PA-C  ?docusate sodium (COLACE) 100 MG capsule Take 1  capsule (100 mg total) by mouth 2 (two) times daily. ?Patient taking differently: Take 100 mg by mouth daily. 07/15/21   Angiulli, Lavon Paganini, PA-C  ?finasteride (PROSCAR) 5 MG tablet Take 1 tablet (5 mg total) by mouth daily. 07/15/21   Angiulli, Lavon Paganini, PA-C  ?gabapentin (NEURONTIN) 300 MG capsule Take 1 capsule (300 mg total) by mouth at bedtime. 07/15/21   Angiulli, Lavon Paganini, PA-C  ?IMBRUVICA 420 MG tablet TAKE 1 TABLET BY MOUTH ONCE  DAILY WITH A FULL GLASS OF WATER 08/09/21   Wyatt Portela, MD  ?melatonin 5 MG TABS Take 1 tablet (5 mg total) by mouth at bedtime. 07/15/21   Angiulli, Lavon Paganini, PA-C  ?methocarbamol (ROBAXIN) 500 MG tablet Take 1 tablet (500 mg total) by mouth 3 (three) times daily. ?Patient taking differently: Take 500 mg by mouth every 8 (eight) hours as needed for muscle spasms. 07/15/21   Angiulli, Lavon Paganini, PA-C  ?montelukast (SINGULAIR) 10 MG tablet Take 1 tablet (10 mg total) by mouth daily as needed (allergies). ?Patient not taking: Reported on 08/03/2021 07/15/21   [place],  Lavon Paganini, PA-C  ?Multiple Vitamin (MULTIVITAMIN WITH MINERALS) TABS tablet Take 1 tablet by mouth daily. ?Patient not taking: Reported on 08/03/2021 07/15/21   Cathlyn Parsons, PA-C  ?oxyCODONE (OXY IR/ROXICODONE) 5 MG immediate release tablet Take 1 tablet (5 mg total) by mouth every 4 (four) hours as needed for moderate pain. 07/15/21   Angiulli, Lavon Paganini, PA-C  ?oxymetazoline (AFRIN) 0.05 % nasal spray Place 1 spray into both nostrils 2 (two) times daily as needed for congestion.    [provider]  ?pantoprazole (PROTONIX) 40 MG tablet Take 1 tablet (40 mg total) by mouth 2 (two) times daily. ?Patient not taking: Reported on 08/03/2021 07/15/21   Cathlyn Parsons, PA-C  ?polyethylene glycol (MIRALAX / GLYCOLAX) 17 g packet Take 17 g by mouth 2 (two) times daily. ?Patient not taking: Reported on 08/03/2021 07/15/21   Cathlyn Parsons, PA-C  ?rosuvastatin (CRESTOR) 20 MG tablet Take 1 tablet (20 mg total) by mouth daily.  08/10/21   Ulyses Amor, PA-C  ?terazosin (HYTRIN) 5 MG capsule Take 3 capsules (15 mg total) by mouth at bedtime. ?Patient taking differently: Take 15 mg by mouth in the morning. 07/15/21   Lauraine Rinne

## 2021-08-24 NOTE — ED Triage Notes (Signed)
Pt BIB from home fore  hematuria noted in his foley bag since 10pm last night. Foley was replaced yesterday by home health aid. ? ?146/82 ?99HR  ?18RR ?95% ?

## 2021-08-24 NOTE — ED Notes (Signed)
Patient continues to be pale, diaphoretic, complaining of being hot.  Patient is not tachy at this time but is tachypneic.  Placed on oxygen due to desat with dilaudid. Urology cart at bedside.  ?

## 2021-08-24 NOTE — ED Notes (Signed)
Pt's foley irrigated. Clots noted to be removed through irrigation. Foley continues to drain freely post irrigation. Blood still noted. ?

## 2021-08-24 NOTE — ED Provider Notes (Signed)
?[facility] ?Provider Note ? ?CSN: [id number] ?Arrival date & time: 08/24/21 2841 ? ?Chief Complaint(s) ?Hematuria (From foley ) ? ?HPI ?Douglas [name] is a 71 y.o. male with a past medical history listed below including CLL, prior PEs on Eliquis, BPH requiring Foley catheter who presents to the emergency department with hematuria.  Patient reports that the catheter was changed yesterday by home nurse.  They had difficulty inserting it and it apparently coiled in the urethra.  Patient was not producing any urine and nurse was called back later on in the afternoon.  They were able to appropriately place the Foley the subsequent time which retrieved clear urine.  He reports the last night he began having hematuria. ? ? ? ? ?Hematuria ? ? ?Past Medical History ?Past Medical History:  ?Diagnosis Date  ? BPH (benign prostatic hyperplasia)   ? CLL (chronic lymphocytic leukemia) ([place])   ? Hypertension   ? Pacemaker   ? Medtronic Device  ? ?Patient Active Problem List  ? Diagnosis Date Noted  ? Pressure injury of skin 08/08/2021  ? Acute pulmonary embolism with acute cor pulmonale ([place]) 08/08/2021  ? S/P AKA (above knee amputation) unilateral, right ([place]) 08/08/2021  ? SVT (supraventricular tachycardia) ([place])   ? PAD (peripheral artery disease) ([place])   ? Post-op pain   ? Urine retention   ? Right below-knee amputee ([place]) 07/01/2021  ? Malnutrition of moderate degree 06/28/2021  ? Hypokalemia 06/25/2021  ? Constipation 06/24/2021  ? Physical debility 06/23/2021  ? Coffee ground emesis 06/22/2021  ? Critical limb ischemia of right lower extremity ([place]) 06/18/2021  ? Pacemaker 04/30/2021  ? Elevated troponin   ? Mobitz type 1 second degree AV block   ? CLL (chronic lymphocytic leukemia) ([place]) 01/16/2021  ? NSTEMI (non-ST elevated myocardial infarction) ([place]) 01/16/2021  ? Hypertension 01/16/2021  ? Diabetes mellitus ([place]) 01/16/2021  ? BPH (benign prostatic hyperplasia) 01/16/2021   ? ?Home Medication(s) ?Prior to Admission medications   ?Medication Sig Start Date End Date Taking? Authorizing Provider  ?albuterol (PROVENTIL) (2.5 MG/3ML) 0.083% nebulizer solution Take 2.5 mg by nebulization every 6 (six) hours as needed for wheezing or shortness of breath.    [provider]  ?albuterol (VENTOLIN HFA) 108 (90 Base) MCG/ACT inhaler Inhale 2 puffs into the lungs 4 (four) times daily. 07/15/21   Angiulli, Lavon Paganini, PA-C  ?amiodarone (PACERONE) 200 MG tablet Take 2 tablets (400 mg total) by mouth 2 (two) times daily. 08/10/21   Ulyses Amor, PA-C  ?amLODipine (NORVASC) 10 MG tablet Take 1 tablet (10 mg total) by mouth daily. 07/15/21   Angiulli, Lavon Paganini, PA-C  ?apixaban (ELIQUIS) 5 MG TABS tablet Take 1 tablet (5 mg total) by mouth 2 (two) times daily. Starting after the completion of the Eliquis VTE Starter Pack. 09/16/21   Waynetta Sandy, MD  ?APIXABAN [place]) VTE STARTER PACK ('10MG'$  AND '5MG'$ ) Take as directed on package: start with two-'5mg'$  tablets twice daily for 6 days (thru 08-16-21). On day 08-17-21 switch to one-'5mg'$  tablet twice daily. 08/10/21   Waynetta Sandy, MD  ?ascorbic acid (VITAMIN C) 1000 MG tablet Take 1 tablet (1,000 mg total) by mouth daily. 07/15/21   Angiulli, Lavon Paganini, PA-C  ?aspirin EC 81 MG EC tablet Take 1 tablet (81 mg total) by mouth daily. Swallow whole. ?Patient not taking: Reported on 08/03/2021 07/02/21   Little Ishikawa, MD  ?clopidogrel (PLAVIX) 75 MG tablet Take 1 tablet (75 mg total) by  mouth daily with breakfast. 08/10/21   Ulyses Amor, PA-C  ?docusate sodium (COLACE) 100 MG capsule Take 1 capsule (100 mg total) by mouth 2 (two) times daily. ?Patient taking differently: Take 100 mg by mouth daily. 07/15/21   Angiulli, Lavon Paganini, PA-C  ?finasteride (PROSCAR) 5 MG tablet Take 1 tablet (5 mg total) by mouth daily. 07/15/21   Angiulli, Lavon Paganini, PA-C  ?gabapentin (NEURONTIN) 300 MG capsule Take 1 capsule (300 mg total) by mouth at bedtime. 07/15/21    Angiulli, Lavon Paganini, PA-C  ?IMBRUVICA 420 MG tablet TAKE 1 TABLET BY MOUTH ONCE  DAILY WITH A FULL GLASS OF WATER 08/09/21   Wyatt Portela, MD  ?melatonin 5 MG TABS Take 1 tablet (5 mg total) by mouth at bedtime. 07/15/21   Angiulli, Lavon Paganini, PA-C  ?methocarbamol (ROBAXIN) 500 MG tablet Take 1 tablet (500 mg total) by mouth 3 (three) times daily. ?Patient taking differently: Take 500 mg by mouth every 8 (eight) hours as needed for muscle spasms. 07/15/21   Angiulli, Lavon Paganini, PA-C  ?montelukast (SINGULAIR) 10 MG tablet Take 1 tablet (10 mg total) by mouth daily as needed (allergies). ?Patient not taking: Reported on 08/03/2021 07/15/21   Cathlyn Parsons, PA-C  ?Multiple Vitamin (MULTIVITAMIN WITH MINERALS) TABS tablet Take 1 tablet by mouth daily. ?Patient not taking: Reported on 08/03/2021 07/15/21   Cathlyn Parsons, PA-C  ?oxyCODONE (OXY IR/ROXICODONE) 5 MG immediate release tablet Take 1 tablet (5 mg total) by mouth every 4 (four) hours as needed for moderate pain. 07/15/21   Angiulli, Lavon Paganini, PA-C  ?oxymetazoline (AFRIN) 0.05 % nasal spray Place 1 spray into both nostrils 2 (two) times daily as needed for congestion.    [provider]  ?pantoprazole (PROTONIX) 40 MG tablet Take 1 tablet (40 mg total) by mouth 2 (two) times daily. ?Patient not taking: Reported on 08/03/2021 07/15/21   Cathlyn Parsons, PA-C  ?polyethylene glycol (MIRALAX / GLYCOLAX) 17 g packet Take 17 g by mouth 2 (two) times daily. ?Patient not taking: Reported on 08/03/2021 07/15/21   Cathlyn Parsons, PA-C  ?rosuvastatin (CRESTOR) 20 MG tablet Take 1 tablet (20 mg total) by mouth daily. 08/10/21   Ulyses Amor, PA-C  ?terazosin (HYTRIN) 5 MG capsule Take 3 capsules (15 mg total) by mouth at bedtime. ?Patient taking differently: Take 15 mg by mouth in the morning. 07/15/21   Angiulli, Lavon Paganini, PA-C  ?valsartan-hydrochlorothiazide (DIOVAN-HCT) 320-25 MG tablet Take 1 tablet by mouth daily.    [provider]  ?                                                                                                                                   ?Allergies ?Patient has no known allergies. ? ?Review of Systems ?Review of Systems  ?Genitourinary:  Positive for hematuria.  ?As noted in HPI ? ?Physical Exam ?Vital Signs  ?I have  reviewed the triage vital signs ?BP (!) 152/77   Pulse 91   Temp 97.8 ?F (36.6 ?C) (Oral)   Resp 18   Ht 6' (1.829 m)   Wt 99.8 kg   SpO2 97%   BMI 29.84 kg/m?  ? ?Physical Exam ?Vitals reviewed.  ?Constitutional:   ?   General: He is not in acute distress. ?   Appearance: He is well-developed. He is not diaphoretic.  ?HENT:  ?   Head: Normocephalic and atraumatic.  ?   Right Ear: External ear normal.  ?   Left Ear: External ear normal.  ?   Nose: Nose normal.  ?   Mouth/Throat:  ?   Mouth: Mucous membranes are moist.  ?Eyes:  ?   General: No scleral icterus. ?   Conjunctiva/sclera: Conjunctivae normal.  ?Neck:  ?   Trachea: Phonation normal.  ?Cardiovascular:  ?   Rate and Rhythm: Normal rate and regular rhythm.  ?Pulmonary:  ?   Effort: Pulmonary effort is normal. No respiratory distress.  ?   Breath sounds: No stridor.  ?Abdominal:  ?   General: There is no distension.  ?   Tenderness: There is no abdominal tenderness.  ?Genitourinary: ?   Penis: Uncircumcised.   ?   Comments: Foley with dark bloody urine. Blood leaking around foley. ?Scrotal swelling that he reports has been there for 2 weeks since thrombectomy of LLE.  ?Musculoskeletal:     ?   General: Normal range of motion.  ?   Cervical back: Normal range of motion.  ?Neurological:  ?   Mental Status: He is alert and oriented to person, place, and time.  ?Psychiatric:     ?   Behavior: Behavior normal.  ? ? ?ED Results and Treatments ?Labs ?(all labs ordered are listed, but only abnormal results are displayed) ?Labs Reviewed  ?CBC WITH DIFFERENTIAL/PLATELET - Abnormal; Notable for the following components:  ?    Result Value  ? WBC 29.9 (*)   ? RBC 3.94 (*)   ?  Hemoglobin 10.8 (*)   ? HCT 35.2 (*)   ? RDW 18.5 (*)   ? Neutro Abs 10.6 (*)   ? Lymphs Abs 9.6 (*)   ? Monocytes Absolute 9.2 (*)   ? Abs Immature Granulocytes 0.14 (*)   ? All other components within normal lim

## 2021-08-24 NOTE — ED Notes (Signed)
Patient had episode of SVT in 140's with Dr Lorin Mercy at bedside.  Patient became diaphoretic and dizzy.  EKG captured as HR was coming down.  Patient now in vent rhythm with frequent PVCs.  ?

## 2021-08-24 NOTE — ED Notes (Signed)
Dr Tresa Moore put in 3 way catheter and has CBI fluids irrigating bladder.  CUrrently red fluid noted in tubing. ?

## 2021-08-25 ENCOUNTER — Other Ambulatory Visit (HOSPITAL_COMMUNITY): Payer: Self-pay

## 2021-08-25 DIAGNOSIS — I2693 Single subsegmental pulmonary embolism without acute cor pulmonale: Secondary | ICD-10-CM | POA: Diagnosis not present

## 2021-08-25 DIAGNOSIS — Z801 Family history of malignant neoplasm of trachea, bronchus and lung: Secondary | ICD-10-CM | POA: Diagnosis not present

## 2021-08-25 DIAGNOSIS — E1151 Type 2 diabetes mellitus with diabetic peripheral angiopathy without gangrene: Secondary | ICD-10-CM | POA: Diagnosis present

## 2021-08-25 DIAGNOSIS — T8383XA Hemorrhage of genitourinary prosthetic devices, implants and grafts, initial encounter: Secondary | ICD-10-CM | POA: Diagnosis present

## 2021-08-25 DIAGNOSIS — I471 Supraventricular tachycardia: Secondary | ICD-10-CM

## 2021-08-25 DIAGNOSIS — C9110 Chronic lymphocytic leukemia of B-cell type not having achieved remission: Secondary | ICD-10-CM

## 2021-08-25 DIAGNOSIS — I10 Essential (primary) hypertension: Secondary | ICD-10-CM

## 2021-08-25 DIAGNOSIS — E119 Type 2 diabetes mellitus without complications: Secondary | ICD-10-CM

## 2021-08-25 DIAGNOSIS — Z7982 Long term (current) use of aspirin: Secondary | ICD-10-CM | POA: Diagnosis not present

## 2021-08-25 DIAGNOSIS — I739 Peripheral vascular disease, unspecified: Secondary | ICD-10-CM

## 2021-08-25 DIAGNOSIS — D62 Acute posthemorrhagic anemia: Secondary | ICD-10-CM | POA: Diagnosis present

## 2021-08-25 DIAGNOSIS — L89321 Pressure ulcer of left buttock, stage 1: Secondary | ICD-10-CM | POA: Diagnosis present

## 2021-08-25 DIAGNOSIS — Z8249 Family history of ischemic heart disease and other diseases of the circulatory system: Secondary | ICD-10-CM | POA: Diagnosis not present

## 2021-08-25 DIAGNOSIS — I2699 Other pulmonary embolism without acute cor pulmonale: Secondary | ICD-10-CM | POA: Diagnosis not present

## 2021-08-25 DIAGNOSIS — Z950 Presence of cardiac pacemaker: Secondary | ICD-10-CM | POA: Diagnosis not present

## 2021-08-25 DIAGNOSIS — R310 Gross hematuria: Secondary | ICD-10-CM | POA: Diagnosis present

## 2021-08-25 DIAGNOSIS — Z86711 Personal history of pulmonary embolism: Secondary | ICD-10-CM | POA: Diagnosis not present

## 2021-08-25 DIAGNOSIS — I749 Embolism and thrombosis of unspecified artery: Secondary | ICD-10-CM

## 2021-08-25 DIAGNOSIS — N401 Enlarged prostate with lower urinary tract symptoms: Secondary | ICD-10-CM | POA: Diagnosis present

## 2021-08-25 DIAGNOSIS — Z89611 Acquired absence of right leg above knee: Secondary | ICD-10-CM | POA: Diagnosis not present

## 2021-08-25 DIAGNOSIS — E871 Hypo-osmolality and hyponatremia: Secondary | ICD-10-CM | POA: Diagnosis present

## 2021-08-25 DIAGNOSIS — Z86718 Personal history of other venous thrombosis and embolism: Secondary | ICD-10-CM | POA: Diagnosis not present

## 2021-08-25 DIAGNOSIS — L89311 Pressure ulcer of right buttock, stage 1: Secondary | ICD-10-CM | POA: Diagnosis present

## 2021-08-25 DIAGNOSIS — R338 Other retention of urine: Secondary | ICD-10-CM | POA: Diagnosis present

## 2021-08-25 DIAGNOSIS — Z79899 Other long term (current) drug therapy: Secondary | ICD-10-CM | POA: Diagnosis not present

## 2021-08-25 DIAGNOSIS — Z87891 Personal history of nicotine dependence: Secondary | ICD-10-CM | POA: Diagnosis not present

## 2021-08-25 DIAGNOSIS — Z7902 Long term (current) use of antithrombotics/antiplatelets: Secondary | ICD-10-CM | POA: Diagnosis not present

## 2021-08-25 DIAGNOSIS — R319 Hematuria, unspecified: Secondary | ICD-10-CM | POA: Diagnosis present

## 2021-08-25 DIAGNOSIS — Z7901 Long term (current) use of anticoagulants: Secondary | ICD-10-CM | POA: Diagnosis not present

## 2021-08-25 DIAGNOSIS — Y846 Urinary catheterization as the cause of abnormal reaction of the patient, or of later complication, without mention of misadventure at the time of the procedure: Secondary | ICD-10-CM | POA: Diagnosis present

## 2021-08-25 DIAGNOSIS — S3739XA Other injury of urethra, initial encounter: Secondary | ICD-10-CM | POA: Diagnosis present

## 2021-08-25 LAB — CBC
HCT: 27.7 % — ABNORMAL LOW (ref 39.0–52.0)
Hemoglobin: 8.6 g/dL — ABNORMAL LOW (ref 13.0–17.0)
MCH: 27.7 pg (ref 26.0–34.0)
MCHC: 31 g/dL (ref 30.0–36.0)
MCV: 89.4 fL (ref 80.0–100.0)
Platelets: 349 10*3/uL (ref 150–400)
RBC: 3.1 MIL/uL — ABNORMAL LOW (ref 4.22–5.81)
RDW: 18.6 % — ABNORMAL HIGH (ref 11.5–15.5)
WBC: 42.1 10*3/uL — ABNORMAL HIGH (ref 4.0–10.5)
nRBC: 0 % (ref 0.0–0.2)

## 2021-08-25 LAB — BASIC METABOLIC PANEL
Anion gap: 7 (ref 5–15)
BUN: 18 mg/dL (ref 8–23)
CO2: 26 mmol/L (ref 22–32)
Calcium: 8.8 mg/dL — ABNORMAL LOW (ref 8.9–10.3)
Chloride: 98 mmol/L (ref 98–111)
Creatinine, Ser: 0.57 mg/dL — ABNORMAL LOW (ref 0.61–1.24)
GFR, Estimated: >60 mL/min (ref >60)
Glucose, Bld: 114 mg/dL — ABNORMAL HIGH (ref 70–99)
Potassium: 4.6 mmol/L (ref 3.5–5.1)
Sodium: 131 mmol/L — ABNORMAL LOW (ref 135–145)

## 2021-08-25 LAB — PATHOLOGIST SMEAR REVIEW

## 2021-08-25 MED ORDER — SENNOSIDES-DOCUSATE SODIUM 8.6-50 MG PO TABS
2.0000 | ORAL_TABLET | Freq: Two times a day (BID) | ORAL | Status: DC
Start: 2021-08-25 — End: 2021-09-02
  Administered 2021-08-25 – 2021-09-01 (×15): 2 via ORAL
  Filled 2021-08-25 (×16): qty 2

## 2021-08-25 MED ORDER — DOCUSATE SODIUM 100 MG PO CAPS
100.0000 mg | ORAL_CAPSULE | Freq: Two times a day (BID) | ORAL | Status: DC
  Administered 2021-08-25 – 2021-09-01 (×15): 100 mg via ORAL
  Filled 2021-08-25 (×15): qty 1

## 2021-08-25 MED ORDER — OXYCODONE-ACETAMINOPHEN 5-325 MG PO TABS
1.0000 | ORAL_TABLET | ORAL | Status: DC | PRN
  Administered 2021-08-25: 2 via ORAL
  Filled 2021-08-25: qty 2

## 2021-08-25 MED ORDER — SODIUM CHLORIDE 0.9 % IV SOLN
1.0000 g | INTRAVENOUS | Status: AC
  Administered 2021-08-25 – 2021-08-26 (×2): 1 g via INTRAVENOUS
  Filled 2021-08-25 (×2): qty 10

## 2021-08-25 MED ORDER — SODIUM CHLORIDE 0.9 % IV SOLN: INTRAVENOUS | Status: DC

## 2021-08-25 MED ORDER — METOPROLOL TARTRATE 5 MG/5ML IV SOLN: 2.5000 mg | Freq: Three times a day (TID) | INTRAVENOUS | Status: DC | PRN

## 2021-08-25 MED ORDER — HEPARIN (PORCINE) 25000 UT/250ML-% IV SOLN
1600.0000 [IU]/h | INTRAVENOUS | Status: DC
  Administered 2021-08-25: 1400 [IU]/h via INTRAVENOUS
  Filled 2021-08-25: qty 250

## 2021-08-25 MED ORDER — SODIUM CHLORIDE 0.9 % IR SOLN
3000.0000 mL | Status: DC
  Administered 2021-08-24 – 2021-08-27 (×5): 3000 mL

## 2021-08-25 NOTE — Progress Notes (Signed)
? ?Subjective/Chief Complaint: ? ?1 - Gross Hematuria / Foley Trauma - new gross hematuria with clots noted after home health trauamtic foley change 08/23/21. He is on blood thinners for DVT/PE/PAD. CT 5/16 with large prostate and diffuse adenopathy. No renal lesions / stones. Improving dramatically on large catheter with irrigation and traction.  ?  ?2 - Urinary Retention / Very Large Prostate - foley dependant since 06/2030 for presuemd BPH per history. NO recent cysto / GU eval. On alpha blockers at baseline x years, finasteride added 07/2021. Prostate vol 57m with median lobe by CT 08/2021. ?  ?3 - Widely Metastatic Cancer - known CLL. Diffuse adenopathy including pelvic on CT 08/2021, PSA 8 (not consistent with prostate source cancer).  ?  ?Today "Douglas [name] is seen improving. Near complete resolution of gross hematuria. Hgb down some as anticipated at 8.7, Cr stable.  ?  ? ?Objective: ?Vital signs in last 24 hours: ?Temp:  [97.5 ?F (36.4 ?C)-98.2 ?F (36.8 ?C)] 98.2 ?F (36.8 ?C) (05/17 0724) ?Pulse Rate:  [45-98] 98 (05/17 0724) ?Resp:  [8-17] 16 (05/17 0724) ?BP: (109-145)/(60-81) 145/66 (05/17 0724) ?SpO2:  [95 %-100 %] 97 % (05/17 0724) ?Last BM Date : 08/24/21 ? ?Intake/Output from previous day: ?05/16 0701 - 05/17 0700 ?In: 155732[P.O.:120; I.V.:760; IV Piggyback:100] ?Out: 120254[[[id number]]?Intake/Output this shift: ?Total I/O ?In: 714.3 [I.V.:614.3; IV Piggyback:100] ?Out: 1000 [Urine:1000] ? ?EXAM: ?NAD, wife at bedside, both very pleasant ?Non-labored breathing on RA ?Stable mild truncal obesity. ?Drastically improved hematuria, now minimal on very slow bladder irrigation, leg traction exchanged for cath straps for comfort. ?Stable RLE BKA stump.  ? ?Lab Results:  ?Recent Labs  ?  08/24/21 ?1210 08/25/21 ?0512  ?WBC 39.7* 42.1*  ?HGB 10.7* 8.6*  ?HCT 35.1* 27.7*  ?PLT 374 349  ? ?BMET ?Recent Labs  ?  08/24/21 ?0601 08/25/21 ?0512  ?NA 132* 131*  ?K 4.0 4.6  ?CL 99 98  ?CO2 24 26  ?GLUCOSE 169* 114*  ?BUN  10 18  ?CREATININE 0.51* 0.57*  ?CALCIUM 8.7* 8.8*  ? ?PT/INR ?No results for input(s): LABPROT, INR in the last 72 hours. ?ABG ?No results for input(s): PHART, HCO3 in the last 72 hours. ? ?Invalid input(s): PCO2, PO2 ? ?Studies/Results: ?CT HEMATURIA WORKUP ? ?Result Date: 08/24/2021 ?CLINICAL DATA:  71year old male with history of gross hematuria. EXAM: CT ABDOMEN AND PELVIS WITHOUT AND WITH CONTRAST TECHNIQUE: Multidetector CT imaging of the abdomen and pelvis was performed following the standard protocol before and following the bolus administration of intravenous contrast. RADIATION DOSE REDUCTION: This exam was performed according to the departmental dose-optimization program which includes automated exposure control, adjustment of the mA and/or kV according to patient size and/or use of iterative reconstruction technique. CONTRAST:  1091mOMNIPAQUE IOHEXOL 300 MG/ML  SOLN COMPARISON:  No priors. FINDINGS: Lower chest: Atherosclerotic calcifications in the descending thoracic aorta. Pacemaker leads in the right atrium and right ventricle. 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 No other suspicious hepatic lesions are noted. No intra or extrahepatic biliary ductal dilatation. Tiny calcified gallstones lying dependently in  the gallbladder. Gallbladder is moderately distended. Gallbladder wall thickness is normal. No pericholecystic fluid or surrounding inflammatory changes. Pancreas: Numerous coarse calcifications are noted throughout  the head and body of the pancreas, likely sequela of prior episodes of pancreatitis. No discrete pancreatic mass. No pancreatic ductal dilatation. No peripancreatic fluid collections or inflammatory changes. Spleen: Partially calcified mass in the spleen measuring 6.8 x 5.1 cm (axial image 17 of series 4), with little to no internal enhancement on postcontrast imaging. Adrenals/Urinary Tract: No calcifications are identified within the collecting system of either kidney, along the course of either ureter, or within the lumen of the urinary bladder. Subcentimeter low-attenuation lesion in the medial aspect of the lower pole of the left kidney, too small to characterize, but statistically likely to represent a tiny cyst. No aggressive appearing renal lesions. No hydroureteronephrosis. Foley balloon catheter within the lumen of the urinary bladder, with the tip imbedded in a mass-like collection of material that is estimated to measure approximately 9.0 x 7.8 x 7.9 cm (axial image 73 of series 9 and coronal image 58 of series 12) which is generally high attenuation (65 HU), likely to represent a large bladder hematoma. Non dependently within the presumed hematoma there are several locules of gas. There is also gas non dependently in the urinary bladder, presumably iatrogenic from the indwelling Foley. Notably, along the posterior aspect of the urinary bladder there is some low-attenuation material which is mass-like in appearance on precontrast images and demonstrates heterogeneous post-contrast enhancement, best appreciated on axial images 77 of series 9 and 77 of series 4, estimated to measure approximately 3.2 x 3.2 cm. Whether this represents a bladder mass or median lobe hypertrophy from the prostate gland is uncertain. Right adrenal gland is normal. 1.7 cm left adrenal nodule (axial image 24 of series 4), indeterminate. Stomach/Bowel: The appearance of the stomach is normal. There is no pathologic dilatation of  small bowel or colon. Soft tissue stranding in the presacral space where there is an enhancing soft tissue nodule slightly to the right of midline (axial image 74 of series 4) measuring 1.0 x 0.9 cm. The appendix is not confidently identified and may be surgically absent. Regardless, there are no inflammatory changes noted adjacent to the cecum to suggest the presence of an acute appendicitis at this time. 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 Filling defect in the right profunda femoral vein, right external iliac vein and right common iliac vein, concerning for deep venous thrombosis. Extensive lymphadenopathy is noted throughout the abdomen and pelvis. Specific examples include an enlarged hepatoduodenal ligament lymph node measuring 1.4 cm in short axis (axial image 23 of series 4), enlarged para-aortic lymph nodes bilaterally measuring up to 1.8 cm in short axis (axial image 46 of series 4). Common iliac nodes bilaterally measuring up to 1.8 cm in short axis on the right (axial image 55 of series 4), external iliac lymph nodes and obturator lymph nodes bilaterally measuring up to 1.6 cm in short axis on the left, and inguinal lymph nodes bilaterally measuring up to 2.3 cm on the right (axial image 76 of series 4). Reproductive: Potential median lobe hypertrophy in the prostate gland (discussed above). Seminal vesicles are unremarkable. Other: No significant volume of ascites.  No pneumoperitoneum. Musculoskeletal: There are no aggressive appearing lytic or blastic lesions noted in the visualized portions of the skeleton. IMPRESSION: 1. Large high  attenuation structure in the urinary bladder, likely a large bladder hematoma given the presence of the indwelling Foley within the lesion and some non dependent locules of gas. However, there is also an  area of enhancement along the posterior wall of the urinary bladder. This is contiguous with the adjacent prostate gland, potentially asymmetric median lobe hypertrophy, however, given the patient's hi

## 2021-08-25 NOTE — Progress Notes (Signed)
Pt noted with issues with large clots in foley catheter tubing, difficulty draining at beginning of shift. Dr. Tresa Moore notified, received order to irrigate as needed in order to facilitate draining.  Manual bladder irrigated performed several times until catheter patent again. Pt in no distress, with minimal discomfort. CBI continues, pt urine now tinged pink. Pt given pain medication x1 for c/o pain at catheter site, effective. Pt comfortable, no complaints of pain at present. Wife at bedside.  ?

## 2021-08-25 NOTE — Plan of Care (Signed)
?  Problem: Education: ?Goal: Knowledge of General Education information will improve ?Description: Including pain rating scale, medication(s)/side effects and non-pharmacologic comfort measures ?08/25/2021 0141 by Sallye Lat, RN ?Outcome: Progressing ?08/25/2021 0055 by Sallye Lat, RN ?Outcome: Progressing ?  ?Problem: Health Behavior/Discharge Planning: ?Goal: Ability to manage health-related needs will improve ?08/25/2021 0141 by Sallye Lat, RN ?Outcome: Progressing ?08/25/2021 0055 by Sallye Lat, RN ?Outcome: Progressing ?  ?

## 2021-08-25 NOTE — TOC Initial Note (Signed)
Transition of Care (TOC) - Initial/Assessment Note  ? ? ?Patient Details  ?Name: Douglas [name] ?MRN: [id number] ?Date of Birth: 23-Apr-1950 ? ?Transition of Care (TOC) CM/SW Contact:    ?Marilu Favre, RN ?Phone Number: ?08/25/2021, 10:54 AM ? ?Clinical Narrative:                 ?Patient from home with wife. Confirmed face sheet information with patient.  ? ?Patient active with [facility] for HHRN,PT and OT. NCM has left message with Amy with [facility] will need orders for services to continue upon discharge.  ? ? ?Patient will need PTAR transportation home at discharge.  ? ?PAtient has DME through [facility] : foley catheter supplies, wheel chair, hospital bed , sliding board, and 3 in 1 .  ? ?Expected Discharge Plan: [place] ?Barriers to Discharge: Continued Medical Work up ? ? ?Patient Goals and CMS Choice ?Patient states their goals for this hospitalization and ongoing recovery are:: to return to home ?CMS Medicare.gov Compare Post Acute Care list provided to:: Patient ?Choice offered to / list presented to : Patient ? ?Expected Discharge Plan and Services ?Expected Discharge Plan: [place] ?  ?Discharge Planning Services: CM Consult ?Post Acute Care Choice: Home Health ?Living arrangements for the past 2 months: [place] ?                ?DME Arranged: N/A ?  ?  ?  ?  ?HH Arranged: RN, PT, OT ?  ?  ?  ?  ? ?Prior Living Arrangements/Services ?Living arrangements for the past 2 months: [place] ?Lives with:: Spouse ?Patient language and need for interpreter reviewed:: Yes ?Do you feel safe going back to the place where you live?: Yes      ?Need for Family Participation in Patient Care: Yes (Comment) ?Care giver support system in place?: Yes (comment) ?Current home services: DME ?Criminal Activity/Legal Involvement Pertinent to Current Situation/Hospitalization: No - Comment as needed ? ?Activities of Daily Living ?  ?  ? ?Permission Sought/Granted ?   ?Permission granted to share information with : Yes, Verbal Permission Granted ?   ? Permission granted to share info w AGENCY: [facility] ?   ?   ? ?Emotional Assessment ?Appearance:: Appears stated age ?Attitude/Demeanor/Rapport: Engaged ?Affect (typically observed): Accepting ?Orientation: : Oriented to Self, Oriented to Place, Oriented to  Time, Oriented to Situation ?Alcohol / Substance Use: Not Applicable ?Psych Involvement: No (comment) ? ?Admission diagnosis:  Gross hematuria [R31.0] ?Patient Active Problem List  ? Diagnosis Date Noted  ? Gross hematuria 08/24/2021  ? Thromboembolism ([place]) 08/24/2021  ? Pressure injury of skin 08/08/2021  ? Acute pulmonary embolism with acute cor pulmonale ([place]) 08/08/2021  ? S/P AKA (above knee amputation) unilateral, right ([place]) 08/08/2021  ? SVT (supraventricular tachycardia) ([place])   ? PAD (peripheral artery disease) ([place])   ? Post-op pain   ? Urine retention   ? Right below-knee amputee ([place]) 07/01/2021  ? Malnutrition of moderate degree 06/28/2021  ? Hypokalemia 06/25/2021  ? Constipation 06/24/2021  ? Physical debility 06/23/2021  ? Coffee ground emesis 06/22/2021  ? Critical limb ischemia of right lower extremity ([place]) 06/18/2021  ? Pacemaker 04/30/2021  ? Elevated troponin   ? Mobitz type 1 second degree AV block   ? CLL (chronic lymphocytic leukemia) ([place]) 01/16/2021  ? NSTEMI (non-ST elevated myocardial infarction) ([place]) 01/16/2021  ? Hypertension 01/16/2021  ? Diabetes mellitus ([place]) 01/16/2021  ? BPH (benign  prostatic hyperplasia) 01/16/2021  ? ?PCP:  [name], MD ?Pharmacy:   ?[facility] [address] [facility] [address] ?[place] ?[place] [address] ?Phone: [phone] Fax: [phone] ? ?[facility] ?[address] ?[place] [address] ?Phone: [phone] Fax: [phone] ? ?[facility] [place] [place] ?[address] ?[place] [address] ?Phone: [phone] Fax: [phone] ? ? ? ? ?Social Determinants of Health (SDOH) Interventions ?  ? ?Readmission Risk Interventions ? ?  08/10/2021  ? 10:44 AM 07/23/2021  ? 11:22 AM 07/01/2021  ?  3:48 PM  ?Readmission Risk Prevention Plan  ?Transportation Screening Complete Complete Complete  ?PCP or Specialist Appt within 3-5 Days  Complete   ?[place] or Home Care Consult  Complete   ?Social Work Consult for [place] Planning/Counseling  Complete   ?Palliative Care Screening  Not Applicable   ?Medication Review Press photographer) Complete Complete Complete  ?PCP or Specialist appointment within 3-5 days of discharge Complete    ?[place] or Home Care Consult Complete  Complete  ?SW Recovery Care/Counseling Consult Complete  Complete  ?Palliative Care Screening Not Applicable  Not Applicable  ?[place] Not Applicable  Not Applicable  ? ? ? ?

## 2021-08-25 NOTE — Progress Notes (Signed)
? ? ? [facility] ?                                                                            ? ? ?Douglas [name], is a 71 y.o. male, DOB - [birth year], VOZ:[id number] ?Admit date - 08/24/2021    ?Outpatient Primary MD for the patient is [name], MD ? ?LOS - 0  days ? ? ? ?Brief summary  ? ? Douglas [name] is a 71 y.o. male with medical history significant of BPH; CLL; and HTN presenting with gross hematuria.  He was discharged from the hospital with a foley, has been there for a while.  His home nurse thought it needed to be changed out to prevent infection.  She tried to change it on 5/15 initially had difficulty replacing it, and meanwhile patient didn't have UOP for hours.  she was able to put in back in around 10 pm on 5/15 and since then , he has gross hematuria and was seen int he ED for further evaluation.  ? ?Assessment & Plan  ? ? ?Assessment and Plan: ? ?Gross hematuria ? probably secondary to traumatic Foley replacement in the setting of anticoagulation ?Patient has history of urinary retention and indwelling Foley placement. ?Urology consulted, new large bore catheter placed on traction for tamponade and on gentle irrigation to prevent new clot formation. ?Right patient had large clots in the Foley catheter tubing underwent several irrigations and currently on CBI with improvement in hematuria. ?Pain control with IV Dilaudid and oral oxycodone. ?Continue finasteride.  Outpatient voiding trial in the outpatient setting. ?Recommend IV Rocephin for another 1 to 2 days prophylactically. ? ? ?History of CLL ?Follows up with Dr. Alen Blew. ? ? ? ?Essential hypertension ?Blood pressure parameters are optimal. ? ? ? ?Diet-controlled type 2 diabetes mellitus. ? ? ?History of recent PE/DVT. ?Patient's aspirin Plavix and Eliquis on hold given gross hematuria. ?We will discuss with urology prior to resuming the anticoagulation. ? ? 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 ?Aspirin and Plavix are currently on hold due to gross hematuria.  Will discuss with urology prior to resuming. ?Continue with Crestor. ? ? ? ?SVT ?Patient having episodic SVT with paroxysms of pain ?As needed metoprolol ? ?Hyponatremia:  ?From mild fluid overload?  ?Monitor.  ?Asymptomatic.  ? ? ? ?RN Pressure Injury Documentation: ?Pressure Injury 08/05/21 Heel Left Stage 1 -  Intact skin with non-blanchable redness of a localized area usually over a bony prominence. redden and blances Foam was on it already. (Active)  ?08/05/21 1930  ?Location: Heel  ?Location Orientation: Left  ?Staging: Stage 1 -  Intact skin with non-blanchable redness of a localized area usually over a bony prominence.  ?Wound Description (Comments): redden and blances Foam was on it already.  ?Present on Admission:   ?Dressing Type Foam - Lift dressing to assess site every shift 08/25/21 0739  ?   ?Pressure Injury 08/05/21 Buttocks Right;Left;Mid Stage 1 -  Intact skin with non-blanchable redness of a localized area usually over a bony prominence. blanches redden (Active)  ?08/05/21 1930  ?Location: Buttocks  ?Location Orientation: Right;Left;Mid  ?Staging: Stage 1 -  Intact skin with non-blanchable redness of  a localized area usually over a bony prominence.  ?Wound Description (Comments): blanches redden  ?Present on Admission:   ?Dressing Type Foam - Lift dressing to assess site every shift 08/25/21 0739  ? ?Local wound care.  ? ?  ? ?Estimated body mass index is 29.84 kg/m? as calculated from the following: ?  Height as of this encounter: 6' (1.829 m). ?  Weight as of this encounter: 99.8 kg. ? ?Code Status: full code.  ?DVT Prophylaxis:  SCDs Start: 08/24/21 1207 ? ? ?Level of Care: Level of care: Telemetry Medical ?Family Communication: none at bedside ? ?Disposition Plan:     Remains inpatient appropriate:  CBI,therapy evaluations.  ? ?Procedures:  ?CBI.  ? ?Consultants:   ?Urology.  ? ?Antimicrobials:  ? ?Anti-infectives  (From admission, onward)  ? ? Start     Dose/Rate Route Frequency Ordered Stop  ? 08/24/21 0745  cefTRIAXone (ROCEPHIN) 1 g in sodium chloride 0.9 % 100 mL IVPB       ? 1 g ?200 mL/hr over 30 Minutes Intravenous  Once 08/24/21 0730 08/24/21 3295  ? ?  ? ? ? ?Medications ? ?Scheduled Meds: ? Chlorhexidine Gluconate Cloth  6 each Topical Daily  ? docusate sodium  100 mg Oral BID  ? finasteride  5 mg Oral Daily  ? melatonin  5 mg Oral QHS  ? rosuvastatin  20 mg Oral Daily  ? senna-docusate  2 tablet Oral BID  ? sodium chloride flush  3 mL Intravenous Q12H  ? ?Continuous Infusions: ? lactated ringers 75 mL/hr at 08/25/21 0556  ? sodium chloride irrigation    ? ?PRN Meds:.acetaminophen **OR** acetaminophen, albuterol, bisacodyl, hydrALAZINE, HYDROmorphone (DILAUDID) injection, ondansetron **OR** ondansetron (ZOFRAN) IV, oxyCODONE-acetaminophen, polyethylene glycol, prochlorperazine ? ? ? ?Subjective:  ? ?Akiem Urieta was seen and examined today.  Some pain in the lower abdomen.  ? ?Objective:  ? ?Vitals:  ? 08/24/21 2028 08/24/21 2330 08/25/21 0437 08/25/21 0724  ?BP: 131/70 134/60 134/75 (!) 145/66  ?Pulse: 81 60 85 98  ?Resp: '17 16 16 16  '$ ?Temp: (!) 97.5 ?F (36.4 ?C) 97.6 ?F (36.4 ?C) 98 ?F (36.7 ?C) 98.2 ?F (36.8 ?C)  ?TempSrc: Oral Oral Oral Oral  ?SpO2: 99% 99% 96% 97%  ?Weight:      ?Height:      ? ? ?Intake/Output Summary (Last 24 hours) at 08/25/2021 1130 ?Last data filed at 08/25/2021 0900 ?Gross per 24 hour  ?Intake 17809.96 ml  ?Output 19975 ml  ?Net -2165.04 ml  ? ?Filed Weights  ? 08/24/21 0527  ?Weight: 99.8 kg  ? ? ? ?Exam ?General exam: Appears calm and comfortable  ?Respiratory system: Clear to auscultation. Respiratory effort normal. ?Cardiovascular system: S1 & S2 heard, RRR. No JVD,  ?Gastrointestinal system: Abdomen is nondistended, soft and nontender. Normal bowel sounds heard. ?Central nervous system: Alert and oriented. No focal neurological deficits. ?Extremities: right AKA ?Skin: No rashes,   ?Psychiatry:  Mood & affect appropriate.  ? ? ?Data Reviewed:  I have personally reviewed following labs and imaging studies ? ? ?CBC ?Lab Results  ?Component Value Date  ? WBC 42.1 (H) 08/25/2021  ? RBC 3.10 (L) 08/25/2021  ? HGB 8.6 (L) 08/25/2021  ? HCT 27.7 (L) 08/25/2021  ? MCV 89.4 08/25/2021  ? MCH 27.7 08/25/2021  ? PLT 349 08/25/2021  ? MCHC 31.0 08/25/2021  ? RDW 18.6 (H) 08/25/2021  ? LYMPHSABS 9.6 (H) 08/24/2021  ? MONOABS 9.2 (H) 08/24/2021  ? EOSABS 0.2 08/24/2021  ?  BASOSABS 0.1 08/24/2021  ? ? ? ?Last metabolic panel ?Lab Results  ?Component Value Date  ? NA 131 (L) 08/25/2021  ? K 4.6 08/25/2021  ? CL 98 08/25/2021  ? CO2 26 08/25/2021  ? BUN 18 08/25/2021  ? CREATININE 0.57 (L) 08/25/2021  ? GLUCOSE 114 (H) 08/25/2021  ? GFRNONAA >60 08/25/2021  ? GFRAA >60 01/08/2020  ? CALCIUM 8.8 (L) 08/25/2021  ? PROT 5.1 (L) 07/14/2021  ? ALBUMIN 2.2 (L) 07/14/2021  ? BILITOT 0.4 07/14/2021  ? ALKPHOS 214 (H) 07/14/2021  ? AST 28 07/14/2021  ? ALT 89 (H) 07/14/2021  ? ANIONGAP 7 08/25/2021  ? ? ?CBG (last 3)  ?No results for input(s): GLUCAP in the last 72 hours.  ? ? ?Coagulation Profile: ?No results for input(s): INR, PROTIME in the last 168 hours. ? ? ?Radiology Studies: ?CT HEMATURIA WORKUP ? ?Result Date: 08/24/2021 ?CLINICAL DATA:  71 year old male with history of gross hematuria. EXAM: CT ABDOMEN AND PELVIS WITHOUT AND WITH CONTRAST TECHNIQUE: Multidetector CT imaging of the abdomen and pelvis was performed following the standard protocol before and following the bolus administration of intravenous contrast. RADIATION DOSE REDUCTION: This exam was performed according to the departmental dose-optimization program which includes automated exposure control, adjustment of the mA and/or kV according to patient size and/or use of iterative reconstruction technique. CONTRAST:  163m OMNIPAQUE IOHEXOL 300 MG/ML  SOLN COMPARISON:  No priors. FINDINGS: Lower chest: Atherosclerotic calcifications in the descending  thoracic aorta. Pacemaker leads in the right atrium and right ventricle. Hepatobiliary: In segment 7 of the liver (axial image 13 of series 9) there is a well-defined 2.1 x 1.9 cm lesion which is low attenuation

## 2021-08-25 NOTE — Plan of Care (Signed)
  Problem: Education: Goal: Knowledge of General Education information will improve Description Including pain rating scale, medication(s)/side effects and non-pharmacologic comfort measures Outcome: Progressing   Problem: Health Behavior/Discharge Planning: Goal: Ability to manage health-related needs will improve Outcome: Progressing   

## 2021-08-25 NOTE — Progress Notes (Signed)
ANTICOAGULATION CONSULT NOTE - Initial Consult ? ?Pharmacy Consult for heparin ?Indication: submassive PE 08/08/21 and BL DVT  ? ?No Known Allergies ? ?Patient Measurements: ?Height: 6' (182.9 cm) ?Weight: 99.8 kg (220 lb) ?IBW/kg (Calculated) : 77.6 ?Heparin Dosing Weight: 98 kg  ? ?Vital Signs: ?Temp: 98.2 ?F (36.8 ?C) (05/17 0724) ?Temp Source: Oral (05/17 0724) ?BP: 145/66 (05/17 0724) ?Pulse Rate: 98 (05/17 0724) ? ?Labs: ?Recent Labs  ?  08/24/21 ?0601 08/24/21 ?1210 08/25/21 ?0512  ?HGB 10.8* 10.7* 8.6*  ?HCT 35.2* 35.1* 27.7*  ?PLT 316 374 349  ?CREATININE 0.51*  --  0.57*  ? ? ?Estimated Creatinine Clearance: 102.1 mL/min (A) (by C-G formula based on SCr of 0.57 mg/dL (L)). ? ? ?Medical History: ?Past Medical History:  ?Diagnosis Date  ? BPH (benign prostatic hyperplasia)   ? CLL (chronic lymphocytic leukemia) ([place])   ? Hypertension   ? Pacemaker   ? Medtronic Device  ? Pulmonary embolism ([place])   ? PVD (peripheral vascular disease) ([place])   ? s/p stent, thrombectomy  ? ?  ?Assessment: ?71 yo M on apixaban for submassive PE 08/08/21 and BL DVTs 08/05/21. Apixaban held for hematuria now improved on continuous bladder irrigation. Apixaban last dose 5/15 ~18:00. Pharmacy consulted for heparin.   ? ?H/H 10.8> 8.6 with known hematuria, plt stable.  ?  ?Goal of Therapy:  ?Monitor platelets by anticoagulation protocol: Yes ?  ?Plan:  ?Heparin 1400 units/hr, no bolus ?F/u 8hr aPTT/HL ?F/u aPTT until correlates with heparin level  ?Monitor daily aPTT, heparin level, CBC ?Monitor for signs/symptoms of bleeding  ? ? ?Benetta Spar, PharmD, BCPS, BCCP ?Clinical Pharmacist ? ?Please check AMION for all [place] phone numbers ?After 10:00 PM, call [place] [phone] ? ?

## 2021-08-25 NOTE — Care Management Obs Status (Signed)
MEDICARE OBSERVATION STATUS NOTIFICATION ? ? ?Patient Details  ?Name: Douglas [name] ?MRN: [id number] ?Date of Birth: [birth year] ? ? ?Medicare Observation Status Notification Given:  Yes ? ? ? ?Carles Collet, RN ?08/25/2021, 8:32 AM ?

## 2021-08-26 ENCOUNTER — Inpatient Hospital Stay (HOSPITAL_COMMUNITY): Payer: 59

## 2021-08-26 ENCOUNTER — Other Ambulatory Visit (HOSPITAL_COMMUNITY): Payer: Self-pay

## 2021-08-26 DIAGNOSIS — R339 Retention of urine, unspecified: Secondary | ICD-10-CM

## 2021-08-26 DIAGNOSIS — I2699 Other pulmonary embolism without acute cor pulmonale: Secondary | ICD-10-CM

## 2021-08-26 DIAGNOSIS — Z86711 Personal history of pulmonary embolism: Secondary | ICD-10-CM

## 2021-08-26 DIAGNOSIS — I2693 Single subsegmental pulmonary embolism without acute cor pulmonale: Secondary | ICD-10-CM

## 2021-08-26 DIAGNOSIS — R310 Gross hematuria: Secondary | ICD-10-CM

## 2021-08-26 LAB — CBC
HCT: 26.1 % — ABNORMAL LOW (ref 39.0–52.0)
Hemoglobin: 7.7 g/dL — ABNORMAL LOW (ref 13.0–17.0)
MCH: 27 pg (ref 26.0–34.0)
MCHC: 29.5 g/dL — ABNORMAL LOW (ref 30.0–36.0)
MCV: 91.6 fL (ref 80.0–100.0)
Platelets: 316 10*3/uL (ref 150–400)
RBC: 2.85 MIL/uL — ABNORMAL LOW (ref 4.22–5.81)
RDW: 18.7 % — ABNORMAL HIGH (ref 11.5–15.5)
WBC: 38.7 10*3/uL — ABNORMAL HIGH (ref 4.0–10.5)
nRBC: 0 % (ref 0.0–0.2)

## 2021-08-26 LAB — URINE CULTURE: Culture: NO GROWTH

## 2021-08-26 LAB — ECHOCARDIOGRAM LIMITED
Height: 72 in
S' Lateral: 4.8 cm
Weight: 3520 oz

## 2021-08-26 LAB — HEPARIN LEVEL (UNFRACTIONATED): Heparin Unfractionated: 0.44 IU/mL (ref 0.30–0.70)

## 2021-08-26 LAB — TROPONIN I (HIGH SENSITIVITY): Troponin I (High Sensitivity): 16 ng/L (ref <18)

## 2021-08-26 LAB — APTT
aPTT: 52 seconds — ABNORMAL HIGH (ref 24–36)
aPTT: 29 seconds (ref 24–36)

## 2021-08-26 LAB — BRAIN NATRIURETIC PEPTIDE: B Natriuretic Peptide: 323.7 pg/mL — ABNORMAL HIGH (ref 0.0–100.0)

## 2021-08-26 LAB — LACTIC ACID, PLASMA: Lactic Acid, Venous: 1.3 mmol/L (ref 0.5–1.9)

## 2021-08-26 MED ORDER — IOHEXOL 350 MG/ML SOLN
100.0000 mL | Freq: Once | INTRAVENOUS | Status: AC | PRN
  Administered 2021-08-26: 100 mL via INTRAVENOUS

## 2021-08-26 MED ORDER — AMIODARONE HCL 200 MG PO TABS
200.0000 mg | ORAL_TABLET | Freq: Every day | ORAL | Status: DC
  Administered 2021-08-26 – 2021-09-01 (×7): 200 mg via ORAL
  Filled 2021-08-26 (×7): qty 1

## 2021-08-26 MED ORDER — IPRATROPIUM-ALBUTEROL 0.5-2.5 (3) MG/3ML IN SOLN
3.0000 mL | Freq: Four times a day (QID) | RESPIRATORY_TRACT | Status: DC
  Administered 2021-08-26 – 2021-08-27 (×5): 3 mL via RESPIRATORY_TRACT
  Filled 2021-08-26 (×5): qty 3

## 2021-08-26 NOTE — TOC Benefit Eligibility Note (Signed)
Patient Teacher, English as a foreign language completed.    The patient is currently admitted and upon discharge could be taking Eliquis 5 mg.  The current 30 day co-pay is, $0.00.   The patient is currently admitted and upon discharge could be taking Xarelto 20 mg.  The current 30 day co-pay is, $0.00.   The patient is insured through [place], [place] Patient Advocate Specialist [place] Patient Advocate Team Direct Number: [phone]  Fax: [phone]

## 2021-08-26 NOTE — Progress Notes (Signed)
[facility]                                                                               Douglas [name], is a 71 y.o. male, DOB - 1951/02/06, OLM:[id number] Admit date - 08/24/2021    Outpatient Primary MD for the patient is [name], MD  LOS - 1  days    Brief summary    Douglas [name] is a 71 y.o. male with medical history significant of BPH; CLL; and HTN presenting with gross hematuria.  He was discharged from the hospital with a foley, has been there for a while.  His home nurse thought it needed to be changed out to prevent infection.  She tried to change it on 5/15 initially had difficulty replacing it, and meanwhile patient didn't have UOP for hours.  she was able to put in back in around 10 pm on 5/15 and since then , he has gross hematuria and was seen int he ED for further evaluation.   Assessment & Plan    Assessment and Plan:  Gross hematuria probably secondary to traumatic Foley replacement in the setting of anticoagulation Patient has history of urinary retention and indwelling Foley placement. Urology consulted, new large bore catheter placed on traction for tamponade and on gentle irrigation to prevent new clot formation. patient had large clots in the Foley catheter tubing underwent several irrigations and currently on CBI with improvement in hematuria. He was started on IV heparin but had to be discontinued as he had recurrent hematuria.  Pain control with IV Dilaudid and oral oxycodone. Continue finasteride.  Outpatient voiding trial in the outpatient setting. Recommend IV Rocephin for another 1 to 2 days prophylactically.   History of CLL Follows up with Dr. Alen Blew.    Essential hypertension Blood pressure parameters are well controlled.     Diet-controlled type 2 diabetes mellitus.    History of recent PE/DVT. Patient's aspirin Plavix and Eliquis on hold given gross hematuria. Pt had a submassive PE less than a month ago,  with right heart strain. Pt reports that he had some sob, required albuterol neb . CXR is unremarkable.  PCCM consulted as we are unable to anticoagulate due to hematuria , to see if catheter thrombectomy is warranted . A repeat CTA and venous duplex ordered.  Echocardiogram repeated to check for right heart strain.     Limb ischemia S/p right AKA Recently with angiogram and thrombectomy. Aspirin and Plavix are currently on hold due to gross hematuria.   Continue with Crestor.    Recent wide complex tachycardia secondary to SVT. Continue with amiodarone 200 mg daily.  Patient having episodic SVT with paroxysms of pain As needed metoprolol  Hyponatremia:  From mild fluid overload?  Monitor.  Asymptomatic.  Recheck BMP in am.   Acute Anemia of blood loss from hematuria: Transfuse to keep hemoglobin greater than 7,      RN Pressure Injury Documentation: Pressure Injury 08/05/21 Heel Left Stage 1 -  Intact skin with non-blanchable redness of a localized area usually over a bony prominence. redden and blances Foam was on it already. (Active)  08/05/21 1930  Location: Heel  Location Orientation:  Left  Staging: Stage 1 -  Intact skin with non-blanchable redness of a localized area usually over a bony prominence.  Wound Description (Comments): redden and blances Foam was on it already.  Present on Admission:   Dressing Type Foam - Lift dressing to assess site every shift 08/26/21 0730     Pressure Injury 08/05/21 Buttocks Right;Left;Mid Stage 1 -  Intact skin with non-blanchable redness of a localized area usually over a bony prominence. blanches redden (Active)  08/05/21 1930  Location: Buttocks  Location Orientation: Right;Left;Mid  Staging: Stage 1 -  Intact skin with non-blanchable redness of a localized area usually over a bony prominence.  Wound Description (Comments): blanches redden  Present on Admission:   Dressing Type Foam - Lift dressing to assess site every shift  08/26/21 0730   Local wound care.      Estimated body mass index is 29.84 kg/m as calculated from the following:   Height as of this encounter: 6' (1.829 m).   Weight as of this encounter: 99.8 kg.  Code Status: full code.  DVT Prophylaxis:  SCDs Start: 08/24/21 1207   Level of Care: Level of care: Med-Surg Family Communication: none at bedside  Disposition Plan:     Remains inpatient appropriate:  CBI,therapy evaluations.   Procedures:  CBI.   Consultants:   Urology.   Antimicrobials:   Anti-infectives (From admission, onward)    Start     Dose/Rate Route Frequency Ordered Stop   08/25/21 1134  cefTRIAXone (ROCEPHIN) 1 g in sodium chloride 0.9 % 100 mL IVPB        1 g 200 mL/hr over 30 Minutes Intravenous Every 24 hours 08/25/21 1131 08/26/21 1223   08/24/21 0745  cefTRIAXone (ROCEPHIN) 1 g in sodium chloride 0.9 % 100 mL IVPB        1 g 200 mL/hr over 30 Minutes Intravenous  Once 08/24/21 0730 08/24/21 4503        Medications  Scheduled Meds:  amiodarone  200 mg Oral Daily   Chlorhexidine Gluconate Cloth  6 each Topical Daily   docusate sodium  100 mg Oral BID   finasteride  5 mg Oral Daily   ipratropium-albuterol  3 mL Nebulization Q6H   melatonin  5 mg Oral QHS   rosuvastatin  20 mg Oral Daily   senna-docusate  2 tablet Oral BID   sodium chloride flush  3 mL Intravenous Q12H   Continuous Infusions:  sodium chloride 75 mL/hr at 08/26/21 0433   sodium chloride irrigation     PRN Meds:.acetaminophen **OR** acetaminophen, albuterol, bisacodyl, hydrALAZINE, HYDROmorphone (DILAUDID) injection, metoprolol tartrate, ondansetron **OR** ondansetron (ZOFRAN) IV, oxyCODONE-acetaminophen, polyethylene glycol, prochlorperazine    Subjective:   Douglas [name] was seen and examined today.  Sob earlier this am. No chest pain.  Hematuria recurrent.   Objective:   Vitals:   08/26/21 0439 08/26/21 0526 08/26/21 0748 08/26/21 1019  BP:  126/67 128/60 135/69   Pulse:  85 82 83  Resp:  '19 16 18  '$ Temp:  98 F (36.7 C) (!) 97.5 F (36.4 C) 98.4 F (36.9 C)  TempSrc:  Oral Oral Axillary  SpO2: 100% 96% 96% 100%  Weight:      Height:        Intake/Output Summary (Last 24 hours) at 08/26/2021 1445 Last data filed at 08/26/2021 0800 Gross per 24 hour  Intake 10708.32 ml  Output 8875 ml  Net 1833.32 ml    Filed Weights   08/24/21 0527  Weight: 99.8 kg     Exam General exam: Appears calm and comfortable  Respiratory system: Clear to auscultation. Respiratory effort normal. Cardiovascular system: S1 & S2 heard, RRR. No JVD,  No pedal edema. Gastrointestinal system: Abdomen is nondistended, soft and nontender. Normal bowel sounds heard. Central nervous system: Alert and oriented. No focal neurological deficits. Extremities: right AKA.  Skin: No rashes, lesions or ulcers Psychiatry: Mood & affect appropriate.     Data Reviewed:  I have personally reviewed following labs and imaging studies   CBC Lab Results  Component Value Date   WBC 38.7 (H) 08/26/2021   RBC 2.85 (L) 08/26/2021   HGB 7.7 (L) 08/26/2021   HCT 26.1 (L) 08/26/2021   MCV 91.6 08/26/2021   MCH 27.0 08/26/2021   PLT 316 08/26/2021   MCHC 29.5 (L) 08/26/2021   RDW 18.7 (H) 08/26/2021   LYMPHSABS 9.6 (H) 08/24/2021   MONOABS 9.2 (H) 08/24/2021   EOSABS 0.2 08/24/2021   BASOSABS 0.1 49/44/9675     Last metabolic panel Lab Results  Component Value Date   NA 131 (L) 08/25/2021   K 4.6 08/25/2021   CL 98 08/25/2021   CO2 26 08/25/2021   BUN 18 08/25/2021   CREATININE 0.57 (L) 08/25/2021   GLUCOSE 114 (H) 08/25/2021   GFRNONAA >60 08/25/2021   GFRAA >60 01/08/2020   CALCIUM 8.8 (L) 08/25/2021   PROT 5.1 (L) 07/14/2021   ALBUMIN 2.2 (L) 07/14/2021   BILITOT 0.4 07/14/2021   ALKPHOS 214 (H) 07/14/2021   AST 28 07/14/2021   ALT 89 (H) 07/14/2021   ANIONGAP 7 08/25/2021    CBG (last 3)  No results for input(s): GLUCAP in the last 72 hours.     Coagulation Profile: No results for input(s): INR, PROTIME in the last 168 hours.   Radiology Studies: DG CHEST PORT 1 VIEW  Result Date: 08/26/2021 CLINICAL DATA:  Shortness of breath EXAM: PORTABLE CHEST 1 VIEW COMPARISON:  08/07/2021 FINDINGS: The heart size and mediastinal contours are within normal limits. Both lungs are clear. The visualized skeletal structures are unremarkable. Unchanged position of left chest wall pacemaker leads. IMPRESSION: No active disease. Electronically Signed   By: Ulyses Jarred M.D.   On: 08/26/2021 00:41   ECHOCARDIOGRAM LIMITED  Result Date: 08/26/2021    ECHOCARDIOGRAM LIMITED REPORT   Patient Name:   Douglas [name] Date of Exam: 08/26/2021 Medical Rec #:  [id number]        Height:       72.0 in Accession #:    [id number]       Weight:       220.0 lb Date of Birth:  1951-03-24         BSA:          2.219 m Patient Age:    25 years         BP:           135/69 mmHg Patient Gender: M                HR:           85 bpm. Exam Location:  Inpatient Procedure: 2D Echo and Limited Echo Indications:    Pulmonary embolism  History:        Patient has prior history of Echocardiogram examinations, most                 recent 08/07/2021. Risk Factors:Hypertension and Diabetes.  Sonographer:    Jefferey Pica Referring  Phys: [id number] GRACE E BOWSER IMPRESSIONS  1. Since TTE 08/07/21 RV function improved LV function worse with RWMA in septum and apex.  2. Global hypokinesis worse in the septum and apex . Left ventricular ejection fraction, by estimation, is 35 to 40%. The left ventricle has moderately decreased function. The left ventricle has no regional wall motion abnormalities. The left ventricular internal cavity size was moderately dilated. There is mild left ventricular hypertrophy.  3. Right ventricular systolic function is normal. The right ventricular size is normal.  4. Left atrial size was moderately dilated.  5. The mitral valve is abnormal. No evidence of mitral  valve regurgitation. No evidence of mitral stenosis.  6. The aortic valve is tricuspid. There is mild calcification of the aortic valve. Aortic valve regurgitation is not visualized. Aortic valve sclerosis is present, with no evidence of aortic valve stenosis.  7. Aortic dilatation noted. There is mild dilatation of the ascending aorta, measuring 39 mm.  8. The inferior vena cava is normal in size with greater than 50% respiratory variability, suggesting right atrial pressure of 3 mmHg. FINDINGS  Left Ventricle: Global hypokinesis worse in the septum and apex. Left ventricular ejection fraction, by estimation, is 35 to 40%. The left ventricle has moderately decreased function. The left ventricle has no regional wall motion abnormalities. The left ventricular internal cavity size was moderately dilated. There is mild left ventricular hypertrophy. Right Ventricle: The right ventricular size is normal. No increase in right ventricular wall thickness. Right ventricular systolic function is normal. Left Atrium: Left atrial size was moderately dilated. Right Atrium: Right atrial size was normal in size. Pericardium: There is no evidence of pericardial effusion. Mitral Valve: The mitral valve is abnormal. There is mild thickening of the mitral valve leaflet(s). There is mild calcification of the mitral valve leaflet(s). Mild mitral annular calcification. No evidence of mitral valve stenosis. Tricuspid Valve: The tricuspid valve is normal in structure. Tricuspid valve regurgitation is not demonstrated. No evidence of tricuspid stenosis. Aortic Valve: The aortic valve is tricuspid. There is mild calcification of the aortic valve. Aortic valve regurgitation is not visualized. Aortic valve sclerosis is present, with no evidence of aortic valve stenosis. Pulmonic Valve: The pulmonic valve was normal in structure. Pulmonic valve regurgitation is not visualized. No evidence of pulmonic stenosis. Aorta: Aortic dilatation noted.  There is mild dilatation of the ascending aorta, measuring 39 mm. Venous: The inferior vena cava is normal in size with greater than 50% respiratory variability, suggesting right atrial pressure of 3 mmHg. IAS/Shunts: No atrial level shunt detected by color flow Doppler. Additional Comments: Since TTE 08/07/21 RV function improved LV function worse with RWMA in septum and apex. LEFT VENTRICLE PLAX 2D LVIDd:         5.90 cm LVIDs:         4.80 cm LV PW:         1.30 cm LV IVS:        1.20 cm LVOT diam:     2.10 cm LVOT Area:     3.46 cm  IVC IVC diam: 2.00 cm LEFT ATRIUM             Index        RIGHT ATRIUM           Index LA diam:        4.40 cm 1.98 cm/m   RA Area:     18.20 cm LA Vol (A2C):   80.4 ml 36.24 ml/m  RA  Volume:   49.40 ml  22.27 ml/m LA Vol (A4C):   74.4 ml 33.53 ml/m LA Biplane Vol: 78.6 ml 35.43 ml/m   AORTA Ao Root diam: 4.10 cm Ao Asc diam:  3.90 cm  SHUNTS Systemic Diam: 2.10 cm Jenkins Rouge MD Electronically signed by Jenkins Rouge MD Signature Date/Time: 08/26/2021/12:02:00 PM    Final        Hosie Poisson M.D. [facility] 08/26/2021, 2:45 PM  Available via Epic secure chat 7am-7pm After 7 pm, please refer to night coverage provider listed on amion.

## 2021-08-26 NOTE — TOC CM/SW Note (Signed)
Eliquis copay is $0.00, Xarelto copay is $0.00

## 2021-08-26 NOTE — Progress Notes (Signed)
BLE venous duplex has been completed.   Results can be found under chart review under CV PROC. 08/26/2021 3:41 PM Tullio Chausse RVT, RDMS

## 2021-08-26 NOTE — Consult Note (Signed)
NAMEDiezel [name], MRN:  [id number], DOB:  [birth year], LOS: 1 ADMISSION DATE:  08/24/2021, CONSULTATION DATE: 5/18 REFERRING MD: Dr. Karleen Hampshire, CHIEF COMPLAINT: Hematuria; previous PE  History of Present Illness:  Patient is 71 year old male with pertinent PMH of BPH, CLL, HTN, recent PE 4/30 and LLE DVT s/p thrombectomy on Eliquis admitted to [facility] on 5/16 with hematuria.  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  Ultrasound showed DVT on left lower extremity.  Heparin started. LLE thrombectomy performed 4/28. Echo showing severe RV dysfunction. 4/30 CTA showing b/l segmental/subsegmental PE. PCCM recommended continue heparin. Patient discharged 5/2. Sent home on Eliquis. Also discharged with foley.  Patient reports on 5/15 Foley catheter was changed by home health nurse.  They had difficulty inserting it and he started having bleeding from Foley later that night.  Patient transported to [facility] ED on 5/16 for evaluation of hematuria.  Vital stable.  Hemoglobin 8.  Anti-coagulation was held.  Urology consulted and placed large bore catheter.  Patient admitted for closer observation.  On 5/18, PCCM consulted for assistance with medical management of PE.  Pertinent  Medical History   Past Medical History:  Diagnosis Date   BPH (benign prostatic hyperplasia)    CLL (chronic lymphocytic leukemia) ([place])    Hypertension    Pacemaker    Medtronic Device   Pulmonary embolism (HCC)    PVD (peripheral vascular disease) ([place])    s/p stent, thrombectomy     Significant Hospital Events: Including procedures, antibiotic start and stop dates in addition to other pertinent events   5/16: admitted to [facility] w/ hematuria 5/18: pccm consulted  Interim History / Subjective:  Patient bp stable Sats stable on room air Denies chest pain States he has Mild shortness of breath at rest but currently in no distress.  Objective   Blood  pressure 128/60, pulse 82, temperature (!) 97.5 F (36.4 C), temperature source Oral, resp. rate 16, height 6' (1.829 m), weight 99.8 kg, SpO2 96 %.        Intake/Output Summary (Last 24 hours) at 08/26/2021 1018 Last data filed at 08/26/2021 0800 Gross per 24 hour  Intake 10708.32 ml  Output 9375 ml  Net 1333.32 ml   Filed Weights   08/24/21 0527  Weight: 99.8 kg    Examination: General:   NAD HEENT: MM pink/moist Neuro: Aox3; MAE CV: s1s2, no m/r/g PULM:  dim clear BS bilaterally; on room air GI: soft, bsx4 active  Extremities: warm/dry, R bka; 1+ LLLE   Resolved Hospital Problem list     Assessment & Plan:   Recent B/L lobar/segmental PE 4/30 s/p thrombectomy and on Eliquis P: -Continue to hold heparin in setting of hematuria -We will check CTA to evaluate PE -Check BNP, troponin, LA -Check echo -Check LLE DVT ultrasound  Hematuria: Foley dependent; likely due to Foley trauma and on Eliquis BPH Urine retention P: -Continue to hold AC with hematuria; consider resuming heparin per urology -Urology consulted; appreciate recs -Large urinary catheter placed with improvement in bleeding -trend H/H -BPH meds per primary -abx for UTI ppx  CLL HTN DM T2 PAD SVT P: -Per primary   Best Practice (right click and "Reselect all SmartList Selections" daily)   Per primary  Labs   CBC: Recent Labs  Lab 08/24/21 0601 08/24/21 1210 08/25/21 0512 08/26/21 0115  WBC 29.9* 39.7* 42.1* 38.7*  NEUTROABS 10.6*  --   --   --  HGB 10.8* 10.7* 8.6* 7.7*  HCT 35.2* 35.1* 27.7* 26.1*  MCV 89.3 88.9 89.4 91.6  PLT 316 374 349 992    Basic Metabolic Panel: Recent Labs  Lab 08/24/21 0601 08/25/21 0512  NA 132* 131*  K 4.0 4.6  CL 99 98  CO2 24 26  GLUCOSE 169* 114*  BUN 10 18  CREATININE 0.51* 0.57*  CALCIUM 8.7* 8.8*   GFR: Estimated Creatinine Clearance: 102.1 mL/min (A) (by C-G formula based on SCr of 0.57 mg/dL (L)). Recent Labs  Lab  08/24/21 0601 08/24/21 1210 08/25/21 0512 08/26/21 0115  WBC 29.9* 39.7* 42.1* 38.7*    Liver Function Tests: No results for input(s): AST, ALT, ALKPHOS, BILITOT, PROT, ALBUMIN in the last 168 hours. No results for input(s): LIPASE, AMYLASE in the last 168 hours. No results for input(s): AMMONIA in the last 168 hours.  ABG    Component Value Date/Time   PHART 7.473 (H) 06/22/2021 1230   PCO2ART 42.6 06/22/2021 1230   PO2ART 89 06/22/2021 1230   HCO3 31.2 (H) 06/22/2021 1230   TCO2 21 (L) 08/03/2021 1919   O2SAT 97 06/22/2021 1230     Coagulation Profile: No results for input(s): INR, PROTIME in the last 168 hours.  Cardiac Enzymes: No results for input(s): CKTOTAL, CKMB, CKMBINDEX, TROPONINI in the last 168 hours.  HbA1C: Hgb A1c MFr Bld  Date/Time Value Ref Range Status  08/05/2021 07:03 AM 6.1 (H) 4.8 - 5.6 % Final    Comment:    (NOTE) Pre diabetes:          5.7%-6.4%  Diabetes:              >6.4%  Glycemic control for   <7.0% adults with diabetes   01/16/2021 06:10 PM 6.7 (H) 4.8 - 5.6 % Final    Comment:    (NOTE) Pre diabetes:          5.7%-6.4%  Diabetes:              >6.4%  Glycemic control for   <7.0% adults with diabetes     CBG: No results for input(s): GLUCAP in the last 168 hours.  Review of Systems:   Review of Systems  Constitutional:  Negative for fever.  Respiratory:  Positive for shortness of breath. Negative for cough, sputum production and wheezing.   Cardiovascular:  Negative for chest pain and orthopnea.  Gastrointestinal:  Negative for constipation, diarrhea, nausea and vomiting.    Past Medical History:  He,  has a past medical history of BPH (benign prostatic hyperplasia), CLL (chronic lymphocytic leukemia) ([place]), Hypertension, Pacemaker, Pulmonary embolism ([place]), and PVD (peripheral vascular disease) ([place]).   Surgical History:   Past Surgical History:  Procedure Laterality Date   ABDOMINAL AORTOGRAM W/LOWER EXTREMITY  Bilateral 06/21/2021   Procedure: ABDOMINAL AORTOGRAM W/LOWER EXTREMITY;  Surgeon: Waynetta Sandy, MD;  Location: [place] [facility];  Service: Cardiovascular;  Laterality: Bilateral;   ABDOMINAL AORTOGRAM W/LOWER EXTREMITY N/A 08/04/2021   Procedure: ABDOMINAL AORTOGRAM W/LOWER EXTREMITY;  Surgeon: Serafina Mitchell, MD;  Location: [place] [facility];  Service: Cardiovascular;  Laterality: N/A;   AMPUTATION Right 06/25/2021   Procedure: RIGHT BELOW KNEE AMPUTATION;  Surgeon: Waynetta Sandy, MD;  Location: [place];  Service: Vascular;  Laterality: Right;   AMPUTATION Right 07/19/2021   Procedure: AMPUTATION ABOVE KNEE;  Surgeon: Angelia Mould, MD;  Location[facility];  Service: Vascular;  Laterality: Right;   FEMORAL-POPLITEAL BYPASS GRAFT Right 06/22/2021  Procedure: RIGHT FEMORAL-POPLITEAL BYPASS WITH VEIN, RIGHT POPLITEAL THROMBECTOMY;  Surgeon: Waynetta Sandy, MD;  Location: [place];  Service: Vascular;  Laterality: Right;   LEFT HEART CATH AND CORONARY ANGIOGRAPHY N/A 01/18/2021   Procedure: LEFT HEART CATH AND CORONARY ANGIOGRAPHY;  Surgeon: Burnell Blanks, MD;  Location: [place] [facility];  Service: Cardiovascular;  Laterality: N/A;   LOWER EXTREMITY VENOGRAPHY Left 08/06/2021   Procedure: LOWER EXTREMITY VENOGRAPHY;  Surgeon: Cherre Robins, MD;  Location: [place] [facility];  Service: Cardiovascular;  Laterality: Left;   PACEMAKER IMPLANT N/A 01/19/2021   Procedure: PACEMAKER IMPLANT;  Surgeon: Evans Lance, MD;  Location: [place] [facility];  Service: Cardiovascular;  Laterality: N/A;   PERIPHERAL VASCULAR INTERVENTION  08/04/2021   Procedure: PERIPHERAL VASCULAR INTERVENTION;  Surgeon: Serafina Mitchell, MD;  Location: [place] [facility];  Service: Cardiovascular;;   PERIPHERAL VASCULAR THROMBECTOMY  08/06/2021   Procedure: PERIPHERAL VASCULAR THROMBECTOMY;  Surgeon: Cherre Robins, MD;  Location: [place] [facility];  Service: Cardiovascular;;   Left Popliteal Vein     Social History:   reports that he has quit smoking. His smoking use included cigarettes. He has a 5.00 pack-year smoking history. He has never used smokeless tobacco. He reports that he does not currently use alcohol. He reports that he does not use drugs.   Family History:  His family history includes Coronary artery disease in his father; Lung cancer in his mother.   Allergies No Known Allergies   Home Medications  Prior to Admission medications   Medication Sig Start Date End Date Taking? Authorizing Provider  acetaminophen (TYLENOL) 500 MG tablet Take 500 mg by mouth at bedtime.   Yes [provider]  albuterol (PROVENTIL) (2.5 MG/3ML) 0.083% nebulizer solution Take 2.5 mg by nebulization every 6 (six) hours as needed for wheezing or shortness of breath.   Yes [provider]  APIXABAN [place]) VTE STARTER PACK ('10MG'$  AND '5MG'$ ) Take as directed on package: start with two-'5mg'$  tablets twice daily for 6 days (thru 08-16-21). On day 08-17-21 switch to one-'5mg'$  tablet twice daily. 08/10/21  Yes Waynetta Sandy, MD  ascorbic acid (VITAMIN C) 1000 MG tablet Take 1 tablet (1,000 mg total) by mouth daily. 07/15/21  Yes Angiulli, Lavon Paganini, PA-C  aspirin EC 81 MG EC tablet Take 1 tablet (81 mg total) by mouth daily. Swallow whole. 07/02/21  Yes Little Ishikawa, MD  clopidogrel (PLAVIX) 75 MG tablet Take 1 tablet (75 mg total) by mouth daily with breakfast. 08/10/21  Yes Ulyses Amor, PA-C  docusate sodium (COLACE) 100 MG capsule Take 1 capsule (100 mg total) by mouth 2 (two) times daily. Patient taking differently: Take 100 mg by mouth daily as needed for mild constipation. 07/15/21  Yes Angiulli, Lavon Paganini, PA-C  finasteride (PROSCAR) 5 MG tablet Take 1 tablet (5 mg total) by mouth daily. 07/15/21  Yes Angiulli, Lavon Paganini, PA-C  melatonin 5 MG TABS Take 1 tablet (5 mg total) by mouth at bedtime. 07/15/21  Yes Angiulli, Lavon Paganini, PA-C  methocarbamol (ROBAXIN)  500 MG tablet Take 1 tablet (500 mg total) by mouth 3 (three) times daily. Patient taking differently: Take 500 mg by mouth at bedtime as needed for muscle spasms. 07/15/21  Yes Angiulli, Lavon Paganini, PA-C  oxymetazoline (AFRIN) 0.05 % nasal spray Place 1 spray into both nostrils 2 (two) times daily as needed for congestion.   Yes [provider]  Oxymetazoline HCl ([place]  FULL FORCE NA) Place 1 spray into the nose 2 (two) times daily as needed (congestion).   Yes [provider]  rosuvastatin (CRESTOR) 20 MG tablet Take 1 tablet (20 mg total) by mouth daily. 08/10/21  Yes Ulyses Amor, PA-C  apixaban (ELIQUIS) 5 MG TABS tablet Take 1 tablet (5 mg total) by mouth 2 (two) times daily. Starting after the completion of the Eliquis VTE Starter Pack. Patient not taking: Reported on 08/24/2021 09/16/21   Waynetta Sandy, MD         JD Rexene Agent [facility] 08/26/2021, 10:18 AM  Please see [email or web address] for pager details.  From 7A-7P if no response, please call [phone]. After hours, please call ELink [phone].

## 2021-08-26 NOTE — Progress Notes (Signed)
ANTICOAGULATION CONSULT NOTE - Initial Consult  Pharmacy Consult for heparin Indication: submassive PE 08/08/21 and BL DVT   No Known Allergies  Patient Measurements: Height: 6' (182.9 cm) Weight: 99.8 kg (220 lb) IBW/kg (Calculated) : 77.6 Heparin Dosing Weight: 98 kg   Vital Signs: Temp: 98.4 F (36.9 C) (05/18 1019) Temp Source: Axillary (05/18 1019) BP: 135/69 (05/18 1019) Pulse Rate: 83 (05/18 1019)  Labs: Recent Labs    08/24/21 0601 08/24/21 1210 08/25/21 0512 08/26/21 0115 08/26/21 0953  HGB 10.8* 10.7* 8.6* 7.7*  --   HCT 35.2* 35.1* 27.7* 26.1*  --   PLT 316 374 349 316  --   APTT  --   --   --  52* 29  HEPARINUNFRC  --   --   --  0.44  --   CREATININE 0.51*  --  0.57*  --   --     Estimated Creatinine Clearance: 102.1 mL/min (A) (by C-G formula based on SCr of 0.57 mg/dL (L)).   Medical History: Past Medical History:  Diagnosis Date   BPH (benign prostatic hyperplasia)    CLL (chronic lymphocytic leukemia) (HCC)    Hypertension    Pacemaker    Medtronic Device   Pulmonary embolism (HCC)    PVD (peripheral vascular disease) (HCC)    s/p stent, thrombectomy     Assessment: 71 yo M on apixaban for submassive PE 08/08/21 and BL DVTs 08/05/21. Apixaban held for hematuria now improved on continuous bladder irrigation. Apixaban last dose 5/15 ~18:00. Pharmacy consulted for heparin.    Heparin was discontinued at 7:30 due to worsening hematuria. Scheduled aPTT was drawn after and is subtherapeutic as expected. Discussed with MD, hold heparin for now, will reconsult when safe to continue.   Goal of Therapy:  Heparin level 0.3- 0.7 units/mL aPTT 66-102 seconds Monitor platelets by anticoagulation protocol: Yes   Plan:  Stop heparin for now    Benetta Spar, PharmD, BCPS, BCCP Clinical Pharmacist  Please check AMION for all [place] phone numbers After 10:00 PM, call [place] [phone]

## 2021-08-26 NOTE — Progress Notes (Signed)
PT Cancellation Note  Patient Details Name: Douglas [name] MRN: [id number] DOB: 12-16-1950   Cancelled Treatment:    Reason Eval/Treat Not Completed: PT eval received, chart reviewed. Pt appeared very anxious regarding PE and ongoing bleeding. Pt reports that when he moves around in bed he notes bleeding through the catheter tubing and is concerned about increasing any bleeding with mobility. Pt was educated on reason for PT eval and benefits of mobility. Anxiety appeared to be increasing with conversation, and therapist offered for PT to return tomorrow to check on him. Pt was agreeable to this.    Thelma Comp 08/26/2021, 1:58 PM  Rolinda Roan, PT, DPT Acute Rehabilitation Services Secure Chat Preferred Office: [phone]

## 2021-08-26 NOTE — Progress Notes (Signed)
ANTICOAGULATION CONSULT NOTE Pharmacy Consult for heparin Indication: PE/DVT Brief A/P: aPTT subtherapeutic  Increase Heparin rate  No Known Allergies  Patient Measurements: Height: 6' (182.9 cm) Weight: 99.8 kg (220 lb) IBW/kg (Calculated) : 77.6 Heparin Dosing Weight: 98 kg   Vital Signs: Temp: 98.3 F (36.8 C) (05/17 2312) Temp Source: Oral (05/17 2312) BP: 128/72 (05/17 2312) Pulse Rate: 83 (05/17 2312)  Labs: Recent Labs    08/24/21 0601 08/24/21 1210 08/25/21 0512 08/26/21 0115  HGB 10.8* 10.7* 8.6* 7.7*  HCT 35.2* 35.1* 27.7* 26.1*  PLT 316 374 349 316  APTT  --   --   --  52*  HEPARINUNFRC  --   --   --  0.44  CREATININE 0.51*  --  0.57*  --      Estimated Creatinine Clearance: 102.1 mL/min (A) (by C-G formula based on SCr of 0.57 mg/dL (L)).  Assessment: 71 yo male with recent VTE, Eliquis on hold, for heparin     Goal of Therapy:  aPTT 66-102 sec Heparin level 0.3-0.7 units/mL Monitor platelets by anticoagulation protocol: Yes   Plan:  Increase Heparin 1600 units/hr Check aPTT in 8 hours   Phillis Knack, PharmD, BCPS

## 2021-08-27 LAB — PREPARE RBC (CROSSMATCH)

## 2021-08-27 LAB — CBC
HCT: 23.4 % — ABNORMAL LOW (ref 39.0–52.0)
Hemoglobin: 7.4 g/dL — ABNORMAL LOW (ref 13.0–17.0)
MCH: 28.4 pg (ref 26.0–34.0)
MCHC: 31.6 g/dL (ref 30.0–36.0)
MCV: 89.7 fL (ref 80.0–100.0)
Platelets: 258 10*3/uL (ref 150–400)
RBC: 2.61 MIL/uL — ABNORMAL LOW (ref 4.22–5.81)
RDW: 18.7 % — ABNORMAL HIGH (ref 11.5–15.5)
WBC: 23.1 10*3/uL — ABNORMAL HIGH (ref 4.0–10.5)
nRBC: 0 % (ref 0.0–0.2)

## 2021-08-27 MED ORDER — HEPARIN (PORCINE) 25000 UT/250ML-% IV SOLN
1700.0000 [IU]/h | INTRAVENOUS | Status: DC
  Administered 2021-08-27: 1350 [IU]/h via INTRAVENOUS
  Administered 2021-08-28: 1500 [IU]/h via INTRAVENOUS
  Administered 2021-08-29 – 2021-08-30 (×2): 1700 [IU]/h via INTRAVENOUS
  Filled 2021-08-27 (×5): qty 250

## 2021-08-27 MED ORDER — SODIUM CHLORIDE 0.9% IV SOLUTION: Freq: Once | INTRAVENOUS | Status: AC

## 2021-08-27 MED ORDER — PHENAZOPYRIDINE HCL 100 MG PO TABS
100.0000 mg | ORAL_TABLET | Freq: Three times a day (TID) | ORAL | Status: DC
  Administered 2021-08-27 – 2021-08-30 (×9): 100 mg via ORAL
  Filled 2021-08-27 (×10): qty 1

## 2021-08-27 MED ORDER — TRAMADOL HCL 50 MG PO TABS
50.0000 mg | ORAL_TABLET | Freq: Four times a day (QID) | ORAL | Status: DC | PRN
  Administered 2021-08-28 – 2021-09-01 (×6): 50 mg via ORAL
  Filled 2021-08-27 (×6): qty 1

## 2021-08-27 MED ORDER — IPRATROPIUM-ALBUTEROL 0.5-2.5 (3) MG/3ML IN SOLN
3.0000 mL | Freq: Two times a day (BID) | RESPIRATORY_TRACT | Status: DC
  Administered 2021-08-27 – 2021-08-30 (×5): 3 mL via RESPIRATORY_TRACT
  Filled 2021-08-27 (×6): qty 3

## 2021-08-27 NOTE — Progress Notes (Signed)
[facility]                                                                               Caleel Kiner, is a 71 y.o. male, DOB - Dec 21, 1950, FFM:[id number] Admit date - 08/24/2021    Outpatient Primary MD for the patient is [name], MD  LOS - 2  days    Brief summary    Alfonsa Vaile is a 71 y.o. male with medical history significant of BPH; CLL; and HTN presenting with gross hematuria.  He was discharged from the hospital with a foley, has been there for a while.  His home nurse thought it needed to be changed out to prevent infection.  She tried to change it on 5/15 initially had difficulty replacing it, and meanwhile patient didn't have UOP for hours.  she was able to put in back in around 10 pm on 5/15 and since then , he has gross hematuria and was seen int he ED for further evaluation.   Assessment & Plan    Assessment and Plan:  Gross hematuria probably secondary to traumatic Foley replacement in the setting of anticoagulation Patient has history of urinary retention and indwelling Foley placement. Urology consulted, new large bore catheter placed on traction for tamponade and on gentle irrigation to prevent new clot formation. patient had large clots in the Foley catheter tubing underwent several irrigations and currently on CBI with improvement in hematuria. He was started on IV heparin but had to be discontinued  on the morning of 5/18 as he had recurrent hematuria.  His urine is cleared up. Will try IV heparin tonight again and monitor him.  Continue finasteride.  Outpatient voiding trial in the outpatient setting.    History of CLL Follows up with Dr. Alen Blew. CT angio of the chest showed adenopathy, recommend outpatient follow up with Dr Alen Blew.     Essential hypertension Blood pressure parameters are optimal.     Diet-controlled type 2 diabetes mellitus.    History of recent PE/DVT. Patient's aspirin Plavix and Eliquis on hold  given gross hematuria. Pt had a submassive PE less than a month ago, with right heart strain. PCCM consulted as we are unable to anticoagulate due to hematuria , to see if catheter thrombectomy is warranted . A repeat CTA and venous duplex ordered. Repeat CTA is negative for PE, venous duplex showed DVT on the Right side. IR consulted to see if he is a candidate for IVC filter.  Echocardiogram repeated to check for right heart strain.     Limb ischemia S/p right AKA Recently with angiogram and thrombectomy. Aspirin and Plavix are currently on hold due to gross hematuria.   Continue with Crestor.    Recent wide complex tachycardia secondary to SVT. Continue with amiodarone 200 mg daily.  Patient having episodic SVT with paroxysms of pain As needed metoprolol  Hyponatremia:  From mild fluid overload?  Monitor.  Asymptomatic.  Recheck BMP in am.   Acute Anemia of blood loss from hematuria: Hemoglobin dropped from 10.8 to 7.4, will order 1 unit of prbc transfusion.  Check cbc in am.      RN Pressure Injury Documentation: Pressure  Injury 08/05/21 Heel Left Stage 1 -  Intact skin with non-blanchable redness of a localized area usually over a bony prominence. redden and blances Foam was on it already. (Active)  08/05/21 1930  Location: Heel  Location Orientation: Left  Staging: Stage 1 -  Intact skin with non-blanchable redness of a localized area usually over a bony prominence.  Wound Description (Comments): redden and blances Foam was on it already.  Present on Admission:   Dressing Type Foam - Lift dressing to assess site every shift 08/27/21 0900     Pressure Injury 08/05/21 Buttocks Right;Left;Mid Stage 1 -  Intact skin with non-blanchable redness of a localized area usually over a bony prominence. blanches redden (Active)  08/05/21 1930  Location: Buttocks  Location Orientation: Right;Left;Mid  Staging: Stage 1 -  Intact skin with non-blanchable redness of a localized  area usually over a bony prominence.  Wound Description (Comments): blanches redden  Present on Admission:   Dressing Type Foam - Lift dressing to assess site every shift 08/27/21 0900   Local wound care.      Estimated body mass index is 29.84 kg/m as calculated from the following:   Height as of this encounter: 6' (1.829 m).   Weight as of this encounter: 99.8 kg.  Code Status: full code.  DVT Prophylaxis:  SCDs Start: 08/24/21 1207   Level of Care: Level of care: Med-Surg Family Communication: none at bedside  Disposition Plan:     Remains inpatient appropriate:  prbc transfusion.   Procedures:  CBI.   Consultants:   Urology.  PCCM IR.   Antimicrobials:   Anti-infectives (From admission, onward)    Start     Dose/Rate Route Frequency Ordered Stop   08/25/21 1134  cefTRIAXone (ROCEPHIN) 1 g in sodium chloride 0.9 % 100 mL IVPB        1 g 200 mL/hr over 30 Minutes Intravenous Every 24 hours 08/25/21 1131 08/26/21 1942   08/24/21 0745  cefTRIAXone (ROCEPHIN) 1 g in sodium chloride 0.9 % 100 mL IVPB        1 g 200 mL/hr over 30 Minutes Intravenous  Once 08/24/21 0730 08/24/21 9833        Medications  Scheduled Meds:  amiodarone  200 mg Oral Daily   Chlorhexidine Gluconate Cloth  6 each Topical Daily   docusate sodium  100 mg Oral BID   finasteride  5 mg Oral Daily   ipratropium-albuterol  3 mL Nebulization Q6H   melatonin  5 mg Oral QHS   phenazopyridine  100 mg Oral TID WC   rosuvastatin  20 mg Oral Daily   senna-docusate  2 tablet Oral BID   sodium chloride flush  3 mL Intravenous Q12H   Continuous Infusions:  sodium chloride irrigation     PRN Meds:.acetaminophen **OR** acetaminophen, albuterol, bisacodyl, hydrALAZINE, HYDROmorphone (DILAUDID) injection, metoprolol tartrate, ondansetron **OR** ondansetron (ZOFRAN) IV, polyethylene glycol, prochlorperazine, traMADol    Subjective:   Aurthur Klasen was seen and examined today.   Continuous to  have intermittent sob, relieved by neb treatments.  Some intermittent pain when he gets the urge to urinate.  Objective:   Vitals:   08/27/21 0805 08/27/21 0826 08/27/21 1046 08/27/21 1124  BP: 129/67  140/72 (!) 142/69  Pulse: 78 79 75 75  Resp: '17 18 18 18  '$ Temp: 97.9 F (36.6 C)  98 F (36.7 C) 98.2 F (36.8 C)  TempSrc: Oral  Oral Oral  SpO2: 96% 93% 100% 100%  Weight:  Height:        Intake/Output Summary (Last 24 hours) at 08/27/2021 1336 Last data filed at 08/27/2021 1127 Gross per 24 hour  Intake 5167.8 ml  Output 8300 ml  Net -3132.2 ml    Filed Weights   08/24/21 0527  Weight: 99.8 kg     Exam General exam: Appears calm and comfortable  Respiratory system: Clear to auscultation. Respiratory effort normal. Cardiovascular system: S1 & S2 heard, RRR. No JVD, No pedal edema. Gastrointestinal system: Abdomen is nondistended, soft and nontender. Normal bowel sounds heard. Central nervous system: Alert and oriented. No focal neurological deficits. Extremities: Symmetric 5 x 5 power. Skin: No rashes, lesions or ulcers Psychiatry: Judgement and insight appear normal. Mood & affect appropriate.      Data Reviewed:  I have personally reviewed following labs and imaging studies   CBC Lab Results  Component Value Date   WBC 23.1 (H) 08/27/2021   RBC 2.61 (L) 08/27/2021   HGB 7.4 (L) 08/27/2021   HCT 23.4 (L) 08/27/2021   MCV 89.7 08/27/2021   MCH 28.4 08/27/2021   PLT 258 08/27/2021   MCHC 31.6 08/27/2021   RDW 18.7 (H) 08/27/2021   LYMPHSABS 9.6 (H) 08/24/2021   MONOABS 9.2 (H) 08/24/2021   EOSABS 0.2 08/24/2021   BASOSABS 0.1 93/71/6967     Last metabolic panel Lab Results  Component Value Date   NA 131 (L) 08/25/2021   K 4.6 08/25/2021   CL 98 08/25/2021   CO2 26 08/25/2021   BUN 18 08/25/2021   CREATININE 0.57 (L) 08/25/2021   GLUCOSE 114 (H) 08/25/2021   GFRNONAA >60 08/25/2021   GFRAA >60 01/08/2020   CALCIUM 8.8 (L) 08/25/2021    PROT 5.1 (L) 07/14/2021   ALBUMIN 2.2 (L) 07/14/2021   BILITOT 0.4 07/14/2021   ALKPHOS 214 (H) 07/14/2021   AST 28 07/14/2021   ALT 89 (H) 07/14/2021   ANIONGAP 7 08/25/2021    CBG (last 3)  No results for input(s): GLUCAP in the last 72 hours.    Coagulation Profile: No results for input(s): INR, PROTIME in the last 168 hours.   Radiology Studies: CT Angio Chest Pulmonary Embolism (PE) W or WO Contrast  Result Date: 08/26/2021 CLINICAL DATA:  CLL, history of pulmonary embolism. EXAM: CT ANGIOGRAPHY CHEST WITH CONTRAST TECHNIQUE: Multidetector CT imaging of the chest was performed using the standard protocol during bolus administration of intravenous contrast. Multiplanar CT image reconstructions and MIPs were obtained to evaluate the vascular anatomy. RADIATION DOSE REDUCTION: This exam was performed according to the departmental dose-optimization program which includes automated exposure control, adjustment of the mA and/or kV according to patient size and/or use of iterative reconstruction technique. CONTRAST:  17m OMNIPAQUE IOHEXOL 350 MG/ML SOLN COMPARISON:  08/08/2021 FINDINGS: Cardiovascular: Satisfactory opacification of the pulmonary arteries to the segmental level. No evidence of pulmonary embolism. Previously noted lobar and segmental pulmonary emboli in the left lower lobe, right upper lobe, right middle lobe, and right lower lobe is no longer seen. Normal heart size. No pericardial effusion. No reflux of contrast into the hepatic veins. Left chest cardiac device. Mediastinum/Nodes: Prominent supraclavicular, axial, and mediastinal lymph nodes, the majority of which are not enlarged by CT size criteria do appear larger than on the prior exam. For example a right paratracheal lymph node measures up to 1.1 cm in short axis (series 7, image 70), previously 0.6 cm. A 1.6 cm left prevascular lymph node measures up to 1.6 cm in short axis, previously  0.9 cm; this lymph node does meet  size criteria for lymphadenopathy. The imaged thyroid trachea, and esophagus are unremarkable. Lungs/Pleura: Unchanged 4 mm nodule in the right lower lobe (series 7, image 195). No new pulmonary nodules. No focal pulmonary opacity or pleural effusion. Redemonstrated left apical scarring. Upper Abdomen: Redemonstrated hypervascular splenic lesion. No other acute process in the abdomen. Musculoskeletal: No chest wall abnormality. No acute or significant osseous findings. Review of the MIP images confirms the above findings. IMPRESSION: 1. Negative for pulmonary embolism. Previously noted pulmonary emboli are no longer seen. 2. Enlarged mediastinal lymph node, with other prominent supraclavicular, axial, and mediastinal lymph nodes, which have increased in size since the prior exam. These could be reactive but are concerning given the patient's history of CLL. Electronically Signed   By: Merilyn Baba M.D.   On: 08/26/2021 15:01   DG CHEST PORT 1 VIEW  Result Date: 08/26/2021 CLINICAL DATA:  Shortness of breath EXAM: PORTABLE CHEST 1 VIEW COMPARISON:  08/07/2021 FINDINGS: The heart size and mediastinal contours are within normal limits. Both lungs are clear. The visualized skeletal structures are unremarkable. Unchanged position of left chest wall pacemaker leads. IMPRESSION: No active disease. Electronically Signed   By: Ulyses Jarred M.D.   On: 08/26/2021 00:41   VAS [place] LOWER EXTREMITY VENOUS (DVT)  Result Date: 08/26/2021  Lower Venous DVT Study Patient Name:  YURIEL LOPEZMARTINEZ  Date of Exam:   08/26/2021 Medical Rec #: [id number]         Accession #:    [id number] Date of Birth: 22-Nov-1950          Patient Gender: M Patient Age:   57 years Exam Location:  [facility] Procedure:      VAS [place] LOWER EXTREMITY VENOUS (DVT) Referring Phys: Andres Labrum --------------------------------------------------------------------------------  Indications: PE from 08/04/21.  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  Anticoagulation: Eliquis prior to admission. Limitations: Poor ultrasound/tissue interface. Comparison Study: Previous exam on 08/04/21 was positive for extensive DVT in                   BLE. LLE thrombectomy performed on 08/06/21. Performing Technologist: Rogelia Rohrer RVT, RDMS  Examination Guidelines: A complete evaluation includes B-mode imaging, spectral Doppler, color Doppler, and power Doppler as needed of all accessible portions of each vessel. Bilateral testing is considered an integral part of a complete examination. Limited examinations for reoccurring indications may be performed as noted. The reflux portion of the exam is performed with the patient in reverse Trendelenburg.  +-------+---------------+---------+-----------+----------+-----------------+ RIGHT  CompressibilityPhasicitySpontaneityPropertiesThrombus Aging    +-------+---------------+---------+-----------+----------+-----------------+ CFV    None           No       No                   Age Indeterminate +-------+---------------+---------+-----------+----------+-----------------+ SFJ    None                                         Age Indeterminate +-------+---------------+---------+-----------+----------+-----------------+ FV ProxNone           No       No                   Age Indeterminate +-------+---------------+---------+-----------+----------+-----------------+ FV Mid None           No       No                                     +-------+---------------+---------+-----------+----------+-----------------+  PFV                   No       No                   Age Indeterminate +-------+---------------+---------+-----------+----------+-----------------+ EIV    None           No       No                   Age Indeterminate +-------+---------------+---------+-----------+----------+-----------------+ RT AKA  +---------+---------------+---------+-----------+----------+--------------+ LEFT      CompressibilityPhasicitySpontaneityPropertiesThrombus Aging +---------+---------------+---------+-----------+----------+--------------+ CFV      Full           Yes      Yes                                 +---------+---------------+---------+-----------+----------+--------------+ SFJ      Full                                                        +---------+---------------+---------+-----------+----------+--------------+ FV Prox  Full           Yes      Yes                                 +---------+---------------+---------+-----------+----------+--------------+ FV Mid   Full           Yes      Yes                                 +---------+---------------+---------+-----------+----------+--------------+ FV DistalFull           Yes      Yes                                 +---------+---------------+---------+-----------+----------+--------------+ PFV      Full                                                        +---------+---------------+---------+-----------+----------+--------------+ POP      Full           Yes      Yes                                 +---------+---------------+---------+-----------+----------+--------------+ PTV      Full                                                        +---------+---------------+---------+-----------+----------+--------------+ PERO     Full                                                        +---------+---------------+---------+-----------+----------+--------------+  Summary: RIGHT: - Findings consistent with age indeterminate deep vein thrombosis involving the right common femoral vein, SF junction, right femoral vein, right proximal profunda vein, and external iliac vein. - Findings appear essentially unchanged compared to previous examination. - Ultrasound characteristics of enlarged lymph nodes are noted in the groin.  LEFT: - There is no evidence of deep vein thrombosis in the lower  extremity.  - Ultrasound characteristics of enlarged lymph nodes noted in the groin.  *See table(s) above for measurements and observations.    Preliminary    ECHOCARDIOGRAM LIMITED  Result Date: 08/26/2021    ECHOCARDIOGRAM LIMITED REPORT   Patient Name:   LIONARDO HAZE Date of Exam: 08/26/2021 Medical Rec #:  [id number]        Height:       72.0 in Accession #:    [id number]       Weight:       220.0 lb Date of Birth:  March 26, 1951         BSA:          2.219 m Patient Age:    41 years         BP:           135/69 mmHg Patient Gender: M                HR:           85 bpm. Exam Location:  Inpatient Procedure: 2D Echo and Limited Echo Indications:    Pulmonary embolism  History:        Patient has prior history of Echocardiogram examinations, most                 recent 08/07/2021. Risk Factors:Hypertension and Diabetes.  Sonographer:    Jefferey Pica Referring Phys: [id number] [place]  1. Since TTE 08/07/21 RV function improved LV function worse with RWMA in septum and apex.  2. Global hypokinesis worse in the septum and apex . Left ventricular ejection fraction, by estimation, is 35 to 40%. The left ventricle has moderately decreased function. The left ventricle has no regional wall motion abnormalities. The left ventricular internal cavity size was moderately dilated. There is mild left ventricular hypertrophy.  3. Right ventricular systolic function is normal. The right ventricular size is normal.  4. Left atrial size was moderately dilated.  5. The mitral valve is abnormal. No evidence of mitral valve regurgitation. No evidence of mitral stenosis.  6. The aortic valve is tricuspid. There is mild calcification of the aortic valve. Aortic valve regurgitation is not visualized. Aortic valve sclerosis is present, with no evidence of aortic valve stenosis.  7. Aortic dilatation noted. There is mild dilatation of the ascending aorta, measuring 39 mm.  8. The inferior vena cava is normal in size  with greater than 50% respiratory variability, suggesting right atrial pressure of 3 mmHg. FINDINGS  Left Ventricle: Global hypokinesis worse in the septum and apex. Left ventricular ejection fraction, by estimation, is 35 to 40%. The left ventricle has moderately decreased function. The left ventricle has no regional wall motion abnormalities. The left ventricular internal cavity size was moderately dilated. There is mild left ventricular hypertrophy. Right Ventricle: The right ventricular size is normal. No increase in right ventricular wall thickness. Right ventricular systolic function is normal. Left Atrium: Left atrial size was moderately dilated. Right Atrium: Right atrial size was normal in size. Pericardium: There is no evidence of pericardial effusion. Mitral Valve: The mitral valve is  abnormal. There is mild thickening of the mitral valve leaflet(s). There is mild calcification of the mitral valve leaflet(s). Mild mitral annular calcification. No evidence of mitral valve stenosis. Tricuspid Valve: The tricuspid valve is normal in structure. Tricuspid valve regurgitation is not demonstrated. No evidence of tricuspid stenosis. Aortic Valve: The aortic valve is tricuspid. There is mild calcification of the aortic valve. Aortic valve regurgitation is not visualized. Aortic valve sclerosis is present, with no evidence of aortic valve stenosis. Pulmonic Valve: The pulmonic valve was normal in structure. Pulmonic valve regurgitation is not visualized. No evidence of pulmonic stenosis. Aorta: Aortic dilatation noted. There is mild dilatation of the ascending aorta, measuring 39 mm. Venous: The inferior vena cava is normal in size with greater than 50% respiratory variability, suggesting right atrial pressure of 3 mmHg. IAS/Shunts: No atrial level shunt detected by color flow Doppler. Additional Comments: Since TTE 08/07/21 RV function improved LV function worse with RWMA in septum and apex. LEFT VENTRICLE PLAX 2D  LVIDd:         5.90 cm LVIDs:         4.80 cm LV PW:         1.30 cm LV IVS:        1.20 cm LVOT diam:     2.10 cm LVOT Area:     3.46 cm  IVC IVC diam: 2.00 cm LEFT ATRIUM             Index        RIGHT ATRIUM           Index LA diam:        4.40 cm 1.98 cm/m   RA Area:     18.20 cm LA Vol (A2C):   80.4 ml 36.24 ml/m  RA Volume:   49.40 ml  22.27 ml/m LA Vol (A4C):   74.4 ml 33.53 ml/m LA Biplane Vol: 78.6 ml 35.43 ml/m   AORTA Ao Root diam: 4.10 cm Ao Asc diam:  3.90 cm  SHUNTS Systemic Diam: 2.10 cm Jenkins Rouge MD Electronically signed by Jenkins Rouge MD Signature Date/Time: 08/26/2021/12:02:00 PM    Final        Hosie Poisson M.D. [facility] 08/27/2021, 1:36 PM  Available via Epic secure chat 7am-7pm After 7 pm, please refer to night coverage provider listed on amion.

## 2021-08-27 NOTE — Progress Notes (Signed)
[place] for heparin Indication: submassive PE 08/08/21 and BL DVT   No Known Allergies  Patient Measurements: Height: 6' (182.9 cm) Weight: 99.8 kg (220 lb) IBW/kg (Calculated) : 77.6 Heparin Dosing Weight: 98 kg   Vital Signs: Temp: 98.5 F (36.9 C) (05/19 1526) Temp Source: Oral (05/19 1526) BP: 151/72 (05/19 1526) Pulse Rate: 80 (05/19 1526)  Labs: Recent Labs    08/25/21 0512 08/26/21 0115 08/26/21 0953 08/27/21 0301  HGB 8.6* 7.7*  --  7.4*  HCT 27.7* 26.1*  --  23.4*  PLT 349 316  --  258  APTT  --  52* 29  --   HEPARINUNFRC  --  0.44  --   --   CREATININE 0.57*  --   --   --   TROPONINIHS  --   --  16  --      Estimated Creatinine Clearance: 102.1 mL/min (A) (by C-G formula based on SCr of 0.57 mg/dL (L)).   Medical History: Past Medical History:  Diagnosis Date   BPH (benign prostatic hyperplasia)    CLL (chronic lymphocytic leukemia) (HCC)    Hypertension    Pacemaker    [employer] Device   Pulmonary embolism (HCC)    PVD (peripheral vascular disease) (HCC)    s/p stent, thrombectomy     Assessment: 71 yo M on apixaban for submassive PE 08/08/21 and BL DVTs 08/05/21. Apixaban held for hematuria now improved on continuous bladder irrigation. Apixaban last dose 5/15 ~18:00. Pharmacy consulted for heparin.    Heparin was discontinued at 7:30 due to worsening hematuria. Scheduled aPTT was drawn after and is subtherapeutic as expected. Discussed with MD, hold heparin for now, will reconsult when safe to continue.   PM f/u > discussed with Dr. Karleen Hampshire - re-attempt IV heparin to see if he bleeds before initiating po anticoagulation.    Goal of Therapy:  Heparin level 0.3- 0.5 units/mL - will target lower end of therapeutic range given recent bleeding. aPTT 66-102 seconds Monitor platelets by anticoagulation protocol: Yes   Plan:  Reinitiate IV heparin at 1350 units/hr. Check heparin level 8 hrs after gtt  starts. Daily heparin level and CBC.  [place] Crane, Roylene Reason, BCCP Clinical Pharmacist  08/27/2021 6:05 PM   [facility] pharmacy phone numbers are listed on [email or web address]

## 2021-08-27 NOTE — Progress Notes (Signed)
PT Cancellation Note  Patient Details Name: Douglas [name] MRN: [id number] DOB: [birth year]   Cancelled Treatment:    Reason Eval/Treat Not Completed: Patient declined, no reason specified (Pt has significant concerns about starting PT too soon due to inc hematuria even with limited movement.  States he is not allowed to restart his blood thinners until hematuria resolves.  Wants to wait until Monday to start therapy.  MD notified.)Pt educated on performing LE therex in bed to tolerance to maintain strength, but no eval completed at this time.  Wynne Jury A. Ashby Leflore, PT, DPT Acute Rehabilitation Services Office: [place] 08/27/2021, 9:07 AM

## 2021-08-27 NOTE — Progress Notes (Signed)
Subjective/Chief Complaint:  1 - Gross Hematuria / Foley Trauma - new gross hematuria with clots noted after home health trauamtic foley change 08/23/21. He is on blood thinners for DVT/PE/PAD. CT 5/16 with large prostate and diffuse adenopathy. No renal lesions / stones. Improving dramatically on large catheter with irrigation and traction. Stopped 5/19.    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 NO recent cysto / GU eval. On alpha blockers at baseline x years, finasteride added 07/2021. Prostate vol 46m with median lobe by CT 08/2021.   3 - Widely Metastatic Cancer - known CLL. Diffuse adenopathy including pelvic on CT 08/2021, PSA 8 (not consistent with prostate source cancer).    Today "Douglas [name] is continuing to improve. Some recurrent gross hematuria on heparin gtt, stopped promptly with holding. Current plan is possible IVC filter.      Objective: Vital signs in last 24 hours: Temp:  [97.7 F (36.5 C)-98.6 F (37 C)] 98.5 F (36.9 C) (05/19 1526) Pulse Rate:  [75-85] 80 (05/19 1526) Resp:  [16-18] 17 (05/19 1526) BP: (101-155)/(62-73) 151/72 (05/19 1526) SpO2:  [93 %-100 %] 97 % (05/19 1526) Last BM Date : 08/24/21  Intake/Output from previous day: 05/18 0701 - 05/19 0700 In: 8017.8 [P.O.:240; I.V.:1777.8] Out: 9050 [Urine:9050] Intake/Output this shift: Total I/O In: 510 [P.O.:150; Blood:360] Out: 3700 [Urine:3700]   EXAM: NAD, AOx3 Non-labored breathing on RA Stable mild truncal obesity. Resolution of prior gross hematuira now off bladder irrigation. Irrigation port plugged and setup taken down.  Stable RLE BKA stump.   Lab Results:  Recent Labs    08/26/21 0115 08/27/21 0301  WBC 38.7* 23.1*  HGB 7.7* 7.4*  HCT 26.1* 23.4*  PLT 316 258   BMET Recent Labs    08/25/21 0512  NA 131*  K 4.6  CL 98  CO2 26  GLUCOSE 114*  BUN 18  CREATININE 0.57*  CALCIUM 8.8*   PT/INR No results for  input(s): LABPROT, INR in the last 72 hours. ABG No results for input(s): PHART, HCO3 in the last 72 hours.  Invalid input(s): PCO2, PO2  Studies/Results: CT Angio Chest Pulmonary Embolism (PE) W or WO Contrast  Result Date: 08/26/2021 CLINICAL DATA:  CLL, history of pulmonary embolism. EXAM: CT ANGIOGRAPHY CHEST WITH CONTRAST TECHNIQUE: Multidetector CT imaging of the chest was performed using the standard protocol during bolus administration of intravenous contrast. Multiplanar CT image reconstructions and MIPs were obtained to evaluate the vascular anatomy. RADIATION DOSE REDUCTION: This exam was performed according to the departmental dose-optimization program which includes automated exposure control, adjustment of the mA and/or kV according to patient size and/or use of iterative reconstruction technique. CONTRAST:  1040mOMNIPAQUE IOHEXOL 350 MG/ML SOLN COMPARISON:  08/08/2021 FINDINGS: Cardiovascular: Satisfactory opacification of the pulmonary arteries to the segmental level. No evidence of pulmonary embolism. Previously noted lobar and segmental pulmonary emboli in the left lower lobe, right upper lobe, right middle lobe, and right lower lobe is no longer seen. Normal heart size. No pericardial effusion. No reflux of contrast into the hepatic veins. Left chest cardiac device. Mediastinum/Nodes: Prominent supraclavicular, axial, and mediastinal lymph nodes, the majority of which are not enlarged by CT size criteria do appear larger than on the prior exam. For example a right paratracheal lymph node measures up to 1.1 cm in short axis (series 7, image 70), previously 0.6 cm. A 1.6 cm left prevascular lymph node measures up to 1.6 cm  in short axis, previously 0.9 cm; this lymph node does meet size criteria for lymphadenopathy. The imaged thyroid trachea, and esophagus are unremarkable. Lungs/Pleura: Unchanged 4 mm nodule in the right lower lobe (series 7, image 195). No new pulmonary nodules. No  focal pulmonary opacity or pleural effusion. Redemonstrated left apical scarring. Upper Abdomen: Redemonstrated hypervascular splenic lesion. No other acute process in the abdomen. Musculoskeletal: No chest wall abnormality. No acute or significant osseous findings. Review of the MIP images confirms the above findings. IMPRESSION: 1. Negative for pulmonary embolism. Previously noted pulmonary emboli are no longer seen. 2. Enlarged mediastinal lymph node, with other prominent supraclavicular, axial, and mediastinal lymph nodes, which have increased in size since the prior exam. These could be reactive but are concerning given the patient's history of CLL. Electronically Signed   By: Merilyn Baba M.D.   On: 08/26/2021 15:01   DG CHEST PORT 1 VIEW  Result Date: 08/26/2021 CLINICAL DATA:  Shortness of breath EXAM: PORTABLE CHEST 1 VIEW COMPARISON:  08/07/2021 FINDINGS: The heart size and mediastinal contours are within normal limits. Both lungs are clear. The visualized skeletal structures are unremarkable. Unchanged position of left chest wall pacemaker leads. IMPRESSION: No active disease. Electronically Signed   By: Ulyses Jarred M.D.   On: 08/26/2021 00:41   VAS [place] LOWER EXTREMITY VENOUS (DVT)  Result Date: 08/26/2021  Lower Venous DVT Study Patient Name:  Douglas [name]  Date of Exam:   08/26/2021 Medical Rec #: [id number]         Accession #:    [id number] Date of Birth: [birth year]          Patient Gender: M Patient Age:   71 years Exam Location:  [facility] Procedure:      VAS [place] LOWER EXTREMITY VENOUS (DVT) Referring Phys: Andres Labrum --------------------------------------------------------------------------------  Indications: PE from 08/04/21.  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 Anticoagulation: Eliquis prior to admission. Limitations: Poor ultrasound/tissue interface. Comparison Study: Previous exam on 08/04/21 was positive for extensive DVT in                   BLE. LLE thrombectomy  performed on 08/06/21. Performing Technologist: Rogelia Rohrer RVT, RDMS  Examination Guidelines: A complete evaluation includes B-mode imaging, spectral Doppler, color Doppler, and power Doppler as needed of all accessible portions of each vessel. Bilateral testing is considered an integral part of a complete examination. Limited examinations for reoccurring indications may be performed as noted. The reflux portion of the exam is performed with the patient in reverse Trendelenburg.  +-------+---------------+---------+-----------+----------+-----------------+ RIGHT  CompressibilityPhasicitySpontaneityPropertiesThrombus Aging    +-------+---------------+---------+-----------+----------+-----------------+ CFV    None           No       No                   Age Indeterminate +-------+---------------+---------+-----------+----------+-----------------+ SFJ    None                                         Age Indeterminate +-------+---------------+---------+-----------+----------+-----------------+ FV ProxNone           No       No                   Age Indeterminate +-------+---------------+---------+-----------+----------+-----------------+ FV Mid None           No  No                                     +-------+---------------+---------+-----------+----------+-----------------+ PFV                   No       No                   Age Indeterminate +-------+---------------+---------+-----------+----------+-----------------+ EIV    None           No       No                   Age Indeterminate +-------+---------------+---------+-----------+----------+-----------------+ RT AKA  +---------+---------------+---------+-----------+----------+--------------+ LEFT     CompressibilityPhasicitySpontaneityPropertiesThrombus Aging +---------+---------------+---------+-----------+----------+--------------+ CFV      Full           Yes      Yes                                  +---------+---------------+---------+-----------+----------+--------------+ SFJ      Full                                                        +---------+---------------+---------+-----------+----------+--------------+ FV Prox  Full           Yes      Yes                                 +---------+---------------+---------+-----------+----------+--------------+ FV Mid   Full           Yes      Yes                                 +---------+---------------+---------+-----------+----------+--------------+ FV DistalFull           Yes      Yes                                 +---------+---------------+---------+-----------+----------+--------------+ PFV      Full                                                        +---------+---------------+---------+-----------+----------+--------------+ POP      Full           Yes      Yes                                 +---------+---------------+---------+-----------+----------+--------------+ PTV      Full                                                        +---------+---------------+---------+-----------+----------+--------------+  PERO     Full                                                        +---------+---------------+---------+-----------+----------+--------------+    Summary: RIGHT: - Findings consistent with age indeterminate deep vein thrombosis involving the right common femoral vein, SF junction, right femoral vein, right proximal profunda vein, and external iliac vein. - Findings appear essentially unchanged compared to previous examination. - Ultrasound characteristics of enlarged lymph nodes are noted in the groin.  LEFT: - There is no evidence of deep vein thrombosis in the lower extremity.  - Ultrasound characteristics of enlarged lymph nodes noted in the groin.  *See table(s) above for measurements and observations.    Preliminary    ECHOCARDIOGRAM LIMITED  Result Date: 08/26/2021     ECHOCARDIOGRAM LIMITED REPORT   Patient Name:   Douglas [name] Date of Exam: 08/26/2021 Medical Rec #:  [id number]        Height:       72.0 in Accession #:    [id number]       Weight:       220.0 lb Date of Birth:  Feb 08, 1951         BSA:          2.219 m Patient Age:    71 years         BP:           135/69 mmHg Patient Gender: M                HR:           85 bpm. Exam Location:  Inpatient Procedure: 2D Echo and Limited Echo Indications:    Pulmonary embolism  History:        Patient has prior history of Echocardiogram examinations, most                 recent 08/07/2021. Risk Factors:Hypertension and Diabetes.  Sonographer:    Jefferey Pica Referring Phys: [id number] [place]  1. Since TTE 08/07/21 RV function improved LV function worse with RWMA in septum and apex.  2. Global hypokinesis worse in the septum and apex . Left ventricular ejection fraction, by estimation, is 35 to 40%. The left ventricle has moderately decreased function. The left ventricle has no regional wall motion abnormalities. The left ventricular internal cavity size was moderately dilated. There is mild left ventricular hypertrophy.  3. Right ventricular systolic function is normal. The right ventricular size is normal.  4. Left atrial size was moderately dilated.  5. The mitral valve is abnormal. No evidence of mitral valve regurgitation. No evidence of mitral stenosis.  6. The aortic valve is tricuspid. There is mild calcification of the aortic valve. Aortic valve regurgitation is not visualized. Aortic valve sclerosis is present, with no evidence of aortic valve stenosis.  7. Aortic dilatation noted. There is mild dilatation of the ascending aorta, measuring 39 mm.  8. The inferior vena cava is normal in size with greater than 50% respiratory variability, suggesting right atrial pressure of 3 mmHg. FINDINGS  Left Ventricle: Global hypokinesis worse in the septum and apex. Left ventricular ejection fraction, by estimation,  is 35 to 40%. The left ventricle has moderately decreased function. The left ventricle has no regional wall motion abnormalities. The left  ventricular internal cavity size was moderately dilated. There is mild left ventricular hypertrophy. Right Ventricle: The right ventricular size is normal. No increase in right ventricular wall thickness. Right ventricular systolic function is normal. Left Atrium: Left atrial size was moderately dilated. Right Atrium: Right atrial size was normal in size. Pericardium: There is no evidence of pericardial effusion. Mitral Valve: The mitral valve is abnormal. There is mild thickening of the mitral valve leaflet(s). There is mild calcification of the mitral valve leaflet(s). Mild mitral annular calcification. No evidence of mitral valve stenosis. Tricuspid Valve: The tricuspid valve is normal in structure. Tricuspid valve regurgitation is not demonstrated. No evidence of tricuspid stenosis. Aortic Valve: The aortic valve is tricuspid. There is mild calcification of the aortic valve. Aortic valve regurgitation is not visualized. Aortic valve sclerosis is present, with no evidence of aortic valve stenosis. Pulmonic Valve: The pulmonic valve was normal in structure. Pulmonic valve regurgitation is not visualized. No evidence of pulmonic stenosis. Aorta: Aortic dilatation noted. There is mild dilatation of the ascending aorta, measuring 39 mm. Venous: The inferior vena cava is normal in size with greater than 50% respiratory variability, suggesting right atrial pressure of 3 mmHg. IAS/Shunts: No atrial level shunt detected by color flow Doppler. Additional Comments: Since TTE 08/07/21 RV function improved LV function worse with RWMA in septum and apex. LEFT VENTRICLE PLAX 2D LVIDd:         5.90 cm LVIDs:         4.80 cm LV PW:         1.30 cm LV IVS:        1.20 cm LVOT diam:     2.10 cm LVOT Area:     3.46 cm  IVC IVC diam: 2.00 cm LEFT ATRIUM             Index        RIGHT ATRIUM            Index LA diam:        4.40 cm 1.98 cm/m   RA Area:     18.20 cm LA Vol (A2C):   80.4 ml 36.24 ml/m  RA Volume:   49.40 ml  22.27 ml/m LA Vol (A4C):   74.4 ml 33.53 ml/m LA Biplane Vol: 78.6 ml 35.43 ml/m   AORTA Ao Root diam: 4.10 cm Ao Asc diam:  3.90 cm  SHUNTS Systemic Diam: 2.10 cm Jenkins Rouge MD Electronically signed by Jenkins Rouge MD Signature Date/Time: 08/26/2021/12:02:00 PM    Final     Anti-infectives: Anti-infectives (From admission, onward)    Start     Dose/Rate Route Frequency Ordered Stop   08/25/21 1134  cefTRIAXone (ROCEPHIN) 1 g in sodium chloride 0.9 % 100 mL IVPB        1 g 200 mL/hr over 30 Minutes Intravenous Every 24 hours 08/25/21 1131 08/26/21 1942   08/24/21 0745  cefTRIAXone (ROCEPHIN) 1 g in sodium chloride 0.9 % 100 mL IVPB        1 g 200 mL/hr over 30 Minutes Intravenous  Once 08/24/21 0730 08/24/21 9407       Assessment/Plan:   1 - Gross Hematuria / Foley Trauma - present hematuria from foley trauma in setting of anticoagulation, now resolved with few days catheeter traction / irrigation. I feel OK to retart PO blood thinners v. Proceed with IVC filter and hold a bit longer. He is certainly at high risk of rebleeding in short term.    2 -  Urinary Retention / Very Large Prostate - continue finasteride in house and at [place]. Will arrange for trial of void in about a month in outpatient setting. THis is already arranged.    3 - Widely Metastatic Cancer  - from known CLL. PSA not consistent with prostate source.      Alexis Frock 08/27/2021

## 2021-08-27 NOTE — Progress Notes (Signed)
Care nurse noted mild edema and redness at the dependent portion of the right forearm PIV site. IV removed, no pain at the site, provider notified, arm elevated on multiple pillows with cool compress to the site.   PIV site moved to left arm with new tubing and fluid bag.

## 2021-08-27 NOTE — Progress Notes (Signed)
No gross bleeding noted overnight. Pt urine has been yellow and clear without any clots. The CBI running at slow rate.   Right arm redness and swelling improved with elevation and cold pack.

## 2021-08-28 LAB — TYPE AND SCREEN
ABO/RH(D): A POS
Antibody Screen: NEGATIVE
Unit division: 0

## 2021-08-28 LAB — CBC
HCT: 27.5 % — ABNORMAL LOW (ref 39.0–52.0)
Hemoglobin: 8.4 g/dL — ABNORMAL LOW (ref 13.0–17.0)
MCH: 27.4 pg (ref 26.0–34.0)
MCHC: 30.5 g/dL (ref 30.0–36.0)
MCV: 89.6 fL (ref 80.0–100.0)
Platelets: 260 10*3/uL (ref 150–400)
RBC: 3.07 MIL/uL — ABNORMAL LOW (ref 4.22–5.81)
RDW: 18.6 % — ABNORMAL HIGH (ref 11.5–15.5)
WBC: 22.6 10*3/uL — ABNORMAL HIGH (ref 4.0–10.5)
nRBC: 0.1 % (ref 0.0–0.2)

## 2021-08-28 LAB — BASIC METABOLIC PANEL
Anion gap: 8 (ref 5–15)
BUN: 8 mg/dL (ref 8–23)
CO2: 30 mmol/L (ref 22–32)
Calcium: 8.7 mg/dL — ABNORMAL LOW (ref 8.9–10.3)
Chloride: 96 mmol/L — ABNORMAL LOW (ref 98–111)
Creatinine, Ser: 0.56 mg/dL — ABNORMAL LOW (ref 0.61–1.24)
GFR, Estimated: >60 mL/min (ref >60)
Glucose, Bld: 121 mg/dL — ABNORMAL HIGH (ref 70–99)
Potassium: 4 mmol/L (ref 3.5–5.1)
Sodium: 134 mmol/L — ABNORMAL LOW (ref 135–145)

## 2021-08-28 LAB — HEPARIN LEVEL (UNFRACTIONATED)
Heparin Unfractionated: 0.16 IU/mL — ABNORMAL LOW (ref 0.30–0.70)
Heparin Unfractionated: 0.14 IU/mL — ABNORMAL LOW (ref 0.30–0.70)

## 2021-08-28 LAB — BPAM RBC
Blood Product Expiration Date: 202305262359
ISSUE DATE / TIME: 202305191105
Unit Type and Rh: 6200

## 2021-08-28 NOTE — Progress Notes (Signed)
ANTICOAGULATION CONSULT NOTE  Pharmacy Consult for heparin Indication: submassive PE 08/08/21 and BL DVT   No Known Allergies  Patient Measurements: Height: 6' (182.9 cm) Weight: 99.8 kg (220 lb) IBW/kg (Calculated) : 77.6 Heparin Dosing Weight: 98 kg   Vital Signs: Temp: 98 F (36.7 C) (05/20 1458) Temp Source: Oral (05/20 1458) BP: 134/70 (05/20 1458) Pulse Rate: 67 (05/20 1458)  Labs: Recent Labs    08/26/21 0115 08/26/21 0953 08/27/21 0301 08/28/21 0300 08/28/21 1422  HGB 7.7*  --  7.4* 8.4*  --   HCT 26.1*  --  23.4* 27.5*  --   PLT 316  --  258 260  --   APTT 52* 29  --   --   --   HEPARINUNFRC 0.44  --   --  0.16* 0.14*  CREATININE  --   --   --  0.56*  --   TROPONINIHS  --  16  --   --   --      Estimated Creatinine Clearance: 102.1 mL/min (A) (by C-G formula based on SCr of 0.56 mg/dL (L)).   Medical History: Past Medical History:  Diagnosis Date   BPH (benign prostatic hyperplasia)    CLL (chronic lymphocytic leukemia) (HCC)    Hypertension    Pacemaker    [employer]tr[employer] Device   Pulmonary embolism (HCC)    PVD (peripheral vascular disease) (HCC)    s/p stent, thrombectomy     Assessment: 71 yo M on apixaban for submassive PE 08/08/21 and BL DVTs 08/05/21. Apixaban held for hematuria now improved on continuous bladder irrigation. Apixaban last dose 5/15 ~18:00. Pharmacy consulted for heparin.    5/19: Heparin was discontinued at 7:30 due to worsening hematuria. Scheduled aPTT was drawn after and is subtherapeutic as expected. Discussed with Dr. Karleen Hampshire - re-attempt IV heparin to see if he bleeds before initiating po anticoagulation.  Re-started around 6pm.   5/20: Heparin level subtherapeutic 0.14 at heparin 1500 units/hr. No issues with heparin infusion. Per RN, a small amount of blood in catheter. Discussed with Dr. Karleen Hampshire we will increase by around 1unit/kg/h and monitor closely- planning to place IVC filter on 5/22.   Goal of Therapy:  Heparin level  0.3- 0.5 units/mL (targeting lower end of therapeutic range d/t recent bleeding) aPTT 66-102 seconds Monitor platelets by anticoagulation protocol: Yes   Plan:  Increase heparin infusion to 1600 units/hr Check heparin level in 8 hours and daily while on heparin Continue to monitor H&H and platelets    Thank you for allowing pharmacy to be a part of this patient's care.  Lestine Box, PharmD PGY2 Infectious Diseases Pharmacy Resident   Please check [email or web address] for unit-specific pharmacy phone numbers

## 2021-08-28 NOTE — Consult Note (Signed)
[facility] [place]  ASSESSMENT / PLAN: 71 y.o. male with recent DVT/PE and frank hematuria requiring continuous bladder irrigation. Given the inability to anticoagulate safely in this setting, an IVC filter seems appropriate. Will plan to place filter on Monday.  CHIEF COMPLAINT: hematuria  HISTORY OF PRESENT ILLNESS: Douglas [name] is a 71 y.o. male admitted to the internal medicine service for gross hematuria.  He is had multiple issues with urinary retention and has had a chronic indwelling Foley.  Urology service has initiated continuous bladder irrigation which did clear up his hematuria.  He has been anticoagulated for DVT and pulmonary embolism.  He has a complex past vascular history (see below).  Given the risk for resuming anticoagulation, vascular consultation was requested for placement of an IVC filter.  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 Negative history of coronary artery disease.  Negative history of diabetes mellitus.  Positive history of smoking.  Positive history of hypertension.  Negative history of chronic kidney disease.   Negative history of chronic obstructive pulmonary disease  FUNCTIONAL STATUS: ECOG performance status: (3) Capable of limited self-care, confined to bed or chair > 50% of waking hours Ambulatory status: Ambulatory with assistance only  Past Medical History:  Diagnosis Date   BPH (benign prostatic hyperplasia)    CLL (chronic lymphocytic leukemia) (HCC)    Hypertension    Pacemaker    Medtronic Device   Pulmonary  embolism (HCC)    PVD (peripheral vascular disease) ([place])    s/p stent, thrombectomy    Past Surgical History:  Procedure Laterality Date   ABDOMINAL AORTOGRAM W/LOWER EXTREMITY Bilateral 06/21/2021   Procedure: ABDOMINAL AORTOGRAM W/LOWER EXTREMITY;  Surgeon: Waynetta Sandy, MD;  Location: [facility];  Service: Cardiovascular;  Laterality: Bilateral;   ABDOMINAL AORTOGRAM W/LOWER EXTREMITY N/A 08/04/2021   Procedure: ABDOMINAL AORTOGRAM W/LOWER EXTREMITY;  Surgeon: Serafina Mitchell, MD;  Location: [facility];  Service: Cardiovascular;  Laterality: N/A;   AMPUTATION Right 06/25/2021   Procedure: RIGHT BELOW KNEE AMPUTATION;  Surgeon: Waynetta Sandy, MD;  Location: [facility];  Service: Vascular;  Laterality: Right;   AMPUTATION Right 07/19/2021   Procedure: AMPUTATION ABOVE KNEE;  Surgeon: Angelia Mould, MD;  Location: [facility];  Service: Vascular;  Laterality: Right;   FEMORAL-POPLITEAL BYPASS GRAFT Right 06/22/2021   Procedure: RIGHT FEMORAL-POPLITEAL BYPASS WITH VEIN, RIGHT POPLITEAL THROMBECTOMY;  Surgeon: Waynetta Sandy, MD;  Location: [facility];  Service: Vascular;  Laterality: Right;   LEFT HEART CATH AND CORONARY ANGIOGRAPHY N/A 01/18/2021   Procedure: LEFT HEART CATH AND CORONARY ANGIOGRAPHY;  Surgeon: Burnell Blanks, MD;  Location: [facility];  Service: Cardiovascular;  Laterality: N/A;   LOWER EXTREMITY VENOGRAPHY Left 08/06/2021   Procedure: LOWER EXTREMITY VENOGRAPHY;  Surgeon: Cherre Robins, MD;  Location: [facility];  Service: Cardiovascular;  Laterality: Left;   PACEMAKER IMPLANT N/A 01/19/2021   Procedure: PACEMAKER IMPLANT;  Surgeon: Evans Lance, MD;  Location: [facility];  Service: Cardiovascular;  Laterality: N/A;   PERIPHERAL VASCULAR INTERVENTION  08/04/2021   Procedure: PERIPHERAL VASCULAR INTERVENTION;  Surgeon: Serafina Mitchell, MD;  Location: [facility];  Service: Cardiovascular;;    PERIPHERAL VASCULAR THROMBECTOMY  08/06/2021   Procedure: PERIPHERAL VASCULAR THROMBECTOMY;  Surgeon: Cherre Robins, MD;  Location: [facility];  Service: Cardiovascular;;  Left Popliteal Vein    Family History  Problem Relation Age of Onset   Lung cancer Mother    Coronary artery disease Father     Social History   Socioeconomic History   Marital status: Married    Spouse name: Not on file   Number of children: Not on file   Years of education: Not on file   Highest education level: Not on file  Occupational History   Occupation: retired  Tobacco Use   Smoking status: Former    Packs/day: 0.50    Years: 10.00    Pack years: 5.00    Types: Cigarettes   Smokeless tobacco: Never  Vaping Use   Vaping Use: Never used  Substance and Sexual Activity   Alcohol use: Not Currently   Drug use: Never   Sexual activity: Not on file  Other Topics Concern   Not on file  Social History Narrative   Not on file   Social Determinants of Health   Financial Resource Strain: Not on file  Food Insecurity: Not on file  Transportation Needs: Not on file  Physical Activity: Not on file  Stress: Not on file  Social Connections: Not on file  Intimate Partner Violence: Not on file    No Known Allergies  Current Facility-Administered Medications  Medication Dose Route Frequency Provider Last Rate Last Admin   acetaminophen (TYLENOL) tablet 650 mg  650 mg Oral Q6H PRN Karmen Bongo, MD       Or   acetaminophen (TYLENOL) suppository 650 mg  650 mg Rectal Q6H PRN Karmen Bongo, MD       albuterol (PROVENTIL) (2.5 MG/3ML) 0.083% nebulizer solution 2.5 mg  2.5 mg Nebulization Q2H PRN Karmen Bongo, MD   2.5 mg at 08/28/21 9937   amiodarone (PACERONE) tablet 200 mg  200 mg Oral Daily Hosie Poisson, MD   200 mg at 08/28/21 0916   bisacodyl (DULCOLAX) EC tablet 5 mg  5 mg Oral Daily PRN Karmen Bongo, MD   5 mg at 08/27/21 1550   Chlorhexidine Gluconate Cloth 2 % PADS 6 each   6 each Topical Daily Karmen Bongo, MD   6 each at 08/28/21 0917   docusate sodium (COLACE) capsule 100 mg  100 mg Oral BID Hosie Poisson, MD   100 mg at 08/28/21 0916   finasteride (PROSCAR) tablet 5 mg  5 mg Oral Daily Karmen Bongo, MD   5 mg at 08/28/21 0917   heparin ADULT infusion 100 units/mL (25000 units/285m)  1,600 Units/hr Intravenous Continuous LWillette Cluster RPH 16 mL/hr at 08/28/21 1523 1,600 Units/hr at 08/28/21 1523   hydrALAZINE (APRESOLINE) injection 5 mg  5 mg Intravenous Q4H PRN YKarmen Bongo MD       HYDROmorphone (DILAUDID) injection 1 mg  1 mg Intravenous Q1H PRN YKarmen Bongo MD   1 mg at 08/28/21 1526   ipratropium-albuterol (DUONEB) 0.5-2.5 (3) MG/3ML nebulizer solution 3 mL  3 mL Nebulization BID AHosie Poisson MD   3 mL at 08/27/21 2124   melatonin tablet 5 mg  5 mg Oral QHS YKarmen Bongo MD   5 mg at 08/27/21 2253   metoprolol tartrate (LOPRESSOR) injection 2.5 mg  2.5 mg Intravenous Q8H PRN AHosie Poisson MD       phenazopyridine (PYRIDIUM) tablet 100 mg  100 mg Oral TID WC Hosie Poisson, MD   100 mg at 08/28/21 1144   polyethylene glycol (MIRALAX / GLYCOLAX) packet 17 g  17 g Oral Daily PRN Karmen Bongo, MD   17 g at 08/27/21 1605   prochlorperazine (COMPAZINE) injection 10 mg  10 mg Intravenous Q4H PRN Karmen Bongo, MD   10 mg at 08/28/21 1146   rosuvastatin (CRESTOR) tablet 20 mg  20 mg Oral Daily Karmen Bongo, MD   20 mg at 08/28/21 3875   senna-docusate (Senokot-S) tablet 2 tablet  2 tablet Oral BID Hosie Poisson, MD   2 tablet at 08/28/21 0916   sodium chloride flush (NS) 0.9 % injection 3 mL  3 mL Intravenous Lillia Mountain, MD   3 mL at 08/28/21 0917   sodium chloride irrigation 0.9 % 3,000 mL  3,000 mL Irrigation Continuous Alexis Frock, MD   3,000 mL at 08/27/21 0020   traMADol (ULTRAM) tablet 50 mg  50 mg Oral Q6H PRN Hosie Poisson, MD   50 mg at 08/28/21 1526    PHYSICAL EXAM Vitals:   08/28/21 0518 08/28/21 0629  08/28/21 0800 08/28/21 1458  BP: 125/73  134/71 134/70  Pulse: 72  72 67  Resp: '16 16 16 17  '$ Temp: 98.4 F (36.9 C)  98 F (36.7 C) 98 F (36.7 C)  TempSrc: Oral  Oral Oral  SpO2: 97% 94% 94% 93%  Weight:      Height:        Constitutional: well appearing man in no distress Cardiac: regular rate and rhythm.  Respiratory:  unlabored. Abdominal:  soft, non-tender, non-distended.  Peripheral vascular: 2+ L DP. No edema in LLE. R AKA healing appropriately with staples.   PERTINENT LABORATORY AND RADIOLOGIC DATA  Most recent CBC    Latest Ref Rng & Units 08/28/2021    3:00 AM 08/27/2021    3:01 AM 08/26/2021    1:15 AM  CBC  WBC 4.0 - 10.5 K/uL 22.6   23.1   38.7    Hemoglobin 13.0 - 17.0 g/dL 8.4   7.4   7.7    Hematocrit 39.0 - 52.0 % 27.5   23.4   26.1    Platelets 150 - 400 K/uL 260   258   316       Most recent CMP    Latest Ref Rng & Units 08/28/2021    3:00 AM 08/25/2021    5:12 AM 08/24/2021    6:01 AM  CMP  Glucose 70 - 99 mg/dL 121   114   169    BUN 8 - 23 mg/dL '8   18   10    '$ Creatinine 0.61 - 1.24 mg/dL 0.56   0.57   0.51    Sodium 135 - 145 mmol/L 134   131   132    Potassium 3.5 - 5.1 mmol/L 4.0   4.6   4.0    Chloride 98 - 111 mmol/L 96   98   99    CO2 22 - 32 mmol/L '30   26   24    '$ Calcium 8.9 - 10.3 mg/dL 8.7   8.8   8.7      Renal function Estimated Creatinine Clearance: 102.1 mL/min (A) (by C-G formula based on SCr of 0.56 mg/dL (L)).  Hgb A1c MFr Bld (%)  Date Value  08/05/2021 6.1 (H)    LDL Cholesterol  Date Value Ref Range Status  08/06/2021 40 0 - 99  mg/dL Final    Comment:           Total Cholesterol/HDL:CHD Risk Coronary Heart Disease Risk Table                     Men   Women  1/2 Average Risk   3.4   3.3  Average Risk       5.0   4.4  2 X Average Risk   9.6   7.1  3 X Average Risk  23.4   11.0        Use the calculated Patient Ratio above and the CHD Risk Table to determine the patient's CHD Risk.        ATP III  CLASSIFICATION (LDL):  <100     mg/dL   Optimal  100-129  mg/dL   Near or Above                    Optimal  130-159  mg/dL   Borderline  160-189  mg/dL   High  >190     mg/dL   Very High Performed at [facility] [address] [place], [place] [address]      Vascular Imaging: Venous duplex 08/26/2021 RIGHT:  - Findings consistent with age indeterminate deep vein thrombosis  involving the right common femoral vein, SF junction, right femoral vein,  right proximal profunda vein, and external iliac vein.  - Findings appear essentially unchanged compared to previous examination.  - Ultrasound characteristics of enlarged lymph nodes are noted in the  groin.     LEFT:  - There is no evidence of deep vein thrombosis in the lower extremity.     - Ultrasound characteristics of enlarged lymph nodes noted in the groin.      CT angiogram of chest Resolution of pulmonary embolism  Yevonne Aline. Stanford Breed, MD [facility] Phone Number: [phone] 08/28/2021 3:57 PM  Total time spent on preparing this encounter including chart review, data review, collecting history, examining the patient, coordinating care for this established patient, 40 minutes.  Portions of this report may have been transcribed using voice recognition software.  Every effort has been made to ensure accuracy; however, inadvertent computerized transcription errors may still be present.

## 2021-08-28 NOTE — Progress Notes (Signed)
ANTICOAGULATION CONSULT NOTE  Pharmacy Consult for heparin Indication: submassive PE 08/08/21 and BL DVT   No Known Allergies  Patient Measurements: Height: 6' (182.9 cm) Weight: 99.8 kg (220 lb) IBW/kg (Calculated) : 77.6 Heparin Dosing Weight: 98 kg   Vital Signs: Temp: 98.4 F (36.9 C) (05/20 0518) Temp Source: Oral (05/20 0518) BP: 125/73 (05/20 0518) Pulse Rate: 72 (05/20 0518)  Labs: Recent Labs    08/26/21 0115 08/26/21 0953 08/27/21 0301 08/28/21 0300  HGB 7.7*  --  7.4* 8.4*  HCT 26.1*  --  23.4* 27.5*  PLT 316  --  258 260  APTT 52* 29  --   --   HEPARINUNFRC 0.44  --   --  0.16*  CREATININE  --   --   --  0.56*  TROPONINIHS  --  16  --   --     Estimated Creatinine Clearance: 102.1 mL/min (A) (by C-G formula based on SCr of 0.56 mg/dL (L)).   Medical History: Past Medical History:  Diagnosis Date   BPH (benign prostatic hyperplasia)    CLL (chronic lymphocytic leukemia) (HCC)    Hypertension    Pacemaker    Medtronic Device   Pulmonary embolism (HCC)    PVD (peripheral vascular disease) (HCC)    s/p stent, thrombectomy     Assessment: 71 yo M on apixaban for submassive PE 08/08/21 and BL DVTs 08/05/21. Apixaban held for hematuria now improved on continuous bladder irrigation. Apixaban last dose 5/15 ~18:00. Pharmacy consulted for heparin.    Heparin was discontinued at 7:30 due to worsening hematuria. Scheduled aPTT was drawn after and is subtherapeutic as expected. Discussed with Dr. Karleen Hampshire - re-attempt IV heparin to see if he bleeds before initiating po anticoagulation.    Heparin level subtherapeutic 0.16 at heparin 1350 units/hr. No issues with heparin infusion. No signs bleeding reported per discussion with RN.  Goal of Therapy:  Heparin level 0.3- 0.5 units/mL (targeting lower end of therapeutic range d/t recent bleeding) aPTT 66-102 seconds Monitor platelets by anticoagulation protocol: Yes   Plan:  Increase heparin infusion to 1500  units/hr Check heparin level in 8 hours and daily while on heparin Continue to monitor H&H and platelets    Thank you for allowing pharmacy to be a part of this patient's care.  Ardyth Harps, PharmD Clinical Pharmacist

## 2021-08-28 NOTE — Progress Notes (Signed)
[facility]                                                                               Douglas [name], is a 71 y.o. male, DOB - Jan 14, 1951, [id number] Admit date - 08/24/2021    Outpatient Primary MD for the patient is Douglas Pepper, MD  LOS - 3  days    Brief summary    Douglas [name] is a 71 y.o. male with medical history significant of BPH; CLL; and HTN presenting with gross hematuria.  He was discharged from the hospital with a foley, has been there for a while.  His home nurse thought it needed to be changed out to prevent infection.  She tried to change it on 5/15 initially had difficulty replacing it, and meanwhile patient didn't have UOP for hours.  she was able to put in back in around 10 pm on 5/15 and since then , he has gross hematuria and was seen int he ED for further evaluation.   Assessment & Plan    Assessment and Plan:  Gross hematuria probably secondary to traumatic Foley replacement in the setting of anticoagulation Patient has history of urinary retention and indwelling Foley placement. Urology consulted, new large bore catheter placed on traction for tamponade and on gentle irrigation to prevent new clot formation. patient had large clots in the Foley catheter tubing underwent several irrigations and currently on CBI with improvement in hematuria. He was started on IV heparin but had to be discontinued  on the morning of 5/18 as he had recurrent hematuria.  His urine is clear, restarted IV heparin on 5/19, so far his urine is clear. Recommend to continue the IV heparin for now. If for any reason he has recurrent hematuria, will stop the IV heparin .[name]  Continue finasteride.  Outpatient voiding trial in the outpatient setting.   History of CLL Follows up with Dr. Alen [name]. CT angio of the chest showed adenopathy, recommend outpatient follow up with Dr Douglas [name] on discharge.     Essential hypertension Blood pressure parameters are optimal.     Diet-controlled type 2 diabetes mellitus.   History of recent PE/DVT. Patient's aspirin Plavix and Eliquis on hold given gross hematuria. Pt had a submassive PE less than a month ago, with right heart strain. PCCM consulted as we are unable to anticoagulate due to hematuria , to see if catheter thrombectomy is warranted A repeat CTA and venous duplex ordered. Repeat CTA is negative for PE, venous duplex showed  persistent DVT on the Right lower extremity. Vascular re consulted for IVC filter due to intermittent hematuria on anti coagulation. Scheduled for Monday. Will make him NPO after midnight.   Echocardiogram repeated, RV systolic function is normal.     Right Limb ischemia S/p right AKA Recently with angiogram and thrombectomy. Aspirin and Plavix are currently on hold due to gross hematuria.   Continue with Crestor.    Recent wide complex tachycardia secondary to SVT. Continue with amiodarone 200 mg daily.  Patient having episodic SVT with paroxysms of pain As needed metoprolol  Hyponatremia:  Improved to 134, and stable.   Acute Anemia of blood loss from hematuria: Hemoglobin  dropped from 10.8 to 7.4, 1 prbc transfusion ordered. Repeat hemoglobin around 8.4 . Continue to monitor.     RN Pressure Injury Documentation: Pressure Injury 08/05/21 Heel Left Stage 1 -  Intact skin with non-blanchable redness of a localized area usually over a bony prominence. redden and blances Foam was on it already. (Active)  08/05/21 1930  Location: Heel  Location Orientation: Left  Staging: Stage 1 -  Intact skin with non-blanchable redness of a localized area usually over a bony prominence.  Wound Description (Comments): redden and blances Foam was on it already.  Present on Admission:   Dressing Type Foam - Lift dressing to assess site every shift 08/28/21 0800     Pressure Injury 08/05/21 Buttocks Right;Left;Mid Stage 1 -  Intact skin with non-blanchable redness of a localized  area usually over a bony prominence. blanches redden (Active)  08/05/21 1930  Location: Buttocks  Location Orientation: Right;Left;Mid  Staging: Stage 1 -  Intact skin with non-blanchable redness of a localized area usually over a bony prominence.  Wound Description (Comments): blanches redden  Present on Admission:   Dressing Type Foam - Lift dressing to assess site every shift 08/28/21 0800   Local wound care.      Estimated body mass index is 29.84 kg/m as calculated from the following:   Height as of this encounter: 6' (1.829 m).   Weight as of this encounter: 99.8 kg.  Code Status: full code.  DVT Prophylaxis:  SCDs Start: 08/24/21 1207   Level of Care: Level of care: Med-Surg Family Communication: none at bedside  Disposition Plan:     Remains inpatient appropriate:  prbc transfusion.   Procedures:  CBI.  CTA of the chest Venous duplex of the lower extremities.   Consultants:   Urology.  PCCM IR.   Antimicrobials:   Anti-infectives (From admission, onward)    Start     Dose/Rate Route Frequency Ordered Stop   08/25/21 1134  cefTRIAXone (ROCEPHIN) 1 g in sodium chloride 0.9 % 100 mL IVPB        1 g 200 mL/hr over 30 Minutes Intravenous Every 24 hours 08/25/21 1131 08/26/21 1942   08/24/21 0745  cefTRIAXone (ROCEPHIN) 1 g in sodium chloride 0.9 % 100 mL IVPB        1 g 200 mL/hr over 30 Minutes Intravenous  Once 08/24/21 0730 08/24/21 6967        Medications  Scheduled Meds:  amiodarone  200 mg Oral Daily   Chlorhexidine Gluconate Cloth  6 each Topical Daily   docusate sodium  100 mg Oral BID   finasteride  5 mg Oral Daily   ipratropium-albuterol  3 mL Nebulization BID   melatonin  5 mg Oral QHS   phenazopyridine  100 mg Oral TID WC   rosuvastatin  20 mg Oral Daily   senna-docusate  2 tablet Oral BID   sodium chloride flush  3 mL Intravenous Q12H   Continuous Infusions:  heparin 1,500 Units/hr (08/28/21 1316)   sodium chloride irrigation      PRN Meds:.acetaminophen **OR** acetaminophen, albuterol, bisacodyl, hydrALAZINE, HYDROmorphone (DILAUDID) injection, metoprolol tartrate, ondansetron **OR** ondansetron (ZOFRAN) IV, polyethylene glycol, prochlorperazine, traMADol    Subjective:   Douglas [name] Kassebaum was seen and examined today.   Patient reports nausea, no vomiting, associated with bladder spasms.  Zofran is not helping, he has compazine.  Objective:   Vitals:   08/27/21 2338 08/28/21 0518 08/28/21 0629 08/28/21 0800  BP: 131/67 125/73  134/71  Pulse: 81 72  72  Resp: '18 16 16 16  '$ Temp: 97.9 F (36.6 C) 98.4 F (36.9 C)  98 F (36.7 C)  TempSrc: Oral Oral  Oral  SpO2: 92% 97% 94% 94%  Weight:      Height:        Intake/Output Summary (Last 24 hours) at 08/28/2021 1458 Last data filed at 08/28/2021 1250 Gross per 24 hour  Intake 717 ml  Output 4265 ml  Net -3548 ml    Filed Weights   08/24/21 0527  Weight: 99.8 kg     Exam General exam: elderly gentleman, not in distress.  Respiratory system: Clear to auscultation. Respiratory effort normal. Cardiovascular system: S1 & S2 heard, RRR. No JVD,  No pedal edema. Gastrointestinal system: Abdomen is nondistended, soft and nontender.  Normal bowel sounds heard. Central nervous system: Alert and oriented. No focal neurological deficits. Extremities: right AKA with sutures.  Skin: No rashes, lesions or ulcers Psychiatry: Mood & affect appropriate.       Data Reviewed:  I have personally reviewed following labs and imaging studies   CBC Lab Results  Component Value Date   WBC 22.6 (H) 08/28/2021   RBC 3.07 (L) 08/28/2021   HGB 8.4 (L) 08/28/2021   HCT 27.5 (L) 08/28/2021   MCV 89.6 08/28/2021   MCH 27.4 08/28/2021   PLT 260 08/28/2021   MCHC 30.5 08/28/2021   RDW 18.6 (H) 08/28/2021   LYMPHSABS 9.6 (H) 08/24/2021   MONOABS 9.2 (H) 08/24/2021   EOSABS 0.2 08/24/2021   BASOSABS 0.1 00/17/4944     Last metabolic panel Lab Results   Component Value Date   NA 134 (L) 08/28/2021   K 4.0 08/28/2021   CL 96 (L) 08/28/2021   CO2 30 08/28/2021   BUN 8 08/28/2021   CREATININE 0.56 (L) 08/28/2021   GLUCOSE 121 (H) 08/28/2021   GFRNONAA >60 08/28/2021   GFRAA >60 01/08/2020   CALCIUM 8.7 (L) 08/28/2021   PROT 5.1 (L) 07/14/2021   ALBUMIN 2.2 (L) 07/14/2021   BILITOT 0.4 07/14/2021   ALKPHOS 214 (H) 07/14/2021   AST 28 07/14/2021   ALT 89 (H) 07/14/2021   ANIONGAP 8 08/28/2021    CBG (last 3)  No results for input(s): GLUCAP in the last 72 hours.    Coagulation Profile: No results for input(s): INR, PROTIME in the last 168 hours.   Radiology Studies: No results found.     Hosie Poisson M.D. [facility] 08/28/2021, 2:58 PM  Available via Epic secure chat 7am-7pm After 7 pm, please refer to night coverage provider listed on amion.

## 2021-08-28 NOTE — H&P (View-Only) (Signed)
[facility] [place]  ASSESSMENT / PLAN: 71 y.o. male with recent DVT/PE and frank hematuria requiring continuous bladder irrigation. Given the inability to anticoagulate safely in this setting, an IVC filter seems appropriate. Will plan to place filter on Monday.  CHIEF COMPLAINT: hematuria  HISTORY OF PRESENT ILLNESS: Douglas [name] is a 71 y.o. male admitted to the internal medicine service for gross hematuria.  He is had multiple issues with urinary retention and has had a chronic indwelling Foley.  Urology service has initiated continuous bladder irrigation which did clear up his hematuria.  He has been anticoagulated for DVT and pulmonary embolism.  He has a complex past vascular history (see below).  Given the risk for resuming anticoagulation, vascular consultation was requested for placement of an IVC filter.  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 Negative history of coronary artery disease.  Negative history of diabetes mellitus.  Positive history of smoking.  Positive history of hypertension.  Negative history of chronic kidney disease.   Negative history of chronic obstructive pulmonary disease  FUNCTIONAL STATUS: ECOG performance status: (3) Capable of limited self-care, confined to bed or chair > 50% of waking hours Ambulatory status: Ambulatory with assistance only  Past Medical History:  Diagnosis Date   BPH (benign prostatic hyperplasia)    CLL (chronic lymphocytic leukemia) (HCC)    Hypertension    Pacemaker    Medtronic Device   Pulmonary  embolism (HCC)    PVD (peripheral vascular disease) ([place])    s/p stent, thrombectomy    Past Surgical History:  Procedure Laterality Date   ABDOMINAL AORTOGRAM W/LOWER EXTREMITY Bilateral 06/21/2021   Procedure: ABDOMINAL AORTOGRAM W/LOWER EXTREMITY;  Surgeon: Waynetta Sandy, MD;  Location: [facility];  Service: Cardiovascular;  Laterality: Bilateral;   ABDOMINAL AORTOGRAM W/LOWER EXTREMITY N/A 08/04/2021   Procedure: ABDOMINAL AORTOGRAM W/LOWER EXTREMITY;  Surgeon: Serafina Mitchell, MD;  Location: [facility];  Service: Cardiovascular;  Laterality: N/A;   AMPUTATION Right 06/25/2021   Procedure: RIGHT BELOW KNEE AMPUTATION;  Surgeon: Waynetta Sandy, MD;  Location: [facility];  Service: Vascular;  Laterality: Right;   AMPUTATION Right 07/19/2021   Procedure: AMPUTATION ABOVE KNEE;  Surgeon: Angelia Mould, MD;  Location: [facility];  Service: Vascular;  Laterality: Right;   FEMORAL-POPLITEAL BYPASS GRAFT Right 06/22/2021   Procedure: RIGHT FEMORAL-POPLITEAL BYPASS WITH VEIN, RIGHT POPLITEAL THROMBECTOMY;  Surgeon: Waynetta Sandy, MD;  Location: [facility];  Service: Vascular;  Laterality: Right;   LEFT HEART CATH AND CORONARY ANGIOGRAPHY N/A 01/18/2021   Procedure: LEFT HEART CATH AND CORONARY ANGIOGRAPHY;  Surgeon: Burnell Blanks, MD;  Location: [facility];  Service: Cardiovascular;  Laterality: N/A;   LOWER EXTREMITY VENOGRAPHY Left 08/06/2021   Procedure: LOWER EXTREMITY VENOGRAPHY;  Surgeon: Cherre Robins, MD;  Location: [facility];  Service: Cardiovascular;  Laterality: Left;   PACEMAKER IMPLANT N/A 01/19/2021   Procedure: PACEMAKER IMPLANT;  Surgeon: Evans Lance, MD;  Location: [facility];  Service: Cardiovascular;  Laterality: N/A;   PERIPHERAL VASCULAR INTERVENTION  08/04/2021   Procedure: PERIPHERAL VASCULAR INTERVENTION;  Surgeon: Serafina Mitchell, MD;  Location: [facility];  Service: Cardiovascular;;    PERIPHERAL VASCULAR THROMBECTOMY  08/06/2021   Procedure: PERIPHERAL VASCULAR THROMBECTOMY;  Surgeon: Cherre Robins, MD;  Location: [facility];  Service: Cardiovascular;;  Left Popliteal Vein    Family History  Problem Relation Age of Onset   Lung cancer Mother    Coronary artery disease Father     Social History   Socioeconomic History   Marital status: Married    Spouse name: Not on file   Number of children: Not on file   Years of education: Not on file   Highest education level: Not on file  Occupational History   Occupation: retired  Tobacco Use   Smoking status: Former    Packs/day: 0.50    Years: 10.00    Pack years: 5.00    Types: Cigarettes   Smokeless tobacco: Never  Vaping Use   Vaping Use: Never used  Substance and Sexual Activity   Alcohol use: Not Currently   Drug use: Never   Sexual activity: Not on file  Other Topics Concern   Not on file  Social History Narrative   Not on file   Social Determinants of Health   Financial Resource Strain: Not on file  Food Insecurity: Not on file  Transportation Needs: Not on file  Physical Activity: Not on file  Stress: Not on file  Social Connections: Not on file  Intimate Partner Violence: Not on file    No Known Allergies  Current Facility-Administered Medications  Medication Dose Route Frequency Provider Last Rate Last Admin   acetaminophen (TYLENOL) tablet 650 mg  650 mg Oral Q6H PRN Karmen Bongo, MD       Or   acetaminophen (TYLENOL) suppository 650 mg  650 mg Rectal Q6H PRN Karmen Bongo, MD       albuterol (PROVENTIL) (2.5 MG/3ML) 0.083% nebulizer solution 2.5 mg  2.5 mg Nebulization Q2H PRN Karmen Bongo, MD   2.5 mg at 08/28/21 0938   amiodarone (PACERONE) tablet 200 mg  200 mg Oral Daily Hosie Poisson, MD   200 mg at 08/28/21 0916   bisacodyl (DULCOLAX) EC tablet 5 mg  5 mg Oral Daily PRN Karmen Bongo, MD   5 mg at 08/27/21 1550   Chlorhexidine Gluconate Cloth 2 % PADS 6 each   6 each Topical Daily Karmen Bongo, MD   6 each at 08/28/21 0917   docusate sodium (COLACE) capsule 100 mg  100 mg Oral BID Hosie Poisson, MD   100 mg at 08/28/21 0916   finasteride (PROSCAR) tablet 5 mg  5 mg Oral Daily Karmen Bongo, MD   5 mg at 08/28/21 0917   heparin ADULT infusion 100 units/mL (25000 units/256m)  1,600 Units/hr Intravenous Continuous LWillette Cluster RPH 16 mL/hr at 08/28/21 1523 1,600 Units/hr at 08/28/21 1523   hydrALAZINE (APRESOLINE) injection 5 mg  5 mg Intravenous Q4H PRN YKarmen Bongo MD       HYDROmorphone (DILAUDID) injection 1 mg  1 mg Intravenous Q1H PRN YKarmen Bongo MD   1 mg at 08/28/21 1526   ipratropium-albuterol (DUONEB) 0.5-2.5 (3) MG/3ML nebulizer solution 3 mL  3 mL Nebulization BID AHosie Poisson MD   3 mL at 08/27/21 2124   melatonin tablet 5 mg  5 mg Oral QHS YKarmen Bongo MD   5 mg at 08/27/21 2253   metoprolol tartrate (LOPRESSOR) injection 2.5 mg  2.5 mg Intravenous Q8H PRN AHosie Poisson MD       phenazopyridine (PYRIDIUM) tablet 100 mg  100 mg Oral TID WC Hosie Poisson, MD   100 mg at 08/28/21 1144   polyethylene glycol (MIRALAX / GLYCOLAX) packet 17 g  17 g Oral Daily PRN Karmen Bongo, MD   17 g at 08/27/21 1605   prochlorperazine (COMPAZINE) injection 10 mg  10 mg Intravenous Q4H PRN Karmen Bongo, MD   10 mg at 08/28/21 1146   rosuvastatin (CRESTOR) tablet 20 mg  20 mg Oral Daily Karmen Bongo, MD   20 mg at 08/28/21 9735   senna-docusate (Senokot-S) tablet 2 tablet  2 tablet Oral BID Hosie Poisson, MD   2 tablet at 08/28/21 0916   sodium chloride flush (NS) 0.9 % injection 3 mL  3 mL Intravenous Lillia Mountain, MD   3 mL at 08/28/21 0917   sodium chloride irrigation 0.9 % 3,000 mL  3,000 mL Irrigation Continuous Alexis Frock, MD   3,000 mL at 08/27/21 0020   traMADol (ULTRAM) tablet 50 mg  50 mg Oral Q6H PRN Hosie Poisson, MD   50 mg at 08/28/21 1526    PHYSICAL EXAM Vitals:   08/28/21 0518 08/28/21 0629  08/28/21 0800 08/28/21 1458  BP: 125/73  134/71 134/70  Pulse: 72  72 67  Resp: '16 16 16 17  '$ Temp: 98.4 F (36.9 C)  98 F (36.7 C) 98 F (36.7 C)  TempSrc: Oral  Oral Oral  SpO2: 97% 94% 94% 93%  Weight:      Height:        Constitutional: well appearing man in no distress Cardiac: regular rate and rhythm.  Respiratory:  unlabored. Abdominal:  soft, non-tender, non-distended.  Peripheral vascular: 2+ L DP. No edema in LLE. R AKA healing appropriately with staples.   PERTINENT LABORATORY AND RADIOLOGIC DATA  Most recent CBC    Latest Ref Rng & Units 08/28/2021    3:00 AM 08/27/2021    3:01 AM 08/26/2021    1:15 AM  CBC  WBC 4.0 - 10.5 K/uL 22.6   23.1   38.7    Hemoglobin 13.0 - 17.0 g/dL 8.4   7.4   7.7    Hematocrit 39.0 - 52.0 % 27.5   23.4   26.1    Platelets 150 - 400 K/uL 260   258   316       Most recent CMP    Latest Ref Rng & Units 08/28/2021    3:00 AM 08/25/2021    5:12 AM 08/24/2021    6:01 AM  CMP  Glucose 70 - 99 mg/dL 121   114   169    BUN 8 - 23 mg/dL '8   18   10    '$ Creatinine 0.61 - 1.24 mg/dL 0.56   0.57   0.51    Sodium 135 - 145 mmol/L 134   131   132    Potassium 3.5 - 5.1 mmol/L 4.0   4.6   4.0    Chloride 98 - 111 mmol/L 96   98   99    CO2 22 - 32 mmol/L '30   26   24    '$ Calcium 8.9 - 10.3 mg/dL 8.7   8.8   8.7      Renal function Estimated Creatinine Clearance: 102.1 mL/min (A) (by C-G formula based on SCr of 0.56 mg/dL (L)).  Hgb A1c MFr Bld (%)  Date Value  08/05/2021 6.1 (H)    LDL Cholesterol  Date Value Ref Range Status  08/06/2021 40 0 - 99  mg/dL Final    Comment:           Total Cholesterol/HDL:CHD Risk Coronary Heart Disease Risk Table                     Men   Women  1/2 Average Risk   3.4   3.3  Average Risk       5.0   4.4  2 X Average Risk   9.6   7.1  3 X Average Risk  23.4   11.0        Use the calculated Patient Ratio above and the CHD Risk Table to determine the patient's CHD Risk.        ATP III  CLASSIFICATION (LDL):  <100     mg/dL   Optimal  100-129  mg/dL   Near or Above                    Optimal  130-159  mg/dL   Borderline  160-189  mg/dL   High  >190     mg/dL   Very High Performed at [facility] [address] [place], [place] [address]      Vascular Imaging: Venous duplex 08/26/2021 RIGHT:  - Findings consistent with age indeterminate deep vein thrombosis  involving the right common femoral vein, SF junction, right femoral vein,  right proximal profunda vein, and external iliac vein.  - Findings appear essentially unchanged compared to previous examination.  - Ultrasound characteristics of enlarged lymph nodes are noted in the  groin.     LEFT:  - There is no evidence of deep vein thrombosis in the lower extremity.     - Ultrasound characteristics of enlarged lymph nodes noted in the groin.      CT angiogram of chest Resolution of pulmonary embolism  Yevonne Aline. Stanford Breed, MD [facility] Phone Number: [phone] 08/28/2021 3:57 PM  Total time spent on preparing this encounter including chart review, data review, collecting history, examining the patient, coordinating care for this established patient, 40 minutes.  Portions of this report may have been transcribed using voice recognition software.  Every effort has been made to ensure accuracy; however, inadvertent computerized transcription errors may still be present.

## 2021-08-29 LAB — GLUCOSE, CAPILLARY: Glucose-Capillary: 133 mg/dL — ABNORMAL HIGH (ref 70–99)

## 2021-08-29 LAB — CBC
HCT: 29.4 % — ABNORMAL LOW (ref 39.0–52.0)
Hemoglobin: 9 g/dL — ABNORMAL LOW (ref 13.0–17.0)
MCH: 27.4 pg (ref 26.0–34.0)
MCHC: 30.6 g/dL (ref 30.0–36.0)
MCV: 89.6 fL (ref 80.0–100.0)
Platelets: 247 10*3/uL (ref 150–400)
RBC: 3.28 MIL/uL — ABNORMAL LOW (ref 4.22–5.81)
RDW: 18.7 % — ABNORMAL HIGH (ref 11.5–15.5)
WBC: 21.4 10*3/uL — ABNORMAL HIGH (ref 4.0–10.5)
nRBC: 0 % (ref 0.0–0.2)

## 2021-08-29 LAB — HEPARIN LEVEL (UNFRACTIONATED)
Heparin Unfractionated: 0.22 IU/mL — ABNORMAL LOW (ref 0.30–0.70)
Heparin Unfractionated: 0.33 IU/mL (ref 0.30–0.70)

## 2021-08-29 NOTE — Progress Notes (Signed)
ANTICOAGULATION CONSULT NOTE  Pharmacy Consult for heparin Indication: submassive PE 08/08/21 and BL DVT   No Known Allergies  Patient Measurements: Height: 6' (182.9 cm) Weight: 99.8 kg (220 lb) IBW/kg (Calculated) : 77.6 Heparin Dosing Weight: 98 kg   Vital Signs: Temp: 97.9 F (36.6 C) (05/21 0840) Temp Source: Oral (05/21 0840) BP: 140/65 (05/21 0840) Pulse Rate: 83 (05/21 0840)  Labs: Recent Labs    08/27/21 0301 08/27/21 0301 08/28/21 0300 08/28/21 1422 08/29/21 0104 08/29/21 1027  HGB 7.4*  --  8.4*  --  9.0*  --   HCT 23.4*  --  27.5*  --  29.4*  --   PLT 258  --  260  --  247  --   HEPARINUNFRC  --    < > 0.16* 0.14* 0.22* 0.33  CREATININE  --   --  0.56*  --   --   --    < > = values in this interval not displayed.     Estimated Creatinine Clearance: 102.1 mL/min (A) (by C-G formula based on SCr of 0.56 mg/dL (L)).   Medical History: Past Medical History:  Diagnosis Date   BPH (benign prostatic hyperplasia)    CLL (chronic lymphocytic leukemia) (HCC)    Hypertension    Pacemaker    Medtronic Device   Pulmonary embolism (HCC)    PVD (peripheral vascular disease) (HCC)    s/p stent, thrombectomy     Assessment: 71 yo M on apixaban for submassive PE 08/08/21 and BL DVTs 08/05/21. Apixaban held for hematuria now improved on continuous bladder irrigation. Apixaban last dose 5/15 ~18:00. Pharmacy consulted for heparin. Of note, planning to place IVC filter on Monday.  5/21: Heparin level subtherapeutic 0.22 at heparin 1600 units/hr. No additional issues with bleeding in catheter or bleeding otherwise per discussion with RN. Will increase heparin rate slightly and monitor closely for bleeding.  5/21 afternoon: Heparin level therapeutic at 0.33.No additional issues with bleeding in catheter or bleeding otherwise per discussion with RN.   Goal of Therapy:  Heparin level 0.3- 0.5 units/mL (targeting lower end of therapeutic range d/t recent bleeding) aPTT  66-102 seconds Monitor platelets by anticoagulation protocol: Yes   Plan:  Continue Heparin infusion at 1700 units/hr Heparin level daily Follow-up IVC filter plans in am Continue to monitor H&H and platelets    Lestine Box, PharmD PGY2 Infectious Diseases Pharmacy Resident   Please check [email or web address] for unit-specific pharmacy phone numbers

## 2021-08-29 NOTE — Progress Notes (Signed)
ANTICOAGULATION CONSULT NOTE  Pharmacy Consult for heparin Indication: submassive PE 08/08/21 and BL DVT   No Known Allergies  Patient Measurements: Height: 6' (182.9 cm) Weight: 99.8 kg (220 lb) IBW/kg (Calculated) : 77.6 Heparin Dosing Weight: 98 kg   Vital Signs: Temp: 98.5 F (36.9 C) (05/20 1944) Temp Source: Oral (05/20 1944) BP: 137/72 (05/20 1944) Pulse Rate: 78 (05/20 1944)  Labs: Recent Labs    08/26/21 0953 08/27/21 0301 08/27/21 0301 08/28/21 0300 08/28/21 1422 08/29/21 0104  HGB  --  7.4*   < > 8.4*  --  9.0*  HCT  --  23.4*  --  27.5*  --  29.4*  PLT  --  258  --  260  --  247  APTT 29  --   --   --   --   --   HEPARINUNFRC  --   --   --  0.16* 0.14* 0.22*  CREATININE  --   --   --  0.56*  --   --   TROPONINIHS 16  --   --   --   --   --    < > = values in this interval not displayed.    Estimated Creatinine Clearance: 102.1 mL/min (A) (by C-G formula based on SCr of 0.56 mg/dL (L)).   Medical History: Past Medical History:  Diagnosis Date   BPH (benign prostatic hyperplasia)    CLL (chronic lymphocytic leukemia) (HCC)    Hypertension    Pacemaker    Medtronic Device   Pulmonary embolism (HCC)    PVD (peripheral vascular disease) (HCC)    s/p stent, thrombectomy     Assessment: 71 yo M on apixaban for submassive PE 08/08/21 and BL DVTs 08/05/21. Apixaban held for hematuria now improved on continuous bladder irrigation. Apixaban last dose 5/15 ~18:00. Pharmacy consulted for heparin.    5/19: Heparin was discontinued at 7:30 due to worsening hematuria. Scheduled aPTT was drawn after and is subtherapeutic as expected. Discussed with Dr. Karleen Hampshire - re-attempt IV heparin to see if he bleeds before initiating po anticoagulation.  Re-started around 6pm.   5/20: Heparin level subtherapeutic 0.14 at heparin 1500 units/hr. No issues with heparin infusion. Per RN, a small amount of blood in catheter. Discussed with Dr. Karleen Hampshire we will increase by around  1unit/kg/h and monitor closely- planning to place IVC filter on 5/22.   5/21: Heparin level subtherapeutic 0.22 at heparin 1600 units/hr. No additional issues with bleeding in catheter or bleeding otherwise per discussion with RN. Will increase heparin rate slightly and monitor closely for bleeding.   Goal of Therapy:  Heparin level 0.3- 0.5 units/mL (targeting lower end of therapeutic range d/t recent bleeding) aPTT 66-102 seconds Monitor platelets by anticoagulation protocol: Yes   Plan:  Increase heparin infusion to 1700 units/hr Check heparin level in 8 hours and daily while on heparin Continue to monitor H&H and platelets    Thank you for allowing pharmacy to be a part of this patient's care.  Ardyth Harps, PharmD Clinical Pharmacist

## 2021-08-29 NOTE — Progress Notes (Signed)
[facility] [place] PROGRESS NOTE  ASSESSMENT / PLAN: Douglas [name] is a 71 y.o. male with hematuria and DVT. Plan IVCF placement tomorrow in cath lab. NPO after midnight.   SUBJECTIVE: No complaints. Urine clearing.  OBJECTIVE: BP 140/65 (BP Location: Right Arm)   Pulse 83   Temp 97.9 F (36.6 C) (Oral)   Resp 17   Ht 6' (1.829 m)   Wt 99.8 kg   SpO2 96%   BMI 29.84 kg/m   Intake/Output Summary (Last 24 hours) at 08/29/2021 0953 Last data filed at 08/29/2021 0855 Gross per 24 hour  Intake 990.23 ml  Output 3190 ml  Net -2199.77 ml    No distress RRR Unlabored Soft abdomen 2+ L DP R AKA     Latest Ref Rng & Units 08/29/2021    1:04 AM 08/28/2021    3:00 AM 08/27/2021    3:01 AM  CBC  WBC 4.0 - 10.5 K/uL 21.4   22.6   23.1    Hemoglobin 13.0 - 17.0 g/dL 9.0   8.4   7.4    Hematocrit 39.0 - 52.0 % 29.4   27.5   23.4    Platelets 150 - 400 K/uL 247   260   258          Latest Ref Rng & Units 08/28/2021    3:00 AM 08/25/2021    5:12 AM 08/24/2021    6:01 AM  CMP  Glucose 70 - 99 mg/dL 121   114   169    BUN 8 - 23 mg/dL '8   18   10    '$ Creatinine 0.61 - 1.24 mg/dL 0.56   0.57   0.51    Sodium 135 - 145 mmol/L 134   131   132    Potassium 3.5 - 5.1 mmol/L 4.0   4.6   4.0    Chloride 98 - 111 mmol/L 96   98   99    CO2 22 - 32 mmol/L '30   26   24    '$ Calcium 8.9 - 10.3 mg/dL 8.7   8.8   8.7      Estimated Creatinine Clearance: 102.1 mL/min (A) (by C-G formula based on SCr of 0.56 mg/dL (L)).  Douglas [name]. Stanford Breed, MD [facility] Phone Number: [phone] 08/29/2021 9:53 AM

## 2021-08-29 NOTE — Progress Notes (Signed)
[facility]                                                                               Douglas [name], is a 71 y.o. male, DOB - [birth year], URK:[id number] Admit date - 08/24/2021    Outpatient Primary MD for the patient is Douglas Pepper, MD  LOS - 4  days    Brief summary    Douglas [name] is a 71 y.o. male with medical history significant of BPH; CLL; and HTN presenting with gross hematuria.  He was discharged from the hospital with a foley, has been there for a while.  His home nurse thought it needed to be changed out to prevent infection.  She tried to change it on 5/15 initially had difficulty replacing it, and meanwhile patient didn't have UOP for hours.  she was able to put in back in around 10 pm on 5/15 and since then , he has gross hematuria and was seen int he ED for further evaluation.   Assessment & Plan    Assessment and Plan:  Gross hematuria probably secondary to traumatic Foley replacement in the setting of anticoagulation Patient has history of urinary retention and indwelling Foley placement. Urology consulted, new large bore catheter placed on traction for tamponade and on gentle irrigation to prevent new clot formation. patient had large clots in the Foley catheter tubing underwent several irrigations and currently on CBI with improvement in hematuria. He was started on IV heparin but had to be discontinued  on the morning of 5/18 as he had recurrent hematuria.  His urine is [name], restarted IV heparin on 5/19, so far his urine is [name]. Recommend to continue the IV heparin for now.  No hematuria so far.  Continue finasteride.  Outpatient voiding trial in the outpatient setting.   History of CLL Follows up with Dr. Alen [name]. CT angio of the chest showed adenopathy, recommend outpatient follow up with Dr Douglas [name] on discharge.     Essential hypertension Blood pressure parameters are well controlled.    Diet-controlled type 2 diabetes  mellitus.   History of recent PE/DVT. Patient's aspirin Plavix and Eliquis on hold given gross hematuria. Pt had a submassive PE less than a month ago, with right heart strain. PCCM consulted as we are unable to anticoagulate due to hematuria , to see if catheter thrombectomy is warranted A repeat CTA and venous duplex ordered. Repeat CTA is negative for PE, venous duplex showed  persistent DVT on the Right lower extremity. Vascular re consulted for IVC filter due to intermittent hematuria on anti coagulation. Scheduled for Monday. Will make him NPO after midnight.   Echocardiogram repeated, RV systolic function is normal.     Right Limb ischemia S/p right AKA Recently with angiogram and thrombectomy. Aspirin and Plavix are currently on hold due to gross hematuria.   Continue with Crestor.    Recent wide complex tachycardia secondary to SVT. Continue with amiodarone 200 mg daily.  Patient having episodic SVT with paroxysms of pain As needed metoprolol  Hyponatremia:  Improved to 134, and stable.   Acute Anemia of blood loss from hematuria: Hemoglobin dropped from 10.8 to 7.4, 1 prbc transfusion  ordered. Repeat hemoglobin around 8.4 improved to 9.  Continue to monitor.     RN Pressure Injury Documentation: Pressure Injury 08/05/21 Heel Left Stage 1 -  Intact skin with non-blanchable redness of a localized area usually over a bony prominence. redden and blances Foam was on it already. (Active)  08/05/21 1930  Location: Heel  Location Orientation: Left  Staging: Stage 1 -  Intact skin with non-blanchable redness of a localized area usually over a bony prominence.  Wound Description (Comments): redden and blances Foam was on it already.  Present on Admission:   Dressing Type Foam - Lift dressing to assess site every shift 08/29/21 0800     Pressure Injury 08/05/21 Buttocks Right;Left;Mid Stage 1 -  Intact skin with non-blanchable redness of a localized area usually over a bony  prominence. blanches redden (Active)  08/05/21 1930  Location: Buttocks  Location Orientation: Right;Left;Mid  Staging: Stage 1 -  Intact skin with non-blanchable redness of a localized area usually over a bony prominence.  Wound Description (Comments): blanches redden  Present on Admission:   Dressing Type Foam - Lift dressing to assess site every shift 08/29/21 0800   Local wound care.      Estimated body mass index is 29.84 kg/m as calculated from the following:   Height as of this encounter: 6' (1.829 m).   Weight as of this encounter: 99.8 kg.  Code Status: full code.  DVT Prophylaxis:  SCDs Start: 08/24/21 1207   Level of Care: Level of care: Med-Surg Family Communication: none at bedside  Disposition Plan:     Remains inpatient appropriate:   IVC filter in am.   Procedures:  CBI.  CTA of the chest Venous duplex of the lower extremities.   Consultants:   Urology.  PCCM IR.   Antimicrobials:   Anti-infectives (From admission, onward)    Start     Dose/Rate Route Frequency Ordered Stop   08/25/21 1134  cefTRIAXone (ROCEPHIN) 1 g in sodium chloride 0.9 % 100 mL IVPB        1 g 200 mL/hr over 30 Minutes Intravenous Every 24 hours 08/25/21 1131 08/26/21 1942   08/24/21 0745  cefTRIAXone (ROCEPHIN) 1 g in sodium chloride 0.9 % 100 mL IVPB        1 g 200 mL/hr over 30 Minutes Intravenous  Once 08/24/21 0730 08/24/21 8756        Medications  Scheduled Meds:  amiodarone  200 mg Oral Daily   Chlorhexidine Gluconate Cloth  6 each Topical Daily   docusate sodium  100 mg Oral BID   finasteride  5 mg Oral Daily   ipratropium-albuterol  3 mL Nebulization BID   melatonin  5 mg Oral QHS   phenazopyridine  100 mg Oral TID WC   rosuvastatin  20 mg Oral Daily   senna-docusate  2 tablet Oral BID   sodium chloride flush  3 mL Intravenous Q12H   Continuous Infusions:  heparin 1,700 Units/hr (08/29/21 4332)   sodium chloride irrigation     PRN Meds:.acetaminophen  **OR** acetaminophen, albuterol, bisacodyl, hydrALAZINE, HYDROmorphone (DILAUDID) injection, metoprolol tartrate, polyethylene glycol, prochlorperazine, traMADol    Subjective:   Douglas [name] was seen and examined today.   No new complaints today.  Objective:   Vitals:   08/29/21 0440 08/29/21 0810 08/29/21 0840 08/29/21 1627  BP: (!) 149/79  140/65 (!) 150/75  Pulse: 75 76 83 74  Resp: '16 18 17 17  '$ Temp: (!) 97.5 F (36.4 C)  97.9 F (36.6 C) (!) 97.5 F (36.4 C)  TempSrc: Oral  Oral Oral  SpO2: 97% 97% 96% 97%  Weight:      Height:        Intake/Output Summary (Last 24 hours) at 08/29/2021 1754 Last data filed at 08/29/2021 1700 Gross per 24 hour  Intake 1130.23 ml  Output 4050 ml  Net -2919.77 ml    Filed Weights   08/24/21 0527  Weight: 99.8 kg     Exam General exam: Appears calm and comfortable  Respiratory system: [name] to auscultation. Respiratory effort normal. Cardiovascular system: S1 & S2 heard, RRR. No JVD, murmurs, rubs, gallops or clicks. No pedal edema. Gastrointestinal system: Abdomen is nondistended, soft and nontender. Normal bowel sounds heard. Central nervous system: Alert and oriented. No focal neurological deficits. Extremities: right AKA.  Skin: No rashes, lesions or ulcers Psychiatry: Mood & affect appropriate.        Data Reviewed:  I have personally reviewed following labs and imaging studies   CBC Lab Results  Component Value Date   WBC 21.4 (H) 08/29/2021   RBC 3.28 (L) 08/29/2021   HGB 9.0 (L) 08/29/2021   HCT 29.4 (L) 08/29/2021   MCV 89.6 08/29/2021   MCH 27.4 08/29/2021   PLT 247 08/29/2021   MCHC 30.6 08/29/2021   RDW 18.7 (H) 08/29/2021   LYMPHSABS 9.6 (H) 08/24/2021   MONOABS 9.2 (H) 08/24/2021   EOSABS 0.2 08/24/2021   BASOSABS 0.1 75/64/3329     Last metabolic panel Lab Results  Component Value Date   NA 134 (L) 08/28/2021   K 4.0 08/28/2021   CL 96 (L) 08/28/2021   CO2 30 08/28/2021   BUN 8  08/28/2021   CREATININE 0.56 (L) 08/28/2021   GLUCOSE 121 (H) 08/28/2021   GFRNONAA >60 08/28/2021   GFRAA >60 01/08/2020   CALCIUM 8.7 (L) 08/28/2021   PROT 5.1 (L) 07/14/2021   ALBUMIN 2.2 (L) 07/14/2021   BILITOT 0.4 07/14/2021   ALKPHOS 214 (H) 07/14/2021   AST 28 07/14/2021   ALT 89 (H) 07/14/2021   ANIONGAP 8 08/28/2021    CBG (last 3)  Recent Labs    08/29/21 1209  GLUCAP 133*      Coagulation Profile: No results for input(s): INR, PROTIME in the last 168 hours.   Radiology Studies: No results found.     Hosie Poisson M.D. [facility] 08/29/2021, 5:54 PM  Available via Epic secure chat 7am-7pm After 7 pm, please refer to night coverage provider listed on amion.

## 2021-08-30 ENCOUNTER — Encounter (HOSPITAL_COMMUNITY)
Admission: EM | Disposition: A | Discharge status: Home Health | Payer: Self-pay | Source: Home / Self Care | Attending: Internal Medicine

## 2021-08-30 ENCOUNTER — Other Ambulatory Visit (HOSPITAL_COMMUNITY): Payer: Self-pay

## 2021-08-30 HISTORY — PX: IVC FILTER INSERTION: CATH118245

## 2021-08-30 LAB — CBC
HCT: 28.7 % — ABNORMAL LOW (ref 39.0–52.0)
Hemoglobin: 9.1 g/dL — ABNORMAL LOW (ref 13.0–17.0)
MCH: 28.3 pg (ref 26.0–34.0)
MCHC: 31.7 g/dL (ref 30.0–36.0)
MCV: 89.1 fL (ref 80.0–100.0)
Platelets: 232 10*3/uL (ref 150–400)
RBC: 3.22 MIL/uL — ABNORMAL LOW (ref 4.22–5.81)
RDW: 18.8 % — ABNORMAL HIGH (ref 11.5–15.5)
WBC: 16 10*3/uL — ABNORMAL HIGH (ref 4.0–10.5)
nRBC: 0 % (ref 0.0–0.2)

## 2021-08-30 LAB — HEPARIN LEVEL (UNFRACTIONATED): Heparin Unfractionated: <0.1 IU/mL — ABNORMAL LOW (ref 0.30–0.70)

## 2021-08-30 SURGERY — IVC FILTER INSERTION
Anesthesia: LOCAL

## 2021-08-30 MED ORDER — LABETALOL HCL 5 MG/ML IV SOLN: 10.0000 mg | INTRAVENOUS | Status: DC | PRN

## 2021-08-30 MED ORDER — MIDAZOLAM HCL 2 MG/2ML IJ SOLN
INTRAMUSCULAR | Status: DC | PRN
  Administered 2021-08-30: 1 mg via INTRAVENOUS

## 2021-08-30 MED ORDER — SODIUM CHLORIDE 0.9% FLUSH: 3.0000 mL | INTRAVENOUS | Status: DC | PRN

## 2021-08-30 MED ORDER — SODIUM CHLORIDE 0.9 % IV SOLN: INTRAVENOUS | Status: AC

## 2021-08-30 MED ORDER — HYDRALAZINE HCL 20 MG/ML IJ SOLN: 5.0000 mg | INTRAMUSCULAR | Status: DC | PRN

## 2021-08-30 MED ORDER — LIDOCAINE HCL (PF) 1 % IJ SOLN
INTRAMUSCULAR | Status: AC
  Filled 2021-08-30: qty 30

## 2021-08-30 MED ORDER — IODIXANOL 320 MG/ML IV SOLN
INTRAVENOUS | Status: DC | PRN
  Administered 2021-08-30: 40 mL via INTRAVENOUS

## 2021-08-30 MED ORDER — FENTANYL CITRATE (PF) 100 MCG/2ML IJ SOLN
INTRAMUSCULAR | Status: AC
  Filled 2021-08-30: qty 2

## 2021-08-30 MED ORDER — SODIUM CHLORIDE 0.9 % IV SOLN: 250.0000 mL | INTRAVENOUS | Status: DC | PRN

## 2021-08-30 MED ORDER — SODIUM CHLORIDE 0.9% FLUSH
3.0000 mL | Freq: Two times a day (BID) | INTRAVENOUS | Status: DC
  Administered 2021-08-30 – 2021-09-01 (×3): 3 mL via INTRAVENOUS

## 2021-08-30 MED ORDER — FENTANYL CITRATE (PF) 100 MCG/2ML IJ SOLN
INTRAMUSCULAR | Status: DC | PRN
  Administered 2021-08-30: 25 ug via INTRAVENOUS

## 2021-08-30 MED ORDER — MIDAZOLAM HCL 2 MG/2ML IJ SOLN
INTRAMUSCULAR | Status: AC
  Filled 2021-08-30: qty 2

## 2021-08-30 MED ORDER — LIDOCAINE HCL (PF) 1 % IJ SOLN
INTRAMUSCULAR | Status: DC | PRN
  Administered 2021-08-30: 20 mL

## 2021-08-30 MED ORDER — HEPARIN (PORCINE) IN NACL 1000-0.9 UT/500ML-% IV SOLN
INTRAVENOUS | Status: DC | PRN
  Administered 2021-08-30: 500 mL

## 2021-08-30 MED ORDER — SODIUM CHLORIDE 0.9 % IV SOLN: INTRAVENOUS | Status: DC

## 2021-08-30 MED ORDER — HEPARIN (PORCINE) IN NACL 1000-0.9 UT/500ML-% IV SOLN
INTRAVENOUS | Status: AC
  Filled 2021-08-30: qty 500

## 2021-08-30 SURGICAL SUPPLY — 10 items
FILTER VC CELECT-FEMORAL (Filter) ×1 IMPLANT
KIT MICROPUNCTURE NIT STIFF (SHEATH) ×1 IMPLANT
PROTECTION STATION PRESSURIZED (MISCELLANEOUS) ×2
SHEATH PROBE COVER 6X72 (BAG) ×1 IMPLANT
STATION PROTECTION PRESSURIZED (MISCELLANEOUS) IMPLANT
STOPCOCK MORSE 400PSI 3WAY (MISCELLANEOUS) ×1 IMPLANT
SYR MEDRAD MARK V 150ML (SYRINGE) ×1 IMPLANT
TRAY PV CATH (CUSTOM PROCEDURE TRAY) ×2 IMPLANT
TUBING CONTRAST HIGH PRESS 20 (MISCELLANEOUS) ×1 IMPLANT
WIRE BENTSON .035X145CM (WIRE) ×1 IMPLANT

## 2021-08-30 NOTE — TOC Progression Note (Addendum)
Transition of Care [facility]) - Progression Note    Patient Details  Name: Peretz Thieme MRN: [id number] Date of Birth: 1950-10-25  [facility] CM/SW Contact  Jacalyn Lefevre Edson Snowball, RN Phone Number: 08/30/2021, 10:37 AM  Clinical Narrative:     Patient from home with wife. Possible discharge tomorrow. If discharged tomorrow will need PTAR transportation to address on face sheet. Patient and wife moving Wednesday , if discharge Wednesday will need to coordinate around when his hospital bed gets moved to new address.   Left [place] with [employer] a message. [place] returned call and received above information.   Expected Discharge Plan: [place] Barriers to Discharge: Continued Medical Work up  Expected Discharge Plan and Services Expected Discharge Plan: [place]   Discharge Planning Services: CM Consult Post Acute Care Choice: [place] arrangements for the past 2 months: Single Family Home                 DME Arranged: N/A         HH Arranged: RN, PT, OT           Social Determinants of Health (SDOH) Interventions    Readmission Risk Interventions    08/10/2021   10:44 AM 07/23/2021   11:22 AM 07/01/2021    3:48 PM  Readmission Risk Prevention Plan  Transportation Screening Complete Complete Complete  PCP or Specialist Appt within 3-5 Days  Complete   HRI or [place]  Complete   Social Work Consult for [place] Planning/Counseling  Complete   Palliative Care Screening  Not Applicable   Medication Review Press photographer) Complete Complete Complete  PCP or Specialist appointment within 3-5 days of discharge Complete    HRI or [place] Complete  Complete  SW Recovery Care/Counseling Consult Complete  Complete  Palliative Care Screening Not Applicable  Not [place] Not Applicable  Not Applicable

## 2021-08-30 NOTE — Op Note (Signed)
    Patient name: Douglas [name] MRN: [id number] DOB: 09-27-1950 Sex: male  08/30/2021 Pre-operative Diagnosis: DVT with contraindication anticoagulation Post-operative diagnosis:  Same Surgeon:  Erlene Quan C. Donzetta Matters, MD Procedure Performed: 1.  Ultrasound-guided cannulation left common femoral vein 2.  Central venogram 3.  Placement of Cook Celect IVC filter 4.  Moderate sedation with fentanyl and Versed for 16 minutes   Indications: 71 year old male with recent history of DVT now admitted with hematuria.  He is indicated for IVC filter placement.  Findings: The IVC was patent.  It measures less than 20 mm by venography.  Filter was placed just below the renal veins.   Procedure:  The patient was identified in the holding area and taken to room 8.  The patient was then placed supine on the table and prepped and draped in the usual sterile fashion.  A time out was called.  Fentanyl and Versed were administered and his vital signs were monitored by a procedural nurse throughout the case.  Ultrasound was used to evaluate the left common femoral vein which was noted to be patent and compressible.  The right common femoral vein was not readily identifiable given significant swelling in the groin from recent surgery and what appeared to be a noncompressible vein.  We elected to place the filter in the left side.  The area was anesthetized 1% lidocaine cannulated with micropuncture needle followed by wire and the sheath.  A Bentson wire was placed.  The introducer sheath was placed and central venogram performed.  Renal veins were marked.  Filter was deployed just below these.  Catheter and wire were removed and pressure was held till hemostasis was obtained.  We turned our attention to the right above-knee amputation site where we then remove the staples.  He tolerated all this well without immediate complication.  Contrast: 40 cc  Douglas [name] C. Donzetta Matters, MD [facility] Office:  [phone] Pager: [phone]

## 2021-08-30 NOTE — Progress Notes (Signed)
[facility]                                                                               Douglas [name], is a 71 y.o. male, DOB - 06-Feb-1951, PNT:[id number] Admit date - 08/24/2021    Outpatient Primary MD for the patient is Douglas Pepper, MD  LOS - 5  days    Brief summary    Douglas [name] is a 71 y.o. male with medical history significant of BPH; CLL; and HTN presenting with gross hematuria.  He was discharged from the hospital with a foley, has been there for a while.  His home nurse thought it needed to be changed out to prevent infection.  She tried to change it on 5/15 initially had difficulty replacing it, and meanwhile patient didn't have UOP for hours.  she was able to put in back in around 10 pm on 5/15 and since then , he has gross hematuria and was seen int he ED for further evaluation.   Assessment & Plan    Assessment and Plan:  Gross hematuria probably secondary to traumatic Foley replacement in the setting of anticoagulation Patient has history of urinary retention and indwelling Foley placement. Urology consulted, new large bore catheter placed on traction for tamponade , was on gentle irrigation to prevent new clot formation.Hematuria cleared up with CBI.  But he had three episodes of hematuria every time he was started on IV heparin. So all anticoagulation on hold for now.  Continue finasteride.  Outpatient voiding trial in the outpatient setting.   History of CLL Follows up with Dr. Alen Blew. CT angio of the chest showed adenopathy, recommend outpatient follow up with Dr Alen Blew on discharge.     Essential hypertension Blood pressure parameters are optimal.    Diet-controlled type 2 diabetes mellitus.   History of recent PE/DVT. Patient's aspirin Plavix and Eliquis on hold given gross hematuria. Pt had a submassive PE less than a month ago, with right heart strain. PCCM consulted as we are unable to anticoagulate due to hematuria , to  see if catheter thrombectomy is warranted A repeat CTA and venous duplex ordered. Repeat CTA is negative for PE, venous duplex showed  persistent DVT on the Right lower extremity. Vascular re consulted for IVC filter due to intermittent hematuria on anti coagulation. Scheduled for today. Pt is apprehensive and wants to talk to vascular surgeon before going ahead with IVC.  Echocardiogram repeated, RV systolic function is normal.     Right Limb ischemia S/p right AKA Recently with angiogram and thrombectomy. Aspirin and Plavix are currently on hold due to gross hematuria.   Continue with Crestor.    Recent wide complex tachycardia secondary to SVT. Continue with amiodarone 200 mg daily.  Patient having episodic SVT with paroxysms of pain As needed metoprolol  Hyponatremia:  Improved to 134, and stable.   Acute Anemia of blood loss from hematuria: Hemoglobin dropped from 10.8 to 7.4, 1 prbc transfusion ordered. Repeat hemoglobin around 8.4 improved to 9.  Continue to monitor.     RN Pressure Injury Documentation: Pressure Injury 08/05/21 Heel Left Stage 1 -  Intact skin with non-blanchable redness of a  localized area usually over a bony prominence. redden and blances Foam was on it already. (Active)  08/05/21 1930  Location: Heel  Location Orientation: Left  Staging: Stage 1 -  Intact skin with non-blanchable redness of a localized area usually over a bony prominence.  Wound Description (Comments): redden and blances Foam was on it already.  Present on Admission:   Dressing Type Foam - Lift dressing to assess site every shift 08/29/21 2033     Pressure Injury 08/05/21 Buttocks Right;Left;Mid Stage 1 -  Intact skin with non-blanchable redness of a localized area usually over a bony prominence. blanches redden (Active)  08/05/21 1930  Location: Buttocks  Location Orientation: Right;Left;Mid  Staging: Stage 1 -  Intact skin with non-blanchable redness of a localized area usually  over a bony prominence.  Wound Description (Comments): blanches redden  Present on Admission:   Dressing Type Foam - Lift dressing to assess site every shift 08/29/21 0800   Local wound care.      Estimated body mass index is 29.84 kg/m as calculated from the following:   Height as of this encounter: 6' (1.829 m).   Weight as of this encounter: 99.8 kg.  Code Status: full code.  DVT Prophylaxis:  SCDs Start: 08/24/21 1207   Level of Care: Level of care: Med-Surg Family Communication: none at bedside  Disposition Plan:     Remains inpatient appropriate:   IVC filter Today.   Procedures:  CBI.  CTA of the chest Venous duplex of the lower extremities.   Consultants:   Urology.  PCCM IR.   Antimicrobials:   Anti-infectives (From admission, onward)    Start     Dose/Rate Route Frequency Ordered Stop   08/25/21 1134  cefTRIAXone (ROCEPHIN) 1 g in sodium chloride 0.9 % 100 mL IVPB        1 g 200 mL/hr over 30 Minutes Intravenous Every 24 hours 08/25/21 1131 08/26/21 1942   08/24/21 0745  cefTRIAXone (ROCEPHIN) 1 g in sodium chloride 0.9 % 100 mL IVPB        1 g 200 mL/hr over 30 Minutes Intravenous  Once 08/24/21 0730 08/24/21 1607        Medications  Scheduled Meds:  amiodarone  200 mg Oral Daily   Chlorhexidine Gluconate Cloth  6 each Topical Daily   docusate sodium  100 mg Oral BID   finasteride  5 mg Oral Daily   melatonin  5 mg Oral QHS   rosuvastatin  20 mg Oral Daily   senna-docusate  2 tablet Oral BID   sodium chloride flush  3 mL Intravenous Q12H   Continuous Infusions:  sodium chloride 100 mL/hr at 08/30/21 3710   sodium chloride irrigation     PRN Meds:.acetaminophen **OR** acetaminophen, albuterol, bisacodyl, hydrALAZINE, HYDROmorphone (DILAUDID) injection, metoprolol tartrate, polyethylene glycol, prochlorperazine, traMADol    Subjective:   [name] was seen and examined today.   Wants to talk to the vascular surgeon about removing  sutures on the left stump.   Vitals:   08/30/21 0325 08/30/21 0747 08/30/21 0806 08/30/21 1259  BP: 131/80 135/69    Pulse: 76 71    Resp: 16 18    Temp: 97.6 F (36.4 C) 97.7 F (36.5 C)    TempSrc: Oral Oral    SpO2: 94% 92% 100% 100%  Weight:      Height:        Intake/Output Summary (Last 24 hours) at 08/30/2021 1316 Last data filed at 08/30/2021 [phone]  Gross per 24 hour  Intake 380 ml  Output 3050 ml  Net -2670 ml    Filed Weights   08/24/21 0527  Weight: 99.8 kg     Exam General exam: Appears calm and comfortable  Respiratory system: Clear to auscultation. Respiratory effort normal. Cardiovascular system: S1 & S2 heard, RRR. No JVD, No pedal edema. Gastrointestinal system: Abdomen is nondistended, soft and nontender. Normal bowel sounds heard. Central nervous system: Alert and oriented. No focal neurological deficits. Extremities: left AKA. Skin: No rashes, Psychiatry: Mood & affect appropriate.        Data Reviewed:  I have personally reviewed following labs and imaging studies   CBC Lab Results  Component Value Date   WBC 16.0 (H) 08/30/2021   RBC 3.22 (L) 08/30/2021   HGB 9.1 (L) 08/30/2021   HCT 28.7 (L) 08/30/2021   MCV 89.1 08/30/2021   MCH 28.3 08/30/2021   PLT 232 08/30/2021   MCHC 31.7 08/30/2021   RDW 18.8 (H) 08/30/2021   LYMPHSABS 9.6 (H) 08/24/2021   MONOABS 9.2 (H) 08/24/2021   EOSABS 0.2 08/24/2021   BASOSABS 0.1 51/01/2110     Last metabolic panel Lab Results  Component Value Date   NA 134 (L) 08/28/2021   K 4.0 08/28/2021   CL 96 (L) 08/28/2021   CO2 30 08/28/2021   BUN 8 08/28/2021   CREATININE 0.56 (L) 08/28/2021   GLUCOSE 121 (H) 08/28/2021   GFRNONAA >60 08/28/2021   GFRAA >60 01/08/2020   CALCIUM 8.7 (L) 08/28/2021   PROT 5.1 (L) 07/14/2021   ALBUMIN 2.2 (L) 07/14/2021   BILITOT 0.4 07/14/2021   ALKPHOS 214 (H) 07/14/2021   AST 28 07/14/2021   ALT 89 (H) 07/14/2021   ANIONGAP 8 08/28/2021    CBG (last 3)   Recent Labs    08/29/21 1209  GLUCAP 133*       Coagulation Profile: No results for input(s): INR, PROTIME in the last 168 hours.   Radiology Studies: No results found.     Hosie Poisson M.D. [facility] 08/30/2021, 1:16 PM  Available via Epic secure chat 7am-7pm After 7 pm, please refer to night coverage provider listed on amion.

## 2021-08-30 NOTE — Progress Notes (Signed)
PT Cancellation Note  Patient Details Name: Douglas [name] MRN: [id number] DOB: 08-May-1950   Cancelled Treatment:    Reason Eval/Treat Not Completed: Other (comment) Discussed with MD; plan for IVC filter placement today. Will follow up.  Wyona Almas, PT, DPT Acute Rehabilitation Services Pager [phone] Office [phone]    Deno Etienne 08/30/2021, 10:02 AM

## 2021-08-30 NOTE — Significant Event (Signed)
Patient's nurse notified me that patient was having a recurrence of the frank hematuria.  We will hold heparin for now.  Closely observe.  Gean Birchwood

## 2021-08-30 NOTE — Care Management Important Message (Signed)
Important Message  Patient Details  Name: Douglas [name] MRN: [id number] Date of Birth: 1951-01-17   Medicare Important Message Given:  Yes     Ranferi Clingan Montine Circle 08/30/2021, 4:17 PM

## 2021-08-30 NOTE — Interval H&P Note (Signed)
History and Physical Interval Note:  08/30/2021 6:39 PM  Douglas [name]  has presented today for surgery, with the diagnosis of clot.  The various methods of treatment have been discussed with the patient and family. After consideration of risks, benefits and other options for treatment, the patient has consented to  Procedure(s): IVC FILTER INSERTION (N/A) as a surgical intervention.  The patient's history has been reviewed, patient examined, no change in status, stable for surgery.  I have reviewed the patient's chart and labs.  Questions were answered to the patient's satisfaction.     Servando Snare

## 2021-08-31 ENCOUNTER — Encounter (HOSPITAL_COMMUNITY): Payer: Self-pay | Admitting: Vascular Surgery

## 2021-08-31 LAB — CBC
HCT: 32 % — ABNORMAL LOW (ref 39.0–52.0)
Hemoglobin: 9.8 g/dL — ABNORMAL LOW (ref 13.0–17.0)
MCH: 27.8 pg (ref 26.0–34.0)
MCHC: 30.6 g/dL (ref 30.0–36.0)
MCV: 90.7 fL (ref 80.0–100.0)
Platelets: 263 10*3/uL (ref 150–400)
RBC: 3.53 MIL/uL — ABNORMAL LOW (ref 4.22–5.81)
RDW: 18.9 % — ABNORMAL HIGH (ref 11.5–15.5)
WBC: 15.9 10*3/uL — ABNORMAL HIGH (ref 4.0–10.5)
nRBC: 0 % (ref 0.0–0.2)

## 2021-08-31 LAB — BASIC METABOLIC PANEL
Anion gap: 8 (ref 5–15)
BUN: 6 mg/dL — ABNORMAL LOW (ref 8–23)
CO2: 28 mmol/L (ref 22–32)
Calcium: 8.9 mg/dL (ref 8.9–10.3)
Chloride: 97 mmol/L — ABNORMAL LOW (ref 98–111)
Creatinine, Ser: 0.52 mg/dL — ABNORMAL LOW (ref 0.61–1.24)
GFR, Estimated: >60 mL/min (ref >60)
Glucose, Bld: 136 mg/dL — ABNORMAL HIGH (ref 70–99)
Potassium: 4.2 mmol/L (ref 3.5–5.1)
Sodium: 133 mmol/L — ABNORMAL LOW (ref 135–145)

## 2021-08-31 MED ORDER — FLEET ENEMA 7-19 GM/118ML RE ENEM
1.0000 | ENEMA | Freq: Once | RECTAL | Status: AC
  Administered 2021-08-31: 1 via RECTAL
  Filled 2021-08-31: qty 1

## 2021-08-31 NOTE — Progress Notes (Signed)
[facility]                                                                               Carmin Dibartolo, is a 71 y.o. male, DOB - [birth year], XTG:[id number] Admit date - 08/24/2021    Outpatient Primary MD for the patient is [name], MD  LOS - 6  days    Brief summary    Harce Volden is a 71 y.o. male with medical history significant of BPH; CLL; and HTN, PE, DVT, limb ischemia on eliquis, aspirin and plavix presenting with gross hematuria.  He was discharged from the hospital with a foley, has been there for a while.  His home nurse thought it needed to be changed out to prevent infection.  She tried to change it on 5/15 initially had difficulty replacing it, and meanwhile patient didn't have UOP for hours.  she was able to put in back in around 10 pm on 5/15 and since then , he has gross hematuria and was seen in the ED for further evaluation. All anticoagulation was held. Urology consulted, pt was on CBI, urine cleared up. Repeat imaging of the chest did not show any PE, venous duplex showed RLE dvt. Unable to anti coagulate him due to intermittent hematuria on anticoagulation. Vascular surgery consulted and he underwent IVC filter placement. Currently waiting for vascular surgery recommendations on plavix and aspirin.   Assessment & Plan    Assessment and Plan:  Gross hematuria probably secondary to traumatic Foley replacement in the setting of anticoagulation Patient has history of urinary retention and indwelling Foley placement. Urology consulted, new large bore catheter placed on traction for tamponade , was on gentle irrigation to prevent new clot formation.Hematuria cleared up with CBI.  But he had three episodes of hematuria every time he was started on IV heparin. So all anticoagulation on hold for now.  Continue finasteride.  Outpatient voiding trial in the outpatient setting. No more episodes of hematuria.    History of CLL Follows up with Dr.  Alen Blew. CT angio of the chest showed adenopathy, recommend outpatient follow up with Dr Alen Blew on discharge.     Essential hypertension Blood pressure parameters are well controlled.    Diet-controlled type 2 diabetes mellitus.   History of recent PE/DVT. Patient's aspirin Plavix and Eliquis on hold given gross hematuria. Pt had a submassive PE less than a month ago, with right heart strain. PCCM consulted as we are unable to anticoagulate due to hematuria , to see if catheter thrombectomy is warranted A repeat CTA and venous duplex ordered. Repeat CTA is negative for PE, venous duplex showed  persistent DVT on the Right lower extremity. Vascular re consulted for IVC filter due to intermittent hematuria on anti coagulation. Patient underwent IVC filter on 08/30/21. Echocardiogram repeated, RV systolic function is normal.     Right Limb ischemia S/p right AKA Recently with angiogram and thrombectomy. Aspirin and Plavix are currently on hold due to gross hematuria.   Continue with Crestor. Unclear if we should hold aspirin and plavix for another week or restart on discharge.     Recent wide complex tachycardia secondary to SVT. Continue with amiodarone  200 mg daily.  Patient having episodic SVT with paroxysms of pain As needed metoprolol  Hyponatremia:  Improved to 134, and stable.   Acute Anemia of blood loss from hematuria: Hemoglobin dropped from 10.8 to 7.4, 1 prbc transfusion ordered. Repeat hemoglobin around 8.4 improved to 9.  Continue to monitor.     RN Pressure Injury Documentation: Pressure Injury 08/05/21 Heel Left Stage 1 -  Intact skin with non-blanchable redness of a localized area usually over a bony prominence. redden and blances Foam was on it already. (Active)  08/05/21 1930  Location: Heel  Location Orientation: Left  Staging: Stage 1 -  Intact skin with non-blanchable redness of a localized area usually over a bony prominence.  Wound Description  (Comments): redden and blances Foam was on it already.  Present on Admission:   Dressing Type Foam - Lift dressing to assess site every shift 08/31/21 0800     Pressure Injury 08/05/21 Buttocks Right;Left;Mid Stage 1 -  Intact skin with non-blanchable redness of a localized area usually over a bony prominence. blanches redden (Active)  08/05/21 1930  Location: Buttocks  Location Orientation: Right;Left;Mid  Staging: Stage 1 -  Intact skin with non-blanchable redness of a localized area usually over a bony prominence.  Wound Description (Comments): blanches redden  Present on Admission:   Dressing Type Foam - Lift dressing to assess site every shift 08/31/21 0800   Local wound care.      Estimated body mass index is 29.84 kg/m as calculated from the following:   Height as of this encounter: 6' (1.829 m).   Weight as of this encounter: 99.8 kg.  Code Status: full code.  DVT Prophylaxis:  SCDs Start: 08/24/21 1207   Level of Care: Level of care: Progressive Cardiac Family Communication: none at bedside  Disposition Plan:     Remains inpatient appropriate:  discharge in the morning.   Procedures:  CBI.  CTA of the chest Venous duplex of the lower extremities.   Consultants:   Urology.  PCCM IR.   Antimicrobials:   Anti-infectives (From admission, onward)    Start     Dose/Rate Route Frequency Ordered Stop   08/25/21 1134  cefTRIAXone (ROCEPHIN) 1 g in sodium chloride 0.9 % 100 mL IVPB        1 g 200 mL/hr over 30 Minutes Intravenous Every 24 hours 08/25/21 1131 08/26/21 1942   08/24/21 0745  cefTRIAXone (ROCEPHIN) 1 g in sodium chloride 0.9 % 100 mL IVPB        1 g 200 mL/hr over 30 Minutes Intravenous  Once 08/24/21 0730 08/24/21 6387        Medications  Scheduled Meds:  amiodarone  200 mg Oral Daily   Chlorhexidine Gluconate Cloth  6 each Topical Daily   docusate sodium  100 mg Oral BID   finasteride  5 mg Oral Daily   melatonin  5 mg Oral QHS    rosuvastatin  20 mg Oral Daily   senna-docusate  2 tablet Oral BID   sodium chloride flush  3 mL Intravenous Q12H   sodium chloride flush  3 mL Intravenous Q12H   Continuous Infusions:  sodium chloride     sodium chloride irrigation     PRN Meds:.sodium chloride, acetaminophen **OR** acetaminophen, albuterol, bisacodyl, hydrALAZINE, hydrALAZINE, HYDROmorphone (DILAUDID) injection, labetalol, metoprolol tartrate, polyethylene glycol, prochlorperazine, sodium chloride flush, traMADol    Subjective:   Arend Lowy was seen and examined today.   Constipated, requesting for enema. Nauseated  .  Vitals:   08/30/21 2320 08/31/21 0438 08/31/21 0558 08/31/21 0820  BP:  (!) 147/91  (!) 142/64  Pulse:  84  72  Resp:  17  19  Temp:  97.6 F (36.4 C)  97.8 F (36.6 C)  TempSrc:  Oral  Oral  SpO2: 98% 97% 98% 98%  Weight:      Height:        Intake/Output Summary (Last 24 hours) at 08/31/2021 1523 Last data filed at 08/31/2021 1013 Gross per 24 hour  Intake 250.75 ml  Output 1925 ml  Net -1674.25 ml    Filed Weights   08/24/21 0527  Weight: 99.8 kg     Exam General exam: elderly gentleman, not in distress.  Respiratory system: Clear to auscultation. Respiratory effort normal. Cardiovascular system: S1 & S2 heard, RRR. No JVD, No pedal edema. Gastrointestinal system: Abdomen is nondistended, soft and nontender. Normal bowel sounds heard. Central nervous system: Alert and oriented. NON FOCAL.  Extremities: right AKA.  Skin: No rashes seen.  Psychiatry: Mood & affect appropriate.         Data Reviewed:  I have personally reviewed following labs and imaging studies   CBC Lab Results  Component Value Date   WBC 15.9 (H) 08/31/2021   RBC 3.53 (L) 08/31/2021   HGB 9.8 (L) 08/31/2021   HCT 32.0 (L) 08/31/2021   MCV 90.7 08/31/2021   MCH 27.8 08/31/2021   PLT 263 08/31/2021   MCHC 30.6 08/31/2021   RDW 18.9 (H) 08/31/2021   LYMPHSABS 9.6 (H) 08/24/2021    MONOABS 9.2 (H) 08/24/2021   EOSABS 0.2 08/24/2021   BASOSABS 0.1 40/01/2724     Last metabolic panel Lab Results  Component Value Date   NA 134 (L) 08/28/2021   K 4.0 08/28/2021   CL 96 (L) 08/28/2021   CO2 30 08/28/2021   BUN 8 08/28/2021   CREATININE 0.56 (L) 08/28/2021   GLUCOSE 121 (H) 08/28/2021   GFRNONAA >60 08/28/2021   GFRAA >60 01/08/2020   CALCIUM 8.7 (L) 08/28/2021   PROT 5.1 (L) 07/14/2021   ALBUMIN 2.2 (L) 07/14/2021   BILITOT 0.4 07/14/2021   ALKPHOS 214 (H) 07/14/2021   AST 28 07/14/2021   ALT 89 (H) 07/14/2021   ANIONGAP 8 08/28/2021    CBG (last 3)  Recent Labs    08/29/21 1209  GLUCAP 133*       Coagulation Profile: No results for input(s): INR, PROTIME in the last 168 hours.   Radiology Studies: PERIPHERAL VASCULAR CATHETERIZATION  Result Date: 08/30/2021 Images from the original result were not included. Patient name: Hamsa Laurich MRN: [id number] DOB: 09-08-1950 Sex: male 08/30/2021 Pre-operative Diagnosis: DVT with contraindication anticoagulation Post-operative diagnosis:  Same Surgeon:  Erlene Quan C. Donzetta Matters, MD Procedure Performed: 1.  Ultrasound-guided cannulation left common femoral vein 2.  Central venogram 3.  Placement of Cook Celect IVC filter 4.  Moderate sedation with fentanyl and Versed for 16 minutes Indications: 71 year old male with recent history of DVT now admitted with hematuria.  He is indicated for IVC filter placement. Findings: The IVC was patent.  It measures less than 20 mm by venography.  Filter was placed just below the renal veins.  Procedure:  The patient was identified in the holding area and taken to room 8.  The patient was then placed supine on the table and prepped and draped in the usual sterile fashion.  A time out was called.  Fentanyl and Versed were administered and his vital signs were  monitored by a procedural nurse throughout the case.  Ultrasound was used to evaluate the left common femoral vein which was noted to  be patent and compressible.  The right common femoral vein was not readily identifiable given significant swelling in the groin from recent surgery and what appeared to be a noncompressible vein.  We elected to place the filter in the left side.  The area was anesthetized 1% lidocaine cannulated with micropuncture needle followed by wire and the sheath.  A Bentson wire was placed.  The introducer sheath was placed and central venogram performed.  Renal veins were marked.  Filter was deployed just below these.  Catheter and wire were removed and pressure was held till hemostasis was obtained.  We turned our attention to the right above-knee amputation site where we then remove the staples.  He tolerated all this well without immediate complication. Contrast: 40 cc Brandon C. Donzetta Matters, MD [facility] Office: [phone] Pager: [phone]       Hosie Poisson M.D. [facility] 08/31/2021, 3:23 PM  Available via Epic secure chat 7am-7pm After 7 pm, please refer to night coverage provider listed on amion.

## 2021-08-31 NOTE — Progress Notes (Addendum)
[facility]  Subjective  - Nausea this am   Objective (!) 147/91 84 97.6 F (36.4 C) (Oral) 17 98%  Intake/Output Summary (Last 24 hours) at 08/31/2021 0732 Last data filed at 08/31/2021 0430 Gross per 24 hour  Intake 250.75 ml  Output 1100 ml  Net -849.25 ml    Left LE M/sensation intact Left groin soft Right AKA incision healing well   Assessment/Planning: POD # 1 IVC filter placement due to inability to tolerate anticoagulation  VASCULAR SURGICAL HISTORY:  Right common femoral to below-knee popliteal artery bypass with greater saphenous vein 06/22/2021 Right below-knee amputation 06/25/2021 Right above-knee amputation 07/19/2021 Left superficial femoral artery stenting 08/04/2021 Left lower extremity and pelvic venous mechanical thrombectomy 1/82/9937 (complicated by pulmonary embolism)  ASA and Plavix on hold for hematuria.  HGB improving Cont Statin   Roxy Horseman 08/31/2021 7:32 AM --  Laboratory Lab Results: Recent Labs    08/30/21 0452 08/31/21 0321  WBC 16.0* 15.9*  HGB 9.1* 9.8*  HCT 28.7* 32.0*  PLT 232 263   BMET No results for input(s): NA, K, CL, CO2, GLUCOSE, BUN, CREATININE, CALCIUM in the last 72 hours.  COAG Lab Results  Component Value Date   INR 1.1 08/04/2021   INR 1.0 08/03/2021   INR 1.0 06/18/2021   No results found for: PTT   I have independently interviewed and examined patient and agree with PA assessment and plan above.  We are planning to have him follow-up now in 5 to 6 weeks for left lower extremity arterial evaluation.  Given his recent issues with clotting and then hematuria I have discussed with the patient and his wife that the filter should remain indefinitely until he has healed up from all of his current issues, is without indwelling Foley catheter and has regained strength and mobility.  Tilman Mcclaren C. Donzetta Matters, MD [facility] Office:  [phone] Pager: [phone]

## 2021-08-31 NOTE — Evaluation (Signed)
Physical Therapy Evaluation Patient Details Name: Douglas [name] MRN: [id number] DOB: [birth year] Today's Date: 08/31/2021  History of Present Illness  Douglas [name] is a 71 y.o. male with medical history significant of BPH; CLL; and HTN presenting with gross hematuria.  He was discharged from the hospital with a foley, has been there for a while.  His home nurse thought it needed to be changed out to prevent infection.  She tried to change it yesterday AM.  She didn't realize it was in the prostate and curling up.  He didn't have UOP for hours.  She changed it again with success and he had normal urine until about 10pm and it has been bleeding significantly since.  Right now, he is having significant burning in the tip of his penis.  He has the sensation that he wants to pee.  It causes him to feel hot and sweaty and it makes him feel SOB.     He was last admitted from 4/25-28 with hyponatremia and limb ischemia.  He underwent angiogram and thrombectomy and was started on ASA and Plavix as well as Eliquis. S/P IVC filter placement L LE 08/30/21   Clinical Impression  Patient received in bed, agreeable to PT assessment. Wife present. Patient required mod assist for side-lying to sit and maintain sitting balance. He rolls in bed with mod independence. Patient has been unable to transfer since AKA surgery. He will benefit from continued skilled PT to improve mobility, strength and safety to reduce caregiver burden.         Recommendations for follow up therapy are one component of a multi-disciplinary discharge planning process, led by the attending physician.  Recommendations may be updated based on patient status, additional functional criteria and insurance authorization.  Follow Up Recommendations Home health PT    Assistance Recommended at Discharge Frequent or constant Supervision/Assistance  Patient can return home with the following  Assistance with cooking/housework;Assist for  transportation;Help with stairs or ramp for entrance;A lot of help with bathing/dressing/bathroom;A lot of help with walking and/or transfers    Equipment Recommendations None recommended by PT  Recommendations for Other Services       Functional Status Assessment Patient has had a recent decline in their functional status and demonstrates the ability to make significant improvements in function in a reasonable and predictable amount of time.     Precautions / Restrictions Precautions Precautions: Fall Precaution Comments: R AKA 07/19/21 Restrictions Weight Bearing Restrictions: Yes RLE Weight Bearing: Non weight bearing      Mobility  Bed Mobility Overal bed mobility: Needs Assistance Bed Mobility: Rolling, Supine to Sit, Sit to Supine Rolling: Modified independent (Device/Increase time)   Supine to sit: Mod assist, HOB elevated Sit to supine: Min assist   General bed mobility comments: Mod assist to raise trunk to sitting position and maintain sitting balance initially. Improved balance with increased time.    Transfers                   General transfer comment: unable at this time    Ambulation/Gait               General Gait Details: unable  Stairs            Wheelchair Mobility    Modified Rankin (Stroke Patients Only)       Balance Overall balance assessment: Needs assistance Sitting-balance support: Feet supported, Bilateral upper extremity supported Sitting balance-Leahy Scale: Fair Sitting balance - Comments: lob to left side  x 2 with initial sitting balance. Postural control: Left lateral lean     Standing balance comment: unable                             Pertinent Vitals/Pain Pain Assessment Pain Assessment: No/denies pain    Home Living Family/patient expects to be discharged to:: Private residence Living Arrangements: Spouse/significant other Available Help at Discharge: Family;Available 24  hours/day Type of Home: House     Entrance Stairs-Number of Steps: moving to entry level apartment tomorrow   Home Layout: One level [place] Hospital bed;Wheelchair - manual;BSC/3in1;Other (comment) Additional Comments: recent admission from [facility] following BKA. Then had AKA, has not been able to transfer since, but has been working with PT at home    Prior Function Prior Level of Function : Needs assist             Mobility Comments: Pt was in [facility] 07/01/21 - 07/19/21 s/p R BKA 06/25/21 and was working on sliding board transfers. Once home 07/23/21 s/p R AKA 07/19/21, pt was working on sitting balance EOB with HHPT. At home, needed assistance with bed mobility, even rolling due to weakness in core. Could sit EOB 30-60 min at a time. Pt had not performed sliding board transfer with wife since d/c home. ADLs Comments: Wife assisted with bathing and dressing in hospital bed. Pt using bed pan or depends. Called emergency services for transport in and out of home.     Hand Dominance   Dominant Hand: Right    Extremity/Trunk Assessment   Upper Extremity Assessment Upper Extremity Assessment: Generalized weakness    Lower Extremity Assessment Lower Extremity Assessment: Generalized weakness LLE Sensation: WNL    Cervical / Trunk Assessment Cervical / Trunk Assessment: Normal  Communication   Communication: No difficulties  Cognition Arousal/Alertness: Awake/alert Behavior During Therapy: WFL for tasks assessed/performed Overall Cognitive Status: Within Functional Limits for tasks assessed                                 General Comments: Motivated today and eager to mobilize.        General Comments      Exercises     Assessment/Plan    PT Assessment Patient needs continued PT services  PT Problem List Decreased strength;Decreased activity tolerance;Decreased balance;Decreased mobility;Impaired sensation;Decreased range of motion;Decreased skin  integrity;Cardiopulmonary status limiting activity       PT Treatment Interventions Gait training;DME instruction;Therapeutic activities;Functional mobility training;Balance training;Therapeutic exercise;Patient/family education;Neuromuscular re-education;Wheelchair mobility training    PT Goals (Current goals can be found in the Care Plan section)  Acute Rehab PT Goals Patient Stated Goal: to improve and go home, re-start home health PT Goal Formulation: With patient/family Time For Goal Achievement: 09/08/21 Potential to Achieve Goals: Good    Frequency Min 3X/week     Co-evaluation               AM-PAC PT "6 Clicks" Mobility  Outcome Measure Help needed turning from your back to your side while in a flat bed without using bedrails?: A Lot Help needed moving from lying on your back to sitting on the side of a flat bed without using bedrails?: A Lot Help needed moving to and from a bed to a chair (including a wheelchair)?: Total Help needed standing up from a chair using your arms (e.g., wheelchair or bedside chair)?: Total Help  needed to walk in hospital room?: Total Help needed climbing 3-5 steps with a railing? : Total 6 Click Score: 8    End of Session   Activity Tolerance: Patient tolerated treatment well Patient left: in bed;with call bell/phone within reach;with bed alarm set;with family/visitor present Nurse Communication: Mobility status PT Visit Diagnosis: Muscle weakness (generalized) (M62.81);Other abnormalities of gait and mobility (R26.89)    Time: 3212-2482 PT Time Calculation (min) (ACUTE ONLY): 33 min   Charges:   PT Evaluation $PT Eval Moderate Complexity: 1 Mod PT Treatments $Therapeutic Activity: 8-22 mins        Kripa Foskey, PT, GCS 08/31/21,3:20 PM

## 2021-09-01 LAB — CBC
HCT: 32.1 % — ABNORMAL LOW (ref 39.0–52.0)
Hemoglobin: 10.1 g/dL — ABNORMAL LOW (ref 13.0–17.0)
MCH: 27.9 pg (ref 26.0–34.0)
MCHC: 31.5 g/dL (ref 30.0–36.0)
MCV: 88.7 fL (ref 80.0–100.0)
Platelets: 237 10*3/uL (ref 150–400)
RBC: 3.62 MIL/uL — ABNORMAL LOW (ref 4.22–5.81)
RDW: 18.5 % — ABNORMAL HIGH (ref 11.5–15.5)
WBC: 15.9 10*3/uL — ABNORMAL HIGH (ref 4.0–10.5)
nRBC: 0 % (ref 0.0–0.2)

## 2021-09-01 MED ORDER — AMIODARONE HCL 200 MG PO TABS: 200.0000 mg | ORAL_TABLET | Freq: Every day | ORAL | 2 refills | Status: DC

## 2021-09-01 NOTE — Progress Notes (Signed)
Pt dressed and ready to discharge.. refusing night meds at this time.

## 2021-09-01 NOTE — Progress Notes (Signed)
[facility] here to discharge pt out of facility.[name] discharge paperwork in hand with pt, belongings with pt, no iv, all needs addressed, pt ready to leave facility

## 2021-09-01 NOTE — TOC Progression Note (Addendum)
Transition of Care [facility]) - Progression Note    Patient Details  Name: Douglas [name] MRN: [id number] Date of Birth: 11-11-1950  Transition of Care [facility]) CM/SW Contact  Yarenis Cerino, Edson Snowball, RN Phone Number: 09/01/2021, 1:22 PM  Clinical Narrative:     [place] with [facility] aware discharge is today. Patient's new address is address in computer.   Patient's wife has called [facility] to have his home hospital moved . She was told a driver would call her . As of yet she has not been called. NCM called Chrys Racer with [facility] . Chrys Racer checked and a driver will call patient's wife shortly. [facility] aware patient is discharging today via [facility] once bed is moved.   Once bed is moved, wife will call patient and patient will notify staff to call [facility] for transport   Expected Discharge Plan: [place] Barriers to Discharge: Continued Medical Work up  Expected Discharge Plan and Services Expected Discharge Plan: [place]   Discharge Planning Services: CM Consult Post Acute Care Choice: [place] arrangements for the past 2 months: Single Family Home Expected Discharge Date: 09/01/21               DME Arranged: N/A         HH Arranged: RN, PT, OT           Social Determinants of Health (SDOH) Interventions    Readmission Risk Interventions    08/10/2021   10:44 AM 07/23/2021   11:22 AM 07/01/2021    3:48 PM  Readmission Risk Prevention Plan  Transportation Screening Complete Complete Complete  PCP or Specialist Appt within 3-5 Days  Complete   HRI or Home Care Consult  Complete   Social Work Consult for [place] Planning/Counseling  Complete   Palliative Care Screening  Not Applicable   Medication Review Press photographer) Complete Complete Complete  PCP or Specialist appointment within 3-5 days of discharge Complete    HRI or [place] Complete  Complete  SW Recovery Care/Counseling Consult Complete  Complete   Palliative Care Screening Not Applicable  Not [place] Not Applicable  Not Applicable

## 2021-09-01 NOTE — Discharge Summary (Signed)
Physician Discharge Summary  Jeb Schloemer ALP:[id number] DOB: February 23, 1951 DOA: 08/24/2021  PCP: [name], MD  Admit date: 08/24/2021 Discharge date: 09/01/2021  Admitted From: Home Disposition: Home  Recommendations for Outpatient Follow-up:  Follow up with PCP in 1-2 weeks Follow-up with urology, Dr. Tresa Moore in 1 month Follow-up with vascular surgery, Dr. Donzetta Matters Discontinued aspirin/Plavix, Eliquis for hematuria Please obtain BMP/CBC in one week Consider restart aspirin/Plavix in 1 week if hemoglobin remains stable.  Home Health: PT Equipment/Devices: None  Discharge Condition: Stable CODE STATUS: Full code Diet recommendation: Heart healthy diet  History of present illness:  Nethaniel Mattie is a 71 year old male with past medical history significant for BPH, CLL, essential hypertension, history of PE/DVT, history of limb ischemia on anticoagulation/antiplatelet with Eliquis, aspirin and Plavix who presented to [facility] ED on 5/16 with gross hematuria.  Patient was recently discharged from the hospital with a Foley catheter and follows with urology outpatient.  His home health nurse thought it need to be changed out to prevent infection, when she tried to exchange on 5/15 she had difficulty replacing it with patient having no urine output for several hours.  She was able to place the Foley catheter back in around 10 PM on 5/15 with subsequent gross hematuria.  Urology was consulted, hospital service consulted for further evaluation management of hematuria likely secondary to trauma from Foley catheter insertion.  Hospital course:  Gross hematuria Acute blood loss anemia Patient presenting to the ED with hematuria likely traumatic from Foley catheter placement in the setting of anticoagulation/antiplatelet use with Eliquis, Plavix, and aspirin.  Patient with history of urinary retention requiring indwelling Foley catheter.  Urology was consulted and new large bore catheter was placed on  traction for tamponade and underwent continuous bladder irrigation.  When attempted anticoagulation with heparin, he continued to have episodes of hematuria and now anticoagulation has been discontinued.  Patient received 1 unit PRBC during hospitalization.  Hematuria now resolved, hemoglobin stable, 10.1 at time of discharge.  Continue finasteride and outpatient follow-up with urology, Dr. Tresa Moore in 1 month for a voiding trial.  History of CLL CT angiogram chest notable for adenopathy, continue outpatient follow-up with medical oncology, Dr. Alen Blew.  Essential hypertension Currently controlled off of antihypertensive therapy.  Outpatient follow-up with PCP.  Type 2 diabetes mellitus Diet controlled.  Outpatient follow-up with PCP.  Right limb ischemia s/p right AKA Follows with vascular surgery, Dr. Donzetta Matters.  Recent angiogram with thrombectomy.  Continue Crestor.  Currently holding aspirin and Plavix on discharge due to gross hematuria.  Recommend follow-up with vascular surgery/PCP 1 week for repeat labs, if hemoglobin remained stable as likely can restart aspirin and Plavix.  Recent wide-complex tachycardia secondary to SVT Started on amiodarone 2 mg p.o. daily.  Hyponatremia: Improved to 134 at time of discharge.  Stable.  Outpatient follow-up PCP, recommend repeat BMP 1 week.  History of PE/DVT Patient with a submassive PE less than 1 month ago with right heart strain.  Was discharged on Eliquis.  Repeat CT angiogram chest negative for PE.  Venous duplex ultrasound lower extremities shows persistent DVT right lower extremity.  Vascular surgery was consulted and patient underwent IVC filter placement due to continued intermittent hematuria on anticoagulation on 08/31/2021.  IVC filter likely to remain indefinitely per vascular surgery.  Pressure injury Pressure Injury 08/05/21 Heel Left Stage 1 -  Intact skin with non-blanchable redness of a localized area usually over a bony prominence.  redden and blances Foam was on it already. (Active)  08/05/21 1930  Location: Heel  Location Orientation: Left  Staging: Stage 1 -  Intact skin with non-blanchable redness of a localized area usually over a bony prominence. (redden and blanches)  Wound Description (Comments): redden and blances Foam was on it already.  Present on Admission:      Pressure Injury 08/05/21 Buttocks Right;Left;Mid Stage 1 -  Intact skin with non-blanchable redness of a localized area usually over a bony prominence. blanches redden (Active)  08/05/21 1930  Location: Buttocks  Location Orientation: Right;Left;Mid  Staging: Stage 1 -  Intact skin with non-blanchable redness of a localized area usually over a bony prominence.  Wound Description (Comments): blanches redden  Present on Admission:   --Continue local wound care, offloading   Discharge Diagnoses:  Principal Problem:   Gross hematuria Active Problems:   CLL (chronic lymphocytic leukemia) ([place])   Hypertension   Diabetes mellitus ([place])   BPH (benign prostatic hyperplasia)   PAD (peripheral artery disease) ([facility])   Urine retention   SVT (supraventricular tachycardia) ([facility])   Thromboembolism ([facility])   Hematuria   Pulmonary embolism ([facility])    Discharge Instructions  Discharge Instructions     Diet - low sodium heart healthy   Complete by: As directed    Discharge wound care:   Complete by: As directed    Continue local wound care, offloading to left heel and right buttock stage I pressure injuries   Increase activity slowly   Complete by: As directed       Allergies as of 09/01/2021   No Known Allergies      Medication List     STOP taking these medications    apixaban 5 MG Tabs tablet Commonly known as: ELIQUIS   aspirin EC 81 MG tablet   clopidogrel 75 MG tablet Commonly known as: PLAVIX   Eliquis DVT/PE Starter Pack Generic drug: Apixaban Starter Pack ('10mg'$  and '5mg'$ )       TAKE these medications    acetaminophen  500 MG tablet Commonly known as: TYLENOL Take 500 mg by mouth at bedtime.   albuterol (2.5 MG/3ML) 0.083% nebulizer solution Commonly known as: PROVENTIL Take 2.5 mg by nebulization every 6 (six) hours as needed for wheezing or shortness of breath.   amiodarone 200 MG tablet Commonly known as: PACERONE Take 1 tablet (200 mg total) by mouth daily.   docusate sodium 100 MG capsule Commonly known as: COLACE Take 1 capsule (100 mg total) by mouth 2 (two) times daily. What changed:  when to take this reasons to take this   finasteride 5 MG tablet Commonly known as: PROSCAR Take 1 tablet (5 mg total) by mouth daily.   melatonin 5 MG Tabs Take 1 tablet (5 mg total) by mouth at bedtime.   methocarbamol 500 MG tablet Commonly known as: ROBAXIN Take 1 tablet (500 mg total) by mouth 3 (three) times daily. What changed:  when to take this reasons to take this   oxymetazoline 0.05 % nasal spray Commonly known as: AFRIN Place 1 spray into both nostrils 2 (two) times daily as needed for congestion.   MUCINEX NASAL SPRAY FULL FORCE NA Place 1 spray into the nose 2 (two) times daily as needed (congestion).   rosuvastatin 20 MG tablet Commonly known as: CRESTOR Take 1 tablet (20 mg total) by mouth daily.   vitamin C 1000 MG tablet Take 1 tablet (1,000 mg total) by mouth daily.  Discharge Care Instructions  (From admission, onward)           Start     Ordered   09/01/21 0000  Discharge wound care:       Comments: Continue local wound care, offloading to left heel and right buttock stage I pressure injuries   09/01/21 1016            Follow-up Information     Alexis Frock, MD Follow up.   Specialty: Urology Why: Office will call to arrange follow up visit and trial of catheter removal in about a month. Contact information: [address] [place] [address] [phone]         [facility] Follow up.   Specialty: Home  Health Services Why: Agency is now known as [employer]. The office will follow up to schedule home health visits. Contact information: [place] [address] [phone]         Waynetta Sandy, MD. Schedule an appointment as soon as possible for a visit.   Specialties: Vascular Surgery, Cardiology Contact information: [place] [address] [phone]         [name], MD. Schedule an appointment as soon as possible for a visit in 1 week(s).   Specialty: Family Medicine Contact information: [place]  [address] [phone]                No Known Allergies  Consultations: PCCM Vascular surgery, Dr. Donzetta Matters Urology, Dr. Tresa Moore   Procedures/Studies: CT Angio Chest Pulmonary Embolism (PE) W or WO Contrast  Result Date: 08/26/2021 CLINICAL DATA:  CLL, history of pulmonary embolism. EXAM: CT ANGIOGRAPHY CHEST WITH CONTRAST TECHNIQUE: Multidetector CT imaging of the chest was performed using the standard protocol during bolus administration of intravenous contrast. Multiplanar CT image reconstructions and MIPs were obtained to evaluate the vascular anatomy. RADIATION DOSE REDUCTION: This exam was performed according to the departmental dose-optimization program which includes automated exposure control, adjustment of the mA and/or kV according to patient size and/or use of iterative reconstruction technique. CONTRAST:  175m OMNIPAQUE IOHEXOL 350 MG/ML SOLN COMPARISON:  08/08/2021 FINDINGS: Cardiovascular: Satisfactory opacification of the pulmonary arteries to the segmental level. No evidence of pulmonary embolism. Previously noted lobar and segmental pulmonary emboli in the left lower lobe, right upper lobe, right middle lobe, and right lower lobe is no longer seen. Normal heart size. No pericardial effusion. No reflux of contrast into the hepatic veins. Left chest cardiac device. Mediastinum/Nodes:  Prominent supraclavicular, axial, and mediastinal lymph nodes, the majority of which are not enlarged by CT size criteria do appear larger than on the prior exam. For example a right paratracheal lymph node measures up to 1.1 cm in short axis (series 7, image 70), previously 0.6 cm. A 1.6 cm left prevascular lymph node measures up to 1.6 cm in short axis, previously 0.9 cm; this lymph node does meet size criteria for lymphadenopathy. The imaged thyroid trachea, and esophagus are unremarkable. Lungs/Pleura: Unchanged 4 mm nodule in the right lower lobe (series 7, image 195). No new pulmonary nodules. No focal pulmonary opacity or pleural effusion. Redemonstrated left apical scarring. Upper Abdomen: Redemonstrated hypervascular splenic lesion. No other acute process in the abdomen. Musculoskeletal: No chest wall abnormality. No acute or significant osseous findings. Review of the MIP images confirms the above findings. IMPRESSION: 1. Negative for pulmonary embolism. Previously noted pulmonary emboli are no longer seen. 2. Enlarged mediastinal lymph node, with  other prominent supraclavicular, axial, and mediastinal lymph nodes, which have increased in size since the prior exam. These could be reactive but are concerning given the patient's history of CLL. Electronically Signed   By: Merilyn Baba M.D.   On: 08/26/2021 15:01   CT Angio Chest Pulmonary Embolism (PE) W or WO Contrast  Addendum Date: 08/08/2021   ADDENDUM REPORT: 08/08/2021 12:23 ADDENDUM: Critical Value/emergent results were called by telephone at the time of interpretation on 08/08/2021 at 12:21 pm to provider Orlie Pollen, MD , who verbally acknowledged these results. Electronically Signed   By: Misty Stanley M.D.   On: 08/08/2021 12:23   Result Date: 08/08/2021 CLINICAL DATA:  Pulmonary embolism suspected. EXAM: CT ANGIOGRAPHY CHEST WITH CONTRAST TECHNIQUE: Multidetector CT imaging of the chest was performed using the standard protocol during  bolus administration of intravenous contrast. Multiplanar CT image reconstructions and MIPs were obtained to evaluate the vascular anatomy. RADIATION DOSE REDUCTION: This exam was performed according to the departmental dose-optimization program which includes automated exposure control, adjustment of the mA and/or kV according to patient size and/or use of iterative reconstruction technique. CONTRAST:  159m OMNIPAQUE IOHEXOL 350 MG/ML SOLN COMPARISON:  08/05/2021 FINDINGS: Cardiovascular: The heart size is normal. No substantial pericardial effusion. Coronary artery calcification is evident. Mild atherosclerotic calcification is noted in the wall of the thoracic aorta. Lobar and segmental pulmonary embolus is identified in the left lower lobe. Lobar nonocclusive pulmonary embolus identified in the right upper lobe extending into segmental and subsegmental branches. There is pulmonary embolus in the right middle lobe and lobar right lower lobe pulmonary arteries. Right ventricle measures 4.7 cm. Left ventricular diameter is 4.8 cm. Mediastinum/Nodes: No mediastinal lymphadenopathy. There is no hilar lymphadenopathy. The esophagus has normal imaging features. There is no axillary lymphadenopathy. Lungs/Pleura: 4 mm right lower lobe nodule on 69/6 is stable. Stable scarring medial left apex. Additional scattered tiny nodules noted left lung. No new suspicious pulmonary nodule or mass. Dependent atelectasis in the lower lobes right greater than left, is new in the interval. Tiny bilateral pleural effusions evident. Upper Abdomen: Hypervascular splenic lesion is unchanged. Musculoskeletal: No worrisome lytic or sclerotic osseous abnormality. Degenerative changes noted in both shoulders. Review of the MIP images confirms the above findings. IMPRESSION: 1. Bilateral lobar and segmental pulmonary embolus with CT evidence of borderline right heart strain (RV/LV Ratio = 0.97) consistent with at least submassive  (intermediate risk) PE. The presence of right heart strain has been associated with an increased risk of morbidity and mortality. Please refer to the "PE Focused" order set in EPIC. 2. Tiny bilateral pleural effusions with dependent atelectasis in the lower lobes right greater than left. 3. Scattered bilateral tiny pulmonary nodules measuring up to 4-5 mm. No follow-up needed if patient is low-risk (and has no known or suspected primary neoplasm). Non-contrast chest CT can be considered in 12 months if patient is high-risk. This recommendation follows the consensus statement: Guidelines for Management of Incidental Pulmonary Nodules Detected on CT Images: From the Fleischner Society 2017; Radiology 2017; 284:228-243. 4. Aortic Atherosclerosis (ICD10-I70.0). Electronically Signed: By: EMisty StanleyM.D. On: 08/08/2021 12:06   CT Angio Chest Pulmonary Embolism (PE) W or WO Contrast  Result Date: 08/05/2021 CLINICAL DATA:  Status post left lower extremity angiogram with left SFA stent EXAM: CT ANGIOGRAPHY CHEST WITH CONTRAST TECHNIQUE: Multidetector CT imaging of the chest was performed using the standard protocol during bolus administration of intravenous contrast. Multiplanar CT image reconstructions and MIPs were obtained to  evaluate the vascular anatomy. RADIATION DOSE REDUCTION: This exam was performed according to the departmental dose-optimization program which includes automated exposure control, adjustment of the mA and/or kV according to patient size and/or use of iterative reconstruction technique. CONTRAST:  22m OMNIPAQUE IOHEXOL 350 MG/ML SOLN COMPARISON:  None. FINDINGS: Cardiovascular: Satisfactory opacification of the pulmonary arteries to the segmental level. No evidence of pulmonary embolism. Normal heart size. No pericardial effusion. Thoracic aortic atherosclerosis. Mediastinum/Nodes: No enlarged mediastinal, hilar, or axillary lymph nodes. Thyroid gland, trachea, and esophagus demonstrate no  significant findings. Lungs/Pleura: Lungs are clear. No pleural effusion or pneumothorax. Upper Abdomen: Heterogeneous enhancing 4.2 cm masslike area in the spleen of indeterminate etiology. Musculoskeletal: No acute osseous abnormality. No aggressive osseous lesion. 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 Review of the MIP images confirms the above findings. IMPRESSION: 1. No pulmonary embolism. 2. No acute cardiopulmonary findings. 3. Heterogeneous enhancing 4.2 cm masslike area in the spleen of indeterminate etiology. Recommend further evaluation with an MRI of the abdomen without and with intravenous contrast for further characterization. 4. Aortic Atherosclerosis (ICD10-I70.0). Electronically Signed   By: HKathreen DevoidM.D.   On: 08/05/2021 16:17   PERIPHERAL VASCULAR CATHETERIZATION  Result Date: 08/30/2021 Images from the original result were not included. Patient name: DTamarion HaymondMRN: [id number]: 8Mar 01, 1952Sex: male 08/30/2021 Pre-operative Diagnosis: DVT with contraindication anticoagulation Post-operative diagnosis:  Same Surgeon:  BErlene QuanC. CDonzetta Matters MD Procedure Performed: 1.  Ultrasound-guided cannulation left common femoral vein 2.  Central venogram 3.  Placement of Cook Celect IVC filter 4.  Moderate sedation with fentanyl and Versed for 16 minutes Indications: 70year old male with recent history of DVT now admitted with hematuria.  He is indicated for IVC filter placement. Findings: The IVC was patent.  It measures less than 20 mm by venography.  Filter was placed just below the renal veins.  Procedure:  The patient was identified in the holding area and taken to room 8.  The patient was then placed supine on the table and prepped and draped in the usual sterile fashion.  A time out was called.  Fentanyl and Versed were administered and his vital signs  were monitored by a procedural nurse throughout the case.  Ultrasound was used to evaluate the left common femoral vein which was noted to be patent and compressible.  The right common femoral vein was not readily identifiable given significant swelling in the groin from recent surgery and what appeared to be a noncompressible vein.  We elected to place the filter in the left side.  The area was anesthetized 1% lidocaine cannulated with micropuncture needle followed by wire and the sheath.  A Bentson wire was placed.  The introducer sheath was placed and central venogram performed.  Renal veins were marked.  Filter was deployed just below these.  Catheter and wire were removed and pressure was held till hemostasis was obtained.  We turned our attention to the right above-knee amputation site where we then remove the staples.  He tolerated all this well without immediate complication. Contrast: 40 cc Brandon C. CDonzetta Matters MD [facility]Office: [phone]: [phone]  PERIPHERAL VASCULAR CATHETERIZATION  Result Date: 08/06/2021 DATE OF SERVICE: 08/06/2021  PATIENT:  DReatha Armour 71y.o. male  PRE-OPERATIVE DIAGNOSIS:  iliocaval and left lower extremity deep venous thrombosis  POST-OPERATIVE DIAGNOSIS:  Same  PROCEDURE:  1) ultrasound  guided left small saphenous vein access 2) intravascular ultrasound of inferior vena cava, left iliac veins, left femoral veins, left popliteal vein 3) mechanical thrombectomy of inferior vena cava, left iliac veins, left femoral veins, left popliteal vein. 4) left lower extremity and central venogram (65m total contrast) 5) conscious sedation (66 minutes)  SURGEON:  Surgeon(s) and Role:    * Hawken, TYevonne Aline MD - Primary  ASSISTANT: none  ANESTHESIA:   local and IV sedation  EBL: 1020m BLOOD ADMINISTERED:none  DRAINS: none  LOCAL MEDICATIONS USED:  LIDOCAINE  SPECIMEN:  none  COUNTS: confirmed correct.  TOURNIQUET:  none  PATIENT  DISPOSITION:  PACU - hemodynamically stable.  Delay start of Pharmacological VTE agent (>24hrs) due to surgical blood loss or risk of bleeding: no  INDICATION FOR PROCEDURE: DaAdolphus Hanfs a 89.o. 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 After careful discussion of risks, benefits, and alternatives the patient was offered mechanical thrombectomy. The patient  understood and wished to proceed.  OPERATIVE FINDINGS: extensive deep venous thrombosis extending from peripheral IVC to popliteal vein on the left. Almost complete resolution after Inari ClotTriever x 8 passes.  DESCRIPTION OF PROCEDURE: After identification of the patient in the pre-operative holding area, the patient was transferred to the operating room. The patient was positioned prone on the operating room table. Anesthesia was induced. The left popliteal fossa was prepped and draped in standard fashion. A surgical pause was performed confirming correct patient, procedure, and operative location.  Using intraoperative ultrasound, the small saphenous vein in the proximal left calf was accessed with micropuncture technique.  Glidewire advantage was navigated into the iliac veins on the left side.  Her access was upsized gradually to 8 FrPakistanOver the wire, a Berenstein catheter was advanced to help select the inferior vena cava.  I was not able to access this blindly.  Central venogram was performed.  This showed severe clot burden throughout the iliac bifurcation.  I was able to find a flow channel, and navigated the Glidewire through this into the right brachiocephalic vein. We upsized our access to a 1332rPakistannari purpose built sheath for mechanical thrombectomy.  The distal filter was deployed in standard fashion.  An InDevelopment worker, communityas brought onto the field and prepped in standard fashion.  I made 8 passes through the inferior vena cava into the left iliac veins, the left femoral veins And left  popliteal veins.  Copious clot returned with each pass.  At the final pass, limited clot returned. The clot triever device was removed.  Intravascular ultrasound was advanced over the wire.  Intravascular ultrasound showed near complete resolution of the thrombus burden throughout the IVC, iliac veins, femoral veins, and popliteal veins.   Ascending venograms were performed in stations from the sheath to the IVC.  This redemonstrated almost complete resolution of the thrombus burden. Satisfied end of the case here.  All endovascular equipment was removed.  A figure-of-eight stitch was placed around the access site.  The sheath was removed.  The suture was secured down.  Hemostasis was achieved.  A bandage was applied to the popliteal space.  Upon completion of the case instrument and sharps counts were confirmed correct. The patient was transferred to the  PACU in good condition. I was present for all portions of the procedure.  ThYevonne AlineHaStanford BreedMD [facility] Number: ([id number]/28/2023 4:18 PM   PERIPHERAL VASCULAR CATHETERIZATION  Result Date: 08/04/2021 Images from the original result were not included. Patient name: Kadyn Guild MRN: [id number] DOB: Jan 18, 1951 Sex: male 08/04/2021 Pre-operative Diagnosis: Left foot pain Post-operative diagnosis:  Same Surgeon:  Annamarie Major Procedure Performed:  1.  Ultrasound-guided access, right femoral artery  2.  Abdominal aortogram  3.  Left lower extremity runoff  4.  Stent, left superficial femoral artery  5.  Conscious sedation, 71 minutes  6.  Closure device, Celt Indications: This is a 71 year old gentleman recently status post right leg amputation who presented to the emergency department with swelling and pain in his left foot.  He has a known superficial femoral artery occlusion.  He was also diagnosed with a DVT.  He comes in today for arterial evaluation. Procedure:  The patient was identified in the holding  area and taken to room 8.  The patient was then placed supine on the table and prepped and draped in the usual sterile fashion.  A time out was called.  Conscious sedation was administered with the use of IV fentanyl and Versed under continuous physician and nurse monitoring.  Heart rate, blood pressure, and oxygen saturation were continuously monitored.  Total sedation time was 71 minutes.  Ultrasound was used to evaluate the right common femoral artery.  It was patent .  A digital ultrasound image was acquired.  A micropuncture needle was used to access the right common femoral artery under ultrasound guidance.  An 018 wire was advanced without resistance and a micropuncture sheath was placed.  The 018 wire was removed and a benson wire was placed.  The micropuncture sheath was exchanged for a 5 french sheath.  An omniflush catheter was advanced over the wire to the level of L-1.  An abdominal angiogram was obtained.  Next, using the omniflush catheter and a benson wire, the aortic bifurcation was crossed and the catheter was placed into theleft external iliac artery and left runoff was obtained.  Findings:  Aortogram: No significant renal artery stenosis was visualized.  The infrarenal abdominal was widely patent.  Bilateral common and external iliac arteries widely patent.  Right Lower Extremity: Not evaluated.  Left Lower Extremity: The left common femoral and profundofemoral artery are patent without significant stenosis.  There is approximately a 15 cm occlusion of the superficial femoral artery with reconstitution of the posterior tibial artery.  There is two-vessel runoff via the posterior tibial and peroneal artery. Intervention: After the above images were acquired the decision was made to proceed with intervention.  A 6 French 45 cm sheath was advanced into the left external iliac artery.  The patient was fully heparinized.  Next, using an 035 Glidewire and a quick cross catheter, subintimal  recanalization was performed.  Reentry was performed near the crossing of the femur.  A contrast injection was performed with a quick cross catheter, confirming successful crossing of the lesion.  Next, I dilated the subintimal tract with a 5 mm balloon and then placed 2 overlapping Elluvia stents.  These were a 6 x 150 and a 6 x 60.  They were postdilated with a 5 mm balloon.  Completion imaging was then performed.  This showed inline flow through the posterior tibial artery which completed the plantar arch.  There did appear to be embolization into the peroneal artery.  Next, I advanced a Glidewire down the peroneal artery without significant difficulty.  I then performed additional imaging that now showed he had three-vessel runoff.  I then closed the right groin with a  7 [place] Impression:  #1 occluded left superficial femoral artery, successfully crossed and stented using overlapping 6 mm Elluvia stents  #2 patient will be started on aspirin and Plavix.  I will also continue his IV heparin given his DVT.  He may require treatment of his DVT if he continues to have issues.  Theotis Burrow, M.D., FACS [facility] Office: [phone] Pager:  [phone]   VAS [place] IVC/ILIAC (VENOUS ONLY)  Result Date: 08/04/2021 IVC/ILIAC STUDY Patient Name:  WILDON CUEVAS  Date of Exam:   08/04/2021 Medical Rec #: [id number]         Accession #:    [id number] Date of Birth: 1950-08-12          Patient Gender: M Patient Age:   76 years Exam Location:  [facility] Procedure:      VAS [place] IVC/ILIAC (VENOUS ONLY) Referring Phys: Servando Snare --------------------------------------------------------------------------------  Indications: Extensive left lower extremity DVT Limitations: Air/bowel gas.  Comparison Study: No prior study Performing Technologist: Maudry Mayhew MHA, RDMS, RVT, RDCS  Examination Guidelines: A complete evaluation includes B-mode imaging, spectral Doppler,  color Doppler, and power Doppler as needed of all accessible portions of each vessel. Bilateral testing is considered an integral part of a complete examination. Limited examinations for reoccurring indications may be performed as noted.  IVC/Iliac Findings: +----------+------+---------------+-------------------------------------------+    IVC    Patent   Thrombus                     Comments                   +----------+------+---------------+-------------------------------------------+ IVC Prox                       Unable to visualize due to overlying bowel                                 gas                                         +----------+------+---------------+-------------------------------------------+ IVC Mid                        Unable to visualize due to overlying bowel                                 gas                                         +----------+------+---------------+-------------------------------------------+ IVC Distal            age                                                                   indeterminate                                             +----------+------+---------------+-------------------------------------------+  +-----------+---------+------------------+---------+------------------+--------+  CIV    RT-Patent   RT-Thrombus    LT-Patent   LT-Thrombus    Comments +-----------+---------+------------------+---------+------------------+--------+ Common              age indeterminate          age indeterminate          Iliac Prox                                                                +-----------+---------+------------------+---------+------------------+--------+ Common              age indeterminate          age indeterminate          Iliac                                                                     Distal                                                                     +-----------+---------+------------------+---------+------------------+--------+  +--------------+---------+----------------+---------+-----------------+--------+      EIV      RT-Patent  RT-Thrombus   LT-Patent   LT-Thrombus   Comments +--------------+---------+----------------+---------+-----------------+--------+ External Iliac               age                age indeterminate         Vein Distal             indeterminate                                     +--------------+---------+----------------+---------+-----------------+--------+   Summary: IVC/Iliac: There is evidence of age indeterminate thrombus involving the IVC. There is evidence of age indeterminate thrombus involving the right common iliac vein. There is evidence of age indeterminate thrombus involving the left common iliac vein. There is evidence of age indeterminate thrombus involving the right external iliac vein. There is evidence of age indeterminate thrombus involving the left external iliac vein.  *See table(s) above for measurements and observations.  Electronically signed by Harold Barban MD on 08/04/2021 at 8:12:10 PM.    Final    DG CHEST PORT 1 VIEW  Result Date: 08/26/2021 CLINICAL DATA:  Shortness of breath EXAM: PORTABLE CHEST 1 VIEW COMPARISON:  08/07/2021 FINDINGS: The heart size and mediastinal contours are within normal limits. Both lungs are clear. The visualized skeletal structures are unremarkable. Unchanged position of left chest wall pacemaker leads. IMPRESSION: No active disease. Electronically Signed   By: Ulyses Jarred M.D.   On: 08/26/2021 00:41   DG CHEST PORT 1 VIEW  Result Date: 08/07/2021 CLINICAL DATA:  Dyspnea. EXAM: PORTABLE CHEST 1 VIEW  COMPARISON:  01/20/2021. FINDINGS: Left chest wall pacer device noted with leads in the right atrial appendage and right ventricle. Heart size is normal. No pleural effusion or edema. No airspace opacities identified. Marked degenerative changes noted  in the right glenohumeral joint. IMPRESSION: No acute cardiopulmonary abnormalities. Electronically Signed   By: Kerby Moors M.D.   On: 08/07/2021 14:49   ECHOCARDIOGRAM COMPLETE  Result Date: 08/07/2021    ECHOCARDIOGRAM REPORT   Patient Name:   RASHAN ROUNSAVILLE Date of Exam: 08/07/2021 Medical Rec #:  [id number]        Height:       72.0 in Accession #:    [id number]       Weight:       199.1 lb Date of Birth:  February 22, 1951         BSA:          2.126 m Patient Age:    53 years         BP:           107/61 mmHg Patient Gender: M                HR:           91 bpm. Exam Location:  Inpatient Procedure: 2D Echo, Cardiac Doppler and Color Doppler Indications:    Pulmonary embolism  History:        Patient has prior history of Echocardiogram examinations, most                 recent 01/17/2021. PAD, Arrythmias:2nd degree AV block; Risk                 Factors:Diabetes and Hypertension.  Sonographer:    Merrie Roof RDCS Referring Phys: [id number] [place]  1. Septal flattening consistent with cor pulmonale . Left ventricular ejection fraction, by estimation, is 45 to 50%. The left ventricle has mildly decreased function. The left ventricle has no regional wall motion abnormalities. There is mild left ventricular hypertrophy. Left ventricular diastolic parameters are indeterminate.  2. Right ventricular systolic function is severely reduced. The right ventricular size is severely enlarged. There is moderately elevated pulmonary artery systolic pressure.  3. The mitral valve is abnormal. No evidence of mitral valve regurgitation. No evidence of mitral stenosis.  4. The aortic valve is tricuspid. There is mild calcification of the aortic valve. Aortic valve regurgitation is not visualized. Aortic valve sclerosis is present, with no evidence of aortic valve stenosis.  5. The inferior vena cava is dilated in size with >50% respiratory variability, suggesting right atrial pressure of 8 mmHg. FINDINGS  Left  Ventricle: Septal flattening consistent with cor pulmonale. Left ventricular ejection fraction, by estimation, is 45 to 50%. The left ventricle has mildly decreased function. The left ventricle has no regional wall motion abnormalities. The left ventricular internal cavity size was normal in size. There is mild left ventricular hypertrophy. Left ventricular diastolic parameters are indeterminate. Right Ventricle: RV dysfunctoin and dilatation with evidence cor pulmonale new since 01/17/21 likely secondary to PE. The right ventricular size is severely enlarged. No increase in right ventricular wall thickness. Right ventricular systolic function is severely reduced. There is moderately elevated pulmonary artery systolic pressure. The tricuspid regurgitant velocity is 2.82 m/s, and with an assumed right atrial pressure of 15 mmHg, the estimated right ventricular systolic pressure is 54.6 mmHg. Left Atrium: Left atrial size was normal in size. Right Atrium: Right atrial size was normal in size. Pericardium: There  is no evidence of pericardial effusion. Mitral Valve: The mitral valve is abnormal. There is mild thickening of the mitral valve leaflet(s). There is mild calcification of the mitral valve leaflet(s). Mild mitral annular calcification. No evidence of mitral valve regurgitation. No evidence of mitral valve stenosis. Tricuspid Valve: The tricuspid valve is normal in structure. Tricuspid valve regurgitation is mild . No evidence of tricuspid stenosis. Aortic Valve: The aortic valve is tricuspid. There is mild calcification of the aortic valve. Aortic valve regurgitation is not visualized. Aortic valve sclerosis is present, with no evidence of aortic valve stenosis. Pulmonic Valve: The pulmonic valve was normal in structure. Pulmonic valve regurgitation is not visualized. No evidence of pulmonic stenosis. Aorta: The aortic root is normal in size and structure. Venous: The inferior vena cava is dilated in size with  greater than 50% respiratory variability, suggesting right atrial pressure of 8 mmHg. IAS/Shunts: No atrial level shunt detected by color flow Doppler.  LEFT VENTRICLE PLAX 2D LVIDd:         4.50 cm   Diastology LVIDs:         3.10 cm   LV e' medial:    4.70 cm/s LV PW:         1.10 cm   LV E/e' medial:  16.5 LV IVS:        1.30 cm   LV e' lateral:   6.38 cm/s LVOT diam:     2.20 cm   LV E/e' lateral: 12.2 LV SV:         48 LV SV Index:   22 LVOT Area:     3.80 cm  RIGHT VENTRICLE             IVC RV Basal diam:  5.10 cm     IVC diam: 3.00 cm RV Mid diam:    4.50 cm RV S prime:     16.60 cm/s TAPSE (M-mode): 2.0 cm LEFT ATRIUM             Index        RIGHT ATRIUM           Index LA diam:        3.30 cm 1.55 cm/m   RA Area:     27.80 cm LA Vol (A2C):   70.8 ml 33.30 ml/m  RA Volume:   106.00 ml 49.85 ml/m LA Vol (A4C):   77.5 ml 36.45 ml/m LA Biplane Vol: 78.3 ml 36.82 ml/m  AORTIC VALVE LVOT Vmax:   82.70 cm/s LVOT Vmean:  51.600 cm/s LVOT VTI:    0.125 m  AORTA Ao Root diam: 4.20 cm MITRAL VALVE                TRICUSPID VALVE MV Area (PHT): 4.49 cm     TR Peak grad:   31.8 mmHg MV Decel Time: 169 msec     TR Vmax:        282.00 cm/s MV E velocity: 77.60 cm/s MV A velocity: 109.00 cm/s  SHUNTS MV E/A ratio:  0.71         Systemic VTI:  0.12 m                             Systemic Diam: 2.20 cm Jenkins Rouge MD Electronically signed by Jenkins Rouge MD Signature Date/Time: 08/07/2021/10:50:09 AM    Final    CT HEMATURIA WORKUP  Result Date: 08/24/2021 CLINICAL DATA:  71 year old  male with history of gross hematuria. EXAM: CT ABDOMEN AND PELVIS WITHOUT AND WITH CONTRAST TECHNIQUE: Multidetector CT imaging of the abdomen and pelvis was performed following the standard protocol before and following the bolus administration of intravenous contrast. RADIATION DOSE REDUCTION: This exam was performed according to the departmental dose-optimization program which includes automated exposure control, adjustment of the  mA and/or kV according to patient size and/or use of iterative reconstruction technique. CONTRAST:  175m OMNIPAQUE IOHEXOL 300 MG/ML  SOLN COMPARISON:  No priors. FINDINGS: Lower chest: Atherosclerotic calcifications in the descending thoracic aorta. Pacemaker leads in the right atrium and right ventricle. 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 No other suspicious hepatic lesions are noted. No intra or extrahepatic biliary ductal dilatation. Tiny calcified gallstones lying dependently in the gallbladder. Gallbladder is moderately distended. Gallbladder wall thickness is normal. No pericholecystic fluid or surrounding inflammatory changes. Pancreas: Numerous coarse calcifications are noted throughout the head and body of the pancreas, likely sequela of prior episodes of pancreatitis. No discrete pancreatic mass. No pancreatic ductal dilatation. No peripancreatic fluid collections or inflammatory changes. Spleen: Partially calcified mass in the spleen measuring 6.8 x 5.1 cm (axial image 17 of series 4), with little to no internal enhancement on postcontrast imaging. Adrenals/Urinary Tract: No calcifications are identified within the collecting system of either kidney, along the course of either ureter, or within the lumen of the urinary bladder. Subcentimeter low-attenuation lesion in the medial aspect of the lower pole of the left kidney, too small to characterize, but statistically likely to represent a tiny cyst. No aggressive appearing  renal lesions. No hydroureteronephrosis. Foley balloon catheter within the lumen of the urinary bladder, with the tip imbedded in a mass-like collection of material that is estimated to measure approximately 9.0 x 7.8 x 7.9 cm (axial image 73 of series 9 and coronal image 58 of series 12) which is generally high attenuation (65 HU), likely to represent a large bladder hematoma. Non dependently within the presumed hematoma there are several locules of gas. There is also gas non dependently in the urinary bladder, presumably iatrogenic from the indwelling Foley. Notably, along the posterior aspect of the urinary bladder there is some low-attenuation material which is mass-like in appearance on precontrast images and demonstrates heterogeneous post-contrast enhancement, best appreciated on axial images 77 of series 9 and 77 of series 4, estimated to measure approximately 3.2 x 3.2 cm. Whether this represents a bladder mass or median lobe hypertrophy from the prostate gland is uncertain. Right adrenal gland is normal. 1.7 cm left adrenal nodule (axial image 24 of series 4), indeterminate. Stomach/Bowel: The appearance of the stomach is normal. There is no pathologic dilatation of small bowel or colon. Soft tissue stranding in the presacral space where there is an enhancing soft tissue nodule slightly to the right of midline (axial image 74 of series 4) measuring 1.0 x 0.9 cm. The appendix is not confidently identified and may be surgically absent. Regardless, there are no inflammatory changes noted adjacent to the cecum to suggest the presence of an acute appendicitis at this time.  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 Filling defect in the right profunda femoral vein, right external iliac vein and right common iliac vein, concerning for deep venous  thrombosis. Extensive lymphadenopathy is noted throughout the abdomen and pelvis. Specific examples include an enlarged hepatoduodenal ligament lymph node measuring 1.4 cm in short axis (axial image 23 of series 4), enlarged para-aortic lymph nodes bilaterally measuring up to 1.8 cm in short axis (axial image 46 of series 4). Common iliac nodes bilaterally measuring up to 1.8 cm in short axis on the right (axial image 55 of series 4), external iliac lymph nodes and obturator lymph nodes bilaterally measuring up to 1.6 cm in short axis on the left, and inguinal lymph nodes bilaterally measuring up to 2.3 cm on the right (axial image 76 of series 4). Reproductive: Potential median lobe hypertrophy in the prostate gland (discussed above). Seminal vesicles are unremarkable. Other: No significant volume of ascites.  No pneumoperitoneum. Musculoskeletal: There are no aggressive appearing lytic or blastic lesions noted in the visualized portions of the skeleton. IMPRESSION: 1. Large high attenuation structure in the urinary bladder, likely a large bladder hematoma given the presence of the indwelling Foley within the lesion and some non dependent locules of gas. However, there is also an area of enhancement along the posterior wall of the urinary bladder. This is contiguous with the adjacent prostate gland, potentially asymmetric median lobe hypertrophy, however, given the patient's history of hematuria and the large bladder hematoma, the possibility of the urothelial mass should be considered. Further evaluation with cystoscopy is suggested if clinically appropriate. 2. 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 In addition, there is an indeterminate left adrenal nodule which may be metastatic, and an enhancing soft tissue nodule in the presacral space where there is also extensive presacral edema, which could represent a metastatic deposit. 3. Deep venous thrombosis on  the right involving the right profunda femoral vein, external iliac vein and common iliac vein. 4. Indeterminate partially calcified lesion in the spleen. The possibility of a metastatic lesion is not excluded. Further evaluation with nonemergent abdominal MRI with and without IV gadolinium may provide additional characterization. 5. 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 Attention at time of forthcoming abdominal MRI is recommended to provide definitive characterization. 6. 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 7. Additional incidental findings, as above. Electronically Signed   By: Vinnie Langton M.D.   On: 08/24/2021 10:20   VAS [place] LOWER EXTREMITY VENOUS (DVT)  Result Date: 08/27/2021  Lower Venous DVT Study Patient Name:  LUCERO IDE  Date of Exam:   08/26/2021 Medical Rec #: [id number]         Accession #:    [id number] Date of Birth: [birth year]          Patient Gender: M Patient Age:   31 years Exam Location:  [facility] Procedure:      VAS [place] LOWER EXTREMITY VENOUS (DVT) Referring Phys: Andres Labrum --------------------------------------------------------------------------------  Indications: PE from 08/04/21.  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 Anticoagulation: Eliquis prior to admission. Limitations: Poor ultrasound/tissue interface. Comparison Study: Previous exam on 08/04/21 was positive for extensive DVT in  BLE. LLE thrombectomy performed on 08/06/21. Performing Technologist: Rogelia Rohrer RVT, RDMS  Examination Guidelines: A complete evaluation includes B-mode imaging, spectral Doppler, color Doppler, and power Doppler as needed of all accessible portions of each vessel. Bilateral testing is considered an integral part of a complete examination. Limited examinations for reoccurring  indications may be performed as noted. The reflux portion of the exam is performed with the patient in reverse Trendelenburg.  +-------+---------------+---------+-----------+----------+-----------------+ RIGHT  CompressibilityPhasicitySpontaneityPropertiesThrombus Aging    +-------+---------------+---------+-----------+----------+-----------------+ CFV    None           No       No                   Age Indeterminate +-------+---------------+---------+-----------+----------+-----------------+ SFJ    None                                         Age Indeterminate +-------+---------------+---------+-----------+----------+-----------------+ FV ProxNone           No       No                   Age Indeterminate +-------+---------------+---------+-----------+----------+-----------------+ FV Mid None           No       No                                     +-------+---------------+---------+-----------+----------+-----------------+ PFV                   No       No                   Age Indeterminate +-------+---------------+---------+-----------+----------+-----------------+ EIV    None           No       No                   Age Indeterminate +-------+---------------+---------+-----------+----------+-----------------+ RT AKA  +---------+---------------+---------+-----------+----------+--------------+ LEFT     CompressibilityPhasicitySpontaneityPropertiesThrombus Aging +---------+---------------+---------+-----------+----------+--------------+ CFV      Full           Yes      Yes                                 +---------+---------------+---------+-----------+----------+--------------+ SFJ      Full                                                        +---------+---------------+---------+-----------+----------+--------------+ FV Prox  Full           Yes      Yes                                  +---------+---------------+---------+-----------+----------+--------------+ FV Mid   Full           Yes      Yes                                 +---------+---------------+---------+-----------+----------+--------------+  FV DistalFull           Yes      Yes                                 +---------+---------------+---------+-----------+----------+--------------+ PFV      Full                                                        +---------+---------------+---------+-----------+----------+--------------+ POP      Full           Yes      Yes                                 +---------+---------------+---------+-----------+----------+--------------+ PTV      Full                                                        +---------+---------------+---------+-----------+----------+--------------+ PERO     Full                                                        +---------+---------------+---------+-----------+----------+--------------+     Summary: RIGHT: - Findings consistent with age indeterminate deep vein thrombosis involving the right common femoral vein, SF junction, right femoral vein, right proximal profunda vein, and external iliac vein. - Findings appear essentially unchanged compared to previous examination. - Ultrasound characteristics of enlarged lymph nodes are noted in the groin.  LEFT: - There is no evidence of deep vein thrombosis in the lower extremity.  - Ultrasound characteristics of enlarged lymph nodes noted in the groin.  *See table(s) above for measurements and observations. Electronically signed by Orlie Pollen on 08/27/2021 at 5:14:07 PM.    Final    VAS [place] LOWER EXTREMITY VENOUS (DVT)  Result Date: 08/04/2021  Lower Venous DVT Study Patient Name:  BRANNEN KOPPEN  Date of Exam:   08/04/2021 Medical Rec #: [id number]         Accession #:    [id number] Date of Birth: 01-24-1951          Patient Gender: M Patient Age:   30 years Exam Location:  [facility] Procedure:      VAS [place] LOWER EXTREMITY VENOUS (DVT) Referring Phys: Servando Snare --------------------------------------------------------------------------------  Indications: Swelling, and Edema.  Risk Factors: Rt aka done 07/19/21. Performing Technologist: Archie Patten RVS  Examination Guidelines: A complete evaluation includes B-mode imaging, spectral Doppler, color Doppler, and power Doppler as needed of all accessible portions of each vessel. Bilateral testing is considered an integral part of a complete examination. Limited examinations for reoccurring indications may be performed as noted. The reflux portion of the exam is performed with the patient in reverse Trendelenburg.  +-----+---------------+---------+-----------+----------+-----------------+ RIGHTCompressibilityPhasicitySpontaneityPropertiesThrombus Aging    +-----+---------------+---------+-----------+----------+-----------------+ CFV  None           No  No                                     +-----+---------------+---------+-----------+----------+-----------------+ SFJ  None                                                           +-----+---------------+---------+-----------+----------+-----------------+ CIV                 No       No                   Age Indeterminate +-----+---------------+---------+-----------+----------+-----------------+ EIV                 No       No                   Age Indeterminate +-----+---------------+---------+-----------+----------+-----------------+   +---------+---------------+---------+-----------+----------+-----------------+ LEFT     CompressibilityPhasicitySpontaneityPropertiesThrombus Aging    +---------+---------------+---------+-----------+----------+-----------------+ CFV      None           No       No                   Age Indeterminate +---------+---------------+---------+-----------+----------+-----------------+ SFJ      None                                          Age Indeterminate +---------+---------------+---------+-----------+----------+-----------------+ FV Prox  None                                         Age Indeterminate +---------+---------------+---------+-----------+----------+-----------------+ FV Mid   None                                         Age Indeterminate +---------+---------------+---------+-----------+----------+-----------------+ FV DistalNone                                         Age Indeterminate +---------+---------------+---------+-----------+----------+-----------------+ PFV      None                                         Age Indeterminate +---------+---------------+---------+-----------+----------+-----------------+ POP      None           No       No                   Age Indeterminate +---------+---------------+---------+-----------+----------+-----------------+ PTV      Full                                                           +---------+---------------+---------+-----------+----------+-----------------+  PERO     Full                                                           +---------+---------------+---------+-----------+----------+-----------------+ Gastroc  None                                         Age Indeterminate +---------+---------------+---------+-----------+----------+-----------------+ EIV                     No       No                   Age Indeterminate +---------+---------------+---------+-----------+----------+-----------------+ CIV                     No       No                   Age Indeterminate +---------+---------------+---------+-----------+----------+-----------------+     Summary: RIGHT: - Findings consistent with age indeterminate deep vein thrombosis involving the right common femoral vein, and SF junction.  LEFT: - Findings consistent with age indeterminate deep vein thrombosis involving the  left common femoral vein, SF junction, left femoral vein, left proximal profunda vein, left popliteal vein, and left gastrocnemius veins. - No cystic structure found in the popliteal fossa. - Attempted to visualize IVC, difficult to assess due to bowel gas.  *See table(s) above for measurements and observations. Electronically signed by Harold Barban MD on 08/04/2021 at 8:16:17 PM.    Final    ECHOCARDIOGRAM LIMITED  Result Date: 08/26/2021    ECHOCARDIOGRAM LIMITED REPORT   Patient Name:   FRIEND DORFMAN Date of Exam: 08/26/2021 Medical Rec #:  [id number]        Height:       72.0 in Accession #:    [id number]       Weight:       220.0 lb Date of Birth:  Jun 22, 1950         BSA:          2.219 m Patient Age:    49 years         BP:           135/69 mmHg Patient Gender: M                HR:           85 bpm. Exam Location:  Inpatient Procedure: 2D Echo and Limited Echo Indications:    Pulmonary embolism  History:        Patient has prior history of Echocardiogram examinations, most                 recent 08/07/2021. Risk Factors:Hypertension and Diabetes.  Sonographer:    Jefferey Pica Referring Phys: [id number] [place]  1. Since TTE 08/07/21 RV function improved LV function worse with RWMA in septum and apex.  2. Global hypokinesis worse in the septum and apex . Left ventricular ejection fraction, by estimation, is 35 to 40%. The left ventricle has moderately decreased function. The left ventricle has no regional wall motion abnormalities. The left ventricular internal cavity size was moderately dilated. There is  mild left ventricular hypertrophy.  3. Right ventricular systolic function is normal. The right ventricular size is normal.  4. Left atrial size was moderately dilated.  5. The mitral valve is abnormal. No evidence of mitral valve regurgitation. No evidence of mitral stenosis.  6. The aortic valve is tricuspid. There is mild calcification of the aortic valve. Aortic valve regurgitation is  not visualized. Aortic valve sclerosis is present, with no evidence of aortic valve stenosis.  7. Aortic dilatation noted. There is mild dilatation of the ascending aorta, measuring 39 mm.  8. The inferior vena cava is normal in size with greater than 50% respiratory variability, suggesting right atrial pressure of 3 mmHg. FINDINGS  Left Ventricle: Global hypokinesis worse in the septum and apex. Left ventricular ejection fraction, by estimation, is 35 to 40%. The left ventricle has moderately decreased function. The left ventricle has no regional wall motion abnormalities. The left ventricular internal cavity size was moderately dilated. There is mild left ventricular hypertrophy. Right Ventricle: The right ventricular size is normal. No increase in right ventricular wall thickness. Right ventricular systolic function is normal. Left Atrium: Left atrial size was moderately dilated. Right Atrium: Right atrial size was normal in size. Pericardium: There is no evidence of pericardial effusion. Mitral Valve: The mitral valve is abnormal. There is mild thickening of the mitral valve leaflet(s). There is mild calcification of the mitral valve leaflet(s). Mild mitral annular calcification. No evidence of mitral valve stenosis. Tricuspid Valve: The tricuspid valve is normal in structure. Tricuspid valve regurgitation is not demonstrated. No evidence of tricuspid stenosis. Aortic Valve: The aortic valve is tricuspid. There is mild calcification of the aortic valve. Aortic valve regurgitation is not visualized. Aortic valve sclerosis is present, with no evidence of aortic valve stenosis. Pulmonic Valve: The pulmonic valve was normal in structure. Pulmonic valve regurgitation is not visualized. No evidence of pulmonic stenosis. Aorta: Aortic dilatation noted. There is mild dilatation of the ascending aorta, measuring 39 mm. Venous: The inferior vena cava is normal in size with greater than 50% respiratory variability,  suggesting right atrial pressure of 3 mmHg. IAS/Shunts: No atrial level shunt detected by color flow Doppler. Additional Comments: Since TTE 08/07/21 RV function improved LV function worse with RWMA in septum and apex. LEFT VENTRICLE PLAX 2D LVIDd:         5.90 cm LVIDs:         4.80 cm LV PW:         1.30 cm LV IVS:        1.20 cm LVOT diam:     2.10 cm LVOT Area:     3.46 cm  IVC IVC diam: 2.00 cm LEFT ATRIUM             Index        RIGHT ATRIUM           Index LA diam:        4.40 cm 1.98 cm/m   RA Area:     18.20 cm LA Vol (A2C):   80.4 ml 36.24 ml/m  RA Volume:   49.40 ml  22.27 ml/m LA Vol (A4C):   74.4 ml 33.53 ml/m LA Biplane Vol: 78.6 ml 35.43 ml/m   AORTA Ao Root diam: 4.10 cm Ao Asc diam:  3.90 cm  SHUNTS Systemic Diam: 2.10 cm Jenkins Rouge MD Electronically signed by Jenkins Rouge MD Signature Date/Time: 08/26/2021/12:02:00 PM    Final      Subjective: Patient seen examined at  bedside, resting comfortably.  Hemoglobin stable.  Discharging home today.  Foley catheter remains clear of yellow urine.  No other questions or concerns at this time.  Denies headache, no dizziness, no chest pain, no shortness of breath, no abdominal pain, no fever/chills/night sweats, no nausea/vomiting/diarrhea, no weakness, no fatigue, no paresthesias.  No acute events overnight per nurse staff.  Discharge Exam: Vitals:   09/01/21 0443 09/01/21 0724  BP: (!) 145/75 (!) 166/79  Pulse: 69 72  Resp: 18 16  Temp: 97.7 F (36.5 C) 97.6 F (36.4 C)  SpO2: 94% 98%   Vitals:   08/31/21 1558 08/31/21 1958 09/01/21 0443 09/01/21 0724  BP: (!) 162/82 133/73 (!) 145/75 (!) 166/79  Pulse: 79 77 69 72  Resp: '16 18 18 16  '$ Temp: 97.8 F (36.6 C) 97.8 F (36.6 C) 97.7 F (36.5 C) 97.6 F (36.4 C)  TempSrc: Oral Oral Oral Oral  SpO2: 95% 95% 94% 98%  Weight:      Height:        Physical Exam: GEN: NAD, alert and oriented x 3 HEENT: NCAT, PERRL, EOMI, sclera clear, MMM PULM: CTAB w/o wheezes/crackles,  normal respiratory effort, on room air CV: RRR w/o M/G/R GI: abd soft, NTND, NABS, no R/G/M MSK: no peripheral edema, right AKA noted NEURO: CN II-XII intact, no focal deficits, sensation to light touch intact PSYCH: normal mood/affect Integumentary: Stage I sacral and stage I left heel pressure injury noted, otherwise no other concerning wounds/rashes/lesions    The results of significant diagnostics from this hospitalization (including imaging, microbiology, ancillary and laboratory) are listed below for reference.     Microbiology: Recent Results (from the past 240 hour(s))  Urine Culture     Status: None   Collection Time: 08/25/21 11:31 AM   Specimen: Urine, Clean Catch  Result Value Ref Range Status   Specimen Description URINE, CLEAN CATCH  Final   Special Requests NONE  Final   Culture   Final    NO GROWTH Performed at [facility], [address] [place], [place] [address]    Report Status 08/26/2021 FINAL  Final     Labs: BNP (last 3 results) Recent Labs    01/16/21 1410 08/07/21 0854 08/26/21 0953  BNP 59.4 208.7* 106.2*   Basic Metabolic Panel: Recent Labs  Lab 08/28/21 0300 08/31/21 1948  NA 134* 133*  K 4.0 4.2  CL 96* 97*  CO2 30 28  GLUCOSE 121* 136*  BUN 8 6*  CREATININE 0.56* 0.52*  CALCIUM 8.7* 8.9   Liver Function Tests: No results for input(s): AST, ALT, ALKPHOS, BILITOT, PROT, ALBUMIN in the last 168 hours. No results for input(s): LIPASE, AMYLASE in the last 168 hours. No results for input(s): AMMONIA in the last 168 hours. CBC: Recent Labs  Lab 08/28/21 0300 08/29/21 0104 08/30/21 0452 08/31/21 0321 09/01/21 0325  WBC 22.6* 21.4* 16.0* 15.9* 15.9*  HGB 8.4* 9.0* 9.1* 9.8* 10.1*  HCT 27.5* 29.4* 28.7* 32.0* 32.1*  MCV 89.6 89.6 89.1 90.7 88.7  PLT 260 247 232 263 237   Cardiac Enzymes: No results for input(s): CKTOTAL, CKMB, CKMBINDEX, TROPONINI in the last 168 hours. BNP: Invalid input(s): POCBNP CBG: Recent Labs   Lab 08/29/21 1209  GLUCAP 133*   D-Dimer No results for input(s): DDIMER in the last 72 hours. Hgb A1c No results for input(s): HGBA1C in the last 72 hours. Lipid Profile No results for input(s): CHOL, HDL, LDLCALC, TRIG, CHOLHDL, LDLDIRECT in the last 72  hours. Thyroid function studies No results for input(s): TSH, T4TOTAL, T3FREE, THYROIDAB in the last 72 hours.  Invalid input(s): FREET3 Anemia work up No results for input(s): VITAMINB12, FOLATE, FERRITIN, TIBC, IRON, RETICCTPCT in the last 72 hours. Urinalysis    Component Value Date/Time   COLORURINE YELLOW 07/05/2021 2319   APPEARANCEUR CLOUDY (A) 07/05/2021 2319   LABSPEC 1.013 07/05/2021 2319   PHURINE 6.0 07/05/2021 2319   GLUCOSEU NEGATIVE 07/05/2021 2319   HGBUR MODERATE (A) 07/05/2021 2319   BILIRUBINUR NEGATIVE 07/05/2021 [place] 07/05/2021 2319   PROTEINUR 30 (A) 07/05/2021 2319   NITRITE NEGATIVE 07/05/2021 2319   LEUKOCYTESUR LARGE (A) 07/05/2021 2319   Sepsis Labs Invalid input(s): PROCALCITONIN,  WBC,  LACTICIDVEN Microbiology Recent Results (from the past 240 hour(s))  Urine Culture     Status: None   Collection Time: 08/25/21 11:31 AM   Specimen: Urine, Clean Catch  Result Value Ref Range Status   Specimen Description URINE, CLEAN CATCH  Final   Special Requests NONE  Final   Culture   Final    NO GROWTH Performed at [facility], [place] [address] [place], [place] [address]    Report Status 08/26/2021 FINAL  Final     Time coordinating discharge: Over 30 minutes  SIGNED:   Ronnie Doo J [place] DO  [facility] 09/01/2021, 10:17 AM

## 2021-09-08 ENCOUNTER — Other Ambulatory Visit: Payer: Self-pay | Admitting: *Deleted

## 2021-09-08 ENCOUNTER — Telehealth: Payer: Self-pay | Admitting: *Deleted

## 2021-09-08 DIAGNOSIS — C9110 Chronic lymphocytic leukemia of B-cell type not having achieved remission: Secondary | ICD-10-CM

## 2021-09-08 MED ORDER — IBRUTINIB 420 MG PO TABS: 420.0000 mg | ORAL_TABLET | Freq: Every day | ORAL | 1 refills | Status: DC

## 2021-09-08 NOTE — Telephone Encounter (Signed)
-----   Message from Wyatt Portela, MD sent at 09/08/2021  2:33 PM EDT ----- Regarding: RE: Imbruvica Yes ----- Message ----- From: Rolene Course, RN Sent: 09/08/2021   1:48 PM EDT To: Wyatt Portela, MD Subject: [facility] called & is asking about this patient's imbruvica rx.  I see he has had an extensive stay in the hospital.  Do you want to refill this rx?  Thanks, Bethena Roys

## 2021-09-08 NOTE — Telephone Encounter (Signed)
Rx for Imbruvica 420 mg sent electronically to [facility].

## 2021-09-09 ENCOUNTER — Inpatient Hospital Stay: Payer: Self-pay

## 2021-09-09 ENCOUNTER — Inpatient Hospital Stay: Payer: Self-pay | Admitting: Oncology

## 2021-09-15 ENCOUNTER — Telehealth: Payer: Self-pay | Admitting: Pharmacy Technician

## 2021-09-15 ENCOUNTER — Other Ambulatory Visit: Payer: Self-pay | Admitting: Oncology

## 2021-09-15 ENCOUNTER — Telehealth: Payer: Self-pay

## 2021-09-15 ENCOUNTER — Telehealth: Payer: Self-pay | Admitting: *Deleted

## 2021-09-15 ENCOUNTER — Other Ambulatory Visit (HOSPITAL_COMMUNITY): Payer: Self-pay

## 2021-09-15 DIAGNOSIS — C9110 Chronic lymphocytic leukemia of B-cell type not having achieved remission: Secondary | ICD-10-CM

## 2021-09-15 MED ORDER — CALQUENCE 100 MG PO TABS
100.0000 mg | ORAL_TABLET | Freq: Two times a day (BID) | ORAL | 11 refills | Status: DC
  Filled 2021-09-15: qty 60, fill #0
  Filled 2021-09-17: qty 60, 30d supply, fill #0
  Filled 2021-11-11: qty 60, 30d supply, fill #1
  Filled 2021-12-09: qty 60, 30d supply, fill #2
  Filled 2022-01-06: qty 60, 30d supply, fill #3
  Filled 2022-02-10: qty 60, 30d supply, fill #4
  Filled 2022-03-08: qty 60, 30d supply, fill #5
  Filled 2022-04-19: qty 60, 30d supply, fill #6
  Filled 2022-05-18: qty 60, 30d supply, fill #7
  Filled 2022-06-14: qty 60, 30d supply, fill #8
  Filled 2022-07-11: qty 60, 30d supply, fill #9
  Filled 2022-08-09: qty 60, 30d supply, fill #10
  Filled 2022-08-31: qty 60, 30d supply, fill #11

## 2021-09-15 MED ORDER — CALQUENCE 100 MG PO CAPS
100.0000 mg | ORAL_CAPSULE | Freq: Two times a day (BID) | ORAL | 1 refills | Status: DC
  Filled 2021-09-15: qty 60, 30d supply, fill #0

## 2021-09-15 NOTE — Telephone Encounter (Signed)
Oral Oncology Patient Advocate Encounter   Was successful in securing patient a $ 10,000 grant from [employer] to provide copayment coverage for his Calquence.  This will keep the out of pocket expense at $0.    The billing information is as follows and has been shared with [place].   Member ID: [id number] Group ID: [id number] RxBin: [id number] Dates of Eligibility: 06/17/2021 through 09/16/2022  Fund:  [facility] [place] Patient Advocate Specialist [place] Patient Advocate Team Direct Number: [phone]  Fax: [phone]

## 2021-09-15 NOTE — Telephone Encounter (Signed)
Douglas [name] wants to know if there is an alternative to [place] that will be compatible with Plavix and Eliquis.

## 2021-09-15 NOTE — Telephone Encounter (Signed)
Oral Oncology Patient Advocate Encounter  After completing a benefits investigation, prior authorization for Calquence is not required at this time through [facility].  Patient's copay is $3,315.81.     Lady Deutscher, CPhT-Adv Pharmacy Patient Advocate Specialist [place] Patient Advocate Team Direct Number: [phone]  Fax: [phone]

## 2021-09-15 NOTE — Telephone Encounter (Signed)
Pt called with c/o swelling in stump and L leg from groin to toes. He denies painl; no warmth or redness. He has been eating a higher salt diet lately. MD is aware and pt will f/u as scheduled in a few weeks. Pt has been advised to speak with his PCP about this concern as well. Pt verbalized understanding of the above.

## 2021-09-16 NOTE — Telephone Encounter (Signed)
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  Labs from 08/31/2021 assessed, no interventions needed. Prescription dose and frequency assessed.  Current medication list in Epic reviewed, DDIs with Calquence identified: none  Evaluated chart and no patient barriers to medication adherence noted.   Patient agreement for treatment documented in MD note on 09/15/2021.  Prescription has been e-scribed to the [facility] for benefits analysis and approval.  Oral Oncology Clinic will continue to follow for insurance authorization, copayment issues, initial counseling and start date.  Drema Halon, PharmD Hematology/Oncology Clinical Pharmacist [facility] [phone] 09/16/2021 9:28 AM

## 2021-09-17 ENCOUNTER — Other Ambulatory Visit (HOSPITAL_COMMUNITY): Payer: Self-pay

## 2021-09-17 NOTE — Telephone Encounter (Addendum)
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   Counseled patient on administration, dosing, side effects, monitoring, drug-food interactions, safe handling, storage, and disposal.  Patient will take Calquence '100mg'$  tablet, 1 tablet by mouth approximately 12 hours apart, with or with out food, with a glass of water.  Calquence start date: 09/22/2021  Adverse effects include but are not limited to: headache, diarrhea, fatigue, rash, muscle pain, bruising, decreased blood counts, and altered cardiac conduction.    Reviewed with patient importance of keeping a medication schedule and plan for any missed doses. No barriers to medication adherence identified.  Medication reconciliation performed and medication/allergy list updated.  Insurance authorization for [employer] has been obtained. Test claim at the pharmacy revealed copayment $0 for 1st fill of 30 days. Patient has grant funds This will ship from the [place] outpatient pharmacy on 09/20/21 to deliver to patient's home on 09/21/21.  Patient informed the pharmacy will reach out 5-7 days prior to needing next fill of Calquence to coordinate continued medication acquisition to prevent break in therapy.  All questions answered.  Mr. Lipa voiced understanding and appreciation.   Medication education handout placed in mail for patient. Patient knows to call the office with questions or concerns. Oral Chemotherapy Clinic phone number provided to patient.   Drema Halon, PharmD Hematology/Oncology Clinical Pharmacist [facility] [phone] 09/17/2021   10:48 AM

## 2021-09-20 ENCOUNTER — Other Ambulatory Visit (HOSPITAL_COMMUNITY): Payer: Self-pay

## 2021-09-24 ENCOUNTER — Telehealth: Payer: Self-pay | Admitting: *Deleted

## 2021-09-24 NOTE — Telephone Encounter (Signed)
Instructed to hold medication until next visit. Verbalized understanding

## 2021-09-24 NOTE — Telephone Encounter (Signed)
Mr Millea states he started taking Calquence on Monday, 6/12. On Tuesday developed nausea, abdominal pain, headaches and elevated BP 190/91. Symptoms were worse on Wednesday. Did not take calquence on Thursday. Today he feels a little better, slight headache and BP 168/81. Takes med for HTN.

## 2021-09-24 NOTE — Telephone Encounter (Signed)
-----   Message from Boyce Medici, [facility] sent at 09/24/2021 10:11 AM EDT ----- Regarding: Calquence AEs Good morning Dr. Alen Blew,   I received a call late yesterday from Mr. Bonebrake who started the Calquence this week. He is experiencing high blood pressure, headaches, and nausea and would like a call back on what to do.  Thanks, [employer]

## 2021-09-27 ENCOUNTER — Other Ambulatory Visit: Payer: Self-pay | Admitting: *Deleted

## 2021-09-27 DIAGNOSIS — I739 Peripheral vascular disease, unspecified: Secondary | ICD-10-CM

## 2021-09-27 DIAGNOSIS — I70221 Atherosclerosis of native arteries of extremities with rest pain, right leg: Secondary | ICD-10-CM

## 2021-09-30 ENCOUNTER — Other Ambulatory Visit (HOSPITAL_COMMUNITY): Payer: Self-pay

## 2021-10-04 ENCOUNTER — Telehealth: Payer: Self-pay | Admitting: Pharmacy Technician

## 2021-10-04 ENCOUNTER — Other Ambulatory Visit (HOSPITAL_COMMUNITY): Payer: Self-pay

## 2021-10-06 ENCOUNTER — Ambulatory Visit: Payer: Self-pay | Admitting: Vascular Surgery

## 2021-10-07 ENCOUNTER — Other Ambulatory Visit (HOSPITAL_COMMUNITY): Payer: Self-pay

## 2021-10-11 ENCOUNTER — Other Ambulatory Visit (HOSPITAL_COMMUNITY): Payer: Self-pay

## 2021-10-14 ENCOUNTER — Telehealth: Payer: Self-pay

## 2021-10-14 NOTE — Telephone Encounter (Signed)
Charri with [facility] called requesting a verbal order to extend services for twice weekly for the next 3 weeks.  Returned call, two identifiers used. Verbal order given. Confirmed understanding.

## 2021-10-19 ENCOUNTER — Ambulatory Visit (INDEPENDENT_AMBULATORY_CARE_PROVIDER_SITE_OTHER): Payer: Medicare Other

## 2021-10-19 DIAGNOSIS — I441 Atrioventricular block, second degree: Secondary | ICD-10-CM

## 2021-10-19 LAB — CUP PACEART REMOTE DEVICE CHECK
Battery Remaining Longevity: 144 mo
Battery Voltage: 3.09 V
Brady Statistic AP VP Percent: 1.51 %
Brady Statistic AP VS Percent: 0 %
Brady Statistic AS VP Percent: 94.32 %
Brady Statistic AS VS Percent: 4.17 %
Brady Statistic RA Percent Paced: 1.85 %
Brady Statistic RV Percent Paced: 95.83 %
Date Time Interrogation Session: 20230711083307
Implantable Lead Implant Date: 20221011
Implantable Lead Implant Date: 20221011
Implantable Lead Location: 753859
Implantable Lead Location: 753860
Implantable Lead Model: 3830
Implantable Lead Model: 5076
Implantable Pulse Generator Implant Date: 20221011
Lead Channel Impedance Value: 342 Ohm
Lead Channel Impedance Value: 361 Ohm
Lead Channel Impedance Value: 437 Ohm
Lead Channel Impedance Value: 475 Ohm
Lead Channel Pacing Threshold Amplitude: 0.625 V
Lead Channel Pacing Threshold Amplitude: 0.75 V
Lead Channel Pacing Threshold Pulse Width: 0.4 ms
Lead Channel Pacing Threshold Pulse Width: 0.4 ms
Lead Channel Sensing Intrinsic Amplitude: 3.75 mV
Lead Channel Sensing Intrinsic Amplitude: 3.75 mV
Lead Channel Sensing Intrinsic Amplitude: 7.25 mV
Lead Channel Sensing Intrinsic Amplitude: 7.25 mV
Lead Channel Setting Pacing Amplitude: 1.5 V
Lead Channel Setting Pacing Amplitude: 2 V
Lead Channel Setting Pacing Pulse Width: 0.4 ms
Lead Channel Setting Sensing Sensitivity: 0.9 mV

## 2021-10-26 ENCOUNTER — Other Ambulatory Visit: Payer: Self-pay

## 2021-10-26 ENCOUNTER — Inpatient Hospital Stay: Payer: Medicare Other | Attending: Oncology

## 2021-10-26 ENCOUNTER — Inpatient Hospital Stay (HOSPITAL_BASED_OUTPATIENT_CLINIC_OR_DEPARTMENT_OTHER): Payer: Medicare Other | Admitting: Oncology

## 2021-10-26 VITALS — BP 144/66 | HR 84 | Temp 98.1°F | Resp 17

## 2021-10-26 DIAGNOSIS — C9110 Chronic lymphocytic leukemia of B-cell type not having achieved remission: Secondary | ICD-10-CM

## 2021-10-26 DIAGNOSIS — I498 Other specified cardiac arrhythmias: Secondary | ICD-10-CM | POA: Insufficient documentation

## 2021-10-26 DIAGNOSIS — Z89611 Acquired absence of right leg above knee: Secondary | ICD-10-CM | POA: Insufficient documentation

## 2021-10-26 DIAGNOSIS — Z79899 Other long term (current) drug therapy: Secondary | ICD-10-CM | POA: Insufficient documentation

## 2021-10-26 LAB — CMP (CANCER CENTER ONLY)
ALT: 16 U/L (ref 0–44)
AST: 13 U/L — ABNORMAL LOW (ref 15–41)
Albumin: 4.2 g/dL (ref 3.5–5.0)
Alkaline Phosphatase: 83 U/L (ref 38–126)
Anion gap: 9 (ref 5–15)
BUN: 13 mg/dL (ref 8–23)
CO2: 29 mmol/L (ref 22–32)
Calcium: 9.5 mg/dL (ref 8.9–10.3)
Chloride: 100 mmol/L (ref 98–111)
Creatinine: 0.57 mg/dL — ABNORMAL LOW (ref 0.61–1.24)
GFR, Estimated: >60 mL/min (ref >60)
Glucose, Bld: 144 mg/dL — ABNORMAL HIGH (ref 70–99)
Potassium: 3.4 mmol/L — ABNORMAL LOW (ref 3.5–5.1)
Sodium: 138 mmol/L (ref 135–145)
Total Bilirubin: 0.3 mg/dL (ref 0.3–1.2)
Total Protein: 6.6 g/dL (ref 6.5–8.1)

## 2021-10-26 LAB — CBC WITH DIFFERENTIAL (CANCER CENTER ONLY)
Abs Immature Granulocytes: 0.06 10*3/uL (ref 0.00–0.07)
Basophils Absolute: 0.1 10*3/uL (ref 0.0–0.1)
Basophils Relative: 1 %
Eosinophils Absolute: 0.2 10*3/uL (ref 0.0–0.5)
Eosinophils Relative: 1 %
HCT: 42.7 % (ref 39.0–52.0)
Hemoglobin: 13.6 g/dL (ref 13.0–17.0)
Immature Granulocytes: 0 %
Lymphocytes Relative: 42 %
Lymphs Abs: 7.7 10*3/uL — ABNORMAL HIGH (ref 0.7–4.0)
MCH: 26.5 pg (ref 26.0–34.0)
MCHC: 31.9 g/dL (ref 30.0–36.0)
MCV: 83.1 fL (ref 80.0–100.0)
Monocytes Absolute: 3.9 10*3/uL — ABNORMAL HIGH (ref 0.1–1.0)
Monocytes Relative: 22 %
Neutro Abs: 6 10*3/uL (ref 1.7–7.7)
Neutrophils Relative %: 34 %
Platelet Count: 270 10*3/uL (ref 150–400)
RBC: 5.14 MIL/uL (ref 4.22–5.81)
RDW: 17.6 % — ABNORMAL HIGH (ref 11.5–15.5)
Smear Review: NORMAL
WBC Count: 17.9 10*3/uL — ABNORMAL HIGH (ref 4.0–10.5)
nRBC: 0 % (ref 0.0–0.2)

## 2021-10-26 NOTE — Progress Notes (Signed)
Hematology and Oncology Follow Up Visit  Douglas [name] [id number] Aug 31, 1950 71 y.o. 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   Prior therapy: Ibrutinib for 20 mg started in August 2021.  Therapy discontinued because of drug interaction  Current therapy: Calquence 100 mg twice a day started in May 2023.  Therapy currently on hold.  Interim History: Douglas [name] is here for a follow-up visit.  Since last visit, he was hospitalized in May 2023 for hematuria and subsequently developed right limb ischemia and underwent AKA by vascular surgery.  Since his discharge, he has improved his performance status and activity level and was started on Calquence instead of improvement but did develop few side effects.  He reported mild increase in his blood pressure as well as occasional headaches he stopped taking it recently.  He reports his headaches has improved and his blood pressure has improved as well.          Medications: Updated on review Current Outpatient Medications  Medication Sig Dispense Refill   Acalabrutinib Maleate (CALQUENCE) 100 MG TABS Take 100 mg by mouth 2 (two) times daily. 60 tablet 11   acetaminophen (TYLENOL) 500 MG tablet Take 500 mg by mouth at bedtime.     albuterol (PROVENTIL) (2.5 MG/3ML) 0.083% nebulizer solution Take 2.5 mg by nebulization every 6 (six) hours as needed for wheezing or shortness of breath.     amiodarone (PACERONE) 200 MG tablet Take 1 tablet (200 mg total) by mouth daily. 30 tablet 2   ascorbic acid (VITAMIN C) 1000 MG tablet Take 1 tablet (1,000 mg total) by mouth daily. 30 tablet 0   docusate sodium (COLACE) 100 MG capsule Take 1 capsule (100 mg total) by mouth 2 (two) times daily. (Patient taking differently: Take 100 mg by mouth daily as needed for mild  constipation.) 10 capsule 0   finasteride (PROSCAR) 5 MG tablet Take 1 tablet (5 mg total) by mouth daily. 30 tablet 0   melatonin 5 MG TABS Take 1 tablet (5 mg total) by mouth at bedtime. 30 tablet 0   methocarbamol (ROBAXIN) 500 MG tablet Take 1 tablet (500 mg total) by mouth 3 (three) times daily. (Patient taking differently: Take 500 mg by mouth at bedtime as needed for muscle spasms.) 90 tablet 0   oxymetazoline (AFRIN) 0.05 % nasal spray Place 1 spray into both nostrils 2 (two) times daily as needed for congestion.     Oxymetazoline HCl (MUCINEX NASAL SPRAY FULL FORCE NA) Place 1 spray into the nose 2 (two) times daily as needed (congestion).     rosuvastatin (CRESTOR) 20 MG tablet Take 1 tablet (20 mg total) by mouth daily. 30 tablet 5   No current facility-administered medications for this visit.     Allergies: No Known Allergies      Physical Exam:     Blood pressure (!) 144/66, pulse 84, temperature 98.1 F (36.7 C), temperature source Temporal, resp. rate 17, SpO2 93 %.    ECOG: 0    General appearance: Comfortable appearing without any discomfort Head: Normocephalic without any trauma Oropharynx: Mucous membranes are moist and pink without any thrush or ulcers. Eyes: Pupils are equal and round reactive to light. Lymph nodes: No cervical, supraclavicular, inguinal or axillary lymphadenopathy.   Heart:regular rate and rhythm.  S1 and S2 without leg edema.  Lung: Clear without any rhonchi or wheezes.  No dullness to percussion. Abdomin: Soft, nontender, nondistended with good bowel sounds.  No hepatosplenomegaly. Musculoskeletal: No joint deformity or effusion.  Full range of motion noted. Neurological: No deficits noted on motor, sensory and deep tendon reflex exam. Skin: No petechial rash or dryness.  Appeared moist.             Lab Results: Lab Results  Component Value Date   WBC 17.9 (H) 10/26/2021   HGB 13.6 10/26/2021   HCT 42.7 10/26/2021    MCV 83.1 10/26/2021   PLT 270 10/26/2021     Chemistry      Component Value Date/Time   NA 133 (L) 08/31/2021 1948   K 4.2 08/31/2021 1948   CL 97 (L) 08/31/2021 1948   CO2 28 08/31/2021 1948   BUN 6 (L) 08/31/2021 1948   CREATININE 0.52 (L) 08/31/2021 1948   CREATININE 0.94 05/04/2021 0752      Component Value Date/Time   CALCIUM 8.9 08/31/2021 1948   ALKPHOS 214 (H) 07/14/2021 0511   AST 28 07/14/2021 0511   AST 18 05/04/2021 0752   ALT 89 (H) 07/14/2021 0511   ALT 28 05/04/2021 0752   BILITOT 0.4 07/14/2021 0511   BILITOT 0.6 05/04/2021 0752        Impression and Plan:   71 year old with:  1.  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   The natural course of this disease was reviewed and treatment choices were discussed.  Risks and benefits of restarting Calquence were reiterated.  Sweat cell count is elevated and has developed some cervical adenopathy that is very small at this time.  After discussion he is agreeable to restart Calquence and consider dose reduction if he has experienced similar side effects.    2.  Status post right AKA: He has recovered from surgery without any consequences at this time.  3.  Cardiac arrhythmia: He is currently on amiodarone.   4.  Follow-up: In 3 months for repeat follow-up.   30  minutes were dedicated to this visit.  Time spent on reviewing laboratory data, disease status update, treatment choices and addressing complication related to his therapy.    [name], MD 7/18/20232:30 PM

## 2021-10-27 ENCOUNTER — Encounter: Payer: Self-pay | Admitting: Vascular Surgery

## 2021-10-27 ENCOUNTER — Ambulatory Visit (INDEPENDENT_AMBULATORY_CARE_PROVIDER_SITE_OTHER)
Admission: RE | Admit: 2021-10-27 | Discharge: 2021-10-27 | Disposition: A | Discharge status: Home / Self Care | Payer: Medicare Other | Source: Ambulatory Visit | Attending: Vascular Surgery | Admitting: Vascular Surgery

## 2021-10-27 ENCOUNTER — Ambulatory Visit (HOSPITAL_COMMUNITY)
Admission: RE | Admit: 2021-10-27 | Discharge: 2021-10-27 | Disposition: A | Discharge status: Home / Self Care | Payer: Medicare Other | Source: Ambulatory Visit | Attending: Vascular Surgery | Admitting: Vascular Surgery

## 2021-10-27 ENCOUNTER — Other Ambulatory Visit (HOSPITAL_COMMUNITY): Payer: Self-pay

## 2021-10-27 ENCOUNTER — Ambulatory Visit (INDEPENDENT_AMBULATORY_CARE_PROVIDER_SITE_OTHER): Payer: Medicare Other | Admitting: Vascular Surgery

## 2021-10-27 VITALS — BP 143/68 | HR 80 | Temp 98.6°F | Resp 20 | Ht 72.0 in | Wt 220.0 lb

## 2021-10-27 DIAGNOSIS — I70221 Atherosclerosis of native arteries of extremities with rest pain, right leg: Secondary | ICD-10-CM | POA: Diagnosis present

## 2021-10-27 DIAGNOSIS — I82492 Acute embolism and thrombosis of other specified deep vein of left lower extremity: Secondary | ICD-10-CM

## 2021-10-27 DIAGNOSIS — I739 Peripheral vascular disease, unspecified: Secondary | ICD-10-CM | POA: Insufficient documentation

## 2021-10-27 NOTE — Progress Notes (Addendum)
HPI:  Douglas [name] is a 71 y.o. male recently had attempted right lower extremity revascularization complicated by need for above-knee amputation and then was found to have left lower extremity pain required left SFA stenting and subsequent mechanical thrombectomy of extensive DVT.  Patient then had hematuria and IVC filter was placed.  He now follows up from all of this.  Past Medical History:  Diagnosis Date   BPH (benign prostatic hyperplasia)    CLL (chronic lymphocytic leukemia) (HCC)    Hypertension    Pacemaker    Medtronic Device   Pulmonary embolism (HCC)    PVD (peripheral vascular disease) (HCC)    s/p stent, thrombectomy   Family History  Problem Relation Age of Onset   Lung cancer Mother    Coronary artery disease Father    Past Surgical History:  Procedure Laterality Date   ABDOMINAL AORTOGRAM W/LOWER EXTREMITY Bilateral 06/21/2021   Procedure: ABDOMINAL AORTOGRAM W/LOWER EXTREMITY;  Surgeon: Waynetta Sandy, MD;  Location: [place] [facility];  Service: Cardiovascular;  Laterality: Bilateral;   ABDOMINAL AORTOGRAM W/LOWER EXTREMITY N/A 08/04/2021   Procedure: ABDOMINAL AORTOGRAM W/LOWER EXTREMITY;  Surgeon: Serafina Mitchell, MD;  Location: [place] [facility];  Service: Cardiovascular;  Laterality: N/A;   AMPUTATION Right 06/25/2021   Procedure: RIGHT BELOW KNEE AMPUTATION;  Surgeon: Waynetta Sandy, MD;  Location: [place];  Service: Vascular;  Laterality: Right;   AMPUTATION Right 07/19/2021   Procedure: AMPUTATION ABOVE KNEE;  Surgeon: Angelia Mould, MD;  Location: [place];  Service: Vascular;  Laterality: Right;   FEMORAL-POPLITEAL BYPASS GRAFT Right 06/22/2021   Procedure: RIGHT FEMORAL-POPLITEAL BYPASS WITH VEIN, RIGHT POPLITEAL THROMBECTOMY;  Surgeon: Waynetta Sandy, MD;  Location: [place];  Service: Vascular;  Laterality: Right;   IVC FILTER INSERTION N/A 08/30/2021   Procedure: IVC FILTER INSERTION;  Surgeon: Waynetta Sandy, MD;  Location: [place] [facility];  Service: Cardiovascular;  Laterality: N/A;   LEFT HEART CATH AND CORONARY ANGIOGRAPHY N/A 01/18/2021   Procedure: LEFT HEART CATH AND CORONARY ANGIOGRAPHY;  Surgeon: Burnell Blanks, MD;  Location: [place] [facility];  Service: Cardiovascular;  Laterality: N/A;   LOWER EXTREMITY VENOGRAPHY Left 08/06/2021   Procedure: LOWER EXTREMITY VENOGRAPHY;  Surgeon: Cherre Robins, MD;  Location: [place] [facility];  Service: Cardiovascular;  Laterality: Left;   PACEMAKER IMPLANT N/A 01/19/2021   Procedure: PACEMAKER IMPLANT;  Surgeon: Evans Lance, MD;  Location: [place] [facility];  Service: Cardiovascular;  Laterality: N/A;   PERIPHERAL VASCULAR INTERVENTION  08/04/2021   Procedure: PERIPHERAL VASCULAR INTERVENTION;  Surgeon: Serafina Mitchell, MD;  Location: [place] [facility];  Service: Cardiovascular;;   PERIPHERAL VASCULAR THROMBECTOMY  08/06/2021   Procedure: PERIPHERAL VASCULAR THROMBECTOMY;  Surgeon: Cherre Robins, MD;  Location: [place] [facility];  Service: Cardiovascular;;  Left Popliteal Vein    Short Social History:  Social History   Tobacco Use   Smoking status: Former    Packs/day: 0.50    Years: 10.00    Total pack years: 5.00    Types: Cigarettes   Smokeless tobacco: Never  Substance Use Topics   Alcohol use: Not Currently    No Known Allergies  Current Outpatient Medications  Medication Sig Dispense Refill   Acalabrutinib Maleate (CALQUENCE) 100 MG TABS Take 100 mg by mouth 2 (two) times daily. 60 tablet 11   acetaminophen (TYLENOL) 500 MG tablet Take 500 mg by mouth at bedtime.  albuterol (PROVENTIL) (2.5 MG/3ML) 0.083% nebulizer solution Take 2.5 mg by nebulization every 6 (six) hours as needed for wheezing or shortness of breath.     amiodarone (PACERONE) 200 MG tablet Take 1 tablet (200 mg total) by mouth daily. 30 tablet 2   ascorbic acid (VITAMIN C) 1000 MG tablet Take 1 tablet (1,000 mg total) by mouth  daily. 30 tablet 0   docusate sodium (COLACE) 100 MG capsule Take 1 capsule (100 mg total) by mouth 2 (two) times daily. (Patient taking differently: Take 100 mg by mouth daily as needed for mild constipation.) 10 capsule 0   finasteride (PROSCAR) 5 MG tablet Take 1 tablet (5 mg total) by mouth daily. 30 tablet 0   melatonin 5 MG TABS Take 1 tablet (5 mg total) by mouth at bedtime. 30 tablet 0   methocarbamol (ROBAXIN) 500 MG tablet Take 1 tablet (500 mg total) by mouth 3 (three) times daily. (Patient taking differently: Take 500 mg by mouth at bedtime as needed for muscle spasms.) 90 tablet 0   oxymetazoline (AFRIN) 0.05 % nasal spray Place 1 spray into both nostrils 2 (two) times daily as needed for congestion.     Oxymetazoline HCl (MUCINEX NASAL SPRAY FULL FORCE NA) Place 1 spray into the nose 2 (two) times daily as needed (congestion).     rosuvastatin (CRESTOR) 20 MG tablet Take 1 tablet (20 mg total) by mouth daily. 30 tablet 5   No current facility-administered medications for this visit.    Review of Systems  Constitutional:  Constitutional negative. HENT: HENT negative.  Eyes: Eyes negative.  Respiratory: Respiratory negative.  Cardiovascular: Cardiovascular negative.  GI: Gastrointestinal negative.  GU: Positive for hematuria.       Indwelling foley Musculoskeletal: Musculoskeletal negative.  Skin: Skin negative.  Neurological: Neurological negative. Hematologic: Hematologic/lymphatic negative.  Psychiatric: Psychiatric negative.        Objective:  Objective  Vitals:   10/27/21 1200  BP: (!) 143/68  Pulse: 80  Resp: 20  Temp: 98.6 F (37 C)  SpO2: 97%     Physical Exam HENT:     Head: Normocephalic.     Nose: Nose normal.  Eyes:     Pupils: Pupils are equal, round, and reactive to light.  Cardiovascular:     Rate and Rhythm: Normal rate.     Pulses:          Dorsalis pedis pulses are 2+ on the left side.  Pulmonary:     Effort: Pulmonary effort is  normal.  Abdominal:     General: Abdomen is flat.     Palpations: Abdomen is soft.  Musculoskeletal:     Comments: Right aka healing well  Skin:    General: Skin is warm.     Capillary Refill: Capillary refill takes less than 2 seconds.  Neurological:     Mental Status: He is alert.  Psychiatric:        Mood and Affect: Mood normal.        Behavior: Behavior normal.        Thought Content: Thought content normal.        Judgment: Judgment normal.     Data: ABI Findings:  +--------+------------------+-----+--------+--------+  Right   Rt Pressure (mmHg)IndexWaveformComment   +--------+------------------+-----+--------+--------+  [id number]                                      +--------+------------------+-----+--------+--------+   +---------+------------------+-----+---------+-------+  Left     Lt Pressure (mmHg)IndexWaveform Comment  +---------+------------------+-----+---------+-------+  Brachial 164                                      +---------+------------------+-----+---------+-------+  PTA      191               1.12 triphasic         +---------+------------------+-----+---------+-------+  DP       190               1.11 triphasic         +---------+------------------+-----+---------+-------+  Great Toe99                0.58 Abnormal          +---------+------------------+-----+---------+-------+   +-------+-----------+-----------+------------+------------+  ABI/TBIToday's ABIToday's TBIPrevious ABIPrevious TBI  +-------+-----------+-----------+------------+------------+  Right  AKA                   0.33        0             +-------+-----------+-----------+------------+------------+  Left   1.12       0.58       0.74        0.33          +-------+-----------+-----------+------------+------------+      Summary:  Left: Resting left ankle-brachial index is within normal range. No  evidence of  significant left lower extremity arterial disease. The left  toe-brachial index is abnormal.   LEFT      PSV cm/sRatioStenosisWaveform Comments  +----------+--------+-----+--------+---------+--------+  CFA Prox  142                  triphasic          +----------+--------+-----+--------+---------+--------+  CFA Distal101                  triphasic          +----------+--------+-----+--------+---------+--------+  DFA       108                  biphasic           +----------+--------+-----+--------+---------+--------+  SFA Prox  67                   triphasic          +----------+--------+-----+--------+---------+--------+  SFA Mid   111                  biphasic           +----------+--------+-----+--------+---------+--------+  SFA Distal                              stent     +----------+--------+-----+--------+---------+--------+  POP Mid   104                  triphasic          +----------+--------+-----+--------+---------+--------+      Left Stent(s):  +---------------+--++---------++  Prox to Stent  89           +---------------+--++---------++  Proximal Stent 86biphasic   +---------------+--++---------++  Mid Stent      88triphasic  +---------------+--++---------++  Distal Stent   97triphasic  +---------------+--++---------++  Distal to Stent98triphasic  +---------------+--++---------++     Summary:  Left: Patent stent with no  evidence for restenosis.      Assessment/Plan:    71 year old male previous right lower extremity above-knee amputation now with status post thrombectomy left lower extremity DVT and left SFA stenting.  There is a palpable left DP pulse left lower extremity swelling has resolved.  He does have a filter in place which we have considered indefinite given all of his issues going on.  He is on aspirin Plavix no anticoagulation.  At this time he is deferring  prosthetic.  He will follow-up in 6 months with repeat studies at which time we can consider filter removal if necessary and also consider referral to [facility] clinic.  He continues to require hospital bed due to his significant risk of aspiration requiring elevation of his head more than 30 degrees when recumbent.  He also requires positioning for bedpan usage given his left lower extremity weakness and right AKA but also requires hospital bed.  Patient requires wheelchair for mobilization given right lower extremity amputation that prevents him from walking and weakness that has prevented fitting for prosthetic.     Waynetta Sandy MD [facility]

## 2021-10-29 ENCOUNTER — Telehealth: Payer: Self-pay | Admitting: Oncology

## 2021-10-29 NOTE — Telephone Encounter (Signed)
Scheduled per 07/18 los, patient has been called and notified. 

## 2021-11-03 ENCOUNTER — Other Ambulatory Visit: Payer: Self-pay

## 2021-11-03 DIAGNOSIS — I739 Peripheral vascular disease, unspecified: Secondary | ICD-10-CM

## 2021-11-11 ENCOUNTER — Other Ambulatory Visit (HOSPITAL_COMMUNITY): Payer: Self-pay

## 2021-11-11 NOTE — Progress Notes (Signed)
Remote pacemaker transmission.   

## 2021-11-18 ENCOUNTER — Telehealth: Payer: Self-pay

## 2021-11-18 ENCOUNTER — Other Ambulatory Visit (HOSPITAL_COMMUNITY): Payer: Self-pay

## 2021-11-18 NOTE — Telephone Encounter (Signed)
Pete PT with [facility] called stating that the pt originally deferred a prosthetic, but is now ready for one. He also wants to continue PT during the adjustment period.  Reviewed pt's chart, returned call, no answer, lf vm. Informed him that a prescription was being sent to [facility] for the prosthetic since the pt has been seen in the office within the past 6 months.

## 2021-11-19 ENCOUNTER — Other Ambulatory Visit (HOSPITAL_COMMUNITY): Payer: Self-pay

## 2021-11-29 ENCOUNTER — Other Ambulatory Visit (HOSPITAL_COMMUNITY): Payer: Self-pay

## 2021-11-30 ENCOUNTER — Other Ambulatory Visit (HOSPITAL_COMMUNITY): Payer: Self-pay

## 2021-12-09 ENCOUNTER — Other Ambulatory Visit (HOSPITAL_COMMUNITY): Payer: Self-pay

## 2021-12-16 ENCOUNTER — Other Ambulatory Visit (HOSPITAL_COMMUNITY): Payer: Self-pay

## 2021-12-20 NOTE — Progress Notes (Signed)
Prescription for prosthetic sent to [facility], fax verified successful.

## 2021-12-20 NOTE — Progress Notes (Signed)
Demographics and office notes faxed to [facility] per request, verified successful.

## 2022-01-05 ENCOUNTER — Other Ambulatory Visit (HOSPITAL_COMMUNITY): Payer: Self-pay

## 2022-01-06 ENCOUNTER — Other Ambulatory Visit (HOSPITAL_COMMUNITY): Payer: Self-pay

## 2022-01-07 ENCOUNTER — Other Ambulatory Visit (HOSPITAL_COMMUNITY): Payer: Self-pay

## 2022-01-13 ENCOUNTER — Other Ambulatory Visit (HOSPITAL_COMMUNITY): Payer: Self-pay

## 2022-01-14 ENCOUNTER — Other Ambulatory Visit (HOSPITAL_COMMUNITY): Payer: Self-pay

## 2022-01-18 ENCOUNTER — Ambulatory Visit (INDEPENDENT_AMBULATORY_CARE_PROVIDER_SITE_OTHER): Payer: Medicare Other

## 2022-01-18 DIAGNOSIS — I441 Atrioventricular block, second degree: Secondary | ICD-10-CM | POA: Diagnosis not present

## 2022-01-18 LAB — CUP PACEART REMOTE DEVICE CHECK
Battery Remaining Longevity: 145 mo
Battery Voltage: 3.06 V
Brady Statistic AP VP Percent: 4.62 %
Brady Statistic AP VS Percent: 0 %
Brady Statistic AS VP Percent: 93.84 %
Brady Statistic AS VS Percent: 1.54 %
Brady Statistic RA Percent Paced: 5.01 %
Brady Statistic RV Percent Paced: 98.46 %
Date Time Interrogation Session: 20231009185214
Implantable Lead Implant Date: 20221011
Implantable Lead Implant Date: 20221011
Implantable Lead Location: 753859
Implantable Lead Location: 753860
Implantable Lead Model: 3830
Implantable Lead Model: 5076
Implantable Pulse Generator Implant Date: 20221011
Lead Channel Impedance Value: 342 Ohm
Lead Channel Impedance Value: 380 Ohm
Lead Channel Impedance Value: 418 Ohm
Lead Channel Impedance Value: 570 Ohm
Lead Channel Pacing Threshold Amplitude: 0.625 V
Lead Channel Pacing Threshold Amplitude: 0.75 V
Lead Channel Pacing Threshold Pulse Width: 0.4 ms
Lead Channel Pacing Threshold Pulse Width: 0.4 ms
Lead Channel Sensing Intrinsic Amplitude: 3.625 mV
Lead Channel Sensing Intrinsic Amplitude: 3.625 mV
Lead Channel Sensing Intrinsic Amplitude: 7.75 mV
Lead Channel Sensing Intrinsic Amplitude: 7.75 mV
Lead Channel Setting Pacing Amplitude: 1.5 V
Lead Channel Setting Pacing Amplitude: 2 V
Lead Channel Setting Pacing Pulse Width: 0.4 ms
Lead Channel Setting Sensing Sensitivity: 0.9 mV

## 2022-01-24 ENCOUNTER — Emergency Department (HOSPITAL_COMMUNITY)
Admission: EM | Admit: 2022-01-24 | Discharge: 2022-01-24 | Disposition: A | Discharge status: Home / Self Care | Payer: Medicare Other | Attending: Emergency Medicine | Admitting: Emergency Medicine

## 2022-01-24 ENCOUNTER — Other Ambulatory Visit (HOSPITAL_COMMUNITY): Payer: Self-pay

## 2022-01-24 ENCOUNTER — Encounter (HOSPITAL_COMMUNITY): Payer: Self-pay | Admitting: Emergency Medicine

## 2022-01-24 ENCOUNTER — Other Ambulatory Visit: Payer: Self-pay

## 2022-01-24 DIAGNOSIS — Z79899 Other long term (current) drug therapy: Secondary | ICD-10-CM | POA: Insufficient documentation

## 2022-01-24 DIAGNOSIS — B379 Candidiasis, unspecified: Secondary | ICD-10-CM | POA: Diagnosis not present

## 2022-01-24 DIAGNOSIS — N390 Urinary tract infection, site not specified: Secondary | ICD-10-CM | POA: Insufficient documentation

## 2022-01-24 DIAGNOSIS — Y846 Urinary catheterization as the cause of abnormal reaction of the patient, or of later complication, without mention of misadventure at the time of the procedure: Secondary | ICD-10-CM | POA: Diagnosis not present

## 2022-01-24 DIAGNOSIS — T83091A Other mechanical complication of indwelling urethral catheter, initial encounter: Secondary | ICD-10-CM | POA: Diagnosis present

## 2022-01-24 LAB — URINALYSIS, ROUTINE W REFLEX MICROSCOPIC
Bilirubin Urine: NEGATIVE
Glucose, UA: NEGATIVE mg/dL
Ketones, ur: NEGATIVE mg/dL
Nitrite: NEGATIVE
Protein, ur: 100 mg/dL — AB
Specific Gravity, Urine: 1.017 (ref 1.005–1.030)
WBC, UA: >50 WBC/hpf — ABNORMAL HIGH (ref 0–5)
pH: 6 (ref 5.0–8.0)

## 2022-01-24 MED ORDER — FLUCONAZOLE 150 MG PO TABS: 150.0000 mg | ORAL_TABLET | Freq: Every day | ORAL | 0 refills | Status: DC

## 2022-01-24 MED ORDER — FLUCONAZOLE 150 MG PO TABS
150.0000 mg | ORAL_TABLET | Freq: Once | ORAL | Status: AC
  Administered 2022-01-24: 150 mg via ORAL
  Filled 2022-01-24: qty 1

## 2022-01-24 MED ORDER — CEPHALEXIN 500 MG PO CAPS: 500.0000 mg | ORAL_CAPSULE | Freq: Four times a day (QID) | ORAL | 0 refills | Status: DC

## 2022-01-24 NOTE — ED Notes (Signed)
PATIENT WAS CALLED X5 NO ANSWER

## 2022-01-24 NOTE — ED Provider Triage Note (Signed)
Emergency Medicine Provider Triage Evaluation Note  Douglas [name] , a 71 y.o. male  was evaluated in triage.  Pt complains of 12 hours of no drainage into his Foley bag.  Patient states he is leaking urine around his Foley catheter.  Initially had lower abdominal pain and severe burning in the penis with leakage of urine around the catheter, felt like his body was straining to pass urine due to obstruction of catheter.  Now feeling much better though he did completely soak his bed from leakage from his penis around his catheter.  Per patient requires coud catheter, states is a 60 [place].  Unfortunately no size can be determined based on visualization of the catheter..  Review of Systems  Positive: As above Negative: Fever chills nausea vomiting  Physical Exam  BP (!) 168/83 (BP Location: Right Arm)   Pulse (!) 113   Temp 98.2 F (36.8 C) (Oral)   Resp 18   SpO2 94%  Gen:   Awake, no distress   Resp:  Normal effort  MSK:   Moves extremities without difficulty  Other:  No acute abnormality noted on genital exam, chaperone with English as a second language teacher.  Urinary catheter in place, no urine in the bag though patient does have completely saturated briefs in place.  Medical Decision Making  Medically screening exam initiated at 6:39 AM.  Appropriate orders placed.  [name] was informed that the remainder of the evaluation will be completed by another provider, this initial triage assessment does not replace that evaluation, and the importance of remaining in the ED until their evaluation is complete.  This chart was dictated using voice recognition software, Dragon. Despite the best efforts of this provider to proofread and correct errors, errors may still occur which can change documentation meaning.    [name], PA-C 01/24/22 [phone]

## 2022-01-24 NOTE — ED Triage Notes (Signed)
Per EMS, pt from home, c/o foley cath since April, about 2 days ago noticed white "stuff" and about two hours he started to urinate around the catheter.  Reports burning w/ urination.  148/82 104p 98% RA RR 16 R AKA  Pt reports he usually has a coude 18g foley placed.  Pt has completely soaked his brief from the leakage of urine.

## 2022-01-24 NOTE — ED Notes (Signed)
PATIENT WAS CALLED X3 NO ASNWER

## 2022-01-24 NOTE — Discharge Instructions (Signed)
Please call urology today, to discuss arranging a visit due to concerns for recurrent yeast infection/yeast balls.  Call your oncologist in regards to your chemo medication with fluconazole.  Please return to the ER for any further blockages of your catheter, fever, chills, abdominal pain.  Remember to save 1 dose of fluconazole, for the end of your Keflex prescription.

## 2022-01-24 NOTE — ED Provider Notes (Signed)
[facility] EMERGENCY DEPARTMENT Provider Note   CSN: [id number] Arrival date & time: 01/24/22  0601     History  Chief Complaint  Patient presents with   foley obstructtion    Douglas [name] is a 71 y.o. male, history of BPH, chronic catheter use, who presents to the ED secondary to discharge in his catheter for the last 2 weeks.  States that he typically has his catheter changed every 30 to 45 days, and just recently had it changed 2 weeks ago, and has noticed that he developed a rash in his groin area, and that there has been increased white discharge in his Foley catheter.  States that it feels like something is blocking the catheter, and that he urinated outside of the catheter today, which made him feel ill.  Denies any fevers, chills.  Notes that rash and white discharge in the urine happened about the same time.  Denies any abdominal pain or back pain.    Home Medications Prior to Admission medications   Medication Sig Start Date End Date Taking? Authorizing Provider  cephALEXin (KEFLEX) 500 MG capsule Take 1 capsule (500 mg total) by mouth 4 (four) times daily. 01/24/22  Yes Aminata Buffalo L, PA  fluconazole (DIFLUCAN) 150 MG tablet Take 1 tablet (150 mg total) by mouth daily. 01/24/22  Yes Maddyx Wieck L, PA  acalabrutinib maleate (CALQUENCE) 100 MG tablet Take 1 tablet (100 mg) by mouth 2 (two) times daily. 09/15/21   Wyatt Portela, MD  acetaminophen (TYLENOL) 500 MG tablet Take 500 mg by mouth at bedtime.    [provider]  albuterol (PROVENTIL) (2.5 MG/3ML) 0.083% nebulizer solution Take 2.5 mg by nebulization every 6 (six) hours as needed for wheezing or shortness of breath.    [provider]  amiodarone (PACERONE) 200 MG tablet Take 1 tablet (200 mg total) by mouth daily. 09/01/21 11/30/21  [place] Donnamarie Poag, DO  ascorbic acid (VITAMIN C) 1000 MG tablet Take 1 tablet (1,000 mg total) by mouth daily. 07/15/21   Angiulli, Lavon Paganini, PA-C   docusate sodium (COLACE) 100 MG capsule Take 1 capsule (100 mg total) by mouth 2 (two) times daily. Patient taking differently: Take 100 mg by mouth daily as needed for mild constipation. 07/15/21   Angiulli, Lavon Paganini, PA-C  finasteride (PROSCAR) 5 MG tablet Take 1 tablet (5 mg total) by mouth daily. 07/15/21   Angiulli, Lavon Paganini, PA-C  melatonin 5 MG TABS Take 1 tablet (5 mg total) by mouth at bedtime. 07/15/21   Angiulli, Lavon Paganini, PA-C  methocarbamol (ROBAXIN) 500 MG tablet Take 1 tablet (500 mg total) by mouth 3 (three) times daily. Patient taking differently: Take 500 mg by mouth at bedtime as needed for muscle spasms. 07/15/21   Angiulli, Lavon Paganini, PA-C  oxymetazoline (AFRIN) 0.05 % nasal spray Place 1 spray into both nostrils 2 (two) times daily as needed for congestion.    [provider]  Oxymetazoline HCl (MUCINEX NASAL SPRAY FULL FORCE NA) Place 1 spray into the nose 2 (two) times daily as needed (congestion).    [provider]  rosuvastatin (CRESTOR) 20 MG tablet Take 1 tablet (20 mg total) by mouth daily. 08/10/21   Ulyses Amor, PA-C      Allergies    Patient has no known allergies.    Review of Systems   Review of Systems  Genitourinary:  Positive for penile discharge. Negative for flank pain and hematuria.  Skin:  Positive for  rash.    Physical Exam Updated Vital Signs BP (!) 150/77   Pulse 82   Temp 98.3 F (36.8 C) (Oral)   Resp 16   Ht 6' (1.829 m)   Wt 99 kg   SpO2 99%   BMI 29.60 kg/m  Physical Exam Vitals and nursing note reviewed.  Constitutional:      General: He is not in acute distress.    Appearance: He is well-developed.  HENT:     Head: Normocephalic and atraumatic.  Eyes:     Conjunctiva/sclera: Conjunctivae normal.  Cardiovascular:     Rate and Rhythm: Normal rate and regular rhythm.     Heart sounds: No murmur heard. Pulmonary:     Effort: Pulmonary effort is normal. No respiratory distress.     Breath sounds: Normal breath  sounds.  Abdominal:     Palpations: Abdomen is soft.     Tenderness: There is no abdominal tenderness.  Genitourinary:    Comments: +foley catheter Musculoskeletal:        General: No swelling.     Cervical back: Neck supple.  Skin:    General: Skin is warm and dry.     Capillary Refill: Capillary refill takes less than 2 seconds.     Comments: +erythematous rash w/satellite lesions along inguinal folds  Neurological:     Mental Status: He is alert.  Psychiatric:        Mood and Affect: Mood normal.     ED Results / Procedures / Treatments   Labs (all labs ordered are listed, but only abnormal results are displayed) Labs Reviewed  URINALYSIS, ROUTINE W REFLEX MICROSCOPIC - Abnormal; Notable for the following components:      Result Value   Color, Urine AMBER (*)    APPearance TURBID (*)    Hgb urine dipstick Rosaline Ezekiel (*)    Protein, ur 100 (*)    Leukocytes,Ua LARGE (*)    WBC, UA >50 (*)    Bacteria, UA RARE (*)    All other components within normal limits  URINE CULTURE    EKG None  Radiology No results found.  Procedures Procedures   Medications Ordered in ED Medications  fluconazole (DIFLUCAN) tablet 150 mg (150 mg Oral Given 01/24/22 1111)    ED Course/ Medical Decision Making/ A&P                           Medical Decision Making Patient is a 71 year old male, history of chronic catheter use, here for a white discharge and obstruction of his catheter.  He states he has noticed a rash in his groin for the last 2 weeks, and had this white discharge that has become more prevalent.  UA ordered for further evaluation.  We will treat for yeast.  Amount and/or Complexity of Data Reviewed Labs: ordered.    Details: Urinalysis significant for many white blood cells, large leuks, and some bacteria. Discussion of management or test interpretation with external provider(s): Discussed with on-call urology, Dr. Gilford Rile, he recommends Diflucan course for 3 days,  follow-up with urology in office.  Discussed with patient, will treat for complicated UTI Keflex 4 times a day for 10 days, with addition of Diflucan course.  Discussed given his chemotherapy, recommend him reaching out to his oncologist prior to him stopping taking his chemotherapy.  Discussed good Foley care, and close follow-up with urology, patient voiced understanding.  Risk Prescription drug management.     Final  Clinical Impression(s) / ED Diagnoses Final diagnoses:  Yeast infection  Complicated UTI (urinary tract infection)    Rx / DC Orders ED Discharge Orders          Ordered    fluconazole (DIFLUCAN) 150 MG tablet  Daily        01/24/22 1213    cephALEXin (KEFLEX) 500 MG capsule  4 times daily        01/24/22 1213              Camaria Gerald, [place], [place] 01/24/22 1218    Valarie Merino, MD 01/25/22 [phone]

## 2022-01-25 LAB — URINE CULTURE

## 2022-01-26 ENCOUNTER — Inpatient Hospital Stay (HOSPITAL_BASED_OUTPATIENT_CLINIC_OR_DEPARTMENT_OTHER): Payer: Medicare Other | Admitting: Oncology

## 2022-01-26 ENCOUNTER — Inpatient Hospital Stay: Payer: Medicare Other | Attending: Oncology

## 2022-01-26 ENCOUNTER — Other Ambulatory Visit: Payer: Self-pay

## 2022-01-26 VITALS — BP 133/73 | HR 89 | Temp 98.2°F | Resp 17 | Ht 72.0 in

## 2022-01-26 DIAGNOSIS — Z89611 Acquired absence of right leg above knee: Secondary | ICD-10-CM | POA: Insufficient documentation

## 2022-01-26 DIAGNOSIS — Z79899 Other long term (current) drug therapy: Secondary | ICD-10-CM | POA: Insufficient documentation

## 2022-01-26 DIAGNOSIS — C9110 Chronic lymphocytic leukemia of B-cell type not having achieved remission: Secondary | ICD-10-CM

## 2022-01-26 DIAGNOSIS — Z86718 Personal history of other venous thrombosis and embolism: Secondary | ICD-10-CM | POA: Diagnosis not present

## 2022-01-26 DIAGNOSIS — Z7982 Long term (current) use of aspirin: Secondary | ICD-10-CM | POA: Insufficient documentation

## 2022-01-26 LAB — CBC WITH DIFFERENTIAL (CANCER CENTER ONLY)
Abs Immature Granulocytes: 0.04 10*3/uL (ref 0.00–0.07)
Basophils Absolute: 0.1 10*3/uL (ref 0.0–0.1)
Basophils Relative: 1 %
Eosinophils Absolute: 0.1 10*3/uL (ref 0.0–0.5)
Eosinophils Relative: 1 %
HCT: 45.8 % (ref 39.0–52.0)
Hemoglobin: 15.2 g/dL (ref 13.0–17.0)
Immature Granulocytes: 0 %
Lymphocytes Relative: 33 %
Lymphs Abs: 3.5 10*3/uL (ref 0.7–4.0)
MCH: 28.7 pg (ref 26.0–34.0)
MCHC: 33.2 g/dL (ref 30.0–36.0)
MCV: 86.4 fL (ref 80.0–100.0)
Monocytes Absolute: 0.3 10*3/uL (ref 0.1–1.0)
Monocytes Relative: 3 %
Neutro Abs: 6.5 10*3/uL (ref 1.7–7.7)
Neutrophils Relative %: 62 %
Platelet Count: 583 10*3/uL — ABNORMAL HIGH (ref 150–400)
RBC: 5.3 MIL/uL (ref 4.22–5.81)
RDW: 18.6 % — ABNORMAL HIGH (ref 11.5–15.5)
WBC Count: 10.5 10*3/uL (ref 4.0–10.5)
nRBC: 0 % (ref 0.0–0.2)

## 2022-01-26 LAB — CMP (CANCER CENTER ONLY)
ALT: 34 U/L (ref 0–44)
AST: 23 U/L (ref 15–41)
Albumin: 4.2 g/dL (ref 3.5–5.0)
Alkaline Phosphatase: 73 U/L (ref 38–126)
Anion gap: 10 (ref 5–15)
BUN: 18 mg/dL (ref 8–23)
CO2: 26 mmol/L (ref 22–32)
Calcium: 9.4 mg/dL (ref 8.9–10.3)
Chloride: 101 mmol/L (ref 98–111)
Creatinine: 0.7 mg/dL (ref 0.61–1.24)
GFR, Estimated: >60 mL/min (ref >60)
Glucose, Bld: 96 mg/dL (ref 70–99)
Potassium: 3.7 mmol/L (ref 3.5–5.1)
Sodium: 137 mmol/L (ref 135–145)
Total Bilirubin: 0.3 mg/dL (ref 0.3–1.2)
Total Protein: 7.2 g/dL (ref 6.5–8.1)

## 2022-01-26 NOTE — Progress Notes (Signed)
Hematology and Oncology Follow Up Visit  Douglas [name] [id number] 19-Jan-1951 71 y.o. 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  He was found to have stage I with lymphocytosis and adenopathy.  Prior therapy: Ibrutinib for 20 mg started in August 2021.  Therapy discontinued because of drug interaction  Current therapy: Calquence 100 mg twice a day started in May 2023.  Therapy interrupted until July 2023 related to hospitalization and was resumed at that time.  Interim History: [name] returns today for repeat follow-up.  Since the last visit, he reports no major changes in his health.  He continues to tolerate Calquence without any issues.  He denies any nausea, fatigue or tiredness.  He denies any hematochezia or melena.  He denies any hemoptysis or hematemesis.  He denies any lymphadenopathy.          Medications: Updated on review. Current Outpatient Medications  Medication Sig Dispense Refill   acalabrutinib maleate (CALQUENCE) 100 MG tablet Take 1 tablet (100 mg) by mouth 2 (two) times daily. 60 tablet 11   acetaminophen (TYLENOL) 500 MG tablet Take 500 mg by mouth at bedtime.     albuterol (PROVENTIL) (2.5 MG/3ML) 0.083% nebulizer solution Take 2.5 mg by nebulization every 6 (six) hours as needed for wheezing or shortness of breath.     amiodarone (PACERONE) 200 MG tablet Take 1 tablet (200 mg total) by mouth daily. 30 tablet 2   ascorbic acid (VITAMIN C) 1000 MG tablet Take 1 tablet (1,000 mg total) by mouth daily. 30 tablet 0   cephALEXin (KEFLEX) 500 MG capsule Take 1 capsule (500 mg total) by mouth 4 (four) times daily. 40 capsule 0   docusate sodium (COLACE) 100 MG capsule Take 1 capsule (100 mg total) by mouth 2 (two) times daily. (Patient taking differently: Take 100 mg by mouth daily as needed for mild constipation.) 10 capsule 0   finasteride (PROSCAR) 5 MG  tablet Take 1 tablet (5 mg total) by mouth daily. 30 tablet 0   fluconazole (DIFLUCAN) 150 MG tablet Take 1 tablet (150 mg total) by mouth daily. 3 tablet 0   melatonin 5 MG TABS Take 1 tablet (5 mg total) by mouth at bedtime. 30 tablet 0   methocarbamol (ROBAXIN) 500 MG tablet Take 1 tablet (500 mg total) by mouth 3 (three) times daily. (Patient taking differently: Take 500 mg by mouth at bedtime as needed for muscle spasms.) 90 tablet 0   oxymetazoline (AFRIN) 0.05 % nasal spray Place 1 spray into both nostrils 2 (two) times daily as needed for congestion.     Oxymetazoline HCl (MUCINEX NASAL SPRAY FULL FORCE NA) Place 1 spray into the nose 2 (two) times daily as needed (congestion).     rosuvastatin (CRESTOR) 20 MG tablet Take 1 tablet (20 mg total) by mouth daily. 30 tablet 5   No current facility-administered medications for this visit.     Allergies: No Known Allergies      Physical Exam:     Blood pressure 133/73, pulse 89, temperature 98.2 F (36.8 C), temperature source Temporal, resp. rate 17, height 6' (1.829 m), SpO2 99 %.     ECOG: 0     General appearance: Alert, awake without any distress. Head: Atraumatic without abnormalities Oropharynx: Without any thrush or ulcers. Eyes: No scleral icterus. Lymph nodes: No lymphadenopathy noted in the cervical, supraclavicular, or axillary nodes Heart:regular rate and rhythm,  without any murmurs or gallops.   Lung: Clear to auscultation without any rhonchi, wheezes or dullness to percussion. Abdomin: Soft, nontender without any shifting dullness or ascites. Musculoskeletal: No clubbing or cyanosis. Neurological: No motor or sensory deficits. Skin: No rashes or lesions.             Lab Results: Lab Results  Component Value Date   WBC 17.9 (H) 10/26/2021   HGB 13.6 10/26/2021   HCT 42.7 10/26/2021   MCV 83.1 10/26/2021   PLT 270 10/26/2021     Chemistry      Component Value Date/Time   NA 138  10/26/2021 1352   K 3.4 (L) 10/26/2021 1352   CL 100 10/26/2021 1352   CO2 29 10/26/2021 1352   BUN 13 10/26/2021 1352   CREATININE 0.57 (L) 10/26/2021 1352      Component Value Date/Time   CALCIUM 9.5 10/26/2021 1352   ALKPHOS 83 10/26/2021 1352   AST 13 (L) 10/26/2021 1352   ALT 16 10/26/2021 1352   BILITOT 0.3 10/26/2021 1352        Impression and Plan:   71 year old with:  1.  CLL diagnosed in August 2020.  He was found to have lymphadenopathy and ATM mutation.   The natural course of this disease was reviewed at this time and treatment options were reiterated.  Complication associated with Calquence were reviewed.  These would include nausea, fatigue, bleeding as well as cardiac complications.  Laboratory data from today reviewed and showed normal white cell count indicating complete response.  At this time, he is agreeable to continue.    2.  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  He remains on aspirin and Plavix  3.  Cardiac arrhythmia: No recent exacerbation and currently on amiodarone.   4.  Follow-up: In 3 months for a follow-up visit.   30  minutes were spent on this encounter.  Time was dedicated to reviewing laboratory data, disease status update and outlining future plan of care discussion.    [name], MD 10/18/20232:25 PM

## 2022-01-31 ENCOUNTER — Other Ambulatory Visit (HOSPITAL_COMMUNITY): Payer: Self-pay

## 2022-02-02 NOTE — Progress Notes (Signed)
Remote pacemaker transmission.   

## 2022-02-03 ENCOUNTER — Other Ambulatory Visit (HOSPITAL_COMMUNITY): Payer: Self-pay

## 2022-02-10 ENCOUNTER — Other Ambulatory Visit (HOSPITAL_COMMUNITY): Payer: Self-pay

## 2022-02-15 ENCOUNTER — Other Ambulatory Visit (HOSPITAL_COMMUNITY): Payer: Self-pay

## 2022-03-06 ENCOUNTER — Emergency Department (HOSPITAL_COMMUNITY)
Admission: EM | Admit: 2022-03-06 | Discharge: 2022-03-07 | Discharge status: Against Medical Advice | Payer: Medicare Other | Attending: Emergency Medicine | Admitting: Emergency Medicine

## 2022-03-06 ENCOUNTER — Other Ambulatory Visit: Payer: Self-pay

## 2022-03-06 DIAGNOSIS — Z5321 Procedure and treatment not carried out due to patient leaving prior to being seen by health care provider: Secondary | ICD-10-CM | POA: Insufficient documentation

## 2022-03-06 DIAGNOSIS — R310 Gross hematuria: Secondary | ICD-10-CM | POA: Insufficient documentation

## 2022-03-06 LAB — CBC WITH DIFFERENTIAL/PLATELET
Abs Immature Granulocytes: 0.1 10*3/uL — ABNORMAL HIGH (ref 0.00–0.07)
Basophils Absolute: 0.1 10*3/uL (ref 0.0–0.1)
Basophils Relative: 1 %
Eosinophils Absolute: 0 10*3/uL (ref 0.0–0.5)
Eosinophils Relative: 0 %
HCT: 46.2 % (ref 39.0–52.0)
Hemoglobin: 14.9 g/dL (ref 13.0–17.0)
Immature Granulocytes: 1 %
Lymphocytes Relative: 10 %
Lymphs Abs: 1.8 10*3/uL (ref 0.7–4.0)
MCH: 28.4 pg (ref 26.0–34.0)
MCHC: 32.3 g/dL (ref 30.0–36.0)
MCV: 88 fL (ref 80.0–100.0)
Monocytes Absolute: 0.6 10*3/uL (ref 0.1–1.0)
Monocytes Relative: 3 %
Neutro Abs: 15.7 10*3/uL — ABNORMAL HIGH (ref 1.7–7.7)
Neutrophils Relative %: 85 %
Platelets: 898 10*3/uL — ABNORMAL HIGH (ref 150–400)
RBC: 5.25 MIL/uL (ref 4.22–5.81)
RDW: 17.3 % — ABNORMAL HIGH (ref 11.5–15.5)
WBC: 18.3 10*3/uL — ABNORMAL HIGH (ref 4.0–10.5)
nRBC: 0 % (ref 0.0–0.2)

## 2022-03-06 LAB — COMPREHENSIVE METABOLIC PANEL
ALT: 19 U/L (ref 0–44)
AST: 19 U/L (ref 15–41)
Albumin: 3.8 g/dL (ref 3.5–5.0)
Alkaline Phosphatase: 79 U/L (ref 38–126)
Anion gap: 16 — ABNORMAL HIGH (ref 5–15)
BUN: 13 mg/dL (ref 8–23)
CO2: 23 mmol/L (ref 22–32)
Calcium: 9.4 mg/dL (ref 8.9–10.3)
Chloride: 96 mmol/L — ABNORMAL LOW (ref 98–111)
Creatinine, Ser: 0.8 mg/dL (ref 0.61–1.24)
GFR, Estimated: >60 mL/min (ref >60)
Glucose, Bld: 149 mg/dL — ABNORMAL HIGH (ref 70–99)
Potassium: 3.7 mmol/L (ref 3.5–5.1)
Sodium: 135 mmol/L (ref 135–145)
Total Bilirubin: 0.8 mg/dL (ref 0.3–1.2)
Total Protein: 6.5 g/dL (ref 6.5–8.1)

## 2022-03-06 NOTE — ED Triage Notes (Signed)
BIB EMS with UTI since Oct 16, has had several rounds of antibiotics, finished last dose on Friday, now has frequent urination. CBG 250 117/56-100-99% RA

## 2022-03-06 NOTE — ED Provider Triage Note (Signed)
Emergency Medicine Provider Triage Evaluation Note  Abid Bolla , a 71 y.o. male  was evaluated in triage.  Pt complains of recurrent UTI, dysuria, and incontinence x 3 days. Just finished a course of ABX, follows with Dr Tresa Moore of [facility].  Review of Systems  Positive: Dysuria, incontinence, bladder spasms, chills Negative: Fever, vomiting, diarrhea  Physical Exam  BP 101/77 (BP Location: Right Arm)   Pulse (!) 111   Temp 98.2 F (36.8 C) (Oral)   Resp 18   Ht 6' (1.829 m)   Wt 97.5 kg   SpO2 91%   BMI 29.16 kg/m  Gen:   Awake, no distress   Resp:  Normal effort  MSK:   Moves extremities without difficulty  Other:    Medical Decision Making  Medically screening exam initiated at 12:32 PM.  Appropriate orders placed.  Todd Jelinski was informed that the remainder of the evaluation will be completed by another provider, this initial triage assessment does not replace that evaluation, and the importance of remaining in the ED until their evaluation is complete.  Workup initiated   Chike Farrington T, PA-C 03/06/22 1233

## 2022-03-07 NOTE — ED Notes (Signed)
Chart from previous shift- not visualized in lobby. Does not answer when called.

## 2022-03-08 ENCOUNTER — Other Ambulatory Visit (HOSPITAL_COMMUNITY): Payer: Self-pay

## 2022-03-14 ENCOUNTER — Other Ambulatory Visit (HOSPITAL_COMMUNITY): Payer: Self-pay

## 2022-03-28 ENCOUNTER — Telehealth: Payer: Self-pay | Admitting: *Deleted

## 2022-03-28 NOTE — Telephone Encounter (Signed)
Patient wife called and states patient is having a lot of nerve pain in his LLE. She states he is really restless. She states patient has no open wounds and his leg is warm but he keeps stating he has nerve pain and she is requesting patient be seen and possibly referred to Neuro if its not a vascular problem. Patient is due for 6 month follow up with labs so scheduled patient for appt with PA and lab studies for this Wednesday. Patient wife  verbalized understanding of scheduled appt.

## 2022-03-29 NOTE — Progress Notes (Unsigned)
HISTORY AND PHYSICAL     CC:  follow up. Requesting Provider:  [name], MD  HPI: This is a 71 y.o. male who is here today for follow up for PAD.  Pt has hx of CLL and vascular surgery was originally consulted in March 2023 for ischemia of the right leg.  Pt underwent angiogram and subsequently right CFA to BK popliteal artery bypass with non reversed ipsilateral translocated GSV and exposure of the right PTA at the ankle and thrombectomy with instillation '5mg'$  tPA on 06/22/2021 by Dr. Donzetta Matters.  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  He then underwent right AKA due to non healing right BKA on 07/19/2021 by Dr. Scot Dock.    On 08/04/2021, he underwent angiogram with stent to the left SFA by Dr. Trula Slade.  On 08/06/2021, he was found to have iliocaval and LLE DVT and underwent mechanical thrombectomy of the IVC, left iliac veins, femoral veins and popliteal vein by Dr. Stanford Breed.   He subsequently developed hematuria and had Cook IVC filter placed on 08/30/2021 by Dr. Donzetta Matters.  Pt was last seen 10/27/2021 by Dr. Donzetta Matters and at that time, he had a palpable left DP pulse and his LLE swelling had resolved.  His IVC filter was in place and considered indefinite given all his issues.  He was on asa and plavix but no AC.  He was deferring prosthetic at the time.  He was scheduled for 6 month follow up.    He has chronic indwelling foley catheter and follows with Dr. Tresa Moore with urology.   The pt returns today for follow up.  ***  The pt is on a statin for cholesterol management.    The pt *** on an aspirin.    Other AC:  *** The pt *** on *** for hypertension.  The pt does *** have diabetes. Tobacco hx:  ***  Pt does *** have family hx of AAA.  Past Medical History:  Diagnosis Date   BPH (benign prostatic hyperplasia)    CLL (chronic lymphocytic leukemia) ([place])    Hypertension    Pacemaker    Medtronic Device   Pulmonary embolism (HCC)    PVD (peripheral vascular disease) ([place])     s/p stent, thrombectomy    Past Surgical History:  Procedure Laterality Date   ABDOMINAL AORTOGRAM W/LOWER EXTREMITY Bilateral 06/21/2021   Procedure: ABDOMINAL AORTOGRAM W/LOWER EXTREMITY;  Surgeon: Waynetta Sandy, MD;  Location: [facility];  Service: Cardiovascular;  Laterality: Bilateral;   ABDOMINAL AORTOGRAM W/LOWER EXTREMITY N/A 08/04/2021   Procedure: ABDOMINAL AORTOGRAM W/LOWER EXTREMITY;  Surgeon: Serafina Mitchell, MD;  Location: [facility];  Service: Cardiovascular;  Laterality: N/A;   AMPUTATION Right 06/25/2021   Procedure: RIGHT BELOW KNEE AMPUTATION;  Surgeon: Waynetta Sandy, MD;  Location: [place];  Service: Vascular;  Laterality: Right;   AMPUTATION Right 07/19/2021   Procedure: AMPUTATION ABOVE KNEE;  Surgeon: Angelia Mould, MD;  Location: [place];  Service: Vascular;  Laterality: Right;   FEMORAL-POPLITEAL BYPASS GRAFT Right 06/22/2021   Procedure: RIGHT FEMORAL-POPLITEAL BYPASS WITH VEIN, RIGHT POPLITEAL THROMBECTOMY;  Surgeon: Waynetta Sandy, MD;  Location: [place];  Service: Vascular;  Laterality: Right;   IVC FILTER INSERTION N/A 08/30/2021   Procedure: IVC FILTER INSERTION;  Surgeon: Waynetta Sandy, MD;  Location: [facility];  Service: Cardiovascular;  Laterality: N/A;   LEFT HEART CATH AND CORONARY ANGIOGRAPHY N/A 01/18/2021   Procedure: LEFT HEART  CATH AND CORONARY ANGIOGRAPHY;  Surgeon: Burnell Blanks, MD;  Location: [facility];  Service: Cardiovascular;  Laterality: N/A;   LOWER EXTREMITY VENOGRAPHY Left 08/06/2021   Procedure: LOWER EXTREMITY VENOGRAPHY;  Surgeon: Cherre Robins, MD;  Location: [facility];  Service: Cardiovascular;  Laterality: Left;   PACEMAKER IMPLANT N/A 01/19/2021   Procedure: PACEMAKER IMPLANT;  Surgeon: Evans Lance, MD;  Location: [facility];  Service: Cardiovascular;  Laterality: N/A;   PERIPHERAL VASCULAR INTERVENTION  08/04/2021    Procedure: PERIPHERAL VASCULAR INTERVENTION;  Surgeon: Serafina Mitchell, MD;  Location: [facility];  Service: Cardiovascular;;   PERIPHERAL VASCULAR THROMBECTOMY  08/06/2021   Procedure: PERIPHERAL VASCULAR THROMBECTOMY;  Surgeon: Cherre Robins, MD;  Location: [facility];  Service: Cardiovascular;;  Left Popliteal Vein    No Known Allergies  Current Outpatient Medications  Medication Sig Dispense Refill   acalabrutinib maleate (CALQUENCE) 100 MG tablet Take 1 tablet (100 mg) by mouth 2 (two) times daily. 60 tablet 11   acetaminophen (TYLENOL) 500 MG tablet Take 500 mg by mouth at bedtime.     albuterol (PROVENTIL) (2.5 MG/3ML) 0.083% nebulizer solution Take 2.5 mg by nebulization every 6 (six) hours as needed for wheezing or shortness of breath.     amiodarone (PACERONE) 200 MG tablet Take 1 tablet (200 mg total) by mouth daily. 30 tablet 2   ascorbic acid (VITAMIN C) 1000 MG tablet Take 1 tablet (1,000 mg total) by mouth daily. 30 tablet 0   cephALEXin (KEFLEX) 500 MG capsule Take 1 capsule (500 mg total) by mouth 4 (four) times daily. 40 capsule 0   docusate sodium (COLACE) 100 MG capsule Take 1 capsule (100 mg total) by mouth 2 (two) times daily. (Patient taking differently: Take 100 mg by mouth daily as needed for mild constipation.) 10 capsule 0   finasteride (PROSCAR) 5 MG tablet Take 1 tablet (5 mg total) by mouth daily. 30 tablet 0   fluconazole (DIFLUCAN) 150 MG tablet Take 1 tablet (150 mg total) by mouth daily. 3 tablet 0   melatonin 5 MG TABS Take 1 tablet (5 mg total) by mouth at bedtime. 30 tablet 0   methocarbamol (ROBAXIN) 500 MG tablet Take 1 tablet (500 mg total) by mouth 3 (three) times daily. (Patient taking differently: Take 500 mg by mouth at bedtime as needed for muscle spasms.) 90 tablet 0   oxymetazoline (AFRIN) 0.05 % nasal spray Place 1 spray into both nostrils 2 (two) times daily as needed for congestion.     Oxymetazoline HCl (MUCINEX NASAL SPRAY  FULL FORCE NA) Place 1 spray into the nose 2 (two) times daily as needed (congestion).     rosuvastatin (CRESTOR) 20 MG tablet Take 1 tablet (20 mg total) by mouth daily. 30 tablet 5   No current facility-administered medications for this visit.    Family History  Problem Relation Age of Onset   Lung cancer Mother    Coronary artery disease Father     Social History   Socioeconomic History   Marital status: Married    Spouse name: Not on file   Number of children: Not on file   Years of education: Not on file   Highest education level: Not on file  Occupational History   Occupation: retired  Tobacco Use   Smoking status: Former    Packs/day: 0.50    Years: 10.00    Total pack years: 5.00  Types: Cigarettes   Smokeless tobacco: Never  Vaping Use   Vaping Use: Never used  Substance and Sexual Activity   Alcohol use: Not Currently   Drug use: Never   Sexual activity: Not on file  Other Topics Concern   Not on file  Social History Narrative   Not on file   Social Determinants of Health   Financial Resource Strain: Not on file  Food Insecurity: Not on file  Transportation Needs: Not on file  Physical Activity: Not on file  Stress: Not on file  Social Connections: Not on file  Intimate Partner Violence: Not on file     REVIEW OF SYSTEMS:  *** '[X]'$  denotes positive finding, '[ ]'$  denotes negative finding Cardiac  Comments:  Chest pain or chest pressure:    Shortness of breath upon exertion:    Short of breath when lying flat:    Irregular heart rhythm:        Vascular    Pain in calf, thigh, or hip brought on by ambulation:    Pain in feet at night that wakes you up from your sleep:     Blood clot in your veins:    Leg swelling:         Pulmonary    Oxygen at home:    Productive cough:     Wheezing:         Neurologic    Sudden weakness in arms or legs:     Sudden numbness in arms or legs:     Sudden onset of difficulty speaking or slurred speech:     Temporary loss of vision in one eye:     Problems with dizziness:         Gastrointestinal    Blood in stool:     Vomited blood:         Genitourinary    Burning when urinating:     Blood in urine:        Psychiatric    Major depression:         Hematologic    Bleeding problems:    Problems with blood clotting too easily:        Skin    Rashes or ulcers:        Constitutional    Fever or chills:      PHYSICAL EXAMINATION:  ***  General:  WDWN in NAD; vital signs documented above Gait: Not observed HENT: WNL, normocephalic Pulmonary: normal non-labored breathing , without wheezing Cardiac: {Desc; regular/irreg:14544} HR, {With/Without:20273} carotid bruit*** Abdomen: soft, NT; aortic pulse is *** palpable Skin: {With/Without:20273} rashes Vascular Exam/Pulses:  Right Left  Radial {Exam; arterial pulse strength 0-4:30167} {Exam; arterial pulse strength 0-4:30167}  Femoral {Exam; arterial pulse strength 0-4:30167} {Exam; arterial pulse strength 0-4:30167}  Popliteal {Exam; arterial pulse strength 0-4:30167} {Exam; arterial pulse strength 0-4:30167}  DP {Exam; arterial pulse strength 0-4:30167} {Exam; arterial pulse strength 0-4:30167}  PT {Exam; arterial pulse strength 0-4:30167} {Exam; arterial pulse strength 0-4:30167}  Peroneal *** ***   Extremities: {With/Without:20273} ischemic changes, {With/Without:20273} Gangrene , {With/Without:20273} cellulitis; {With/Without:20273} open wounds Musculoskeletal: no muscle wasting or atrophy  Neurologic: A&O X 3 Psychiatric:  The pt has {Desc; normal/abnormal:11317::"Normal"} affect.   Non-Invasive Vascular Imaging:   ABI's/TBI's on 03/30/2022: Right:  *** - Great toe pressure: *** Left:  *** - Great toe pressure: ***  Arterial duplex on 03/30/2022: ***  Previous ABI's/TBI's on 10/27/2021: Right:  AKA Left:  1.12/0.58 - Great toe pressure:  99  Previous  arterial duplex on 10/27/2021: Left: Patent stent with no  evidence for restenosis     ASSESSMENT/PLAN:: 71 y.o. male here for follow up for PAD with hx of CLL and vascular surgery was originally consulted in March 2023 for ischemia of the right leg.  Pt underwent angiogram and subsequently right CFA to BK popliteal artery bypass with non reversed ipsilateral translocated GSV and exposure of the right PTA at the ankle and thrombectomy with instillation '5mg'$  tPA on 06/22/2021 by Dr. Donzetta Matters.  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  He then underwent right AKA due to non healing right BKA on 07/19/2021 by Dr. Scot Dock.    On 08/04/2021, he underwent angiogram with stent to the left SFA by Dr. Trula Slade.  On 08/06/2021, he was found to have iliocaval and LLE DVT and underwent mechanical thrombectomy of the IVC, left iliac veins, femoral veins and popliteal vein by Dr. Stanford Breed.   He subsequently developed hematuria and had Cook IVC filter placed on 08/30/2021 by Dr. Donzetta Matters.   -*** -continue *** -pt will f/u in *** with ***.   Leontine Locket, [facility] [phone]  Clinic MD:   Donzetta Matters

## 2022-03-30 ENCOUNTER — Ambulatory Visit (INDEPENDENT_AMBULATORY_CARE_PROVIDER_SITE_OTHER)
Admission: RE | Admit: 2022-03-30 | Discharge: 2022-03-30 | Disposition: A | Discharge status: Home / Self Care | Payer: Medicare Other | Source: Ambulatory Visit | Attending: Vascular Surgery | Admitting: Vascular Surgery

## 2022-03-30 ENCOUNTER — Other Ambulatory Visit: Payer: Self-pay | Admitting: *Deleted

## 2022-03-30 ENCOUNTER — Ambulatory Visit (HOSPITAL_COMMUNITY)
Admission: RE | Admit: 2022-03-30 | Discharge: 2022-03-30 | Disposition: A | Discharge status: Home / Self Care | Payer: Medicare Other | Source: Ambulatory Visit | Attending: Vascular Surgery | Admitting: Vascular Surgery

## 2022-03-30 ENCOUNTER — Ambulatory Visit (INDEPENDENT_AMBULATORY_CARE_PROVIDER_SITE_OTHER): Payer: Medicare Other | Admitting: Physician Assistant

## 2022-03-30 VITALS — BP 129/68 | HR 89 | Temp 98.5°F | Resp 20 | Ht 72.0 in

## 2022-03-30 DIAGNOSIS — I739 Peripheral vascular disease, unspecified: Secondary | ICD-10-CM | POA: Insufficient documentation

## 2022-03-30 DIAGNOSIS — I70221 Atherosclerosis of native arteries of extremities with rest pain, right leg: Secondary | ICD-10-CM | POA: Insufficient documentation

## 2022-03-30 MED ORDER — GABAPENTIN 300 MG PO CAPS: 300.0000 mg | ORAL_CAPSULE | Freq: Two times a day (BID) | ORAL | 1 refills | Status: DC

## 2022-04-05 ENCOUNTER — Other Ambulatory Visit (HOSPITAL_COMMUNITY): Payer: Self-pay

## 2022-04-19 ENCOUNTER — Other Ambulatory Visit (HOSPITAL_COMMUNITY): Payer: Self-pay

## 2022-04-19 ENCOUNTER — Ambulatory Visit (INDEPENDENT_AMBULATORY_CARE_PROVIDER_SITE_OTHER): Payer: Medicare Other

## 2022-04-19 DIAGNOSIS — I441 Atrioventricular block, second degree: Secondary | ICD-10-CM

## 2022-04-19 LAB — CUP PACEART REMOTE DEVICE CHECK
Battery Remaining Longevity: 142 mo
Battery Voltage: 3.04 V
Brady Statistic AP VP Percent: 1.73 %
Brady Statistic AP VS Percent: 0 %
Brady Statistic AS VP Percent: 96.6 %
Brady Statistic AS VS Percent: 1.67 %
Brady Statistic RA Percent Paced: 2.1 %
Brady Statistic RV Percent Paced: 98.33 %
Date Time Interrogation Session: 20240108194812
Implantable Lead Connection Status: 753985
Implantable Lead Connection Status: 753985
Implantable Lead Implant Date: 20221011
Implantable Lead Implant Date: 20221011
Implantable Lead Location: 753859
Implantable Lead Location: 753860
Implantable Lead Model: 3830
Implantable Lead Model: 5076
Implantable Pulse Generator Implant Date: 20221011
Lead Channel Impedance Value: 342 Ohm
Lead Channel Impedance Value: 380 Ohm
Lead Channel Impedance Value: 456 Ohm
Lead Channel Impedance Value: 589 Ohm
Lead Channel Pacing Threshold Amplitude: 0.625 V
Lead Channel Pacing Threshold Amplitude: 0.75 V
Lead Channel Pacing Threshold Pulse Width: 0.4 ms
Lead Channel Pacing Threshold Pulse Width: 0.4 ms
Lead Channel Sensing Intrinsic Amplitude: 3.5 mV
Lead Channel Sensing Intrinsic Amplitude: 3.5 mV
Lead Channel Sensing Intrinsic Amplitude: 5.75 mV
Lead Channel Sensing Intrinsic Amplitude: 5.75 mV
Lead Channel Setting Pacing Amplitude: 1.5 V
Lead Channel Setting Pacing Amplitude: 2 V
Lead Channel Setting Pacing Pulse Width: 0.4 ms
Lead Channel Setting Sensing Sensitivity: 0.9 mV
Zone Setting Status: 755011
Zone Setting Status: 755011

## 2022-04-25 ENCOUNTER — Other Ambulatory Visit (HOSPITAL_COMMUNITY): Payer: Self-pay

## 2022-04-25 ENCOUNTER — Telehealth: Payer: Self-pay | Admitting: Oncology

## 2022-04-25 NOTE — Telephone Encounter (Signed)
Called patient per Dr. Alen Blew transition. Patient cancelled 1/17 appointments with Dr. Alen Blew due to impending weather. Forwarded patient information to schedulers at [facility] location to continue care

## 2022-04-27 ENCOUNTER — Other Ambulatory Visit: Payer: Medicare Other

## 2022-04-27 ENCOUNTER — Ambulatory Visit: Payer: Medicare Other | Admitting: Oncology

## 2022-05-13 NOTE — Progress Notes (Signed)
Remote pacemaker transmission.   

## 2022-05-18 ENCOUNTER — Other Ambulatory Visit (HOSPITAL_COMMUNITY): Payer: Self-pay

## 2022-05-20 ENCOUNTER — Other Ambulatory Visit (HOSPITAL_COMMUNITY): Payer: Self-pay

## 2022-06-01 ENCOUNTER — Other Ambulatory Visit: Payer: Self-pay | Admitting: *Deleted

## 2022-06-01 DIAGNOSIS — C9110 Chronic lymphocytic leukemia of B-cell type not having achieved remission: Secondary | ICD-10-CM

## 2022-06-02 ENCOUNTER — Inpatient Hospital Stay (HOSPITAL_BASED_OUTPATIENT_CLINIC_OR_DEPARTMENT_OTHER): Payer: Medicare Other | Admitting: Oncology

## 2022-06-02 ENCOUNTER — Inpatient Hospital Stay: Payer: Medicare Other | Attending: Oncology

## 2022-06-02 VITALS — BP 138/70 | HR 97 | Temp 98.2°F | Resp 20 | Ht 72.0 in | Wt 199.1 lb

## 2022-06-02 DIAGNOSIS — C9110 Chronic lymphocytic leukemia of B-cell type not having achieved remission: Secondary | ICD-10-CM

## 2022-06-02 DIAGNOSIS — Z89511 Acquired absence of right leg below knee: Secondary | ICD-10-CM | POA: Insufficient documentation

## 2022-06-02 DIAGNOSIS — Z7982 Long term (current) use of aspirin: Secondary | ICD-10-CM | POA: Insufficient documentation

## 2022-06-02 DIAGNOSIS — Z89611 Acquired absence of right leg above knee: Secondary | ICD-10-CM | POA: Diagnosis not present

## 2022-06-02 DIAGNOSIS — Z7901 Long term (current) use of anticoagulants: Secondary | ICD-10-CM | POA: Diagnosis not present

## 2022-06-02 DIAGNOSIS — Z87891 Personal history of nicotine dependence: Secondary | ICD-10-CM | POA: Insufficient documentation

## 2022-06-02 DIAGNOSIS — Z86718 Personal history of other venous thrombosis and embolism: Secondary | ICD-10-CM | POA: Insufficient documentation

## 2022-06-02 DIAGNOSIS — D75839 Thrombocytosis, unspecified: Secondary | ICD-10-CM | POA: Diagnosis not present

## 2022-06-02 DIAGNOSIS — I10 Essential (primary) hypertension: Secondary | ICD-10-CM | POA: Diagnosis not present

## 2022-06-02 DIAGNOSIS — I739 Peripheral vascular disease, unspecified: Secondary | ICD-10-CM | POA: Insufficient documentation

## 2022-06-02 DIAGNOSIS — Z86711 Personal history of pulmonary embolism: Secondary | ICD-10-CM | POA: Insufficient documentation

## 2022-06-02 LAB — CMP (CANCER CENTER ONLY)
ALT: 22 U/L (ref 0–44)
AST: 15 U/L (ref 15–41)
Albumin: 4.2 g/dL (ref 3.5–5.0)
Alkaline Phosphatase: 72 U/L (ref 38–126)
Anion gap: 11 (ref 5–15)
BUN: 19 mg/dL (ref 8–23)
CO2: 28 mmol/L (ref 22–32)
Calcium: 9.5 mg/dL (ref 8.9–10.3)
Chloride: 97 mmol/L — ABNORMAL LOW (ref 98–111)
Creatinine: 0.72 mg/dL (ref 0.61–1.24)
GFR, Estimated: >60 mL/min (ref >60)
Glucose, Bld: 331 mg/dL — ABNORMAL HIGH (ref 70–99)
Potassium: 3.6 mmol/L (ref 3.5–5.1)
Sodium: 136 mmol/L (ref 135–145)
Total Bilirubin: 0.3 mg/dL (ref 0.3–1.2)
Total Protein: 6.4 g/dL — ABNORMAL LOW (ref 6.5–8.1)

## 2022-06-02 LAB — CBC WITH DIFFERENTIAL (CANCER CENTER ONLY)
Abs Immature Granulocytes: 0.06 10*3/uL (ref 0.00–0.07)
Basophils Absolute: 0.1 10*3/uL (ref 0.0–0.1)
Basophils Relative: 1 %
Eosinophils Absolute: 0.2 10*3/uL (ref 0.0–0.5)
Eosinophils Relative: 2 %
HCT: 44.5 % (ref 39.0–52.0)
Hemoglobin: 14.7 g/dL (ref 13.0–17.0)
Immature Granulocytes: 1 %
Lymphocytes Relative: 25 %
Lymphs Abs: 2.3 10*3/uL (ref 0.7–4.0)
MCH: 29.4 pg (ref 26.0–34.0)
MCHC: 33 g/dL (ref 30.0–36.0)
MCV: 89 fL (ref 80.0–100.0)
Monocytes Absolute: 0.4 10*3/uL (ref 0.1–1.0)
Monocytes Relative: 5 %
Neutro Abs: 6 10*3/uL (ref 1.7–7.7)
Neutrophils Relative %: 66 %
Platelet Count: 618 10*3/uL — ABNORMAL HIGH (ref 150–400)
RBC: 5 MIL/uL (ref 4.22–5.81)
RDW: 16.9 % — ABNORMAL HIGH (ref 11.5–15.5)
WBC Count: 9.1 10*3/uL (ref 4.0–10.5)
nRBC: 0 % (ref 0.0–0.2)

## 2022-06-02 NOTE — Progress Notes (Signed)
[facility] New Patient Consult   Requesting MD: [name], Md [place],  [place] [address]   Douglas [name] 72 y.o.  1951-02-16    Reason for Consult: CLL   HPI: History Douglas [name] was diagnosed with CLL in 2020 after presenting with lymphocytosis and lymphadenopathy.  He began treatment with ibrutinib in August 2021.  He experienced improvement in lymphocytosis and lymphadenopathy while on ibrutinib.  He developed right lower extremity ischemia in March 2023 he underwent a failed bypass/thrombectomy 06/22/2021 followed by a right BKA 06/25/2021.  The right below-knee amputation failed while on the rehabilitation unit and he was readmitted for a right AKA on 07/19/2021.  He was readmitted in May 2023 with left leg pain.  He was diagnosed with a ileal caval and left lower extremity DVT and underwent a thrombectomy.  He was transitioned to Eliquis.  He was diagnosed with bilateral pulmonary embolism.  He developed gross hematuria.  An IVC filter was placed.  Repeat chest CT was negative for PE.  Ibrutinib and apixaban were discontinued.  He is now maintained on aspirin and Plavix.  The IVC filter remains in place.  He was switched to acalabrutinib for treatment of the CLL in June 2023.  Douglas [name] developed urinary retention following the amputation procedure.  A Foley catheter remains in place.  He is considering placement of a suprapubic catheter.  He reports being treated for a "yeast "urinary tract infection a few months ago.  He reports tolerating the acalabrutinib well.  No arthralgias.  No rash or bleeding.      Past Medical History:  Diagnosis Date   BPH (benign prostatic hyperplasia)    CLL (chronic lymphocytic leukemia) ([place])    Hypertension    Pacemaker    Medtronic Device   Pulmonary embolism (HCC)    PVD (peripheral vascular disease) ([place])    s/p stent, thrombectomy    .  SVT, bradycardia-pacemaker in place  Past  Surgical History:  Procedure Laterality Date   ABDOMINAL AORTOGRAM W/LOWER EXTREMITY Bilateral 06/21/2021   Procedure: ABDOMINAL AORTOGRAM W/LOWER EXTREMITY;  Surgeon: Waynetta Sandy, MD;  Location: [facility];  Service: Cardiovascular;  Laterality: Bilateral;   ABDOMINAL AORTOGRAM W/LOWER EXTREMITY N/A 08/04/2021   Procedure: ABDOMINAL AORTOGRAM W/LOWER EXTREMITY;  Surgeon: Serafina Mitchell, MD;  Location: [facility] [facility];  Service: Cardiovascular;  Laterality: N/A;   AMPUTATION Right 06/25/2021   Procedure: RIGHT BELOW KNEE AMPUTATION;  Surgeon: Waynetta Sandy, MD;  Location: [place];  Service: Vascular;  Laterality: Right;   AMPUTATION Right 07/19/2021   Procedure: AMPUTATION ABOVE KNEE;  Surgeon: Angelia Mould, MD;  Location: [facility];  Service: Vascular;  Laterality: Right;   FEMORAL-POPLITEAL BYPASS GRAFT Right 06/22/2021   Procedure: RIGHT FEMORAL-POPLITEAL BYPASS WITH VEIN, RIGHT POPLITEAL THROMBECTOMY;  Surgeon: Waynetta Sandy, MD;  Location: [place];  Service: Vascular;  Laterality: Right;   IVC FILTER INSERTION N/A 08/30/2021   Procedure: IVC FILTER INSERTION;  Surgeon: Waynetta Sandy, MD;  Location: [facility];  Service: Cardiovascular;  Laterality: N/A;   LEFT HEART CATH AND CORONARY ANGIOGRAPHY N/A 01/18/2021   Procedure: LEFT HEART CATH AND CORONARY ANGIOGRAPHY;  Surgeon: Burnell Blanks, MD;  Location: [facility] [facility];  Service: Cardiovascular;  Laterality: N/A;   LOWER EXTREMITY VENOGRAPHY Left 08/06/2021   Procedure: LOWER EXTREMITY VENOGRAPHY;  Surgeon: Cherre Robins, MD;  Location: [facility] [facility];  Service: Cardiovascular;  Laterality:  Left;   PACEMAKER IMPLANT N/A 01/19/2021   Procedure: PACEMAKER IMPLANT;  Surgeon: Evans Lance, MD;  Location: [place] [facility];  Service: Cardiovascular;  Laterality: N/A;   PERIPHERAL VASCULAR INTERVENTION  08/04/2021   Procedure: PERIPHERAL VASCULAR  INTERVENTION;  Surgeon: Serafina Mitchell, MD;  Location: [place] [facility];  Service: Cardiovascular;;   PERIPHERAL VASCULAR THROMBECTOMY  08/06/2021   Procedure: PERIPHERAL VASCULAR THROMBECTOMY;  Surgeon: Cherre Robins, MD;  Location: [place] [facility];  Service: Cardiovascular;;  Left Popliteal Vein    Medications: Reviewed  Allergies: No Known Allergies  Family history: His father had "cancer "  Social History:   He lives with his wife in [place].  He is retired Optometrist and Administrator.  He quit smoking cigarettes 2 years ago.  No alcohol use.  He was transfused when he underwent an amputation procedure last year.  No risk factor for HIV or hepatitis.  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  Physical Exam:  Blood pressure 138/70, pulse 97, temperature 98.2 F (36.8 C), temperature source Oral, resp. rate 20, height 6' (1.829 m), weight 199 lb 1.6 oz (90.3 kg), SpO2 99 %.  HEENT: Oropharynx without visible mass, neck without mass Lungs: Clear bilaterally Cardiac: Regular rate and rhythm Abdomen: No hepatosplenomegaly  Vascular: No left leg edema Lymph nodes: No cervical, supraclavicular, axillary, or inguinal nodes Musculoskeletal: Right above-knee amputation   LAB:  CBC  Lab Results  Component Value Date   WBC 9.1 06/02/2022   HGB 14.7 06/02/2022   HCT 44.5 06/02/2022   MCV 89.0 06/02/2022   PLT 618 (H) 06/02/2022   NEUTROABS 6.0 06/02/2022        CMP  Lab Results  Component Value Date   NA 136 06/02/2022   K 3.6 06/02/2022   CL 97 (L) 06/02/2022   CO2 28 06/02/2022   GLUCOSE 331 (H) 06/02/2022   BUN 19 06/02/2022   CREATININE 0.72 06/02/2022   CALCIUM 9.5 06/02/2022   PROT 6.4 (L)  06/02/2022   ALBUMIN 4.2 06/02/2022   AST 15 06/02/2022   ALT 22 06/02/2022   ALKPHOS 72 06/02/2022   BILITOT 0.3 06/02/2022   GFRNONAA >60 06/02/2022   GFRAA >60 01/08/2020        Assessment/Plan:   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   Right lower extremity and ileal caval DVT 07/29/2021-mechanical thrombectomy Bilateral lobar and segmental pulmonary embolism with evidence of borderline right heart strain 08/08/2021 CT chest 08/26/2021-negative for pulmonary embolism, previously noted pulmonary emboli no longer seen Apixaban anticoagulation, discontinued when he developed gross hematuria IVC filter placed 08/30/2021  4.  Urinary retention following amputation surgery 2023, Foley catheter in place 5.  BPH 6.  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  Hypertension 8.  Thrombocytosis  Disposition:   Douglas [name] has a complex medical history including a diagnosis of CLL.  He was diagnosed with CLL in August 2020.  He is currently maintained on at acalabrutinib.  He appears to be in clinical remission.  He is tolerating  the acalabrutinib well.  He is maintained on aspirin and Plavix after being diagnosed with peripheral vascular disease requiring a left leg stent and right AKA.  An IVC filter is in place after developing an extensive left lower extremity DVT with pulmonary emboli.  He had bleeding while on apixaban.  The plan is to continue acalabrutinib.  He has thrombocytosis of unclear etiology.  I discussed the differential diagnosis for thrombocytosis with Douglas [name].  The thrombocytosis could be related to inflammation secondary to infection with indwelling Foley catheter, iron deficiency, or  less likely essential thrombocytosis.  We will check a ferritin level when he returns in 3 months.  The platelet count has been elevated since October 2023.  He is at increased risk for infection in the setting of CLL.  I recommended an influenza, RSV, and pneumonia vaccine.  He does not wish to receive vaccines at present.  We will discuss this when he returns in 3 months.  Approximately 60 minutes were spent with the patient today.  The majority the time was used for counseling and coordination of care.   Betsy Coder, MD  06/02/2022, 2:56 PM

## 2022-06-03 ENCOUNTER — Telehealth: Payer: Self-pay

## 2022-06-03 NOTE — Telephone Encounter (Signed)
I faxed [phone] over the patient chemistry panel result to Dr. Darien Ramus office.

## 2022-06-03 NOTE — Telephone Encounter (Signed)
-----   Message from Ladell Pier, MD sent at 06/02/2022  4:48 PM EST ----- Please send chemistry panel to his primary provider, he needs evaluation for diabetes

## 2022-06-14 ENCOUNTER — Other Ambulatory Visit (HOSPITAL_COMMUNITY): Payer: Self-pay

## 2022-06-16 ENCOUNTER — Other Ambulatory Visit (HOSPITAL_COMMUNITY): Payer: Self-pay

## 2022-06-21 ENCOUNTER — Other Ambulatory Visit (HOSPITAL_COMMUNITY): Payer: Self-pay

## 2022-07-08 ENCOUNTER — Other Ambulatory Visit: Payer: Self-pay

## 2022-07-11 ENCOUNTER — Other Ambulatory Visit: Payer: Self-pay

## 2022-07-14 ENCOUNTER — Other Ambulatory Visit (HOSPITAL_COMMUNITY): Payer: Self-pay

## 2022-07-19 ENCOUNTER — Ambulatory Visit (INDEPENDENT_AMBULATORY_CARE_PROVIDER_SITE_OTHER): Payer: Medicare Other

## 2022-07-19 DIAGNOSIS — I441 Atrioventricular block, second degree: Secondary | ICD-10-CM | POA: Diagnosis not present

## 2022-07-20 LAB — CUP PACEART REMOTE DEVICE CHECK
Battery Remaining Longevity: 138 mo
Battery Voltage: 3.03 V
Brady Statistic AP VP Percent: 0.57 %
Brady Statistic AP VS Percent: 0 %
Brady Statistic AS VP Percent: 98.81 %
Brady Statistic AS VS Percent: 0.62 %
Brady Statistic RA Percent Paced: 0.68 %
Brady Statistic RV Percent Paced: 99.38 %
Date Time Interrogation Session: 20240409102542
Implantable Lead Connection Status: 753985
Implantable Lead Connection Status: 753985
Implantable Lead Implant Date: 20221011
Implantable Lead Implant Date: 20221011
Implantable Lead Location: 753859
Implantable Lead Location: 753860
Implantable Lead Model: 3830
Implantable Lead Model: 5076
Implantable Pulse Generator Implant Date: 20221011
Lead Channel Impedance Value: 361 Ohm
Lead Channel Impedance Value: 399 Ohm
Lead Channel Impedance Value: 551 Ohm
Lead Channel Impedance Value: 608 Ohm
Lead Channel Pacing Threshold Amplitude: 0.625 V
Lead Channel Pacing Threshold Amplitude: 0.625 V
Lead Channel Pacing Threshold Pulse Width: 0.4 ms
Lead Channel Pacing Threshold Pulse Width: 0.4 ms
Lead Channel Sensing Intrinsic Amplitude: 4.125 mV
Lead Channel Sensing Intrinsic Amplitude: 4.125 mV
Lead Channel Sensing Intrinsic Amplitude: 5 mV
Lead Channel Sensing Intrinsic Amplitude: 5 mV
Lead Channel Setting Pacing Amplitude: 1.5 V
Lead Channel Setting Pacing Amplitude: 2 V
Lead Channel Setting Pacing Pulse Width: 0.4 ms
Lead Channel Setting Sensing Sensitivity: 0.9 mV
Zone Setting Status: 755011
Zone Setting Status: 755011

## 2022-07-28 ENCOUNTER — Other Ambulatory Visit (HOSPITAL_COMMUNITY): Payer: Self-pay | Admitting: Urology

## 2022-07-28 DIAGNOSIS — R339 Retention of urine, unspecified: Secondary | ICD-10-CM

## 2022-07-29 NOTE — Progress Notes (Signed)
Fax received from [facility] on 07/29/22 for medical clearance/medication hold for SP tube placement to be signed by B. Randie Heinz, MD.  Provider signed on 07/29/22, form faxed back to sender on 07/29/22, verified successful, sent to scan center.

## 2022-08-04 ENCOUNTER — Telehealth (HOSPITAL_COMMUNITY): Payer: Self-pay | Admitting: Student

## 2022-08-04 ENCOUNTER — Other Ambulatory Visit (HOSPITAL_COMMUNITY): Payer: Self-pay

## 2022-08-04 NOTE — Telephone Encounter (Signed)
Patient scheduled for suprapubic catheter placement in IR 08/11/22. Patient currently takes Plavix and Aspirin daily for a history of PAD, DVT and lower extremity ischemic events.   The patient's wife Nicole Cella called IR today stating that they wish to cancel the procedure for suprapubic catheter placement. She stated the patient is unable to hold his blood thinners due to the risks of further ischemia in his legs. She said after much debate/discussion they decided the risks outweigh the benefits.   The order will be deleted. I encouraged Nicole Cella to contact the urology team to notify them of their decision.  Alwyn Ren, [place] [phone] 08/04/2022, 1:04 PM

## 2022-08-09 ENCOUNTER — Other Ambulatory Visit (HOSPITAL_COMMUNITY): Payer: Self-pay

## 2022-08-11 ENCOUNTER — Encounter (HOSPITAL_COMMUNITY): Payer: Self-pay

## 2022-08-11 ENCOUNTER — Ambulatory Visit (HOSPITAL_COMMUNITY): Admission: RE | Admit: 2022-08-11 | Payer: Medicare Other | Source: Ambulatory Visit

## 2022-08-26 NOTE — Progress Notes (Signed)
Remote pacemaker transmission.   

## 2022-08-29 ENCOUNTER — Other Ambulatory Visit: Payer: Self-pay

## 2022-08-29 ENCOUNTER — Other Ambulatory Visit (HOSPITAL_COMMUNITY): Payer: Self-pay

## 2022-08-31 ENCOUNTER — Other Ambulatory Visit (HOSPITAL_COMMUNITY): Payer: Self-pay

## 2022-09-02 ENCOUNTER — Inpatient Hospital Stay (HOSPITAL_BASED_OUTPATIENT_CLINIC_OR_DEPARTMENT_OTHER): Payer: Medicare Other | Admitting: Oncology

## 2022-09-02 ENCOUNTER — Inpatient Hospital Stay: Payer: Medicare Other | Attending: Oncology

## 2022-09-02 VITALS — BP 124/65 | HR 81 | Temp 98.2°F | Resp 18 | Wt 196.0 lb

## 2022-09-02 DIAGNOSIS — E1159 Type 2 diabetes mellitus with other circulatory complications: Secondary | ICD-10-CM | POA: Diagnosis not present

## 2022-09-02 DIAGNOSIS — C9110 Chronic lymphocytic leukemia of B-cell type not having achieved remission: Secondary | ICD-10-CM | POA: Diagnosis present

## 2022-09-02 DIAGNOSIS — D75839 Thrombocytosis, unspecified: Secondary | ICD-10-CM | POA: Diagnosis not present

## 2022-09-02 DIAGNOSIS — I10 Essential (primary) hypertension: Secondary | ICD-10-CM | POA: Diagnosis not present

## 2022-09-02 LAB — CBC WITH DIFFERENTIAL (CANCER CENTER ONLY)
Abs Immature Granulocytes: 0.06 10*3/uL (ref 0.00–0.07)
Basophils Absolute: 0.1 10*3/uL (ref 0.0–0.1)
Basophils Relative: 1 %
Eosinophils Absolute: 0.1 10*3/uL (ref 0.0–0.5)
Eosinophils Relative: 1 %
HCT: 47 % (ref 39.0–52.0)
Hemoglobin: 15.3 g/dL (ref 13.0–17.0)
Immature Granulocytes: 1 %
Lymphocytes Relative: 25 %
Lymphs Abs: 2.4 10*3/uL (ref 0.7–4.0)
MCH: 27.9 pg (ref 26.0–34.0)
MCHC: 32.6 g/dL (ref 30.0–36.0)
MCV: 85.8 fL (ref 80.0–100.0)
Monocytes Absolute: 0.5 10*3/uL (ref 0.1–1.0)
Monocytes Relative: 5 %
Neutro Abs: 6.6 10*3/uL (ref 1.7–7.7)
Neutrophils Relative %: 67 %
Platelet Count: 658 10*3/uL — ABNORMAL HIGH (ref 150–400)
RBC: 5.48 MIL/uL (ref 4.22–5.81)
RDW: 16 % — ABNORMAL HIGH (ref 11.5–15.5)
WBC Count: 9.7 10*3/uL (ref 4.0–10.5)
nRBC: 0 % (ref 0.0–0.2)

## 2022-09-02 LAB — CMP (CANCER CENTER ONLY)
ALT: 17 U/L (ref 0–44)
AST: 14 U/L — ABNORMAL LOW (ref 15–41)
Albumin: 4.3 g/dL (ref 3.5–5.0)
Alkaline Phosphatase: 74 U/L (ref 38–126)
Anion gap: 9 (ref 5–15)
BUN: 15 mg/dL (ref 8–23)
CO2: 28 mmol/L (ref 22–32)
Calcium: 9.3 mg/dL (ref 8.9–10.3)
Chloride: 95 mmol/L — ABNORMAL LOW (ref 98–111)
Creatinine: 0.65 mg/dL (ref 0.61–1.24)
GFR, Estimated: >60 mL/min (ref >60)
Glucose, Bld: 266 mg/dL — ABNORMAL HIGH (ref 70–99)
Potassium: 4.1 mmol/L (ref 3.5–5.1)
Sodium: 132 mmol/L — ABNORMAL LOW (ref 135–145)
Total Bilirubin: 0.6 mg/dL (ref 0.3–1.2)
Total Protein: 6.6 g/dL (ref 6.5–8.1)

## 2022-09-02 NOTE — Progress Notes (Deleted)
  [facility] OFFICE PROGRESS NOTE   Diagnosis: CLL  Disposition: ***  Thornton Papas, MD  09/02/2022  11:18 AM

## 2022-09-02 NOTE — Progress Notes (Signed)
  [facility] OFFICE PROGRESS NOTE   Diagnosis: CLL  INTERVAL HISTORY:   Douglas [name] returns as scheduled.  He feels well.  He continues acalabrutinib.  No palpable lymph nodes.  No bleeding.  He has been prescribed metformin to treat diabetes.  He has not started the metformin.  Objective:  Vital signs in last 24 hours:  Blood pressure 124/65, pulse 81, temperature 98.2 F (36.8 C), temperature source Oral, resp. rate 18, weight 196 lb (88.9 kg), SpO2 98 %.    HEENT: No thrush Lymphatics: No cervical, supraclavicular, axillary, or inguinal nodes Resp: Lungs clear bilaterally Cardio: Regular rate and rhythm GI: No hepatosplenomegaly Vascular: No left leg edema   Lab Results:  Lab Results  Component Value Date   WBC 9.7 09/02/2022   HGB 15.3 09/02/2022   HCT 47.0 09/02/2022   MCV 85.8 09/02/2022   PLT 658 (H) 09/02/2022   NEUTROABS 6.6 09/02/2022    CMP  Lab Results  Component Value Date   NA 132 (L) 09/02/2022   K 4.1 09/02/2022   CL 95 (L) 09/02/2022   CO2 28 09/02/2022   GLUCOSE 266 (H) 09/02/2022   BUN 15 09/02/2022   CREATININE 0.65 09/02/2022   CALCIUM 9.3 09/02/2022   PROT 6.6 09/02/2022   ALBUMIN 4.3 09/02/2022   AST 14 (L) 09/02/2022   ALT 17 09/02/2022   ALKPHOS 74 09/02/2022   BILITOT 0.6 09/02/2022   GFRNONAA >60 09/02/2022   GFRAA >60 01/08/2020    Medications: I have reviewed the patient's current medications.   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   Right lower extremity and ileal caval DVT 07/29/2021-mechanical thrombectomy Bilateral lobar and segmental pulmonary embolism with evidence of borderline right heart strain  08/08/2021 CT chest 08/26/2021-negative for pulmonary embolism, previously noted pulmonary emboli no longer seen Apixaban anticoagulation, discontinued when he developed gross hematuria IVC filter placed 08/30/2021  4.  Urinary retention following amputation surgery 2023, Foley catheter in place 5.  BPH 6.  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  Hypertension 8.  Thrombocytosis   Disposition: Douglas [name] is in clinical remission from CLL.  He is tolerating the acalabrutinib well.  The platelets remain mildly elevated.  The thrombocytosis could be related to iron deficiency, inflammation, or less likely a myeloproliferative disorder.  We will check a ferritin level and myeloproliferative panel when he returns in 3 months.  He will continue acalabrutinib.  He will follow-up with Dr. Kateri Plummer for diabetes management.  He plans to discuss the pneumococcal 20 and RSV vaccines with Dr. Kateri Plummer.  Thornton Papas, MD  09/02/2022  1:41 PM

## 2022-09-19 ENCOUNTER — Other Ambulatory Visit: Payer: Self-pay | Admitting: Oncology

## 2022-09-19 ENCOUNTER — Other Ambulatory Visit (HOSPITAL_COMMUNITY): Payer: Self-pay

## 2022-09-19 DIAGNOSIS — C9110 Chronic lymphocytic leukemia of B-cell type not having achieved remission: Secondary | ICD-10-CM

## 2022-09-23 ENCOUNTER — Other Ambulatory Visit: Payer: Self-pay

## 2022-09-23 ENCOUNTER — Other Ambulatory Visit: Payer: Self-pay | Admitting: *Deleted

## 2022-09-23 ENCOUNTER — Other Ambulatory Visit (HOSPITAL_COMMUNITY): Payer: Self-pay

## 2022-09-23 DIAGNOSIS — C9110 Chronic lymphocytic leukemia of B-cell type not having achieved remission: Secondary | ICD-10-CM

## 2022-09-23 MED ORDER — CALQUENCE 100 MG PO TABS
100.0000 mg | ORAL_TABLET | Freq: Two times a day (BID) | ORAL | 2 refills | Status: DC
  Filled 2022-09-23: qty 60, 30d supply, fill #0
  Filled 2022-10-18 – 2022-10-20 (×2): qty 60, 30d supply, fill #1
  Filled 2022-11-11: qty 60, 30d supply, fill #2

## 2022-09-23 NOTE — Telephone Encounter (Signed)
Notified by oral oncology team that refill on Calquence is needed.

## 2022-10-18 ENCOUNTER — Other Ambulatory Visit (HOSPITAL_COMMUNITY): Payer: Self-pay

## 2022-10-18 ENCOUNTER — Ambulatory Visit (INDEPENDENT_AMBULATORY_CARE_PROVIDER_SITE_OTHER): Payer: Medicare Other

## 2022-10-18 DIAGNOSIS — I441 Atrioventricular block, second degree: Secondary | ICD-10-CM | POA: Diagnosis not present

## 2022-10-18 LAB — CUP PACEART REMOTE DEVICE CHECK
Battery Remaining Longevity: 135 mo
Battery Voltage: 3.03 V
Brady Statistic AP VP Percent: 1.35 %
Brady Statistic AP VS Percent: 0 %
Brady Statistic AS VP Percent: 97.24 %
Brady Statistic AS VS Percent: 1.41 %
Brady Statistic RA Percent Paced: 1.68 %
Brady Statistic RV Percent Paced: 98.59 %
Date Time Interrogation Session: 20240709094841
Implantable Lead Connection Status: 753985
Implantable Lead Connection Status: 753985
Implantable Lead Implant Date: 20221011
Implantable Lead Implant Date: 20221011
Implantable Lead Location: 753859
Implantable Lead Location: 753860
Implantable Lead Model: 3830
Implantable Lead Model: 5076
Implantable Pulse Generator Implant Date: 20221011
Lead Channel Impedance Value: 399 Ohm
Lead Channel Impedance Value: 399 Ohm
Lead Channel Impedance Value: 513 Ohm
Lead Channel Impedance Value: 551 Ohm
Lead Channel Pacing Threshold Amplitude: 0.625 V
Lead Channel Pacing Threshold Amplitude: 0.75 V
Lead Channel Pacing Threshold Pulse Width: 0.4 ms
Lead Channel Pacing Threshold Pulse Width: 0.4 ms
Lead Channel Sensing Intrinsic Amplitude: 3.75 mV
Lead Channel Sensing Intrinsic Amplitude: 3.75 mV
Lead Channel Sensing Intrinsic Amplitude: 6.875 mV
Lead Channel Sensing Intrinsic Amplitude: 6.875 mV
Lead Channel Setting Pacing Amplitude: 1.5 V
Lead Channel Setting Pacing Amplitude: 2 V
Lead Channel Setting Pacing Pulse Width: 0.4 ms
Lead Channel Setting Sensing Sensitivity: 0.9 mV
Zone Setting Status: 755011
Zone Setting Status: 755011

## 2022-10-19 ENCOUNTER — Other Ambulatory Visit (HOSPITAL_COMMUNITY): Payer: Self-pay

## 2022-10-20 ENCOUNTER — Other Ambulatory Visit: Payer: Self-pay

## 2022-10-28 IMAGING — CT CT ANGIO CHEST
2 of 6 series · 18 of 46 positions shown · IV contrast (agent unspecified)
Comparison: None.

CLINICAL DATA: Status post left lower extremity angiogram with left
SFA stent

EXAM:
CT ANGIOGRAPHY CHEST WITH CONTRAST
TECHNIQUE: Multidetector CT imaging of the chest was performed using the
standard protocol during bolus administration of intravenous
contrast. Multiplanar CT image reconstructions and MIPs were
obtained to evaluate the vascular anatomy.

[Series 6: thins · axial · 0.89mm/px · z∈[+1137,+1446]mm · 15 of 339 slices shown]
[im 15/339  lung]
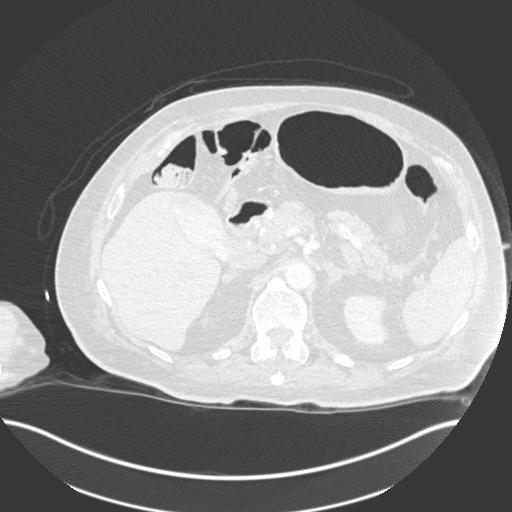
[im 45/339  soft-tissue]
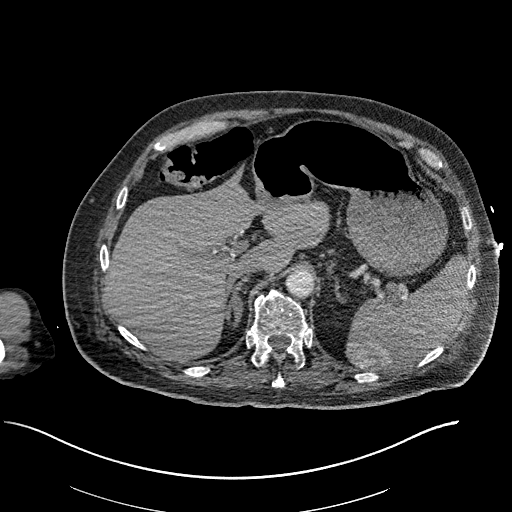
[im 59/339  lung]
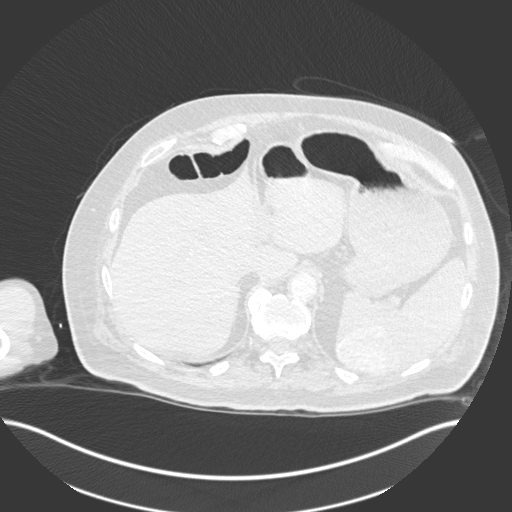
[im 89/339  soft-tissue]
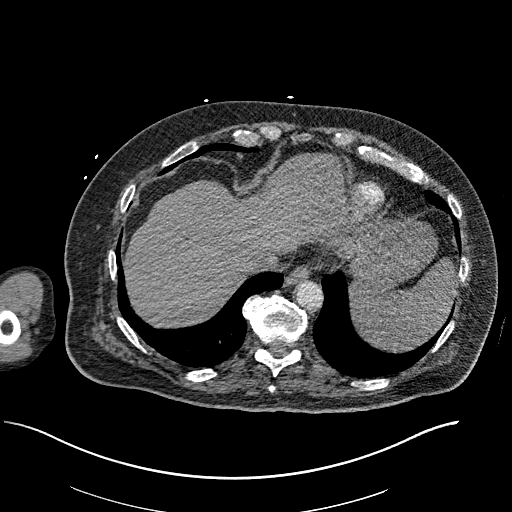
[im 103/339  lung]
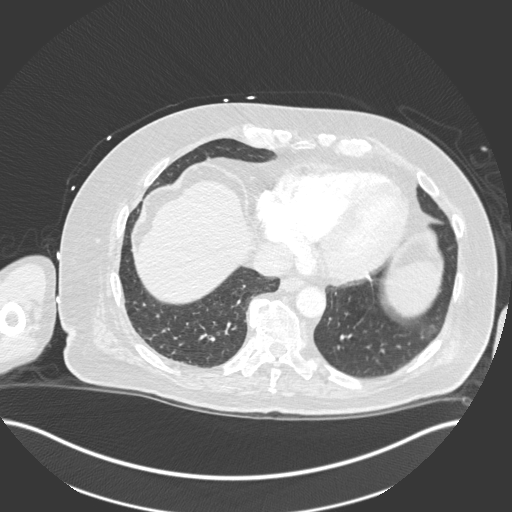
[im 133/339  soft-tissue]
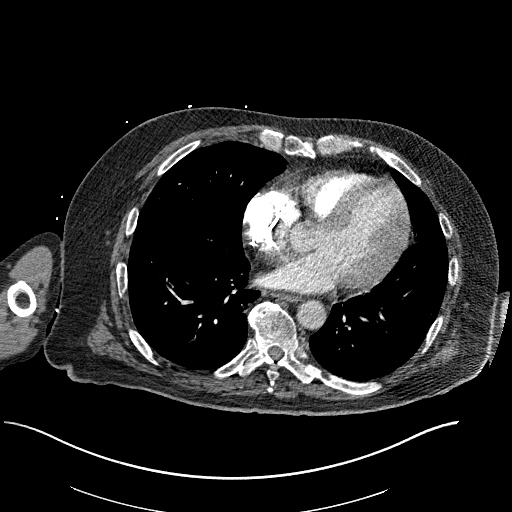
[im 147/339  lung]
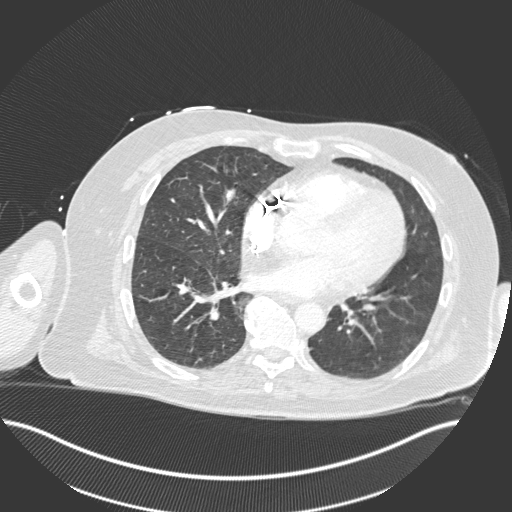
[im 177/339  soft-tissue]
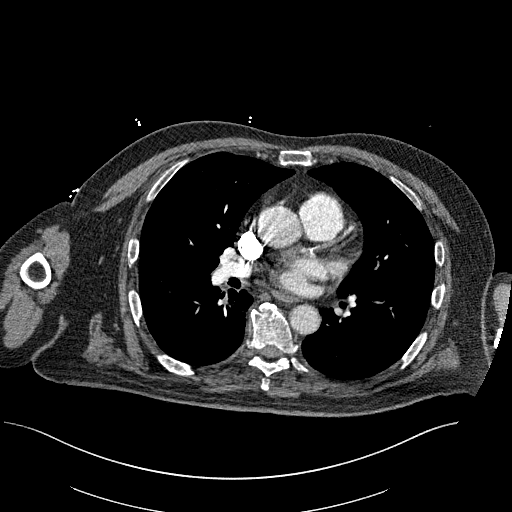
[im 192/339  lung]
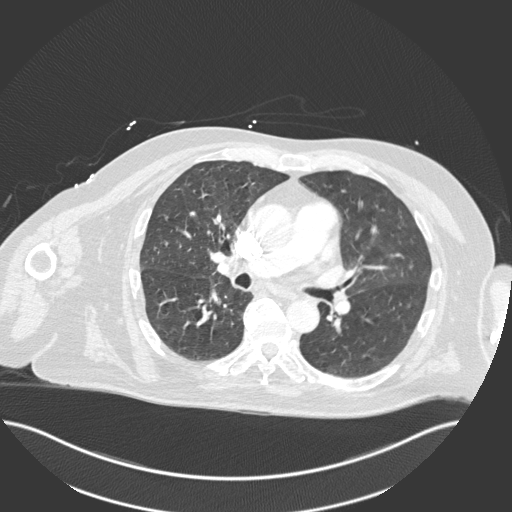
[im 206/339  soft-tissue]
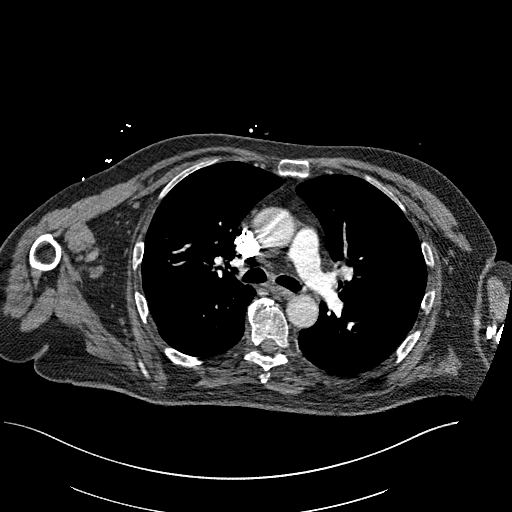
[im 236/339  lung]
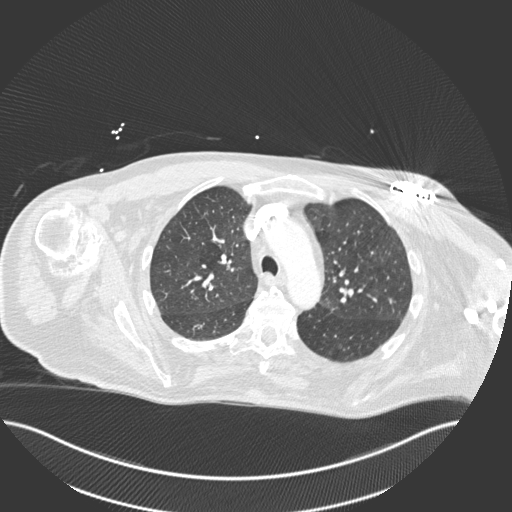
[im 250/339  soft-tissue]
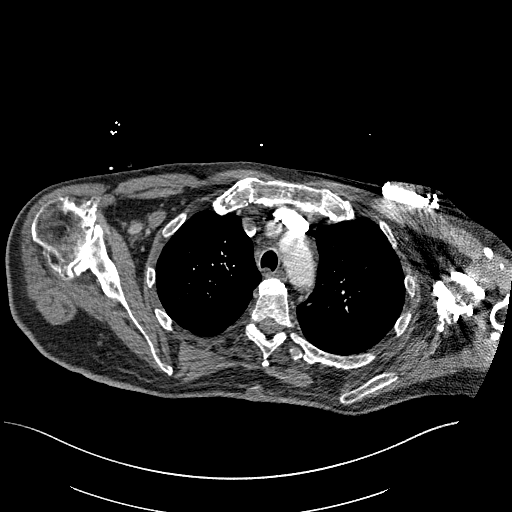
[im 280/339  lung]
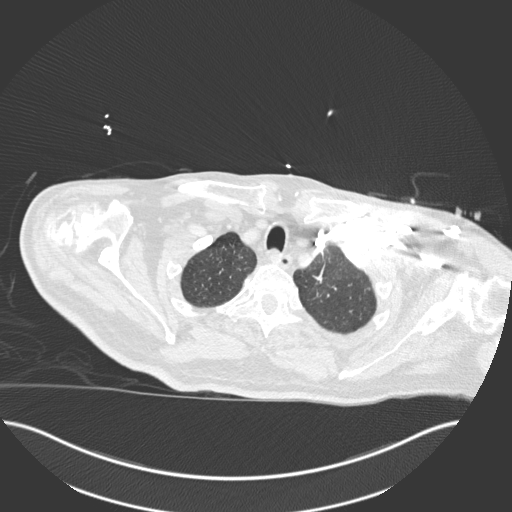
[im 294/339  soft-tissue]
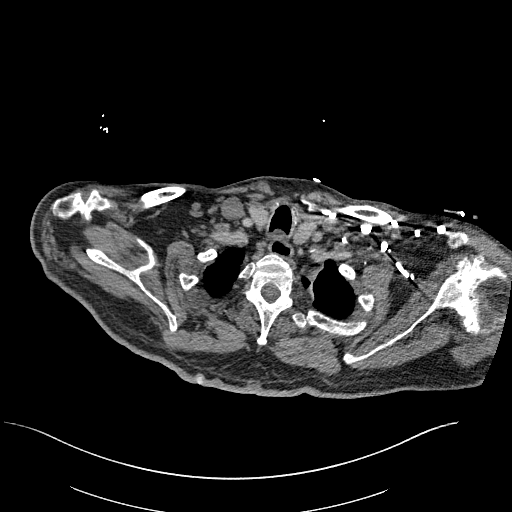
[im 324/339  lung]
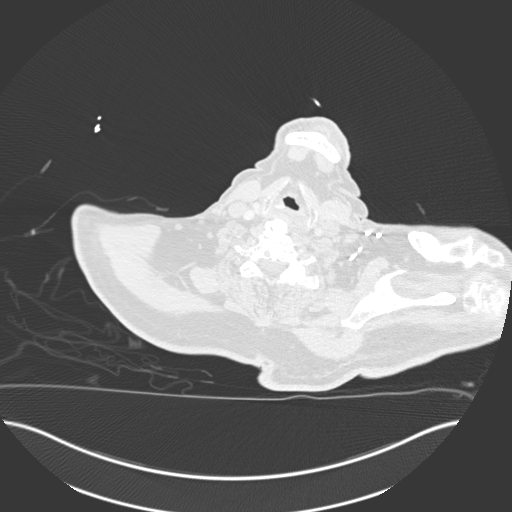

[Series 8: coronal mpr · coronal · 0.67mm/px · 3 of 151 slices shown]
[im 38/151  soft-tissue]
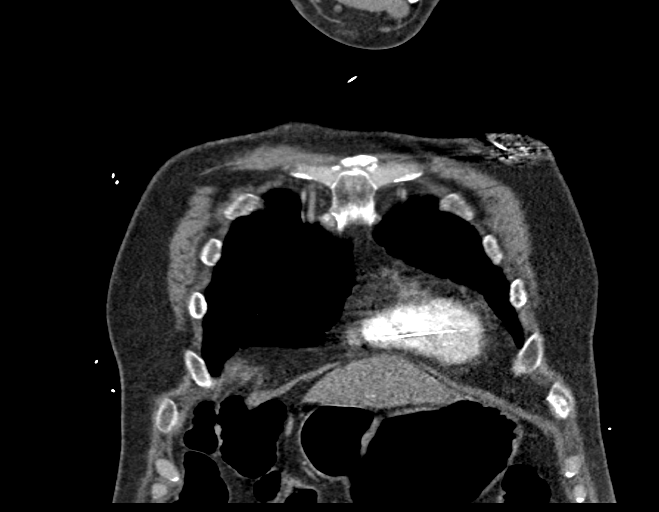
[im 76/151  soft-tissue]
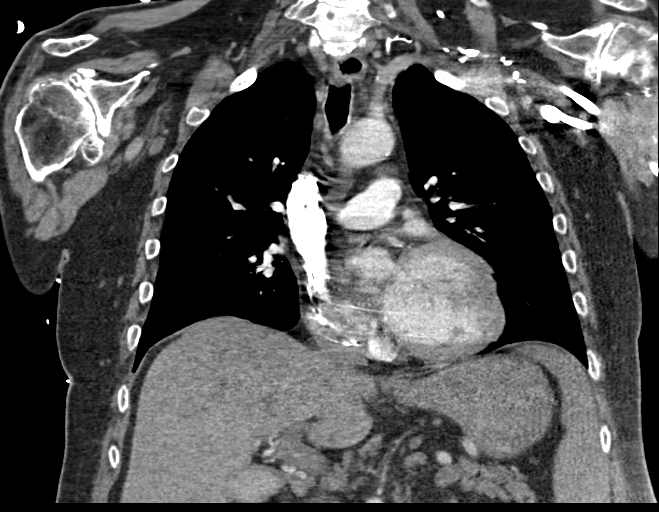
[im 113/151  soft-tissue]
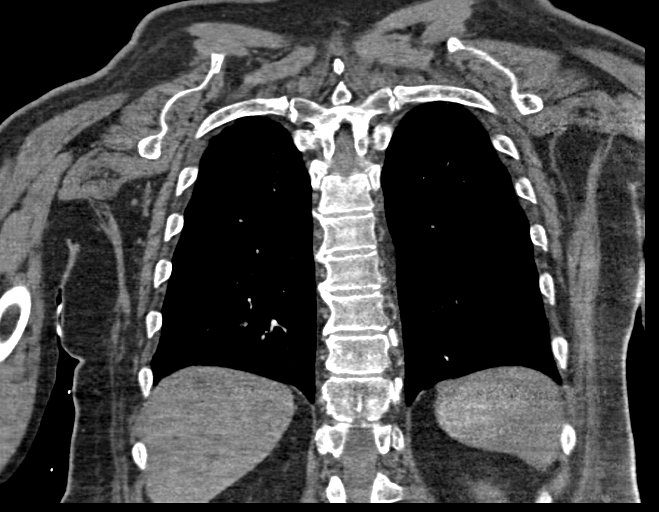

[18 of 46 positions shown; findings below may reference images not displayed]

RADIATION DOSE REDUCTION: This exam was performed according to the
departmental dose-optimization program which includes automated
exposure control, adjustment of the mA and/or kV according to
patient size and/or use of iterative reconstruction technique.

CONTRAST:  65mL OMNIPAQUE IOHEXOL 350 MG/ML SOLN
FINDINGS: Cardiovascular: Satisfactory opacification of the pulmonary arteries
to the segmental level. No evidence of pulmonary embolism. Normal
heart size. No pericardial effusion. Thoracic aortic
atherosclerosis.

Mediastinum/Nodes: No enlarged mediastinal, hilar, or axillary lymph
nodes. Thyroid gland, trachea, and esophagus demonstrate no
significant findings.

Lungs/Pleura: Lungs are clear. No pleural effusion or pneumothorax.

Upper Abdomen: Heterogeneous enhancing 4.2 cm masslike area in the
spleen of indeterminate etiology.

Musculoskeletal: No acute osseous abnormality. No aggressive osseous
lesion. Anterior bridging osteophytes of the thoracolumbar spine as
can be seen with diffuse idiopathic skeletal hyperostosis severe
osteoarthritis of the glenohumeral joints bilaterally.

Review of the MIP images confirms the above findings.
IMPRESSION: 1. No pulmonary embolism.
2. No acute cardiopulmonary findings.
3. Heterogeneous enhancing 4.2 cm masslike area in the spleen of
indeterminate etiology. Recommend further evaluation with an MRI of
the abdomen without and with intravenous contrast for further
characterization.
4. Aortic Atherosclerosis ([id number].M).

## 2022-10-30 IMAGING — DX DG CHEST 1V PORT
1 series · 1 of 1 positions shown · non-contrast
Comparison: 01/20/2021.

CLINICAL DATA: Dyspnea.

EXAM:
PORTABLE CHEST 1 VIEW

[chest]
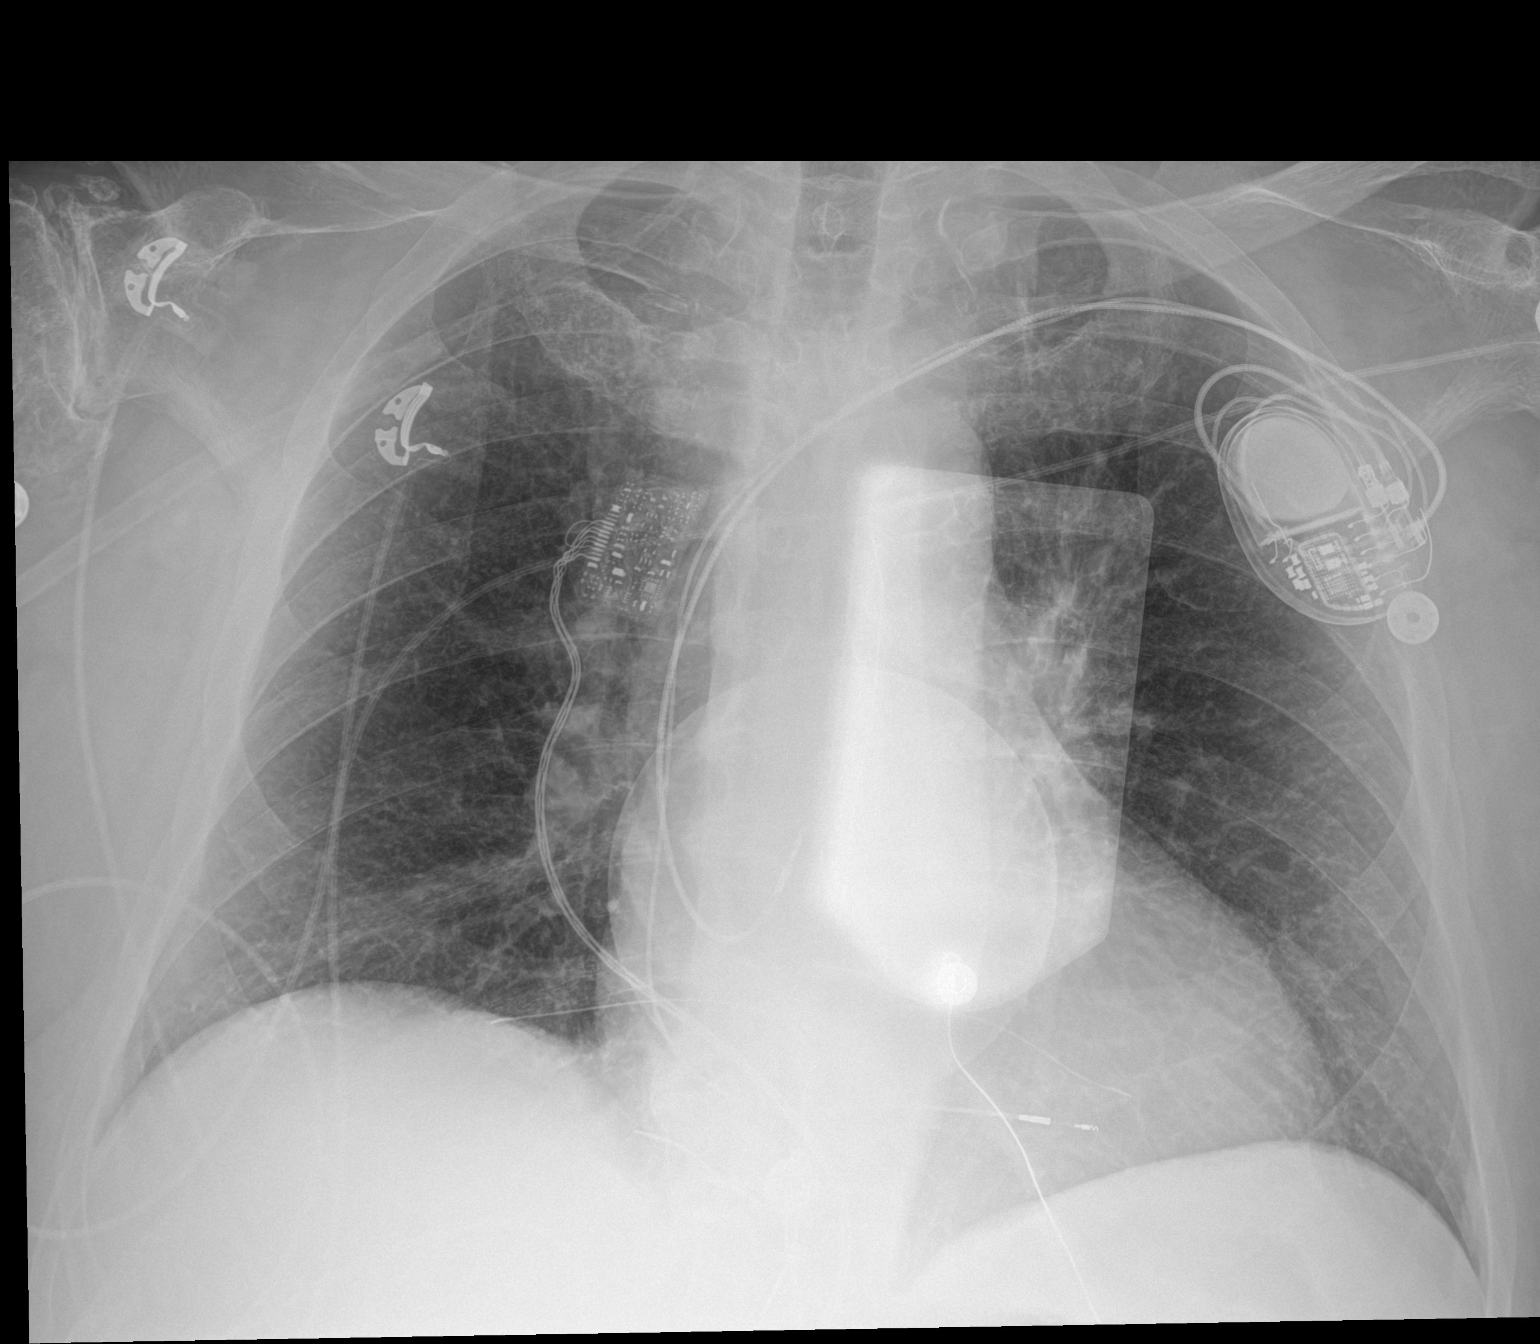

[1 of 1 positions shown; findings below may reference images not displayed]

FINDINGS: Left chest wall pacer device noted with leads in the right atrial
appendage and right ventricle. Heart size is normal. No pleural
effusion or edema. No airspace opacities identified. Marked
degenerative changes noted in the right glenohumeral joint.
IMPRESSION: No acute cardiopulmonary abnormalities.

## 2022-10-31 IMAGING — CT CT ANGIO CHEST
2 of 6 series · 15 of 36 positions shown · IV contrast (agent unspecified)
Comparison: 08/05/2021
COMPARISON: 08/05/2021

Addendum:
CLINICAL DATA: Pulmonary embolism suspected.

EXAM:
CT ANGIOGRAPHY CHEST WITH CONTRAST
TECHNIQUE: Multidetector CT imaging of the chest was performed using the
standard protocol during bolus administration of intravenous
contrast. Multiplanar CT image reconstructions and MIPs were
obtained to evaluate the vascular anatomy.

[Series 7: pe thins · axial · 0.79mm/px · z∈[+1104,+1382]mm · 14 of 443 slices shown]
[im 23/443  lung]
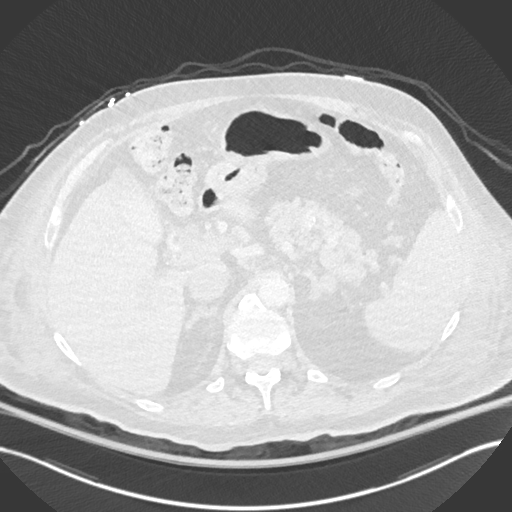
[im 67/443  mediastinal]
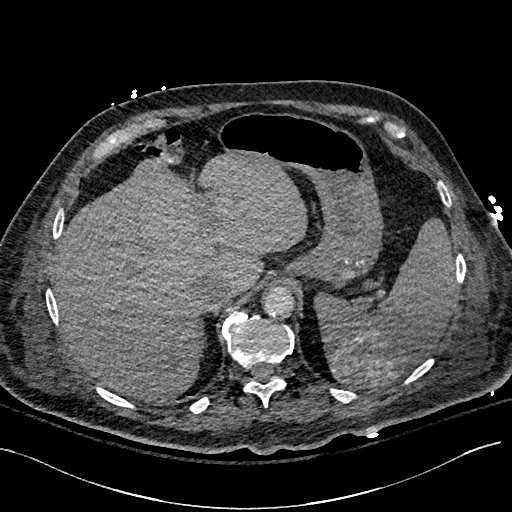
[im 89/443  lung]
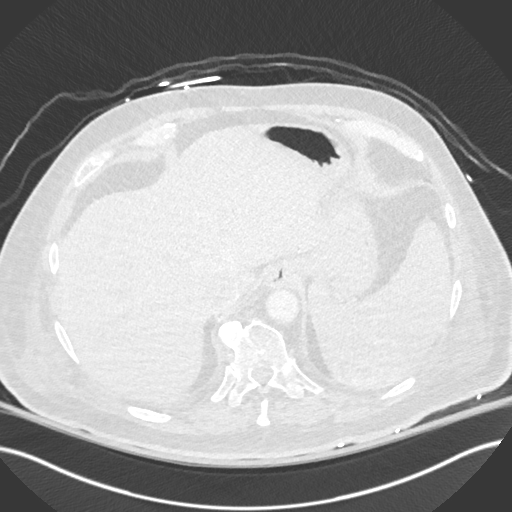
[im 111/443  mediastinal]
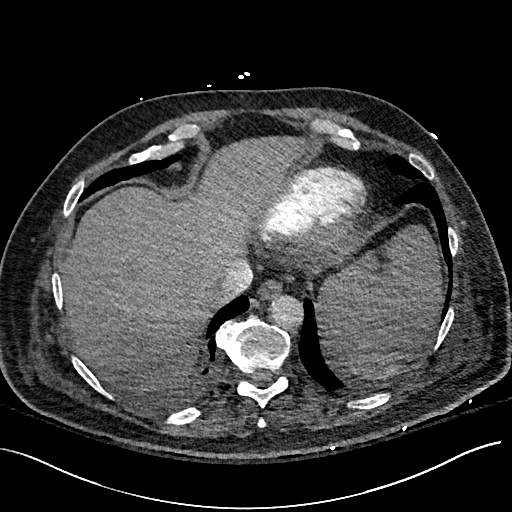
[im 155/443  lung]
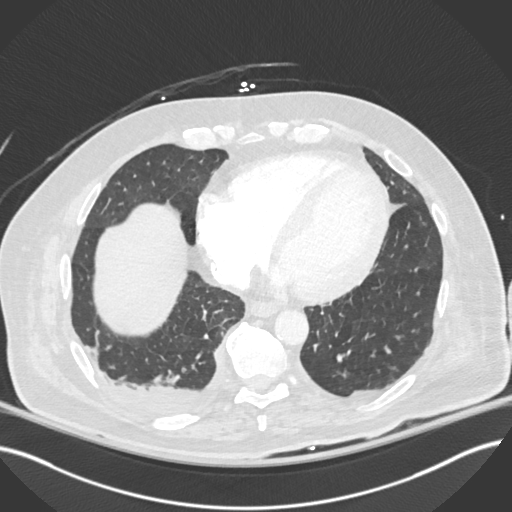
[im 177/443  mediastinal]
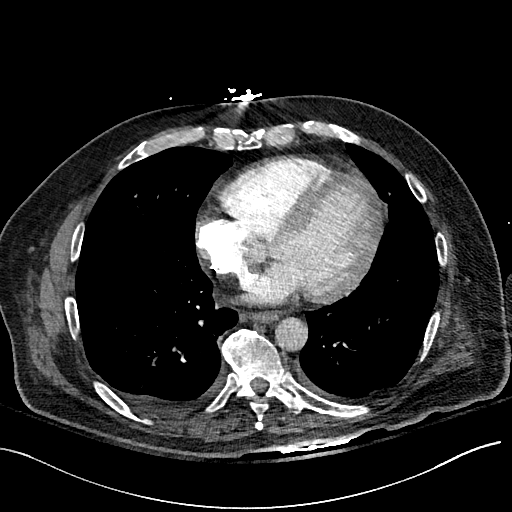
[im 199/443  lung]
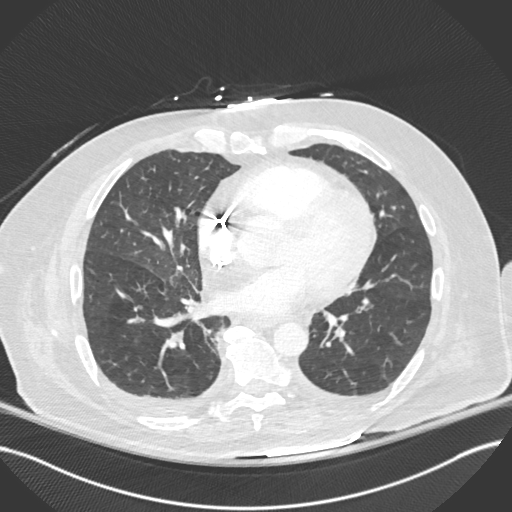
[im 244/443  mediastinal]
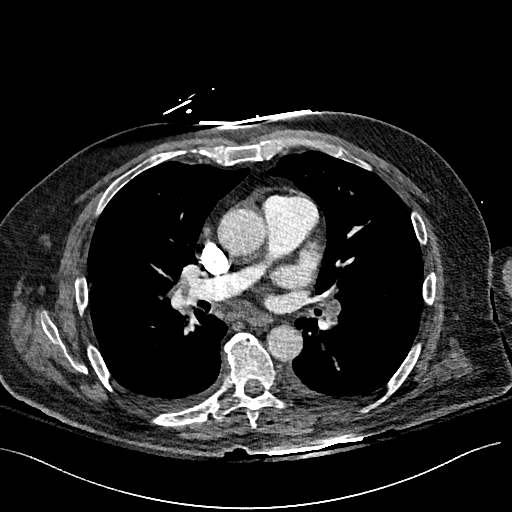
[im 266/443  lung]
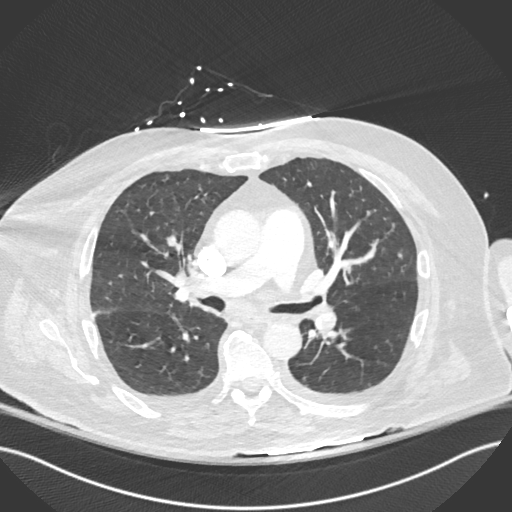
[im 288/443  mediastinal]
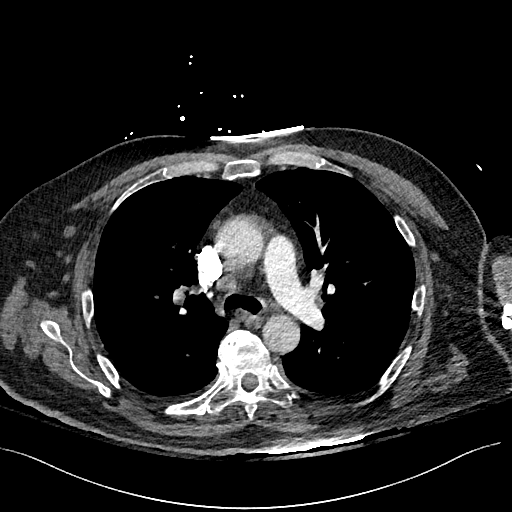
[im 332/443  lung]
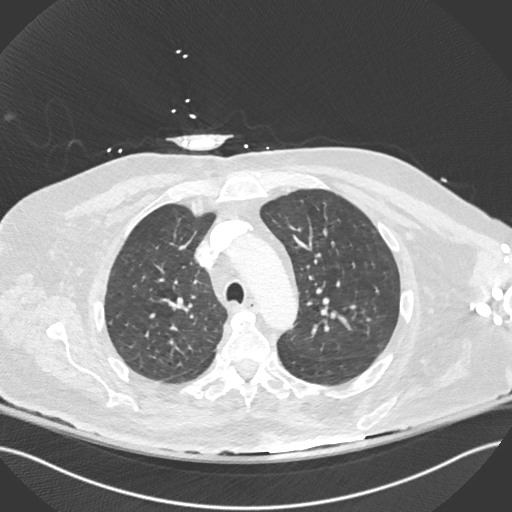
[im 354/443  mediastinal]
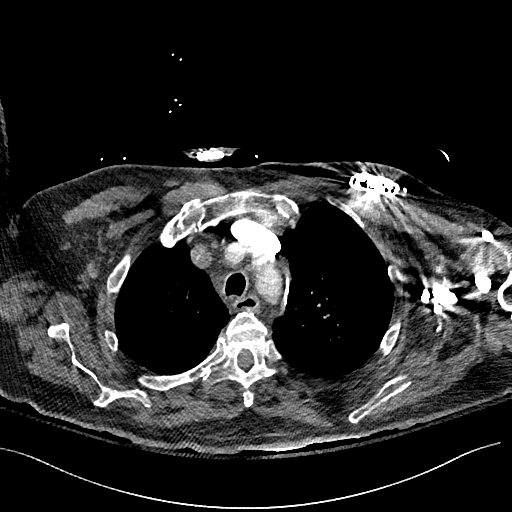
[im 376/443  lung]
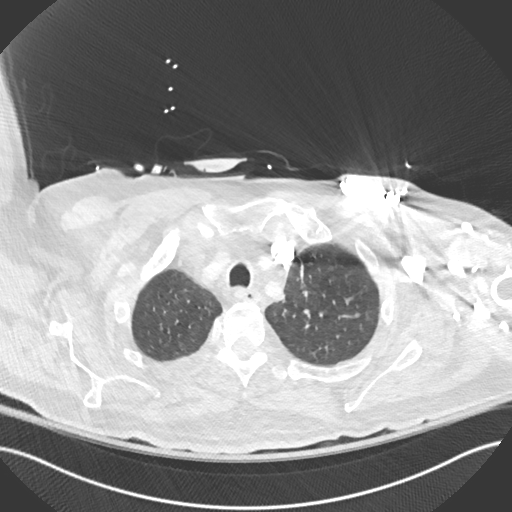
[im 420/443  mediastinal]
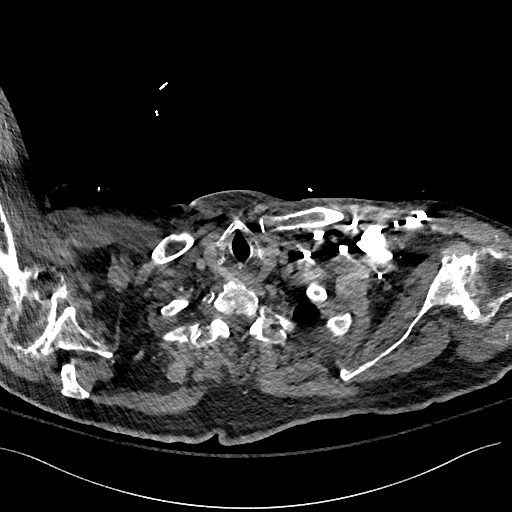

[Series 10: pe 2mm cor · coronal · 0.61mm/px · 1 of 151 slices shown]
[im 76/151  mediastinal]
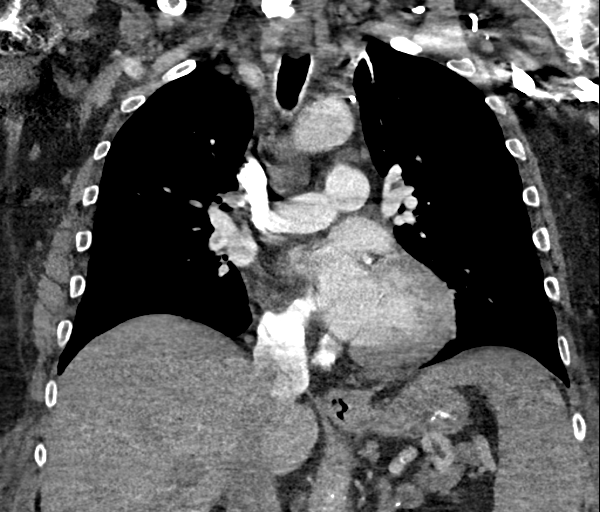

[15 of 36 positions shown; findings below may reference images not displayed]

RADIATION DOSE REDUCTION: This exam was performed according to the
departmental dose-optimization program which includes automated
exposure control, adjustment of the mA and/or kV according to
patient size and/or use of iterative reconstruction technique.

CONTRAST:  100mL OMNIPAQUE IOHEXOL 350 MG/ML SOLN
FINDINGS: Cardiovascular: The heart size is normal. No substantial pericardial
effusion. Coronary artery calcification is evident. Mild
atherosclerotic calcification is noted in the wall of the thoracic
aorta. Lobar and segmental pulmonary embolus is identified in the
left lower lobe. Lobar nonocclusive pulmonary embolus identified in
the right upper lobe extending into segmental and subsegmental
branches. There is pulmonary embolus in the right middle lobe and
lobar right lower lobe pulmonary arteries. Right ventricle measures
4.7 cm. Left ventricular diameter is 4.8 cm.

Mediastinum/Nodes: No mediastinal lymphadenopathy. There is no hilar
lymphadenopathy. The esophagus has normal imaging features. There is
no axillary lymphadenopathy.

Lungs/Pleura: 4 mm right lower lobe nodule on 69/6 is stable. Stable
scarring medial left apex. Additional scattered tiny nodules noted
left lung. No new suspicious pulmonary nodule or mass. Dependent
atelectasis in the lower lobes right greater than left, is new in
the interval. Tiny bilateral pleural effusions evident.

Upper Abdomen: Hypervascular splenic lesion is unchanged.

Musculoskeletal: No worrisome lytic or sclerotic osseous
abnormality. Degenerative changes noted in both shoulders.

Review of the MIP images confirms the above findings.
IMPRESSION: 1. Bilateral lobar and segmental pulmonary embolus with CT evidence
of borderline right heart strain (RV/LV Ratio = 0.97) consistent
with at least submassive (intermediate risk) PE. The presence of
right heart strain has been associated with an increased risk of
morbidity and mortality. Please refer to the "PE Focused" order set
in [REDACTED].
2. Tiny bilateral pleural effusions with dependent atelectasis in
the lower lobes right greater than left.
3. Scattered bilateral tiny pulmonary nodules measuring up to 4-5
mm. No follow-up needed if patient is low-risk (and has no known or
suspected primary neoplasm). Non-contrast chest CT can be considered
in 12 months if patient is high-risk. This recommendation follows
the consensus statement: Guidelines for Management of Incidental
Pulmonary Nodules Detected on CT Images: From the [HOSPITAL]
4. Aortic Atherosclerosis (OTU77-GBY.Y).

ADDENDUM:
Critical Value/emergent results were called by telephone at the time
of interpretation on 08/08/2021 at [DATE] to provider ATSBHA
JIM, DREY , who verbally acknowledged these results.

*** End of Addendum ***
RADIATION DOSE REDUCTION: This exam was performed according to the
departmental dose-optimization program which includes automated
exposure control, adjustment of the mA and/or kV according to
patient size and/or use of iterative reconstruction technique.

CONTRAST:  100mL OMNIPAQUE IOHEXOL 350 MG/ML SOLN
FINDINGS: Cardiovascular: The heart size is normal. No substantial pericardial
effusion. Coronary artery calcification is evident. Mild
atherosclerotic calcification is noted in the wall of the thoracic
aorta. Lobar and segmental pulmonary embolus is identified in the
left lower lobe. Lobar nonocclusive pulmonary embolus identified in
the right upper lobe extending into segmental and subsegmental
branches. There is pulmonary embolus in the right middle lobe and
lobar right lower lobe pulmonary arteries. Right ventricle measures
4.7 cm. Left ventricular diameter is 4.8 cm.

Mediastinum/Nodes: No mediastinal lymphadenopathy. There is no hilar
lymphadenopathy. The esophagus has normal imaging features. There is
no axillary lymphadenopathy.

Lungs/Pleura: 4 mm right lower lobe nodule on 69/6 is stable. Stable
scarring medial left apex. Additional scattered tiny nodules noted
left lung. No new suspicious pulmonary nodule or mass. Dependent
atelectasis in the lower lobes right greater than left, is new in
the interval. Tiny bilateral pleural effusions evident.

Upper Abdomen: Hypervascular splenic lesion is unchanged.

Musculoskeletal: No worrisome lytic or sclerotic osseous
abnormality. Degenerative changes noted in both shoulders.

Review of the MIP images confirms the above findings.
IMPRESSION: 1. Bilateral lobar and segmental pulmonary embolus with CT evidence
of borderline right heart strain (RV/LV Ratio = 0.97) consistent
with at least submassive (intermediate risk) PE. The presence of
right heart strain has been associated with an increased risk of
morbidity and mortality. Please refer to the "PE Focused" order set
in [REDACTED].
2. Tiny bilateral pleural effusions with dependent atelectasis in
the lower lobes right greater than left.
3. Scattered bilateral tiny pulmonary nodules measuring up to 4-5
mm. No follow-up needed if patient is low-risk (and has no known or
suspected primary neoplasm). Non-contrast chest CT can be considered
in 12 months if patient is high-risk. This recommendation follows
the consensus statement: Guidelines for Management of Incidental
Pulmonary Nodules Detected on CT Images: From the [HOSPITAL]
4. Aortic Atherosclerosis (OTU77-GBY.Y).

## 2022-11-10 NOTE — Progress Notes (Signed)
Remote pacemaker transmission.   

## 2022-11-11 ENCOUNTER — Other Ambulatory Visit: Payer: Self-pay

## 2022-11-11 ENCOUNTER — Other Ambulatory Visit (HOSPITAL_COMMUNITY): Payer: Self-pay

## 2022-11-21 ENCOUNTER — Other Ambulatory Visit (HOSPITAL_COMMUNITY): Payer: Self-pay

## 2022-12-02 ENCOUNTER — Inpatient Hospital Stay: Payer: Medicare Other

## 2022-12-02 ENCOUNTER — Inpatient Hospital Stay: Payer: Medicare Other | Attending: Oncology | Admitting: Oncology

## 2022-12-02 VITALS — BP 126/46 | HR 74 | Temp 98.2°F | Resp 18 | Ht 72.0 in | Wt 197.0 lb

## 2022-12-02 DIAGNOSIS — C9110 Chronic lymphocytic leukemia of B-cell type not having achieved remission: Secondary | ICD-10-CM

## 2022-12-02 DIAGNOSIS — D75839 Thrombocytosis, unspecified: Secondary | ICD-10-CM | POA: Insufficient documentation

## 2022-12-02 LAB — CBC WITH DIFFERENTIAL (CANCER CENTER ONLY)
Abs Immature Granulocytes: 0.05 10*3/uL (ref 0.00–0.07)
Basophils Absolute: 0.2 10*3/uL — ABNORMAL HIGH (ref 0.0–0.1)
Basophils Relative: 1 %
Eosinophils Absolute: 0.1 10*3/uL (ref 0.0–0.5)
Eosinophils Relative: 1 %
HCT: 48.6 % (ref 39.0–52.0)
Hemoglobin: 15.9 g/dL (ref 13.0–17.0)
Immature Granulocytes: 1 %
Lymphocytes Relative: 19 %
Lymphs Abs: 2.1 10*3/uL (ref 0.7–4.0)
MCH: 26.8 pg (ref 26.0–34.0)
MCHC: 32.7 g/dL (ref 30.0–36.0)
MCV: 82 fL (ref 80.0–100.0)
Monocytes Absolute: 0.5 10*3/uL (ref 0.1–1.0)
Monocytes Relative: 5 %
Neutro Abs: 7.9 10*3/uL — ABNORMAL HIGH (ref 1.7–7.7)
Neutrophils Relative %: 73 %
Platelet Count: 696 10*3/uL — ABNORMAL HIGH (ref 150–400)
RBC: 5.93 MIL/uL — ABNORMAL HIGH (ref 4.22–5.81)
RDW: 17 % — ABNORMAL HIGH (ref 11.5–15.5)
WBC Count: 10.8 10*3/uL — ABNORMAL HIGH (ref 4.0–10.5)
nRBC: 0 % (ref 0.0–0.2)

## 2022-12-02 LAB — CMP (CANCER CENTER ONLY)
ALT: 17 U/L (ref 0–44)
AST: 14 U/L — ABNORMAL LOW (ref 15–41)
Albumin: 4.4 g/dL (ref 3.5–5.0)
Alkaline Phosphatase: 63 U/L (ref 38–126)
Anion gap: 10 (ref 5–15)
BUN: 11 mg/dL (ref 8–23)
CO2: 26 mmol/L (ref 22–32)
Calcium: 9.2 mg/dL (ref 8.9–10.3)
Chloride: 95 mmol/L — ABNORMAL LOW (ref 98–111)
Creatinine: 0.67 mg/dL (ref 0.61–1.24)
GFR, Estimated: >60 mL/min (ref >60)
Glucose, Bld: 123 mg/dL — ABNORMAL HIGH (ref 70–99)
Potassium: 3.9 mmol/L (ref 3.5–5.1)
Sodium: 131 mmol/L — ABNORMAL LOW (ref 135–145)
Total Bilirubin: 0.5 mg/dL (ref 0.3–1.2)
Total Protein: 6.7 g/dL (ref 6.5–8.1)

## 2022-12-02 LAB — FERRITIN: Ferritin: 28 ng/mL (ref 24–336)

## 2022-12-02 NOTE — Progress Notes (Signed)
  [facility] OFFICE PROGRESS NOTE   Diagnosis: CLL, thrombocytosis  INTERVAL HISTORY:   Mr. Tewalt returns as scheduled.  He continues acalabrutinib.  No fever, night sweats, or bleeding.  No palpable lymph nodes.  No recent infection.  No complaint.  Objective:  Vital signs in last 24 hours:  Blood pressure (!) 126/46, pulse 74, temperature 98.2 F (36.8 C), temperature source Oral, resp. rate 18, height 6' (1.829 m), weight 197 lb (89.4 kg), SpO2 100%.    HEENT: Mild white coat over the tongue, no buccal thrush. Lymphatics: No cervical, supraclavicular, axillary, or inguinal nodes Resp: Lungs clear bilaterally Cardio: Regular rate and rhythm GI: No hepatosplenomegaly Vascular: No left leg edema   Lab Results:  Lab Results  Component Value Date   WBC 10.8 (H) 12/02/2022   HGB 15.9 12/02/2022   HCT 48.6 12/02/2022   MCV 82.0 12/02/2022   PLT 696 (H) 12/02/2022   NEUTROABS 7.9 (H) 12/02/2022    CMP  Lab Results  Component Value Date   NA 131 (L) 12/02/2022   K 3.9 12/02/2022   CL 95 (L) 12/02/2022   CO2 26 12/02/2022   GLUCOSE 123 (H) 12/02/2022   BUN 11 12/02/2022   CREATININE 0.67 12/02/2022   CALCIUM 9.2 12/02/2022   PROT 6.7 12/02/2022   ALBUMIN 4.4 12/02/2022   AST 14 (L) 12/02/2022   ALT 17 12/02/2022   ALKPHOS 63 12/02/2022   BILITOT 0.5 12/02/2022   GFRNONAA >60 12/02/2022   GFRAA >60 01/08/2020     Medications: I have reviewed the patient's current medications.   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   Right lower extremity and ileal caval DVT 07/29/2021-mechanical thrombectomy Bilateral lobar and segmental pulmonary embolism with  evidence of borderline right heart strain 08/08/2021 CT chest 08/26/2021-negative for pulmonary embolism, previously noted pulmonary emboli no longer seen Apixaban anticoagulation, discontinued when he developed gross hematuria IVC filter placed 08/30/2021  4.  Urinary retention following amputation surgery 2023, Foley catheter in place 5.  BPH 6.  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  Hypertension 8.  Thrombocytosis     Disposition: Mr Johny Blamer is in clinical remission from CLL.  He has persistent thrombocytosis.  The etiology of the thrombocytosis is unclear.  The iron level is in the low normal range and the hemoglobin is normal.  We submitted a myeloproliferative panel today.  He will continue acalabrutinib.  He will return for an office visit in 3 months.  I recommended he remain up-to-date on influenza and pneumonia vaccines.  Thornton Papas, MD  12/02/2022  11:00 AM

## 2022-12-14 ENCOUNTER — Other Ambulatory Visit: Payer: Self-pay | Admitting: Oncology

## 2022-12-14 ENCOUNTER — Other Ambulatory Visit: Payer: Self-pay

## 2022-12-14 ENCOUNTER — Other Ambulatory Visit (HOSPITAL_COMMUNITY): Payer: Self-pay

## 2022-12-14 DIAGNOSIS — C9110 Chronic lymphocytic leukemia of B-cell type not having achieved remission: Secondary | ICD-10-CM

## 2022-12-14 MED ORDER — CALQUENCE 100 MG PO TABS
100.0000 mg | ORAL_TABLET | Freq: Two times a day (BID) | ORAL | 2 refills | Status: DC
Start: 2022-12-14 — End: 2023-03-08
  Filled 2022-12-14: qty 60, 30d supply, fill #0
  Filled 2023-01-10: qty 60, 30d supply, fill #1
  Filled 2023-02-10: qty 60, 30d supply, fill #2

## 2022-12-28 ENCOUNTER — Other Ambulatory Visit (HOSPITAL_COMMUNITY): Payer: Self-pay

## 2022-12-29 ENCOUNTER — Other Ambulatory Visit (HOSPITAL_COMMUNITY): Payer: Self-pay

## 2022-12-29 MED ORDER — STERILE WATER FOR IRRIGATION IR SOLN
3 refills | Status: DC
  Filled 2022-12-29: qty 500, 15d supply, fill #0
  Filled 2023-01-04: qty 500, 15d supply, fill #1
  Filled 2023-01-25: qty 500, 15d supply, fill #2
  Filled 2023-02-13 – 2023-02-17 (×4): qty 500, 15d supply, fill #3

## 2022-12-31 LAB — JAK2 (INCLUDING V617F AND EXON 12), MPL,& CALR-NEXT GEN SEQ

## 2023-01-02 ENCOUNTER — Telehealth: Payer: Self-pay | Admitting: *Deleted

## 2023-01-02 NOTE — Telephone Encounter (Signed)
-----   Message from Thornton Papas sent at 01/01/2023  8:41 AM EDT ----- Please call patient, the myeloproliferative panel reveals a JAK2 mutation,  likely has a myeloproliferative disorder, we will discuss next visit, schedule office next  6 weeks, will discuss platelet lowering therapy

## 2023-01-02 NOTE — Telephone Encounter (Signed)
Notified patient that he has JAK2 mutation and MD wants to discuss platelet lowering therapy in ~ 6 weeks. Will move his December visit to November. He agrees. Scheduling message sent.

## 2023-01-04 ENCOUNTER — Other Ambulatory Visit (HOSPITAL_COMMUNITY): Payer: Self-pay

## 2023-01-04 ENCOUNTER — Other Ambulatory Visit: Payer: Self-pay

## 2023-01-10 ENCOUNTER — Other Ambulatory Visit (HOSPITAL_COMMUNITY): Payer: Self-pay

## 2023-01-10 ENCOUNTER — Other Ambulatory Visit: Payer: Self-pay | Admitting: Pharmacy Technician

## 2023-01-10 NOTE — Progress Notes (Signed)
Specialty Pharmacy Refill Coordination Note  Douglas [name] is a 72 y.o. male contacted today regarding refills of specialty medication(s) Acalabrutinib Maleate   Patient requested Delivery   Delivery date: 01/18/23   Verified address: [address] [place], [place]   Medication will be filled on 01/17/23.

## 2023-01-17 ENCOUNTER — Ambulatory Visit (INDEPENDENT_AMBULATORY_CARE_PROVIDER_SITE_OTHER): Payer: 59

## 2023-01-17 DIAGNOSIS — I441 Atrioventricular block, second degree: Secondary | ICD-10-CM | POA: Diagnosis not present

## 2023-01-18 LAB — CUP PACEART REMOTE DEVICE CHECK
Battery Remaining Longevity: 131 mo
Battery Voltage: 3.02 V
Brady Statistic AP VP Percent: 8.25 %
Brady Statistic AP VS Percent: 0 %
Brady Statistic AS VP Percent: 87.46 %
Brady Statistic AS VS Percent: 4.28 %
Brady Statistic RA Percent Paced: 10.77 %
Brady Statistic RV Percent Paced: 95.72 %
Date Time Interrogation Session: 20241007222057
Implantable Lead Connection Status: 753985
Implantable Lead Connection Status: 753985
Implantable Lead Implant Date: 20221011
Implantable Lead Implant Date: 20221011
Implantable Lead Location: 753859
Implantable Lead Location: 753860
Implantable Lead Model: 3830
Implantable Lead Model: 5076
Implantable Pulse Generator Implant Date: 20221011
Lead Channel Impedance Value: 380 Ohm
Lead Channel Impedance Value: 399 Ohm
Lead Channel Impedance Value: 513 Ohm
Lead Channel Impedance Value: 532 Ohm
Lead Channel Pacing Threshold Amplitude: 0.625 V
Lead Channel Pacing Threshold Amplitude: 0.625 V
Lead Channel Pacing Threshold Pulse Width: 0.4 ms
Lead Channel Pacing Threshold Pulse Width: 0.4 ms
Lead Channel Sensing Intrinsic Amplitude: 2.875 mV
Lead Channel Sensing Intrinsic Amplitude: 2.875 mV
Lead Channel Sensing Intrinsic Amplitude: 3.5 mV
Lead Channel Sensing Intrinsic Amplitude: 3.5 mV
Lead Channel Setting Pacing Amplitude: 1.5 V
Lead Channel Setting Pacing Amplitude: 2 V
Lead Channel Setting Pacing Pulse Width: 0.4 ms
Lead Channel Setting Sensing Sensitivity: 0.9 mV
Zone Setting Status: 755011
Zone Setting Status: 755011

## 2023-01-25 ENCOUNTER — Other Ambulatory Visit: Payer: Self-pay

## 2023-01-25 ENCOUNTER — Other Ambulatory Visit (HOSPITAL_COMMUNITY): Payer: Self-pay

## 2023-02-06 NOTE — Progress Notes (Signed)
Remote pacemaker transmission.   

## 2023-02-10 ENCOUNTER — Other Ambulatory Visit: Payer: Self-pay

## 2023-02-10 NOTE — Progress Notes (Signed)
Specialty Pharmacy Refill Coordination Note  Douglas [name] is a 72 y.o. male contacted today regarding refills of specialty medication(s) Acalabrutinib Maleate   Patient requested Delivery   Delivery date: 02/15/23   Verified address: [address] [place], [place]   Medication will be filled on 02/14/23.

## 2023-02-13 ENCOUNTER — Other Ambulatory Visit (HOSPITAL_COMMUNITY): Payer: Self-pay

## 2023-02-14 ENCOUNTER — Other Ambulatory Visit: Payer: Self-pay

## 2023-02-16 ENCOUNTER — Other Ambulatory Visit: Payer: Self-pay

## 2023-02-16 ENCOUNTER — Other Ambulatory Visit (HOSPITAL_COMMUNITY): Payer: Self-pay

## 2023-02-17 ENCOUNTER — Other Ambulatory Visit (HOSPITAL_COMMUNITY): Payer: Self-pay

## 2023-02-17 ENCOUNTER — Other Ambulatory Visit: Payer: Self-pay

## 2023-02-17 ENCOUNTER — Other Ambulatory Visit (HOSPITAL_BASED_OUTPATIENT_CLINIC_OR_DEPARTMENT_OTHER): Payer: Self-pay

## 2023-02-17 MED ORDER — STERILE WATER FOR IRRIGATION IR SOLN
3 refills | Status: DC
  Filled 2023-02-17: qty 1000, 30d supply, fill #0

## 2023-02-17 MED ORDER — STERILE WATER FOR IRRIGATION IR SOLN: 3 refills | Status: DC

## 2023-02-24 ENCOUNTER — Inpatient Hospital Stay (HOSPITAL_BASED_OUTPATIENT_CLINIC_OR_DEPARTMENT_OTHER): Payer: Medicare Other | Admitting: Oncology

## 2023-02-24 ENCOUNTER — Other Ambulatory Visit (HOSPITAL_BASED_OUTPATIENT_CLINIC_OR_DEPARTMENT_OTHER): Payer: Self-pay

## 2023-02-24 ENCOUNTER — Inpatient Hospital Stay: Payer: Medicare Other | Attending: Oncology

## 2023-02-24 VITALS — BP 127/68 | HR 86 | Temp 98.2°F | Resp 18 | Ht 72.0 in | Wt 196.4 lb

## 2023-02-24 DIAGNOSIS — C9111 Chronic lymphocytic leukemia of B-cell type in remission: Secondary | ICD-10-CM | POA: Diagnosis present

## 2023-02-24 DIAGNOSIS — C9110 Chronic lymphocytic leukemia of B-cell type not having achieved remission: Secondary | ICD-10-CM | POA: Diagnosis not present

## 2023-02-24 DIAGNOSIS — I10 Essential (primary) hypertension: Secondary | ICD-10-CM | POA: Insufficient documentation

## 2023-02-24 DIAGNOSIS — I739 Peripheral vascular disease, unspecified: Secondary | ICD-10-CM | POA: Insufficient documentation

## 2023-02-24 DIAGNOSIS — Z950 Presence of cardiac pacemaker: Secondary | ICD-10-CM | POA: Diagnosis not present

## 2023-02-24 LAB — CBC WITH DIFFERENTIAL (CANCER CENTER ONLY)
Abs Immature Granulocytes: 0.08 10*3/uL — ABNORMAL HIGH (ref 0.00–0.07)
Basophils Absolute: 0.1 10*3/uL (ref 0.0–0.1)
Basophils Relative: 1 %
Eosinophils Absolute: 0.2 10*3/uL (ref 0.0–0.5)
Eosinophils Relative: 2 %
HCT: 48.2 % (ref 39.0–52.0)
Hemoglobin: 15.3 g/dL (ref 13.0–17.0)
Immature Granulocytes: 1 %
Lymphocytes Relative: 15 %
Lymphs Abs: 1.5 10*3/uL (ref 0.7–4.0)
MCH: 26.3 pg (ref 26.0–34.0)
MCHC: 31.7 g/dL (ref 30.0–36.0)
MCV: 82.8 fL (ref 80.0–100.0)
Monocytes Absolute: 0.5 10*3/uL (ref 0.1–1.0)
Monocytes Relative: 5 %
Neutro Abs: 7.7 10*3/uL (ref 1.7–7.7)
Neutrophils Relative %: 76 %
Platelet Count: 738 10*3/uL — ABNORMAL HIGH (ref 150–400)
RBC: 5.82 MIL/uL — ABNORMAL HIGH (ref 4.22–5.81)
RDW: 19.6 % — ABNORMAL HIGH (ref 11.5–15.5)
WBC Count: 10.1 10*3/uL (ref 4.0–10.5)
nRBC: 0 % (ref 0.0–0.2)

## 2023-02-24 LAB — CMP (CANCER CENTER ONLY)
ALT: 17 U/L (ref 0–44)
AST: 11 U/L — ABNORMAL LOW (ref 15–41)
Albumin: 4.6 g/dL (ref 3.5–5.0)
Alkaline Phosphatase: 57 U/L (ref 38–126)
Anion gap: 9 (ref 5–15)
BUN: 14 mg/dL (ref 8–23)
CO2: 31 mmol/L (ref 22–32)
Calcium: 9.4 mg/dL (ref 8.9–10.3)
Chloride: 96 mmol/L — ABNORMAL LOW (ref 98–111)
Creatinine: 0.71 mg/dL (ref 0.61–1.24)
GFR, Estimated: >60 mL/min (ref >60)
Glucose, Bld: 132 mg/dL — ABNORMAL HIGH (ref 70–99)
Potassium: 4 mmol/L (ref 3.5–5.1)
Sodium: 136 mmol/L (ref 135–145)
Total Bilirubin: 0.5 mg/dL (ref <1.2)
Total Protein: 6.8 g/dL (ref 6.5–8.1)

## 2023-02-24 LAB — LACTATE DEHYDROGENASE: LDH: 155 U/L (ref 98–192)

## 2023-02-24 MED ORDER — HYDROXYUREA 500 MG PO CAPS
500.0000 mg | ORAL_CAPSULE | Freq: Every day | ORAL | 0 refills | Status: DC
  Filled 2023-02-24: qty 30, 30d supply, fill #0

## 2023-02-24 NOTE — Progress Notes (Signed)
  [facility] OFFICE PROGRESS NOTE   Diagnosis: CLL  INTERVAL HISTORY:   Mr. Forshee returns as scheduled.  He continues on acalabrutinib.  No palpable lymph nodes.  No fever or night sweats.  He is being treated for a urinary tract infection.  No symptom of recurrent thrombosis.  Objective:  Vital signs in last 24 hours:  Blood pressure 127/68, pulse 86, temperature 98.2 F (36.8 C), temperature source Oral, resp. rate 18, height 6' (1.829 m), weight 196 lb 6.4 oz (89.1 kg), SpO2 94%.   Lymphatics: No cervical, supraclavicular, axillary, or inguinal nodes Resp: Lungs clear bilaterally Cardio: Regular rate and rhythm GI: No hepatosplenomegaly    Lab Results:  Lab Results  Component Value Date   WBC 10.1 02/24/2023   HGB 15.3 02/24/2023   HCT 48.2 02/24/2023   MCV 82.8 02/24/2023   PLT 738 (H) 02/24/2023   NEUTROABS 7.7 02/24/2023    CMP  Lab Results  Component Value Date   NA 136 02/24/2023   K 4.0 02/24/2023   CL 96 (L) 02/24/2023   CO2 31 02/24/2023   GLUCOSE 132 (H) 02/24/2023   BUN 14 02/24/2023   CREATININE 0.71 02/24/2023   CALCIUM 9.4 02/24/2023   PROT 6.8 02/24/2023   ALBUMIN 4.6 02/24/2023   AST 11 (L) 02/24/2023   ALT 17 02/24/2023   ALKPHOS 57 02/24/2023   BILITOT 0.5 02/24/2023   GFRNONAA >60 02/24/2023   GFRAA >60 01/08/2020    No results found.  Medications: I have reviewed the patient's current medications.   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   Right lower extremity and ileal caval DVT 07/29/2021-mechanical thrombectomy Bilateral lobar and segmental pulmonary embolism with evidence of borderline right heart strain  08/08/2021 CT chest 08/26/2021-negative for pulmonary embolism, previously noted pulmonary emboli no longer seen Apixaban anticoagulation, discontinued when he developed gross hematuria IVC filter placed 08/30/2021  4.  Urinary retention following amputation surgery 2023, Foley catheter in place 5.  BPH 6.  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  He will continue acalabrutinib.  He has persistent thrombocytosis peripheral blood sample was positive for a JAK2 mutation.  He appears to have essential thrombocytosis.  He is at increased risk for thrombosis with the elevated platelet count and previous history of thrombosis.  He is maintained on aspirin and Plavix. I recommend platelet lowering therapy.  He agrees to proceed with hydroxyurea.  We reviewed potential toxicities associated with hydroxyurea including the chance of hematologic toxicity, rash, skin ulcers, mouth sores, nausea, and hepatic toxicity.  He will begin hydroxyurea at a dose of 500 mg daily.  He will return for an office and lab visit in 1 month.  Thornton Papas, MD  02/24/2023  11:52 AM

## 2023-03-08 ENCOUNTER — Other Ambulatory Visit: Payer: Self-pay

## 2023-03-08 ENCOUNTER — Other Ambulatory Visit: Payer: Self-pay | Admitting: Oncology

## 2023-03-08 DIAGNOSIS — C9110 Chronic lymphocytic leukemia of B-cell type not having achieved remission: Secondary | ICD-10-CM

## 2023-03-08 MED ORDER — CALQUENCE 100 MG PO TABS
100.0000 mg | ORAL_TABLET | Freq: Two times a day (BID) | ORAL | 2 refills | Status: DC
  Filled 2023-03-08: qty 60, 30d supply, fill #0
  Filled 2023-04-10: qty 60, 30d supply, fill #1
  Filled 2023-05-18: qty 60, 30d supply, fill #2

## 2023-03-08 NOTE — Progress Notes (Signed)
Specialty Pharmacy Refill Coordination Note  Douglas [name] is a 72 y.o. male contacted today regarding refills of specialty medication(s) Acalabrutinib Maleate   Patient requested Delivery   Delivery date: 03/15/23   Verified address: [address] [place], [place]   Medication will be filled on 03/14/23.  Refill request pending.

## 2023-03-14 ENCOUNTER — Other Ambulatory Visit: Payer: Self-pay

## 2023-03-14 ENCOUNTER — Other Ambulatory Visit: Payer: Medicare Other

## 2023-03-14 ENCOUNTER — Ambulatory Visit: Payer: Medicare Other | Admitting: Oncology

## 2023-03-28 ENCOUNTER — Other Ambulatory Visit: Payer: Medicare Other

## 2023-03-28 ENCOUNTER — Other Ambulatory Visit (HOSPITAL_BASED_OUTPATIENT_CLINIC_OR_DEPARTMENT_OTHER): Payer: Self-pay

## 2023-03-28 ENCOUNTER — Inpatient Hospital Stay: Payer: Medicare Other

## 2023-03-28 ENCOUNTER — Other Ambulatory Visit: Payer: Self-pay | Admitting: *Deleted

## 2023-03-28 ENCOUNTER — Inpatient Hospital Stay: Payer: Medicare Other | Attending: Oncology | Admitting: Oncology

## 2023-03-28 VITALS — BP 122/62 | HR 100 | Temp 98.1°F | Resp 18 | Ht 72.0 in | Wt 198.0 lb

## 2023-03-28 DIAGNOSIS — Z89611 Acquired absence of right leg above knee: Secondary | ICD-10-CM | POA: Insufficient documentation

## 2023-03-28 DIAGNOSIS — Z87891 Personal history of nicotine dependence: Secondary | ICD-10-CM | POA: Insufficient documentation

## 2023-03-28 DIAGNOSIS — I10 Essential (primary) hypertension: Secondary | ICD-10-CM | POA: Diagnosis not present

## 2023-03-28 DIAGNOSIS — C9110 Chronic lymphocytic leukemia of B-cell type not having achieved remission: Secondary | ICD-10-CM

## 2023-03-28 DIAGNOSIS — D473 Essential (hemorrhagic) thrombocythemia: Secondary | ICD-10-CM | POA: Insufficient documentation

## 2023-03-28 DIAGNOSIS — I739 Peripheral vascular disease, unspecified: Secondary | ICD-10-CM | POA: Diagnosis not present

## 2023-03-28 DIAGNOSIS — C9111 Chronic lymphocytic leukemia of B-cell type in remission: Secondary | ICD-10-CM | POA: Insufficient documentation

## 2023-03-28 DIAGNOSIS — Z950 Presence of cardiac pacemaker: Secondary | ICD-10-CM | POA: Diagnosis not present

## 2023-03-28 DIAGNOSIS — Z89511 Acquired absence of right leg below knee: Secondary | ICD-10-CM | POA: Insufficient documentation

## 2023-03-28 DIAGNOSIS — M898X9 Other specified disorders of bone, unspecified site: Secondary | ICD-10-CM | POA: Insufficient documentation

## 2023-03-28 LAB — CBC WITH DIFFERENTIAL (CANCER CENTER ONLY)
Abs Immature Granulocytes: 0.11 10*3/uL — ABNORMAL HIGH (ref 0.00–0.07)
Basophils Absolute: 0.1 10*3/uL (ref 0.0–0.1)
Basophils Relative: 1 %
Eosinophils Absolute: 0.1 10*3/uL (ref 0.0–0.5)
Eosinophils Relative: 1 %
HCT: 37.4 % — ABNORMAL LOW (ref 39.0–52.0)
Hemoglobin: 12.1 g/dL — ABNORMAL LOW (ref 13.0–17.0)
Immature Granulocytes: 1 %
Lymphocytes Relative: 15 %
Lymphs Abs: 1.7 10*3/uL (ref 0.7–4.0)
MCH: 27.8 pg (ref 26.0–34.0)
MCHC: 32.4 g/dL (ref 30.0–36.0)
MCV: 86 fL (ref 80.0–100.0)
Monocytes Absolute: 0.5 10*3/uL (ref 0.1–1.0)
Monocytes Relative: 5 %
Neutro Abs: 8.9 10*3/uL — ABNORMAL HIGH (ref 1.7–7.7)
Neutrophils Relative %: 77 %
Platelet Count: 922 10*3/uL (ref 150–400)
RBC: 4.35 MIL/uL (ref 4.22–5.81)
RDW: 19.9 % — ABNORMAL HIGH (ref 11.5–15.5)
WBC Count: 11.4 10*3/uL — ABNORMAL HIGH (ref 4.0–10.5)
nRBC: 0 % (ref 0.0–0.2)

## 2023-03-28 MED ORDER — HYDROXYUREA 500 MG PO CAPS
ORAL_CAPSULE | ORAL | 1 refills | Status: DC
  Filled 2023-03-28: qty 42, 30d supply, fill #0
  Filled 2023-04-25: qty 42, 30d supply, fill #1

## 2023-03-28 NOTE — Progress Notes (Addendum)
CRITICAL VALUE STICKER  CRITICAL VALUE: Platelets [id number]  RECEIVER (on-site recipient of call):Kiarrah Rausch, RN  DATE & TIME NOTIFIED: 03/28/23 @ 0834  MESSENGER (representative from lab):Marchelle Folks  MD NOTIFIED: Dr. Truett Perna  TIME OF NOTIFICATION: 1610  RESPONSE: Increase Hydrea to 1000 mg MWF and 500 mg daily

## 2023-03-28 NOTE — Progress Notes (Signed)
Douglas [name]   Diagnosis: CLL  INTERVAL HISTORY:   Douglas [name] began hydroxyurea last month.  No rash or mouth sores.  He reports bone pain beginning approximately 2 weeks after starting hydroxyurea.  He has pain in the neck, right thigh, left lower leg and left foot.  The pain is relieved with Tylenol.  He takes Tylenol 3 times per day.  No fever.  He has chronic night sweats.  No palpable lymph nodes.  Objective:  Vital signs in last 24 hours:  Blood pressure 122/62, pulse 100, temperature 98.1 F (36.7 C), temperature source Temporal, resp. rate 18, height 6' (1.829 m), weight 198 lb (89.8 kg), SpO2 96%. HEENT: No thrush or ulcers Lymphatics: No cervical, supraclavicular, axillary, or inguinal nodes Resp: Lungs clear bilaterally Cardio: Regular rate and rhythm GI: No hepatosplenomegaly Musculoskeletal: Examination of the left leg and foot reveals no swelling or erythema.  Mild tenderness at the left lower lateral leg, ankle, and dorsum of the foot.    Lab Results:  Lab Results  Component Value Date   WBC 10.1 02/24/2023   HGB 15.3 02/24/2023   HCT 48.2 02/24/2023   MCV 82.8 02/24/2023   PLT 738 (H) 02/24/2023   NEUTROABS 7.7 02/24/2023    CMP  Lab Results  Component Value Date   NA 136 02/24/2023   K 4.0 02/24/2023   CL 96 (L) 02/24/2023   CO2 31 02/24/2023   GLUCOSE 132 (H) 02/24/2023   BUN 14 02/24/2023   CREATININE 0.71 02/24/2023   CALCIUM 9.4 02/24/2023   PROT 6.8 02/24/2023   ALBUMIN 4.6 02/24/2023   AST 11 (L) 02/24/2023   ALT 17 02/24/2023   ALKPHOS 57 02/24/2023   BILITOT 0.5 02/24/2023   GFRNONAA >60 02/24/2023   GFRAA >60 01/08/2020    No results found.  Medications: I have reviewed the patient's current medications.   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   Right lower extremity and ileal caval DVT 07/29/2021-mechanical thrombectomy Bilateral lobar and segmental pulmonary embolism with evidence of borderline right heart strain 08/08/2021 CT chest 08/26/2021-negative for pulmonary embolism, previously noted pulmonary emboli no longer seen Apixaban anticoagulation, discontinued when he developed gross hematuria IVC filter placed 08/30/2021  4.  Urinary retention following amputation surgery 2023, Foley catheter in place 5.  BPH 6.  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  He continues acalabrutinib.  He remains in clinical remission from CLL.  The etiology of the bone pain is unclear.  This is not a common side effect of hydroxyurea, but it is possible the pain is related to hydroxyurea.  He will continue hydroxyurea and Tylenol.  He will call if the pain progresses.  The platelet count is higher and the hemoglobin is lower today.  He denies bleeding.  He will increase the hydroxyurea dose to 500 mg daily and 1000 mg on Monday, Wednesday, and Friday.  He will return for an office and lab visit in 3-4 weeks.  He will call for symptoms of anemia.    Douglas Papas, MD  03/28/2023  8:30  AM

## 2023-04-04 ENCOUNTER — Other Ambulatory Visit: Payer: Self-pay

## 2023-04-04 ENCOUNTER — Other Ambulatory Visit (HOSPITAL_COMMUNITY): Payer: Self-pay

## 2023-04-10 ENCOUNTER — Other Ambulatory Visit: Payer: Self-pay

## 2023-04-10 ENCOUNTER — Telehealth: Payer: Self-pay

## 2023-04-10 ENCOUNTER — Other Ambulatory Visit (HOSPITAL_COMMUNITY): Payer: Self-pay

## 2023-04-10 NOTE — Telephone Encounter (Addendum)
The patient reported experiencing persistent pain in multiple areas, including the jaw, feet, facial region, and joints. They indicated that Tylenol has not been effective in alleviating their discomfort and are requesting a pain medication that will provide more effective relief. I spoke with GBS and  he recommendthe patient to  add Aleve or Ibuprofen 400mg -600mg  twice daily. If not effective, stop hydrea and call with symptoms after 2-3 days. Patient gave verbal understanding and had no further questions or concerns.

## 2023-04-10 NOTE — Progress Notes (Signed)
Specialty Pharmacy Refill Coordination Note  Douglas [name] is a 72 y.o. male contacted today regarding refills of specialty medication(s) Acalabrutinib Maleate (Calquence)   Patient requested Delivery   Delivery date: 04/25/23   Verified address: [address] [place] [address]   Medication will be filled on 04/24/23.

## 2023-04-13 ENCOUNTER — Telehealth: Payer: Self-pay

## 2023-04-13 NOTE — Telephone Encounter (Signed)
..  Caller: Patient's wife Dorthy   Procedure:  MD appointment with studies   Resolution:  Cancelled appointments with wife stating he has been regularly going to the Oncologist and monthly to the Urologist; that he is not having any symptoms or problems with his leg and felt like they already have enough going on as to not have to worry about how he would get on and off the table to do vascular studies.  She stated that is any problems develop, they would call us  or go to the emergency room.   Next Appt: None

## 2023-04-14 ENCOUNTER — Ambulatory Visit: Payer: Medicare Other | Admitting: Oncology

## 2023-04-14 ENCOUNTER — Other Ambulatory Visit: Payer: Medicare Other

## 2023-04-18 ENCOUNTER — Ambulatory Visit (INDEPENDENT_AMBULATORY_CARE_PROVIDER_SITE_OTHER): Payer: 59

## 2023-04-18 DIAGNOSIS — I441 Atrioventricular block, second degree: Secondary | ICD-10-CM

## 2023-04-19 ENCOUNTER — Encounter (HOSPITAL_COMMUNITY): Payer: Medicare Other

## 2023-04-19 ENCOUNTER — Ambulatory Visit: Payer: Medicare Other

## 2023-04-19 LAB — CUP PACEART REMOTE DEVICE CHECK
Battery Remaining Longevity: 128 mo
Battery Voltage: 3.02 V
Brady Statistic AP VP Percent: 4.85 %
Brady Statistic AP VS Percent: 0 %
Brady Statistic AS VP Percent: 92.09 %
Brady Statistic AS VS Percent: 3.06 %
Brady Statistic RA Percent Paced: 6.55 %
Brady Statistic RV Percent Paced: 96.94 %
Date Time Interrogation Session: 20250106202430
Implantable Lead Connection Status: 753985
Implantable Lead Connection Status: 753985
Implantable Lead Implant Date: 20221011
Implantable Lead Implant Date: 20221011
Implantable Lead Location: 753859
Implantable Lead Location: 753860
Implantable Lead Model: 3830
Implantable Lead Model: 5076
Implantable Pulse Generator Implant Date: 20221011
Lead Channel Impedance Value: 342 Ohm
Lead Channel Impedance Value: 399 Ohm
Lead Channel Impedance Value: 456 Ohm
Lead Channel Impedance Value: 532 Ohm
Lead Channel Pacing Threshold Amplitude: 0.625 V
Lead Channel Pacing Threshold Amplitude: 0.625 V
Lead Channel Pacing Threshold Pulse Width: 0.4 ms
Lead Channel Pacing Threshold Pulse Width: 0.4 ms
Lead Channel Sensing Intrinsic Amplitude: 4.375 mV
Lead Channel Sensing Intrinsic Amplitude: 4.375 mV
Lead Channel Sensing Intrinsic Amplitude: 5.5 mV
Lead Channel Sensing Intrinsic Amplitude: 5.5 mV
Lead Channel Setting Pacing Amplitude: 1.5 V
Lead Channel Setting Pacing Amplitude: 2 V
Lead Channel Setting Pacing Pulse Width: 0.4 ms
Lead Channel Setting Sensing Sensitivity: 0.9 mV
Zone Setting Status: 755011
Zone Setting Status: 755011

## 2023-04-24 ENCOUNTER — Other Ambulatory Visit: Payer: Self-pay

## 2023-04-24 ENCOUNTER — Telehealth: Payer: Self-pay

## 2023-04-24 ENCOUNTER — Other Ambulatory Visit (HOSPITAL_COMMUNITY): Payer: Self-pay

## 2023-04-24 NOTE — Telephone Encounter (Signed)
 Oral Oncology Patient Advocate Encounter   Was successful in securing patient a $4,500.00 grant from [employer] ([employer] to provide copayment coverage for his Calquence .  This will keep the out of pocket expense at $0.    The billing information is as follows and has been shared with [facility].   Member ID: [id number] Group ID: [id number] RxBin: [id number] Dates of Eligibility: 01/24/23 through 04/23/24  Fund:  Chronic Lymphocytic Leukemia   Morene Potters, CPhT Oncology Pharmacy Patient Advocate  [facility]  [phone] (phone) [phone] (fax) 04/24/2023 8:52 AM

## 2023-04-25 ENCOUNTER — Inpatient Hospital Stay: Payer: Medicare Other

## 2023-04-25 ENCOUNTER — Other Ambulatory Visit (HOSPITAL_BASED_OUTPATIENT_CLINIC_OR_DEPARTMENT_OTHER): Payer: Self-pay

## 2023-04-25 ENCOUNTER — Inpatient Hospital Stay: Payer: Medicare Other | Attending: Oncology | Admitting: Oncology

## 2023-04-25 VITALS — BP 126/69 | HR 96 | Temp 98.1°F | Resp 18 | Ht 72.0 in | Wt 201.2 lb

## 2023-04-25 DIAGNOSIS — Z89611 Acquired absence of right leg above knee: Secondary | ICD-10-CM | POA: Diagnosis not present

## 2023-04-25 DIAGNOSIS — I10 Essential (primary) hypertension: Secondary | ICD-10-CM | POA: Diagnosis not present

## 2023-04-25 DIAGNOSIS — Z89511 Acquired absence of right leg below knee: Secondary | ICD-10-CM | POA: Diagnosis not present

## 2023-04-25 DIAGNOSIS — Z87891 Personal history of nicotine dependence: Secondary | ICD-10-CM | POA: Insufficient documentation

## 2023-04-25 DIAGNOSIS — M898X9 Other specified disorders of bone, unspecified site: Secondary | ICD-10-CM | POA: Diagnosis not present

## 2023-04-25 DIAGNOSIS — I739 Peripheral vascular disease, unspecified: Secondary | ICD-10-CM | POA: Diagnosis not present

## 2023-04-25 DIAGNOSIS — D473 Essential (hemorrhagic) thrombocythemia: Secondary | ICD-10-CM | POA: Insufficient documentation

## 2023-04-25 DIAGNOSIS — C9110 Chronic lymphocytic leukemia of B-cell type not having achieved remission: Secondary | ICD-10-CM

## 2023-04-25 DIAGNOSIS — Z950 Presence of cardiac pacemaker: Secondary | ICD-10-CM | POA: Diagnosis not present

## 2023-04-25 LAB — CBC WITH DIFFERENTIAL (CANCER CENTER ONLY)
Abs Immature Granulocytes: 0.07 10*3/uL (ref 0.00–0.07)
Basophils Absolute: 0.1 10*3/uL (ref 0.0–0.1)
Basophils Relative: 1 %
Eosinophils Absolute: 0.1 10*3/uL (ref 0.0–0.5)
Eosinophils Relative: 1 %
HCT: 39.5 % (ref 39.0–52.0)
Hemoglobin: 12.6 g/dL — ABNORMAL LOW (ref 13.0–17.0)
Immature Granulocytes: 1 %
Lymphocytes Relative: 17 %
Lymphs Abs: 1.6 10*3/uL (ref 0.7–4.0)
MCH: 28.8 pg (ref 26.0–34.0)
MCHC: 31.9 g/dL (ref 30.0–36.0)
MCV: 90.4 fL (ref 80.0–100.0)
Monocytes Absolute: 0.6 10*3/uL (ref 0.1–1.0)
Monocytes Relative: 6 %
Neutro Abs: 6.9 10*3/uL (ref 1.7–7.7)
Neutrophils Relative %: 74 %
Platelet Count: 770 10*3/uL — ABNORMAL HIGH (ref 150–400)
RBC: 4.37 MIL/uL (ref 4.22–5.81)
RDW: 22.5 % — ABNORMAL HIGH (ref 11.5–15.5)
WBC Count: 9.4 10*3/uL (ref 4.0–10.5)
nRBC: 0 % (ref 0.0–0.2)

## 2023-04-25 NOTE — Progress Notes (Signed)
  [facility] OFFICE PROGRESS NOTE   Diagnosis: CLL, thrombocytosis  INTERVAL HISTORY:  Mr. Sanzone returns as scheduled.  He continues hydroxyurea  and acalabrutinib .  No bleeding or symptom of thrombosis.  He reports feeling hot each evening.  No sweats or fever.  The bone pain is much improved.  He takes Tylenol  several times per day and naproxen once or twice daily.  He thinks the pain has improved despite the Tylenol  and naproxen.  Objective:  Vital signs in last 24 hours:  Blood pressure 126/69, pulse 96, temperature 98.1 F (36.7 C), temperature source Temporal, resp. rate 18, height 6' (1.829 m), weight 201 lb 3.2 oz (91.3 kg), SpO2 99%.    HEENT: No thrush or ulcers Lymphatics: No cervical, supraclavicular, axillary, or inguinal nodes Resp: Lungs clear bilaterally Cardio: Regular rate and rhythm GI: Hepatosplenomegaly Vascular: No left leg edema   Lab Results:  Lab Results  Component Value Date   WBC 9.4 04/25/2023   HGB 12.6 (L) 04/25/2023   HCT 39.5 04/25/2023   MCV 90.4 04/25/2023   PLT 770 (H) 04/25/2023   NEUTROABS 6.9 04/25/2023    CMP  Lab Results  Component Value Date   NA 136 02/24/2023   K 4.0 02/24/2023   CL 96 (L) 02/24/2023   CO2 31 02/24/2023   GLUCOSE 132 (H) 02/24/2023   BUN 14 02/24/2023   CREATININE 0.71 02/24/2023   CALCIUM  9.4 02/24/2023   PROT 6.8 02/24/2023   ALBUMIN  4.6 02/24/2023   AST 11 (L) 02/24/2023   ALT 17 02/24/2023   ALKPHOS 57 02/24/2023   BILITOT 0.5 02/24/2023   GFRNONAA >60 02/24/2023   GFRAA >60 01/08/2020     Medications: I have reviewed the patient's current medications.   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   Right lower extremity and ileal caval DVT 07/29/2021-mechanical thrombectomy Bilateral lobar and segmental pulmonary embolism with evidence of borderline right heart strain 08/08/2021 CT chest 08/26/2021-negative for pulmonary embolism, previously noted pulmonary emboli no longer seen Apixaban  anticoagulation, discontinued when he developed gross hematuria IVC filter placed 08/30/2021  4.  Urinary retention following amputation surgery 2023, Foley catheter in place 5.  BPH 6.  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  Hypertension 8.  Essential thrombocytosis Positive JAK2 V617 on peripheral blood 12/02/2022 Hydroxyurea  500 mg daily 02/24/2023 Hydroxyurea  500 mg daily, 1000 mg M, W, F 03/28/2023     Disposition: Mr. Blakeman appears stable.  The platelet count is partially improved.  He will continue hydroxyurea  at the current dose.  He will return for an office and lab visit in 4 weeks.  We will increase the hydroxyurea  dose if the platelet count is not further improved next month.  Bone pain is significantly improved.  He will wean Tylenol  and proximal and as tolerated.  He continues on acalabrutinib  for CLL.  I recommended he obtain a pneumococcal 20 vaccine and an RSV vaccine.  He declines the influenza vaccine.    Arley Hof, MD  04/25/2023  9:20 AM

## 2023-05-18 ENCOUNTER — Other Ambulatory Visit: Payer: Self-pay

## 2023-05-18 ENCOUNTER — Other Ambulatory Visit (HOSPITAL_COMMUNITY): Payer: Self-pay

## 2023-05-18 NOTE — Progress Notes (Signed)
 Specialty Pharmacy Refill Coordination Note  Douglas [name] is a 73 y.o. male contacted today regarding refills of specialty medication(s) Acalabrutinib  Maleate (Calquence ) Spoke with aptient's wife, Douglas [name].  Patient requested No data recorded  Delivery date: 05/22/23   Verified address: [address] [place] [address]   Medication will be filled on 05/19/23.

## 2023-05-19 ENCOUNTER — Other Ambulatory Visit: Payer: Self-pay

## 2023-05-25 ENCOUNTER — Other Ambulatory Visit: Payer: Self-pay | Admitting: *Deleted

## 2023-05-25 ENCOUNTER — Inpatient Hospital Stay (HOSPITAL_BASED_OUTPATIENT_CLINIC_OR_DEPARTMENT_OTHER): Payer: Medicare Other | Admitting: Oncology

## 2023-05-25 ENCOUNTER — Other Ambulatory Visit (HOSPITAL_BASED_OUTPATIENT_CLINIC_OR_DEPARTMENT_OTHER): Payer: Self-pay

## 2023-05-25 ENCOUNTER — Inpatient Hospital Stay: Payer: Medicare Other | Attending: Oncology

## 2023-05-25 VITALS — BP 121/72 | HR 88 | Temp 98.1°F | Resp 18 | Ht 72.0 in | Wt 202.0 lb

## 2023-05-25 DIAGNOSIS — Z87891 Personal history of nicotine dependence: Secondary | ICD-10-CM | POA: Insufficient documentation

## 2023-05-25 DIAGNOSIS — C9110 Chronic lymphocytic leukemia of B-cell type not having achieved remission: Secondary | ICD-10-CM

## 2023-05-25 DIAGNOSIS — Z950 Presence of cardiac pacemaker: Secondary | ICD-10-CM | POA: Insufficient documentation

## 2023-05-25 DIAGNOSIS — D473 Essential (hemorrhagic) thrombocythemia: Secondary | ICD-10-CM | POA: Insufficient documentation

## 2023-05-25 DIAGNOSIS — C9111 Chronic lymphocytic leukemia of B-cell type in remission: Secondary | ICD-10-CM | POA: Diagnosis present

## 2023-05-25 DIAGNOSIS — I10 Essential (primary) hypertension: Secondary | ICD-10-CM | POA: Insufficient documentation

## 2023-05-25 LAB — CMP (CANCER CENTER ONLY)
ALT: 22 U/L (ref 0–44)
AST: 13 U/L — ABNORMAL LOW (ref 15–41)
Albumin: 3.9 g/dL (ref 3.5–5.0)
Alkaline Phosphatase: 78 U/L (ref 38–126)
Anion gap: 10 (ref 5–15)
BUN: 12 mg/dL (ref 8–23)
CO2: 27 mmol/L (ref 22–32)
Calcium: 9.6 mg/dL (ref 8.9–10.3)
Chloride: 93 mmol/L — ABNORMAL LOW (ref 98–111)
Creatinine: 0.56 mg/dL — ABNORMAL LOW (ref 0.61–1.24)
GFR, Estimated: >60 mL/min (ref >60)
Glucose, Bld: 136 mg/dL — ABNORMAL HIGH (ref 70–99)
Potassium: 4.5 mmol/L (ref 3.5–5.1)
Sodium: 130 mmol/L — ABNORMAL LOW (ref 135–145)
Total Bilirubin: 0.3 mg/dL (ref 0.0–1.2)
Total Protein: 6.6 g/dL (ref 6.5–8.1)

## 2023-05-25 LAB — CBC WITH DIFFERENTIAL (CANCER CENTER ONLY)
Abs Immature Granulocytes: 0.06 10*3/uL (ref 0.00–0.07)
Basophils Absolute: 0.1 10*3/uL (ref 0.0–0.1)
Basophils Relative: 2 %
Eosinophils Absolute: 0.1 10*3/uL (ref 0.0–0.5)
Eosinophils Relative: 1 %
HCT: 40.7 % (ref 39.0–52.0)
Hemoglobin: 13 g/dL (ref 13.0–17.0)
Immature Granulocytes: 1 %
Lymphocytes Relative: 15 %
Lymphs Abs: 1.4 10*3/uL (ref 0.7–4.0)
MCH: 30.1 pg (ref 26.0–34.0)
MCHC: 31.9 g/dL (ref 30.0–36.0)
MCV: 94.2 fL (ref 80.0–100.0)
Monocytes Absolute: 0.6 10*3/uL (ref 0.1–1.0)
Monocytes Relative: 7 %
Neutro Abs: 6.7 10*3/uL (ref 1.7–7.7)
Neutrophils Relative %: 74 %
Platelet Count: 709 10*3/uL — ABNORMAL HIGH (ref 150–400)
RBC: 4.32 MIL/uL (ref 4.22–5.81)
RDW: 20.1 % — ABNORMAL HIGH (ref 11.5–15.5)
WBC Count: 8.9 10*3/uL (ref 4.0–10.5)
nRBC: 0 % (ref 0.0–0.2)

## 2023-05-25 MED ORDER — HYDROXYUREA 500 MG PO CAPS
ORAL_CAPSULE | ORAL | 1 refills | Status: DC
  Filled 2023-05-25: qty 52, 30d supply, fill #0

## 2023-05-25 NOTE — Progress Notes (Signed)
  Douglas [name]   Diagnosis: CLL, thrombocytosis  INTERVAL HISTORY:   Douglas [name] returns as scheduled.  He continues hydroxyurea.  No bleeding or symptom of thrombosis.  No fever or night sweats. No palpable lymph nodes.  He developed a "rash "at the dorsum of the left foot 2 weeks ago.  He continues to have intermittent bone pain.  He takes Tylenol and Aleve as needed. Objective:  Vital signs in last 24 hours:  Blood pressure 121/72, pulse 88, temperature 98.1 F (36.7 C), temperature source Temporal, resp. rate 18, height 6' (1.829 m), weight 202 lb (91.6 kg), SpO2 100%.    HEENT: No thrush or ulcers Lymphatics: No cervical, supraclavicular, axillary, or inguinal nodes Resp: Lungs clear bilaterally Cardio: Regular rate and rhythm GI: No hepatosplenomegaly Vascular: No left leg edema  Skin: Dryness with superficial flaking and patches of flat erythema at the left lower leg and foot  Lab Results:  Lab Results  Component Value Date   WBC 8.9 05/25/2023   HGB 13.0 05/25/2023   HCT 40.7 05/25/2023   MCV 94.2 05/25/2023   PLT 709 (H) 05/25/2023   NEUTROABS 6.7 05/25/2023    CMP  Lab Results  Component Value Date   NA 130 (L) 05/25/2023   K 4.5 05/25/2023   CL 93 (L) 05/25/2023   CO2 27 05/25/2023   GLUCOSE 136 (H) 05/25/2023   BUN 12 05/25/2023   CREATININE 0.56 (L) 05/25/2023   CALCIUM 9.6 05/25/2023   PROT 6.6 05/25/2023   ALBUMIN 3.9 05/25/2023   AST 13 (L) 05/25/2023   ALT 22 05/25/2023   ALKPHOS 78 05/25/2023   BILITOT 0.3 05/25/2023   GFRNONAA >60 05/25/2023   GFRAA >60 01/08/2020    Medications: I have reviewed the patient's current medications.   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   Right lower extremity and ileal caval DVT 07/29/2021-mechanical thrombectomy Bilateral lobar and segmental pulmonary embolism with evidence of borderline right heart strain 08/08/2021 CT chest 08/26/2021-negative for pulmonary embolism, previously noted pulmonary emboli no longer seen Apixaban anticoagulation, discontinued when he developed gross hematuria IVC filter placed 08/30/2021  4.  Urinary retention following amputation surgery 2023, Foley catheter in place 5.  BPH 6.  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  Hypertension 8.  Essential thrombocytosis Positive JAK2 V617 on peripheral blood 12/02/2022 Hydroxyurea 500 mg daily 02/24/2023 Hydroxyurea 500 mg daily, 1000 mg M, W, F 03/28/2023 Hydroxyurea 1000 mg daily Monday-Friday, 500 mg Saturday/Sunday, 05/25/2023     Disposition: Douglas. [name] appears stable.  He is in clinical remission from CLL.  The platelet count is lower, but remains above goal range.  He is already hydroxyurea well.  The hydroxyurea dose will be increased to 1000 mg Monday-Friday and 500 mg Saturday/Sunday.  He will return for an office and lab visit in 1 month.  I recommended he obtain influenza and pneumococcal 21 vaccines. He will use a moisturizer on the left leg and foot. Thornton Papas, MD  05/25/2023  10:41 AM

## 2023-05-31 NOTE — Progress Notes (Signed)
 Remote pacemaker transmission.

## 2023-05-31 NOTE — Addendum Note (Signed)
Addended by: Geralyn Flash D on: 05/31/2023 12:04 PM   Modules accepted: Orders

## 2023-06-07 ENCOUNTER — Other Ambulatory Visit: Payer: Self-pay

## 2023-06-07 ENCOUNTER — Other Ambulatory Visit: Payer: Self-pay | Admitting: Oncology

## 2023-06-07 DIAGNOSIS — C9110 Chronic lymphocytic leukemia of B-cell type not having achieved remission: Secondary | ICD-10-CM

## 2023-06-07 MED ORDER — CALQUENCE 100 MG PO TABS
100.0000 mg | ORAL_TABLET | Freq: Two times a day (BID) | ORAL | 2 refills | Status: DC
  Filled 2023-06-07: qty 60, 30d supply, fill #0
  Filled 2023-07-10: qty 60, 30d supply, fill #1
  Filled 2023-08-15: qty 60, 30d supply, fill #2

## 2023-06-07 NOTE — Progress Notes (Signed)
 Specialty Pharmacy Ongoing Clinical Assessment Note  Douglas [name] is a 73 y.o. male who is being followed by the specialty pharmacy service for RxSp Oncology   Patient's specialty medication(s) reviewed today: Acalabrutinib Maleate (Calquence)   Missed doses in the last 4 weeks: 0   Patient/Caregiver did not have any additional questions or concerns.   Therapeutic benefit summary: Patient is achieving benefit   Adverse events/side effects summary: No adverse events/side effects   Patient's therapy is appropriate to: Continue    Goals Addressed             This Visit's Progress    Achieve or maintain remission       Patient is on track. Patient will maintain adherence. Per provider note from 05/25/23 patient remains in remission.          Follow up:  6 months  Otto Herb Specialty Pharmacist

## 2023-06-07 NOTE — Progress Notes (Signed)
 Specialty Pharmacy Refill Coordination Note  Douglas [name] is a 73 y.o. male contacted today regarding refills of specialty medication(s) Acalabrutinib Maleate (Calquence)   Patient requested Delivery   Delivery date: 06/15/23   Verified address: [address] [place] [address]   Medication will be filled on 06/14/23.

## 2023-06-19 ENCOUNTER — Inpatient Hospital Stay: Payer: Medicare Other

## 2023-06-19 ENCOUNTER — Other Ambulatory Visit (HOSPITAL_BASED_OUTPATIENT_CLINIC_OR_DEPARTMENT_OTHER): Payer: Self-pay

## 2023-06-19 ENCOUNTER — Inpatient Hospital Stay: Payer: Medicare Other | Attending: Oncology | Admitting: Oncology

## 2023-06-19 VITALS — BP 131/73 | HR 87 | Temp 98.2°F | Resp 18 | Ht 72.0 in | Wt 208.0 lb

## 2023-06-19 DIAGNOSIS — C9110 Chronic lymphocytic leukemia of B-cell type not having achieved remission: Secondary | ICD-10-CM

## 2023-06-19 DIAGNOSIS — I10 Essential (primary) hypertension: Secondary | ICD-10-CM | POA: Insufficient documentation

## 2023-06-19 DIAGNOSIS — Z950 Presence of cardiac pacemaker: Secondary | ICD-10-CM | POA: Diagnosis not present

## 2023-06-19 DIAGNOSIS — D473 Essential (hemorrhagic) thrombocythemia: Secondary | ICD-10-CM | POA: Diagnosis not present

## 2023-06-19 DIAGNOSIS — Z87891 Personal history of nicotine dependence: Secondary | ICD-10-CM | POA: Diagnosis not present

## 2023-06-19 LAB — CMP (CANCER CENTER ONLY)
ALT: 16 U/L (ref 0–44)
AST: 12 U/L — ABNORMAL LOW (ref 15–41)
Albumin: 4.3 g/dL (ref 3.5–5.0)
Alkaline Phosphatase: 70 U/L (ref 38–126)
Anion gap: 12 (ref 5–15)
BUN: 10 mg/dL (ref 8–23)
CO2: 28 mmol/L (ref 22–32)
Calcium: 9.1 mg/dL (ref 8.9–10.3)
Chloride: 94 mmol/L — ABNORMAL LOW (ref 98–111)
Creatinine: 0.59 mg/dL — ABNORMAL LOW (ref 0.61–1.24)
GFR, Estimated: >60 mL/min (ref >60)
Glucose, Bld: 156 mg/dL — ABNORMAL HIGH (ref 70–99)
Potassium: 4.1 mmol/L (ref 3.5–5.1)
Sodium: 134 mmol/L — ABNORMAL LOW (ref 135–145)
Total Bilirubin: 0.3 mg/dL (ref 0.0–1.2)
Total Protein: 6.4 g/dL — ABNORMAL LOW (ref 6.5–8.1)

## 2023-06-19 LAB — CBC WITH DIFFERENTIAL (CANCER CENTER ONLY)
Abs Immature Granulocytes: 0.09 10*3/uL — ABNORMAL HIGH (ref 0.00–0.07)
Basophils Absolute: 0.1 10*3/uL (ref 0.0–0.1)
Basophils Relative: 2 %
Eosinophils Absolute: 0.1 10*3/uL (ref 0.0–0.5)
Eosinophils Relative: 1 %
HCT: 42.2 % (ref 39.0–52.0)
Hemoglobin: 13.5 g/dL (ref 13.0–17.0)
Immature Granulocytes: 1 %
Lymphocytes Relative: 16 %
Lymphs Abs: 1.2 10*3/uL (ref 0.7–4.0)
MCH: 30.5 pg (ref 26.0–34.0)
MCHC: 32 g/dL (ref 30.0–36.0)
MCV: 95.3 fL (ref 80.0–100.0)
Monocytes Absolute: 0.4 10*3/uL (ref 0.1–1.0)
Monocytes Relative: 6 %
Neutro Abs: 5.8 10*3/uL (ref 1.7–7.7)
Neutrophils Relative %: 74 %
Platelet Count: 725 10*3/uL — ABNORMAL HIGH (ref 150–400)
RBC: 4.43 MIL/uL (ref 4.22–5.81)
RDW: 18.9 % — ABNORMAL HIGH (ref 11.5–15.5)
WBC Count: 7.7 10*3/uL (ref 4.0–10.5)
nRBC: 0 % (ref 0.0–0.2)

## 2023-06-19 MED ORDER — HYDROXYUREA 500 MG PO CAPS
1000.0000 mg | ORAL_CAPSULE | Freq: Every day | ORAL | 0 refills | Status: DC
  Filled 2023-06-19: qty 120, 60d supply, fill #0

## 2023-06-19 NOTE — Progress Notes (Signed)
  [facility] OFFICE PROGRESS NOTE   Diagnosis: CLL, essential thrombocytosis  INTERVAL HISTORY:   Mr. Gaffey Returns as scheduled.  He continues  acalabrutinib and hydroxyurea.  No fever, night sweats, mouth sores, bleeding, or symptom of thrombosis.  No palpable lymph nodes.  Good appetite.  Objective:  Vital signs in last 24 hours:  Blood pressure 131/73, pulse 87, temperature 98.2 F (36.8 C), temperature source Temporal, resp. rate 18, height 6' (1.829 m), weight 208 lb (94.3 kg), SpO2 100%.    HEENT: No thrush or ulcers Lymphatics: No cervical or supraclavicular nodes Resp: Lungs clear bilaterally Cardio: Regular rate and rhythm GI: No hepatosplenomegaly, no mass, nontender Vascular: No left leg edema, mild erythema to left lower leg   Lab Results:  Lab Results  Component Value Date   WBC 7.7 06/19/2023   HGB 13.5 06/19/2023   HCT 42.2 06/19/2023   MCV 95.3 06/19/2023   PLT 725 (H) 06/19/2023   NEUTROABS 5.8 06/19/2023    CMP  Lab Results  Component Value Date   NA 134 (L) 06/19/2023   K 4.1 06/19/2023   CL 94 (L) 06/19/2023   CO2 28 06/19/2023   GLUCOSE 156 (H) 06/19/2023   BUN 10 06/19/2023   CREATININE 0.59 (L) 06/19/2023   CALCIUM 9.1 06/19/2023   PROT 6.4 (L) 06/19/2023   ALBUMIN 4.3 06/19/2023   AST 12 (L) 06/19/2023   ALT 16 06/19/2023   ALKPHOS 70 06/19/2023   BILITOT 0.3 06/19/2023   GFRNONAA >60 06/19/2023   GFRAA >60 01/08/2020     Medications: I have reviewed the patient's current medications.   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   Right lower extremity and ileal caval DVT 07/29/2021-mechanical thrombectomy Bilateral  lobar and segmental pulmonary embolism with evidence of borderline right heart strain 08/08/2021 CT chest 08/26/2021-negative for pulmonary embolism, previously noted pulmonary emboli no longer seen Apixaban anticoagulation, discontinued when he developed gross hematuria IVC filter placed 08/30/2021  4.  Urinary retention following amputation surgery 2023, Foley catheter in place 5.  BPH 6.  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  Hypertension 8.  Essential thrombocytosis Positive JAK2 V617 on peripheral blood 12/02/2022 Hydroxyurea 500 mg daily 02/24/2023 Hydroxyurea 500 mg daily, 1000 mg M, W, F 03/28/2023 Hydroxyurea 1000 mg daily Monday-Friday, 500 mg Saturday/Sunday, 05/25/2023 Hydroxyurea 1000 mg daily 06/19/2023       Disposition: Mr Spellman appears stable.  The platelet count remains above goal range.  The hydroxyurea was increased to 1000 mg daily beginning today.  He will return for lab visit in 1 month and an office visit in 2 months.  He continues  acalabrutinib.  Thornton Papas, MD  06/19/2023  11:33 AM

## 2023-06-23 ENCOUNTER — Other Ambulatory Visit: Payer: Self-pay

## 2023-07-10 ENCOUNTER — Other Ambulatory Visit: Payer: Self-pay

## 2023-07-10 ENCOUNTER — Other Ambulatory Visit: Payer: Self-pay | Admitting: Pharmacy Technician

## 2023-07-10 NOTE — Progress Notes (Signed)
 Specialty Pharmacy Refill Coordination Note  Douglas [name] is a 73 y.o. male contacted today regarding refills of specialty medication(s) Acalabrutinib Maleate (Calquence)  Spoke with Wife  Patient requested Delivery   Delivery date: 07/18/23   Verified address: [address]  [place]   Medication will be filled on 07/17/23.

## 2023-07-17 ENCOUNTER — Other Ambulatory Visit: Payer: Self-pay

## 2023-07-18 ENCOUNTER — Inpatient Hospital Stay: Attending: Oncology

## 2023-07-18 ENCOUNTER — Telehealth: Payer: Self-pay | Admitting: *Deleted

## 2023-07-18 ENCOUNTER — Ambulatory Visit (INDEPENDENT_AMBULATORY_CARE_PROVIDER_SITE_OTHER): Payer: 59

## 2023-07-18 DIAGNOSIS — I441 Atrioventricular block, second degree: Secondary | ICD-10-CM | POA: Diagnosis not present

## 2023-07-18 DIAGNOSIS — C9110 Chronic lymphocytic leukemia of B-cell type not having achieved remission: Secondary | ICD-10-CM

## 2023-07-18 DIAGNOSIS — C9111 Chronic lymphocytic leukemia of B-cell type in remission: Secondary | ICD-10-CM | POA: Insufficient documentation

## 2023-07-18 LAB — CBC WITH DIFFERENTIAL (CANCER CENTER ONLY)
Abs Immature Granulocytes: 0.09 10*3/uL — ABNORMAL HIGH (ref 0.00–0.07)
Basophils Absolute: 0.1 10*3/uL (ref 0.0–0.1)
Basophils Relative: 2 %
Eosinophils Absolute: 0.1 10*3/uL (ref 0.0–0.5)
Eosinophils Relative: 1 %
HCT: 46 % (ref 39.0–52.0)
Hemoglobin: 15 g/dL (ref 13.0–17.0)
Immature Granulocytes: 1 %
Lymphocytes Relative: 16 %
Lymphs Abs: 1.4 10*3/uL (ref 0.7–4.0)
MCH: 31.7 pg (ref 26.0–34.0)
MCHC: 32.6 g/dL (ref 30.0–36.0)
MCV: 97.3 fL (ref 80.0–100.0)
Monocytes Absolute: 0.5 10*3/uL (ref 0.1–1.0)
Monocytes Relative: 5 %
Neutro Abs: 6.6 10*3/uL (ref 1.7–7.7)
Neutrophils Relative %: 75 %
Platelet Count: 683 10*3/uL — ABNORMAL HIGH (ref 150–400)
RBC: 4.73 MIL/uL (ref 4.22–5.81)
RDW: 18 % — ABNORMAL HIGH (ref 11.5–15.5)
WBC Count: 8.8 10*3/uL (ref 4.0–10.5)
nRBC: 0 % (ref 0.0–0.2)

## 2023-07-18 NOTE — Telephone Encounter (Signed)
-----   Message from Thornton Papas sent at 07/18/2023  4:12 PM EDT ----- Please call patient, the platelet count remains elevated, increase the hydroxyurea to 1500 mg on Monday and Thursday, 1000 mg other days, follow-up as scheduled in 4 weeks

## 2023-07-18 NOTE — Telephone Encounter (Signed)
 Notified that per Dr. Truett Perna platelets are still elevated. Wants to increase Hydrea to 1500 mg M & TH and continue 1000 mg daily all other days. F/U as scheduled. He agrees.

## 2023-07-20 LAB — CUP PACEART REMOTE DEVICE CHECK
Battery Remaining Longevity: 126 mo
Battery Voltage: 3.02 V
Brady Statistic AP VP Percent: 1.51 %
Brady Statistic AP VS Percent: 0 %
Brady Statistic AS VP Percent: 95.59 %
Brady Statistic AS VS Percent: 2.9 %
Brady Statistic RA Percent Paced: 1.83 %
Brady Statistic RV Percent Paced: 97.1 %
Date Time Interrogation Session: 20250409173122
Implantable Lead Connection Status: 753985
Implantable Lead Connection Status: 753985
Implantable Lead Implant Date: 20221011
Implantable Lead Implant Date: 20221011
Implantable Lead Location: 753859
Implantable Lead Location: 753860
Implantable Lead Model: 3830
Implantable Lead Model: 5076
Implantable Pulse Generator Implant Date: 20221011
Lead Channel Impedance Value: 361 Ohm
Lead Channel Impedance Value: 380 Ohm
Lead Channel Impedance Value: 475 Ohm
Lead Channel Impedance Value: 551 Ohm
Lead Channel Pacing Threshold Amplitude: 0.625 V
Lead Channel Pacing Threshold Amplitude: 0.75 V
Lead Channel Pacing Threshold Pulse Width: 0.4 ms
Lead Channel Pacing Threshold Pulse Width: 0.4 ms
Lead Channel Sensing Intrinsic Amplitude: 3.875 mV
Lead Channel Sensing Intrinsic Amplitude: 3.875 mV
Lead Channel Sensing Intrinsic Amplitude: 7.25 mV
Lead Channel Sensing Intrinsic Amplitude: 7.25 mV
Lead Channel Setting Pacing Amplitude: 1.5 V
Lead Channel Setting Pacing Amplitude: 2 V
Lead Channel Setting Pacing Pulse Width: 0.4 ms
Lead Channel Setting Sensing Sensitivity: 0.9 mV
Zone Setting Status: 755011
Zone Setting Status: 755011

## 2023-07-25 ENCOUNTER — Encounter: Payer: Self-pay | Admitting: Internal Medicine

## 2023-08-10 ENCOUNTER — Encounter (HOSPITAL_COMMUNITY): Payer: Self-pay

## 2023-08-10 ENCOUNTER — Other Ambulatory Visit (HOSPITAL_COMMUNITY): Payer: Self-pay

## 2023-08-14 ENCOUNTER — Other Ambulatory Visit: Payer: Self-pay | Admitting: *Deleted

## 2023-08-14 ENCOUNTER — Inpatient Hospital Stay

## 2023-08-14 ENCOUNTER — Inpatient Hospital Stay: Attending: Oncology | Admitting: Oncology

## 2023-08-14 ENCOUNTER — Other Ambulatory Visit (HOSPITAL_BASED_OUTPATIENT_CLINIC_OR_DEPARTMENT_OTHER): Payer: Self-pay

## 2023-08-14 VITALS — BP 134/66 | HR 85 | Temp 98.1°F | Resp 18 | Ht 72.0 in | Wt 215.0 lb

## 2023-08-14 DIAGNOSIS — Z950 Presence of cardiac pacemaker: Secondary | ICD-10-CM | POA: Diagnosis not present

## 2023-08-14 DIAGNOSIS — C9111 Chronic lymphocytic leukemia of B-cell type in remission: Secondary | ICD-10-CM | POA: Diagnosis present

## 2023-08-14 DIAGNOSIS — E119 Type 2 diabetes mellitus without complications: Secondary | ICD-10-CM | POA: Diagnosis not present

## 2023-08-14 DIAGNOSIS — D473 Essential (hemorrhagic) thrombocythemia: Secondary | ICD-10-CM | POA: Insufficient documentation

## 2023-08-14 DIAGNOSIS — C9110 Chronic lymphocytic leukemia of B-cell type not having achieved remission: Secondary | ICD-10-CM | POA: Diagnosis not present

## 2023-08-14 LAB — CBC WITH DIFFERENTIAL (CANCER CENTER ONLY)
Abs Immature Granulocytes: 0.06 10*3/uL (ref 0.00–0.07)
Basophils Absolute: 0.1 10*3/uL (ref 0.0–0.1)
Basophils Relative: 2 %
Eosinophils Absolute: 0.1 10*3/uL (ref 0.0–0.5)
Eosinophils Relative: 1 %
HCT: 44 % (ref 39.0–52.0)
Hemoglobin: 14.9 g/dL (ref 13.0–17.0)
Immature Granulocytes: 1 %
Lymphocytes Relative: 18 %
Lymphs Abs: 1.2 10*3/uL (ref 0.7–4.0)
MCH: 33 pg (ref 26.0–34.0)
MCHC: 33.9 g/dL (ref 30.0–36.0)
MCV: 97.6 fL (ref 80.0–100.0)
Monocytes Absolute: 0.4 10*3/uL (ref 0.1–1.0)
Monocytes Relative: 6 %
Neutro Abs: 5 10*3/uL (ref 1.7–7.7)
Neutrophils Relative %: 72 %
Platelet Count: 597 10*3/uL — ABNORMAL HIGH (ref 150–400)
RBC: 4.51 MIL/uL (ref 4.22–5.81)
RDW: 18.2 % — ABNORMAL HIGH (ref 11.5–15.5)
WBC Count: 6.9 10*3/uL (ref 4.0–10.5)
nRBC: 0 % (ref 0.0–0.2)

## 2023-08-14 LAB — CMP (CANCER CENTER ONLY)
ALT: 20 U/L (ref 0–44)
AST: 20 U/L (ref 15–41)
Albumin: 4.3 g/dL (ref 3.5–5.0)
Alkaline Phosphatase: 76 U/L (ref 38–126)
Anion gap: 14 (ref 5–15)
BUN: 15 mg/dL (ref 8–23)
CO2: 26 mmol/L (ref 22–32)
Calcium: 9.7 mg/dL (ref 8.9–10.3)
Chloride: 96 mmol/L — ABNORMAL LOW (ref 98–111)
Creatinine: 0.67 mg/dL (ref 0.61–1.24)
GFR, Estimated: >60 mL/min (ref >60)
Glucose, Bld: 171 mg/dL — ABNORMAL HIGH (ref 70–99)
Potassium: 4.2 mmol/L (ref 3.5–5.1)
Sodium: 136 mmol/L (ref 135–145)
Total Bilirubin: 0.2 mg/dL (ref 0.0–1.2)
Total Protein: 6.3 g/dL — ABNORMAL LOW (ref 6.5–8.1)

## 2023-08-14 MED ORDER — HYDROXYUREA 500 MG PO CAPS
1000.0000 mg | ORAL_CAPSULE | Freq: Every day | ORAL | 1 refills | Status: DC
  Filled 2023-08-14: qty 216, 90d supply, fill #0

## 2023-08-14 NOTE — Progress Notes (Signed)
  [facility] OFFICE PROGRESS NOTE   Diagnosis: CLL, essential thrombocytosis  INTERVAL HISTORY:   Douglas [name] returns as scheduled.  He continues hydroxyurea .  He is now taking Medrox urea at a dose of 1000 mg daily except for 1500 mg on Monday and Thursday.  No rash, nausea, bleeding, symptom of thrombosis, or mouth ulcers.  No fever or night sweats.  Mild bone pain.  Objective:  Vital signs in last 24 hours:  Blood pressure 134/66, pulse 85, temperature 98.1 F (36.7 C), temperature source Temporal, resp. rate 18, height 6' (1.829 m), weight 215 lb (97.5 kg), SpO2 100%.    HEENT: No thrush or ulcers Lymphatics: No cervical, supraclavicular, axillary, or inguinal nodes Resp: Lungs clear bilaterally Cardio: Regular rhythm with premature beats GI: No hepatosplenomegaly Vascular: Left leg edema   Lab Results:  Lab Results  Component Value Date   WBC 6.9 08/14/2023   HGB 14.9 08/14/2023   HCT 44.0 08/14/2023   MCV 97.6 08/14/2023   PLT 597 (H) 08/14/2023   NEUTROABS 5.0 08/14/2023    CMP  Lab Results  Component Value Date   NA 136 08/14/2023   K 4.2 08/14/2023   CL 96 (L) 08/14/2023   CO2 26 08/14/2023   GLUCOSE 171 (H) 08/14/2023   BUN 15 08/14/2023   CREATININE 0.67 08/14/2023   CALCIUM  9.7 08/14/2023   PROT 6.3 (L) 08/14/2023   ALBUMIN  4.3 08/14/2023   AST 20 08/14/2023   ALT 20 08/14/2023   ALKPHOS 76 08/14/2023   BILITOT 0.2 08/14/2023   GFRNONAA >60 08/14/2023   GFRAA >60 01/08/2020     Medications: I have reviewed the patient's current medications.   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   Right lower extremity and ileal caval DVT  07/29/2021-mechanical thrombectomy Bilateral lobar and segmental pulmonary embolism with evidence of borderline right heart strain 08/08/2021 CT chest 08/26/2021-negative for pulmonary embolism, previously noted pulmonary emboli no longer seen Apixaban  anticoagulation, discontinued when he developed gross hematuria IVC filter placed 08/30/2021  4.  Urinary retention following amputation surgery 2023, Foley catheter in place 5.  BPH 6.  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  Hypertension 8.  Essential thrombocytosis Positive JAK2 V617 on peripheral blood 12/02/2022 Hydroxyurea  500 mg daily 02/24/2023 Hydroxyurea  500 mg daily, 1000 mg M, W, F 03/28/2023 Hydroxyurea  1000 mg daily Monday-Friday, 500 mg Saturday/Sunday, 05/25/2023 Hydroxyurea  1000 mg daily 06/19/2023 Hydroxyurea  1000 mg daily, 1500 mg Monday and Thursday, 07/18/2023 Hydroxyurea  1000 mg daily, 1500 mg Monday, Wednesday, and Friday, 08/14/2023       Disposition: Douglas [name] remains in clinical remission from CLL.  He continues acalabrutinib .  He is maintained on hydroxyurea  for treatment of essential thrombocytosis.  The platelet count is lower today, but remains above goal range.  He will increase hydroxyurea  dose to 1000 mg daily except for 1500 mg on Monday, Wednesday, and Friday.  He will return for a CBC in 1 month and 2 months.  He will be scheduled for a 51-month office visit.  Coni Deep, MD  08/14/2023  11:06 AM

## 2023-08-15 ENCOUNTER — Telehealth: Payer: Self-pay | Admitting: Oncology

## 2023-08-15 ENCOUNTER — Other Ambulatory Visit: Payer: Self-pay

## 2023-08-15 NOTE — Progress Notes (Signed)
 Specialty Pharmacy Refill Coordination Note  Douglas [name] is a 73 y.o. male contacted today regarding refills of specialty medication(s) Acalabrutinib  Maleate (Calquence )   Patient requested Delivery   Delivery date: 08/21/23   Verified address: [address]  [place]   Medication will be filled on 08/18/23.

## 2023-08-15 NOTE — Telephone Encounter (Signed)
 Patient has been scheduled for follow-up visit per 08/14/23 LOS.  LVM notifying pt of appt details, provided my direct number to pt if appt changes need to be made.

## 2023-08-18 ENCOUNTER — Other Ambulatory Visit: Payer: Self-pay

## 2023-09-05 NOTE — Progress Notes (Signed)
 Remote pacemaker transmission.

## 2023-09-05 NOTE — Addendum Note (Signed)
 Addended by: Lott Rouleau A on: 09/05/2023 02:18 PM   Modules accepted: Orders

## 2023-09-14 ENCOUNTER — Other Ambulatory Visit

## 2023-09-18 ENCOUNTER — Other Ambulatory Visit: Payer: Self-pay

## 2023-09-18 ENCOUNTER — Other Ambulatory Visit: Payer: Self-pay | Admitting: Pharmacy Technician

## 2023-09-18 ENCOUNTER — Other Ambulatory Visit: Payer: Self-pay | Admitting: Oncology

## 2023-09-18 DIAGNOSIS — C9110 Chronic lymphocytic leukemia of B-cell type not having achieved remission: Secondary | ICD-10-CM

## 2023-09-18 NOTE — Progress Notes (Signed)
 Specialty Pharmacy Refill Coordination Note  Douglas [name] is a 73 y.o. male contacted today regarding refills of specialty medication(s) Acalabrutinib  Maleate (Calquence )   Patient requested Delivery   Delivery date: 09/21/23   Verified address: [address] [place], [place] [address]   Medication will be filled on 09/20/23.   This fill date is pending response to refill request from provider. Patient is aware and if they have not received fill by intended date they must follow up with pharmacy.   Spoke to patient's spouse.

## 2023-09-19 ENCOUNTER — Ambulatory Visit: Payer: Self-pay | Admitting: Oncology

## 2023-09-19 ENCOUNTER — Other Ambulatory Visit (HOSPITAL_COMMUNITY): Payer: Self-pay

## 2023-09-19 ENCOUNTER — Other Ambulatory Visit: Payer: Self-pay

## 2023-09-19 ENCOUNTER — Inpatient Hospital Stay: Attending: Oncology

## 2023-09-19 DIAGNOSIS — C9110 Chronic lymphocytic leukemia of B-cell type not having achieved remission: Secondary | ICD-10-CM | POA: Insufficient documentation

## 2023-09-19 LAB — CBC WITH DIFFERENTIAL (CANCER CENTER ONLY)
Abs Immature Granulocytes: 0.04 10*3/uL (ref 0.00–0.07)
Basophils Absolute: 0.1 10*3/uL (ref 0.0–0.1)
Basophils Relative: 2 %
Eosinophils Absolute: 0.1 10*3/uL (ref 0.0–0.5)
Eosinophils Relative: 1 %
HCT: 46.1 % (ref 39.0–52.0)
Hemoglobin: 15.5 g/dL (ref 13.0–17.0)
Immature Granulocytes: 1 %
Lymphocytes Relative: 20 %
Lymphs Abs: 1.3 10*3/uL (ref 0.7–4.0)
MCH: 33.9 pg (ref 26.0–34.0)
MCHC: 33.6 g/dL (ref 30.0–36.0)
MCV: 100.9 fL — ABNORMAL HIGH (ref 80.0–100.0)
Monocytes Absolute: 0.3 10*3/uL (ref 0.1–1.0)
Monocytes Relative: 5 %
Neutro Abs: 4.4 10*3/uL (ref 1.7–7.7)
Neutrophils Relative %: 71 %
Platelet Count: 605 10*3/uL — ABNORMAL HIGH (ref 150–400)
RBC: 4.57 MIL/uL (ref 4.22–5.81)
RDW: 18.6 % — ABNORMAL HIGH (ref 11.5–15.5)
WBC Count: 6.2 10*3/uL (ref 4.0–10.5)
nRBC: 0 % (ref 0.0–0.2)

## 2023-09-19 MED ORDER — CALQUENCE 100 MG PO TABS
100.0000 mg | ORAL_TABLET | Freq: Two times a day (BID) | ORAL | 2 refills | Status: DC
  Filled 2023-09-19: qty 60, 30d supply, fill #0
  Filled 2023-10-16: qty 60, 30d supply, fill #1
  Filled 2023-11-15: qty 60, 30d supply, fill #2

## 2023-09-19 NOTE — Telephone Encounter (Signed)
-----   Message from Coni Deep sent at 09/19/2023  4:35 PM EDT ----- Please call patient, the platelet count is stable, continue hydroxyurea -same dose, f/u as scheduled, we will consider increasing the hydroxyurea  dose if the platelets remain elevated at the next lab

## 2023-09-19 NOTE — Telephone Encounter (Signed)
 The patient verbally acknowledged understanding and reported weight gain following each increase in the hydroxyurea  dosage.

## 2023-10-06 ENCOUNTER — Other Ambulatory Visit (HOSPITAL_COMMUNITY): Payer: Self-pay

## 2023-10-11 ENCOUNTER — Other Ambulatory Visit (HOSPITAL_COMMUNITY): Payer: Self-pay

## 2023-10-16 ENCOUNTER — Other Ambulatory Visit: Payer: Self-pay

## 2023-10-16 ENCOUNTER — Other Ambulatory Visit: Payer: Self-pay | Admitting: Pharmacy Technician

## 2023-10-16 ENCOUNTER — Other Ambulatory Visit

## 2023-10-16 NOTE — Progress Notes (Signed)
 Specialty Pharmacy Refill Coordination Note  Navraj Dreibelbis is a 73 y.o. male contacted today regarding refills of specialty medication(s) Acalabrutinib  Maleate (Calquence )  Spoke with Wife  Patient requested Delivery   Delivery date: 10/19/23   Verified address: Patient address [address]  [name] [address]   Medication will be filled on 10/18/2023.

## 2023-10-17 ENCOUNTER — Ambulatory Visit (INDEPENDENT_AMBULATORY_CARE_PROVIDER_SITE_OTHER): Payer: 59

## 2023-10-17 ENCOUNTER — Other Ambulatory Visit: Payer: Self-pay

## 2023-10-17 DIAGNOSIS — I441 Atrioventricular block, second degree: Secondary | ICD-10-CM | POA: Diagnosis not present

## 2023-10-18 ENCOUNTER — Inpatient Hospital Stay: Attending: Oncology

## 2023-10-18 DIAGNOSIS — C9110 Chronic lymphocytic leukemia of B-cell type not having achieved remission: Secondary | ICD-10-CM | POA: Diagnosis present

## 2023-10-18 LAB — CBC WITH DIFFERENTIAL (CANCER CENTER ONLY)
Abs Immature Granulocytes: 0.05 K/uL (ref 0.00–0.07)
Basophils Absolute: 0.1 K/uL (ref 0.0–0.1)
Basophils Relative: 1 %
Eosinophils Absolute: 0 K/uL (ref 0.0–0.5)
Eosinophils Relative: 1 %
HCT: 44.9 % (ref 39.0–52.0)
Hemoglobin: 15.3 g/dL (ref 13.0–17.0)
Immature Granulocytes: 1 %
Lymphocytes Relative: 16 %
Lymphs Abs: 1.2 K/uL (ref 0.7–4.0)
MCH: 35.2 pg — ABNORMAL HIGH (ref 26.0–34.0)
MCHC: 34.1 g/dL (ref 30.0–36.0)
MCV: 103.2 fL — ABNORMAL HIGH (ref 80.0–100.0)
Monocytes Absolute: 0.3 K/uL (ref 0.1–1.0)
Monocytes Relative: 4 %
Neutro Abs: 6.1 K/uL (ref 1.7–7.7)
Neutrophils Relative %: 77 %
Platelet Count: 619 K/uL — ABNORMAL HIGH (ref 150–400)
RBC: 4.35 MIL/uL (ref 4.22–5.81)
RDW: 18.2 % — ABNORMAL HIGH (ref 11.5–15.5)
WBC Count: 7.8 K/uL (ref 4.0–10.5)
nRBC: 0 % (ref 0.0–0.2)

## 2023-10-19 ENCOUNTER — Ambulatory Visit: Payer: Self-pay | Admitting: Internal Medicine

## 2023-10-19 LAB — CUP PACEART REMOTE DEVICE CHECK
Battery Remaining Longevity: 121 mo
Battery Voltage: 3.02 V
Brady Statistic AP VP Percent: 3.6 %
Brady Statistic AP VS Percent: 0 %
Brady Statistic AS VP Percent: 94.03 %
Brady Statistic AS VS Percent: 2.37 %
Brady Statistic RA Percent Paced: 3.63 %
Brady Statistic RV Percent Paced: 97.63 %
Date Time Interrogation Session: 20250707220658
Implantable Lead Connection Status: 753985
Implantable Lead Connection Status: 753985
Implantable Lead Implant Date: 20221011
Implantable Lead Implant Date: 20221011
Implantable Lead Location: 753859
Implantable Lead Location: 753860
Implantable Lead Model: 3830
Implantable Lead Model: 5076
Implantable Pulse Generator Implant Date: 20221011
Lead Channel Impedance Value: 342 Ohm
Lead Channel Impedance Value: 380 Ohm
Lead Channel Impedance Value: 475 Ohm
Lead Channel Impedance Value: 513 Ohm
Lead Channel Pacing Threshold Amplitude: 0.625 V
Lead Channel Pacing Threshold Amplitude: 0.75 V
Lead Channel Pacing Threshold Pulse Width: 0.4 ms
Lead Channel Pacing Threshold Pulse Width: 0.4 ms
Lead Channel Sensing Intrinsic Amplitude: 2.625 mV
Lead Channel Sensing Intrinsic Amplitude: 2.625 mV
Lead Channel Sensing Intrinsic Amplitude: 3.625 mV
Lead Channel Sensing Intrinsic Amplitude: 3.625 mV
Lead Channel Setting Pacing Amplitude: 1.5 V
Lead Channel Setting Pacing Amplitude: 2 V
Lead Channel Setting Pacing Pulse Width: 0.4 ms
Lead Channel Setting Sensing Sensitivity: 0.9 mV
Zone Setting Status: 755011
Zone Setting Status: 755011

## 2023-11-13 ENCOUNTER — Other Ambulatory Visit: Payer: Self-pay

## 2023-11-14 ENCOUNTER — Ambulatory Visit: Admitting: Oncology

## 2023-11-14 ENCOUNTER — Other Ambulatory Visit

## 2023-11-15 ENCOUNTER — Other Ambulatory Visit: Payer: Self-pay

## 2023-11-15 ENCOUNTER — Other Ambulatory Visit (HOSPITAL_COMMUNITY): Payer: Self-pay

## 2023-11-15 NOTE — Progress Notes (Signed)
 Specialty Pharmacy Refill Coordination Note  Spoke with Avitia,Dorothy (Wife)  Eben Choinski is a 73 y.o. male contacted today regarding refills of specialty medication(s) Acalabrutinib  Maleate (Calquence )  Doses on hand: 22 tablets (11 days)  Patient requested: Delivery   Delivery date: 11/17/23   Verified address: [address] [place]  [place] [address]  Medication will be filled on 11/16/23.

## 2023-11-21 ENCOUNTER — Other Ambulatory Visit (HOSPITAL_BASED_OUTPATIENT_CLINIC_OR_DEPARTMENT_OTHER): Payer: Self-pay

## 2023-11-21 ENCOUNTER — Other Ambulatory Visit: Payer: Self-pay

## 2023-11-21 ENCOUNTER — Inpatient Hospital Stay (HOSPITAL_BASED_OUTPATIENT_CLINIC_OR_DEPARTMENT_OTHER): Admitting: Oncology

## 2023-11-21 ENCOUNTER — Inpatient Hospital Stay: Attending: Oncology

## 2023-11-21 VITALS — BP 142/77 | HR 60 | Temp 98.7°F | Resp 18 | Ht 72.0 in | Wt 217.0 lb

## 2023-11-21 DIAGNOSIS — Z950 Presence of cardiac pacemaker: Secondary | ICD-10-CM | POA: Diagnosis not present

## 2023-11-21 DIAGNOSIS — I739 Peripheral vascular disease, unspecified: Secondary | ICD-10-CM | POA: Diagnosis not present

## 2023-11-21 DIAGNOSIS — Z89511 Acquired absence of right leg below knee: Secondary | ICD-10-CM | POA: Insufficient documentation

## 2023-11-21 DIAGNOSIS — Z89611 Acquired absence of right leg above knee: Secondary | ICD-10-CM | POA: Diagnosis not present

## 2023-11-21 DIAGNOSIS — D473 Essential (hemorrhagic) thrombocythemia: Secondary | ICD-10-CM | POA: Diagnosis not present

## 2023-11-21 DIAGNOSIS — C9110 Chronic lymphocytic leukemia of B-cell type not having achieved remission: Secondary | ICD-10-CM

## 2023-11-21 DIAGNOSIS — I10 Essential (primary) hypertension: Secondary | ICD-10-CM | POA: Diagnosis not present

## 2023-11-21 DIAGNOSIS — I2602 Saddle embolus of pulmonary artery with acute cor pulmonale: Secondary | ICD-10-CM

## 2023-11-21 LAB — CBC WITH DIFFERENTIAL (CANCER CENTER ONLY)
Abs Immature Granulocytes: 0.07 K/uL (ref 0.00–0.07)
Basophils Absolute: 0.1 K/uL (ref 0.0–0.1)
Basophils Relative: 2 %
Eosinophils Absolute: 0.1 K/uL (ref 0.0–0.5)
Eosinophils Relative: 1 %
HCT: 46.1 % (ref 39.0–52.0)
Hemoglobin: 16.1 g/dL (ref 13.0–17.0)
Immature Granulocytes: 1 %
Lymphocytes Relative: 16 %
Lymphs Abs: 1.2 K/uL (ref 0.7–4.0)
MCH: 37.6 pg — ABNORMAL HIGH (ref 26.0–34.0)
MCHC: 34.9 g/dL (ref 30.0–36.0)
MCV: 107.7 fL — ABNORMAL HIGH (ref 80.0–100.0)
Monocytes Absolute: 0.4 K/uL (ref 0.1–1.0)
Monocytes Relative: 5 %
Neutro Abs: 5.6 K/uL (ref 1.7–7.7)
Neutrophils Relative %: 75 %
Platelet Count: 610 K/uL — ABNORMAL HIGH (ref 150–400)
RBC: 4.28 MIL/uL (ref 4.22–5.81)
RDW: 18.2 % — ABNORMAL HIGH (ref 11.5–15.5)
WBC Count: 7.5 K/uL (ref 4.0–10.5)
nRBC: 0 % (ref 0.0–0.2)

## 2023-11-21 MED ORDER — HYDROXYUREA 500 MG PO CAPS
1000.0000 mg | ORAL_CAPSULE | Freq: Every day | ORAL | 1 refills | Status: DC
  Filled 2023-11-21: qty 216, 89d supply, fill #0

## 2023-11-21 NOTE — Progress Notes (Signed)
  [facility] OFFICE PROGRESS NOTE   Diagnosis: CLL  INTERVAL HISTORY:   Douglas [name] returns as scheduled.  He continues hydroxyurea .  He has diarrhea approximately twice per week.  No bleeding or symptom of thrombosis.  He has intermittent night sweats.  He has an intermittent rash at the left lower leg  Objective:  Vital signs in last 24 hours:  Blood pressure (!) 142/77, pulse 60, temperature 98.7 F (37.1 C), temperature source Temporal, resp. rate 18, height 6' (1.829 m), weight 217 lb (98.4 kg), SpO2 98%.    HEENT: No thrush or ulcers Lymphatics: No cervical, supraclavicular, axillary, or inguinal nodes Resp: Lungs clear bilaterally Cardio: Regular rate and rhythm GI: No hepatosplenomegaly Vascular: No left leg edema  Skin: Erythematous rash at the groin bilaterally, demarcated plaques of erythema at the left lower leg   Lab Results:  Lab Results  Component Value Date   WBC 7.5 11/21/2023   HGB 16.1 11/21/2023   HCT 46.1 11/21/2023   MCV 107.7 (H) 11/21/2023   PLT 610 (H) 11/21/2023   NEUTROABS 5.6 11/21/2023    CMP  Lab Results  Component Value Date   NA 136 08/14/2023   K 4.2 08/14/2023   CL 96 (L) 08/14/2023   CO2 26 08/14/2023   GLUCOSE 171 (H) 08/14/2023   BUN 15 08/14/2023   CREATININE 0.67 08/14/2023   CALCIUM  9.7 08/14/2023   PROT 6.3 (L) 08/14/2023   ALBUMIN  4.3 08/14/2023   AST 20 08/14/2023   ALT 20 08/14/2023   ALKPHOS 76 08/14/2023   BILITOT 0.2 08/14/2023   GFRNONAA >60 08/14/2023   GFRAA >60 01/08/2020     Medications: I have reviewed the patient's current medications.   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   Right  lower extremity and ileal caval DVT 07/29/2021-mechanical thrombectomy Bilateral lobar and segmental pulmonary embolism with evidence of borderline right heart strain 08/08/2021 CT chest 08/26/2021-negative for pulmonary embolism, previously noted pulmonary emboli no longer seen Apixaban  anticoagulation, discontinued when he developed gross hematuria IVC filter placed 08/30/2021  4.  Urinary retention following amputation surgery 2023, Foley catheter in place 5.  BPH 6.  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  Hypertension 8.  Essential thrombocytosis Positive JAK2 V617 on peripheral blood 12/02/2022 Hydroxyurea  500 mg daily 02/24/2023 Hydroxyurea  500 mg daily, 1000 mg M, W, F 03/28/2023 Hydroxyurea  1000 mg daily Monday-Friday, 500 mg Saturday/Sunday, 05/25/2023 Hydroxyurea  1000 mg daily 06/19/2023 Hydroxyurea  1000 mg daily, 1500 mg Monday and Thursday, 07/18/2023 Hydroxyurea  1000 mg daily, 1500 mg Monday, Wednesday, and Friday, 08/14/2023      Disposition: Douglas [name] appears stable.  The platelet count remains above the goal range.  He does not wish to increase the hydroxyurea  dose.  He is tolerating the hydroxyurea  well.  He will continue hydroxyurea  at the current dose.  He will return for a lab visit in 6 weeks and an office visit in 12 weeks. I encouraged him to obtain an influenza and pneumococcal 20 or 21 vaccines.  Douglas Hof, MD  11/21/2023  11:05 AM

## 2023-11-28 ENCOUNTER — Other Ambulatory Visit: Payer: Self-pay

## 2023-11-28 ENCOUNTER — Other Ambulatory Visit (HOSPITAL_COMMUNITY): Payer: Self-pay

## 2023-11-28 NOTE — Progress Notes (Signed)
 Specialty Pharmacy Ongoing Clinical Assessment Note  Hill Baratta is a 73 y.o. male who is being followed by the specialty pharmacy service for RxSp Oncology   Patient's specialty medication(s) reviewed today: No data recorded  Missed doses in the last 4 weeks: 0   Patient/Caregiver did not have any additional questions or concerns.   Therapeutic benefit summary: Patient is achieving benefit   Adverse events/side effects summary: No adverse events/side effects   Patient's therapy is appropriate to: Continue    Goals Addressed             This Visit's Progress    Achieve or maintain remission   On track    Patient is on track. Patient will maintain adherence. Per provider note from 08/14/23 patient remains in remission.          Follow up: 6 months  [facility]

## 2023-12-08 ENCOUNTER — Other Ambulatory Visit: Payer: Self-pay

## 2023-12-08 ENCOUNTER — Other Ambulatory Visit: Payer: Self-pay | Admitting: Oncology

## 2023-12-08 DIAGNOSIS — C9110 Chronic lymphocytic leukemia of B-cell type not having achieved remission: Secondary | ICD-10-CM

## 2023-12-08 NOTE — Telephone Encounter (Signed)
 Will obtain OK from provider first

## 2023-12-12 ENCOUNTER — Other Ambulatory Visit: Payer: Self-pay

## 2023-12-12 MED ORDER — CALQUENCE 100 MG PO TABS
100.0000 mg | ORAL_TABLET | Freq: Two times a day (BID) | ORAL | 2 refills | Status: DC
  Filled 2023-12-12 (×2): qty 60, 30d supply, fill #0
  Filled 2024-01-08: qty 60, 30d supply, fill #1
  Filled 2024-02-02 – 2024-02-21 (×3): qty 60, 30d supply, fill #2

## 2023-12-12 NOTE — Progress Notes (Signed)
 Specialty Pharmacy Refill Coordination Note  Douglas [name] is a 73 y.o. male contacted today regarding refills of specialty medication(s) Acalabrutinib  Maleate (Calquence )   Patient requested Delivery   Delivery date: 12/14/23   Verified address: [address] [place]  [place] [address]   Medication will be filled on 12/13/23.

## 2023-12-13 ENCOUNTER — Other Ambulatory Visit: Payer: Self-pay

## 2023-12-13 ENCOUNTER — Other Ambulatory Visit (HOSPITAL_BASED_OUTPATIENT_CLINIC_OR_DEPARTMENT_OTHER): Payer: Self-pay

## 2023-12-13 MED ORDER — NYSTATIN 100000 UNIT/GM EX CREA
TOPICAL_CREAM | CUTANEOUS | 1 refills | Status: DC
  Filled 2023-12-13: qty 60, 30d supply, fill #0

## 2023-12-14 ENCOUNTER — Other Ambulatory Visit (HOSPITAL_BASED_OUTPATIENT_CLINIC_OR_DEPARTMENT_OTHER): Payer: Self-pay

## 2024-01-04 ENCOUNTER — Other Ambulatory Visit: Payer: Self-pay

## 2024-01-05 ENCOUNTER — Other Ambulatory Visit: Payer: Self-pay

## 2024-01-08 ENCOUNTER — Other Ambulatory Visit: Payer: Self-pay

## 2024-01-08 ENCOUNTER — Inpatient Hospital Stay: Attending: Oncology

## 2024-01-08 ENCOUNTER — Ambulatory Visit: Payer: Self-pay | Admitting: Oncology

## 2024-01-08 DIAGNOSIS — C9110 Chronic lymphocytic leukemia of B-cell type not having achieved remission: Secondary | ICD-10-CM | POA: Diagnosis present

## 2024-01-08 LAB — CBC WITH DIFFERENTIAL (CANCER CENTER ONLY)
Abs Immature Granulocytes: 0.09 K/uL — ABNORMAL HIGH (ref 0.00–0.07)
Basophils Absolute: 0.1 K/uL (ref 0.0–0.1)
Basophils Relative: 2 %
Eosinophils Absolute: 0.1 K/uL (ref 0.0–0.5)
Eosinophils Relative: 1 %
HCT: 44.1 % (ref 39.0–52.0)
Hemoglobin: 15.2 g/dL (ref 13.0–17.0)
Immature Granulocytes: 1 %
Lymphocytes Relative: 15 %
Lymphs Abs: 1.3 K/uL (ref 0.7–4.0)
MCH: 38.4 pg — ABNORMAL HIGH (ref 26.0–34.0)
MCHC: 34.5 g/dL (ref 30.0–36.0)
MCV: 111.4 fL — ABNORMAL HIGH (ref 80.0–100.0)
Monocytes Absolute: 0.4 K/uL (ref 0.1–1.0)
Monocytes Relative: 5 %
Neutro Abs: 6.2 K/uL (ref 1.7–7.7)
Neutrophils Relative %: 76 %
Platelet Count: 580 K/uL — ABNORMAL HIGH (ref 150–400)
RBC: 3.96 MIL/uL — ABNORMAL LOW (ref 4.22–5.81)
RDW: 15.9 % — ABNORMAL HIGH (ref 11.5–15.5)
WBC Count: 8.2 K/uL (ref 4.0–10.5)
nRBC: 0 % (ref 0.0–0.2)

## 2024-01-08 LAB — CMP (CANCER CENTER ONLY)
ALT: 27 U/L (ref 0–44)
AST: 23 U/L (ref 15–41)
Albumin: 3.9 g/dL (ref 3.5–5.0)
Alkaline Phosphatase: 76 U/L (ref 38–126)
Anion gap: 15 (ref 5–15)
BUN: 12 mg/dL (ref 8–23)
CO2: 23 mmol/L (ref 22–32)
Calcium: 9.8 mg/dL (ref 8.9–10.3)
Chloride: 96 mmol/L — ABNORMAL LOW (ref 98–111)
Creatinine: 0.7 mg/dL (ref 0.61–1.24)
GFR, Estimated: >60 mL/min (ref >60)
Glucose, Bld: 206 mg/dL — ABNORMAL HIGH (ref 70–99)
Potassium: 4 mmol/L (ref 3.5–5.1)
Sodium: 134 mmol/L — ABNORMAL LOW (ref 135–145)
Total Bilirubin: 0.3 mg/dL (ref 0.0–1.2)
Total Protein: 6.5 g/dL (ref 6.5–8.1)

## 2024-01-10 ENCOUNTER — Other Ambulatory Visit: Payer: Self-pay

## 2024-01-10 NOTE — Progress Notes (Signed)
 Specialty Pharmacy Refill Coordination Note  Douglas [name] is a 73 y.o. male contacted today regarding refills of specialty medication(s) Acalabrutinib  Maleate (Calquence )   Patient requested Delivery   Delivery date: 01/12/24   Verified address: [address] [place]  [place] [address]   Medication will be filled on 01/11/24.

## 2024-01-11 ENCOUNTER — Other Ambulatory Visit: Payer: Self-pay

## 2024-01-11 NOTE — Telephone Encounter (Signed)
 Notified that platelet count is lower. Stay on same Hydrea  dose and f/u as scheduled per Dr. Cloretta. He understands and agrees. Dose: 1000 mg daily, except 1500 mg MWF.

## 2024-01-16 ENCOUNTER — Ambulatory Visit: Payer: 59

## 2024-01-16 DIAGNOSIS — I441 Atrioventricular block, second degree: Secondary | ICD-10-CM | POA: Diagnosis not present

## 2024-01-18 LAB — CUP PACEART REMOTE DEVICE CHECK
Battery Remaining Longevity: 117 mo
Battery Voltage: 3.01 V
Brady Statistic AP VP Percent: 1.85 %
Brady Statistic AP VS Percent: 0 %
Brady Statistic AS VP Percent: 95.42 %
Brady Statistic AS VS Percent: 2.73 %
Brady Statistic RA Percent Paced: 1.87 %
Brady Statistic RV Percent Paced: 97.27 %
Date Time Interrogation Session: 20251007083722
Implantable Lead Connection Status: 753985
Implantable Lead Connection Status: 753985
Implantable Lead Implant Date: 20221011
Implantable Lead Implant Date: 20221011
Implantable Lead Location: 753859
Implantable Lead Location: 753860
Implantable Lead Model: 3830
Implantable Lead Model: 5076
Implantable Pulse Generator Implant Date: 20221011
Lead Channel Impedance Value: 342 Ohm
Lead Channel Impedance Value: 361 Ohm
Lead Channel Impedance Value: 437 Ohm
Lead Channel Impedance Value: 494 Ohm
Lead Channel Pacing Threshold Amplitude: 0.625 V
Lead Channel Pacing Threshold Amplitude: 0.75 V
Lead Channel Pacing Threshold Pulse Width: 0.4 ms
Lead Channel Pacing Threshold Pulse Width: 0.4 ms
Lead Channel Sensing Intrinsic Amplitude: 3 mV
Lead Channel Sensing Intrinsic Amplitude: 3 mV
Lead Channel Sensing Intrinsic Amplitude: 3.625 mV
Lead Channel Sensing Intrinsic Amplitude: 3.625 mV
Lead Channel Setting Pacing Amplitude: 1.5 V
Lead Channel Setting Pacing Amplitude: 2 V
Lead Channel Setting Pacing Pulse Width: 0.4 ms
Lead Channel Setting Sensing Sensitivity: 0.9 mV
Zone Setting Status: 755011
Zone Setting Status: 755011

## 2024-01-19 NOTE — Progress Notes (Signed)
 Remote PPM Transmission

## 2024-01-24 ENCOUNTER — Ambulatory Visit: Payer: Self-pay | Admitting: Internal Medicine

## 2024-02-02 ENCOUNTER — Other Ambulatory Visit (HOSPITAL_COMMUNITY): Payer: Self-pay

## 2024-02-06 ENCOUNTER — Other Ambulatory Visit (HOSPITAL_COMMUNITY): Payer: Self-pay

## 2024-02-08 ENCOUNTER — Other Ambulatory Visit: Payer: Self-pay

## 2024-02-12 ENCOUNTER — Other Ambulatory Visit (HOSPITAL_BASED_OUTPATIENT_CLINIC_OR_DEPARTMENT_OTHER): Payer: Self-pay

## 2024-02-12 ENCOUNTER — Inpatient Hospital Stay: Attending: Oncology

## 2024-02-12 ENCOUNTER — Other Ambulatory Visit: Payer: Self-pay

## 2024-02-12 ENCOUNTER — Inpatient Hospital Stay: Admitting: Oncology

## 2024-02-12 VITALS — BP 139/69 | HR 95 | Temp 98.1°F | Resp 18 | Ht 72.0 in | Wt 221.0 lb

## 2024-02-12 DIAGNOSIS — D75839 Thrombocytosis, unspecified: Secondary | ICD-10-CM

## 2024-02-12 DIAGNOSIS — C9110 Chronic lymphocytic leukemia of B-cell type not having achieved remission: Secondary | ICD-10-CM

## 2024-02-12 DIAGNOSIS — I10 Essential (primary) hypertension: Secondary | ICD-10-CM | POA: Diagnosis not present

## 2024-02-12 DIAGNOSIS — Z950 Presence of cardiac pacemaker: Secondary | ICD-10-CM | POA: Diagnosis not present

## 2024-02-12 DIAGNOSIS — I739 Peripheral vascular disease, unspecified: Secondary | ICD-10-CM | POA: Diagnosis not present

## 2024-02-12 DIAGNOSIS — Z89611 Acquired absence of right leg above knee: Secondary | ICD-10-CM | POA: Insufficient documentation

## 2024-02-12 DIAGNOSIS — Z86718 Personal history of other venous thrombosis and embolism: Secondary | ICD-10-CM | POA: Diagnosis not present

## 2024-02-12 DIAGNOSIS — I2602 Saddle embolus of pulmonary artery with acute cor pulmonale: Secondary | ICD-10-CM | POA: Diagnosis not present

## 2024-02-12 DIAGNOSIS — Z86711 Personal history of pulmonary embolism: Secondary | ICD-10-CM | POA: Diagnosis not present

## 2024-02-12 DIAGNOSIS — Z89511 Acquired absence of right leg below knee: Secondary | ICD-10-CM | POA: Diagnosis not present

## 2024-02-12 DIAGNOSIS — C9111 Chronic lymphocytic leukemia of B-cell type in remission: Secondary | ICD-10-CM | POA: Diagnosis present

## 2024-02-12 LAB — CBC WITH DIFFERENTIAL (CANCER CENTER ONLY)
Abs Immature Granulocytes: 0.13 K/uL — ABNORMAL HIGH (ref 0.00–0.07)
Basophils Absolute: 0.2 K/uL — ABNORMAL HIGH (ref 0.0–0.1)
Basophils Relative: 2 %
Eosinophils Absolute: 0.1 K/uL (ref 0.0–0.5)
Eosinophils Relative: 1 %
HCT: 44 % (ref 39.0–52.0)
Hemoglobin: 15.1 g/dL (ref 13.0–17.0)
Immature Granulocytes: 1 %
Lymphocytes Relative: 13 %
Lymphs Abs: 1.2 K/uL (ref 0.7–4.0)
MCH: 37.6 pg — ABNORMAL HIGH (ref 26.0–34.0)
MCHC: 34.3 g/dL (ref 30.0–36.0)
MCV: 109.5 fL — ABNORMAL HIGH (ref 80.0–100.0)
Monocytes Absolute: 0.5 K/uL (ref 0.1–1.0)
Monocytes Relative: 6 %
Neutro Abs: 7.4 K/uL (ref 1.7–7.7)
Neutrophils Relative %: 77 %
Platelet Count: 704 K/uL — ABNORMAL HIGH (ref 150–400)
RBC: 4.02 MIL/uL — ABNORMAL LOW (ref 4.22–5.81)
RDW: 14.5 % (ref 11.5–15.5)
WBC Count: 9.5 K/uL (ref 4.0–10.5)
nRBC: 0 % (ref 0.0–0.2)

## 2024-02-12 MED ORDER — CAPVAXIVE 0.5 ML IM SOSY
0.5000 mL | PREFILLED_SYRINGE | Freq: Once | INTRAMUSCULAR | 0 refills | Status: AC
  Filled 2024-02-12: qty 0.5, 1d supply, fill #0

## 2024-02-12 MED ORDER — HYDROXYUREA 500 MG PO CAPS
1500.0000 mg | ORAL_CAPSULE | Freq: Every day | ORAL | 1 refills | Status: DC
  Filled 2024-02-12: qty 180, 60d supply, fill #0

## 2024-02-12 NOTE — Progress Notes (Signed)
  [facility] OFFICE PROGRESS NOTE   Diagnosis: CLL, essential thrombocytosis  INTERVAL HISTORY:   Douglas [name] returns as scheduled.  He continues hydroxyurea .  He has diarrhea approximately twice per week.  No fever.  He feels hot at night.  No palpable lymph nodes.  No bleeding or symptom of thrombosis.  Objective:  Vital signs in last 24 hours:  Blood pressure 139/69, pulse 95, temperature 98.1 F (36.7 C), temperature source Temporal, resp. rate 18, height 6' (1.829 m), weight 221 lb (100.2 kg), SpO2 100%.    HEENT: No thrush or ulcers Lymphatics: No cervical, supraclavicular, axillary, or inguinal nodes Resp: Lungs clear bilaterally Cardio: Regular rate and rhythm GI: No hepatosplenomegaly Vascular: No left leg edema  Skin: Multiple sebaceous cysts over the back, inflamed cyst at the mid upper back   Lab Results:  Lab Results  Component Value Date   WBC 9.5 02/12/2024   HGB 15.1 02/12/2024   HCT 44.0 02/12/2024   MCV 109.5 (H) 02/12/2024   PLT 704 (H) 02/12/2024   NEUTROABS 7.4 02/12/2024    CMP  Lab Results  Component Value Date   NA 134 (L) 01/08/2024   K 4.0 01/08/2024   CL 96 (L) 01/08/2024   CO2 23 01/08/2024   GLUCOSE 206 (H) 01/08/2024   BUN 12 01/08/2024   CREATININE 0.70 01/08/2024   CALCIUM  9.8 01/08/2024   PROT 6.5 01/08/2024   ALBUMIN  3.9 01/08/2024   AST 23 01/08/2024   ALT 27 01/08/2024   ALKPHOS 76 01/08/2024   BILITOT 0.3 01/08/2024   GFRNONAA >60 01/08/2024   GFRAA >60 01/08/2020    Medications: I have reviewed the patient's current medications.   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  Urinary retention following amputation surgery 2023, Foley catheter in place 5.  BPH 6.  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  Hypertension 8.  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  The CLL is in clinical remission.  He continues acalabrutinib .  He is tolerating the acalabrutinib  well.  He has persistent thrombocytosis.  He appears to have essential thrombocytosis.  We decided to increase the hydroxyurea  dose to 1500 mg daily.  He will return for a lab visit in 4 weeks and an office visit in 8 weeks.  We will consider adding changed to anagrelide if the platelet count remains elevated on the increased dose of hydroxyurea .  I recommended he obtain influenza, COVID-19, and pneumococcal 21 vaccines.  Arley Hof, MD  02/12/2024  11:10 AM

## 2024-02-15 ENCOUNTER — Other Ambulatory Visit (HOSPITAL_BASED_OUTPATIENT_CLINIC_OR_DEPARTMENT_OTHER): Payer: Self-pay

## 2024-02-16 ENCOUNTER — Telehealth: Payer: Self-pay | Admitting: Pharmacy Technician

## 2024-02-16 NOTE — Telephone Encounter (Signed)
 Oral Oncology Patient Advocate Encounter  Was successful in securing patient a $8000 grant from [facility] to provide copayment coverage for Calquence .  This will keep the out of pocket expense at $0.     [employer] ID: [id number]   The billing information is as follows and has been shared with [facility].    RxBin: [id number] PCN: [id number] Member ID: [id number] Group ID: [id number] Dates of Eligibility: 01/17/2024 through 01/15/2025  Fund:  Chronic Lymphocytic Leukemia  Shakthi Scipio (Patty) Chet Burnet, CPhT  [facility], Zelda Salmon, [facility] - Oral Chemotherapy Patient Advocate Specialist III Phone: [phone]  Fax: [phone]

## 2024-02-19 ENCOUNTER — Ambulatory Visit: Admitting: Oncology

## 2024-02-19 ENCOUNTER — Other Ambulatory Visit

## 2024-02-21 ENCOUNTER — Other Ambulatory Visit: Payer: Self-pay

## 2024-02-21 NOTE — Progress Notes (Signed)
 Specialty Pharmacy Refill Coordination Note  Douglas [name] is a 73 y.o. male contacted today regarding refills of specialty medication(s) Acalabrutinib  Maleate (Calquence )   Patient requested Delivery   Delivery date: 02/22/24   Verified address: [address] [place]  [place] [address]   Medication will be filled on: 02/21/24

## 2024-03-13 ENCOUNTER — Inpatient Hospital Stay: Attending: Oncology

## 2024-03-13 ENCOUNTER — Ambulatory Visit: Payer: Self-pay | Admitting: Oncology

## 2024-03-13 DIAGNOSIS — C9111 Chronic lymphocytic leukemia of B-cell type in remission: Secondary | ICD-10-CM | POA: Insufficient documentation

## 2024-03-13 DIAGNOSIS — D75839 Thrombocytosis, unspecified: Secondary | ICD-10-CM

## 2024-03-13 LAB — CBC WITH DIFFERENTIAL (CANCER CENTER ONLY)
Abs Immature Granulocytes: 0.09 K/uL — ABNORMAL HIGH (ref 0.00–0.07)
Basophils Absolute: 0.2 K/uL — ABNORMAL HIGH (ref 0.0–0.1)
Basophils Relative: 2 %
Eosinophils Absolute: 0.1 K/uL (ref 0.0–0.5)
Eosinophils Relative: 1 %
HCT: 44 % (ref 39.0–52.0)
Hemoglobin: 15.3 g/dL (ref 13.0–17.0)
Immature Granulocytes: 1 %
Lymphocytes Relative: 16 %
Lymphs Abs: 1.2 K/uL (ref 0.7–4.0)
MCH: 38.3 pg — ABNORMAL HIGH (ref 26.0–34.0)
MCHC: 34.8 g/dL (ref 30.0–36.0)
MCV: 110 fL — ABNORMAL HIGH (ref 80.0–100.0)
Monocytes Absolute: 0.4 K/uL (ref 0.1–1.0)
Monocytes Relative: 5 %
Neutro Abs: 5.9 K/uL (ref 1.7–7.7)
Neutrophils Relative %: 75 %
Platelet Count: 664 K/uL — ABNORMAL HIGH (ref 150–400)
RBC: 4 MIL/uL — ABNORMAL LOW (ref 4.22–5.81)
RDW: 16.3 % — ABNORMAL HIGH (ref 11.5–15.5)
WBC Count: 7.8 K/uL (ref 4.0–10.5)
nRBC: 0 % (ref 0.0–0.2)

## 2024-03-14 NOTE — Telephone Encounter (Signed)
-----   Message from Arley Hof sent at 03/13/2024  3:39 PM EST ----- Please call patient, the platelet count is slightly lower, continue hydroxyurea  at the current dose, follow-up as scheduled  ----- Message ----- From: Rebecka, Lab In [place] Sent: 03/13/2024  10:28 AM EST To: Arley KATHEE Hof, MD

## 2024-03-14 NOTE — Telephone Encounter (Signed)
 Patient gave verbal understanding and had no further questions.

## 2024-03-15 ENCOUNTER — Other Ambulatory Visit: Payer: Self-pay

## 2024-03-15 ENCOUNTER — Other Ambulatory Visit: Payer: Self-pay | Admitting: Oncology

## 2024-03-15 DIAGNOSIS — C9110 Chronic lymphocytic leukemia of B-cell type not having achieved remission: Secondary | ICD-10-CM

## 2024-03-18 ENCOUNTER — Other Ambulatory Visit (HOSPITAL_COMMUNITY): Payer: Self-pay

## 2024-03-18 ENCOUNTER — Other Ambulatory Visit: Payer: Self-pay

## 2024-03-18 ENCOUNTER — Other Ambulatory Visit: Payer: Self-pay | Admitting: Oncology

## 2024-03-18 DIAGNOSIS — C9110 Chronic lymphocytic leukemia of B-cell type not having achieved remission: Secondary | ICD-10-CM

## 2024-03-18 MED ORDER — CALQUENCE 100 MG PO TABS
100.0000 mg | ORAL_TABLET | Freq: Two times a day (BID) | ORAL | 2 refills | Status: AC
  Filled 2024-03-18: qty 60, 30d supply, fill #0
  Filled 2024-04-10: qty 60, 30d supply, fill #1
  Filled 2024-05-15 – 2024-05-16 (×2): qty 60, 30d supply, fill #2

## 2024-03-20 ENCOUNTER — Other Ambulatory Visit: Payer: Self-pay | Admitting: Pharmacy Technician

## 2024-03-20 ENCOUNTER — Other Ambulatory Visit: Payer: Self-pay

## 2024-03-20 NOTE — Progress Notes (Signed)
 Specialty Pharmacy Refill Coordination Note  Douglas [name] is a 73 y.o. male contacted today regarding refills of specialty medication(s) Acalabrutinib  Maleate (Calquence )  Spoke with Wife  Patient requested Delivery   Delivery date: 03/25/24   Verified address: [address]  [place]    Medication will be filled on: 03/22/24

## 2024-03-22 ENCOUNTER — Other Ambulatory Visit: Payer: Self-pay

## 2024-03-28 ENCOUNTER — Other Ambulatory Visit (HOSPITAL_BASED_OUTPATIENT_CLINIC_OR_DEPARTMENT_OTHER): Payer: Self-pay

## 2024-03-29 ENCOUNTER — Other Ambulatory Visit (HOSPITAL_BASED_OUTPATIENT_CLINIC_OR_DEPARTMENT_OTHER): Payer: Self-pay

## 2024-04-04 ENCOUNTER — Emergency Department (HOSPITAL_COMMUNITY)

## 2024-04-04 ENCOUNTER — Inpatient Hospital Stay (HOSPITAL_COMMUNITY)
Admission: EM | Admit: 2024-04-04 | Discharge: 2024-04-07 | DRG: 291 | Disposition: A | Discharge status: Home / Self Care | Attending: Internal Medicine | Admitting: Internal Medicine

## 2024-04-04 ENCOUNTER — Other Ambulatory Visit: Payer: Self-pay

## 2024-04-04 ENCOUNTER — Encounter (HOSPITAL_COMMUNITY): Payer: Self-pay | Admitting: Internal Medicine

## 2024-04-04 DIAGNOSIS — R110 Nausea: Secondary | ICD-10-CM | POA: Diagnosis present

## 2024-04-04 DIAGNOSIS — J9811 Atelectasis: Secondary | ICD-10-CM | POA: Diagnosis present

## 2024-04-04 DIAGNOSIS — E7849 Other hyperlipidemia: Secondary | ICD-10-CM | POA: Diagnosis present

## 2024-04-04 DIAGNOSIS — I739 Peripheral vascular disease, unspecified: Secondary | ICD-10-CM | POA: Diagnosis not present

## 2024-04-04 DIAGNOSIS — I110 Hypertensive heart disease with heart failure: Principal | ICD-10-CM | POA: Diagnosis present

## 2024-04-04 DIAGNOSIS — I509 Heart failure, unspecified: Principal | ICD-10-CM

## 2024-04-04 DIAGNOSIS — I4510 Unspecified right bundle-branch block: Secondary | ICD-10-CM | POA: Diagnosis present

## 2024-04-04 DIAGNOSIS — E1169 Type 2 diabetes mellitus with other specified complication: Secondary | ICD-10-CM | POA: Diagnosis present

## 2024-04-04 DIAGNOSIS — Z7982 Long term (current) use of aspirin: Secondary | ICD-10-CM | POA: Diagnosis not present

## 2024-04-04 DIAGNOSIS — Z89611 Acquired absence of right leg above knee: Secondary | ICD-10-CM

## 2024-04-04 DIAGNOSIS — Z89511 Acquired absence of right leg below knee: Secondary | ICD-10-CM

## 2024-04-04 DIAGNOSIS — F32A Depression, unspecified: Secondary | ICD-10-CM | POA: Diagnosis present

## 2024-04-04 DIAGNOSIS — I10 Essential (primary) hypertension: Secondary | ICD-10-CM | POA: Diagnosis present

## 2024-04-04 DIAGNOSIS — N400 Enlarged prostate without lower urinary tract symptoms: Secondary | ICD-10-CM | POA: Diagnosis present

## 2024-04-04 DIAGNOSIS — I700 Atherosclerosis of aorta: Secondary | ICD-10-CM | POA: Diagnosis not present

## 2024-04-04 DIAGNOSIS — I77810 Thoracic aortic ectasia: Secondary | ICD-10-CM | POA: Diagnosis present

## 2024-04-04 DIAGNOSIS — Z801 Family history of malignant neoplasm of trachea, bronchus and lung: Secondary | ICD-10-CM

## 2024-04-04 DIAGNOSIS — E440 Moderate protein-calorie malnutrition: Secondary | ICD-10-CM | POA: Diagnosis present

## 2024-04-04 DIAGNOSIS — E1151 Type 2 diabetes mellitus with diabetic peripheral angiopathy without gangrene: Secondary | ICD-10-CM | POA: Diagnosis present

## 2024-04-04 DIAGNOSIS — Z1152 Encounter for screening for COVID-19: Secondary | ICD-10-CM | POA: Diagnosis not present

## 2024-04-04 DIAGNOSIS — Z8249 Family history of ischemic heart disease and other diseases of the circulatory system: Secondary | ICD-10-CM

## 2024-04-04 DIAGNOSIS — I2510 Atherosclerotic heart disease of native coronary artery without angina pectoris: Secondary | ICD-10-CM | POA: Diagnosis present

## 2024-04-04 DIAGNOSIS — E785 Hyperlipidemia, unspecified: Secondary | ICD-10-CM | POA: Diagnosis not present

## 2024-04-04 DIAGNOSIS — Z7984 Long term (current) use of oral hypoglycemic drugs: Secondary | ICD-10-CM | POA: Diagnosis not present

## 2024-04-04 DIAGNOSIS — I2602 Saddle embolus of pulmonary artery with acute cor pulmonale: Secondary | ICD-10-CM

## 2024-04-04 DIAGNOSIS — I471 Supraventricular tachycardia, unspecified: Secondary | ICD-10-CM | POA: Diagnosis present

## 2024-04-04 DIAGNOSIS — E66811 Obesity, class 1: Secondary | ICD-10-CM | POA: Diagnosis present

## 2024-04-04 DIAGNOSIS — C9110 Chronic lymphocytic leukemia of B-cell type not having achieved remission: Secondary | ICD-10-CM | POA: Diagnosis present

## 2024-04-04 DIAGNOSIS — E871 Hypo-osmolality and hyponatremia: Secondary | ICD-10-CM | POA: Diagnosis not present

## 2024-04-04 DIAGNOSIS — Z950 Presence of cardiac pacemaker: Secondary | ICD-10-CM | POA: Diagnosis not present

## 2024-04-04 DIAGNOSIS — I5023 Acute on chronic systolic (congestive) heart failure: Secondary | ICD-10-CM | POA: Diagnosis present

## 2024-04-04 DIAGNOSIS — I5021 Acute systolic (congestive) heart failure: Secondary | ICD-10-CM | POA: Diagnosis not present

## 2024-04-04 DIAGNOSIS — R0602 Shortness of breath: Secondary | ICD-10-CM

## 2024-04-04 DIAGNOSIS — E119 Type 2 diabetes mellitus without complications: Secondary | ICD-10-CM

## 2024-04-04 DIAGNOSIS — I428 Other cardiomyopathies: Secondary | ICD-10-CM | POA: Diagnosis present

## 2024-04-04 DIAGNOSIS — F17210 Nicotine dependence, cigarettes, uncomplicated: Secondary | ICD-10-CM | POA: Diagnosis present

## 2024-04-04 DIAGNOSIS — N401 Enlarged prostate with lower urinary tract symptoms: Secondary | ICD-10-CM | POA: Diagnosis not present

## 2024-04-04 DIAGNOSIS — Z86711 Personal history of pulmonary embolism: Secondary | ICD-10-CM

## 2024-04-04 DIAGNOSIS — D75839 Thrombocytosis, unspecified: Secondary | ICD-10-CM | POA: Diagnosis present

## 2024-04-04 DIAGNOSIS — R109 Unspecified abdominal pain: Secondary | ICD-10-CM | POA: Diagnosis present

## 2024-04-04 DIAGNOSIS — Z7902 Long term (current) use of antithrombotics/antiplatelets: Secondary | ICD-10-CM

## 2024-04-04 DIAGNOSIS — Z86718 Personal history of other venous thrombosis and embolism: Secondary | ICD-10-CM

## 2024-04-04 DIAGNOSIS — Z79899 Other long term (current) drug therapy: Secondary | ICD-10-CM

## 2024-04-04 DIAGNOSIS — Z6831 Body mass index (BMI) 31.0-31.9, adult: Secondary | ICD-10-CM

## 2024-04-04 DIAGNOSIS — I493 Ventricular premature depolarization: Secondary | ICD-10-CM | POA: Diagnosis present

## 2024-04-04 DIAGNOSIS — I4430 Unspecified atrioventricular block: Secondary | ICD-10-CM | POA: Diagnosis present

## 2024-04-04 DIAGNOSIS — Z95828 Presence of other vascular implants and grafts: Secondary | ICD-10-CM

## 2024-04-04 LAB — BASIC METABOLIC PANEL WITH GFR
Anion gap: 15 (ref 5–15)
BUN: 17 mg/dL (ref 8–23)
CO2: 23 mmol/L (ref 22–32)
Calcium: 9.4 mg/dL (ref 8.9–10.3)
Chloride: 92 mmol/L — ABNORMAL LOW (ref 98–111)
Creatinine, Ser: 0.71 mg/dL (ref 0.61–1.24)
GFR, Estimated: >60 mL/min
Glucose, Bld: 259 mg/dL — ABNORMAL HIGH (ref 70–99)
Potassium: 4.2 mmol/L (ref 3.5–5.1)
Sodium: 130 mmol/L — ABNORMAL LOW (ref 135–145)

## 2024-04-04 LAB — HEPATIC FUNCTION PANEL
ALT: 21 U/L (ref 0–44)
AST: 23 U/L (ref 15–41)
Albumin: 4.2 g/dL (ref 3.5–5.0)
Alkaline Phosphatase: 65 U/L (ref 38–126)
Bilirubin, Direct: 0.2 mg/dL (ref 0.0–0.2)
Indirect Bilirubin: 0.2 mg/dL — ABNORMAL LOW (ref 0.3–0.9)
Total Bilirubin: 0.4 mg/dL (ref 0.0–1.2)
Total Protein: 6.4 g/dL — ABNORMAL LOW (ref 6.5–8.1)

## 2024-04-04 LAB — GLUCOSE, CAPILLARY
Glucose-Capillary: 160 mg/dL — ABNORMAL HIGH (ref 70–99)
Glucose-Capillary: 147 mg/dL — ABNORMAL HIGH (ref 70–99)

## 2024-04-04 LAB — PROTIME-INR
INR: 1 (ref 0.8–1.2)
Prothrombin Time: 13.6 s (ref 11.4–15.2)

## 2024-04-04 LAB — CBC
HCT: 43.2 % (ref 39.0–52.0)
Hemoglobin: 15.3 g/dL (ref 13.0–17.0)
MCH: 39.6 pg — ABNORMAL HIGH (ref 26.0–34.0)
MCHC: 35.4 g/dL (ref 30.0–36.0)
MCV: 111.9 fL — ABNORMAL HIGH (ref 80.0–100.0)
Platelets: 658 K/uL — ABNORMAL HIGH (ref 150–400)
RBC: 3.86 MIL/uL — ABNORMAL LOW (ref 4.22–5.81)
RDW: 17.3 % — ABNORMAL HIGH (ref 11.5–15.5)
WBC: 8.6 K/uL (ref 4.0–10.5)
nRBC: 0 % (ref 0.0–0.2)

## 2024-04-04 LAB — HEMOGLOBIN A1C
Hgb A1c MFr Bld: 6.9 % — ABNORMAL HIGH (ref 4.8–5.6)
Mean Plasma Glucose: 151.33 mg/dL

## 2024-04-04 LAB — RESP PANEL BY RT-PCR (RSV, FLU A&B, COVID)  RVPGX2
Influenza A by PCR: NEGATIVE
Influenza B by PCR: NEGATIVE
Resp Syncytial Virus by PCR: NEGATIVE
SARS Coronavirus 2 by RT PCR: NEGATIVE

## 2024-04-04 LAB — TROPONIN T, HIGH SENSITIVITY
Troponin T High Sensitivity: 39 ng/L — ABNORMAL HIGH (ref 0–19)
Troponin T High Sensitivity: 50 ng/L — ABNORMAL HIGH (ref 0–19)

## 2024-04-04 LAB — PRO BRAIN NATRIURETIC PEPTIDE: Pro Brain Natriuretic Peptide: 1422 pg/mL — ABNORMAL HIGH

## 2024-04-04 LAB — MAGNESIUM: Magnesium: 1.5 mg/dL — ABNORMAL LOW (ref 1.7–2.4)

## 2024-04-04 MED ORDER — ACETAMINOPHEN 325 MG PO TABS
650.0000 mg | ORAL_TABLET | Freq: Four times a day (QID) | ORAL | Status: DC | PRN
  Administered 2024-04-05 – 2024-04-07 (×5): 650 mg via ORAL
  Filled 2024-04-04 (×5): qty 2

## 2024-04-04 MED ORDER — CLOPIDOGREL BISULFATE 75 MG PO TABS
75.0000 mg | ORAL_TABLET | Freq: Every day | ORAL | Status: DC
  Administered 2024-04-05 – 2024-04-07 (×3): 75 mg via ORAL
  Filled 2024-04-04 (×3): qty 1

## 2024-04-04 MED ORDER — TRAZODONE HCL 50 MG PO TABS: 25.0000 mg | ORAL_TABLET | Freq: Every evening | ORAL | Status: DC | PRN

## 2024-04-04 MED ORDER — HYDROXYUREA 500 MG PO CAPS
1500.0000 mg | ORAL_CAPSULE | Freq: Every day | ORAL | Status: DC
  Administered 2024-04-05 – 2024-04-07 (×3): 1500 mg via ORAL
  Filled 2024-04-04 (×3): qty 3

## 2024-04-04 MED ORDER — INSULIN ASPART 100 UNIT/ML IJ SOLN: 0.0000 [IU] | Freq: Every day | INTRAMUSCULAR | Status: DC

## 2024-04-04 MED ORDER — HYDRALAZINE HCL 20 MG/ML IJ SOLN: 5.0000 mg | INTRAMUSCULAR | Status: DC | PRN

## 2024-04-04 MED ORDER — ASPIRIN 81 MG PO TBEC
81.0000 mg | DELAYED_RELEASE_TABLET | Freq: Every day | ORAL | Status: DC
  Administered 2024-04-05 – 2024-04-07 (×3): 81 mg via ORAL
  Filled 2024-04-04 (×3): qty 1

## 2024-04-04 MED ORDER — HEPARIN SODIUM (PORCINE) 5000 UNIT/ML IJ SOLN
5000.0000 [IU] | Freq: Three times a day (TID) | INTRAMUSCULAR | Status: DC
  Administered 2024-04-04 – 2024-04-07 (×8): 5000 [IU] via SUBCUTANEOUS
  Filled 2024-04-04 (×8): qty 1

## 2024-04-04 MED ORDER — TRAZODONE HCL 50 MG PO TABS
50.0000 mg | ORAL_TABLET | Freq: Every day | ORAL | Status: DC
  Administered 2024-04-04 – 2024-04-06 (×3): 50 mg via ORAL
  Filled 2024-04-04 (×3): qty 1

## 2024-04-04 MED ORDER — GABAPENTIN 300 MG PO CAPS
300.0000 mg | ORAL_CAPSULE | Freq: Two times a day (BID) | ORAL | Status: DC
  Administered 2024-04-04 – 2024-04-07 (×6): 300 mg via ORAL
  Filled 2024-04-04 (×6): qty 1

## 2024-04-04 MED ORDER — MAGNESIUM SULFATE 2 GM/50ML IV SOLN: 2.0000 g | Freq: Once | INTRAVENOUS | Status: DC

## 2024-04-04 MED ORDER — ACETAMINOPHEN 650 MG RE SUPP: 650.0000 mg | Freq: Four times a day (QID) | RECTAL | Status: DC | PRN

## 2024-04-04 MED ORDER — FINASTERIDE 5 MG PO TABS
5.0000 mg | ORAL_TABLET | Freq: Every day | ORAL | Status: DC
  Administered 2024-04-05 – 2024-04-07 (×3): 5 mg via ORAL
  Filled 2024-04-04 (×3): qty 1

## 2024-04-04 MED ORDER — INSULIN ASPART 100 UNIT/ML IJ SOLN
0.0000 [IU] | Freq: Three times a day (TID) | INTRAMUSCULAR | Status: DC
  Administered 2024-04-04: 3 [IU] via SUBCUTANEOUS
  Administered 2024-04-05: 2 [IU] via SUBCUTANEOUS
  Administered 2024-04-05: 3 [IU] via SUBCUTANEOUS
  Administered 2024-04-05: 5 [IU] via SUBCUTANEOUS
  Administered 2024-04-06 (×3): 3 [IU] via SUBCUTANEOUS
  Administered 2024-04-07: 2 [IU] via SUBCUTANEOUS
  Administered 2024-04-07: 3 [IU] via SUBCUTANEOUS
  Filled 2024-04-04: qty 5
  Filled 2024-04-04 (×2): qty 1
  Filled 2024-04-04 (×2): qty 3
  Filled 2024-04-04: qty 1
  Filled 2024-04-04: qty 2
  Filled 2024-04-04 (×2): qty 1

## 2024-04-04 MED ORDER — ACALABRUTINIB MALEATE 100 MG PO TABS: 100.0000 mg | ORAL_TABLET | Freq: Two times a day (BID) | ORAL | Status: DC

## 2024-04-04 MED ORDER — SODIUM CHLORIDE 0.9% FLUSH
3.0000 mL | Freq: Two times a day (BID) | INTRAVENOUS | Status: DC
  Administered 2024-04-04 – 2024-04-07 (×6): 3 mL via INTRAVENOUS

## 2024-04-04 MED ORDER — ONDANSETRON HCL 4 MG PO TABS: 4.0000 mg | ORAL_TABLET | Freq: Four times a day (QID) | ORAL | Status: DC | PRN

## 2024-04-04 MED ORDER — FUROSEMIDE 10 MG/ML IJ SOLN
40.0000 mg | Freq: Two times a day (BID) | INTRAMUSCULAR | Status: DC
  Administered 2024-04-04: 40 mg via INTRAVENOUS
  Filled 2024-04-04 (×2): qty 4

## 2024-04-04 MED ORDER — MAGNESIUM SULFATE 50 % IJ SOLN
3.0000 g | Freq: Once | INTRAVENOUS | Status: AC
  Administered 2024-04-04: 3 g via INTRAVENOUS
  Filled 2024-04-04: qty 6

## 2024-04-04 MED ORDER — ONDANSETRON HCL 4 MG/2ML IJ SOLN
4.0000 mg | Freq: Four times a day (QID) | INTRAMUSCULAR | Status: DC | PRN
  Administered 2024-04-05 – 2024-04-06 (×3): 4 mg via INTRAVENOUS
  Filled 2024-04-04 (×3): qty 2

## 2024-04-04 MED ORDER — FUROSEMIDE 10 MG/ML IJ SOLN
40.0000 mg | Freq: Once | INTRAMUSCULAR | Status: AC
  Administered 2024-04-04: 40 mg via INTRAVENOUS
  Filled 2024-04-04: qty 4

## 2024-04-04 MED ORDER — POLYETHYLENE GLYCOL 3350 17 G PO PACK: 17.0000 g | PACK | Freq: Every day | ORAL | Status: DC | PRN

## 2024-04-04 MED ORDER — ALBUTEROL SULFATE (2.5 MG/3ML) 0.083% IN NEBU: 2.5000 mg | INHALATION_SOLUTION | Freq: Four times a day (QID) | RESPIRATORY_TRACT | Status: DC | PRN

## 2024-04-04 MED ORDER — IOHEXOL 350 MG/ML SOLN
75.0000 mL | Freq: Once | INTRAVENOUS | Status: AC | PRN
  Administered 2024-04-04: 75 mL via INTRAVENOUS

## 2024-04-04 NOTE — Assessment & Plan Note (Addendum)
 Patient was placed on insulin  sliding scale for glucose cover and monitoring.   His glucose remained stable.   Continue with statin

## 2024-04-04 NOTE — TOC CM/SW Note (Signed)
 TOC consult received for d/c planning needs. Follow-up to be completed with patient as appropriate.   Merilee Batty, MSN, RN Case Management [phone]

## 2024-04-04 NOTE — ED Provider Notes (Signed)
 " [place] EM[facility]GENCY DEPARTMENT AT [facility] Provider Note   CSN: [id number] Arrival date & time: 04/04/24  1056     Patient presents with: Shortness of Breath   Riad Wagley is a 73 y.o. male.    Shortness of Breath Patient has a history of CLL, hypertension, DM 2, PAD, SVT, DVT and PE with VTE filter.   Pt complains of shortness of breath x 30 days accompanied with some sinus congestion.  Patient states he has had progressively worsening dyspnea with exertion and then developed orthopnea and is now short of breath with exertion.    Hx CLL currently getting treatment, thrombocytosis and PE. Taking plavix  with no missed doses.    Noticed swelling in bilateral upper extremity x 3 month worse on R.    Denies fever, headaches, vision changes, abdominal pain, n/v/d, dysuria.      Prior to Admission medications  Medication Sig Start Date End Date Taking? Authorizing Provider  acalabrutinib  maleate (CALQUENCE ) 100 MG tablet Take 1 tablet (100 mg) by mouth 2 (two) times daily. 03/18/24   Cloretta Arley NOVAK, MD  acetaminophen  (TYLENOL ) 500 MG tablet Take 500 mg by mouth at bedtime.    [provider]  albuterol  (PROVENTIL ) (2.5 MG/3ML) 0.083% nebulizer solution Take 2.5 mg by nebulization every 6 (six) hours as needed for wheezing or shortness of breath. Patient not taking: Reported on 02/12/2024    [provider]  albuterol  (VENTOLIN  HFA) 108 (90 Base) MCG/ACT inhaler Inhale 2 puffs into the lungs every 6 (six) hours as needed.    [provider]  amLODipine  (NORVASC ) 10 MG tablet Take 10 mg by mouth daily. 05/10/22   [provider]  aspirin  EC 81 MG tablet Take 81 mg by mouth daily. Swallow whole.    [provider]  bisacodyl  (DULCOLAX) 5 MG EC tablet Take 5 mg by mouth daily.    [provider]  clopidogrel  (PLAVIX ) 75 MG tablet Take 75 mg by mouth daily. 03/15/22   [provider]  docusate sodium   (COLACE) 100 MG capsule Take 1 capsule (100 mg total) by mouth 2 (two) times daily. Patient not taking: Reported on 02/12/2024 07/15/21   Angiulli, Toribio PARAS, PA-C  finasteride  (PROSCAR ) 5 MG tablet Take 1 tablet (5 mg total) by mouth daily. 07/15/21   Angiulli, Toribio PARAS, PA-C  gabapentin  (NEURONTIN ) 300 MG capsule Take 300 mg by mouth 2 (two) times daily. 01/31/23   [provider]  hydroxyurea  (HYDREA ) 500 MG capsule Take 3 capsules (1,500 mg total) by mouth daily. 02/12/24   Cloretta Arley NOVAK, MD  melatonin 5 MG TABS Take 1 tablet (5 mg total) by mouth at bedtime. 07/15/21   Angiulli, Toribio PARAS, PA-C  metFORMIN (GLUCOPHAGE) 500 MG tablet Take 500 mg by mouth 2 (two) times daily.    [provider]  nystatin  cream (MYCOSTATIN ) Apply 1 application Externally Twice a day Patient not taking: Reported on 02/12/2024 12/13/23     Oxymetazoline  HCl (MUCINEX  NASAL SPRAY FULL FORCE NA) Place 1 spray into the nose 2 (two) times daily as needed (congestion).    [provider]  traZODone  (DESYREL ) 50 MG tablet Take 50 mg by mouth at bedtime. 05/27/22   [provider]  valsartan -hydrochlorothiazide  (DIOVAN -HCT) 320-25 MG tablet Take 1 tablet by mouth daily. 05/04/22   [provider]    Allergies: Patient has no known allergies.    Review of Systems  Respiratory:  Positive for shortness of breath.  Updated Vital Signs BP (!) 161/78   Pulse (!) 110   Temp 98 F (36.7 C)   Resp 20   Ht 6' (1.829 m)   Wt 99.8 kg   SpO2 97%   BMI 29.84 kg/m   Physical Exam Vitals and nursing note reviewed.  Constitutional:      General: He is not in acute distress. HENT:     Head: Normocephalic and atraumatic.     Nose: Nose normal.  Eyes:     General: No scleral icterus. Cardiovascular:     Rate and Rhythm: Normal rate and regular rhythm.     Pulses: Normal pulses.     Heart sounds: Normal heart sounds.  Pulmonary:     Effort: No respiratory distress.     Breath  sounds: Rales present. No wheezing.     Comments: Tachypneic Abdominal:     Palpations: Abdomen is soft.     Tenderness: There is no abdominal tenderness.  Musculoskeletal:     Cervical back: Normal range of motion.     Left lower leg: Edema present.     Comments: Right lower extremity surgically absent  Skin:    General: Skin is warm and dry.     Capillary Refill: Capillary refill takes less than 2 seconds.  Neurological:     Mental Status: He is alert. Mental status is at baseline.  Psychiatric:        Mood and Affect: Mood normal.        Behavior: Behavior normal.     (all labs ordered are listed, but only abnormal results are displayed) Labs Reviewed  BASIC METABOLIC PANEL WITH GFR - Abnormal; Notable for the following components:      Result Value   Sodium 130 (*)    Chloride 92 (*)    Glucose, Bld 259 (*)    All other components within normal limits  CBC - Abnormal; Notable for the following components:   RBC 3.86 (*)    MCV 111.9 (*)    MCH 39.6 (*)    RDW 17.3 (*)    Platelets 658 (*)    All other components within normal limits  HEPATIC FUNCTION PANEL - Abnormal; Notable for the following components:   Total Protein 6.4 (*)    Indirect Bilirubin 0.2 (*)    All other components within normal limits  PRO BRAIN NATRIURETIC PEPTIDE - Abnormal; Notable for the following components:   Pro Brain Natriuretic Peptide 1,422.0 (*)    All other components within normal limits  TROPONIN T, HIGH SENSITIVITY - Abnormal; Notable for the following components:   Troponin T High Sensitivity 39 (*)    All other components within normal limits  RESP PANEL BY RT-PCR (RSV, FLU A&B, COVID)  RVPGX2  PROTIME-INR  HEMOGLOBIN A1C    EKG: None  Radiology: CT Angio Chest PE W and/or Wo Contrast Result Date: 04/04/2024 EXAM: CTA CHEST 04/04/2024 01:24:00 PM TECHNIQUE: CTA of the chest was performed without and with the administration of 75 mL of iohexol  (OMNIPAQUE ) 350 MG/ML  injection. Multiplanar reformatted images are provided for review. MIP images are provided for review. Automated exposure control, iterative reconstruction, and/or weight based adjustment of the mA/kV was utilized to reduce the radiation dose to as low as reasonably achievable. COMPARISON: Chest x-ray 04/04/2024. CLINICAL HISTORY: Pulmonary embolism (PE) suspected, high prob. FINDINGS: PULMONARY ARTERIES: Pulmonary arteries are adequately opacified for evaluation. No acute pulmonary embolus. Main pulmonary artery is normal in caliber. MEDIASTINUM: The heart demonstrates  4-vessel coronary artery calcification and mild interlateral wall thickening. Aortic root enlargement measuring up to 4.2 cm. The ascending thoracic aorta is otherwise normal in caliber. The descending thoracic aorta is normal in caliber. Severe atherosclerotic plaque of the thoracic aorta. The pericardium demonstrates no acute abnormality. LYMPH NODES: No mediastinal, hilar or axillary lymphadenopathy. LUNGS AND PLEURA: Bilateral lower lobe passive atelectasis. Diffuse bronchial wall thickening. Expiratory phase respiration. Small left and moderate right pleural effusions. No pneumothorax. UPPER ABDOMEN: Calcified gallstones within the gallbladder lumen. Gallbladder partially visualized. SOFT TISSUES AND BONES: Severe deformity of the lateral glenohumeral joints. Left chest wall dual-lead pacemaker. No acute soft tissue abnormality. IMPRESSION: 1. No pulmonary embolus. 2. Mild pulmonary edema with associated small left and moderate right pleural effusions with bilateral lower lobe passive atelectasis. 3. Aortic root enlargement measuring up to 4.2 cm, with the remainder of the thoracic aorta normal in caliber and severe atherosclerotic plaque. Electronically signed by: Morgane Naveau MD 04/04/2024 01:33 PM EST RP Workstation: [id number]   DG Chest 2 View Result Date: 04/04/2024 CLINICAL DATA:  Shortness of breath. EXAM: CHEST - 2 VIEW  COMPARISON:  08/26/2021 FINDINGS: The cardio pericardial silhouette is enlarged. Bibasilar atelectasis/infiltrate noted. Tiny bilateral pleural effusions suspected. Dual lead left-sided permanent pacemaker again noted. No acute bony abnormality. Degenerative changes evident in both shoulders. IMPRESSION: Bibasilar atelectasis/infiltrate with tiny bilateral pleural effusions. Electronically Signed   By: Camellia Candle M.D.   On: 04/04/2024 11:59     .Critical Care  Performed by: Neldon Hamp RAMAN, PA Authorized by: Neldon Hamp RAMAN, PA   Critical care provider statement:    Critical care time (minutes):  35   Critical care time was exclusive of:  Separately billable procedures and treating other patients and teaching time   Critical care was necessary to treat or prevent imminent or life-threatening deterioration of the following conditions:  Respiratory failure   Critical care was time spent personally by me on the following activities:  Development of treatment plan with patient or surrogate, review of old charts, re-evaluation of patient's condition, pulse oximetry, ordering and review of radiographic studies, ordering and review of laboratory studies, ordering and performing treatments and interventions, obtaining history from patient or surrogate, examination of patient and evaluation of patient's response to treatment   Care discussed with: admitting provider      Medications Ordered in the ED  aspirin  EC tablet 81 mg (has no administration in time range)  hydroxyurea  (HYDREA ) capsule 1,500 mg (has no administration in time range)  acalabrutinib  maleate (CALQUENCE ) tablet 100 mg (has no administration in time range)  traZODone  (DESYREL ) tablet 50 mg (has no administration in time range)  finasteride  (PROSCAR ) tablet 5 mg (has no administration in time range)  clopidogrel  (PLAVIX ) tablet 75 mg (has no administration in time range)  gabapentin  (NEURONTIN ) capsule 300 mg (has no administration  in time range)  albuterol  (PROVENTIL ) (2.5 MG/3ML) 0.083% nebulizer solution 2.5 mg (has no administration in time range)  furosemide  (LASIX ) injection 40 mg (has no administration in time range)  insulin  aspart (novoLOG ) injection 0-15 Units (has no administration in time range)  insulin  aspart (novoLOG ) injection 0-5 Units (has no administration in time range)  sodium chloride  flush (NS) 0.9 % injection 3 mL (has no administration in time range)  acetaminophen  (TYLENOL ) tablet 650 mg (has no administration in time range)    Or  acetaminophen  (TYLENOL ) suppository 650 mg (has no administration in time range)  traZODone  (DESYREL ) tablet 25 mg (has no administration  in time range)  polyethylene glycol (MIRALAX  / GLYCOLAX ) packet 17 g (has no administration in time range)  ondansetron  (ZOFRAN ) tablet 4 mg (has no administration in time range)    Or  ondansetron  (ZOFRAN ) injection 4 mg (has no administration in time range)  hydrALAZINE  (APRESOLINE ) injection 5 mg (has no administration in time range)  heparin  injection 5,000 Units (has no administration in time range)  iohexol  (OMNIPAQUE ) 350 MG/ML injection 75 mL (75 mLs Intravenous Contrast Given 04/04/24 1323)  furosemide  (LASIX ) injection 40 mg (40 mg Intravenous Given 04/04/24 1356)                                    Medical Decision Making Amount and/or Complexity of Data Reviewed Labs: ordered. Radiology: ordered.  Risk Prescription drug management. Decision regarding hospitalization.   This patient presents to the ED for concern of SOB, this involves a number of treatment options, and is a complaint that carries with it a high risk of complications and morbidity. A differential diagnosis was considered for the patient's symptoms which is discussed below:   The causes for shortness of breath include but are not limited to Cardiac (AHF, pericardial effusion and tamponade, arrhythmias, ischemia, etc) Respiratory (COPD, asthma,  pneumonia, pneumothorax, primary pulmonary hypertension, PE/VQ mismatch) Hematological (anemia) Neuromuscular (ALS, Guillain-Barr, etc)    Co morbidities: Discussed in HPI   Brief History:  Patient has a history of CLL, hypertension, DM 2, PAD, SVT, DVT and PE with VTE filter.   Pt complains of shortness of breath x 30 days accompanied with some sinus congestion.  Patient states he has had progressively worsening dyspnea with exertion and then developed orthopnea and is now short of breath with exertion.    Hx CLL currently getting treatment, thrombocytosis and PE. Taking plavix  with no missed doses.    Noticed swelling in bilateral upper extremity x 3 month worse on R.    Denies fever, headaches, vision changes, abdominal pain, n/v/d, dysuria.     EMR reviewed including pt PMHx, past surgical history and past visits to ER.   See HPI for more details   Lab Tests:   I ordered and independently interpreted labs. Labs notable for Elevated BNP consistent with heart failure, respiratory panel negative, BMP with hyponatremia of 130  Imaging Studies:  Abnormal findings. I personally reviewed all imaging studies. Imaging notable for CT PE study with pleural effusions likely secondary to heart failure   Cardiac Monitoring:  The patient was maintained on a cardiac monitor.  I personally viewed and interpreted the cardiac monitored which showed an underlying rhythm of: Paced rhythm EKG non-ischemic   Medicines ordered:  I ordered medication including Lasix  40 mg for diuresis Reevaluation of the patient after these medicines showed that the patient improved I have reviewed the patients home medicines and have made adjustments as needed   Critical Interventions:     Consults/Attending Physician   I discussed this case with my attending physician who cosigned this note including patient's presenting symptoms, physical exam, and planned diagnostics and interventions.  Attending physician stated agreement with plan or made changes to plan which were implemented.   Reevaluation:  After the interventions noted above I re-evaluated patient and found that they have :improved   Social Determinants of Health:      Problem List / ED Course:  Patient is a 73 year old patient lost to follow-up for heart failure never on  any diuresis seems that he has developed worsening shortness of breath over the past month and has become quite dyspneic.  Crackles on exam and pulmonary edema shown on CT PE study with pleural effusions.  I believe this patient would benefit from diuresis and repeat echocardiogram.  Will admit to the hospitalist service.   Dispostion:  After consideration of the diagnostic results and the patients response to treatment, I feel that the patent would benefit from admission  Final diagnoses:  Acute congestive heart failure, unspecified heart failure type [facility])    ED Discharge Orders     None          Neldon Hamp RAMAN, [place] 04/04/24 1532    Neysa Caron PARAS, DO 04/04/24 1553  "

## 2024-04-04 NOTE — ED Notes (Signed)
 Patient transported to CT

## 2024-04-04 NOTE — Consult Note (Addendum)
 "  Cardiology Consultation   Patient ID: Douglas [name] MRN: [id number]; DOB: [birth year]  Admit date: 04/04/2024 Date of Consult: 04/04/2024  PCP:  Kip Righter, MD   [facility] Providers Cardiologist:  None  Electrophysiologist:  Danelle Birmingham, MD       Patient Profile: Douglas [name] is a 73 y.o. 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 Tobie.  History of Present Illness: [name] was last seen by Dr. Birmingham in 2023 for pacemaker management, lost to clinic follow-up though still getting regular remote device checks (normal function 01/2024). He had several complex vascular admissions in 2023 with complications noted above, with echo 08/07/21 showing EF 45-50% with suspected cor pulmonale with PE on CTA. Admission also notable for widecomplex tachycardia felt secondary to SVT with ventricular pacing felt triggered by acute issues. Repeat limited echo 08/2021 during follow-up admission for hematuria/ABL anemia showed EF 35-40% with global hypokinesis worse in septum and apex, normal RV, mild ascending aortic dilation. Eliquis  was stopped at that time, IVC filter placed recommended to remain in indefinitely. He has not had cardiology follow-up since those admissions. He states he had difficulty navigating the [employer] office physically with the parking situation and wheelchair. He has had any follow-up echo elsewhere in the meantime.  He presented to [facility] with SOB x 1 month worse over the last  week progressing to orthopnea. He has also noted abdominal distension and LLE edema. He has not had any recent CP, palpitations, syncope or infective symptoms. Reports compliance with medications. Initial BP 178/76, sinus tach low 100s. Covid/flu/RSV neg. Labs with Hgb 15.3, plt 658, Na 130 in setting of glucose 259, pBNP1422, hsTroponin 39, A1C 6.9, CXR with bibasilar atelectasis/infiltrate with tiny bilateral pleural effusions. CTA with no PE, + mild pulmonary edema with small L, moderate R pleural effusions with bilateral lower lobe atx, 4.2cm aortic root enlargement, severe atherosclerotic plaque. Echo pending. Started on IV Lasix .  EKG difficult to interpret but V paced rhythm. Telemetry shows atrial sensed V paced rhythm with frequent PVCs. He does not sense these.    Past Medical History:  Diagnosis Date   BPH (benign prostatic hyperplasia)    CLL (chronic lymphocytic leukemia) (HCC)    Hypertension    Pacemaker    Medtronic Device   Pulmonary embolism (HCC)    PVD (peripheral vascular disease)    s/p stent, thrombectomy    Past Surgical History:  Procedure Laterality Date   ABDOMINAL AORTOGRAM W/LOWER EXTREMITY Bilateral 06/21/2021   Procedure: ABDOMINAL AORTOGRAM W/LOWER EXTREMITY;  Surgeon: Sheree Penne Bruckner, MD;  Location: [facility] INVASIVE CV LAB;  Service: Cardiovascular;  Laterality: Bilateral;   ABDOMINAL AORTOGRAM W/LOWER EXTREMITY N/A 08/04/2021   Procedure: ABDOMINAL AORTOGRAM W/LOWER EXTREMITY;  Surgeon: Serene Gaile ORN, MD;  Location: [facility] INVASIVE CV LAB;  Service: Cardiovascular;  Laterality: N/A;   AMPUTATION Right 06/25/2021   Procedure: RIGHT BELOW KNEE AMPUTATION;  Surgeon: Sheree Penne Bruckner, MD;  Location: [facility] OR;  Service: Vascular;  Laterality: Right;   AMPUTATION Right 07/19/2021   Procedure: AMPUTATION ABOVE KNEE;  Surgeon: Eliza Lonni RAMAN, MD;  Location: [facility] OR;  Service: Vascular;  Laterality: Right;   FEMORAL-POPLITEAL BYPASS GRAFT Right  06/22/2021   Procedure: RIGHT FEMORAL-POPLITEAL BYPASS WITH VEIN, RIGHT POPLITEAL THROMBECTOMY;  Surgeon: Sheree Penne Lonni, MD;  Location: [facility] OR;  Service: Vascular;  Laterality: Right;   IVC FILTER INSERTION N/A 08/30/2021   Procedure: IVC FILTER INSERTION;  Surgeon: Sheree Penne Lonni, MD;  Location: [facility] INVASIVE CV LAB;  Service: Cardiovascular;  Laterality: N/A;   LEFT HEART CATH AND CORONARY ANGIOGRAPHY N/A 01/18/2021   Procedure: LEFT HEART CATH AND CORONARY ANGIOGRAPHY;  Surgeon: Verlin Lonni BIRCH, MD;  Location: [facility] INVASIVE CV LAB;  Service: Cardiovascular;  Laterality: N/A;   LOWER EXTREMITY VENOGRAPHY Left 08/06/2021   Procedure: LOWER EXTREMITY VENOGRAPHY;  Surgeon: Magda Debby SAILOR, MD;  Location: [facility] INVASIVE CV LAB;  Service: Cardiovascular;  Laterality: Left;   PACEMAKER IMPLANT N/A 01/19/2021   Procedure: PACEMAKER IMPLANT;  Surgeon: Waddell Danelle ORN, MD;  Location: [facility] INVASIVE CV LAB;  Service: Cardiovascular;  Laterality: N/A;   PERIPHERAL VASCULAR INTERVENTION  08/04/2021   Procedure: PERIPHERAL VASCULAR INTERVENTION;  Surgeon: Serene Gaile ORN, MD;  Location: [facility] INVASIVE CV LAB;  Service: Cardiovascular;;   PERIPHERAL VASCULAR THROMBECTOMY  08/06/2021   Procedure: PERIPHERAL VASCULAR THROMBECTOMY;  Surgeon: Magda Debby SAILOR, MD;  Location: [facility] INVASIVE CV LAB;  Service: Cardiovascular;;  Left Popliteal Vein     Home Medications:  Prior to Admission medications  Medication Sig Start Date End Date Taking? Authorizing Provider  acalabrutinib  maleate (CALQUENCE ) 100 MG tablet Take 1 tablet (100 mg) by mouth 2 (two) times daily. 03/18/24   Cloretta Arley NOVAK, MD  acetaminophen  (TYLENOL ) 500 MG tablet Take 500 mg by mouth at bedtime.    [provider]  albuterol  (PROVENTIL ) (2.5 MG/3ML) 0.083% nebulizer solution Take 2.5 mg by nebulization every 6 (six) hours as needed for wheezing or shortness of breath. Patient not taking: Reported on 02/12/2024    [provider]  albuterol  (VENTOLIN  HFA) 108 (90 Base) MCG/ACT inhaler Inhale 2 puffs into the lungs every 6 (six) hours as needed.    [provider]  amLODipine  (NORVASC ) 10 MG tablet Take 10 mg by mouth daily. 05/10/22   [provider]  aspirin  EC 81 MG tablet Take 81 mg by mouth daily. Swallow whole.    [provider]  bisacodyl  (DULCOLAX) 5 MG EC tablet Take 5 mg by mouth daily.    [provider]  clopidogrel  (PLAVIX ) 75 MG tablet Take 75 mg by mouth daily. 03/15/22   [provider]  docusate sodium  (COLACE) 100 MG capsule Take 1 capsule (100 mg total) by mouth 2 (two) times daily. Patient not taking: Reported on 02/12/2024 07/15/21   Angiulli, Toribio PARAS, PA-C  finasteride  (PROSCAR ) 5 MG tablet Take 1 tablet (5 mg total) by mouth daily. 07/15/21   Angiulli, Toribio PARAS, PA-C  gabapentin  (NEURONTIN ) 300 MG capsule Take 300 mg by mouth 2 (two) times daily. 01/31/23   [provider]  hydroxyurea  (HYDREA ) 500 MG capsule Take 3 capsules (1,500 mg total) by mouth daily. 02/12/24   Cloretta Arley NOVAK, MD  melatonin 5 MG TABS Take 1 tablet (5 mg total) by mouth at bedtime. 07/15/21   Angiulli, Toribio PARAS, PA-C  metFORMIN (GLUCOPHAGE) 500 MG tablet Take 500 mg by mouth 2 (two) times daily.    [provider]  nystatin  cream (MYCOSTATIN ) Apply 1 application Externally Twice a day Patient not taking: Reported on 02/12/2024  12/13/23     Oxymetazoline  HCl (MUCINEX  NASAL SPRAY FULL FORCE NA) Place 1 spray into the nose 2 (two) times daily as needed (congestion).    [provider]  traZODone  (DESYREL ) 50 MG tablet Take 50 mg by mouth at bedtime. 05/27/22   [provider]  valsartan -hydrochlorothiazide  (DIOVAN -HCT) 320-25 MG tablet Take 1 tablet by mouth daily. 05/04/22   [provider]    Scheduled Meds:  acalabrutinib  maleate  100 mg Oral BID   [START ON 04/05/2024] aspirin  EC  81 mg Oral Daily   [START ON 04/05/2024]  clopidogrel   75 mg Oral Daily   [START ON 04/05/2024] finasteride   5 mg Oral Daily   furosemide   40 mg Intravenous Q12H   gabapentin   300 mg Oral BID   heparin   5,000 Units Subcutaneous Q8H   [START ON 04/05/2024] hydroxyurea   1,500 mg Oral Daily   insulin  aspart  0-15 Units Subcutaneous TID WC   insulin  aspart  0-5 Units Subcutaneous QHS   sodium chloride  flush  3 mL Intravenous Q12H   traZODone   50 mg Oral QHS   Continuous Infusions:  PRN Meds: acetaminophen  **OR** acetaminophen , albuterol , hydrALAZINE , ondansetron  **OR** ondansetron  (ZOFRAN ) IV, polyethylene glycol, traZODone   Allergies:   Allergies[1]  Social History:   Social History   Socioeconomic History   Marital status: Married    Spouse name: Not on file   Number of children: Not on file   Years of education: Not on file   Highest education level: Not on file  Occupational History   Occupation: retired  Tobacco Use   Smoking status: Former    Current packs/day: 0.50    Average packs/day: 0.5 packs/day for 10.0 years (5.0 ttl pk-yrs)    Types: Cigarettes    Passive exposure: Never   Smokeless tobacco: Never  Vaping Use   Vaping status: Never Used  Substance and Sexual Activity   Alcohol use: Not Currently   Drug use: Never   Sexual activity: Not on file  Other Topics Concern   Not on file  Social History Narrative   Not on file   Social Drivers of Health   Tobacco Use: Medium Risk (04/04/2024)   Patient History    Smoking Tobacco Use: Former    Smokeless Tobacco Use: Never    Passive Exposure: Never  Physicist, Medical Strain: Not on file  Food Insecurity: No Food Insecurity (04/04/2024)   Epic    Worried About Programme Researcher, Broadcasting/film/video in the Last Year: Never true    Ran Out of Food in the Last Year: Never true  Transportation Needs: No Transportation Needs (04/04/2024)   Epic    Lack of Transportation (Medical): No    Lack of Transportation (Non-Medical): No  Physical Activity: Not on file   Stress: Not on file  Social Connections: Patient Declined (04/04/2024)   Social Connection and Isolation Panel    Frequency of Communication with Friends and Family: Patient declined    Frequency of Social Gatherings with Friends and Family: Patient declined    Attends Religious Services: Patient declined    Active Member of Clubs or Organizations: Patient declined    Attends Banker Meetings: Patient declined    Marital Status: Patient declined  Intimate Partner Violence: Not At Risk (04/04/2024)   Epic    Fear of Current or Ex-Partner: No    Emotionally Abused: No    Physically Abused: No    Sexually Abused: No  Depression (PHQ2-9): Low Risk (02/12/2024)  Depression (PHQ2-9)    PHQ-2 Score: 0  Alcohol Screen: Not on file  Housing: Low Risk (04/04/2024)   Epic    Unable to Pay for Housing in the Last Year: No    Number of Times Moved in the Last Year: 0    Homeless in the Last Year: No  Utilities: Not At Risk (04/04/2024)   Epic    Threatened with loss of utilities: No  Health Literacy: Not on file    Family History:   Family History  Problem Relation Age of Onset   Lung cancer Mother    Coronary artery disease Father      ROS:  Please see the history of present illness.  All other ROS reviewed and negative.     Physical Exam/Data: Vitals:   04/04/24 1427 04/04/24 1530 04/04/24 1600 04/04/24 1621  BP: (!) 161/78 (!) 143/74 133/85 139/80  Pulse: (!) 110 (!) 102 100 (!) 104  Resp: 20 16 16  (P) 18  Temp:   97.7 F (36.5 C) 98.3 F (36.8 C)  TempSrc:   Oral Oral  SpO2: 97% 97% 98% 98%  Weight:    (P) 99.8 kg  Height:    (P) 6' (1.829 m)    Intake/Output Summary (Last 24 hours) at 04/04/2024 1855 Last data filed at 04/04/2024 1700 Gross per 24 hour  Intake --  Output 1000 ml  Net -1000 ml      04/04/2024    4:21 PM 04/04/2024   11:01 AM 02/12/2024   10:41 AM  Last 3 Weights  Weight (lbs) 220 lb 0.3 oz 220 lb 221 lb  Weight (kg) 99.8 kg  99.791 kg 100.245 kg     Body mass index is 29.84 kg/m (pended).  General: Well developed, well nourished, in no acute distress. Head: Normocephalic, atraumatic, sclera non-icteric, no xanthomas, nares are without discharge. Neck: Negative for carotid bruits. JVP not elevated. Lungs: Diminished at bases without wheezes, rales, or rhonchi. Breathing is unlabored. Heart: Regular, mildly tachycardic, occasional PVCs without murmurs, rubs, or gallops.  Abdomen: Soft, non-tender, non-distended with normoactive bowel sounds. No rebound/guarding. Extremities: No clubbing or cyanosis. Trace LLE edema. S/p R AKA Neuro: Alert and oriented X 3. Moves all extremities spontaneously. Psych:  Responds to questions appropriately with a normal affect.   EKG:  The EKG was personally reviewed and demonstrates:  ventricularly paced rhythm, difficult to ascertain P wave activity, 107bpm  Telemetry:  Telemetry was personally reviewed and demonstrates:  atrial sensed V paced frequent PVCs  Relevant CV Studies: Echo 08/2021    1. Since TTE 08/07/21 RV function improved LV function worse with RWMA in  septum and apex.   2. Global hypokinesis worse in the septum and apex . Left ventricular  ejection fraction, by estimation, is 35 to 40%. The left ventricle has  moderately decreased function. The left ventricle has no regional wall  motion abnormalities. The left  ventricular internal cavity size was moderately dilated. There is mild  left ventricular hypertrophy.   3. Right ventricular systolic function is normal. The right ventricular  size is normal.   4. Left atrial size was moderately dilated.   5. The mitral valve is abnormal. No evidence of mitral valve  regurgitation. No evidence of mitral stenosis.   6. The aortic valve is tricuspid. There is mild calcification of the  aortic valve. Aortic valve regurgitation is not visualized. Aortic valve  sclerosis is present, with no evidence of aortic valve  stenosis.   7.  Aortic dilatation noted. There is mild dilatation of the ascending  aorta, measuring 39 mm.   8. The inferior vena cava is normal in size with greater than 50%  respiratory variability, suggesting right atrial pressure of 3 mmHg.    Laboratory Data: High Sensitivity Troponin:  No results for input(s): TROPONINIHS in the last 720 hours.  Recent Labs  Lab 04/04/24 1125  TRNPT 39*      Chemistry Recent Labs  Lab 04/04/24 1104  NA 130*  K 4.2  CL 92*  CO2 23  GLUCOSE 259*  BUN 17  CREATININE 0.71  CALCIUM  9.4  GFRNONAA >60  ANIONGAP 15    Recent Labs  Lab 04/04/24 1125  PROT 6.4*  ALBUMIN  4.2  AST 23  ALT 21  ALKPHOS 65  BILITOT 0.4   Lipids No results for input(s): CHOL, TRIG, HDL, LABVLDL, LDLCALC, CHOLHDL in the last 168 hours.  Hematology Recent Labs  Lab 04/04/24 1104  WBC 8.6  RBC 3.86*  HGB 15.3  HCT 43.2  MCV 111.9*  MCH 39.6*  MCHC 35.4  RDW 17.3*  PLT 658*   Thyroid No results for input(s): TSH, FREET4 in the last 168 hours.  BNP Recent Labs  Lab 04/04/24 1125  PROBNP 1,422.0*    DDimer No results for input(s): DDIMER in the last 168 hours.  Radiology/Studies:  CT Angio Chest PE W and/or Wo Contrast Result Date: 04/04/2024 EXAM: CTA CHEST 04/04/2024 01:24:00 PM TECHNIQUE: CTA of the chest was performed without and with the administration of 75 mL of iohexol  (OMNIPAQUE ) 350 MG/ML injection. Multiplanar reformatted images are provided for review. MIP images are provided for review. Automated exposure control, iterative reconstruction, and/or weight based adjustment of the mA/kV was utilized to reduce the radiation dose to as low as reasonably achievable. COMPARISON: Chest x-ray 04/04/2024. CLINICAL HISTORY: Pulmonary embolism (PE) suspected, high prob. FINDINGS: PULMONARY ARTERIES: Pulmonary arteries are adequately opacified for evaluation. No acute pulmonary embolus. Main pulmonary artery is normal in caliber.  MEDIASTINUM: The heart demonstrates 4-vessel coronary artery calcification and mild interlateral wall thickening. Aortic root enlargement measuring up to 4.2 cm. The ascending thoracic aorta is otherwise normal in caliber. The descending thoracic aorta is normal in caliber. Severe atherosclerotic plaque of the thoracic aorta. The pericardium demonstrates no acute abnormality. LYMPH NODES: No mediastinal, hilar or axillary lymphadenopathy. LUNGS AND PLEURA: Bilateral lower lobe passive atelectasis. Diffuse bronchial wall thickening. Expiratory phase respiration. Small left and moderate right pleural effusions. No pneumothorax. UPPER ABDOMEN: Calcified gallstones within the gallbladder lumen. Gallbladder partially visualized. SOFT TISSUES AND BONES: Severe deformity of the lateral glenohumeral joints. Left chest wall dual-lead pacemaker. No acute soft tissue abnormality. IMPRESSION: 1. No pulmonary embolus. 2. Mild pulmonary edema with associated small left and moderate right pleural effusions with bilateral lower lobe passive atelectasis. 3. Aortic root enlargement measuring up to 4.2 cm, with the remainder of the thoracic aorta normal in caliber and severe atherosclerotic plaque. Electronically signed by: Morgane Naveau MD 04/04/2024 01:33 PM EST RP Workstation: [id number]   DG Chest 2 View Result Date: 04/04/2024 CLINICAL DATA:  Shortness of breath. EXAM: CHEST - 2 VIEW COMPARISON:  08/26/2021 FINDINGS: The cardio pericardial silhouette is enlarged. Bibasilar atelectasis/infiltrate noted. Tiny bilateral pleural effusions suspected. Dual lead left-sided permanent pacemaker again noted. No acute bony abnormality. Degenerative changes evident in both shoulders. IMPRESSION: Bibasilar atelectasis/infiltrate with tiny bilateral pleural effusions. Electronically Signed   By: Camellia Candle M.D.   On: 04/04/2024 11:59     Assessment  and Plan:  1. Acute on chronic HFrEF - LVEF diminished in 2023 without interim  recheck once clinically improved - if LV remains low, will be unclear if he had any interim improvement or a persistence of dysfunction - echo is pending - agree with IV Lasix  as outlined - follow BP  2. Frequent PVCs, underlying hx of PPM - potassium wnl - check TSH - add Mg level to lab; added care order to notify cardiology when this returns - would keep K 4.0 or greater and Mg 2.0 or greater - recommend rounding team interrogate pacemaker in AM to ensure normal function and assess burden if possible - hold off adding BB pending MD review given acute decompensation of CHF (note HR at 02/2024 OV 95, here high 90s/low 100s)  3. Essential HTN - follow in context above  4. PVD - overdue for vascular f/u as well, maintained on ASA/Plavix   5. Hx of extensive DVT/PE with IVC filter in 2023 - CTA negative for PE here  6. Mild nonobstructive CAD in 2022, mildly elevated troponin - initial troponin 39 not yet trended, will recheck - care order placed to notify cardiology when this returns, suspect demand ischemia. Do not anticipate initiation of heparin  unless substantial rise - already on DAPT for PV reasons  - add lipid panel in AM, unclear why no longer on a statin, can revisit with labs in AM  7. Mild dilation of aortic root by CTA - consider f/u CTA in 1 year, can be arranged outpatient  8. CLL, thrombocytosis - per primary team - preliminarily did not see any data linking acalabrutinib  to HF  Risk Assessment/Risk Scores:      [place]  Heart Association (NYHA) Functional Class NYHA Class III-IV on arrival   For questions or updates, please contact [facility] Please consult [email or web address] for contact info under      Signed, Dayna N Dunn, PA-C  04/04/2024 6:55 PM  Attending Note  Patient seen and discussed with PA Dunn, I agree with her documentation  73 yo male history of CLL, high grade heart block now with permanent pacemaker, HTN, DM, right AKA, prior  DVT/PE off eliquis  now due to hematuria now with IVC filter,  admitted with SOB progressing x 1 month along with orthopnea.    Na 130 K 4.2 BUN 17 Cr 0.71 WBC 8.6 Hgb 15.3 Plt 658 proBNP 1422  Trop 39--> CXR: bibasilar infiltrate, tiny bilateral pleural effusions CT PE: no PE, mild pulm edema with small left and moderate right pleural effusions. Aortic roont 4.2 cm  08/2021 limited echo: 35-40% 07/2021 echo: 07/2021 echo: LVEF 45-50%, no WMAs, indet diastolic, severe RV dysfunction, mod pulm HTN (this was in setting of PE)     1.Acute on chronic HFrEF - presented with progressive SOB/DOE, LLE edema (has right sided amputation), orthopnea 07/2021 echo: LVEF 45-50%, no WMAs, indet diastolic, severe RV dysfunction, mod pulm HTN (this was in setting of PE) - 08/2021 limited echo: LVEF 35-40% - repeat echo pending - CT PE no PE this admit, small to moderate effusions, mild pulm edema. proBNP 1422 - started on IV lasix . Remains fluid overloaded, will continue - further recs pending echo results  2.PAD - DAPT per vascular, not for cardiac indication.   3.PPM - 01/2024 device check normal function   4.Aortic root enlargement - measured 4.2cm by CT, repeat CT 1 year.   Dorn Ross MD     [1] No Known Allergies  "

## 2024-04-04 NOTE — Assessment & Plan Note (Signed)
" °    Latest Ref Rng & Units 04/04/2024   11:04 AM 03/13/2024   10:17 AM 02/12/2024   10:30 AM  CBC  WBC 4.0 - 10.5 K/uL 8.6  7.8  9.5   Hemoglobin 13.0 - 17.0 g/dL 84.6  84.6  84.8   Hematocrit 39.0 - 52.0 % 43.2  44.0  44.0   Platelets 150 - 400 K/uL 658  664  704   Continue hydroxyurea .   "

## 2024-04-04 NOTE — H&P (Signed)
 " History and Physical    Patient: Douglas [name] FMW:[id number] DOB: 08/02/1950 DOA: 04/04/2024 DOS: the patient was seen and examined on 04/04/2024 . PCP: Kip Righter, MD  Patient coming from: Home Chief complaint: Chief Complaint  Patient presents with   Shortness of Breath   HPI:  Douglas [name] is a 73 y.o. 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 Does get routine PM monitoring but has not followed up with cardiology in few years. 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    ED Course:  Vital signs in the ED were notable for the following: vitals shows tachycardia and on 2 L [place]. Vitals:   04/04/24 1101 04/04/24 1101 04/04/24 1104 04/04/24 1427  BP:  (!) 178/76 (!) 178/76 (!) 161/78  Pulse:  (!) 104 (!) 104 (!) 110  Temp:  98 F (36.7 C) 98 F (36.7 C)   Resp:  20 20 20   Height: 6' (1.829 m)     Weight: 99.8 kg     SpO2:  97% 97% 97%  TempSrc:  Oral    BMI (Calculated): 29.83      >>ED evaluation thus far shows: - BMP showing sodium of 130 glucose of 259 and normal lft's.  - BNP of 1422 , TNI of 39. - CBC shows wbc of 8.6 hb of 15.3, plt of 658.  - INR of 1.0 - chest xray shows BL atelectasis and infiltrate and tiny BL pleural effusion.  - CTA is negative for pe shows pleural effusion and increased in aortic arch diameter.  - EKG:  ventricular paced rhythm at 107 with a QTc 523 as expected QRS of 176.   >>While in the ED patient received the following: Medications  iohexol  (OMNIPAQUE ) 350 MG/ML injection 75 mL (75 mLs Intravenous Contrast Given 04/04/24 1323)  furosemide  (LASIX ) injection 40 mg (40 mg Intravenous Given 04/04/24 1356)   Review of Systems   Respiratory:  Positive for shortness of breath.   Cardiovascular:  Negative for chest pain.   Past Medical History:  Diagnosis Date   BPH (benign prostatic hyperplasia)    CLL (chronic lymphocytic leukemia) (HCC)    Hypertension    Pacemaker    [employer]tronic Device   Pulmonary embolism (HCC)    PVD (peripheral vascular disease)    s/p stent, thrombectomy   Past Surgical History:  Procedure Laterality Date   ABDOMINAL AORTOGRAM W/LOWER EXTREMITY Bilateral 06/21/2021   Procedure: ABDOMINAL AORTOGRAM W/LOWER EXTREMITY;  Surgeon: Sheree Penne Bruckner, MD;  Location: [facility] INVASIVE CV LAB;  Service: Cardiovascular;  Laterality: Bilateral;   ABDOMINAL AORTOGRAM W/LOWER EXTREMITY N/A 08/04/2021   Procedure: ABDOMINAL AORTOGRAM W/LOWER EXTREMITY;  Surgeon: Serene Gaile ORN, MD;  Location: [facility] INVASIVE CV LAB;  Service: Cardiovascular;  Laterality: N/A;   AMPUTATION Right 06/25/2021   Procedure: RIGHT BELOW KNEE AMPUTATION;  Surgeon: Sheree Penne Bruckner, MD;  Location: [facility] OR;  Service: Vascular;  Laterality: Right;   AMPUTATION Right 07/19/2021   Procedure: AMPUTATION ABOVE KNEE;  Surgeon: Eliza Bruckner RAMAN, MD;  Location: [facility] OR;  Service: Vascular;  Laterality: Right;   FEMORAL-POPLITEAL BYPASS GRAFT Right 06/22/2021   Procedure: RIGHT FEMORAL-POPLITEAL BYPASS WITH VEIN, RIGHT POPLITEAL THROMBECTOMY;  Surgeon:  Sheree Penne Bruckner, MD;  Location: [facility] OR;  Service: Vascular;  Laterality: Right;   IVC FILTER INSERTION N/A 08/30/2021   Procedure: IVC FILTER INSERTION;  Surgeon: Sheree Penne Bruckner, MD;  Location: [facility] INVASIVE CV LAB;  Service: Cardiovascular;  Laterality: N/A;   LEFT HEART CATH AND CORONARY ANGIOGRAPHY N/A 01/18/2021   Procedure: LEFT HEART CATH AND CORONARY ANGIOGRAPHY;  Surgeon: Verlin Bruckner BIRCH, MD;  Location: [facility] INVASIVE CV LAB;  Service: Cardiovascular;  Laterality: N/A;   LOWER EXTREMITY VENOGRAPHY Left 08/06/2021   Procedure: LOWER EXTREMITY VENOGRAPHY;   Surgeon: Magda Debby SAILOR, MD;  Location: [facility] INVASIVE CV LAB;  Service: Cardiovascular;  Laterality: Left;   PACEMAKER IMPLANT N/A 01/19/2021   Procedure: PACEMAKER IMPLANT;  Surgeon: Waddell Danelle ORN, MD;  Location: [facility] INVASIVE CV LAB;  Service: Cardiovascular;  Laterality: N/A;   PERIPHERAL VASCULAR INTERVENTION  08/04/2021   Procedure: PERIPHERAL VASCULAR INTERVENTION;  Surgeon: Serene Gaile ORN, MD;  Location: [facility] INVASIVE CV LAB;  Service: Cardiovascular;;   PERIPHERAL VASCULAR THROMBECTOMY  08/06/2021   Procedure: PERIPHERAL VASCULAR THROMBECTOMY;  Surgeon: Magda Debby SAILOR, MD;  Location: [facility] INVASIVE CV LAB;  Service: Cardiovascular;;  Left Popliteal Vein    reports that he has quit smoking. His smoking use included cigarettes. He has a 5 pack-year smoking history. He has never been exposed to tobacco smoke. He has never used smokeless tobacco. He reports that he does not currently use alcohol. He reports that he does not use drugs. Allergies[1] Family History  Problem Relation Age of Onset   Lung cancer Mother    Coronary artery disease Father    Prior to Admission medications  Medication Sig Start Date End Date Taking? Authorizing Provider  acalabrutinib  maleate (CALQUENCE ) 100 MG tablet Take 1 tablet (100 mg) by mouth 2 (two) times daily. 03/18/24   Cloretta Arley NOVAK, MD  acetaminophen  (TYLENOL ) 500 MG tablet Take 500 mg by mouth at bedtime.    [provider]  albuterol  (PROVENTIL ) (2.5 MG/3ML) 0.083% nebulizer solution Take 2.5 mg by nebulization every 6 (six) hours as needed for wheezing or shortness of breath. Patient not taking: Reported on 02/12/2024    [provider]  albuterol  (VENTOLIN  HFA) 108 (90 Base) MCG/ACT inhaler Inhale 2 puffs into the lungs every 6 (six) hours as needed.    [provider]  amLODipine  (NORVASC ) 10 MG tablet Take 10 mg by mouth daily. 05/10/22   [provider]  aspirin  EC 81 MG tablet Take 81 mg by mouth daily. Swallow  whole.    [provider]  bisacodyl  (DULCOLAX) 5 MG EC tablet Take 5 mg by mouth daily.    [provider]  clopidogrel  (PLAVIX ) 75 MG tablet Take 75 mg by mouth daily. 03/15/22   [provider]  docusate sodium  (COLACE) 100 MG capsule Take 1 capsule (100 mg total) by mouth 2 (two) times daily. Patient not taking: Reported on 02/12/2024 07/15/21   Angiulli, Toribio PARAS, PA-C  finasteride  (PROSCAR ) 5 MG tablet Take 1 tablet (5 mg total) by mouth daily. 07/15/21   Angiulli, Toribio PARAS, PA-C  gabapentin  (NEURONTIN ) 300 MG capsule Take 300 mg by mouth 2 (two) times daily. 01/31/23   [provider]  hydroxyurea  (HYDREA ) 500 MG capsule Take 3 capsules (1,500 mg total) by mouth daily. 02/12/24   Cloretta Arley NOVAK, MD  melatonin 5 MG TABS Take 1 tablet (5 mg total) by mouth at bedtime. 07/15/21   Angiulli, Toribio PARAS, PA-C  metFORMIN (GLUCOPHAGE) 500  MG tablet Take 500 mg by mouth 2 (two) times daily.    [provider]  nystatin  cream (MYCOSTATIN ) Apply 1 application Externally Twice a day Patient not taking: Reported on 02/12/2024 12/13/23     Oxymetazoline  HCl (MUCINEX  NASAL SPRAY FULL FORCE NA) Place 1 spray into the nose 2 (two) times daily as needed (congestion).    [provider]  traZODone  (DESYREL ) 50 MG tablet Take 50 mg by mouth at bedtime. 05/27/22   [provider]  valsartan -hydrochlorothiazide  (DIOVAN -HCT) 320-25 MG tablet Take 1 tablet by mouth daily. 05/04/22   [provider]                                                                                 Vitals:   04/04/24 1101 04/04/24 1101 04/04/24 1104 04/04/24 1427  BP:  (!) 178/76 (!) 178/76 (!) 161/78  Pulse:  (!) 104 (!) 104 (!) 110  Resp:  20 20 20   Temp:  98 F (36.7 C) 98 F (36.7 C)   TempSrc:  Oral    SpO2:  97% 97% 97%  Weight: 99.8 kg     Height: 6' (1.829 m)      Physical Exam Vitals reviewed.  Constitutional:      General: He is not in acute distress.     Appearance: He is not ill-appearing.  HENT:     Head: Normocephalic.  Eyes:     Extraocular Movements: Extraocular movements intact.  Cardiovascular:     Rate and Rhythm: Normal rate and regular rhythm.     Heart sounds: Normal heart sounds.  Pulmonary:     Breath sounds: Normal breath sounds.  Abdominal:     General: There is no distension.     Palpations: Abdomen is soft.     Tenderness: There is no abdominal tenderness.  Musculoskeletal:     Right lower leg: No edema.     Left lower leg: No edema.  Neurological:     General: No focal deficit present.     Mental Status: He is alert and oriented to person, place, and time.     Labs on Admission: I have personally reviewed following labs and imaging studies CBC: Recent Labs  Lab 04/04/24 1104  WBC 8.6  HGB 15.3  HCT 43.2  MCV 111.9*  PLT 658*   Basic Metabolic Panel: Recent Labs  Lab 04/04/24 1104  NA 130*  K 4.2  CL 92*  CO2 23  GLUCOSE 259*  BUN 17  CREATININE 0.71  CALCIUM  9.4   GFR: Estimated Creatinine Clearance: 97.6 mL/min (by C-G formula based on SCr of 0.71 mg/dL). Liver Function Tests: Recent Labs  Lab 04/04/24 1125  AST 23  ALT 21  ALKPHOS 65  BILITOT 0.4  PROT 6.4*  ALBUMIN  4.2   No results for input(s): LIPASE, AMYLASE in the last 168 hours. No results for input(s): AMMONIA in the last 168 hours. Recent Labs    05/25/23 0955 06/19/23 0948 08/14/23 0946 01/08/24 1025 04/04/24 1104  BUN 12 10 15 12 17   CREATININE 0.56* 0.59* 0.67 0.70 0.71    Cardiac Enzymes: No results for input(s): CKTOTAL, CKMB, CKMBINDEX, TROPONINI in the last  168 hours. BNP (last 3 results) Recent Labs    04/04/24 1125  PROBNP 1,422.0*   HbA1C: No results for input(s): HGBA1C in the last 72 hours. CBG: No results for input(s): GLUCAP in the last 168 hours. Lipid Profile: No results for input(s): CHOL, HDL, LDLCALC, TRIG, CHOLHDL, LDLDIRECT in the last 72 hours. Thyroid  Function Tests: No results for input(s): TSH, T4TOTAL, FREET4, T3FREE, THYROIDAB in the last 72 hours. Anemia Panel: No results for input(s): VITAMINB12, FOLATE, FERRITIN, TIBC, IRON, RETICCTPCT in the last 72 hours. Urine analysis:    Component Value Date/Time   COLORURINE AMBER (A) 01/24/2022 1138   APPEARANCEUR TURBID (A) 01/24/2022 1138   LABSPEC 1.017 01/24/2022 1138   PHURINE 6.0 01/24/2022 1138   GLUCOSEU NEGATIVE 01/24/2022 1138   HGBUR SMALL (A) 01/24/2022 1138   BILIRUBINUR NEGATIVE 01/24/2022 1138   KETONESUR NEGATIVE 01/24/2022 1138   PROTEINUR 100 (A) 01/24/2022 1138   NITRITE NEGATIVE 01/24/2022 1138   LEUKOCYTESUR LARGE (A) 01/24/2022 1138   Radiological Exams on Admission: CT Angio Chest PE W and/or Wo Contrast Result Date: 04/04/2024 EXAM: CTA CHEST 04/04/2024 01:24:00 PM TECHNIQUE: CTA of the chest was performed without and with the administration of 75 mL of iohexol  (OMNIPAQUE ) 350 MG/ML injection. Multiplanar reformatted images are provided for review. MIP images are provided for review. Automated exposure control, iterative reconstruction, and/or weight based adjustment of the mA/kV was utilized to reduce the radiation dose to as low as reasonably achievable. COMPARISON: Chest x-ray 04/04/2024. CLINICAL HISTORY: Pulmonary embolism (PE) suspected, high prob. FINDINGS: PULMONARY ARTERIES: Pulmonary arteries are adequately opacified for evaluation. No acute pulmonary embolus. Main pulmonary artery is normal in caliber. MEDIASTINUM: The heart demonstrates 4-vessel coronary artery calcification and mild interlateral wall thickening. Aortic root enlargement measuring up to 4.2 cm. The ascending thoracic aorta is otherwise normal in caliber. The descending thoracic aorta is normal in caliber. Severe atherosclerotic plaque of the thoracic aorta. The pericardium demonstrates no acute abnormality. LYMPH NODES: No mediastinal, hilar or axillary lymphadenopathy.  LUNGS AND PLEURA: Bilateral lower lobe passive atelectasis. Diffuse bronchial wall thickening. Expiratory phase respiration. Small left and moderate right pleural effusions. No pneumothorax. UPPER ABDOMEN: Calcified gallstones within the gallbladder lumen. Gallbladder partially visualized. SOFT TISSUES AND BONES: Severe deformity of the lateral glenohumeral joints. Left chest wall dual-lead pacemaker. No acute soft tissue abnormality. IMPRESSION: 1. No pulmonary embolus. 2. Mild pulmonary edema with associated small left and moderate right pleural effusions with bilateral lower lobe passive atelectasis. 3. Aortic root enlargement measuring up to 4.2 cm, with the remainder of the thoracic aorta normal in caliber and severe atherosclerotic plaque. Electronically signed by: Morgane Naveau MD 04/04/2024 01:33 PM EST RP Workstation: [id number]   DG Chest 2 View Result Date: 04/04/2024 CLINICAL DATA:  Shortness of breath. EXAM: CHEST - 2 VIEW COMPARISON:  08/26/2021 FINDINGS: The cardio pericardial silhouette is enlarged. Bibasilar atelectasis/infiltrate noted. Tiny bilateral pleural effusions suspected. Dual lead left-sided permanent pacemaker again noted. No acute bony abnormality. Degenerative changes evident in both shoulders. IMPRESSION: Bibasilar atelectasis/infiltrate with tiny bilateral pleural effusions. Electronically Signed   By: Camellia Candle M.D.   On: 04/04/2024 11:59   Data Reviewed: Relevant notes from primary care and specialist visits, past discharge summaries as available in EHR, including Care Everywhere . Prior diagnostic testing as pertinent to current admission diagnoses, Updated medications and problem lists for reconciliation .ED course, including vitals, labs, imaging, treatment and response to treatment,Triage notes, nursing and pharmacy notes and ED provider's notes.Notable results as  noted in HPI.Discussed case with EDMD/ ED APP/ or Specialty MD on call and as needed.  Assessment &  Plan  SOB (shortness of breath): Most likely secondary to acute on chronic diastolic congestive heart failure.will admit with pulse oximetry tele monitoring, daily weights and strict I/O's.2 D Echo, and cardiology consult for pacemaker check.Currently PTA meds include amlodipine , valsartan  and hydrochlorothiazide  and will hold all three to allow room for diuretic regimen over next 24 hours or so.  Today weight is 220 pounds 99.8 Kg. SpO2: 97 % O2 Flow Rate (L/min): 2 L/min    Hypertension: Vitals:   04/04/24 1101 04/04/24 1104 04/04/24 1427  BP: (!) 178/76 (!) 178/76 (!) 161/78  Scheduled lasix  and PRN hydralazine .  Hold PTA meds.    PAD: Continue patient on aspirin  81 Plavix  75, med reconciliation is pending I do not see a statin or an allergy, and will recommend patient be started on same will obtain a lipid panel as add-on.  Diabetes mellitus (HCC) Carb consistent diet and glycemic protocol.  Hold metformin.   CLL (chronic lymphocytic leukemia) (HCC) Currently stable and on treatment with acalabrutinib .  BPH (benign prostatic hyperplasia) Continue flomax, C/H indwelling foley ,bladder scan as needed.   Thrombocytosis    Latest Ref Rng & Units 04/04/2024   11:04 AM 03/13/2024   10:17 AM 02/12/2024   10:30 AM  CBC  WBC 4.0 - 10.5 K/uL 8.6  7.8  9.5   Hemoglobin 13.0 - 17.0 g/dL 84.6  84.6  84.8   Hematocrit 39.0 - 52.0 % 43.2  44.0  44.0   Platelets 150 - 400 K/uL 658  664  704   Continue hydroxyurea .    DVT prophylaxis:  Heparin .   Consults:  None.  Advance Care Planning:    Code Status: Full Code   Family Communication:  None.  Disposition Plan:  Home.  Severity of Illness: The appropriate patient status for this patient is INPATIENT. Inpatient status is judged to be reasonable and necessary in order to provide the required intensity of service to ensure the patient's safety. The patient's presenting symptoms, physical exam findings, and initial radiographic  and laboratory data in the context of their chronic comorbidities is felt to place them at high risk for further clinical deterioration. Furthermore, it is not anticipated that the patient will be medically stable for discharge from the hospital within 2 midnights of admission.   * I certify that at the point of admission it is my clinical judgment that the patient will require inpatient hospital care spanning beyond 2 midnights from the point of admission due to high intensity of service, high risk for further deterioration and high frequency of surveillance required.*  Unresulted Labs (From admission, onward)     Start     Ordered   04/05/24 0500  CBC with Differential/Platelet  Tomorrow morning,   R        04/04/24 1545   04/05/24 0500  Comprehensive metabolic panel with GFR  Tomorrow morning,   R        04/04/24 1545   04/05/24 0500  Magnesium   Tomorrow morning,   R        04/04/24 1545   04/04/24 1510  Hemoglobin A1c  Once,   R       Comments: To assess prior glycemic control    04/04/24 1512           Meds ordered this encounter  Medications   iohexol  (OMNIPAQUE ) 350 MG/ML injection 75  mL   furosemide  (LASIX ) injection 40 mg   aspirin  EC tablet 81 mg   hydroxyurea  (HYDREA ) capsule 1,500 mg   acalabrutinib  maleate (CALQUENCE ) tablet 100 mg   traZODone  (DESYREL ) tablet 50 mg   finasteride  (PROSCAR ) tablet 5 mg   clopidogrel  (PLAVIX ) tablet 75 mg   gabapentin  (NEURONTIN ) capsule 300 mg   albuterol  (PROVENTIL ) (2.5 MG/3ML) 0.083% nebulizer solution 2.5 mg   furosemide  (LASIX ) injection 40 mg   insulin  aspart (novoLOG ) injection 0-15 Units    Correction coverage::   Moderate (average weight, post-op)    CBG < 70::   Implement Hypoglycemia Standing Orders and refer to Hypoglycemia Standing Orders sidebar report    CBG 70 - 120::   0 units    CBG 121 - 150::   2 units    CBG 151 - 200::   3 units    CBG 201 - 250::   5 units    CBG 251 - 300::   8 units    CBG 301 - 350::    11 units    CBG 351 - 400::   15 units    CBG > 400:   call MD and obtain STAT lab verification   insulin  aspart (novoLOG ) injection 0-5 Units    Correction coverage::   HS scale    CBG < 70::   Implement Hypoglycemia Standing Orders and refer to Hypoglycemia Standing Orders sidebar report    CBG 70 - 120::   0 units    CBG 121 - 150::   0 units    CBG 151 - 200::   0 units    CBG 201 - 250::   2 units    CBG 251 - 300::   3 units    CBG 301 - 350::   4 units    CBG 351 - 400::   5 units    CBG > 400:   call MD and obtain STAT lab verification   sodium chloride  flush (NS) 0.9 % injection 3 mL   OR Linked Order Group    acetaminophen  (TYLENOL ) tablet 650 mg    acetaminophen  (TYLENOL ) suppository 650 mg   traZODone  (DESYREL ) tablet 25 mg   polyethylene glycol (MIRALAX  / GLYCOLAX ) packet 17 g   OR Linked Order Group    ondansetron  (ZOFRAN ) tablet 4 mg    ondansetron  (ZOFRAN ) injection 4 mg   hydrALAZINE  (APRESOLINE ) injection 5 mg   heparin  injection 5,000 Units   Orders Placed This Encounter  Procedures   Critical Care   Resp panel by RT-PCR (RSV, Flu A&B, Covid) Anterior Nasal Swab   DG Chest 2 View   CT Angio Chest PE W and/or Wo Contrast   Basic metabolic panel   CBC   Hepatic function panel   Protime-INR   Pro Brain natriuretic peptide   Hemoglobin A1c   CBC with Differential/Platelet   Comprehensive metabolic panel with GFR   Magnesium    Diet heart healthy/carb modified Room service appropriate? Yes; Fluid consistency: Thin; Fluid restriction: 1200 mL Fluid   Document Height and Actual Weight   If O2 Sat <94% administer O2 at 2 liters/minute via nasal cannula   Apply Diabetes Mellitus Care Plan   STAT CBG when hypoglycemia is suspected. If treated, recheck every 15 minutes after each treatment until CBG >/= 70 mg/dl   Refer to Hypoglycemia Protocol Sidebar Report for treatment of CBG < 70 mg/dl   Notify physician (specify)   Initiate Heart Failure Care Plan  Daily  weights   Strict intake and output   In and Out Cath   Patient Education:   Apply Heart Failure Care Plan   [facility] and [facility] only) Obtain REDS clips reading Every morning   Apply Diabetes Mellitus Care Plan   STAT CBG when hypoglycemia is suspected. If treated, recheck every 15 minutes after each treatment until CBG >/= 70 mg/dl   Refer to Hypoglycemia Protocol Sidebar Report for treatment of CBG < 70 mg/dl   Cardiac Monitoring Continuous x 24 hours Indications for use: Sub-acute heart failure   Maintain IV access   Vital signs   Notify physician (specify)   Refer to Sidebar Report Mobility Protocol for Adult Inpatient   Initiate Adult Central Line Maintenance and Catheter Clearance Protocol for patients with central line (CVC, PICC, Port, Hemodialysis, Trialysis)   If patient diabetic or glucose greater than 140 notify physician for Sliding Scale Insulin  Orders   Initiate CHG Protocol for patients in ICU/SD or any patient with a central line or foley catheter   Do not place and if present remove PureWick   Initiate Oral Care Protocol   Initiate Carrier Fluid Protocol   RN may order General Admission PRN Orders utilizing General Admission PRN medications (through manage orders) for the following patient needs: allergy symptoms (Claritin), cold sores (Carmex), cough (Robitussin DM), eye irritation (Liquifilm Tears), hemorrhoids (Tucks), indigestion (Maalox), minor skin irritation (Hydrocortisone Cream), muscle pain (Ben Gay), nose irritation (saline nasal spray) and sore throat (Chloraseptic spray).   Ambulate with assistance   Full code   Consult to hospitalist   Inpatient consult to Cardiology Consult Timeframe: ROUTINE - requires response within 24 hours; Reason for Consult? CHF Already called   Consult to Transition of Care Team   Consult to Heart Failure Navigation Team [facility], [facility], and [facility])   Nutritional services consult   OT eval and treat   PT eval and treat   Pulse oximetry check  with vital signs   Oxygen therapy Mode or (Route): Nasal cannula; Liters Per Minute: 2; Keep O2 saturation between: greater than 92 %   Incentive spirometry   ED EKG   EKG 12-Lead   ECHOCARDIOGRAM COMPLETE   Insert peripheral IV   Admit to Inpatient (patient's expected length of stay will be greater than 2 midnights or inpatient only procedure)   Aspiration precautions   Fall precautions   Author: Mario LULLA Blanch, MD 12 pm- 8 pm. [facility]. 04/04/2024 3:45 PM Please note for any communication after hours contact [facility] Assigned provider on call on Amion.     [1] No Known Allergies  "

## 2024-04-04 NOTE — Assessment & Plan Note (Addendum)
 Currently stable and on treatment with acalabrutinib . Continue with hydroxyurea .

## 2024-04-04 NOTE — Assessment & Plan Note (Deleted)
 Vitals:   04/04/24 1101 04/04/24 1104 04/04/24 1427  BP: (!) 178/76 (!) 178/76 (!) 161/78  Scheduled lasix  and PRN hydralazine .  Hold PTA meds.

## 2024-04-04 NOTE — Assessment & Plan Note (Deleted)
 Most likely secondary to acute on chronic diastolic congestive heart failure.will admit with pulse oximetry tele monitoring, daily weights and strict I/O's.2 D Echo, and cardiology consult for pacemaker check.Currently PTA meds include amlodipine , valsartan  and hydrochlorothiazide  and will hold all three to allow room for diuretic regimen over next 24 hours or so.  Last Weight  Most recent update: 04/04/2024 11:01 AM    Weight  99.8 kg (220 lb)

## 2024-04-04 NOTE — ED Provider Triage Note (Signed)
 Emergency Medicine Provider Triage Evaluation Note  Douglas [name] , a 73 y.o. male  was evaluated in triage.  Pt complains of shortness of breath x 30 days accompanied with congestion. Came in today because he noted he could not catch his breath even while at rest. Worse on exertion and when laying. Normally on room air, placed on 2L for comfort by EMS.   Hx CLL currently getting treatment, thrombocytosis and PE. Taking plavix  with no missed doses.   Noticed swelling in bilateral upper extremity x 3 month worse on R.   Denies fever, headaches, vision changes, abdominal pain, n/v/d, dysuria.   Review of Systems  Positive: N/a Negative: N/a  Physical Exam  BP (!) 178/76   Pulse (!) 104   Temp 98 F (36.7 C)   Resp 20   Ht 6' (1.829 m)   Wt 99.8 kg   SpO2 97%   BMI 29.84 kg/m  Gen:   Awake, no distress   Resp:  Normal effort  MSK:   Moves extremities without difficulty  Other:    Medical Decision Making  Medically screening exam initiated at 11:20 AM.  Appropriate orders placed.  Rogerick Danzy was informed that the remainder of the evaluation will be completed by another provider, this initial triage assessment does not replace that evaluation, and the importance of remaining in the ED until their evaluation is complete.     Beola Terrall RAMAN, [place] 04/04/24 1124

## 2024-04-04 NOTE — Assessment & Plan Note (Addendum)
 Continue tamsulosin, continue foley catheter  Follows with Urology as outpatient

## 2024-04-04 NOTE — ED Triage Notes (Addendum)
 Patient arrives [facility] from home for shob x1 month. Received pneumonia vaccine and shob since. Edema in arms and stomach as well as left leg x3 months. Placed on 2L for comfort. Dyspnea w exertion.   EMS vitals 108 HR BP 164/82 96 on room air, placed on 2L [place] 98

## 2024-04-04 NOTE — ED Notes (Signed)
 Patient transported to X-ray

## 2024-04-05 ENCOUNTER — Inpatient Hospital Stay (HOSPITAL_COMMUNITY)

## 2024-04-05 DIAGNOSIS — I2510 Atherosclerotic heart disease of native coronary artery without angina pectoris: Secondary | ICD-10-CM

## 2024-04-05 DIAGNOSIS — I5023 Acute on chronic systolic (congestive) heart failure: Secondary | ICD-10-CM

## 2024-04-05 DIAGNOSIS — N401 Enlarged prostate with lower urinary tract symptoms: Secondary | ICD-10-CM

## 2024-04-05 DIAGNOSIS — D75839 Thrombocytosis, unspecified: Secondary | ICD-10-CM | POA: Diagnosis not present

## 2024-04-05 DIAGNOSIS — I10 Essential (primary) hypertension: Secondary | ICD-10-CM

## 2024-04-05 DIAGNOSIS — Z89611 Acquired absence of right leg above knee: Secondary | ICD-10-CM

## 2024-04-05 DIAGNOSIS — E440 Moderate protein-calorie malnutrition: Secondary | ICD-10-CM

## 2024-04-05 DIAGNOSIS — E785 Hyperlipidemia, unspecified: Secondary | ICD-10-CM

## 2024-04-05 DIAGNOSIS — C9110 Chronic lymphocytic leukemia of B-cell type not having achieved remission: Secondary | ICD-10-CM | POA: Diagnosis not present

## 2024-04-05 DIAGNOSIS — I5021 Acute systolic (congestive) heart failure: Secondary | ICD-10-CM

## 2024-04-05 DIAGNOSIS — Z86711 Personal history of pulmonary embolism: Secondary | ICD-10-CM | POA: Diagnosis not present

## 2024-04-05 DIAGNOSIS — E66811 Obesity, class 1: Secondary | ICD-10-CM | POA: Diagnosis not present

## 2024-04-05 DIAGNOSIS — I77810 Thoracic aortic ectasia: Secondary | ICD-10-CM

## 2024-04-05 DIAGNOSIS — I739 Peripheral vascular disease, unspecified: Secondary | ICD-10-CM

## 2024-04-05 DIAGNOSIS — E1169 Type 2 diabetes mellitus with other specified complication: Secondary | ICD-10-CM

## 2024-04-05 DIAGNOSIS — I700 Atherosclerosis of aorta: Secondary | ICD-10-CM

## 2024-04-05 LAB — GLUCOSE, CAPILLARY
Glucose-Capillary: 209 mg/dL — ABNORMAL HIGH (ref 70–99)
Glucose-Capillary: 159 mg/dL — ABNORMAL HIGH (ref 70–99)
Glucose-Capillary: 130 mg/dL — ABNORMAL HIGH (ref 70–99)
Glucose-Capillary: 184 mg/dL — ABNORMAL HIGH (ref 70–99)

## 2024-04-05 LAB — CBC WITH DIFFERENTIAL/PLATELET
Basophils Absolute: 0.3 K/uL — ABNORMAL HIGH (ref 0.0–0.1)
Basophils Relative: 4 %
Eosinophils Absolute: 0 K/uL (ref 0.0–0.5)
Eosinophils Relative: 0 %
HCT: 40.1 % (ref 39.0–52.0)
Hemoglobin: 14.2 g/dL (ref 13.0–17.0)
Lymphocytes Relative: 18 %
Lymphs Abs: 1.3 K/uL (ref 0.7–4.0)
MCH: 39.2 pg — ABNORMAL HIGH (ref 26.0–34.0)
MCHC: 35.4 g/dL (ref 30.0–36.0)
MCV: 110.8 fL — ABNORMAL HIGH (ref 80.0–100.0)
Monocytes Absolute: 0.3 K/uL (ref 0.1–1.0)
Monocytes Relative: 4 %
Neutro Abs: 5.4 K/uL (ref 1.7–7.7)
Neutrophils Relative %: 74 %
Platelets: 551 K/uL — ABNORMAL HIGH (ref 150–400)
RBC: 3.62 MIL/uL — ABNORMAL LOW (ref 4.22–5.81)
RDW: 17.1 % — ABNORMAL HIGH (ref 11.5–15.5)
WBC: 7.3 K/uL (ref 4.0–10.5)
nRBC: 0.4 % — ABNORMAL HIGH (ref 0.0–0.2)

## 2024-04-05 LAB — COMPREHENSIVE METABOLIC PANEL WITH GFR
ALT: 18 U/L (ref 0–44)
AST: 29 U/L (ref 15–41)
Albumin: 3.8 g/dL (ref 3.5–5.0)
Alkaline Phosphatase: 54 U/L (ref 38–126)
Anion gap: 13 (ref 5–15)
BUN: 18 mg/dL (ref 8–23)
CO2: 27 mmol/L (ref 22–32)
Calcium: 8.9 mg/dL (ref 8.9–10.3)
Chloride: 94 mmol/L — ABNORMAL LOW (ref 98–111)
Creatinine, Ser: 0.81 mg/dL (ref 0.61–1.24)
GFR, Estimated: >60 mL/min
Glucose, Bld: 145 mg/dL — ABNORMAL HIGH (ref 70–99)
Potassium: 3.6 mmol/L (ref 3.5–5.1)
Sodium: 133 mmol/L — ABNORMAL LOW (ref 135–145)
Total Bilirubin: 0.4 mg/dL (ref 0.0–1.2)
Total Protein: 5.9 g/dL — ABNORMAL LOW (ref 6.5–8.1)

## 2024-04-05 LAB — LIPID PANEL
Cholesterol: 131 mg/dL (ref 0–200)
HDL: 30 mg/dL — ABNORMAL LOW
LDL Cholesterol: 44 mg/dL (ref 0–99)
Total CHOL/HDL Ratio: 4.4 ratio
Triglycerides: 285 mg/dL — ABNORMAL HIGH
VLDL: 57 mg/dL — ABNORMAL HIGH (ref 0–40)

## 2024-04-05 LAB — ECHOCARDIOGRAM COMPLETE
AR max vel: 2.69 cm2
AV Area VTI: 3.25 cm2
AV Area mean vel: 2.64 cm2
AV Mean grad: 5.5 mmHg
AV Peak grad: 9.8 mmHg
Ao pk vel: 1.57 m/s
Area-P 1/2: 4.49 cm2
Height: 72 in
S' Lateral: 4.86 cm
Weight: 3731.95 [oz_av]

## 2024-04-05 LAB — MAGNESIUM: Magnesium: 2.3 mg/dL (ref 1.7–2.4)

## 2024-04-05 LAB — TSH: TSH: 6.02 u[IU]/mL — ABNORMAL HIGH (ref 0.350–4.500)

## 2024-04-05 LAB — T4, FREE: Free T4: 1.36 ng/dL (ref 0.80–2.00)

## 2024-04-05 MED ORDER — ROSUVASTATIN CALCIUM 5 MG PO TABS
5.0000 mg | ORAL_TABLET | Freq: Every day | ORAL | Status: DC
  Administered 2024-04-05 – 2024-04-07 (×3): 5 mg via ORAL
  Filled 2024-04-05 (×3): qty 1

## 2024-04-05 MED ORDER — FUROSEMIDE 10 MG/ML IJ SOLN
40.0000 mg | Freq: Two times a day (BID) | INTRAMUSCULAR | Status: DC
  Administered 2024-04-05 – 2024-04-07 (×5): 40 mg via INTRAVENOUS
  Filled 2024-04-05 (×4): qty 4

## 2024-04-05 MED ORDER — TRAMADOL HCL 50 MG PO TABS
50.0000 mg | ORAL_TABLET | Freq: Once | ORAL | Status: AC
  Administered 2024-04-05: 50 mg via ORAL
  Filled 2024-04-05: qty 1

## 2024-04-05 MED ORDER — POTASSIUM CHLORIDE CRYS ER 20 MEQ PO TBCR
30.0000 meq | EXTENDED_RELEASE_TABLET | Freq: Two times a day (BID) | ORAL | Status: AC
  Administered 2024-04-05 (×2): 30 meq via ORAL
  Filled 2024-04-05 (×2): qty 1

## 2024-04-05 NOTE — Plan of Care (Signed)

## 2024-04-05 NOTE — Assessment & Plan Note (Addendum)
 Echocardiogram with reduced LV systolic function with EF 30 to 35%, mild LVH, diastolic parameters are indeterminate, RV systolic function preserved, LA with mild dilatation, no significant valvular disease. Trivial pericardial effusion.   Urine output 2,500 ml Systolic blood pressure 116 mmHg.   Plan to continue diuresis with furosemide  40 mg IV bid.  Adding RAAS inhibition with entresto  and B blocker with metoprolol  succinate 25 mg  Possible addition of mineralocorticoid receptor blocker prior to discharge.  No SGLT 2 inh due to chronic foley catheter and risk of urine infections

## 2024-04-05 NOTE — Hospital Course (Addendum)
 [name] was admitted to the hospital with the working diagnosis of heart failure exacerbation.   73 yo male with the past medical history of CLL, peripheral vascular disease, right lower extremity DVT, sp IV filter, BPH, supraventricular tachycardia, and heart block sp pacemaker who presented with dyspnea.  Reported worsening dyspnea for the last 4 weeks prior to admission. Because of severe symptoms on the day of admission EMS was called. He was found with increased work of breathing and was transported to the ED.  On his initial physical examination blood pressure 178/76, HR 104, RR 20 and 02 saturation 97% on supplemental 02 per [place] 2 L/min  Lungs with no wheezing or rhonchi, heart with S1 and S2 present and regular, abdomen with no distention and no lower extremity edema.  Na 130, K 4.2 Cl 92 bicarbonate 23 glucose 259 bun 17 cr 0,71  AST 23 ALT 21  Pro BNP 1,422 High sensitive troponin 39 and 54  Wbc 8,6 hgb 15.3 plt 658  Sars covid 19 negative Influenza negative RSV negative   Chest radiograph with cardiomegaly, bilateral hilar vascular congestion, cephalization of the vasculature, bilateral central interstitial infiltrates more at the lower lobes, bilateral pleural effusions, more right than left. Pacemaker defibrillator with one right atrial and one right ventricular lead.   CT Chest with bilateral ground glass opacities, more at bases with interlobular septal thickening, bilateral pleural effusions more right than left with compression atelectasis.  No pulmonary embolism.   EKG 107 bpm, left axis deviation, qtc 523, right bundle branch block, atrial sensing and ventricular pacing, with no significant ST segment or T wave changes.   Patient was placed on IV furosemide  for diuresis. Echocardiogram with reduced LV systolic function.   12/28 improved volume status, plan to continue medical therapy and close follow up as outpatient.

## 2024-04-05 NOTE — Assessment & Plan Note (Addendum)
 Continue entresto  and metoprolol  succinate  Continue blood pressure monitoring

## 2024-04-05 NOTE — Evaluation (Signed)
 Occupational Therapy Evaluation & Discharge Patient Details Name: Douglas [name] MRN: [id number] DOB: [birth year] Today's Date: 04/05/2024   History of Present Illness   Pt is a 73 y.o male admitted 12/25 from home with SOB, tachycardic in the ED. PMH: pacemaker, DM, BPH, HTN, PE, PVD, CLL, R AKA     Clinical Impressions Pt admitted based on above, and was seen based on problem list below. PTA pt was living at home with his wife who assists with ADLs at baseline. At baseline pt can transfer to w/c and tub bench using slideboard mod I. At baseline pt is total assist using a bed pan for toileting, and rolls in bed for wife to assist with LB dressing. Today pt is mod I for bed mobility, and lateral scoots along EOB. Pt reporting he is at baseline for ADLs, and mobility. OT educated pt on potential compensatory strategies to complete LB dressing and toilet transfers. Offered HHOT to assist, but pt politely declining, reporting his system works for him and his wife. Educated pt on elevating RUE to reduce edema, pt verbalized understanding. All education complete, no follow up OT needs. OT is signing off on this pt.    If plan is discharge home, recommend the following:   A little help with walking and/or transfers;A lot of help with bathing/dressing/bathroom;Assistance with cooking/housework     Functional Status Assessment   Patient has not had a recent decline in their functional status     Equipment Recommendations   None recommended by OT      Precautions/Restrictions   Precautions Precautions: Fall Recall of Precautions/Restrictions: Intact Restrictions Weight Bearing Restrictions Per Provider Order: No     Mobility Bed Mobility Overal bed mobility: Modified Independent     General bed mobility comments: HOB elevated    Transfers Overall transfer level: Modified independent     General transfer comment: Lateral scoots along EOB. Uses slideboard at baseline,  unable to transfer d/t lack of drop arm chair in room      Balance Overall balance assessment: Modified Independent (sitting balance)         ADL either performed or assessed with clinical judgement   ADL Overall ADL's : At baseline     General ADL Comments: Pt at baseline requires assist for LB ADLs and uses bedpan.     Vision Baseline Vision/History: 0 No visual deficits Patient Visual Report: No change from baseline Vision Assessment?: No apparent visual deficits            Pertinent Vitals/Pain Pain Assessment Pain Assessment: No/denies pain     Extremity/Trunk Assessment Upper Extremity Assessment Upper Extremity Assessment: Overall WFL for tasks assessed   Lower Extremity Assessment Lower Extremity Assessment: Defer to PT evaluation   Cervical / Trunk Assessment Cervical / Trunk Assessment: Normal   Communication Communication Communication: No apparent difficulties   Cognition Arousal: Alert Behavior During Therapy: WFL for tasks assessed/performed Cognition: No apparent impairments     Following commands: Intact       Cueing  General Comments   Cueing Techniques: Verbal cues  Received on 2L, sats at 98%, session conducted on RA sustaining 95% or greater. Left resting on RA, RN notified. Edema in RUE, encouraged elevation and vasoconstriction exercises to decrease           Home Living Family/patient expects to be discharged to:: Private residence Living Arrangements: Spouse/significant other Available Help at Discharge: Family;Available 24 hours/day Type of Home: House Home Access: Level entry  Home Layout: One level     Bathroom Shower/Tub: Chief Strategy Officer: Standard Bathroom Accessibility: Yes How Accessible: Accessible via wheelchair Home Equipment: Wheelchair - manual;Hospital bed;Tub bench;BSC/3in1          Prior Functioning/Environment Prior Level of Function : Needs assist       Mobility  Comments: pt reports mod I with slide board transfers ADLs Comments: Wife assists with LB ADLs, pt uses bed pan at baseline and has chronic foley, can slide board to tub bench    OT Problem List: Cardiopulmonary status limiting activity        OT Goals(Current goals can be found in the care plan section)   Acute Rehab OT Goals Patient Stated Goal: To go home OT Goal Formulation: All assessment and education complete, DC therapy Time For Goal Achievement: 04/19/24 Potential to Achieve Goals: Good   OT Frequency:       Co-evaluation PT/OT/SLP Co-Evaluation/Treatment: Yes Reason for Co-Treatment: For patient/therapist safety;To address functional/ADL transfers   OT goals addressed during session: ADL's and self-care      AM-PAC OT 6 Clicks Daily Activity     Outcome Measure Help from another person eating meals?: None Help from another person taking care of personal grooming?: A Little Help from another person toileting, which includes using toliet, bedpan, or urinal?: Total Help from another person bathing (including washing, rinsing, drying)?: A Lot Help from another person to put on and taking off regular upper body clothing?: A Little Help from another person to put on and taking off regular lower body clothing?: A Lot 6 Click Score: 15   End of Session Nurse Communication: Mobility status  Activity Tolerance: Patient tolerated treatment well Patient left: in bed;with call bell/phone within reach;with nursing/sitter in room  OT Visit Diagnosis: Muscle weakness (generalized) (M62.81)                Time: 9065-9043 OT Time Calculation (min): 22 min Charges:  OT General Charges $OT Visit: 1 Visit OT Evaluation $OT Eval Low Complexity: 1 Low  Adrianne BROCKS, OT  Acute Rehabilitation Services Office [phone] Secure chat preferred   Adrianne GORMAN Savers 04/05/2024, 10:14 AM

## 2024-04-05 NOTE — Assessment & Plan Note (Signed)
 Continue with hydroxyurea.

## 2024-04-05 NOTE — Progress Notes (Signed)
" ° °  Device was interrogated by [employer] rep.  Device function working normally.  Multiple detections of NSVT, this is not a new diagnosis, patient was asymptomatic.  Full report has been faxed to [facility].  Brief images attached below.  Waddell DELENA Donath, PA-C 04/05/2024 1:38 PM        "

## 2024-04-05 NOTE — Assessment & Plan Note (Addendum)
 Peripheral vascular disease, continue aspirin  and clopidogrel  Continue with statin therapy  Follow up with PT and Ot

## 2024-04-05 NOTE — Progress Notes (Addendum)
 "  Progress Note  Patient Name: Douglas [name] Date of Encounter: 04/05/2024 [facility] Cardiologist: None   Interval Summary   Patient in bed, getting echocardiogram done Reports feeling much better, with good urine output after receiving IV Lasix  Reports good overnight with some shortness of breath this AM Always feeling better post-Lasix   Will continue with IV diuresis today Pending echocardiogram  Device to be interrogated per [employer] rep  Vital Signs Vitals:   04/04/24 2337 04/05/24 0430 04/05/24 0437 04/05/24 0717  BP: (!) 153/78 131/76  (!) 148/71  Pulse: 91 91  87  Resp:  17  13  Temp: 98.4 F (36.9 C) 97.7 F (36.5 C)  98 F (36.7 C)  TempSrc: Oral Oral  Oral  SpO2: 94% 98%  98%  Weight:   105.8 kg   Height:        Intake/Output Summary (Last 24 hours) at 04/05/2024 0931 Last data filed at 04/05/2024 0400 Gross per 24 hour  Intake --  Output 2850 ml  Net -2850 ml      04/05/2024    4:37 AM 04/04/2024    4:21 PM 04/04/2024   11:01 AM  Last 3 Weights  Weight (lbs) 233 lb 4 oz 220 lb 0.3 oz 220 lb  Weight (kg) 105.8 kg 99.8 kg 99.791 kg     Telemetry/ECG  A-sensed, V-paced rhythm, frequent PVCs - Personally Reviewed  Physical Exam  GEN: No acute distress, on 2 L oxygen via [place].   Cardiac: RRR, no murmurs, rubs, or gallops.  Respiratory: unlabored breathing GI: Soft, nontender, non-distended  MS: 3+ pitting edema on left, s/p R AKA  Assessment & Plan   Acute on chronic HFrEF Hypertension  PTA meds: amlodipine  10 mg daily, valsartan -hydrochlorothiazide  320-25 mg daily Presented with shortness of breath, orthopnea, LE edema proBNP 1,422 Echo 2023 showed LVEF 35-40%  Started on IV Lasix , given 40 mg  2 doses Renal function normal  Hyponatremia improving   SBP 130-140s Reports good urine output and breathing much improved with Lasix   Out nearly 3 L yesterday  Pending updated echocardiogram -- further recs to follow Currently on IV  Lasix  40 gm BID, suspect he will require few more days of diuresis  Mild nonobstructive CAD  Mildly elevated troponin  39 ? 50  Suspect demand ischemia Already on DAPT for PV reasons  LDL 44 this AM on no therapy  Currently on PTA ASA 81 mg daily and Plavix  75 mg daily   Frequent PVCs High grade heart block s/p [employer] PPM Last remote interrogation in 01/2024 showed normal device function AS/VP Electrolytes normal, giving K this morning as 3.6 with IV diuresis  Will have device interrogated this admission -- reached out to [employer] rep Keep Mag > 2, K > 4 TSH elevated, will check T3/T4 -- defer treatment to primary   Per primary CLL History of PE Diabetes  PAD s/p AKA BPH  For questions or updates, please contact [facility] Please consult [email or web address] for contact info under   Signed, Waddell DELENA Donath, PA-C   ADDENDUM:   Patient seen and examined with TP.  I personally taken a history, examined the patient, reviewed relevant notes,  laboratory data / imaging studies.  I performed a substantive portion of this encounter and formulated the important aspects of the plan.  I agree with the APP's note, impression, and recommendations; however, I have edited the note to reflect changes or salient points.   Patient seen and examined at  bedside. Accompanied by his wife. Denies anginal chest pain. Shortness of breath significantly improving.   Prior to arrival patient endorsed orthopnea, PND, weight gain, dyspnea at rest  PHYSICAL EXAM: Today's Vitals   04/05/24 0717 04/05/24 0725 04/05/24 0932 04/05/24 1131  BP: (!) 148/71   121/65  Pulse: 87   93  Resp: 13   17  Temp: 98 F (36.7 C)   97.8 F (36.6 C)  TempSrc: Oral   Oral  SpO2: 98%   95%  Weight:      Height:      PainSc:  0-No pain 6  0-No pain   Body mass index is 31.63 kg/m (pended).   Net IO Since Admission: -3,760 mL [04/05/24 1459]  Filed Weights   04/04/24 1101 04/04/24 1621 04/05/24  0437  Weight: 99.8 kg (P) 99.8 kg 105.8 kg    General: Age-appropriate, hemodynamically stable, no acute distress HEENT: Trachea midline, positive JVP, normal cephalic atraumatic Lungs: Clear to auscultation bilaterally upper lung fields, decreased breath sounds at the bases, minimal rales, no wheezes or rhonchi's Heart: Regular, positive S1-S2, no murmurs rubs or gallops appreciated. Abdomen: Soft, nontender, nondistended, positive bowel sounds in all 4 quadrants Extremities: Right AKA, left lower extremity is warm and edematous Neuro: Nonfocal Psych: Appropriate and cooperative  EKG: (personally reviewed by me) 04/04/2024: A sensed V paced  Telemetry: (personally reviewed by me) Paced rhythm   Impression & Recommendations: :  Acute on chronic heart failure with reduced EF Stage C, NYHA class IIIb Echocardiogram 07/2021: LVEF 45-50% Echo 2023 35-40% Left heart catheterization October 2022: Nonobstructive disease Echocardiogram results pending.  If further reduction in LVEF, new regional wall motion abnormalities, may consider ischemic evaluation during this hospitalization.  Further recommendations to follow Net IO Since Admission: -3,760 mL [04/05/24 1505] Place Unna boots over the left lower extremity Continue IV diuretics Uptitrate GDMT once euvolemic. Plan of care discussed with patient and wife at bedside LDL 44 mg/dL and triglycerides 714 mg/dL, December 2025  History of DVT/PE with IVC filter in 2023. CT PE negative for pulmonary embolism  Nonobstructive coronary artery disease:  CT PE protocol notes four-vessel coronary artery calcification, severe aortic atherosclerosis Reemphasized importance of secondary prevention.   Already on dual antiplatelet therapy for his underlying PAD. Given his CAD, PAD LDL currently at goal will start low-dose Crestor  5 mg p.o. daily to hopefully augment his triglycerides and further optimize his lipids.  Aortic root dilatation: CT  chest PE protocol 04/04/2024: 42 mm Will need outpatient follow-up  PAD: Currently on antiplatelet therapy.  Recommend outpatient follow-up with vascular surgery  Hypertension: Uptitrate GDMT once diuresed  Cardiac pacemaker in situ: Interrogation pending  Further recommendations to follow as the case evolves.   This note was created using a voice recognition software as a result there may be grammatical errors inadvertently enclosed that do not reflect the nature of this encounter. Every attempt is made to correct such errors.   Madonna Michele HAS, [facility]  A Division of [facility] [address] [place], [place] [address]     "

## 2024-04-05 NOTE — Assessment & Plan Note (Signed)
Calculated BMI is 31.6

## 2024-04-05 NOTE — Progress Notes (Signed)
 Orthopedic Tech Progress Note Patient Details:  Douglas [name] 1950/12/09 [id number]  Ortho Devices Type of Ortho Device: Radio broadcast assistant Ortho Device/Splint Location: LLE Ortho Device/Splint Interventions: Ordered, Application   Post Interventions Patient Tolerated: Well  [name] 04/05/2024, 3:33 PM

## 2024-04-05 NOTE — Progress Notes (Signed)
 Initial Nutrition Assessment  DOCUMENTATION CODES:  Not applicable  INTERVENTION:  Recommend continue 2 gram sodium diet to aid in fluid retention/edema double protein portions with meals Heart Failure Nutrition Therapy handout added to AVS  NUTRITION DIAGNOSIS:  Increased nutrient needs related to acute illness as evidenced by estimated needs  GOAL:  Patient will meet greater than or equal to 90% of their needs  MONITOR:  PO intake, Supplement acceptance, Labs, Weight trends  REASON FOR ASSESSMENT:  Consult Other (Comment) (nutrition goals)  ASSESSMENT:  Pt presented with c/o progressive shortness of breath. Admitted with acute on chronic HFrEF. PMH significant for CLL on acalabrutinib , PVD, RLE DVT previously on Eliquis , SVT and heart block, pacemaker, essential HTN, thrombocytosis, right AKA.  RD working remotely. Attempted to reach via phone call to room.  Review of chart reflects pt has been primarily bed bound recently d/t shortness of breath. Question how decreased mobility has impacted nutritional status.    Meal completions: 12/26: 100% breakfast  Weight history reflects a 16 kg weight increase between 03/28/23-04/05/24. Question whether a degree of this weight change is r/t presence of edema though suspect a portion of this weight may be true weight gain given consistent increase within the last year.  + edema documented: moderate pitting BUE, LLE Cardiology documenting 3+ pitting edema to LLE  Medications: lasix  40mg  q12h, SSI 0-15 units TID, SSI 0-5 units at bedtime, klor-con  BID (repletion)  Labs:  Sodium 1333 Chloride 94 CBG's 147, 160 x24 hours HgbA1c 6.9%  UOP: x24 hours  NUTRITION - FOCUSED PHYSICAL EXAM: RD working remotely. Deferred to follow up.   Diet Order:   Diet Order             Diet heart healthy/carb modified Room service appropriate? Yes; Fluid consistency: Thin; Fluid restriction: 1200 mL Fluid  Diet effective now                    EDUCATION NEEDS:   No education needs have been identified at this time  Skin:  Skin Assessment: Reviewed RN Assessment  Last BM:  12/25  Height:   Ht Readings from Last 1 Encounters:  04/04/24 (P) 6' (1.829 m)    Weight:   Wt Readings from Last 1 Encounters:  04/05/24 105.8 kg   Ideal Body Weight:  72.8 kg (adjusted for right BKA)  BMI:  Body mass index is 31.63 kg/m (pended).  Estimated Nutritional Needs:   Kcal:  1900-2100  Protein:  100-115g  Fluid:  >/=1.9L  Royce Maris, RDN, LDN Clinical Nutrition See AMiON for contact information.

## 2024-04-05 NOTE — Progress Notes (Signed)
 " Progress Note   Patient: Douglas [name] FMW:[id number] DOB: [birth year] DOA: 04/04/2024     1 DOS: the patient was seen and examined on 04/05/2024   Brief hospital course: Mr. Yankee was admitted to the hospital with the working diagnosis of heart failure exacerbation.   73 yo male with the past medical history of CLL, peripheral vascular disease, right lower extremity DVT, sp IV filter, BPH, supraventricular tachycardia, and heart block sp pacemaker who presented with dyspnea.  Reported worsening dyspnea for the last 4 weeks prior to admission. Because of severe symptoms on the day of admission EMS was called. He was found with increased work of breathing and was transported to the ED.  On his initial physical examination blood pressure 178/76, HR 104, RR 20 and 02 saturation 97% on supplemental 02 per [place] 2 L/min  Lungs with no wheezing or rhonchi, heart with S1 and S2 present and regular, abdomen with no distention and no lower extremity edema.  Na 130, K 4.2 Cl 92 bicarbonate 23 glucose 259 bun 17 cr 0,71  AST 23 ALT 21  Pro BNP 1,422 High sensitive troponin 39 and 54  Wbc 8,6 hgb 15.3 plt 658  Sars covid 19 negative Influenza negative RSV negative   Chest radiograph with cardiomegaly, bilateral hilar vascular congestion, cephalization of the vasculature, bilateral central interstitial infiltrates more at the lower lobes, bilateral pleural effusions, more right than left. Pacemaker defibrillator with one right atrial and one right ventricular lead.   CT Chest with bilateral ground glass opacities, more at bases with interlobular septal thickening, bilateral pleural effusions more right than left with compression atelectasis.  No pulmonary embolism.   EKG 107 bpm, left axis deviation, qtc 523, right bundle branch block, atrial sensing and ventricular pacing, with no significant ST segment or T wave changes.   Assessment and Plan: * Acute on chronic systolic CHF (congestive heart  failure) (HCC) Echocardiogram with reduced LV systolic function with EF 30 to 35%, mild LVH, diastolic parameters are indeterminate, RV systolic function preserved, LA with mild dilatation, no significant valvular disease. Trivial pericardial effusion.   Urine output 2,850 ml Systolic blood pressure 140 mmHg.   Plan to continue diuresis with furosemide  40 mg IV bid.  Possible addition of afterload reduction with entresto.   Hypertension Continue blood pressure monitoring, possible addition of entresto during this hospitalization   Type 2 diabetes mellitus with hyperlipidemia (HCC) Continue glucose cover and monitoring with insulin  sliding scale.  Fasting glucose this am 145 mg/dl.  Continue with statin   CLL (chronic lymphocytic leukemia) (HCC) Currently stable and on treatment with acalabrutinib .  BPH (benign prostatic hyperplasia) Continue tamsulosin, no clinical signs of urinary retention   S/P AKA (above knee amputation) unilateral, right (HCC) Follow up with PT and Ot   History of pulmonary embolism Patient not on anticoagulation   Malnutrition of moderate degree Continue nutritional supplements.   Thrombocytosis Continue with hydroxyurea    Obesity, class 1 Calculated BMI is 31.6         Subjective: patient is feeling better, dyspnea and edema are improving but not yet back to baseline, no chest pain.   Physical Exam: Vitals:   04/05/24 0430 04/05/24 0437 04/05/24 0717 04/05/24 1131  BP: 131/76  (!) 148/71 121/65  Pulse: 91  87 93  Resp: 17  13 17   Temp: 97.7 F (36.5 C)  98 F (36.7 C) 97.8 F (36.6 C)  TempSrc: Oral  Oral Oral  SpO2: 98%  98% 95%  Weight:  105.8 kg    Height:       Neurology awake and alert ENT with mild pallor with no icterus Cardiovascular with S1 and S2 present and regular with no gallops, rubs or murmurs  Moderate JVD Respiratory with rales at bases bilaterally with no wheezing or rhonchi  Abdomen soft and non tender, not  distended Left lower extremity with pitting edema ++ Right AKA   Data Reviewed:    Family Communication: I spoke with patient's wife at the bedside, we talked in detail about patient's condition, plan of care and prognosis and all questions were addressed.   Disposition: Status is: Inpatient Remains inpatient appropriate because: IV diuresis   Planned Discharge Destination: Home     Author: Elidia Toribio Furnace, MD 04/05/2024 2:36 PM  For on call review [email or web address].  "

## 2024-04-05 NOTE — TOC CM/SW Note (Addendum)
 Transition of Care [facility]) - Inpatient Brief Assessment   Patient Details  Name: Douglas [name] MRN: [id number] Date of Birth: 1950-10-12  Transition of Care [facility]) CM/SW Contact:    Waddell Barnie Rama, RN Phone Number: 04/05/2024, 10:32 AM   Clinical Narrative: From home with spouse, has PCP and insurance on file, states has no HH services in place at this time , has hospital bed and w/chair at home.   Per wife at the bedside, he stays in bed most of the time.  He has been having issues lately with transferring from bed to w/chair from w/chair to car, he becomes SOB where he is unable to transfer,  wife uses a walker and is not able to assist him.  Patient feels like he is getting better with his SOB  since being in hospital, states lets see how much more he can improve before he goes home.   Once his SOB has improved he will be able to transfer like he was doing before.  States he will need ambulance transport at [employer] and family is support system, states gets medications from [facility].  Pta bed bound and w/chair bound.   Await pt eval.  He has a chronic foley which he goes to  [facility] every 30 days to change out foley.  Wife asked if the doctor could have them follow up with Cardiology at [facility] since this is closer to them.  NCM asked MD this question.   Transition of Care Asessment: Insurance and Status: Insurance coverage has been reviewed Patient has primary care physician: Yes Home environment has been reviewed: home with wife Prior level of function:: bed bound  most of time, has a w/chair Optometrist Home Services: Current home services (w/chair, hospital bed) Social Drivers of Health Review: SDOH reviewed no interventions necessary Readmission risk has been reviewed: Yes Transition of care needs: transition of care needs identified, TOC will continue to follow

## 2024-04-05 NOTE — Assessment & Plan Note (Signed)
Continue nutritional supplements. °

## 2024-04-05 NOTE — Discharge Instructions (Signed)

## 2024-04-05 NOTE — Assessment & Plan Note (Signed)
 Patient not on anticoagulation ?

## 2024-04-05 NOTE — Evaluation (Signed)
 Physical Therapy Evaluation and Discharge Patient Details Name: Douglas [name] MRN: [id number] DOB: 13-Mar-1951 Today's Date: 04/05/2024  History of Present Illness  Pt is a 73 y.o male admitted 12/25 from home with SOB, tachycardic in the ED. PMH: pacemaker, DM, BPH, HTN, PE, PVD, CLL, R AKA  Clinical Impression  Prior to admittance, pt was requiring physical assistance for rolling for toileting needs and was a mod I with slideboard transfers from hospital bed to wheelchair and wheelchair to car. Pt presents to evaluation near baseline level of function and reports he does not feel he needs any further acute care PT while admitted. Pt was encouraged to let his nurse know if he feels there has been a change in his mobility while admitted. No follow up therapies recommended at this time; PT signing off.         If plan is discharge home, recommend the following: A little help with walking and/or transfers;A little help with bathing/dressing/bathroom;Assistance with cooking/housework;Assist for transportation;Help with stairs or ramp for entrance   Can travel by private vehicle        Equipment Recommendations None recommended by PT  Recommendations for Other Services       Functional Status Assessment Patient has not had a recent decline in their functional status     Precautions / Restrictions Precautions Precautions: Fall Recall of Precautions/Restrictions: Intact Restrictions Weight Bearing Restrictions Per Provider Order: No      Mobility  Bed Mobility Overal bed mobility: Modified Independent             General bed mobility comments: HOB elevated    Transfers Overall transfer level: Modified independent Equipment used: None               General transfer comment: Pt performed lateral scoots to the L along EOB using BUE to assist. Increased time to complete.    Ambulation/Gait                  Stairs            Wheelchair Mobility      Tilt Bed    Modified Rankin (Stroke Patients Only)       Balance Overall balance assessment: Modified Independent (sitting EOB for ~5 mins w/ no LOBs or signs of instability)                                           Pertinent Vitals/Pain Pain Assessment Pain Assessment: No/denies pain    Home Living Family/patient expects to be discharged to:: Private residence Living Arrangements: Spouse/significant other Available Help at Discharge: Family;Available 24 hours/day Type of Home: House Home Access: Level entry       Home Layout: One level Home Equipment: Wheelchair - manual;Hospital bed;Tub bench;BSC/3in1      Prior Function Prior Level of Function : Needs assist             Mobility Comments: Per pt report, he is mod I with slideboard transfers to [facility] from hospital bed and from [facility] to car with physical assistance for rolling for bed pan placement for toileting ADLs Comments: Wife assists with LB ADLs, pt uses bed pan at baseline and has chronic foley, can slide board to tub bench     Extremity/Trunk Assessment   Upper Extremity Assessment Upper Extremity Assessment: Defer to OT evaluation    Lower Extremity Assessment  Lower Extremity Assessment: Overall WFL for tasks assessed    Cervical / Trunk Assessment Cervical / Trunk Assessment: Normal  Communication   Communication Communication: No apparent difficulties    Cognition Arousal: Alert Behavior During Therapy: WFL for tasks assessed/performed   PT - Cognitive impairments: No apparent impairments                         Following commands: Intact       Cueing Cueing Techniques: Verbal cues     General Comments General comments (skin integrity, edema, etc.): Initially on 2L with SpO2 98%. Trialed RA throughout session with SpO2 98%. Supine BP: 155/71. HR: 97 bpm    Exercises     Assessment/Plan    PT Assessment Patient does not need any further PT services   PT Problem List         PT Treatment Interventions      PT Goals (Current goals can be found in the Care Plan section)  Acute Rehab PT Goals Patient Stated Goal: to go home PT Goal Formulation: With patient/family Time For Goal Achievement: 04/19/24 Potential to Achieve Goals: Good    Frequency       Co-evaluation PT/OT/SLP Co-Evaluation/Treatment: Yes Reason for Co-Treatment: For patient/therapist safety;To address functional/ADL transfers PT goals addressed during session: Mobility/safety with mobility;Balance;Strengthening/ROM OT goals addressed during session: ADL's and self-care       AM-PAC PT 6 Clicks Mobility  Outcome Measure Help needed turning from your back to your side while in a flat bed without using bedrails?: A Little Help needed moving from lying on your back to sitting on the side of a flat bed without using bedrails?: A Little Help needed moving to and from a bed to a chair (including a wheelchair)?: Total Help needed standing up from a chair using your arms (e.g., wheelchair or bedside chair)?: Total Help needed to walk in hospital room?: Total Help needed climbing 3-5 steps with a railing? : Total 6 Click Score: 10    End of Session Equipment Utilized During Treatment: Oxygen (initially) Activity Tolerance: Patient tolerated treatment well Patient left: in bed;with call bell/phone within reach;with bed alarm set Nurse Communication: Mobility status PT Visit Diagnosis: Other abnormalities of gait and mobility (R26.89)    Time: 9065-9043 PT Time Calculation (min) (ACUTE ONLY): 22 min   Charges:   PT Evaluation $PT Eval Low Complexity: 1 Low   PT General Charges $$ ACUTE PT VISIT: 1 Visit         Leontine Hilt DPT Acute Rehab Services [phone] Prefer contact via chat   Leontine NOVAK Kris No 04/05/2024, 10:52 AM

## 2024-04-06 ENCOUNTER — Other Ambulatory Visit (HOSPITAL_COMMUNITY): Payer: Self-pay

## 2024-04-06 DIAGNOSIS — E66811 Obesity, class 1: Secondary | ICD-10-CM | POA: Diagnosis not present

## 2024-04-06 DIAGNOSIS — N401 Enlarged prostate with lower urinary tract symptoms: Secondary | ICD-10-CM | POA: Diagnosis not present

## 2024-04-06 DIAGNOSIS — Z950 Presence of cardiac pacemaker: Secondary | ICD-10-CM

## 2024-04-06 DIAGNOSIS — I10 Essential (primary) hypertension: Secondary | ICD-10-CM | POA: Diagnosis not present

## 2024-04-06 DIAGNOSIS — E1169 Type 2 diabetes mellitus with other specified complication: Secondary | ICD-10-CM | POA: Diagnosis not present

## 2024-04-06 DIAGNOSIS — D75839 Thrombocytosis, unspecified: Secondary | ICD-10-CM | POA: Diagnosis not present

## 2024-04-06 DIAGNOSIS — C9110 Chronic lymphocytic leukemia of B-cell type not having achieved remission: Secondary | ICD-10-CM | POA: Diagnosis not present

## 2024-04-06 DIAGNOSIS — Z89611 Acquired absence of right leg above knee: Secondary | ICD-10-CM | POA: Diagnosis not present

## 2024-04-06 DIAGNOSIS — E440 Moderate protein-calorie malnutrition: Secondary | ICD-10-CM | POA: Diagnosis not present

## 2024-04-06 DIAGNOSIS — E785 Hyperlipidemia, unspecified: Secondary | ICD-10-CM | POA: Diagnosis not present

## 2024-04-06 DIAGNOSIS — Z86711 Personal history of pulmonary embolism: Secondary | ICD-10-CM | POA: Diagnosis not present

## 2024-04-06 DIAGNOSIS — I5023 Acute on chronic systolic (congestive) heart failure: Secondary | ICD-10-CM | POA: Diagnosis not present

## 2024-04-06 LAB — GLUCOSE, CAPILLARY
Glucose-Capillary: 165 mg/dL — ABNORMAL HIGH (ref 70–99)
Glucose-Capillary: 164 mg/dL — ABNORMAL HIGH (ref 70–99)
Glucose-Capillary: 183 mg/dL — ABNORMAL HIGH (ref 70–99)
Glucose-Capillary: 164 mg/dL — ABNORMAL HIGH (ref 70–99)

## 2024-04-06 LAB — BASIC METABOLIC PANEL WITH GFR
Anion gap: 11 (ref 5–15)
BUN: 21 mg/dL (ref 8–23)
CO2: 27 mmol/L (ref 22–32)
Calcium: 9 mg/dL (ref 8.9–10.3)
Chloride: 93 mmol/L — ABNORMAL LOW (ref 98–111)
Creatinine, Ser: 0.82 mg/dL (ref 0.61–1.24)
GFR, Estimated: >60 mL/min
Glucose, Bld: 164 mg/dL — ABNORMAL HIGH (ref 70–99)
Potassium: 4.2 mmol/L (ref 3.5–5.1)
Sodium: 131 mmol/L — ABNORMAL LOW (ref 135–145)

## 2024-04-06 LAB — LACTIC ACID, PLASMA: Lactic Acid, Venous: 1.6 mmol/L (ref 0.5–1.9)

## 2024-04-06 LAB — T3: T3, Total: 125 ng/dL (ref 71–180)

## 2024-04-06 LAB — MAGNESIUM: Magnesium: 2 mg/dL (ref 1.7–2.4)

## 2024-04-06 MED ORDER — CHLORHEXIDINE GLUCONATE CLOTH 2 % EX PADS
6.0000 | MEDICATED_PAD | Freq: Every day | CUTANEOUS | Status: DC
  Administered 2024-04-06 – 2024-04-07 (×2): 6 via TOPICAL

## 2024-04-06 MED ORDER — METOPROLOL SUCCINATE ER 25 MG PO TB24
25.0000 mg | ORAL_TABLET | Freq: Every day | ORAL | Status: DC
  Administered 2024-04-06 – 2024-04-07 (×2): 25 mg via ORAL
  Filled 2024-04-06 (×2): qty 1

## 2024-04-06 MED ORDER — SACUBITRIL-VALSARTAN 24-26 MG PO TABS
1.0000 | ORAL_TABLET | Freq: Two times a day (BID) | ORAL | Status: DC
  Administered 2024-04-06 (×2): 1 via ORAL
  Filled 2024-04-06 (×2): qty 1

## 2024-04-06 NOTE — Plan of Care (Signed)

## 2024-04-06 NOTE — Progress Notes (Signed)
 " Progress Note   Patient: Douglas [name] FMW:[id number] DOB: May 20, 1950 DOA: 04/04/2024     2 DOS: the patient was seen and examined on 04/06/2024   Brief hospital course: Douglas [name] was admitted to the hospital with the working diagnosis of heart failure exacerbation.   73 yo male with the past medical history of CLL, peripheral vascular disease, right lower extremity DVT, sp IV filter, BPH, supraventricular tachycardia, and heart block sp pacemaker who presented with dyspnea.  Reported worsening dyspnea for the last 4 weeks prior to admission. Because of severe symptoms on the day of admission EMS was called. He was found with increased work of breathing and was transported to the ED.  On his initial physical examination blood pressure 178/76, HR 104, RR 20 and 02 saturation 97% on supplemental 02 per [place] 2 L/min  Lungs with no wheezing or rhonchi, heart with S1 and S2 present and regular, abdomen with no distention and no lower extremity edema.  Na 130, K 4.2 Cl 92 bicarbonate 23 glucose 259 bun 17 cr 0,71  AST 23 ALT 21  Pro BNP 1,422 High sensitive troponin 39 and 54  Wbc 8,6 hgb 15.3 plt 658  Sars covid 19 negative Influenza negative RSV negative   Chest radiograph with cardiomegaly, bilateral hilar vascular congestion, cephalization of the vasculature, bilateral central interstitial infiltrates more at the lower lobes, bilateral pleural effusions, more right than left. Pacemaker defibrillator with one right atrial and one right ventricular lead.   CT Chest with bilateral ground glass opacities, more at bases with interlobular septal thickening, bilateral pleural effusions more right than left with compression atelectasis.  No pulmonary embolism.   EKG 107 bpm, left axis deviation, qtc 523, right bundle branch block, atrial sensing and ventricular pacing, with no significant ST segment or T wave changes.   Assessment and Plan: * Acute on chronic systolic CHF (congestive heart  failure) (HCC) Echocardiogram with reduced LV systolic function with EF 30 to 35%, mild LVH, diastolic parameters are indeterminate, RV systolic function preserved, LA with mild dilatation, no significant valvular disease. Trivial pericardial effusion.   Urine output 2,500 ml Systolic blood pressure 116 mmHg.   Plan to continue diuresis with furosemide  40 mg IV bid.  Adding RAAS inhibition with entresto  and B blocker with metoprolol  succinate 25 mg  Possible addition of mineralocorticoid receptor blocker prior to discharge.  No SGLT 2 inh due to chronic foley catheter and risk of urine infections   Hypertension Continue entresto  and metoprolol  succinate  Continue blood pressure monitoring    Type 2 diabetes mellitus with hyperlipidemia (HCC) Continue glucose cover and monitoring with insulin  sliding scale.  Fasting glucose this am 164 mg/dl.  Continue with statin   CLL (chronic lymphocytic leukemia) (HCC) Currently stable and on treatment with acalabrutinib .  BPH (benign prostatic hyperplasia) Continue tamsulosin, continue foley catheter  Follows with Urology as outpatient   S/P AKA (above knee amputation) unilateral, right (HCC) Follow up with PT and Ot   History of pulmonary embolism Patient not on anticoagulation   Malnutrition of moderate degree Continue nutritional supplements.   Thrombocytosis Continue with hydroxyurea    Obesity, class 1 Calculated BMI is 31.6         Subjective: Patient with improvement in his symptoms, no chest pain, no PND or orthopnea, left lower extremity edema continue to improve   Physical Exam: Vitals:   04/05/24 2300 04/06/24 0300 04/06/24 0921 04/06/24 1128  BP: (!) 142/79 (!) 150/81 123/67 116/68  Pulse: 81  80 81 82  Resp: 14 19 (!) 21 17  Temp: 97.9 F (36.6 C) 97.8 F (36.6 C) 98.7 F (37.1 C) 98.4 F (36.9 C)  TempSrc: Oral Oral Oral Oral  SpO2: 97% 97% 98% 99%  Weight:  105 kg    Height:  6' (1.829 m)     Neurology  awake and alert ENT with mild pallor Cardiovascular with S1 and S2 present and regular with no gallops or rubs, no murmurs Mild JVD Respiratory with mild rales at bases with no wheezing, and no rhonchi  Left lower extremity with unna boot in place with trace edema Foley catheter in place (chronic foley)  Data Reviewed:    Family Communication: no family at the bedside   Disposition: Status is: Inpatient Remains inpatient appropriate because: heart failure recovering   Planned Discharge Destination: Home     Author: Elidia Toribio Furnace, MD 04/06/2024 1:55 PM  For on call review [email or web address].  "

## 2024-04-06 NOTE — Progress Notes (Signed)
 "   Progress Note  Patient Name: Douglas [name] Date of Encounter: 04/06/2024  Primary Cardiologist: Shelda Bruckner, MD  Subjective   SOB improved but not back to baseline. Nausea and belly pain present.  Inpatient Medications    Scheduled Meds:  acalabrutinib  maleate  100 mg Oral BID   aspirin  EC  81 mg Oral Daily   clopidogrel   75 mg Oral Daily   finasteride   5 mg Oral Daily   furosemide   40 mg Intravenous Q12H   gabapentin   300 mg Oral BID   heparin   5,000 Units Subcutaneous Q8H   hydroxyurea   1,500 mg Oral Daily   insulin  aspart  0-15 Units Subcutaneous TID WC   insulin  aspart  0-5 Units Subcutaneous QHS   metoprolol  succinate  25 mg Oral Daily   rosuvastatin   5 mg Oral Daily   sacubitril -valsartan   1 tablet Oral BID   sodium chloride  flush  3 mL Intravenous Q12H   traZODone   50 mg Oral QHS   Continuous Infusions:  PRN Meds: acetaminophen  **OR** acetaminophen , albuterol , hydrALAZINE , ondansetron  **OR** ondansetron  (ZOFRAN ) IV, polyethylene glycol, traZODone    Vital Signs    Vitals:   04/05/24 1600 04/05/24 1900 04/05/24 2300 04/06/24 0300  BP: 134/80 (!) 154/81 (!) 142/79 (!) 150/81  Pulse: 86 88 81 80  Resp: 16 15 14 19   Temp: 97.6 F (36.4 C) 97.7 F (36.5 C) 97.9 F (36.6 C) 97.8 F (36.6 C)  TempSrc: Oral Oral Oral Oral  SpO2: 97% 97% 97% 97%  Weight:    105 kg  Height:    6' (1.829 m)    Intake/Output Summary (Last 24 hours) at 04/06/2024 0920 Last data filed at 04/06/2024 0300 Gross per 24 hour  Intake --  Output 2500 ml  Net -2500 ml   Filed Weights   04/04/24 1621 04/05/24 0437 04/06/24 0300  Weight: (P) 99.8 kg 105.8 kg 105 kg    Telemetry     Personally reviewed.  Paced rhythm.  ECG    Not performed today.  Physical Exam   GEN: No acute distress.   Neck: JVD+ Cardiac: RRR, no murmur, rub, or gallop.  Respiratory: Nonlabored. Clear to auscultation bilaterally. GI: Soft, nontender, bowel sounds present. MS: No  edema; RLL amputation. Neuro:  Nonfocal. Psych: Alert and oriented x 3. Normal affect.  Labs    Chemistry Recent Labs  Lab 04/04/24 1104 04/04/24 1125 04/05/24 0336  NA 130*  --  133*  K 4.2  --  3.6  CL 92*  --  94*  CO2 23  --  27  GLUCOSE 259*  --  145*  BUN 17  --  18  CREATININE 0.71  --  0.81  CALCIUM  9.4  --  8.9  PROT  --  6.4* 5.9*  ALBUMIN   --  4.2 3.8  AST  --  23 29  ALT  --  21 18  ALKPHOS  --  65 54  BILITOT  --  0.4 0.4  GFRNONAA >60  --  >60  ANIONGAP 15  --  13     Hematology Recent Labs  Lab 04/04/24 1104 04/05/24 0336  WBC 8.6 7.3  RBC 3.86* 3.62*  HGB 15.3 14.2  HCT 43.2 40.1  MCV 111.9* 110.8*  MCH 39.6* 39.2*  MCHC 35.4 35.4  RDW 17.3* 17.1*  PLT 658* 551*    Cardiac EnzymesNo results for input(s): TROPONINIHS in the last 720 hours.  BNP Recent Labs  Lab 04/04/24 1125  PROBNP  1,422.0*     DDimerNo results for input(s): DDIMER in the last 168 hours.   Assessment & Plan   Acute on chronic systolic and diastolic heart failure Nonischemic cardiomyopathy with LVEF 30 to 35%, no ICD - 2.5L with net -2.2 L on IV Lasix  40 mg twice daily.  SOB improving.  Continue IV Lasix  40 mg twice daily.  Anticipate transitioning to p.o. Lasix  tomorrow.  Add lactic acid due to nausea, abd pain. - Echocardiogram this admission showed LVEF 30 to 35%, normal RV function, moderate left pleural effusion, no valvular heart disease and CVP 8 mmHg. Previously, echo in 2023 showed LVEF 35 to 40%, later improved to 45 to 50%. - Start metoprolol  succinate 25 mg once daily. - Start Entresto  24-26 mg twice daily. 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 - No indication for ischemia evaluation at this time. - Labs pending today.  High-grade AV block, s/p Medtronic dual-chamber PPM - Multiple NSVT runs on interrogation this admission.  Starting beta-blocker, as above. - Due to new onset reduced LV function, he will need new ICD  leads implanted if LVEF continues to be reduced despite GDMT versus CRT-D if underlying rhythm shows LBBB.  Nonobstructive CAD in 2022 - No angina.  [name] in 2022 showed nonobstructive CAD, mid LAD 20% stenosis.  No need to repeat cath, not ACS.  PAD - On DAPT.  Follows up with vascular surgery.  History of DVT/PE with IVC filter in 2023 - Negative for PE.  30 minutes spent in reviewing prior medical records, reports, more than 3 labs, discussion and documentation.   Signed, Diannah SHAUNNA [place]wood, MD  04/06/2024, 9:20 AM    "

## 2024-04-07 ENCOUNTER — Other Ambulatory Visit (HOSPITAL_COMMUNITY): Payer: Self-pay

## 2024-04-07 DIAGNOSIS — N401 Enlarged prostate with lower urinary tract symptoms: Secondary | ICD-10-CM | POA: Diagnosis not present

## 2024-04-07 DIAGNOSIS — Z89611 Acquired absence of right leg above knee: Secondary | ICD-10-CM | POA: Diagnosis not present

## 2024-04-07 DIAGNOSIS — C9110 Chronic lymphocytic leukemia of B-cell type not having achieved remission: Secondary | ICD-10-CM | POA: Diagnosis not present

## 2024-04-07 DIAGNOSIS — I4430 Unspecified atrioventricular block: Secondary | ICD-10-CM | POA: Diagnosis not present

## 2024-04-07 DIAGNOSIS — Z86711 Personal history of pulmonary embolism: Secondary | ICD-10-CM | POA: Diagnosis not present

## 2024-04-07 DIAGNOSIS — E871 Hypo-osmolality and hyponatremia: Secondary | ICD-10-CM

## 2024-04-07 DIAGNOSIS — D75839 Thrombocytosis, unspecified: Secondary | ICD-10-CM | POA: Diagnosis not present

## 2024-04-07 DIAGNOSIS — I10 Essential (primary) hypertension: Secondary | ICD-10-CM | POA: Diagnosis not present

## 2024-04-07 DIAGNOSIS — I5023 Acute on chronic systolic (congestive) heart failure: Secondary | ICD-10-CM | POA: Diagnosis not present

## 2024-04-07 DIAGNOSIS — E440 Moderate protein-calorie malnutrition: Secondary | ICD-10-CM | POA: Diagnosis not present

## 2024-04-07 DIAGNOSIS — E1169 Type 2 diabetes mellitus with other specified complication: Secondary | ICD-10-CM | POA: Diagnosis not present

## 2024-04-07 DIAGNOSIS — E66811 Obesity, class 1: Secondary | ICD-10-CM | POA: Diagnosis not present

## 2024-04-07 LAB — BASIC METABOLIC PANEL WITH GFR
Anion gap: 9 (ref 5–15)
BUN: 24 mg/dL — ABNORMAL HIGH (ref 8–23)
CO2: 30 mmol/L (ref 22–32)
Calcium: 8.7 mg/dL — ABNORMAL LOW (ref 8.9–10.3)
Chloride: 98 mmol/L (ref 98–111)
Creatinine, Ser: 0.81 mg/dL (ref 0.61–1.24)
GFR, Estimated: >60 mL/min
Glucose, Bld: 146 mg/dL — ABNORMAL HIGH (ref 70–99)
Potassium: 3.7 mmol/L (ref 3.5–5.1)
Sodium: 137 mmol/L (ref 135–145)

## 2024-04-07 LAB — GLUCOSE, CAPILLARY
Glucose-Capillary: 161 mg/dL — ABNORMAL HIGH (ref 70–99)
Glucose-Capillary: 136 mg/dL — ABNORMAL HIGH (ref 70–99)

## 2024-04-07 LAB — MAGNESIUM: Magnesium: 2 mg/dL (ref 1.7–2.4)

## 2024-04-07 MED ORDER — SACUBITRIL-VALSARTAN 49-51 MG PO TABS
1.0000 | ORAL_TABLET | Freq: Two times a day (BID) | ORAL | Status: DC
  Administered 2024-04-07: 1 via ORAL
  Filled 2024-04-07: qty 1

## 2024-04-07 MED ORDER — POTASSIUM CHLORIDE CRYS ER 20 MEQ PO TBCR: 20.0000 meq | EXTENDED_RELEASE_TABLET | Freq: Every day | ORAL | Status: DC

## 2024-04-07 MED ORDER — FUROSEMIDE 80 MG PO TABS
80.0000 mg | ORAL_TABLET | Freq: Every day | ORAL | 0 refills | Status: DC
  Filled 2024-04-07: qty 30, 30d supply, fill #0

## 2024-04-07 MED ORDER — ROSUVASTATIN CALCIUM 5 MG PO TABS
5.0000 mg | ORAL_TABLET | Freq: Every day | ORAL | 0 refills | Status: DC
  Filled 2024-04-07: qty 30, 30d supply, fill #0

## 2024-04-07 MED ORDER — SACUBITRIL-VALSARTAN 49-51 MG PO TABS
1.0000 | ORAL_TABLET | Freq: Two times a day (BID) | ORAL | 0 refills | Status: DC
  Filled 2024-04-07: qty 60, 30d supply, fill #0

## 2024-04-07 MED ORDER — POTASSIUM CHLORIDE CRYS ER 20 MEQ PO TBCR
40.0000 meq | EXTENDED_RELEASE_TABLET | Freq: Once | ORAL | Status: AC
  Administered 2024-04-07: 40 meq via ORAL
  Filled 2024-04-07: qty 2

## 2024-04-07 MED ORDER — POTASSIUM CHLORIDE CRYS ER 20 MEQ PO TBCR
20.0000 meq | EXTENDED_RELEASE_TABLET | Freq: Every day | ORAL | 0 refills | Status: DC
  Filled 2024-04-07: qty 30, 30d supply, fill #0

## 2024-04-07 MED ORDER — FUROSEMIDE 40 MG PO TABS
80.0000 mg | ORAL_TABLET | Freq: Every day | ORAL | Status: DC
  Administered 2024-04-07: 80 mg via ORAL
  Filled 2024-04-07: qty 2

## 2024-04-07 MED ORDER — METOPROLOL SUCCINATE ER 25 MG PO TB24
25.0000 mg | ORAL_TABLET | Freq: Every day | ORAL | 0 refills | Status: AC
  Filled 2024-04-07: qty 30, 30d supply, fill #0

## 2024-04-07 NOTE — Discharge Summary (Signed)
 " Physician Discharge Summary   Patient: Douglas [name] MRN: [id number] DOB: September 16, 1950  Admit date:     04/04/2024  Discharge date: 04/07/2024  Discharge Physician: Elidia Sieving Kazuko Clemence   PCP: Kip Righter, MD   Recommendations at discharge:    Patient has been placed on guideline directed medical therapy with entersto and metoprolol  succinate, possible addition of mineralocorticoid receptor blocker as outpatient. No SGLT 2 inh due to chronic foley catheter Diuresis with furosemide  80 mg po daily, added K supplements to avoid hypokalemia.  Follow up renal function and electrolytes as outpatient in 7 days Follow up with Dr Kip in 7 to 10 days Follow up with Cardiology as scheduled   Discharge Diagnoses: Principal Problem:   Acute on chronic systolic CHF (congestive heart failure) (HCC) Active Problems:   Hypertension   Type 2 diabetes mellitus with hyperlipidemia (HCC)   CLL (chronic lymphocytic leukemia) (HCC)   BPH (benign prostatic hyperplasia)   S/P AKA (above knee amputation) unilateral, right (HCC)   History of pulmonary embolism   Malnutrition of moderate degree   Thrombocytosis   Obesity, class 1  Resolved Problems:   * No resolved hospital problems. [facility] Course: Douglas [name] was admitted to the hospital with the working diagnosis of heart failure exacerbation.   73 yo male with the past medical history of CLL, peripheral vascular disease, right lower extremity DVT, sp IV filter, BPH, supraventricular tachycardia, and heart block sp pacemaker who presented with dyspnea.  Reported worsening dyspnea for the last 4 weeks prior to admission. Because of severe symptoms on the day of admission EMS was called. He was found with increased work of breathing and was transported to the ED.  On his initial physical examination blood pressure 178/76, HR 104, RR 20 and 02 saturation 97% on supplemental 02 per [place] 2 L/min  Lungs with no wheezing or rhonchi, heart with S1  and S2 present and regular, abdomen with no distention and no lower extremity edema.  Na 130, K 4.2 Cl 92 bicarbonate 23 glucose 259 bun 17 cr 0,71  AST 23 ALT 21  Pro BNP 1,422 High sensitive troponin 39 and 54  Wbc 8,6 hgb 15.3 plt 658  Sars covid 19 negative Influenza negative RSV negative   Chest radiograph with cardiomegaly, bilateral hilar vascular congestion, cephalization of the vasculature, bilateral central interstitial infiltrates more at the lower lobes, bilateral pleural effusions, more right than left. Pacemaker defibrillator with one right atrial and one right ventricular lead.   CT Chest with bilateral ground glass opacities, more at bases with interlobular septal thickening, bilateral pleural effusions more right than left with compression atelectasis.  No pulmonary embolism.   EKG 107 bpm, left axis deviation, qtc 523, right bundle branch block, atrial sensing and ventricular pacing, with no significant ST segment or T wave changes.   Patient was placed on IV furosemide  for diuresis. Echocardiogram with reduced LV systolic function.   12/28 improved volume status, plan to continue medical therapy and close follow up as outpatient.   Assessment and Plan: * Acute on chronic systolic CHF (congestive heart failure) (HCC) Echocardiogram with reduced LV systolic function with EF 30 to 35%, mild LVH, diastolic parameters are indeterminate, RV systolic function preserved, LA with mild dilatation, no significant valvular disease. Trivial pericardial effusion.   Patient was placed on IV furosemide  for diuresis, negative fluid balance was achieved, - 6,384 ml, with significant improvement in his symptoms.   Continue medical therapy with entresto  and B blocker  with metoprolol  succinate 25 mg  Possible addition of mineralocorticoid receptor as outpatient.  No SGLT 2 inh due to chronic foley catheter and risk of urine infections  Loop diuretic with furosemide  80 mg po daily.    Patient had a cardiac catheterization in 2022 with no obstructive coronary artery disease.   Hypertension Continue entresto  and metoprolol  succinate   AV block High grade AV block, sp dual chamber pacemaker  Multiple NSVT on interrogation on admission.  Patient was placed on metoprolol  with good toleration.  If continue low EF may need to upgrade CRT D.   CLL (chronic lymphocytic leukemia) (HCC) Currently stable and on treatment with acalabrutinib . Continue with hydroxyurea .   Hyponatremia Renal function has remained stable, at the time of his discharge his serum Na is 137, K is 3,7 and serum bicarbonate at 30  Mg 2,0   Plan to continue diuresis with furosemide  80 mg daily and will add daily Kcl supplementation 20 meq Follow up renal function and electrolytes as outpatient   S/P AKA (above knee amputation) unilateral, right (HCC) Peripheral vascular disease, continue aspirin  and clopidogrel  Continue with statin therapy  Follow up with PT and Ot   History of pulmonary embolism Patient not on anticoagulation   Malnutrition of moderate degree Continue nutritional supplements.   Thrombocytosis Continue with hydroxyurea    BPH (benign prostatic hyperplasia) Continue tamsulosin, continue foley catheter  Follows with Urology as outpatient   Obesity, class 1 Calculated BMI is 31.6   Type 2 diabetes mellitus with hyperlipidemia (HCC) Patient was placed on insulin  sliding scale for glucose cover and monitoring.   His glucose remained stable.   Continue with statin        Consultants: Cardiology  Procedures performed: none   Disposition: Home Diet recommendation:  Cardiac and Carb modified diet DISCHARGE MEDICATION: Allergies as of 04/07/2024   No Known Allergies      Medication List     STOP taking these medications    amLODipine  10 MG tablet Commonly known as: NORVASC    valsartan -hydrochlorothiazide  320-25 MG tablet Commonly known as: DIOVAN -HCT        TAKE these medications    acetaminophen  500 MG tablet Commonly known as: TYLENOL  Take 500 mg by mouth daily as needed for headache or mild pain (pain score 1-3).   albuterol  (2.5 MG/3ML) 0.083% nebulizer solution Commonly known as: PROVENTIL  Take 2.5 mg by nebulization every 6 (six) hours as needed for wheezing or shortness of breath.   albuterol  108 (90 Base) MCG/ACT inhaler Commonly known as: VENTOLIN  HFA Inhale 2 puffs into the lungs every 6 (six) hours as needed.   ALEVE ARTHRITIS PAIN EX Apply 1 tablet topically daily as needed (pain).   aspirin  EC 81 MG tablet Take 81 mg by mouth daily. Swallow whole.   bisacodyl  5 MG EC tablet Commonly known as: DULCOLAX Take 5 mg by mouth daily as needed for moderate constipation.   Calquence  100 MG tablet Generic drug: acalabrutinib  maleate Take 1 tablet (100 mg) by mouth 2 (two) times daily.   clopidogrel  75 MG tablet Commonly known as: PLAVIX  Take 75 mg by mouth daily.   docusate sodium  100 MG capsule Commonly known as: COLACE Take 1 capsule (100 mg total) by mouth 2 (two) times daily. What changed:  when to take this reasons to take this   finasteride  5 MG tablet Commonly known as: PROSCAR  Take 1 tablet (5 mg total) by mouth daily.   furosemide  80 MG tablet Commonly known as: LASIX  Take  1 tablet (80 mg total) by mouth daily.   gabapentin  300 MG capsule Commonly known as: NEURONTIN  Take 300 mg by mouth 2 (two) times daily.   hydroxyurea  500 MG capsule Commonly known as: HYDREA  Take 3 capsules (1,500 mg total) by mouth daily.   melatonin 5 MG Tabs Take 1 tablet (5 mg total) by mouth at bedtime.   metFORMIN 500 MG tablet Commonly known as: GLUCOPHAGE Take 500 mg by mouth 2 (two) times daily.   metoprolol  succinate 25 MG 24 hr tablet Commonly known as: TOPROL -XL Take 1 tablet (25 mg total) by mouth daily. Start taking on: April 08, 2024   MUCINEX  NASAL SPRAY FULL FORCE NA Place 1 spray into the  nose 2 (two) times daily as needed (congestion).   potassium chloride  SA 20 MEQ tablet Commonly known as: KLOR-CON  M Take 1 tablet (20 mEq total) by mouth daily. Start taking on: April 08, 2024   rosuvastatin  5 MG tablet Commonly known as: CRESTOR  Take 1 tablet (5 mg total) by mouth daily. Start taking on: April 08, 2024   sacubitril -valsartan  49-51 MG Commonly known as: ENTRESTO  Take 1 tablet by mouth 2 (two) times daily.   traZODone  50 MG tablet Commonly known as: DESYREL  Take 50 mg by mouth at bedtime.        Discharge Exam: Filed Weights   04/05/24 0437 04/06/24 0300 04/07/24 0149  Weight: 105.8 kg 105 kg 103.7 kg   BP 130/76 (BP Location: Left Arm)   Pulse 89   Temp 98 F (36.7 C) (Oral)   Resp 15   Ht 6' (1.829 m)   Wt 103.7 kg   SpO2 94%   BMI 31.01 kg/m   Patient is feeling better, no chest pain and no dyspnea, no PND, orthopnea or lower extremity edema  Neurology awake and alert ENT with mild pallor with no icterus Cardiovascular with S1 and S2 present and regular with no gallops, rubs or murmurs Respiratory with no rales or wheezing, no rhonchi Abdomen with no distention, soft and non tender No lower extremity edema on the left, right above the knee amputation  Chronic foley catheter in place   Condition at discharge: stable  The results of significant diagnostics from this hospitalization (including imaging, microbiology, ancillary and laboratory) are listed below for reference.   Imaging Studies: ECHOCARDIOGRAM COMPLETE Result Date: 04/05/2024    ECHOCARDIOGRAM REPORT   Patient Name:   EMMANUAL GAUTHREAUX Date of Exam: 04/05/2024 Medical Rec #:  [id number]        Height:       72.0 in Accession #:    [id number]       Weight:       233.2 lb Date of Birth:  10-27-1950         BSA:          2.274 m Patient Age:    73 years         BP:           148/71 mmHg Patient Gender: M                HR:           93 bpm. Exam Location:  Inpatient Procedure: 2D  Echo, Cardiac Doppler and Color Doppler (Both Spectral and Color            Flow Doppler were utilized during procedure). Indications:    CHF- Acute Systolic  History:        Patient has prior  history of Echocardiogram examinations, most                 recent 08/26/2021. Risk Factors:Hypertension and Diabetes.  Sonographer:    Sherlean Dubin Referring Phys: [id number] EKTA V PATEL  Sonographer Comments: Image acquisition challenging due to patient body habitus and Image acquisition challenging due to respiratory motion. IMPRESSIONS  1. Left ventricular ejection fraction, by estimation, is 30 to 35%. The left ventricle has moderately decreased function. Left ventricular endocardial border not optimally defined to evaluate regional wall motion. There is mild left ventricular hypertrophy. Left ventricular diastolic parameters are indeterminate.  2. Right ventricular systolic function is normal. The right ventricular size is normal. Tricuspid regurgitation signal is inadequate for assessing PA pressure.  3. Left atrial size was mildly dilated.  4. Moderate pleural effusion in the left lateral region.  5. The mitral valve is grossly normal. No evidence of mitral valve regurgitation. No evidence of mitral stenosis.  6. The aortic valve is grossly normal. There is mild calcification of the aortic valve. Aortic valve regurgitation is not visualized. Aortic valve sclerosis/calcification is present, without any evidence of aortic stenosis.  7. The inferior vena cava is normal in size with <50% respiratory variability, suggesting right atrial pressure of 8 mmHg. Conclusion(s)/Recommendation(s): Consider Definity contrast for follow up echocardiograms, wall motion and EF are poorly visualized. FINDINGS  Left Ventricle: Left ventricular ejection fraction, by estimation, is 30 to 35%. The left ventricle has moderately decreased function. Left ventricular endocardial border not optimally defined to evaluate regional wall motion. The  left ventricular internal cavity size was normal in size. There is mild left ventricular hypertrophy. Left ventricular diastolic parameters are indeterminate. Right Ventricle: The right ventricular size is normal. Right vetricular wall thickness was not well visualized. Right ventricular systolic function is normal. Tricuspid regurgitation signal is inadequate for assessing PA pressure. The tricuspid regurgitant velocity is 1.21 m/s, and with an assumed right atrial pressure of 8 mmHg, the estimated right ventricular systolic pressure is 13.9 mmHg. Left Atrium: Left atrial size was mildly dilated. Right Atrium: Right atrial size was normal in size. Pericardium: Trivial pericardial effusion is present. Mitral Valve: The mitral valve is grossly normal. No evidence of mitral valve regurgitation. No evidence of mitral valve stenosis. Tricuspid Valve: The tricuspid valve is not well visualized. Tricuspid valve regurgitation is trivial. No evidence of tricuspid stenosis. Aortic Valve: The aortic valve is grossly normal. There is mild calcification of the aortic valve. Aortic valve regurgitation is not visualized. Aortic valve sclerosis/calcification is present, without any evidence of aortic stenosis. Aortic valve mean gradient measures 5.5 mmHg. Aortic valve peak gradient measures 9.8 mmHg. Aortic valve area, by VTI measures 3.25 cm. Pulmonic Valve: The pulmonic valve was normal in structure. Pulmonic valve regurgitation is not visualized. No evidence of pulmonic stenosis. Aorta: The aortic root is normal in size and structure and the ascending aorta was not well visualized. Venous: The inferior vena cava is normal in size with less than 50% respiratory variability, suggesting right atrial pressure of 8 mmHg. IAS/Shunts: No atrial level shunt detected by color flow Doppler. Additional Comments: A is visualized in the right atrium and right ventricle. There is a moderate pleural effusion in the left lateral region.  LEFT  VENTRICLE PLAX 2D LVIDd:         5.50 cm   Diastology LVIDs:         4.86 cm   LV e' medial:    5.44 cm/s LV PW:  1.00 cm   LV E/e' medial:  13.2 LV IVS:        1.30 cm   LV e' lateral:   5.92 cm/s LVOT diam:     2.30 cm   LV E/e' lateral: 12.1 LV SV:         80 LV SV Index:   35 LVOT Area:     4.15 cm  RIGHT VENTRICLE            IVC RV Basal diam:  3.80 cm    IVC diam: 1.70 cm RV Mid diam:    3.40 cm RV S prime:     6.95 cm/s TAPSE (M-mode): 1.9 cm LEFT ATRIUM             Index        RIGHT ATRIUM           Index LA diam:        3.80 cm 1.67 cm/m   RA Area:     17.30 cm LA Vol (A2C):   68.5 ml 30.12 ml/m  RA Volume:   47.20 ml  20.75 ml/m LA Vol (A4C):   71.2 ml 31.30 ml/m LA Biplane Vol: 71.3 ml 31.35 ml/m  AORTIC VALVE AV Area (Vmax):    2.69 cm AV Area (Vmean):   2.64 cm AV Area (VTI):     3.25 cm AV Vmax:           156.50 cm/s AV Vmean:          105.550 cm/s AV VTI:            0.245 m AV Peak Grad:      9.8 mmHg AV Mean Grad:      5.5 mmHg LVOT Vmax:         101.19 cm/s LVOT Vmean:        67.136 cm/s LVOT VTI:          0.191 m LVOT/AV VTI ratio: 0.78  AORTA Ao Root diam: 3.70 cm Ao Asc diam:  3.40 cm MITRAL VALVE               TRICUSPID VALVE MV Area (PHT): 4.49 cm    TR Peak grad:   5.9 mmHg MV Decel Time: 169 msec    TR Vmax:        121.00 cm/s MV E velocity: 71.70 cm/s MV A velocity: 37.70 cm/s  SHUNTS MV E/A ratio:  1.90        Systemic VTI:  0.19 m                            Systemic Diam: 2.30 cm Soyla Merck MD Electronically signed by Soyla Merck MD Signature Date/Time: 04/05/2024/12:48:34 PM    Final    CT Angio Chest PE W and/or Wo Contrast Result Date: 04/04/2024 EXAM: CTA CHEST 04/04/2024 01:24:00 PM TECHNIQUE: CTA of the chest was performed without and with the administration of 75 mL of iohexol  (OMNIPAQUE ) 350 MG/ML injection. Multiplanar reformatted images are provided for review. MIP images are provided for review. Automated exposure control, iterative  reconstruction, and/or weight based adjustment of the mA/kV was utilized to reduce the radiation dose to as low as reasonably achievable. COMPARISON: Chest x-ray 04/04/2024. CLINICAL HISTORY: Pulmonary embolism (PE) suspected, high prob. FINDINGS: PULMONARY ARTERIES: Pulmonary arteries are adequately opacified for evaluation. No acute pulmonary embolus. Main pulmonary artery is normal in caliber. MEDIASTINUM: The heart demonstrates 4-vessel coronary  artery calcification and mild interlateral wall thickening. Aortic root enlargement measuring up to 4.2 cm. The ascending thoracic aorta is otherwise normal in caliber. The descending thoracic aorta is normal in caliber. Severe atherosclerotic plaque of the thoracic aorta. The pericardium demonstrates no acute abnormality. LYMPH NODES: No mediastinal, hilar or axillary lymphadenopathy. LUNGS AND PLEURA: Bilateral lower lobe passive atelectasis. Diffuse bronchial wall thickening. Expiratory phase respiration. Small left and moderate right pleural effusions. No pneumothorax. UPPER ABDOMEN: Calcified gallstones within the gallbladder lumen. Gallbladder partially visualized. SOFT TISSUES AND BONES: Severe deformity of the lateral glenohumeral joints. Left chest wall dual-lead pacemaker. No acute soft tissue abnormality. IMPRESSION: 1. No pulmonary embolus. 2. Mild pulmonary edema with associated small left and moderate right pleural effusions with bilateral lower lobe passive atelectasis. 3. Aortic root enlargement measuring up to 4.2 cm, with the remainder of the thoracic aorta normal in caliber and severe atherosclerotic plaque. Electronically signed by: Morgane Naveau MD 04/04/2024 01:33 PM EST RP Workstation: [id number]   DG Chest 2 View Result Date: 04/04/2024 CLINICAL DATA:  Shortness of breath. EXAM: CHEST - 2 VIEW COMPARISON:  08/26/2021 FINDINGS: The cardio pericardial silhouette is enlarged. Bibasilar atelectasis/infiltrate noted. Tiny bilateral pleural  effusions suspected. Dual lead left-sided permanent pacemaker again noted. No acute bony abnormality. Degenerative changes evident in both shoulders. IMPRESSION: Bibasilar atelectasis/infiltrate with tiny bilateral pleural effusions. Electronically Signed   By: Camellia Candle M.D.   On: 04/04/2024 11:59    Microbiology: Results for orders placed or performed during the hospital encounter of 04/04/24  Resp panel by RT-PCR (RSV, Flu A&B, Covid) Anterior Nasal Swab     Status: None   Collection Time: 04/04/24 11:04 AM   Specimen: Anterior Nasal Swab  Result Value Ref Range Status   SARS Coronavirus 2 by RT PCR NEGATIVE NEGATIVE Final   Influenza A by PCR NEGATIVE NEGATIVE Final   Influenza B by PCR NEGATIVE NEGATIVE Final    Comment: (NOTE) The Xpert Xpress SARS-CoV-2/FLU/RSV plus assay is intended as an aid in the diagnosis of influenza from Nasopharyngeal swab specimens and should not be used as a sole basis for treatment. Nasal washings and aspirates are unacceptable for Xpert Xpress SARS-CoV-2/FLU/RSV testing.  Fact Sheet for Patients: bloggercourse.com  Fact Sheet for Healthcare Providers: seriousbroker.it  This test is not yet approved or cleared by the [place]  FDA and has been authorized for detection and/or diagnosis of SARS-CoV-2 by FDA under an Emergency Use Authorization (EUA). This EUA will remain in effect (meaning this test can be used) for the duration of the COVID-19 declaration under Section 564(b)(1) of the Act, 21 U.S.C. section 360bbb-3(b)(1), unless the authorization is terminated or revoked.     Resp Syncytial Virus by PCR NEGATIVE NEGATIVE Final    Comment: (NOTE) Fact Sheet for Patients: bloggercourse.com  Fact Sheet for Healthcare Providers: seriousbroker.it  This test is not yet approved or cleared by the [place]  FDA and has been authorized for  detection and/or diagnosis of SARS-CoV-2 by FDA under an Emergency Use Authorization (EUA). This EUA will remain in effect (meaning this test can be used) for the duration of the COVID-19 declaration under Section 564(b)(1) of the Act, 21 U.S.C. section 360bbb-3(b)(1), unless the authorization is terminated or revoked.  Performed at [facility], [address], [place], [place] [address]     Labs: CBC: Recent Labs  Lab 04/04/24 1104 04/05/24 0336  WBC 8.6 7.3  NEUTROABS  --  5.4  HGB 15.3 14.2  HCT 43.2  40.1  MCV 111.9* 110.8*  PLT 658* 551*   Basic Metabolic Panel: Recent Labs  Lab 04/04/24 1104 04/04/24 1939 04/05/24 0336 04/06/24 1044 04/07/24 0254  NA 130*  --  133* 131* 137  K 4.2  --  3.6 4.2 3.7  CL 92*  --  94* 93* 98  CO2 23  --  27 27 30   GLUCOSE 259*  --  145* 164* 146*  BUN 17  --  18 21 24*  CREATININE 0.71  --  0.81 0.82 0.81  CALCIUM  9.4  --  8.9 9.0 8.7*  MG  --  1.5* 2.3 2.0 2.0   Liver Function Tests: Recent Labs  Lab 04/04/24 1125 04/05/24 0336  AST 23 29  ALT 21 18  ALKPHOS 65 54  BILITOT 0.4 0.4  PROT 6.4* 5.9*  ALBUMIN  4.2 3.8   CBG: Recent Labs  Lab 04/06/24 0602 04/06/24 1125 04/06/24 1610 04/06/24 2012 04/07/24 0607  GLUCAP 165* 164* 183* 164* 161*    Discharge time spent: greater than 30 minutes.  Signed: Elidia Toribio Furnace, MD [facility] 04/07/2024 "

## 2024-04-07 NOTE — Progress Notes (Signed)
 "   Progress Note  Patient Name: Douglas [name] Date of Encounter: 04/07/2024  Primary Cardiologist: Shelda Bruckner, MD  Subjective   SOB resolved.  Inpatient Medications    Scheduled Meds:  acalabrutinib  maleate  100 mg Oral BID   aspirin  EC  81 mg Oral Daily   Chlorhexidine  Gluconate Cloth  6 each Topical Daily   clopidogrel   75 mg Oral Daily   finasteride   5 mg Oral Daily   furosemide   40 mg Intravenous Q12H   gabapentin   300 mg Oral BID   heparin   5,000 Units Subcutaneous Q8H   hydroxyurea   1,500 mg Oral Daily   insulin  aspart  0-15 Units Subcutaneous TID WC   insulin  aspart  0-5 Units Subcutaneous QHS   metoprolol  succinate  25 mg Oral Daily   potassium chloride   40 mEq Oral Once   rosuvastatin   5 mg Oral Daily   sacubitril -valsartan   1 tablet Oral BID   sodium chloride  flush  3 mL Intravenous Q12H   traZODone   50 mg Oral QHS   Continuous Infusions:  PRN Meds: acetaminophen  **OR** acetaminophen , albuterol , ondansetron  **OR** ondansetron  (ZOFRAN ) IV, polyethylene glycol, traZODone    Vital Signs    Vitals:   04/07/24 0108 04/07/24 0149 04/07/24 0338 04/07/24 0727  BP: 112/66  115/64 126/70  Pulse:    82  Resp: 18  18 (!) 22  Temp: 98.2 F (36.8 C)  98.1 F (36.7 C) 98 F (36.7 C)  TempSrc: Oral  Oral Oral  SpO2:    94%  Weight:  103.7 kg    Height:        Intake/Output Summary (Last 24 hours) at 04/07/2024 0857 Last data filed at 04/07/2024 0438 Gross per 24 hour  Intake 723 ml  Output 2000 ml  Net -1277 ml   Filed Weights   04/05/24 0437 04/06/24 0300 04/07/24 0149  Weight: 105.8 kg 105 kg 103.7 kg    Telemetry     Personally reviewed.  Paced rhythm.  ECG    Not performed today.  Physical Exam   GEN: No acute distress.   Neck: JVD Cardiac: RRR, no murmur, rub, or gallop.  Respiratory: Nonlabored. Clear to auscultation bilaterally. GI: Soft, nontender, bowel sounds present. MS: 1+ pitting edema in the LLE; RLL  amputation. Neuro:  Nonfocal. Psych: Alert and oriented x 3. Normal affect.  Labs    Chemistry Recent Labs  Lab 04/04/24 1125 04/05/24 0336 04/06/24 1044 04/07/24 0254  NA  --  133* 131* 137  K  --  3.6 4.2 3.7  CL  --  94* 93* 98  CO2  --  27 27 30   GLUCOSE  --  145* 164* 146*  BUN  --  18 21 24*  CREATININE  --  0.81 0.82 0.81  CALCIUM   --  8.9 9.0 8.7*  PROT 6.4* 5.9*  --   --   ALBUMIN  4.2 3.8  --   --   AST 23 29  --   --   ALT 21 18  --   --   ALKPHOS 65 54  --   --   BILITOT 0.4 0.4  --   --   GFRNONAA  --  >60 >60 >60  ANIONGAP  --  13 11 9      Hematology Recent Labs  Lab 04/04/24 1104 04/05/24 0336  WBC 8.6 7.3  RBC 3.86* 3.62*  HGB 15.3 14.2  HCT 43.2 40.1  MCV 111.9* 110.8*  MCH 39.6* 39.2*  MCHC 35.4 35.4  RDW 17.3* 17.1*  PLT 658* 551*    Cardiac EnzymesNo results for input(s): TROPONINIHS in the last 720 hours.  BNP Recent Labs  Lab 04/04/24 1125  PROBNP 1,422.0*     DDimerNo results for input(s): DDIMER in the last 168 hours.   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  SOB resolved.  Switch IV diuretics to p.o. Lasix  80 mg once daily.  Lactic acid was obtained yesterday due to nausea, abdominal pain, it was 1.6, within normal limits. - Echocardiogram this admission showed LVEF 30 to 35%, normal RV function, moderate left pleural effusion, no valvular heart disease and CVP 8 mmHg. Previously, echo in 2023 showed LVEF 35 to 40%, later improved to 45 to 50%. - Continue metoprolol  succinate 25 mg once daily. - Increase Entresto  from 24-26 mg to 49-51 mg twice daily. 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 - No indication for ischemia evaluation at this time. - Labs pending today.  High-grade AV block, s/p Medtronic dual-chamber PPM -  Multiple NSVT runs on interrogation this admission.  Starting beta-blocker, as above. - Due to new onset reduced LV function, he will need new ICD leads implanted if LVEF continues to be reduced despite GDMT versus CRT-D if underlying rhythm shows LBBB.  Nonobstructive CAD in 2022 - No angina.  [name] in 2022 showed nonobstructive CAD, mid LAD 20% stenosis.  No need to repeat cath, not ACS.  PAD - On DAPT.  Follows up with vascular surgery.  History of DVT/PE with IVC filter in 2023 - Negative for PE.  [facility] will sign off.   Medication Recommendations: Continue current medications. Other recommendations (labs, testing, etc): None Follow up as an outpatient: Scheduled follow-up in 1 to 2 weeks with Dr. Dawna Bruckner.  Patient [facility].   30 minutes spent in reviewing prior medical records, reports, more than 3 labs, discussion and documentation.   Signed, Diannah SHAUNNA Maywood, MD  04/07/2024, 8:57 AM    "

## 2024-04-07 NOTE — Assessment & Plan Note (Signed)
 Renal function has remained stable, at the time of his discharge his serum Na is 137, K is 3,7 and serum bicarbonate at 30  Mg 2,0   Plan to continue diuresis with furosemide  80 mg daily and will add daily Kcl supplementation 20 meq Follow up renal function and electrolytes as outpatient

## 2024-04-07 NOTE — TOC Transition Note (Signed)
 Transition of Care [facility]) - Discharge Note   Patient Details  Name: Douglas [name] MRN: [id number] Date of Birth: 1951-01-15  [facility] CM/SW Contact:  Marval Gell, RN Phone Number: 04/07/2024, 11:38 AM   Clinical Narrative:     Notified by nurse that patient would need PTAR for transport home. Spoke w patient's wife and she confirmed address and states she is ready to receive patient at anytime at home. PTAR forms at estate agent and nurse informed. PTAR called.         Patient Goals and CMS Choice            Discharge Placement                       Discharge Plan and Services Additional resources added to the After Visit Summary for                                       Social Drivers of Health (SDOH) Interventions SDOH Screenings   Food Insecurity: No Food Insecurity (04/04/2024)  Housing: Low Risk (04/04/2024)  Transportation Needs: No Transportation Needs (04/04/2024)  Utilities: Not At Risk (04/04/2024)  Depression (PHQ2-9): Low Risk (02/12/2024)  Social Connections: Patient Declined (04/04/2024)  Tobacco Use: Medium Risk (04/04/2024)     Readmission Risk Interventions    04/05/2024   10:31 AM 08/10/2021   10:44 AM 07/23/2021   11:22 AM  Readmission Risk Prevention Plan  Medication Screening Complete    Transportation Screening Complete Complete Complete  PCP or Specialist Appt within 3-5 Days   Complete  HRI or Home Care Consult   Complete  Social Work Consult for Recovery Care Planning/Counseling   Complete  Palliative Care Screening   Not Applicable  Medication Review Oceanographer)  Complete Complete  PCP or Specialist appointment within 3-5 days of discharge  Complete   HRI or Home Care Consult  Complete   SW Recovery Care/Counseling Consult  Complete   Palliative Care Screening  Not Applicable   Skilled Nursing Facility  Not Applicable

## 2024-04-07 NOTE — Progress Notes (Signed)
 "  Nursing Discharge Note   Name: Douglas [name] MRN: [id number] DOB: 05-Nov-1950    Admit Date:  04/04/2024  Discharge Date:  04/07/2024   Douglas [name] is to be discharged home per MD order.  AVS completed. Reviewed with patient and family at bedside. Highlighted copy provided for patient to take home.  Patient/caregiver able to verbalize understanding of discharge instructions. PIV removed. Patient stable upon discharge.  Chronic foley remains in place.  TOC Pharmacy filled prescription medications obtained from [facility] by unit staff and provided to patient at bedside prior to discharge.     Discharge Instructions      Heart Failure Nutrition Therapy  This nutrition therapy will help you feel better and support your heart.  This plan focuses on: Limiting sodium in your diet. Salt (sodium) makes your body hold water . When your body holds too much water , you can feel shortness of breath and swelling. You can prevent these symptoms by eating less salt. Limiting fluid in your diet. For some patients, drinking too much fluid can make heart failure worse. It can cause symptoms such as shortness of breath and swelling. Limiting fluids can help relieve some of your symptoms. Managing your weight. Your registered dietitian nutritionist (RDN) can help you choose a healthy weight for your body type. You can achieve these goals by: Reading food labels to keep track of how much sodium is in the foods you eat. Limiting foods that are high in sodium. Checking your weight to make sure youre not retaining too much fluid. Reading the Food Label: How Much Sodium Is Too Much? The nutrition plan for heart failure usually limits the sodium you get from food and drinks to 2,000 milligrams per day. Salt is the main source of sodium. Read the nutrition label to find out how much sodium is in 1 serving of a food. Select foods with 140 milligrams of sodium or less per serving. Foods with more  than 300 milligrams of sodium per serving may not fit into a reduced-sodium meal plan. Check serving sizes. If you eat more than 1 serving, you will get more sodium than the amount listed. Cutting Back on Sodium Avoid processed foods. Eat more fresh foods. Fresh and frozen fruits and vegetables without added juices or sauces are naturally low in sodium. Fresh meats are lower in sodium than processed meats, such as bacon, sausage, and hot dogs. Read the nutrition label or ask your butcher to help you find a fresh meat that is low in sodium. Eat less salt, at the table and when cooking. Just 1 teaspoon of table salt has 2,300 milligrams of sodium. Leave the salt out of recipes for pasta, casseroles, and soups. Ask your RDN how to cook your favorite recipes without sodium. Be a engineer, building services. Look for food packages that say salt-free or sodium-free. These items contain less than 5 milligrams of sodium per serving. Very-low-sodium products contain less than 35 milligrams of sodium per serving. Low-sodium products contain less than 140 milligrams of sodium per serving. Unsalted or no added salt products may still be high in sodium. Check the nutrition label. Add flavors to your food without adding sodium. Try lemon juice, lime juice, fruit juice, or vinegar. Dry or fresh herbs add flavor. Try basil, bay leaf, dill, rosemary, parsley, sage, dry mustard, nutmeg, thyme, and paprika. Pepper, red pepper flakes, and cayenne pepper can add spice to your meals without adding sodium. Hot sauce contains sodium, but if you use just a drop  or two, it will not add up to much. Buy a sodium-free seasoning blend or make your own at home. Use caution when you eat outside your home. Restaurant foods can be very high in sodium. Ask for nutrition information. Many restaurants provide nutrition facts on their menus or websites. Let your server know that you want your food to be cooked without salt. Ask for  your salad dressing and sauces to come on the side. Fluid Restriction Your doctor may ask you to follow a fluid restriction in addition to taking diuretics (water  pills). Ask your doctor how much fluid you can have. Foods that are liquid at room temperature are considered a fluid, such as popsicles, soup, ice cream, and Jell-O. Here are some common conversions that will help you measure your fluid intake every day: 1,000 milliliters = 1 liter or 4 cups 1 fluid ounce = 30 milliliters  1 cup = 240 milliliters 2,000 milliliters = 2 liters or 8 cups  1,500 milliliters = 1 liters or 6 cups    Weight Monitoring Weigh yourself each day. Sudden weight gain is a sign that fluid is building up in your body. Follow these guidelines: Weigh yourself every morning. If you gain 3 or more pounds in 1-2 days or 5 or more pounds within 1 week, call your doctor. Your doctor may adjust your medicine to get rid of the extra fluid. Talk with your doctor or RDN about what a healthy weight is for you. Talk with your doctor to find out what type of physical activity is best for you.  Foods Recommended Food Group Recommended Foods  Grains Bread with less than 80 milligrams sodium per slice (yeast breads usually have less sodium than those made with baking soda) Homemade bread made with reduced-sodium baking soda Many cold cereals, especially shredded wheat and puffed rice Oats, grits, or cream of wheat Dry pastas, noodles, quinoa, and rice  Vegetables Fresh and frozen vegetables without added sauces, salt, or sodium Homemade soups (salt free or low sodium) Low-sodium or sodium-free canned vegetables and soups  Fruits Fresh and canned fruits Dried fruits, such as raisins, cranberries, and prunes  Dairy (Milk and Milk Products) Milk or milk powder Rice milk and soy milk Yogurt, including Greek yogurt Small amounts of natural, block cheese or reduced-sodium cheese (Swiss, ricotta, and fresh mozzarella are lower in  sodium than others) Regular or soft cream cheese and low-sodium cottage cheese  Protein Foods (Meat, Poultry, Fish, [employer] Logistics/support/administrative Officer) [employer] and fish Turkey bacon (except if packaged in a sodium solution) Canned or packed tuna (no more than 4 ounces at 1 serving) Dried beans and peas; edamame (fresh soybeans) Eggs or egg beaters (if  less than 200 mg per serving) Unsalted nuts or peanut butter  Desserts and Snacks Fresh fruit or applesauce Angel food cake Granola bars Unsalted pretzels, popcorn, or nuts Pudding or gelatin with whipped cream topping Homemade rice-crispy treats Vanilla wafers Frozen fruit bars  Fats Tub or liquid margarine Unsaturated fat oils (canola, olive, corn, sunflower, safflower, peanut)  Condiments Fresh or dried herbs; low-sodium ketchup; vinegar; lemon or lime juice; pepper; salt-free seasoning mixes and marinades (salt-free seasoning blend); simple salad dressings (vinegar and oil); salt-free sauces   Foods Not Recommended Food Group Foods Not Recommended  Grains Breads or crackers topped with salt Cereals (hot/cold) with more than 300 milligrams sodium per serving Biscuits, cornbread, and other quick breads prepared with baking soda Prepackaged bread crumbs Self-rising flours  Vegetables Canned vegetables (unless they  are salt free or low sodium) Frozen vegetables with seasoning and sauces Sauerkraut and pickled vegetables Canned or dried soups (unless they are salt free or low  sodium) French fries and onion rings  Fruits Dried fruits preserved with sodium-containing additives  Dairy (Milk and Milk Products) Buttermilk Processed cheeses  Cottage cheese (unless a low-sodium variety) Feta cheese; shredded cheese (has more sodium than block cheese); singles slices and string cheese  Protein Foods (Meat, Poultry, Fish, Beans) Cured meats: bacon, ham, sausage, pepperoni, and hot dogs Canned meats: chili, Vienna sausage, sardines, and ham Smoked fish and  meats Frozen meals that have more than 600 milligrams sodium  Fats Salted butter or margarine  Condiments Salt, sea salt, kosher salt, onion salt, and garlic salt Seasoning mixes containing salt (Lemon Pepper or Bouillon cubes) Catsup or ketchup, BBQ sauce, Worcestershire and soy sauce Salsa, pickles, olives, relish Salad dressings: ranch, blue cheese, Italian, and French   Alcohol Check with your doctor.   Heart Failure Sample 1-Day Menu View Nutrient Info Breakfast 1 cup regular oatmeal made with water  or milk 1 cup reduced-fat (2%) milk 1 medium banana 1 slice whole wheat bread 1 tablespoon salt-free peanut butter  Morning Snack 1/2 cup dried cranberries  Lunch 3 ounces grilled chicken breast 1 cup salad greens Olive oil and vinegar dressing (for greens) 5 unsalted or low-sodium crackers Fruit plate with 1/4 cup strawberries 1/2 sliced orange (for fruit plate) 1 peach half (for fruit plate)  Afternoon Snack 1 ounce low-sodium turkey 1 piece whole wheat bread  Evening Meal 3 ounces herb-baked fish 1 baked potato 2 teaspoons soft margarine (trans fat-free) (for potato) Sliced tomatoes 1/2 cup steamed spinach drizzled with lemon juice 3-inch square of angel food cake Fresh strawberries (2) (for cake)  Evening Snack 2 tablespoons salt-free peanut butter 5 low-sodium crackers  Daily Sum Nutrient Unit Value  Macronutrients  Energy kcal 1890  Energy kJ 7906  Protein g 95  Total lipid (fat) g 56  Carbohydrate, by difference g 270  Fiber, total dietary g 31  Sugars, total g 99  Minerals  Calcium , Ca mg 949  Iron, Fe mg 27  Sodium, Na mg 1538  Vitamins  Vitamin C, total ascorbic acid  mg 118  Vitamin A, IU IU 18639  Vitamin D IU 232  Lipids  Fatty acids, total saturated g 13  Fatty acids, total monounsaturated g 22  Fatty acids, total polyunsaturated g 16  Cholesterol mg 126     Heart Failure Vegan Sample 1-Day Menu View Nutrient Info Breakfast 1 cup oatmeal   cup walnuts 1 banana 1 cup soymilk fortified with calcium , vitamin B12, and vitamin D  Lunch 1 large whole wheat pita Salad made with: 1 cup chickpeas 1 cup lettuce  cup cherry tomatoes 1 cup strawberries 1 tablespoon olive oil 1 tablespoon balsamic vinegar  Evening Meal  cup tofu 2 teaspoons olive oil Pinch garlic powder 1 baked potato 1 tablespoon margarine, soft, tub  cup cooked spinach with: Squeeze of lemon 1 cup soymilk fortified with calcium , vitamin B12, and vitamin D  Evening Snack 1 tablespoon peanut butter, without salt  ounce pretzels, without salt  Daily Sum Nutrient Unit Value  Macronutrients  Energy kcal 1848  Energy kJ 7735  Protein g 74  Total lipid (fat) g 83  Carbohydrate, by difference g 223  Fiber, total dietary g 39  Sugars, total g 44  Minerals  Calcium , Ca mg 1325  Iron, Fe mg 20  Sodium, Na mg  1098  Vitamins  Vitamin C, total ascorbic acid  mg 140  Vitamin A, IU IU 15807  Vitamin D IU 238  Lipids  Fatty acids, total saturated g 13  Fatty acids, total monounsaturated g 33  Fatty acids, total polyunsaturated g 32  Cholesterol mg 0     Heart Failure Vegetarian (Lacto-Ovo) Sample 1-Day Menu View Nutrient Info Breakfast 1 cup oatmeal  cup walnuts 1 banana 1 cup fat-free milk  Lunch 1 large whole wheat pita Salad made with:  cup chickpeas 1 ounce mozzarella cheese 1 cup lettuce  cup cherry tomatoes 1 cup strawberries 1 tablespoon olive oil 1 tablespoon balsamic vinegar  Evening Meal  cup tofu 2 teaspoons olive oil Pinch garlic powder 1 baked potato 1 tablespoon margarine, soft, tub  cup cooked spinach with: Squeeze of lemon 1 cup fat-free milk  Evening Snack 1 tablespoon peanut butter, without salt 1 apple  Daily Sum Nutrient Unit Value  Macronutrients  Energy kcal 1847  Energy kJ 7734  Protein g 76  Total lipid (fat) g 79  Carbohydrate, by difference g 231  Fiber, total dietary g 35  Sugars, total g 83  Minerals   Calcium , Ca mg 1488  Iron, Fe mg 16  Sodium, Na mg 1100  Vitamins  Vitamin C, total ascorbic acid  mg 149  Vitamin A, IU IU 16116  Vitamin D IU 234  Lipids  Fatty acids, total saturated g 15  Fatty acids, total monounsaturated g 32  Fatty acids, total polyunsaturated g 26  Cholesterol mg 28    Copyright 2020  Academy of Nutrition and Dietetics. All rights reserved       Discharge Instructions     Discharge instructions   Complete by: As directed    Please follow up with primary care in 7 to 10 days, follow up with Cardiology as scheduled   Increase activity slowly   Complete by: As directed         Allergies as of 04/07/2024   No Known Allergies      Medication List     STOP taking these medications    amLODipine  10 MG tablet Commonly known as: NORVASC    valsartan -hydrochlorothiazide  320-25 MG tablet Commonly known as: DIOVAN -HCT       TAKE these medications    acetaminophen  500 MG tablet Commonly known as: TYLENOL  Take 500 mg by mouth daily as needed for headache or mild pain (pain score 1-3).   albuterol  (2.5 MG/3ML) 0.083% nebulizer solution Commonly known as: PROVENTIL  Take 2.5 mg by nebulization every 6 (six) hours as needed for wheezing or shortness of breath.   albuterol  108 (90 Base) MCG/ACT inhaler Commonly known as: VENTOLIN  HFA Inhale 2 puffs into the lungs every 6 (six) hours as needed.   ALEVE ARTHRITIS PAIN EX Apply 1 tablet topically daily as needed (pain).   aspirin  EC 81 MG tablet Take 81 mg by mouth daily. Swallow whole.   bisacodyl  5 MG EC tablet Commonly known as: DULCOLAX Take 5 mg by mouth daily as needed for moderate constipation.   Calquence  100 MG tablet Generic drug: acalabrutinib  maleate Take 1 tablet (100 mg) by mouth 2 (two) times daily.   clopidogrel  75 MG tablet Commonly known as: PLAVIX  Take 75 mg by mouth daily.   docusate sodium  100 MG capsule Commonly known as: COLACE Take 1 capsule (100 mg total)  by mouth 2 (two) times daily. What changed:  when to take this reasons to take this   Entresto  49-51 MG  Generic drug: sacubitril -valsartan  Take 1 tablet by mouth 2 (two) times daily.   finasteride  5 MG tablet Commonly known as: PROSCAR  Take 1 tablet (5 mg total) by mouth daily.   furosemide  80 MG tablet Commonly known as: LASIX  Take 1 tablet (80 mg total) by mouth daily.   gabapentin  300 MG capsule Commonly known as: NEURONTIN  Take 300 mg by mouth 2 (two) times daily.   hydroxyurea  500 MG capsule Commonly known as: HYDREA  Take 3 capsules (1,500 mg total) by mouth daily.   melatonin 5 MG Tabs Take 1 tablet (5 mg total) by mouth at bedtime.   metFORMIN 500 MG tablet Commonly known as: GLUCOPHAGE Take 500 mg by mouth 2 (two) times daily.   metoprolol  succinate 25 MG 24 hr tablet Commonly known as: TOPROL -XL Take 1 tablet (25 mg total) by mouth daily. Start taking on: April 08, 2024   MUCINEX  NASAL SPRAY FULL FORCE NA Place 1 spray into the nose 2 (two) times daily as needed (congestion).   potassium chloride  SA 20 MEQ tablet Commonly known as: KLOR-CON  M Take 1 tablet (20 mEq total) by mouth daily. Start taking on: April 08, 2024   rosuvastatin  5 MG tablet Commonly known as: CRESTOR  Take 1 tablet (5 mg total) by mouth daily. Start taking on: April 08, 2024   traZODone  50 MG tablet Commonly known as: DESYREL  Take 50 mg by mouth at bedtime.         Discharge Instructions/ Education: An After Visit Summary was printed and given to the patient. Discharge instructions given to patient/family with verbalized understanding. Discharge education completed with patient/family including: follow up instructions, medication list, discharge activities, and limitations if indicated.  Additional discharge instructions as indicated by discharging provider also reviewed.  Patient and family able to verbalize understanding, all questions fully answered. Patient  instructed to return to Emergency Department, call 911, or call MD for any changes in condition.   Patient transported by [facility] for transport home. Handoff/report given to [facility] ambulance staff.   Wife at bedside wheeled to lobby by security staff to obtain private automobile.  "

## 2024-04-07 NOTE — Assessment & Plan Note (Signed)
 High grade AV block, sp dual chamber pacemaker  Multiple NSVT on interrogation on admission.  Patient was placed on metoprolol  with good toleration.  If continue low EF may need to upgrade CRT D.

## 2024-04-07 NOTE — Progress Notes (Signed)
 Patient discharge order noted. Case Manager/ SW assigned to patient notified via Epic Secure Chat that per patient and wife, he will need ambulance transport home.   Care order obtained to remove unaboot on LLE  for discharge home. Unaboot removed at this time.

## 2024-04-08 ENCOUNTER — Telehealth: Payer: Self-pay | Admitting: *Deleted

## 2024-04-08 ENCOUNTER — Other Ambulatory Visit (HOSPITAL_BASED_OUTPATIENT_CLINIC_OR_DEPARTMENT_OTHER): Payer: Self-pay

## 2024-04-08 ENCOUNTER — Telehealth: Payer: Self-pay | Admitting: Oncology

## 2024-04-08 ENCOUNTER — Telehealth: Payer: Self-pay | Admitting: Cardiology

## 2024-04-08 DIAGNOSIS — I2602 Saddle embolus of pulmonary artery with acute cor pulmonale: Secondary | ICD-10-CM

## 2024-04-08 MED ORDER — HYDROXYUREA 500 MG PO CAPS
1500.0000 mg | ORAL_CAPSULE | Freq: Every day | ORAL | 0 refills | Status: AC
  Filled 2024-04-08: qty 90, 30d supply, fill #0

## 2024-04-08 NOTE — Telephone Encounter (Signed)
 Mrs. Kroon called to report Douglas [name] was in hospital 12/25 to 12/28 with heart failure. Wanted Dr. Cloretta aware. Wants to reschedule his 12/31 lab/OV to end of January due to too weak to get out of house at that time. Asking if MD will order a month supply of the hydrea  till he is seen. OK per Dr. Cloretta. Scheduling message sent for reschedule.

## 2024-04-08 NOTE — Telephone Encounter (Signed)
 Pt wife requesting appt be a few weeks out but does not want to wait until March.

## 2024-04-08 NOTE — Telephone Encounter (Signed)
 Patient is r/s'd with Douglas [name] for 04/23/23 and he is okay with that.

## 2024-04-09 ENCOUNTER — Telehealth: Payer: Self-pay | Admitting: Oncology

## 2024-04-09 NOTE — Telephone Encounter (Signed)
 Patient has been scheduled for follow-up visit per 04/08/2024 LOS.  Pt noted appt details on personal planner/calendar.

## 2024-04-10 ENCOUNTER — Other Ambulatory Visit: Payer: Self-pay

## 2024-04-10 ENCOUNTER — Inpatient Hospital Stay

## 2024-04-10 ENCOUNTER — Inpatient Hospital Stay: Admitting: Oncology

## 2024-04-12 ENCOUNTER — Ambulatory Visit (HOSPITAL_BASED_OUTPATIENT_CLINIC_OR_DEPARTMENT_OTHER): Admitting: Cardiology

## 2024-04-16 ENCOUNTER — Other Ambulatory Visit: Payer: Self-pay

## 2024-04-16 ENCOUNTER — Ambulatory Visit: Payer: 59

## 2024-04-16 DIAGNOSIS — I441 Atrioventricular block, second degree: Secondary | ICD-10-CM

## 2024-04-16 NOTE — Progress Notes (Signed)
 Clinical Intervention Note  Clinical Intervention Notes: Patient reported starting Entresto , furosemide , metoprolol , potassium, and rosuvastatin . No DDIs identifed with Calquence .   Clinical Intervention Outcomes: Prevention of an adverse drug event   Advertising Account Planner

## 2024-04-16 NOTE — Progress Notes (Signed)
 Specialty Pharmacy Refill Coordination Note  Douglas [name] is a 74 y.o. male contacted today regarding refills of specialty medication(s) Acalabrutinib  Maleate (Calquence )   Patient requested Delivery   Delivery date: 04/19/24   Verified address: [address]  [place]   Medication will be filled on: 04/18/24

## 2024-04-18 ENCOUNTER — Telehealth: Payer: Self-pay

## 2024-04-18 ENCOUNTER — Other Ambulatory Visit: Payer: Self-pay

## 2024-04-18 LAB — CUP PACEART REMOTE DEVICE CHECK
Battery Remaining Longevity: 116 mo
Battery Voltage: 3 V
Brady Statistic AP VP Percent: 1.06 %
Brady Statistic AP VS Percent: 0 %
Brady Statistic AS VP Percent: 97.16 %
Brady Statistic AS VS Percent: 1.42 %
Brady Statistic RA Percent Paced: 0.43 %
Brady Statistic RV Percent Paced: 97.49 %
Date Time Interrogation Session: 20260106000255
Implantable Lead Connection Status: 753985
Implantable Lead Connection Status: 753985
Implantable Lead Implant Date: 20221011
Implantable Lead Implant Date: 20221011
Implantable Lead Location: 753859
Implantable Lead Location: 753860
Implantable Lead Model: 3830
Implantable Lead Model: 5076
Implantable Pulse Generator Implant Date: 20221011
Lead Channel Impedance Value: 323 Ohm
Lead Channel Impedance Value: 380 Ohm
Lead Channel Impedance Value: 437 Ohm
Lead Channel Impedance Value: 532 Ohm
Lead Channel Pacing Threshold Amplitude: 0.625 V
Lead Channel Pacing Threshold Amplitude: 0.75 V
Lead Channel Pacing Threshold Pulse Width: 0.4 ms
Lead Channel Pacing Threshold Pulse Width: 0.4 ms
Lead Channel Sensing Intrinsic Amplitude: 0.625 mV
Lead Channel Sensing Intrinsic Amplitude: 0.625 mV
Lead Channel Sensing Intrinsic Amplitude: 6.5 mV
Lead Channel Sensing Intrinsic Amplitude: 6.5 mV
Lead Channel Setting Pacing Amplitude: 1.5 V
Lead Channel Setting Pacing Amplitude: 2 V
Lead Channel Setting Pacing Pulse Width: 0.4 ms
Lead Channel Setting Sensing Sensitivity: 0.9 mV
Zone Setting Status: 755011
Zone Setting Status: 755011

## 2024-04-18 NOTE — Telephone Encounter (Signed)
 Alert received:  Pacemaker: Scheduled remote reviewed. Normal device function.  Presenting rhythm: AF/VP Ongoing AF since 04/09/2024 AF burden 61%, V-rates controlled  Upon review of Pt's chart, does not appear that he has had previously documented atrial fibrillation.    He has a history of DVT's that was treated with Eliquis , however he developed hematuria and this was discontinued and an IVC filter placed.  Pt has follow up appointment with cardiology on April 23, 2023.  Will advise Pt of above findings and he will discuss further at upcoming appointment.

## 2024-04-18 NOTE — Progress Notes (Signed)
 Remote PPM Transmission

## 2024-04-18 NOTE — Telephone Encounter (Signed)
 Noted. TY!  Fleur Audino S Riley Hallum, NP

## 2024-04-18 NOTE — Telephone Encounter (Signed)
 Outreach made to Pt.  Advised of new Afib noted on recent device transmission.  Advised he would discuss with Reche Finder, NP at follow up visit on Monday.  All questions answered.

## 2024-04-20 ENCOUNTER — Ambulatory Visit: Payer: Self-pay | Admitting: Cardiovascular Disease

## 2024-04-22 ENCOUNTER — Ambulatory Visit (INDEPENDENT_AMBULATORY_CARE_PROVIDER_SITE_OTHER): Admitting: Family

## 2024-04-22 ENCOUNTER — Encounter (HOSPITAL_BASED_OUTPATIENT_CLINIC_OR_DEPARTMENT_OTHER): Payer: Self-pay | Admitting: Family

## 2024-04-22 VITALS — BP 136/70 | HR 61 | Ht 72.0 in

## 2024-04-22 DIAGNOSIS — I10 Essential (primary) hypertension: Secondary | ICD-10-CM | POA: Diagnosis not present

## 2024-04-22 DIAGNOSIS — I739 Peripheral vascular disease, unspecified: Secondary | ICD-10-CM

## 2024-04-22 DIAGNOSIS — E785 Hyperlipidemia, unspecified: Secondary | ICD-10-CM

## 2024-04-22 DIAGNOSIS — I5020 Unspecified systolic (congestive) heart failure: Secondary | ICD-10-CM

## 2024-04-22 DIAGNOSIS — I2510 Atherosclerotic heart disease of native coronary artery without angina pectoris: Secondary | ICD-10-CM

## 2024-04-22 DIAGNOSIS — I480 Paroxysmal atrial fibrillation: Secondary | ICD-10-CM

## 2024-04-22 DIAGNOSIS — Z950 Presence of cardiac pacemaker: Secondary | ICD-10-CM | POA: Diagnosis not present

## 2024-04-22 LAB — BASIC METABOLIC PANEL WITH GFR
BUN/Creatinine Ratio: 23 (ref 10–24)
BUN: 16 mg/dL (ref 8–27)
CO2: 24 mmol/L (ref 20–29)
Calcium: 9.8 mg/dL (ref 8.6–10.2)
Chloride: 97 mmol/L (ref 96–106)
Creatinine, Ser: 0.7 mg/dL — ABNORMAL LOW (ref 0.76–1.27)
Glucose: 121 mg/dL — ABNORMAL HIGH (ref 70–99)
Potassium: 4.8 mmol/L (ref 3.5–5.2)
Sodium: 139 mmol/L (ref 134–144)
eGFR: 96 mL/min/1.73

## 2024-04-22 LAB — PRO B NATRIURETIC PEPTIDE: NT-Pro BNP: 1853 pg/mL — ABNORMAL HIGH (ref 0–486)

## 2024-04-22 LAB — MAGNESIUM: Magnesium: 1.9 mg/dL (ref 1.6–2.3)

## 2024-04-22 NOTE — Progress Notes (Unsigned)
 " Cardiology Office Note   Date:  04/22/2024  ID:  Hamid, Brookens 04/24/1950, MRN [id number] PCP: Kip Righter, MD  [facility] Providers Cardiologist:  Shelda Bruckner, MD Electrophysiologist:  Danelle Birmingham, MD { Click to update primary MD,subspecialty MD or APP then REFRESH:1}    History of Present Illness Isiac Hairo Garraway is a 74 y.o. male with history of CLL, PVD s/p right AKA, HFrEF, BPH, DM 2, RLE DVT and PE s/p IVC filter, BPH, SVT, DM2, s/p PPM, nonobstructive CAD (Prior [name] 2022 with mild 20% LAD stenosis), aortic root dilation.  Prior [name] 2022 with mild 20% LAD stenosis.  Previous DVT's treated with Eliquis  though developed hematuria and OAC discontinued with placement of IVC filter. He was previously admitted 5/16-5/24/23 for hematuria requiring 1uPRBC and IVC filter placement.  Admitted 12/25 - 04/07/24 after presenting with dyspnea X 4 weeks.  Echo newly reduced LVEF 30 to 35%, mild LVH, indeterminate diastolic parameters, RV SF normal, no significant valvular disease, trivial pericardial effusion.  He was IV diuresed -6384 mL.  Discharged on Entresto -51 mg twice daily, metoprolol  succinate 5 mg daily, and 80 mg daily.  SGLT2i deferred due to chronic Foley catheter and risk of UTI.  Hyponatremia noted during hospitalization. Prior to discharge labs 04/05/24 rbc 3.62, hb 14.2, hct 40.1, plt 551.   Scheduled PPM download with ongoing PAF since 04/09/24 with 61% burden, ventricular rate controlled.   Presents today for follow up with his wife. Lengthy discussion of HFrEF, atrial fibrillation. Reports no palpitations, chest pain. Some swelling in his arms/belly but no LE edema. Predominantly wheelchair bound at home as s/p right AKA. Notes dyspnea in the morning that improves later in day. He is concerned that fluid restriction or metformin is leading to sediment in his urine and causing him to leak around his chronic catheter. Reviewed OAC in PAF and  that IVC was not protective from CVA in setting of PAF. He and his wife are very concerned about OAC after 08/2021 admission for hematuria requiring PRBC.  ROS: Please see the history of present illness.    All other systems reviewed and are negative.   Studies Reviewed      Cardiac Studies & Procedures   ______________________________________________________________________________________________ CARDIAC CATHETERIZATION  CARDIAC CATHETERIZATION 01/18/2021  Conclusion   Mid LAD lesion is 20% stenosed.  Mild non-obstructive CAD Normal LV filling pressure  Recommendations: No obvious obstructive CAD. Will ask EP team to see him today to discuss a permanent pacemaker given high grade AV block and near syncope.  Findings Coronary Findings Diagnostic  Dominance: Left  Left Anterior Descending Vessel is large. Mid LAD lesion is 20% stenosed.  Left Circumflex Vessel is large.  Right Coronary Artery Vessel is moderate in size.  Intervention  No interventions have been documented.     ECHOCARDIOGRAM  ECHOCARDIOGRAM COMPLETE 04/05/2024  Narrative ECHOCARDIOGRAM REPORT    Patient Name:   MANDEL SEIDEN Date of Exam: 04/05/2024 Medical Rec #:  [id number]        Height:       72.0 in Accession #:    [id number]       Weight:       233.2 lb Date of Birth:  1951-01-12         BSA:          2.274 m Patient Age:    73 years         BP:           148/71  mmHg Patient Gender: M                HR:           93 bpm. Exam Location:  Inpatient  Procedure: 2D Echo, Cardiac Doppler and Color Doppler (Both Spectral and Color Flow Doppler were utilized during procedure).  Indications:    CHF- Acute Systolic  History:        Patient has prior history of Echocardiogram examinations, most recent 08/26/2021. Risk Factors:Hypertension and Diabetes.  Sonographer:    Sherlean Dubin Referring Phys: [id number] EKTA V PATEL   Sonographer Comments: Image acquisition challenging due to  patient body habitus and Image acquisition challenging due to respiratory motion. IMPRESSIONS   1. Left ventricular ejection fraction, by estimation, is 30 to 35%. The left ventricle has moderately decreased function. Left ventricular endocardial border not optimally defined to evaluate regional wall motion. There is mild left ventricular hypertrophy. Left ventricular diastolic parameters are indeterminate. 2. Right ventricular systolic function is normal. The right ventricular size is normal. Tricuspid regurgitation signal is inadequate for assessing PA pressure. 3. Left atrial size was mildly dilated. 4. Moderate pleural effusion in the left lateral region. 5. The mitral valve is grossly normal. No evidence of mitral valve regurgitation. No evidence of mitral stenosis. 6. The aortic valve is grossly normal. There is mild calcification of the aortic valve. Aortic valve regurgitation is not visualized. Aortic valve sclerosis/calcification is present, without any evidence of aortic stenosis. 7. The inferior vena cava is normal in size with <50% respiratory variability, suggesting right atrial pressure of 8 mmHg.  Conclusion(s)/Recommendation(s): Consider Definity contrast for follow up echocardiograms, wall motion and EF are poorly visualized.  FINDINGS Left Ventricle: Left ventricular ejection fraction, by estimation, is 30 to 35%. The left ventricle has moderately decreased function. Left ventricular endocardial border not optimally defined to evaluate regional wall motion. The left ventricular internal cavity size was normal in size. There is mild left ventricular hypertrophy. Left ventricular diastolic parameters are indeterminate.  Right Ventricle: The right ventricular size is normal. Right vetricular wall thickness was not well visualized. Right ventricular systolic function is normal. Tricuspid regurgitation signal is inadequate for assessing PA pressure. The tricuspid regurgitant velocity  is 1.21 m/s, and with an assumed right atrial pressure of 8 mmHg, the estimated right ventricular systolic pressure is 13.9 mmHg.  Left Atrium: Left atrial size was mildly dilated.  Right Atrium: Right atrial size was normal in size.  Pericardium: Trivial pericardial effusion is present.  Mitral Valve: The mitral valve is grossly normal. No evidence of mitral valve regurgitation. No evidence of mitral valve stenosis.  Tricuspid Valve: The tricuspid valve is not well visualized. Tricuspid valve regurgitation is trivial. No evidence of tricuspid stenosis.  Aortic Valve: The aortic valve is grossly normal. There is mild calcification of the aortic valve. Aortic valve regurgitation is not visualized. Aortic valve sclerosis/calcification is present, without any evidence of aortic stenosis. Aortic valve mean gradient measures 5.5 mmHg. Aortic valve peak gradient measures 9.8 mmHg. Aortic valve area, by VTI measures 3.25 cm.  Pulmonic Valve: The pulmonic valve was normal in structure. Pulmonic valve regurgitation is not visualized. No evidence of pulmonic stenosis.  Aorta: The aortic root is normal in size and structure and the ascending aorta was not well visualized.  Venous: The inferior vena cava is normal in size with less than 50% respiratory variability, suggesting right atrial pressure of 8 mmHg.  IAS/Shunts: No atrial level shunt detected by color flow Doppler.  Additional Comments: A is visualized in the right atrium and right ventricle. There is a moderate pleural effusion in the left lateral region.   LEFT VENTRICLE PLAX 2D LVIDd:         5.50 cm   Diastology LVIDs:         4.86 cm   LV e' medial:    5.44 cm/s LV PW:         1.00 cm   LV E/e' medial:  13.2 LV IVS:        1.30 cm   LV e' lateral:   5.92 cm/s LVOT diam:     2.30 cm   LV E/e' lateral: 12.1 LV SV:         80 LV SV Index:   35 LVOT Area:     4.15 cm   RIGHT VENTRICLE            IVC RV Basal diam:  3.80 cm     IVC diam: 1.70 cm RV Mid diam:    3.40 cm RV S prime:     6.95 cm/s TAPSE (M-mode): 1.9 cm  LEFT ATRIUM             Index        RIGHT ATRIUM           Index LA diam:        3.80 cm 1.67 cm/m   RA Area:     17.30 cm LA Vol (A2C):   68.5 ml 30.12 ml/m  RA Volume:   47.20 ml  20.75 ml/m LA Vol (A4C):   71.2 ml 31.30 ml/m LA Biplane Vol: 71.3 ml 31.35 ml/m AORTIC VALVE AV Area (Vmax):    2.69 cm AV Area (Vmean):   2.64 cm AV Area (VTI):     3.25 cm AV Vmax:           156.50 cm/s AV Vmean:          105.550 cm/s AV VTI:            0.245 m AV Peak Grad:      9.8 mmHg AV Mean Grad:      5.5 mmHg LVOT Vmax:         101.19 cm/s LVOT Vmean:        67.136 cm/s LVOT VTI:          0.191 m LVOT/AV VTI ratio: 0.78  AORTA Ao Root diam: 3.70 cm Ao Asc diam:  3.40 cm  MITRAL VALVE               TRICUSPID VALVE MV Area (PHT): 4.49 cm    TR Peak grad:   5.9 mmHg MV Decel Time: 169 msec    TR Vmax:        121.00 cm/s MV E velocity: 71.70 cm/s MV A velocity: 37.70 cm/s  SHUNTS MV E/A ratio:  1.90        Systemic VTI:  0.19 m Systemic Diam: 2.30 cm  Soyla Merck MD Electronically signed by Soyla Merck MD Signature Date/Time: 04/05/2024/12:48:34 PM    Final          ______________________________________________________________________________________________      Risk Assessment/Calculations  CHA2DS2-VASc Score = 6  {Click here to calculate score.  REFRESH note before signing. :1} This indicates a 9.7% annual risk of stroke. The patient's score is based upon: CHF History: 1 HTN History: 1 Diabetes History: 1 Stroke History: 0 Vascular Disease History: 1 Age Score: 2 Gender  Score: 0   {This patient has a significant risk of stroke if diagnosed with atrial fibrillation.  Please consider VKA or DOAC agent for anticoagulation if the bleeding risk is acceptable.   You can also use the SmartPhrase .[email or web address] for documentation.   :[id number]}         Physical  Exam VS:  BP 136/70 (BP Location: Right Arm, Patient Position: Sitting, Cuff Size: Large)   Pulse 61   Ht 6' (1.829 m)   SpO2 96%   BMI 31.01 kg/m        Wt Readings from Last 3 Encounters:  04/07/24 228 lb 9.9 oz (103.7 kg)  02/12/24 221 lb (100.2 kg)  11/21/23 217 lb (98.4 kg)    GEN: Well nourished, well developed in no acute distress NECK: No JVD; No carotid bruits CARDIAC: ***RRR, no murmurs, rubs, gallops RESPIRATORY:  Clear to auscultation without rales, wheezing or rhonchi  ABDOMEN: Soft, non-tender, non-distended EXTREMITIES:  No edema; No deformity   ASSESSMENT AND PLAN  HFrEF - 04/05/24 LVEF 30-35%. ***  nonobstructive CAD / HLD, LDL goal <70-[name] 2022 mid LAD 20%. *** 04/05/24 LDL 44 ***.   PAF - RRR on auscultation. Recent device download with onset PAF 04/09/25, 61% burden, rate controlled. Continue Toprol  25mg  daily.  Discussed OAC *** 04/05/24 TSH 6.02 (normal T3/T4). ***  HTN - BP not at goal <130/80. Continue lasix  80mg  daily, Entresto  49-51mg  BID. Plan to add Spironolactone  pending BMP today. Discussed to monitor BP at home at least 2 hours after medications and sitting for 5-10 minutes.   S/p PPM - Per EP. *** *** Last seen by EP in clinic 04/30/21. Needs to re-establish given Dr. Adrian retirement.   CLL - on acalabrutinib  per oncology team.   PVD s/p right AKA - per [facility] 07/2021: The left common femoral and profundofemoral artery are patent without significant stenosis.  There is approximately a 15 cm occlusion of the superficial femoral artery with reconstitution of the posterior tibial artery.  There is two-vessel runoff via the posterior tibial and peroneal artery.***he had stenting of left superficial femoral artery with overlapping 6 mm Elluvia stents  Aortic root dilation - 04/04/24 CTA aortic root 4.2 cm. Consider repeat echo in 1 year, can be coordinated at follow up. Continue optimal BP control, as above. Recommend avoidance of fluoroquinolones.         Dispo: follow up in 6 weeks  Signed, Reche GORMAN Finder, NP   "

## 2024-04-22 NOTE — Patient Instructions (Signed)
 Medication Instructions:  Continue your current medications. We will consider addition of MRA (Spironolactone ) based on your lab results.  *If you need a refill on your cardiac medications before your next appointment, please call your pharmacy*  Lab Work: Your physician recommends that you return for lab work today: ProBNP, magnesium , BMP  If you have labs (blood work) drawn today and your tests are completely normal, you will receive your results only by: MyChart Message (if you have MyChart) OR A paper copy in the mail If you have any lab test that is abnormal or we need to change your treatment, we will call you to review the results.  Testing/Procedures: Your physician has requested that you have an echocardiogram in 3 months. Echocardiography is a painless test that uses sound waves to create images of your heart. It provides your doctor with information about the size and shape of your heart and how well your hearts chambers and valves are working. This procedure takes approximately one hour. There are no restrictions for this procedure. Please do NOT wear cologne, perfume, aftershave, or lotions (deodorant is allowed). Please arrive 15 minutes prior to your appointment time.  Please note: We ask at that you not bring children with you during ultrasound (echo/ vascular) testing. Due to room size and safety concerns, children are not allowed in the ultrasound rooms during exams. Our front office staff cannot provide observation of children in our lobby area while testing is being conducted. An adult accompanying a patient to their appointment will only be allowed in the ultrasound room at the discretion of the ultrasound technician under special circumstances. We apologize for any inconvenience.   Follow-Up: At [facility], you and your health needs are our priority.  As part of our continuing mission to provide you with exceptional heart care, our providers are all part of one  team.  This team includes your primary Cardiologist (physician) and Advanced Practice Providers or APPs (Physician Assistants and Nurse Practitioners) who all work together to provide you with the care you need, when you need it.  Your next appointment:   6 weeks with Douglas GORMAN Finder, NP   We recommend signing up for the patient portal called MyChart.  Sign up information is provided on this After Visit Summary.  MyChart is used to connect with patients for Virtual Visits (Telemedicine).  Patients are able to view lab/test results, encounter notes, upcoming appointments, etc.  Non-urgent messages can be sent to your provider as well.   To learn more about what you can do with MyChart, go to [email or web address].   Other Instructions  Your A1c in the hospital was 6.9. Recommend calling [name] to ask about reducing dose if you have concerns.   Recommend fluid restriction to less than 64 oz (2,000 mL) per day if possible to help prevent holding onto fluid.   Recommend low sodium (less than 2,000mg  of sodium per day)  Douglas GORMAN Finder, NP will let you know what the elctrical heart team says about the atrial fibrillation and electrophysiology follow up.

## 2024-04-23 ENCOUNTER — Encounter (HOSPITAL_BASED_OUTPATIENT_CLINIC_OR_DEPARTMENT_OTHER): Payer: Self-pay | Admitting: Family

## 2024-04-23 ENCOUNTER — Ambulatory Visit (HOSPITAL_BASED_OUTPATIENT_CLINIC_OR_DEPARTMENT_OTHER): Payer: Self-pay | Admitting: Family

## 2024-04-23 DIAGNOSIS — I5020 Unspecified systolic (congestive) heart failure: Secondary | ICD-10-CM

## 2024-04-23 DIAGNOSIS — R7989 Other specified abnormal findings of blood chemistry: Secondary | ICD-10-CM

## 2024-04-23 DIAGNOSIS — Z79899 Other long term (current) drug therapy: Secondary | ICD-10-CM

## 2024-04-24 ENCOUNTER — Other Ambulatory Visit (HOSPITAL_BASED_OUTPATIENT_CLINIC_OR_DEPARTMENT_OTHER): Payer: Self-pay

## 2024-04-24 ENCOUNTER — Encounter (HOSPITAL_BASED_OUTPATIENT_CLINIC_OR_DEPARTMENT_OTHER): Payer: Self-pay

## 2024-04-24 DIAGNOSIS — I5020 Unspecified systolic (congestive) heart failure: Secondary | ICD-10-CM

## 2024-04-24 MED ORDER — FUROSEMIDE 80 MG PO TABS: 80.0000 mg | ORAL_TABLET | Freq: Every day | ORAL | 1 refills | Status: AC

## 2024-04-24 MED ORDER — SACUBITRIL-VALSARTAN 49-51 MG PO TABS: 1.0000 | ORAL_TABLET | Freq: Two times a day (BID) | ORAL | 1 refills | Status: AC

## 2024-04-24 MED ORDER — SPIRONOLACTONE 25 MG PO TABS
25.0000 mg | ORAL_TABLET | Freq: Every day | ORAL | 3 refills | Status: AC
  Filled 2024-04-24: qty 90, 90d supply, fill #0

## 2024-04-24 MED ORDER — ROSUVASTATIN CALCIUM 5 MG PO TABS: 5.0000 mg | ORAL_TABLET | Freq: Every day | ORAL | 1 refills | Status: AC

## 2024-04-24 NOTE — Telephone Encounter (Signed)
 The patient has been notified of the result and verbalized understanding.  All questions (if any) were answered.  Pt aware to start taking spironolactone  25 mg po daily and STOP potassium supplement.   Pt aware we will send in refills of his lasix , entresto , and rosuvastatin  to his mail order.  Pt aware that his spironolactone  25 mg daily was sent downstairs to our [facility] pharmacy.   Pt aware to return for repeat BMET in 7 - 10 days.    Pt verbalized understanding and agrees with this plan.  Off note:  Pt wanted to make Reche Finder, NP aware that he has decided to not pursue Watchman Procedure.  He said he has researched this and talked this over with his family, and wants to hold off on pursuing work-up and consultation for Watchman.  Informed the pt that I will make Reche Finder, NP aware of this.  Pt verbalized understanding and agrees with this plan.

## 2024-04-24 NOTE — Telephone Encounter (Signed)
-----   Message from Reche Finder, NP sent at 04/23/2024  8:19 AM EST ----- Normal kidney function and electrolytes. BNP with volume overload.  Rx Spironolactone  25mg  daily Stop Potassium  Refill Lasix  80mg  daily Refill Entresto  49-51mg  BID Refill Rosuvastatin  5mg  daily  Repeat BMP in 7-10 days

## 2024-04-25 ENCOUNTER — Other Ambulatory Visit (HOSPITAL_BASED_OUTPATIENT_CLINIC_OR_DEPARTMENT_OTHER): Payer: Self-pay

## 2024-05-03 LAB — BASIC METABOLIC PANEL WITH GFR
BUN/Creatinine Ratio: 26 — ABNORMAL HIGH (ref 10–24)
BUN: 23 mg/dL (ref 8–27)
CO2: 21 mmol/L (ref 20–29)
Calcium: 9.7 mg/dL (ref 8.6–10.2)
Chloride: 95 mmol/L — ABNORMAL LOW (ref 96–106)
Creatinine, Ser: 0.88 mg/dL (ref 0.76–1.27)
Glucose: 181 mg/dL — ABNORMAL HIGH (ref 70–99)
Sodium: 136 mmol/L (ref 134–144)
eGFR: 90 mL/min/1.73

## 2024-05-13 ENCOUNTER — Other Ambulatory Visit (HOSPITAL_COMMUNITY): Payer: Self-pay

## 2024-05-15 ENCOUNTER — Other Ambulatory Visit (HOSPITAL_COMMUNITY): Payer: Self-pay

## 2024-05-15 ENCOUNTER — Other Ambulatory Visit: Payer: Self-pay | Admitting: Pharmacy Technician

## 2024-05-15 NOTE — Progress Notes (Signed)
 Oral Oncology Patient Advocate Research Scientist (life Sciences) added into [place].  Dyer (Patty) Chet Burnet, CPhT  [facility], Zelda Salmon, [facility] - Oral Chemotherapy Pharmacy Technician III Certified Patient Advocate Phone: [phone]  Fax: [phone]

## 2024-05-16 ENCOUNTER — Other Ambulatory Visit: Payer: Self-pay

## 2024-05-21 ENCOUNTER — Inpatient Hospital Stay: Admitting: Oncology

## 2024-05-21 ENCOUNTER — Inpatient Hospital Stay

## 2024-05-24 ENCOUNTER — Ambulatory Visit (HOSPITAL_BASED_OUTPATIENT_CLINIC_OR_DEPARTMENT_OTHER): Admitting: Family

## 2024-06-04 ENCOUNTER — Ambulatory Visit (HOSPITAL_BASED_OUTPATIENT_CLINIC_OR_DEPARTMENT_OTHER): Admitting: Family

## 2024-06-14 ENCOUNTER — Ambulatory Visit (HOSPITAL_BASED_OUTPATIENT_CLINIC_OR_DEPARTMENT_OTHER): Admitting: Family

## 2024-06-14 ENCOUNTER — Ambulatory Visit: Admitting: Cardiology

## 2024-06-18 ENCOUNTER — Ambulatory Visit (HOSPITAL_BASED_OUTPATIENT_CLINIC_OR_DEPARTMENT_OTHER): Admitting: Family

## 2024-07-10 ENCOUNTER — Other Ambulatory Visit (HOSPITAL_BASED_OUTPATIENT_CLINIC_OR_DEPARTMENT_OTHER)

## 2024-07-25 ENCOUNTER — Other Ambulatory Visit (HOSPITAL_BASED_OUTPATIENT_CLINIC_OR_DEPARTMENT_OTHER)
